# Patient Record
Sex: Female | Born: 1945 | Race: Black or African American | Hispanic: No | Smoking: Never smoker
Health system: Southern US, Community
[De-identification: ages and names within clinical notes are randomized; demographics above are authoritative.]

## PROBLEM LIST (undated history)

## (undated) DIAGNOSIS — C50919 Malignant neoplasm of unspecified site of unspecified female breast: Secondary | ICD-10-CM

## (undated) DIAGNOSIS — I10 Essential (primary) hypertension: Secondary | ICD-10-CM

## (undated) DIAGNOSIS — E785 Hyperlipidemia, unspecified: Secondary | ICD-10-CM

## (undated) DIAGNOSIS — K219 Gastro-esophageal reflux disease without esophagitis: Secondary | ICD-10-CM

## (undated) DIAGNOSIS — M1990 Unspecified osteoarthritis, unspecified site: Secondary | ICD-10-CM

## (undated) DIAGNOSIS — F32A Depression, unspecified: Secondary | ICD-10-CM

## (undated) DIAGNOSIS — E039 Hypothyroidism, unspecified: Secondary | ICD-10-CM

## (undated) HISTORY — PX: CHOLECYSTECTOMY: SHX55

## (undated) HISTORY — PX: OTHER SURGICAL HISTORY: SHX169

---

## 1980-12-06 HISTORY — PX: ABDOMINAL HYSTERECTOMY: SHX81

## 1998-12-15 ENCOUNTER — Ambulatory Visit (HOSPITAL_COMMUNITY): Admission: RE | Admit: 1998-12-15 | Discharge: 1998-12-15 | Payer: Self-pay | Admitting: *Deleted

## 1999-06-10 ENCOUNTER — Ambulatory Visit (HOSPITAL_COMMUNITY): Admission: RE | Admit: 1999-06-10 | Discharge: 1999-06-10 | Payer: Self-pay | Admitting: Gastroenterology

## 1999-12-09 ENCOUNTER — Other Ambulatory Visit: Admission: RE | Admit: 1999-12-09 | Discharge: 1999-12-09 | Payer: Self-pay | Admitting: Obstetrics and Gynecology

## 2000-08-16 ENCOUNTER — Emergency Department (HOSPITAL_COMMUNITY): Admission: EM | Admit: 2000-08-16 | Discharge: 2000-08-16 | Payer: Self-pay | Admitting: Emergency Medicine

## 2000-09-26 ENCOUNTER — Encounter: Payer: Self-pay | Admitting: Gastroenterology

## 2000-09-26 ENCOUNTER — Encounter: Admission: RE | Admit: 2000-09-26 | Discharge: 2000-09-26 | Payer: Self-pay | Admitting: Gastroenterology

## 2000-10-21 ENCOUNTER — Ambulatory Visit (HOSPITAL_COMMUNITY): Admission: RE | Admit: 2000-10-21 | Discharge: 2000-10-21 | Payer: Self-pay | Admitting: Gastroenterology

## 2001-01-03 ENCOUNTER — Other Ambulatory Visit: Admission: RE | Admit: 2001-01-03 | Discharge: 2001-01-03 | Payer: Self-pay | Admitting: Obstetrics and Gynecology

## 2012-12-18 ENCOUNTER — Other Ambulatory Visit (HOSPITAL_COMMUNITY): Payer: Self-pay | Admitting: *Deleted

## 2012-12-19 ENCOUNTER — Encounter (HOSPITAL_COMMUNITY): Payer: Self-pay

## 2017-12-06 HISTORY — PX: OTHER SURGICAL HISTORY: SHX169

## 2018-12-06 DIAGNOSIS — C50412 Malignant neoplasm of upper-outer quadrant of left female breast: Secondary | ICD-10-CM | POA: Insufficient documentation

## 2021-01-20 DIAGNOSIS — Z8673 Personal history of transient ischemic attack (TIA), and cerebral infarction without residual deficits: Secondary | ICD-10-CM | POA: Insufficient documentation

## 2021-01-20 DIAGNOSIS — I10 Essential (primary) hypertension: Secondary | ICD-10-CM | POA: Insufficient documentation

## 2021-01-20 DIAGNOSIS — Z853 Personal history of malignant neoplasm of breast: Secondary | ICD-10-CM | POA: Insufficient documentation

## 2021-01-20 DIAGNOSIS — R002 Palpitations: Secondary | ICD-10-CM | POA: Insufficient documentation

## 2021-02-02 ENCOUNTER — Other Ambulatory Visit: Payer: Self-pay

## 2021-02-02 ENCOUNTER — Emergency Department (HOSPITAL_COMMUNITY): Payer: Medicare PPO

## 2021-02-02 ENCOUNTER — Encounter (HOSPITAL_COMMUNITY): Payer: Self-pay | Admitting: Emergency Medicine

## 2021-02-02 ENCOUNTER — Emergency Department (HOSPITAL_COMMUNITY)
Admission: EM | Admit: 2021-02-02 | Discharge: 2021-02-02 | Disposition: A | Discharge status: Home / Self Care | Payer: Medicare PPO | Attending: Emergency Medicine | Admitting: Emergency Medicine

## 2021-02-02 DIAGNOSIS — E875 Hyperkalemia: Secondary | ICD-10-CM | POA: Diagnosis not present

## 2021-02-02 DIAGNOSIS — Z91040 Latex allergy status: Secondary | ICD-10-CM | POA: Insufficient documentation

## 2021-02-02 DIAGNOSIS — I10 Essential (primary) hypertension: Secondary | ICD-10-CM

## 2021-02-02 DIAGNOSIS — R519 Headache, unspecified: Secondary | ICD-10-CM | POA: Insufficient documentation

## 2021-02-02 DIAGNOSIS — Z79899 Other long term (current) drug therapy: Secondary | ICD-10-CM | POA: Diagnosis not present

## 2021-02-02 HISTORY — DX: Essential (primary) hypertension: I10

## 2021-02-02 LAB — COMPREHENSIVE METABOLIC PANEL
ALT: 26 U/L (ref 0–44)
AST: 52 U/L — ABNORMAL HIGH (ref 15–41)
Albumin: 3.8 g/dL (ref 3.5–5.0)
Alkaline Phosphatase: 51 U/L (ref 38–126)
Anion gap: 12 (ref 5–15)
BUN: 12 mg/dL (ref 8–23)
CO2: 21 mmol/L — ABNORMAL LOW (ref 22–32)
Calcium: 9.1 mg/dL (ref 8.9–10.3)
Chloride: 101 mmol/L (ref 98–111)
Creatinine, Ser: 1.01 mg/dL — ABNORMAL HIGH (ref 0.44–1.00)
GFR, Estimated: 58 mL/min — ABNORMAL LOW (ref >60)
Glucose, Bld: 96 mg/dL (ref 70–99)
Potassium: 5.2 mmol/L — ABNORMAL HIGH (ref 3.5–5.1)
Sodium: 134 mmol/L — ABNORMAL LOW (ref 135–145)
Total Bilirubin: 1.4 mg/dL — ABNORMAL HIGH (ref 0.3–1.2)
Total Protein: 7.1 g/dL (ref 6.5–8.1)

## 2021-02-02 LAB — CBC WITH DIFFERENTIAL/PLATELET
Abs Immature Granulocytes: 0.03 10*3/uL (ref 0.00–0.07)
Basophils Absolute: 0 10*3/uL (ref 0.0–0.1)
Basophils Relative: 0 %
Eosinophils Absolute: 0.2 10*3/uL (ref 0.0–0.5)
Eosinophils Relative: 2 %
HCT: 34.8 % — ABNORMAL LOW (ref 36.0–46.0)
Hemoglobin: 11.9 g/dL — ABNORMAL LOW (ref 12.0–15.0)
Immature Granulocytes: 0 %
Lymphocytes Relative: 12 %
Lymphs Abs: 1.2 10*3/uL (ref 0.7–4.0)
MCH: 29.8 pg (ref 26.0–34.0)
MCHC: 34.2 g/dL (ref 30.0–36.0)
MCV: 87.2 fL (ref 80.0–100.0)
Monocytes Absolute: 0.9 10*3/uL (ref 0.1–1.0)
Monocytes Relative: 9 %
Neutro Abs: 7.7 10*3/uL (ref 1.7–7.7)
Neutrophils Relative %: 77 %
Platelets: 192 10*3/uL (ref 150–400)
RBC: 3.99 MIL/uL (ref 3.87–5.11)
RDW: 13.7 % (ref 11.5–15.5)
WBC: 10 10*3/uL (ref 4.0–10.5)
nRBC: 0 % (ref 0.0–0.2)

## 2021-02-02 LAB — URINALYSIS, ROUTINE W REFLEX MICROSCOPIC
Bilirubin Urine: NEGATIVE
Glucose, UA: NEGATIVE mg/dL
Hgb urine dipstick: NEGATIVE
Ketones, ur: NEGATIVE mg/dL
Leukocytes,Ua: NEGATIVE
Nitrite: NEGATIVE
Protein, ur: NEGATIVE mg/dL
Specific Gravity, Urine: 1.005 (ref 1.005–1.030)
pH: 7 (ref 5.0–8.0)

## 2021-02-02 MED ORDER — LABETALOL HCL 5 MG/ML IV SOLN
10.0000 mg | Freq: Once | INTRAVENOUS | Status: AC
  Administered 2021-02-02: 10 mg via INTRAVENOUS
  Filled 2021-02-02: qty 4

## 2021-02-02 MED ORDER — ONDANSETRON HCL 4 MG/2ML IJ SOLN
4.0000 mg | Freq: Once | INTRAMUSCULAR | Status: AC
  Administered 2021-02-02: 4 mg via INTRAVENOUS
  Filled 2021-02-02: qty 2

## 2021-02-02 MED ORDER — SODIUM CHLORIDE 0.9 % IV BOLUS
500.0000 mL | Freq: Once | INTRAVENOUS | Status: AC
  Administered 2021-02-02: 500 mL via INTRAVENOUS

## 2021-02-02 MED ORDER — METOPROLOL SUCCINATE ER 50 MG PO TB24: 50.0000 mg | ORAL_TABLET | Freq: Every day | ORAL | 0 refills | Status: DC

## 2021-02-02 MED ORDER — ACETAMINOPHEN 500 MG PO TABS
1000.0000 mg | ORAL_TABLET | Freq: Once | ORAL | Status: AC
  Administered 2021-02-02: 1000 mg via ORAL
  Filled 2021-02-02: qty 2

## 2021-02-02 MED ORDER — LABETALOL HCL 5 MG/ML IV SOLN
20.0000 mg | Freq: Once | INTRAVENOUS | Status: AC
  Administered 2021-02-02: 20 mg via INTRAVENOUS
  Filled 2021-02-02: qty 4

## 2021-02-02 NOTE — ED Triage Notes (Signed)
Patient coming from home. Endorses increased blood pressure for a few days. States that she does take BP medications at home. NAD.

## 2021-02-02 NOTE — ED Provider Notes (Signed)
[facility] EMERGENCY DEPARTMENT Provider Note   CSN: [id number] Arrival date & time: 02/02/21  5027     History Chief Complaint  Patient presents with  . Hypertension    Cindy Huerta is a 75 y.o. female with PMHx HTN who presents to the ED today with complaint of gradual onset, constant, throbbing, diffuse headache x 2-3 days. Son is at bedside - reports that pt went and saw a new PCP last week who wanted to get her blood pressure under more control as it was elevated in the 741O systolic. Son is unsure however if pt was started on a new medication or had her medications increased. He states that over the weekend pt began complaining of a headache that has been unrelieved with Tylenol; last took 1,000 mg Tylenol earlier this morning around 5 AM. Pt has also been vomiting. Son states that pt's blood pressure has continued to rise prompting concern - they were supposed to call the PCP this morning to discuss what her numbers looked like over the weekend however decided to come to the ED instead. Son does mention that pt's speech seems slower than normal however he states this typically happens when she begins having a headache - LKN before lunch time yesterday. Pt denies fevers, chills, neck stiffness, rash, unilateral weakness or numbness, facial droop, vision changes, or any other associated symptoms.   Per paperwork brought by son: Pt was seen at PCP's office on 2/19 with blood pressure 160/80. She was taken off of Cartia xr, Lisinopril, and triamterine hctz and changed to metoprolol 25 mg with her regular dose of 80 mg telmisartan.    The history is provided by the patient, a relative and medical records.       Past Medical History:  Diagnosis Date  . Hypertension     There are no problems to display for this patient.   History reviewed. No pertinent surgical history.   OB History   No obstetric history on file.     No family history on file.      Home Medications Prior to Admission medications   Medication Sig Start Date End Date Taking? Authorizing Provider  acetaminophen (TYLENOL) 500 MG tablet Take 500-1,000 mg by mouth as needed for pain.   Yes [provider]  aspirin 81 MG EC tablet Take 81 mg by mouth daily.   Yes [provider]  atorvastatin (LIPITOR) 80 MG tablet Take 80 mg by mouth daily. 01/15/20  Yes [provider]  Cholecalciferol 25 MCG (1000 UT) capsule Take 1,000 Units by mouth daily.   Yes [provider]  clopidogrel (PLAVIX) 75 MG tablet Take 75 mg by mouth daily. 11/23/19  Yes [provider]  Cyanocobalamin (VITAMIN B-12 PO) Take 1 tablet by mouth daily.   Yes [provider]  fexofenadine (ALLEGRA) 180 MG tablet Take 180 mg by mouth as needed for allergies.   Yes [provider]  levothyroxine (SYNTHROID) 88 MCG tablet Take 88 mcg by mouth every morning. 12/03/19  Yes [provider]  magnesium oxide (MAG-OX) 400 MG tablet Take 400 mg by mouth 2 (two) times daily.   Yes [provider]  meclizine (ANTIVERT) 12.5 MG tablet Take 12.5 mg by mouth as needed for dizziness.   Yes [provider]  metoprolol succinate (TOPROL XL) 50 MG 24 hr tablet Take 1 tablet (50 mg total) by mouth daily for 7 days. Take with or immediately following a meal. 02/02/21 02/09/21  Yes Alroy Bailiff, Margaux, PA-C  metoprolol succinate (TOPROL-XL) 25 MG 24 hr tablet Take 25 mg by mouth daily. 01/20/21  Yes [provider]  Multiple Vitamins-Minerals (THERA-M) TABS Take 1 tablet by mouth daily.   Yes [provider]  ondansetron (ZOFRAN-ODT) 4 MG disintegrating tablet Take 4 mg by mouth as needed for nausea/vomiting. 12/16/20  Yes [provider]  pantoprazole (PROTONIX) 40 MG tablet Take 40 mg by mouth daily.   Yes [provider]  potassium chloride SA (KLOR-CON) 20 MEQ tablet Take 20 mEq by mouth daily.   Yes [provider]  telmisartan (MICARDIS) 80 MG tablet Take 80 mg by mouth daily. 10/06/18  Yes [provider]    Allergies    Latex, Lisinopril, Procaine, Diphenhydramine hcl, Clarithromycin, and Oxycodone  Review of Systems   Review of Systems  Constitutional: Negative for chills and fever.  Eyes: Negative for visual disturbance.  Gastrointestinal: Positive for nausea and vomiting. Negative for abdominal pain.  Musculoskeletal: Negative for neck pain and neck stiffness.  Skin: Negative for rash.  Neurological: Positive for speech difficulty and headaches. Negative for syncope.  All other systems reviewed and are negative.   Physical Exam Updated Vital Signs BP (!) 189/80 (BP Location: Right Arm)   Pulse 80   Temp 97.8 F (36.6 C) (Oral)   Resp 18   Ht 5\' 2"  (1.575 m)   Wt 79.4 kg   SpO2 99%   BMI 32.01 kg/m   Physical Exam Vitals and nursing note reviewed.  Constitutional:      Appearance: She is not ill-appearing.  HENT:     Head: Normocephalic and atraumatic.  Eyes:     Extraocular Movements: Extraocular movements intact.     Conjunctiva/sclera: Conjunctivae normal.     Pupils: Pupils are equal, round, and reactive to light.  Cardiovascular:     Rate and Rhythm: Normal rate and regular rhythm.     Pulses: Normal pulses.  Pulmonary:     Effort: Pulmonary effort is normal.     Breath sounds: Normal breath sounds. No wheezing, rhonchi or rales.  Abdominal:     Palpations: Abdomen is soft.     Tenderness: There is no abdominal tenderness. There is no guarding or rebound.  Musculoskeletal:     Cervical back: Neck supple. No rigidity.  Skin:    General: Skin is warm and dry.  Neurological:     Mental Status: She is alert.     Comments: Alert and oriented to self, place, time and event.   Speech is slowed.  Strength 5/5 in upper/lower extremities  Sensation intact in upper/lower extremities   Negative Romberg. No pronator drift.  Normal  finger-to-nose and feet tapping.  CN I not tested  CN II grossly intact visual fields bilaterally. Did not visualize posterior eye.   CN III, IV, VI PERRLA and EOMs intact bilaterally  CN V Intact sensation to sharp and light touch to the face  CN VII facial movements symmetric  CN VIII not tested  CN IX, X no uvula deviation, symmetric rise of soft palate  CN XI 5/5 SCM and trapezius strength bilaterally  CN XII Midline tongue protrusion, symmetric L/R movements      ED Results / Procedures / Treatments   Labs (all labs ordered are listed, but only abnormal results are displayed) Labs Reviewed  COMPREHENSIVE METABOLIC PANEL - Abnormal; Notable for the following components:      Result Value   Sodium 134 (*)  Potassium 5.2 (*)    CO2 21 (*)    Creatinine, Ser 1.01 (*)    AST 52 (*)    Total Bilirubin 1.4 (*)    GFR, Estimated 58 (*)    All other components within normal limits  URINALYSIS, ROUTINE W REFLEX MICROSCOPIC - Abnormal; Notable for the following components:   Color, Urine STRAW (*)    All other components within normal limits  CBC WITH DIFFERENTIAL/PLATELET - Abnormal; Notable for the following components:   Hemoglobin 11.9 (*)    HCT 34.8 (*)    All other components within normal limits  CBC WITH DIFFERENTIAL/PLATELET    EKG EKG Interpretation  Date/Time:  Monday February 02 2021 09:19:39 EST Ventricular Rate:  63 PR Interval:    QRS Duration: 93 QT Interval:  419 QTC Calculation: 433 R Axis:   8 Text Interpretation: Sinus rhythm Low voltage, precordial leads Borderline T abnormalities, anterior leads Confirmed by Elnora Morrison [phone]) on 02/02/2021 9:49:27 AM   Radiology CT Head Wo Contrast  Result Date: 02/02/2021 CLINICAL DATA:  Headache, hypertension EXAM: CT HEAD WITHOUT CONTRAST TECHNIQUE: Contiguous axial images were obtained from the base of the skull through the vertex without intravenous contrast. COMPARISON:  None. FINDINGS: Brain: No  evidence of acute infarction, hemorrhage, hydrocephalus, extra-axial collection or mass lesion/mass effect. 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 Mild low-density changes within the periventricular and subcortical white matter compatible with chronic microvascular ischemic change. Mild diffuse cerebral volume loss. Vascular: Atherosclerotic calcifications involving the large vessels of the skull base. No unexpected hyperdense vessel. Skull: Normal. Negative for fracture or focal lesion. Sinuses/Orbits: No acute finding. Other: None. IMPRESSION: 1. No acute intracranial findings. 2. Mild chronic microvascular ischemic change and cerebral volume loss. Electronically Signed   By: Davina Poke D.O.   On: 02/02/2021 11:21    Procedures Procedures   Medications Ordered in ED Medications  sodium chloride 0.9 % bolus 500 mL (0 mLs Intravenous Stopped 02/02/21 1208)  ondansetron (ZOFRAN) injection 4 mg (4 mg Intravenous Given 02/02/21 1051)  acetaminophen (TYLENOL) tablet 1,000 mg (1,000 mg Oral Given 02/02/21 1051)  labetalol (NORMODYNE) injection 10 mg (10 mg Intravenous Given 02/02/21 1048)  labetalol (NORMODYNE) injection 20 mg (20 mg Intravenous Given 02/02/21 1251)    ED Course  I have reviewed the triage vital signs and the nursing notes.  Pertinent labs & imaging results that were available during my care of the patient were reviewed by me and considered in my medical decision making (see chart for details).    MDM Rules/Calculators/A&P                          75 year old female presenting to the ED today with complaint of throbbing headache and elevated blood pressure readings with associated nausea and NBNB emesis this past weekend. Son has been giving Tylenol without relief, brought pt in today for further evaluation. On arrival to the ED BP elevated 189/80; remainder of vital signs are stable. Pt's speech does appear slowed at this time - son reports this typically will  happen when her blood pressure gets elevated. LKN prior to lunch time yesterday; no other signs of acute CVA per son. On exam pt has no focal neuro deficits on exam. She is following commands without difficulty however given age and HA with elevated BP will plan for CT Head. Will also obtain labs and provide small amount of fluids with 1  g Tylenol, 10 mg Labetalol, and 4 mg Zofran. Discussed case with attending physician Dr. Reather Converse who agrees with plan.   CBC without leukocytosis. Hgb stable at 11.9 CMP with potassium 5.2; pt does take 20 mEq KDUR daily which I suspect is the cause of her hyperkalemia today. Sodium 134, bicarb 21. Creatinine slightly elevated at 1.01; do not have previous to compare to U/A negative for infection  CT Head negative  On reevaluation pt reports her headache is somewhat improved however blood pressure continues to be elevated now 199/64. Will provide an additional 20 mg Labetalol and reevaluate. Pt at first told me she did take her morning dose of BP meds today however now she is unsure.   Blood pressure after additional labetalol 166/73 which is around pt's baseline 2 weeks ago when her BP meds were changed. On reevaluation pt resting comfortably; reports she has been able to sleep some due to her headache having relief. She has also been able to tolerate PO without vomiting. Pt has been able to ambulate to the restroom with steady gait as well. Will plan to discharge home at this time with close PCP eval by the end of the week. I do think pt's BP meds need to be increased as she is only on 25 mg metoprolol daily; have discussed this with attending physician Dr. Reather Converse who agrees to increase metoprolol until pt can see her PCP this week. Have discussed with pt's son who is in agreement with plan. Pt stable for discharge.   This note was prepared using Dragon voice recognition software and may include unintentional dictation errors due to the inherent limitations of voice  recognition software.   Final Clinical Impression(s) / ED Diagnoses Final diagnoses:  Primary hypertension  Acute nonintractable headache, unspecified headache type  Hyperkalemia    Rx / DC Orders ED Discharge Orders         Ordered    metoprolol succinate (TOPROL XL) 50 MG 24 hr tablet  Daily        02/02/21 1424           Discharge Instructions     Please pick up new blood pressure medication and take as prescribed. Keep checking your blood pressures daily and keep a log.   Stop taking your potassium supplement [facility]) for the next week until you can see your PCP. You will need to see your PCP by the end of the week for blood pressure reevaluation and to have your potassium level rechecked as well.   Drink plenty of fluids to stay hydrated. Take Tylenol as needed if your headache returns.   Return to the ED IMMEDIATELY for any worsening symptoms       Eustaquio Maize, PA-C 02/02/21 1426    Elnora Morrison, MD 02/03/21 [phone]

## 2021-02-02 NOTE — Discharge Instructions (Addendum)
Please pick up new blood pressure medication and take as prescribed. Keep checking your blood pressures daily and keep a log.   Stop taking your potassium supplement [facility]) for the next week until you can see your PCP. You will need to see your PCP by the end of the week for blood pressure reevaluation and to have your potassium level rechecked as well.   Drink plenty of fluids to stay hydrated. Take Tylenol as needed if your headache returns.   Return to the ED IMMEDIATELY for any worsening symptoms

## 2021-02-24 ENCOUNTER — Other Ambulatory Visit: Payer: Self-pay

## 2021-02-24 ENCOUNTER — Emergency Department
Admission: EM | Admit: 2021-02-24 | Discharge: 2021-02-24 | Disposition: A | Discharge status: Home / Self Care | Payer: Medicare PPO | Attending: Emergency Medicine | Admitting: Emergency Medicine

## 2021-02-24 DIAGNOSIS — Z7982 Long term (current) use of aspirin: Secondary | ICD-10-CM | POA: Diagnosis not present

## 2021-02-24 DIAGNOSIS — Z79899 Other long term (current) drug therapy: Secondary | ICD-10-CM | POA: Diagnosis not present

## 2021-02-24 DIAGNOSIS — I10 Essential (primary) hypertension: Secondary | ICD-10-CM | POA: Insufficient documentation

## 2021-02-24 DIAGNOSIS — R519 Headache, unspecified: Secondary | ICD-10-CM | POA: Diagnosis present

## 2021-02-24 DIAGNOSIS — Z91040 Latex allergy status: Secondary | ICD-10-CM | POA: Insufficient documentation

## 2021-02-24 DIAGNOSIS — Z7902 Long term (current) use of antithrombotics/antiplatelets: Secondary | ICD-10-CM | POA: Diagnosis not present

## 2021-02-24 LAB — CBC WITH DIFFERENTIAL/PLATELET
Abs Immature Granulocytes: 0.04 10*3/uL (ref 0.00–0.07)
Basophils Absolute: 0 10*3/uL (ref 0.0–0.1)
Basophils Relative: 0 %
Eosinophils Absolute: 0.1 10*3/uL (ref 0.0–0.5)
Eosinophils Relative: 1 %
HCT: 37.8 % (ref 36.0–46.0)
Hemoglobin: 12.6 g/dL (ref 12.0–15.0)
Immature Granulocytes: 0 %
Lymphocytes Relative: 13 %
Lymphs Abs: 1.5 10*3/uL (ref 0.7–4.0)
MCH: 29.4 pg (ref 26.0–34.0)
MCHC: 33.3 g/dL (ref 30.0–36.0)
MCV: 88.3 fL (ref 80.0–100.0)
Monocytes Absolute: 1 10*3/uL (ref 0.1–1.0)
Monocytes Relative: 8 %
Neutro Abs: 8.8 10*3/uL — ABNORMAL HIGH (ref 1.7–7.7)
Neutrophils Relative %: 78 %
Platelets: 244 10*3/uL (ref 150–400)
RBC: 4.28 MIL/uL (ref 3.87–5.11)
RDW: 13.6 % (ref 11.5–15.5)
WBC: 11.5 10*3/uL — ABNORMAL HIGH (ref 4.0–10.5)
nRBC: 0 % (ref 0.0–0.2)

## 2021-02-24 LAB — BASIC METABOLIC PANEL
Anion gap: 10 (ref 5–15)
BUN: 10 mg/dL (ref 8–23)
CO2: 27 mmol/L (ref 22–32)
Calcium: 9.4 mg/dL (ref 8.9–10.3)
Chloride: 100 mmol/L (ref 98–111)
Creatinine, Ser: 0.97 mg/dL (ref 0.44–1.00)
GFR, Estimated: >60 mL/min (ref >60)
Glucose, Bld: 99 mg/dL (ref 70–99)
Potassium: 3.5 mmol/L (ref 3.5–5.1)
Sodium: 137 mmol/L (ref 135–145)

## 2021-02-24 LAB — TROPONIN I (HIGH SENSITIVITY): Troponin I (High Sensitivity): 7 ng/L (ref <18)

## 2021-02-24 MED ORDER — LORAZEPAM 2 MG/ML IJ SOLN: 1.0000 mg | Freq: Once | INTRAMUSCULAR | Status: DC

## 2021-02-24 MED ORDER — LORAZEPAM 2 MG/ML IJ SOLN
1.0000 mg | Freq: Once | INTRAMUSCULAR | Status: AC
  Administered 2021-02-24: 1 mg via INTRAVENOUS
  Filled 2021-02-24: qty 1

## 2021-02-24 NOTE — ED Provider Notes (Signed)
[facility] Emergency Department Provider Note   ____________________________________________   Event Date/Time   First MD Initiated Contact with Patient 02/24/21 1021     (approximate)  I have reviewed the triage vital signs and the nursing notes.   HISTORY  Chief Complaint Headache and Hypertension    HPI Cindy Huerta is a 75 y.o. female with a stated past medical history of hypertension and anxiety who presents for hypertension and a posterior headache that began approximately 3 days prior to arrival.  Patient states that she took her blood pressure earlier this morning and was concerned as it was elevated with her systolics in the 947S.  Patient describes this posterior headache as an aching/throbbing, 10/10, nonradiating pain that is worse when her blood pressure is elevated.  Patient currently denies any vision changes, tinnitus, difficulty speaking, facial droop, sore throat, chest pain, shortness of breath, abdominal pain, nausea/vomiting/diarrhea, dysuria, or weakness/numbness/paresthesias in any extremity         Past Medical History:  Diagnosis Date  . Hypertension     There are no problems to display for this patient.   History reviewed. No pertinent surgical history.  Prior to Admission medications   Medication Sig Start Date End Date Taking? Authorizing Provider  acetaminophen (TYLENOL) 500 MG tablet Take 500-1,000 mg by mouth as needed for pain.    [provider]  aspirin 81 MG EC tablet Take 81 mg by mouth daily.    [provider]  atorvastatin (LIPITOR) 80 MG tablet Take 80 mg by mouth daily. 01/15/20   [provider]  Cholecalciferol 25 MCG (1000 UT) capsule Take 1,000 Units by mouth daily.    [provider]  clopidogrel (PLAVIX) 75 MG tablet Take 75 mg by mouth daily. 11/23/19   [provider]  Cyanocobalamin (VITAMIN B-12 PO) Take 1 tablet by mouth daily.    [provider]  fexofenadine (ALLEGRA) 180 MG tablet Take 180 mg by mouth as needed for allergies.    [provider]  levothyroxine (SYNTHROID) 88 MCG tablet Take 88 mcg by mouth every morning. 12/03/19   [provider]  magnesium oxide (MAG-OX) 400 MG tablet Take 400 mg by mouth 2 (two) times daily.    [provider]  meclizine (ANTIVERT) 12.5 MG tablet Take 12.5 mg by mouth as needed for dizziness.    [provider]  metoprolol succinate (TOPROL XL) 50 MG 24 hr tablet Take 1 tablet (50 mg total) by mouth daily for 7 days. Take with or immediately following a meal. 02/02/21 02/09/21  Alroy Bailiff, Margaux, PA-C  metoprolol succinate (TOPROL-XL) 25 MG 24 hr tablet Take 25 mg by mouth daily. 01/20/21   [provider]  Multiple Vitamins-Minerals (THERA-M) TABS Take 1 tablet by mouth daily.    [provider]  ondansetron (ZOFRAN-ODT) 4 MG disintegrating tablet Take 4 mg by mouth as needed for nausea/vomiting. 12/16/20   [provider]  pantoprazole (PROTONIX) 40 MG tablet Take 40 mg by mouth daily.    [provider]  potassium chloride SA (KLOR-CON) 20 MEQ tablet Take 20 mEq by mouth daily.    [provider]  telmisartan (MICARDIS) 80 MG tablet Take 80 mg by mouth daily. 10/06/18   [provider]    Allergies Latex, Lisinopril, Procaine, Diphenhydramine hcl, Clarithromycin, and Oxycodone  History reviewed. No pertinent family history.  Social History Social History   Tobacco Use  . Smoking status: Never Smoker  .  Smokeless tobacco: Never Used    Review of Systems Constitutional: No fever/chills Eyes: No visual changes. ENT: No sore throat. Cardiovascular: Denies chest pain. Respiratory: Denies shortness of breath. Gastrointestinal: No abdominal pain.  No nausea, no vomiting.  No diarrhea. Genitourinary: Negative for dysuria. Musculoskeletal: Negative for acute arthralgias Skin: Negative for  rash. Neurological: Positive for headaches, negative for weakness/numbness/paresthesias in any extremity Psychiatric: Negative for suicidal ideation/homicidal ideation   ____________________________________________   PHYSICAL EXAM:  VITAL SIGNS: ED Triage Vitals  Enc Vitals Group     BP 02/24/21 1018 (!) 205/103     Pulse Rate 02/24/21 1018 63     Resp 02/24/21 1018 20     Temp 02/24/21 1018 98.6 F (37 C)     Temp Source 02/24/21 1018 Oral     SpO2 02/24/21 1018 98 %     Weight 02/24/21 1014 180 lb (81.6 kg)     Height 02/24/21 1014 5\' 7"  (1.702 m)     Head Circumference --      Peak Flow --      Pain Score 02/24/21 1013 10     Pain Loc --      Pain Edu? --      Excl. in [place]? --    Constitutional: Alert and oriented. Well appearing and in no acute distress. Eyes: Conjunctivae are normal. PERRL. Head: Atraumatic. Nose: No congestion/rhinnorhea. Mouth/Throat: Mucous membranes are moist. Neck: No stridor Cardiovascular: Grossly normal heart sounds.  Good peripheral circulation. Respiratory: Normal respiratory effort.  No retractions. Gastrointestinal: Soft and nontender. No distention. Musculoskeletal: No obvious deformities Neurologic:  Normal speech and language. No gross focal neurologic deficits are appreciated. Skin:  Skin is warm and dry. No rash noted. Psychiatric: Mood and affect are normal. Speech and behavior are normal.  ____________________________________________   LABS (all labs ordered are listed, but only abnormal results are displayed)  Labs Reviewed  CBC WITH DIFFERENTIAL/PLATELET - Abnormal; Notable for the following components:      Result Value   WBC 11.5 (*)    Neutro Abs 8.8 (*)    All other components within normal limits  BASIC METABOLIC PANEL  TROPONIN I (HIGH SENSITIVITY)  TROPONIN I (HIGH SENSITIVITY)   ____________________________________________  EKG  ED ECG REPORT I, Naaman Plummer, the attending physician, personally viewed  and interpreted this ECG.  Date: 02/24/2021 EKG Time: 1018 Rate: 65 Rhythm: normal sinus rhythm QRS Axis: normal Intervals: normal ST/T Wave abnormalities: normal Narrative Interpretation: no evidence of acute ischemia   PROCEDURES  Procedure(s) performed (including Critical Care):  .1-3 Lead EKG Interpretation Performed by: Naaman Plummer, MD Authorized by: Naaman Plummer, MD     Interpretation: normal     ECG rate:  68   ECG rate assessment: normal     Rhythm: sinus rhythm     Ectopy: none     Conduction: normal       ____________________________________________   INITIAL IMPRESSION / ASSESSMENT AND PLAN / ED COURSE  As part of my medical decision making, I reviewed the following data within the [place] notes reviewed and incorporated, Labs reviewed, EKG interpreted, Old chart reviewed, and Notes from prior ED visits reviewed and incorporated      Presents to the emergency department complaining of high blood pressure. Patient is otherwise asymptomatic without confusion, chest pain, hematuria, or SOB. - Nonadherence to antihypertensive regimen DDx: CV, AMI, heart failure, renal infarction or failure or other end organ damage.  Disposition:  Discussed with patient their elevated blood pressure and need for close outpatient management of their hypertension. Will provide a prescription for the patients previous antihypertensive medication and arrange for the patient to follow up in a primary care clinic      ____________________________________________   FINAL CLINICAL IMPRESSION(S) / ED DIAGNOSES  Final diagnoses:  Primary hypertension  Nonintractable episodic headache, unspecified headache type     ED Discharge Orders    None       Note:  This document was prepared using Dragon voice recognition software and may include unintentional dictation errors.   Naaman Plummer, MD 02/24/21 [phone]

## 2021-02-24 NOTE — ED Triage Notes (Signed)
Pt here for HA, HTN, and anxiety. Pt bp was elevated at her primary's. Pt has severe anxiety. Pt A&O on arrival to ED.

## 2021-02-24 NOTE — ED Notes (Signed)
Pt educated on discharge and wheeled to lobby to await son.

## 2021-02-28 ENCOUNTER — Emergency Department
Admission: EM | Admit: 2021-02-28 | Discharge: 2021-02-28 | Disposition: A | Discharge status: Home / Self Care | Payer: Medicare PPO | Attending: Emergency Medicine | Admitting: Emergency Medicine

## 2021-02-28 ENCOUNTER — Other Ambulatory Visit: Payer: Self-pay

## 2021-02-28 DIAGNOSIS — Z7982 Long term (current) use of aspirin: Secondary | ICD-10-CM | POA: Insufficient documentation

## 2021-02-28 DIAGNOSIS — K625 Hemorrhage of anus and rectum: Secondary | ICD-10-CM

## 2021-02-28 DIAGNOSIS — Z79899 Other long term (current) drug therapy: Secondary | ICD-10-CM | POA: Insufficient documentation

## 2021-02-28 DIAGNOSIS — K648 Other hemorrhoids: Secondary | ICD-10-CM | POA: Insufficient documentation

## 2021-02-28 DIAGNOSIS — K644 Residual hemorrhoidal skin tags: Secondary | ICD-10-CM

## 2021-02-28 DIAGNOSIS — Z91040 Latex allergy status: Secondary | ICD-10-CM | POA: Diagnosis not present

## 2021-02-28 DIAGNOSIS — R110 Nausea: Secondary | ICD-10-CM | POA: Diagnosis not present

## 2021-02-28 DIAGNOSIS — I10 Essential (primary) hypertension: Secondary | ICD-10-CM | POA: Diagnosis not present

## 2021-02-28 LAB — CBC WITH DIFFERENTIAL/PLATELET
Abs Immature Granulocytes: 0.06 10*3/uL (ref 0.00–0.07)
Basophils Absolute: 0.1 10*3/uL (ref 0.0–0.1)
Basophils Relative: 0 %
Eosinophils Absolute: 0.2 10*3/uL (ref 0.0–0.5)
Eosinophils Relative: 1 %
HCT: 35.8 % — ABNORMAL LOW (ref 36.0–46.0)
Hemoglobin: 12.1 g/dL (ref 12.0–15.0)
Immature Granulocytes: 1 %
Lymphocytes Relative: 13 %
Lymphs Abs: 1.6 10*3/uL (ref 0.7–4.0)
MCH: 29.6 pg (ref 26.0–34.0)
MCHC: 33.8 g/dL (ref 30.0–36.0)
MCV: 87.5 fL (ref 80.0–100.0)
Monocytes Absolute: 0.9 10*3/uL (ref 0.1–1.0)
Monocytes Relative: 7 %
Neutro Abs: 10.3 10*3/uL — ABNORMAL HIGH (ref 1.7–7.7)
Neutrophils Relative %: 78 %
Platelets: 197 10*3/uL (ref 150–400)
RBC: 4.09 MIL/uL (ref 3.87–5.11)
RDW: 13.8 % (ref 11.5–15.5)
WBC: 13.1 10*3/uL — ABNORMAL HIGH (ref 4.0–10.5)
nRBC: 0 % (ref 0.0–0.2)

## 2021-02-28 LAB — TYPE AND SCREEN
ABO/RH(D): AB POS
Antibody Screen: NEGATIVE

## 2021-02-28 LAB — COMPREHENSIVE METABOLIC PANEL
ALT: 14 U/L (ref 0–44)
AST: 22 U/L (ref 15–41)
Albumin: 4.1 g/dL (ref 3.5–5.0)
Alkaline Phosphatase: 52 U/L (ref 38–126)
Anion gap: 9 (ref 5–15)
BUN: 11 mg/dL (ref 8–23)
CO2: 26 mmol/L (ref 22–32)
Calcium: 9.3 mg/dL (ref 8.9–10.3)
Chloride: 102 mmol/L (ref 98–111)
Creatinine, Ser: 0.96 mg/dL (ref 0.44–1.00)
GFR, Estimated: >60 mL/min (ref >60)
Glucose, Bld: 88 mg/dL (ref 70–99)
Potassium: 3.4 mmol/L — ABNORMAL LOW (ref 3.5–5.1)
Sodium: 137 mmol/L (ref 135–145)
Total Bilirubin: 0.6 mg/dL (ref 0.3–1.2)
Total Protein: 7.7 g/dL (ref 6.5–8.1)

## 2021-02-28 LAB — HEMOGLOBIN AND HEMATOCRIT, BLOOD
HCT: 37.4 % (ref 36.0–46.0)
Hemoglobin: 12.3 g/dL (ref 12.0–15.0)

## 2021-02-28 LAB — PROTIME-INR
INR: 1.2 (ref 0.8–1.2)
Prothrombin Time: 15.1 seconds (ref 11.4–15.2)

## 2021-02-28 LAB — LIPASE, BLOOD: Lipase: 24 U/L (ref 11–51)

## 2021-02-28 MED ORDER — HYDRALAZINE HCL 50 MG PO TABS
50.0000 mg | ORAL_TABLET | ORAL | Status: AC
  Administered 2021-02-28: 50 mg via ORAL
  Filled 2021-02-28: qty 1

## 2021-02-28 MED ORDER — METOPROLOL SUCCINATE ER 50 MG PO TB24
50.0000 mg | ORAL_TABLET | ORAL | Status: AC
  Administered 2021-02-28: 50 mg via ORAL
  Filled 2021-02-28: qty 1

## 2021-02-28 MED ORDER — ONDANSETRON HCL 4 MG/2ML IJ SOLN
4.0000 mg | Freq: Once | INTRAMUSCULAR | Status: AC
  Administered 2021-02-28: 4 mg via INTRAVENOUS
  Filled 2021-02-28: qty 2

## 2021-02-28 MED ORDER — HYDRALAZINE HCL 50 MG PO TABS: 25.0000 mg | ORAL_TABLET | Freq: Once | ORAL | Status: DC

## 2021-02-28 NOTE — ED Notes (Signed)
Called lab to draw a repeat H&H.

## 2021-02-28 NOTE — ED Notes (Signed)
Pt to bathroom after being placed in room 14- pt had a small BM- blood noted on toilet paper after wiping but none noted in stool or toilet bowl

## 2021-02-28 NOTE — ED Notes (Signed)
Pt provided crackers and applesauce.

## 2021-02-28 NOTE — ED Notes (Signed)
RN went in to room to DC pt and pt dry heaving. Provided water to drink. Son states she hasn't eaten today or had her normal nausea meds.

## 2021-02-28 NOTE — ED Triage Notes (Signed)
Pt states she was having some rectal bleeding this AM- pt states she was having bright red blood and there was a large amount when she wiped- pt states she has also been feeling nauseous but this is an "off and on" problem- pt has zofran at home

## 2021-02-28 NOTE — ED Notes (Signed)
Pt requesting something to drink. Will check with MD regarding NPO status.
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
## 2021-02-28 NOTE — ED Notes (Signed)
Assisted pt to the bed, warm blankets requested and given. No other needs voiced at this time.

## 2021-02-28 NOTE — ED Notes (Signed)
Lab at bedside attempting to collect blood work.

## 2021-02-28 NOTE — ED Notes (Signed)
Pt ambulatory to toilet with this RN to urinate.

## 2021-02-28 NOTE — ED Notes (Signed)
IV did not draw lab work as ordered. This RN attempted to draw blood from IV, IV does not pull back but will flush. Lab called to come draw labs on pt.

## 2021-02-28 NOTE — ED Provider Notes (Signed)
[facility] Emergency Department Provider Note ____________________________________________   Event Date/Time   First MD Initiated Contact with Patient 02/28/21 [phone]     (approximate)  I have reviewed the triage vital signs and the nursing notes.  HISTORY  Chief Complaint Rectal Bleeding  HPI Cindy Huerta is a 75 y.o. female   with a history of hypertension  Patient presents today, reports she use the bathroom this morning, after having a bowel movement she noticed she had small amount of blood on the tissue paper.  She comes for evaluation.  She notified her son.  She initially reports it was a large amount but when discussing with her she reports it was enough to cause a streak on the toilet paper.  She did not see actual blood in the stool.  She also has been having some nausea, but reports that something she has been taking Zofran for twice a day for as well.  She has not had her blood pressure medicines yet today  No abdominal pain.  No fevers or chills. She reports on and off nausea but again reports that is something that is been a longstanding problem  Does take aspirin and Plavix  Past Medical History:  Diagnosis Date  . Hypertension     There are no problems to display for this patient.   History reviewed. No pertinent surgical history.  Prior to Admission medications   Medication Sig Start Date End Date Taking? Authorizing Provider  aspirin 81 MG EC tablet Take 81 mg by mouth daily.   Yes [provider]  atorvastatin (LIPITOR) 80 MG tablet Take 80 mg by mouth daily. 01/15/20  Yes [provider]  Cholecalciferol 25 MCG (1000 UT) capsule Take 1,000 Units by mouth daily.   Yes [provider]  clopidogrel (PLAVIX) 75 MG tablet Take 75 mg by mouth daily. 11/23/19  Yes [provider]  Cyanocobalamin (VITAMIN B-12 PO) Take 1 tablet by mouth daily.   Yes [provider]  fexofenadine (ALLEGRA)  180 MG tablet Take 180 mg by mouth as needed for allergies.   Yes [provider]  hydrALAZINE (APRESOLINE) 25 MG tablet Take 25 mg by mouth 3 (three) times daily. 02/04/21  Yes [provider]  levothyroxine (SYNTHROID) 75 MCG tablet Take 75 mcg by mouth every morning. 02/10/21  Yes [provider]  LORazepam (ATIVAN) 0.5 MG tablet Take 0.25-0.5 mg by mouth every 12 (twelve) hours as needed for anxiety. 02/25/21  Yes [provider]  magnesium oxide (MAG-OX) 400 MG tablet Take 400 mg by mouth 2 (two) times daily.   Yes [provider]  Multiple Vitamins-Minerals (THERA-M) TABS Take 1 tablet by mouth daily.   Yes [provider]  pantoprazole (PROTONIX) 40 MG tablet Take 40 mg by mouth daily.   Yes [provider]  potassium chloride SA (KLOR-CON) 20 MEQ tablet Take 20 mEq by mouth daily.   Yes [provider]  sertraline (ZOLOFT) 25 MG tablet Take 25 mg by mouth daily. 02/16/21  Yes [provider]  telmisartan (MICARDIS) 80 MG tablet Take 80 mg by mouth daily. 10/06/18  Yes [provider]  acetaminophen (TYLENOL) 500 MG tablet Take 500-1,000 mg by mouth as needed for pain.    [provider]  meclizine (ANTIVERT) 12.5 MG tablet Take 12.5 mg by mouth as needed for dizziness.    [provider]  ondansetron (ZOFRAN-ODT) 4 MG disintegrating tablet Take 4 mg by mouth as  needed for nausea/vomiting. 12/16/20   [provider]    Allergies Latex, Lisinopril, Procaine, Diphenhydramine hcl, Clarithromycin, and Oxycodone  No family history on file.  Social History Social History   Tobacco Use  . Smoking status: Never Smoker  . Smokeless tobacco: Never Used    Review of Systems Constitutional: No fever/chills Eyes: No visual changes. Cardiovascular: Denies chest pain. Respiratory: Denies shortness of breath. Gastrointestinal: No abdominal pain.  See HPI.  Does report nausea and dry  heaving but again reports this to be a regular issue for her takes Zofran for at home normally Genitourinary: Negative for dysuria. Musculoskeletal: Negative for back pain. Skin: Negative for rash. Neurological: Negative for headaches, areas of focal weakness or numbness.    ____________________________________________   PHYSICAL EXAM:  VITAL SIGNS: ED Triage Vitals  Enc Vitals Group     BP 02/28/21 0759 (!) 203/73     Pulse Rate 02/28/21 0759 64     Resp 02/28/21 0759 18     Temp 02/28/21 0759 98 F (36.7 C)     Temp Source 02/28/21 0759 Oral     SpO2 02/28/21 0759 100 %     Weight 02/28/21 0804 180 lb (81.6 kg)     Height 02/28/21 0804 5\' 7"  (1.702 m)     Head Circumference --      Peak Flow --      Pain Score 02/28/21 0803 5     Pain Loc --      Pain Edu? --      Excl. in [place]? --     Constitutional: Alert and oriented. Well appearing and in no acute distress.  She does not appear in any acute distress.  She does appear somewhat anxious Eyes: Conjunctivae are normal. Head: Atraumatic. Nose: No congestion/rhinnorhea. Mouth/Throat: Mucous membranes are moist. Neck: No stridor.  Cardiovascular: Normal rate, regular rhythm. Grossly normal heart sounds.  Good peripheral circulation. Respiratory: Normal respiratory effort.  No retractions. Lungs CTAB. Gastrointestinal: Soft and nontender. No distention.  Rectal exam performed with nurse Levada Dy.  Hemoccult negative.  Control positive.  Patient does have a small external hemorrhoid, but does not appear to be actively thrombosed or bleeding.  A couple small specks of blood are noted in her underwear, but does not appear to be any larger gross amount of bleeding. Musculoskeletal: No lower extremity tenderness nor edema. Neurologic:  Normal speech and language. No gross focal neurologic deficits are appreciated.  Skin:  Skin is warm, dry and intact. No rash noted. Psychiatric: Mood and affect are normal. Speech and behavior are  normal.  ____________________________________________   LABS (all labs ordered are listed, but only abnormal results are displayed)  Labs Reviewed  COMPREHENSIVE METABOLIC PANEL - Abnormal; Notable for the following components:      Result Value   Potassium 3.4 (*)    All other components within normal limits  CBC WITH DIFFERENTIAL/PLATELET - Abnormal; Notable for the following components:   WBC 13.1 (*)    HCT 35.8 (*)    Neutro Abs 10.3 (*)    All other components within normal limits  LIPASE, BLOOD  PROTIME-INR  HEMOGLOBIN AND HEMATOCRIT, BLOOD  TYPE AND SCREEN   ____________________________________________  EKG   ____________________________________________  RADIOLOGY  No associate abdominal pain.  Painless report of bleeding.  Reassuring examination without peritonitis.  No fever, no signs or symptoms that suggest need for acute intra-abdominal imaging at this time. ____________________________________________   PROCEDURES  Procedure(s) performed: None  Procedures  Critical Care performed: No  ____________________________________________   INITIAL IMPRESSION / ASSESSMENT AND PLAN / ED COURSE  Pertinent labs & imaging results that were available during my care of the patient were reviewed by me and considered in my medical decision making (see chart for details).   Patient presents for concerns of blood noted after a bowel movement.  Reassuring examination at this time with also see abdominal pain.  She does have complaint of nausea and feeling of dry heaves, she is not had her medications at today and her blood pressure is elevated.  Additionally, she has no complaints of neurologic cardiac or pulmonary symptoms.  She relays a history of chronic nausea and dry heaves treated with Zofran without clear etiology.  Her hemoglobin is stable today.  She has a mild leukocytosis but without associated abdominal pain, I do not have a clear explanation, but a very  reassuring exam.  Denies any urinary symptoms and her complaint is that of small amount of rectal bleeding.  Suspect this may be due to hemorrhoidal type bleeding based on the history of noting it on tissue paper as well as her actual heme-negative test here in the stool.  Clinical Course as of 02/28/21 1543  Sat Feb 28, 2021  0927 RN notified me, delayed due to difficult vascular access. [MQ]  1509 Patient was ready for discharge but then began having dry heaves.  She not actively vomiting.  She also reports that she normally has to take Zofran about twice a day for same symptoms just had not had it here today and believes that that plus not having eaten anything is causing her to have dry heaves.  Will trial Zofran here and reassess, patient does feel that she would strongly like to build to go home soon [MQ]    Clinical Course User Index [MQ] Delman Kitten, MD    Discussed with the patient, she and both her son is at the bedside are comfortable plan to follow-up closely with her primary doctor and monitor for signs or symptoms of recurrent or concerning bleeding, if this were to occur she would return to the ER right away.  ----------------------------------------- 3:31 PM on 02/28/2021 -----------------------------------------  Blood pressure now 183/76.  Patient has been able to drink water or eat crackers and her dry heaves have gone away.  She feels much better.  She is comfortable with the plan to follow-up with her doctor.  I will give her her home hydralazine which is listed as 50 mg from her last doctor's note as she has not had her afternoon medication.  Patient comfortable with plan, will discharged home with her son with follow-up plan recommended to see her primary as well as GI  Return precautions and treatment recommendations and follow-up discussed with the patient who is agreeable with the plan.   ____________________________________________   FINAL CLINICAL IMPRESSION(S) /  ED DIAGNOSES  Final diagnoses:  Rectal bleeding  External hemorrhoid  Hypertension, unspecified type        Note:  This document was prepared using Dragon voice recognition software and may include unintentional dictation errors       Delman Kitten, MD 02/28/21 1544

## 2021-02-28 NOTE — ED Triage Notes (Signed)
First Nurse Note:  Arrives with c/o rectal bleeding this morning.  Also c/o nausea and vomiting.    Patient is awake and alert, anxious.  NAD

## 2021-04-01 ENCOUNTER — Emergency Department: Payer: Medicare PPO

## 2021-04-01 ENCOUNTER — Encounter: Payer: Self-pay | Admitting: Emergency Medicine

## 2021-04-01 ENCOUNTER — Emergency Department
Admission: EM | Admit: 2021-04-01 | Discharge: 2021-04-01 | Disposition: A | Discharge status: Home / Self Care | Payer: Medicare PPO | Attending: Emergency Medicine | Admitting: Emergency Medicine

## 2021-04-01 ENCOUNTER — Other Ambulatory Visit: Payer: Self-pay

## 2021-04-01 DIAGNOSIS — F419 Anxiety disorder, unspecified: Secondary | ICD-10-CM

## 2021-04-01 DIAGNOSIS — K2900 Acute gastritis without bleeding: Secondary | ICD-10-CM

## 2021-04-01 DIAGNOSIS — R112 Nausea with vomiting, unspecified: Secondary | ICD-10-CM | POA: Diagnosis present

## 2021-04-01 DIAGNOSIS — Z79899 Other long term (current) drug therapy: Secondary | ICD-10-CM | POA: Diagnosis not present

## 2021-04-01 DIAGNOSIS — I10 Essential (primary) hypertension: Secondary | ICD-10-CM | POA: Insufficient documentation

## 2021-04-01 DIAGNOSIS — R197 Diarrhea, unspecified: Secondary | ICD-10-CM | POA: Insufficient documentation

## 2021-04-01 DIAGNOSIS — K2970 Gastritis, unspecified, without bleeding: Secondary | ICD-10-CM | POA: Diagnosis not present

## 2021-04-01 DIAGNOSIS — R42 Dizziness and giddiness: Secondary | ICD-10-CM | POA: Diagnosis not present

## 2021-04-01 LAB — CBC WITH DIFFERENTIAL/PLATELET
Abs Immature Granulocytes: 0.04 10*3/uL (ref 0.00–0.07)
Basophils Absolute: 0 10*3/uL (ref 0.0–0.1)
Basophils Relative: 0 %
Eosinophils Absolute: 0.2 10*3/uL (ref 0.0–0.5)
Eosinophils Relative: 1 %
HCT: 36.9 % (ref 36.0–46.0)
Hemoglobin: 12.2 g/dL (ref 12.0–15.0)
Immature Granulocytes: 0 %
Lymphocytes Relative: 10 %
Lymphs Abs: 1.4 10*3/uL (ref 0.7–4.0)
MCH: 29.4 pg (ref 26.0–34.0)
MCHC: 33.1 g/dL (ref 30.0–36.0)
MCV: 88.9 fL (ref 80.0–100.0)
Monocytes Absolute: 1.1 10*3/uL — ABNORMAL HIGH (ref 0.1–1.0)
Monocytes Relative: 8 %
Neutro Abs: 10.4 10*3/uL — ABNORMAL HIGH (ref 1.7–7.7)
Neutrophils Relative %: 81 %
Platelets: 218 10*3/uL (ref 150–400)
RBC: 4.15 MIL/uL (ref 3.87–5.11)
RDW: 13.6 % (ref 11.5–15.5)
WBC: 13.1 10*3/uL — ABNORMAL HIGH (ref 4.0–10.5)
nRBC: 0 % (ref 0.0–0.2)

## 2021-04-01 LAB — URINALYSIS, COMPLETE (UACMP) WITH MICROSCOPIC
Bacteria, UA: NONE SEEN
Bilirubin Urine: NEGATIVE
Glucose, UA: NEGATIVE mg/dL
Hgb urine dipstick: NEGATIVE
Ketones, ur: NEGATIVE mg/dL
Leukocytes,Ua: NEGATIVE
Nitrite: NEGATIVE
Protein, ur: NEGATIVE mg/dL
Specific Gravity, Urine: 1.004 — ABNORMAL LOW (ref 1.005–1.030)
pH: 9 — ABNORMAL HIGH (ref 5.0–8.0)

## 2021-04-01 LAB — COMPREHENSIVE METABOLIC PANEL
ALT: 16 U/L (ref 0–44)
AST: 26 U/L (ref 15–41)
Albumin: 4.1 g/dL (ref 3.5–5.0)
Alkaline Phosphatase: 52 U/L (ref 38–126)
Anion gap: 11 (ref 5–15)
BUN: 11 mg/dL (ref 8–23)
CO2: 24 mmol/L (ref 22–32)
Calcium: 9.2 mg/dL (ref 8.9–10.3)
Chloride: 100 mmol/L (ref 98–111)
Creatinine, Ser: 1 mg/dL (ref 0.44–1.00)
GFR, Estimated: 58 mL/min — ABNORMAL LOW (ref >60)
Glucose, Bld: 92 mg/dL (ref 70–99)
Potassium: 3.6 mmol/L (ref 3.5–5.1)
Sodium: 135 mmol/L (ref 135–145)
Total Bilirubin: 0.6 mg/dL (ref 0.3–1.2)
Total Protein: 7.5 g/dL (ref 6.5–8.1)

## 2021-04-01 LAB — TROPONIN I (HIGH SENSITIVITY)
Troponin I (High Sensitivity): 5 ng/L (ref <18)
Troponin I (High Sensitivity): 6 ng/L (ref <18)

## 2021-04-01 LAB — LIPASE, BLOOD: Lipase: 24 U/L (ref 11–51)

## 2021-04-01 MED ORDER — ALUM & MAG HYDROXIDE-SIMETH 200-200-20 MG/5ML PO SUSP
30.0000 mL | Freq: Once | ORAL | Status: AC
  Administered 2021-04-01: 30 mL via ORAL
  Filled 2021-04-01: qty 30

## 2021-04-01 MED ORDER — LORAZEPAM 0.5 MG PO TABS: 0.5000 mg | ORAL_TABLET | Freq: Every day | ORAL | 0 refills | Status: AC | PRN

## 2021-04-01 MED ORDER — IOHEXOL 300 MG/ML  SOLN
100.0000 mL | Freq: Once | INTRAMUSCULAR | Status: AC | PRN
  Administered 2021-04-01: 100 mL via INTRAVENOUS

## 2021-04-01 MED ORDER — SODIUM CHLORIDE 0.9 % IV BOLUS
1000.0000 mL | Freq: Once | INTRAVENOUS | Status: AC
  Administered 2021-04-01: 1000 mL via INTRAVENOUS

## 2021-04-01 MED ORDER — LIDOCAINE VISCOUS HCL 2 % MT SOLN
15.0000 mL | Freq: Once | OROMUCOSAL | Status: AC
  Administered 2021-04-01: 15 mL via ORAL
  Filled 2021-04-01: qty 15

## 2021-04-01 MED ORDER — ONDANSETRON HCL 4 MG/2ML IJ SOLN
4.0000 mg | Freq: Once | INTRAMUSCULAR | Status: AC
  Administered 2021-04-01: 4 mg via INTRAVENOUS
  Filled 2021-04-01: qty 2

## 2021-04-01 MED ORDER — LORAZEPAM 2 MG/ML IJ SOLN
0.5000 mg | Freq: Once | INTRAMUSCULAR | Status: AC
  Administered 2021-04-01: 0.5 mg via INTRAVENOUS
  Filled 2021-04-01: qty 1

## 2021-04-01 NOTE — ED Provider Notes (Signed)
[facility] Emergency Department Provider Note  Time seen: 8:12 AM  I have reviewed the triage vital signs and the nursing notes.   HISTORY  Chief Complaint Nausea, Emesis, and Dizziness  HPI Cindy Huerta is a 75 y.o. female with a past medical history of hypertension who presents to the emergency department for nausea vomiting diarrhea and upper abdominal pain.  According to the patient since early this morning she has been experiencing upper abdominal pain with nausea vomiting and diarrhea.  Patient states she tried to take a nausea tablet under her tongue (Zofran ODT) which she is prescribed at home for nausea but she vomited shortly afterwards.  Denies any fever cough or shortness of breath.  Patient states moderate aching/burning upper abdominal pain mostly in the left upper quadrant.  Patient states she has had this before but does not know why.   Past Medical History:  Diagnosis Date  . Hypertension     There are no problems to display for this patient.   History reviewed. No pertinent surgical history.  Prior to Admission medications   Medication Sig Start Date End Date Taking? Authorizing Provider  acetaminophen (TYLENOL) 500 MG tablet Take 500-1,000 mg by mouth as needed for pain.    [provider]  aspirin 81 MG EC tablet Take 81 mg by mouth daily.    [provider]  atorvastatin (LIPITOR) 80 MG tablet Take 80 mg by mouth daily. 01/15/20   [provider]  Cholecalciferol 25 MCG (1000 UT) capsule Take 1,000 Units by mouth daily.    [provider]  clopidogrel (PLAVIX) 75 MG tablet Take 75 mg by mouth daily. 11/23/19   [provider]  Cyanocobalamin (VITAMIN B-12 PO) Take 1 tablet by mouth daily.    [provider]  fexofenadine (ALLEGRA) 180 MG tablet Take 180 mg by mouth as needed for allergies.    [provider]  hydrALAZINE (APRESOLINE) 25 MG tablet Take 25 mg by mouth 3 (three)  times daily. 02/04/21   [provider]  levothyroxine (SYNTHROID) 75 MCG tablet Take 75 mcg by mouth every morning. 02/10/21   [provider]  LORazepam (ATIVAN) 0.5 MG tablet Take 0.25-0.5 mg by mouth every 12 (twelve) hours as needed for anxiety. 02/25/21   [provider]  magnesium oxide (MAG-OX) 400 MG tablet Take 400 mg by mouth 2 (two) times daily.    [provider]  meclizine (ANTIVERT) 12.5 MG tablet Take 12.5 mg by mouth as needed for dizziness.    [provider]  Multiple Vitamins-Minerals (THERA-M) TABS Take 1 tablet by mouth daily.    [provider]  ondansetron (ZOFRAN-ODT) 4 MG disintegrating tablet Take 4 mg by mouth as needed for nausea/vomiting. 12/16/20   [provider]  pantoprazole (PROTONIX) 40 MG tablet Take 40 mg by mouth daily.    [provider]  potassium chloride SA (KLOR-CON) 20 MEQ tablet Take 20 mEq by mouth daily.    [provider]  sertraline (ZOLOFT) 25 MG tablet Take 25 mg by mouth daily. 02/16/21   [provider]  telmisartan (MICARDIS) 80 MG tablet Take 80 mg by mouth daily. 10/06/18   [provider]    Allergies  Allergen Reactions  . Latex Rash  . Lisinopril Cough  . Procaine Other (See Comments)    Unsure - told by DDS not to let anyone give it to her  Confusion   . Diphenhydramine Hcl Other (See Comments)  States she was told to not take - jitteriness and agitation Not sure of reaction   . Clarithromycin Other (See Comments)    Confusion    . Oxycodone Nausea And Vomiting and Anxiety    History reviewed. No pertinent family history.  Social History Social History   Tobacco Use  . Smoking status: Never Smoker  . Smokeless tobacco: Never Used    Review of Systems Constitutional: Negative for fever. Cardiovascular: Negative for chest pain. Respiratory: Negative for shortness of breath. Gastrointestinal: Upper abdominal pain/burning.   Positive for nausea vomiting diarrhea. Genitourinary: Negative for urinary compaints Musculoskeletal: Negative for musculoskeletal complaints Neurological: Negative for headache All other ROS negative  ____________________________________________   PHYSICAL EXAM:  VITAL SIGNS: ED Triage Vitals  Enc Vitals Group     BP 04/01/21 0755 (!) 199/76     Pulse Rate 04/01/21 0755 65     Resp 04/01/21 0755 (!) 22     Temp 04/01/21 0755 98 F (36.7 C)     Temp Source 04/01/21 0755 Oral     SpO2 04/01/21 0755 100 %     Weight 04/01/21 0749 179 lb 14.3 oz (81.6 kg)     Height 04/01/21 0749 5\' 4"  (1.626 m)     Head Circumference --      Peak Flow --      Pain Score 04/01/21 0749 9     Pain Loc --      Pain Edu? --      Excl. in [place]? --    Constitutional: Alert and oriented.  Patient is nauseated but occasionally reaching for her emesis bag but has not vomited in the room. Eyes: Normal exam ENT      Head: Normocephalic and atraumatic.      Mouth/Throat: Mucous membranes are moist. Cardiovascular: Normal rate, regular rhythm.  Respiratory: Normal respiratory effort without tachypnea nor retractions. Breath sounds are clear Gastrointestinal: Soft, moderate upper abdominal tenderness to palpation without rebound guarding or distention. Musculoskeletal: Nontender with normal range of motion in all extremities.  Neurologic:  Normal speech and language. No gross focal neurologic deficits Skin:  Skin is warm, dry and intact.  Psychiatric: Mood and affect are normal.  ____________________________________________    EKG  EKG viewed and interpreted by myself shows a normal sinus rhythm at 61 bpm with a narrow QRS, normal axis, normal intervals, no concerning ST changes.  ____________________________________________    RADIOLOGY  CT scan shows localized wall thickening of the gastric antrum likely gastritis.  Work-up otherwise largely  nonrevealing.  ____________________________________________   INITIAL IMPRESSION / ASSESSMENT AND PLAN / ED COURSE  Pertinent labs & imaging results that were available during my care of the patient were reviewed by me and considered in my medical decision making (see chart for details).   Patient presents to the emergency department for upper abdominal discomfort nausea vomiting diarrhea starting early this morning.  Patient does have moderate upper abdominal tenderness mostly left upper quadrant.  Denies any known sick contacts denies any fever or cough.  We will treat nausea, IV hydrate.  We will obtain labs, urine we will continue to closely monitor.  Patient will likely require CT imaging of the abdomen given her age and tenderness on exam.  Differential is quite broad but would include gastroenteritis, pancreatitis, biliary disease, SBO.  Patient's work-up is overall reassuring.  CT most consistent with gastritis.  Patient given a GI cocktail and states the burning in her stomach is now gone however the nausea  has returned to the patient is feeling nauseated once again.  Patient is very nervous/anxious throughout our examination.  We will dose 0.5 mg IV Ativan and reassess.  Patient is feeling much better after the medication.  Highly suspect anxiety is playing a major role with this patient as well.  We will discharge her with a small course of anxiety medication and have her follow-up with her PCP.  As far as the gastritis patient is already on 40 mg Protonix but we will also recommend over-the-counter Maalox to be used for acute exacerbations.  Patient agreeable to plan of care.  Patient has Zofran at home to be used for nausea.  Cindy Huerta was evaluated in Emergency Department on 04/01/2021 for the symptoms described in the history of present illness. She was evaluated in the context of the global COVID-19 pandemic, which necessitated consideration that the patient might be at risk for  infection with the SARS-CoV-2 virus that causes COVID-19. Institutional protocols and algorithms that pertain to the evaluation of patients at risk for COVID-19 are in a state of rapid change based on information released by regulatory bodies including the CDC and federal and state organizations. These policies and algorithms were followed during the patient's care in the ED.  ____________________________________________   FINAL CLINICAL IMPRESSION(S) / ED DIAGNOSES  Anxiety Cindy Huerta   Harvest Dark, MD 04/01/21 1505

## 2021-04-01 NOTE — ED Triage Notes (Signed)
Pt to ED via POV with c/o N/V/D and dizziness, pt also c/o upper abd pain that started yesterday. Pt states vomiting started 3 days ago. Pt states her doctor gave her something to put under her tongue that gave some relief for nausea.

## 2021-04-01 NOTE — ED Notes (Addendum)
Pharmacy at bedside advising patient endorses nauseous, this RN entered room to find pt belching and complaining of nausea. No vomiting noted. MD Paduchowski made aware.

## 2021-04-01 NOTE — Discharge Instructions (Addendum)
As we discussed please use over-the-counter liquid Maalox needed for upper abdominal discomfort.  Please call the number provided for GI medicine to arrange a follow-up appointment.  May use your prescribed anxiety medication but only as written.  Please follow-up with your doctor regarding this medication.

## 2021-04-01 NOTE — ED Notes (Signed)
Pt ambulatory to commode in room with a steady gait using a walker.

## 2021-04-02 ENCOUNTER — Emergency Department (HOSPITAL_COMMUNITY)
Admission: EM | Admit: 2021-04-02 | Discharge: 2021-04-03 | Disposition: A | Discharge status: Home / Self Care | Payer: Medicare PPO | Attending: Emergency Medicine | Admitting: Emergency Medicine

## 2021-04-02 ENCOUNTER — Other Ambulatory Visit: Payer: Self-pay

## 2021-04-02 DIAGNOSIS — E039 Hypothyroidism, unspecified: Secondary | ICD-10-CM | POA: Diagnosis not present

## 2021-04-02 DIAGNOSIS — Z91040 Latex allergy status: Secondary | ICD-10-CM | POA: Insufficient documentation

## 2021-04-02 DIAGNOSIS — E876 Hypokalemia: Secondary | ICD-10-CM | POA: Diagnosis not present

## 2021-04-02 DIAGNOSIS — R112 Nausea with vomiting, unspecified: Secondary | ICD-10-CM

## 2021-04-02 DIAGNOSIS — I10 Essential (primary) hypertension: Secondary | ICD-10-CM | POA: Diagnosis not present

## 2021-04-02 DIAGNOSIS — K2970 Gastritis, unspecified, without bleeding: Secondary | ICD-10-CM | POA: Insufficient documentation

## 2021-04-02 DIAGNOSIS — Z7902 Long term (current) use of antithrombotics/antiplatelets: Secondary | ICD-10-CM | POA: Insufficient documentation

## 2021-04-02 DIAGNOSIS — Z79899 Other long term (current) drug therapy: Secondary | ICD-10-CM | POA: Diagnosis not present

## 2021-04-02 DIAGNOSIS — Z7982 Long term (current) use of aspirin: Secondary | ICD-10-CM | POA: Insufficient documentation

## 2021-04-02 LAB — URINALYSIS, ROUTINE W REFLEX MICROSCOPIC
Bilirubin Urine: NEGATIVE
Glucose, UA: NEGATIVE mg/dL
Hgb urine dipstick: NEGATIVE
Ketones, ur: NEGATIVE mg/dL
Leukocytes,Ua: NEGATIVE
Nitrite: NEGATIVE
Protein, ur: NEGATIVE mg/dL
Specific Gravity, Urine: 1.004 — ABNORMAL LOW (ref 1.005–1.030)
pH: 9 — ABNORMAL HIGH (ref 5.0–8.0)

## 2021-04-02 LAB — COMPREHENSIVE METABOLIC PANEL
ALT: 16 U/L (ref 0–44)
AST: 24 U/L (ref 15–41)
Albumin: 4.1 g/dL (ref 3.5–5.0)
Alkaline Phosphatase: 57 U/L (ref 38–126)
Anion gap: 11 (ref 5–15)
BUN: 10 mg/dL (ref 8–23)
CO2: 22 mmol/L (ref 22–32)
Calcium: 9.5 mg/dL (ref 8.9–10.3)
Chloride: 101 mmol/L (ref 98–111)
Creatinine, Ser: 1.04 mg/dL — ABNORMAL HIGH (ref 0.44–1.00)
GFR, Estimated: 55 mL/min — ABNORMAL LOW (ref >60)
Glucose, Bld: 92 mg/dL (ref 70–99)
Potassium: 3.2 mmol/L — ABNORMAL LOW (ref 3.5–5.1)
Sodium: 134 mmol/L — ABNORMAL LOW (ref 135–145)
Total Bilirubin: 0.4 mg/dL (ref 0.3–1.2)
Total Protein: 7.8 g/dL (ref 6.5–8.1)

## 2021-04-02 LAB — CBC WITH DIFFERENTIAL/PLATELET
Abs Immature Granulocytes: 0.05 10*3/uL (ref 0.00–0.07)
Basophils Absolute: 0 10*3/uL (ref 0.0–0.1)
Basophils Relative: 0 %
Eosinophils Absolute: 0.1 10*3/uL (ref 0.0–0.5)
Eosinophils Relative: 1 %
HCT: 36.9 % (ref 36.0–46.0)
Hemoglobin: 12.3 g/dL (ref 12.0–15.0)
Immature Granulocytes: 1 %
Lymphocytes Relative: 19 %
Lymphs Abs: 2.1 10*3/uL (ref 0.7–4.0)
MCH: 29.5 pg (ref 26.0–34.0)
MCHC: 33.3 g/dL (ref 30.0–36.0)
MCV: 88.5 fL (ref 80.0–100.0)
Monocytes Absolute: 1.2 10*3/uL — ABNORMAL HIGH (ref 0.1–1.0)
Monocytes Relative: 11 %
Neutro Abs: 7.2 10*3/uL (ref 1.7–7.7)
Neutrophils Relative %: 68 %
Platelets: 242 10*3/uL (ref 150–400)
RBC: 4.17 MIL/uL (ref 3.87–5.11)
RDW: 13.9 % (ref 11.5–15.5)
WBC: 10.7 10*3/uL — ABNORMAL HIGH (ref 4.0–10.5)
nRBC: 0 % (ref 0.0–0.2)

## 2021-04-02 LAB — LIPASE, BLOOD: Lipase: 23 U/L (ref 11–51)

## 2021-04-02 MED ORDER — ONDANSETRON 4 MG PO TBDP: 4.0000 mg | ORAL_TABLET | Freq: Once | ORAL | Status: DC

## 2021-04-02 MED ORDER — METOCLOPRAMIDE HCL 5 MG/ML IJ SOLN
10.0000 mg | Freq: Once | INTRAMUSCULAR | Status: DC
  Filled 2021-04-02: qty 2

## 2021-04-02 MED ORDER — ALUM & MAG HYDROXIDE-SIMETH 200-200-20 MG/5ML PO SUSP
30.0000 mL | Freq: Once | ORAL | Status: AC
  Administered 2021-04-02: 30 mL via ORAL
  Filled 2021-04-02: qty 30

## 2021-04-02 MED ORDER — METOCLOPRAMIDE HCL 5 MG/ML IJ SOLN
5.0000 mg | Freq: Once | INTRAMUSCULAR | Status: AC
  Administered 2021-04-02: 5 mg via INTRAVENOUS

## 2021-04-02 MED ORDER — LIDOCAINE VISCOUS HCL 2 % MT SOLN
15.0000 mL | Freq: Once | OROMUCOSAL | Status: AC
  Administered 2021-04-02: 15 mL via ORAL
  Filled 2021-04-02: qty 15

## 2021-04-02 MED ORDER — POTASSIUM CHLORIDE 10 MEQ/100ML IV SOLN
10.0000 meq | INTRAVENOUS | Status: DC
  Administered 2021-04-02: 10 meq via INTRAVENOUS
  Filled 2021-04-02: qty 100

## 2021-04-02 MED ORDER — SODIUM CHLORIDE 0.9 % IV BOLUS
1000.0000 mL | Freq: Once | INTRAVENOUS | Status: AC
  Administered 2021-04-02: 1000 mL via INTRAVENOUS

## 2021-04-02 MED ORDER — METOCLOPRAMIDE HCL 5 MG/ML IJ SOLN: 5.0000 mg | Freq: Once | INTRAMUSCULAR | Status: DC

## 2021-04-02 MED ORDER — PANTOPRAZOLE SODIUM 40 MG IV SOLR
40.0000 mg | Freq: Once | INTRAVENOUS | Status: AC
  Administered 2021-04-02: 40 mg via INTRAVENOUS
  Filled 2021-04-02: qty 40

## 2021-04-02 MED ORDER — ONDANSETRON HCL 4 MG/2ML IJ SOLN: 4.0000 mg | Freq: Once | INTRAMUSCULAR | Status: DC

## 2021-04-02 MED ORDER — POTASSIUM CHLORIDE 10 MEQ/100ML IV SOLN
10.0000 meq | INTRAVENOUS | Status: AC
  Administered 2021-04-02: 10 meq via INTRAVENOUS
  Filled 2021-04-02: qty 100

## 2021-04-02 NOTE — ED Provider Notes (Addendum)
Emergency Medicine Provider Triage Evaluation Note  Cindy Huerta, a 75 y.o. female evaluated in triage.  Pt complains of nausea and vomiting.  Was seen at [facility] ED yesterday for nausea, vomiting, diarrhea upper abdominal pain.  Negative CT.  Work-up was overall reassuring, felt to be gastritis with anxiety component, per documentation  BP (!) 190/101 (BP Location: Right Arm)   Pulse 72   Temp 98.9 F (37.2 C) (Oral)   Resp (!) 24   SpO2 100%   Patient is alert, normal work of breathing, patient appears uncomfortable, belching    Medically screening exam initiated at 6:17 PM. Appropriate orders placed.  [name] was informed that the remainder of the evaluation will be completed by another provider, this initial triage assessment does not replace that evaluation, and the importance of remaining in the ED until their evaluation is complete.       [name], [place] N, PA-C 04/02/21 1818    Verley Pariseau, [place] N, PA-C 04/02/21 1821    Blanchie Dessert, MD 04/06/21 [phone]

## 2021-04-02 NOTE — Discharge Instructions (Addendum)
As we discussed, your blood work was normal except for a low potassium level.  We gave you potassium through your IV.  We also gave you medication to help with your vomiting and you were able to tolerate water.  You can continue to take maalox at home and also use zorfan as needed for nausea and vomiting.  You can also use the anxiety medication that was sent home with you, which is also helpful for nausea and vomiting.  Call your doctor tomorrow to schedule an appointment tomorrow or Monday for follow up.  You should have blood work done to make sure that your potassium improves.  Come back if you have significant worsening in your symptoms, you can't tolerate anything by mouth, or you develop fever.

## 2021-04-02 NOTE — ED Provider Notes (Signed)
[facility] EMERGENCY DEPARTMENT Provider Note   CSN: [id number] Arrival date & time: 04/02/21  1740     History Chief Complaint  Patient presents with  . Nausea  . Vomiting    Cindy Huerta is a 75 y.o. female.  Nausea and vomiting Started 4 days ago Was seen at [facility] regional yesterday at Patient reports that she has been unable to tolerate Zofran at home, she immediately vomits afterwards States that she is able to drink a little bit of water, but then always ends up vomiting afterwards She has been unable to take her blood pressure medication and her thyroid medication because she continues to vomit States that she had diarrhea 4 days ago, but has not had any diarrhea since then States that she had a small amount of blood on the toilet paper when she wiped 4 days ago, but none since then Denies any chest pain, chest pain, fevers, chills States that she does have a burning that comes from her mouth down into her upper abdomen Reports pain in the epigastric region that also feels like a burning        Past Medical History:  Diagnosis Date  . Hypertension     There are no problems to display for this patient.   No past surgical history on file.   OB History   No obstetric history on file.     No family history on file.  Social History   Tobacco Use  . Smoking status: Never Smoker  . Smokeless tobacco: Never Used    Home Medications Prior to Admission medications   Medication Sig Start Date End Date Taking? Authorizing Provider  acetaminophen (TYLENOL) 500 MG tablet Take 500-1,000 mg by mouth as needed for pain.    [provider]  aspirin 81 MG EC tablet Take 81 mg by mouth daily.    [provider]  atorvastatin (LIPITOR) 80 MG tablet Take 80 mg by mouth daily. 01/15/20   [provider]  Cholecalciferol 25 MCG (1000 UT) capsule Take 1,000 Units by mouth daily.    [provider]  clopidogrel  (PLAVIX) 75 MG tablet Take 75 mg by mouth daily. 11/23/19   [provider]  Cyanocobalamin (VITAMIN B-12 PO) Take 1 tablet by mouth daily.    [provider]  fexofenadine (ALLEGRA) 180 MG tablet Take 180 mg by mouth as needed for allergies.    [provider]  hydrALAZINE (APRESOLINE) 25 MG tablet Take 25 mg by mouth 3 (three) times daily. 02/04/21   [provider]  levothyroxine (SYNTHROID) 75 MCG tablet Take 75 mcg by mouth every morning. 02/10/21   [provider]  LORazepam (ATIVAN) 0.5 MG tablet Take 1 tablet (0.5 mg total) by mouth daily as needed for up to 20 days for anxiety. 04/01/21 04/21/21  Minna Antis, MD  magnesium oxide (MAG-OX) 400 MG tablet Take 400 mg by mouth daily.    [provider]  meclizine (ANTIVERT) 12.5 MG tablet Take 12.5 mg by mouth as needed for dizziness.    [provider]  metoprolol succinate (TOPROL-XL) 50 MG 24 hr tablet Take 50 mg by mouth 2 (two) times daily. 03/18/21   [provider]  Multiple Vitamins-Minerals (THERA-M) TABS Take 1 tablet by mouth daily.    [provider]  ondansetron (ZOFRAN-ODT) 4 MG disintegrating tablet Take 4 mg by mouth as needed for nausea/vomiting. 12/16/20   [provider]  pantoprazole (PROTONIX) 40 MG tablet  Take 40 mg by mouth daily.    [provider]  potassium chloride SA (KLOR-CON) 20 MEQ tablet Take 20 mEq by mouth daily.    [provider]  sertraline (ZOLOFT) 25 MG tablet Take 25 mg by mouth daily. 02/16/21   [provider]  telmisartan (MICARDIS) 80 MG tablet Take 80 mg by mouth daily. 10/06/18   [provider]    Allergies    Latex, Lisinopril, Procaine, Diphenhydramine hcl, Clarithromycin, and Oxycodone  Review of Systems   Review of Systems  Constitutional: Positive for appetite change. Negative for chills and fever.  HENT: Negative for congestion and sore throat.   Eyes: Negative for  visual disturbance.  Respiratory: Negative for cough and shortness of breath.   Cardiovascular: Negative for chest pain and leg swelling.  Gastrointestinal: Positive for abdominal pain, blood in stool, diarrhea, nausea and vomiting. Negative for constipation.  Genitourinary: Positive for decreased urine volume (states that she has urinated a little less today, can't quantify). Negative for difficulty urinating.  Musculoskeletal: Negative for back pain.  Skin: Negative for rash.  Neurological: Positive for weakness (chronic RLE weakness). Negative for syncope and headaches.    Physical Exam Updated Vital Signs BP (!) 181/73   Pulse 62   Temp 98.9 F (37.2 C) (Oral)   Resp (!) 28   SpO2 99%   Physical Exam Vitals reviewed.  Constitutional:      Comments: Slightly anxious appearing, belching throughout exam  HENT:     Head: Normocephalic and atraumatic.     Mouth/Throat:     Comments: Dry lips, tacky mucus membranes Eyes:     Extraocular Movements: Extraocular movements intact.     Conjunctiva/sclera: Conjunctivae normal.     Pupils: Pupils are equal, round, and reactive to light.  Cardiovascular:     Rate and Rhythm: Normal rate and regular rhythm.     Heart sounds: No murmur heard. No friction rub. No gallop.   Pulmonary:     Effort: Pulmonary effort is normal. No respiratory distress.     Breath sounds: No wheezing, rhonchi or rales.  Abdominal:     General: There is no distension.     Palpations: Abdomen is soft. There is no mass.     Tenderness: There is abdominal tenderness (epigastric). There is guarding (voluntary). There is no rebound.  Musculoskeletal:     Cervical back: Normal range of motion.     Right lower leg: No edema.     Left lower leg: No edema.     Comments: 5/5 strength BUE, 4/5 strength RLE which is chronic per patient, 5/5 strength LLE  Skin:    General: Skin is warm and dry.  Neurological:     General: No focal deficit present.     Mental  Status: She is alert and oriented to person, place, and time.     Cranial Nerves: No cranial nerve deficit.     Sensory: No sensory deficit.     Comments: Sensation intact throughout     ED Results / Procedures / Treatments   Labs (all labs ordered are listed, but only abnormal results are displayed) Labs Reviewed  COMPREHENSIVE METABOLIC PANEL - Abnormal; Notable for the following components:      Result Value   Sodium 134 (*)    Potassium 3.2 (*)    Creatinine, Ser 1.04 (*)    GFR, Estimated 55 (*)    All other components within normal limits  CBC WITH DIFFERENTIAL/PLATELET - Abnormal;  Notable for the following components:   WBC 10.7 (*)    Monocytes Absolute 1.2 (*)    All other components within normal limits  URINALYSIS, ROUTINE W REFLEX MICROSCOPIC - Abnormal; Notable for the following components:   Color, Urine STRAW (*)    Specific Gravity, Urine 1.004 (*)    pH 9.0 (*)    All other components within normal limits  LIPASE, BLOOD    EKG EKG Interpretation  Date/Time:  Thursday April 02 2021 17:58:30 EDT Ventricular Rate:  69 PR Interval:  162 QRS Duration: 86 QT Interval:  410 QTC Calculation: 439 R Axis:   6 Text Interpretation: Normal sinus rhythm Possible Anterior infarct , age undetermined No significant change since last tracing Confirmed by Blanchie Dessert [phone]) on 04/02/2021 7:37:15 PM   Radiology CT ABDOMEN PELVIS W CONTRAST  Result Date: 04/01/2021 CLINICAL DATA:  Abdominal pain with nausea and vomiting EXAM: CT ABDOMEN AND PELVIS WITH CONTRAST TECHNIQUE: Multidetector CT imaging of the abdomen and pelvis was performed using the standard protocol following bolus administration of intravenous contrast. CONTRAST:  186mL OMNIPAQUE IOHEXOL 300 MG/ML  SOLN COMPARISON:  None. FINDINGS: Lower chest: Lung bases are clear. Hepatobiliary: There is hepatic steatosis. There is a 5 mm probable cyst in the left lobe of the liver. No other focal liver lesions  appreciable. Gallbladder is absent. There is no appreciable biliary duct dilatation. Pancreas: There is no appreciable pancreatic mass or inflammatory focus. Spleen: No splenic lesions are evident. Adrenals/Urinary Tract: Adrenals bilaterally appear unremarkable. No evident renal mass or hydronephrosis on either side. There is no appreciable renal or ureteral calculus on either side. Urinary bladder is midline with wall thickness within normal limits. Stomach/Bowel: There are multiple descending colonic and sigmoid diverticula without diverticulitis evident. Scattered diverticula elsewhere without inflammatory change noted. There is fairly diffuse stool throughout the colon. There is localized wall thickening in a portion of the gastric antrum. No ulceration seen by CT in this area. No other areas of bowel wall thickening evident. There is no evident bowel obstruction. No appendiceal region inflammation evident. No free air or portal venous air. Vascular/Lymphatic: No abdominal aortic aneurysm. There is aortic and iliac artery atherosclerotic calcification. Major venous structures appear patent. There is no appreciable adenopathy in the abdomen or pelvis. Reproductive: Apparent uterine absence. No adnexal masses appreciable. Other: No abscess or ascites evident in the abdomen or pelvis. Musculoskeletal: There are foci of degenerative change in the lower thoracic and lumbar regions. No blastic or lytic bone lesions. No intramuscular or abdominal wall lesions. IMPRESSION: 1. Localized wall thickening in the gastric antrum, likely indicative of a degree of antral gastritis. No other bowel wall thickening seen on this study. 2. Colonic diverticulosis without diverticulitis. Fairly diffuse stool throughout colon. No bowel obstruction. No abscess in the abdomen or pelvis. 3. No renal or ureteral calculus. No hydronephrosis. Urinary bladder wall thickness normal. 4.  Hepatic steatosis. 5.  Gallbladder and uterus absent. 6.   Aortic Atherosclerosis (ICD10-I70.0). Electronically Signed   By: Lowella Grip III M.D.   On: 04/01/2021 10:03    Procedures Procedures   Medications Ordered in ED Medications  potassium chloride 10 mEq in 100 mL IVPB (10 mEq Intravenous New Bag/Given 04/02/21 2323)  sodium chloride 0.9 % bolus 1,000 mL (0 mLs Intravenous Stopped 04/02/21 2315)  alum & mag hydroxide-simeth (MAALOX/MYLANTA) 200-200-20 MG/5ML suspension 30 mL (30 mLs Oral Given 04/02/21 2148)    And  lidocaine (XYLOCAINE) 2 % viscous mouth solution 15 mL (  15 mLs Oral Given 04/02/21 2148)  pantoprazole (PROTONIX) injection 40 mg (40 mg Intravenous Given 04/02/21 2139)  metoCLOPramide (REGLAN) injection 5 mg (5 mg Intravenous Given 04/02/21 2136)    ED Course  I have reviewed the triage vital signs and the nursing notes.  Pertinent labs & imaging results that were available during my care of the patient were reviewed by me and considered in my medical decision making (see chart for details).    MDM Rules/Calculators/A&P                          Patient is a 75 yo female with past medical history of hypertension, prior CVA, hypothyroidism, who presents with 4 days of nausea and vomiting, inability to tolerate p.o.  She was seen at [facility] yesterday and had a work-up that was rather nonrevealing, CT abdomen and pelvis at that time was consistent with gastritis.  She has been unable to tolerate oral medications at home, specifically Maalox and Zofran.  It was thought at [facility] regional that anxiety was contributing to patient's overall picture as well as she improved with IV Ativan.  She was discharged home with lorazepam tablets, has not been able to tolerate these.  She has not been able to take her blood pressure medications, she is hypertensive on exam today.  She denies headache or changes in her vision, no signs of end organ damage, and is neurologically intact on exam aside from chronic RLE weakness.  Labs  performed in triage are without acute abnormality aside from mild hypokalemia.  Creatinine is only slightly elevated from 1.0-1.04.  White blood cell count also improving from 13-10.7.  Hemoglobin is stable.  Her symptoms are most consistent with a severe case of gastritis, she has tenderness to palpation of the epigastric region.  Her lipase is within normal limits and she did not have signs of pancreatitis on her imaging yesterday.  Given that imaging was just performed, would not repeat at this time.  She is breathing comfortably on room air, no concern for Boerhaave syndrome.  We will attempt to control patient's symptoms with IV reglan and protonix, will give a fluid bolus, will also trial a GI cocktail after reglan.  And give patient potassium repletion through IV.  If patient remains unable to tolerate p.o., she may require admission.  Patient's son [name] who is a Clinical biochemist here at [facility] [employer] is present at bedside now.  Speaking with him privately in the hallway, he states that this is happened previously when patient is very anxious and worried about something.  He states that last week her sister died unexpectedly and they buried her on Sunday.  He states that since then, she has been very nervous, and also not wanting to eat.  He states that he and his brother have not noticed actual vomiting, more dry heaving and belching from the patient.  We will keep this in mind as we continue to treat her.  Patient seen at bedside after receiving IV medications, she is now resting comfortably with IV bolus infusing.  Son also reports that she has been able to calm and is no longer retching.  After bolus, patient is still in stable condition, can discharge home.  Advised to take her blood pressure medication on returning home.   Patient again seen at bedside and reports she has been able to tolerate water.  Son also reports that she looks much improved.  Patient completed 2 runs  of potassium and was discharged home  in stable condition.  She has zofran and lorazepam at home.  Do suspect that anxiety and gastritis are both components. Recommended close f/u with PCP.  ED return precautions discussed with patient's son [name], including worsening of symptoms with inability to tolerate PO, developing fever, worsening abdominal pain that is not resolved with home medications.   Final Clinical Impression(s) / ED Diagnoses Final diagnoses:  Non-intractable vomiting with nausea, unspecified vomiting type  Gastritis without bleeding, unspecified chronicity, unspecified gastritis type  Hypokalemia    Rx / DC Orders ED Discharge Orders    None       Cleophas Dunker, DO 04/02/21 2350    Blanchie Dessert, MD 04/06/21 [phone]

## 2021-04-02 NOTE — ED Triage Notes (Signed)
Pt reports abd pain/ n/V starting a couple of days ago and was seen at [facility] yesterday and was given GI cocktail with relief of abd pain

## 2021-04-02 NOTE — ED Notes (Signed)
Pt getting second run of potassium. Pt helped to bedside commode x2 to have BM. Pt passing flatus with very little to no BM. Pt states that she feels a little better.

## 2021-04-03 NOTE — ED Notes (Signed)
Patient verbalizes understanding of discharge instructions. Opportunity for questioning and answers were provided. Armband removed by staff, pt discharged from ED via wheelchair.  

## 2021-04-23 DIAGNOSIS — R1013 Epigastric pain: Secondary | ICD-10-CM | POA: Insufficient documentation

## 2021-04-23 DIAGNOSIS — K219 Gastro-esophageal reflux disease without esophagitis: Secondary | ICD-10-CM | POA: Insufficient documentation

## 2021-04-24 ENCOUNTER — Other Ambulatory Visit: Payer: Self-pay | Admitting: Internal Medicine

## 2021-04-24 DIAGNOSIS — K219 Gastro-esophageal reflux disease without esophagitis: Secondary | ICD-10-CM

## 2021-04-24 DIAGNOSIS — R1013 Epigastric pain: Secondary | ICD-10-CM

## 2021-05-05 ENCOUNTER — Other Ambulatory Visit: Payer: Self-pay

## 2021-05-05 ENCOUNTER — Ambulatory Visit
Admission: RE | Admit: 2021-05-05 | Discharge: 2021-05-05 | Disposition: A | Discharge status: Home / Self Care | Payer: Medicare PPO | Source: Ambulatory Visit | Attending: Internal Medicine | Admitting: Internal Medicine

## 2021-05-05 DIAGNOSIS — R1013 Epigastric pain: Secondary | ICD-10-CM | POA: Diagnosis present

## 2021-05-05 DIAGNOSIS — K219 Gastro-esophageal reflux disease without esophagitis: Secondary | ICD-10-CM

## 2021-05-14 DIAGNOSIS — R4701 Aphasia: Secondary | ICD-10-CM | POA: Insufficient documentation

## 2021-07-09 ENCOUNTER — Emergency Department: Payer: Medicare PPO

## 2021-07-09 ENCOUNTER — Other Ambulatory Visit: Payer: Self-pay

## 2021-07-09 ENCOUNTER — Emergency Department
Admission: EM | Admit: 2021-07-09 | Discharge: 2021-07-09 | Disposition: A | Discharge status: Home / Self Care | Payer: Medicare PPO | Attending: Emergency Medicine | Admitting: Emergency Medicine

## 2021-07-09 DIAGNOSIS — Y9289 Other specified places as the place of occurrence of the external cause: Secondary | ICD-10-CM | POA: Insufficient documentation

## 2021-07-09 DIAGNOSIS — M25561 Pain in right knee: Secondary | ICD-10-CM | POA: Insufficient documentation

## 2021-07-09 DIAGNOSIS — R42 Dizziness and giddiness: Secondary | ICD-10-CM | POA: Diagnosis not present

## 2021-07-09 DIAGNOSIS — S42431A Displaced fracture (avulsion) of lateral epicondyle of right humerus, initial encounter for closed fracture: Secondary | ICD-10-CM

## 2021-07-09 DIAGNOSIS — Z7982 Long term (current) use of aspirin: Secondary | ICD-10-CM | POA: Insufficient documentation

## 2021-07-09 DIAGNOSIS — Z91040 Latex allergy status: Secondary | ICD-10-CM | POA: Diagnosis not present

## 2021-07-09 DIAGNOSIS — W1809XA Striking against other object with subsequent fall, initial encounter: Secondary | ICD-10-CM | POA: Diagnosis not present

## 2021-07-09 DIAGNOSIS — E039 Hypothyroidism, unspecified: Secondary | ICD-10-CM | POA: Insufficient documentation

## 2021-07-09 DIAGNOSIS — I10 Essential (primary) hypertension: Secondary | ICD-10-CM | POA: Insufficient documentation

## 2021-07-09 DIAGNOSIS — Z79899 Other long term (current) drug therapy: Secondary | ICD-10-CM | POA: Diagnosis not present

## 2021-07-09 DIAGNOSIS — S59901A Unspecified injury of right elbow, initial encounter: Secondary | ICD-10-CM | POA: Diagnosis present

## 2021-07-09 DIAGNOSIS — R110 Nausea: Secondary | ICD-10-CM | POA: Insufficient documentation

## 2021-07-09 DIAGNOSIS — Z7902 Long term (current) use of antithrombotics/antiplatelets: Secondary | ICD-10-CM | POA: Diagnosis not present

## 2021-07-09 DIAGNOSIS — W19XXXA Unspecified fall, initial encounter: Secondary | ICD-10-CM

## 2021-07-09 LAB — URINALYSIS, COMPLETE (UACMP) WITH MICROSCOPIC
Bacteria, UA: NONE SEEN
Bilirubin Urine: NEGATIVE
Glucose, UA: NEGATIVE mg/dL
Hgb urine dipstick: NEGATIVE
Ketones, ur: NEGATIVE mg/dL
Leukocytes,Ua: NEGATIVE
Nitrite: NEGATIVE
Protein, ur: NEGATIVE mg/dL
Specific Gravity, Urine: 1.006 (ref 1.005–1.030)
pH: 7 (ref 5.0–8.0)

## 2021-07-09 LAB — CBC
HCT: 35.8 % — ABNORMAL LOW (ref 36.0–46.0)
Hemoglobin: 11.8 g/dL — ABNORMAL LOW (ref 12.0–15.0)
MCH: 29.4 pg (ref 26.0–34.0)
MCHC: 33 g/dL (ref 30.0–36.0)
MCV: 89.1 fL (ref 80.0–100.0)
Platelets: 234 10*3/uL (ref 150–400)
RBC: 4.02 MIL/uL (ref 3.87–5.11)
RDW: 14.6 % (ref 11.5–15.5)
WBC: 9.7 10*3/uL (ref 4.0–10.5)
nRBC: 0 % (ref 0.0–0.2)

## 2021-07-09 LAB — BASIC METABOLIC PANEL
Anion gap: 10 (ref 5–15)
BUN: 13 mg/dL (ref 8–23)
CO2: 25 mmol/L (ref 22–32)
Calcium: 9.4 mg/dL (ref 8.9–10.3)
Chloride: 102 mmol/L (ref 98–111)
Creatinine, Ser: 1.02 mg/dL — ABNORMAL HIGH (ref 0.44–1.00)
GFR, Estimated: 57 mL/min — ABNORMAL LOW (ref >60)
Glucose, Bld: 82 mg/dL (ref 70–99)
Potassium: 3.8 mmol/L (ref 3.5–5.1)
Sodium: 137 mmol/L (ref 135–145)

## 2021-07-09 MED ORDER — ONDANSETRON 4 MG PO TBDP: 4.0000 mg | ORAL_TABLET | Freq: Three times a day (TID) | ORAL | 0 refills | Status: DC | PRN

## 2021-07-09 MED ORDER — HYDRALAZINE HCL 50 MG PO TABS
25.0000 mg | ORAL_TABLET | Freq: Once | ORAL | Status: AC
  Administered 2021-07-09: 25 mg via ORAL
  Filled 2021-07-09: qty 1

## 2021-07-09 MED ORDER — ONDANSETRON 4 MG PO TBDP
ORAL_TABLET | ORAL | Status: AC
  Filled 2021-07-09: qty 1

## 2021-07-09 MED ORDER — ONDANSETRON 4 MG PO TBDP
4.0000 mg | ORAL_TABLET | Freq: Once | ORAL | Status: AC
  Administered 2021-07-09: 4 mg via ORAL

## 2021-07-09 NOTE — ED Triage Notes (Signed)
Pt comes into the ED via [place] from home c/o vertigo.  Pt has previous diagnoses of vertigo and has home zofran for the vertigo.  Pt did take a fall today with no LOC, did not hit her head, only c/o right knee pain.  Pt neurologically intact and in NAD. 160/84, 64 HR, 100% Ra, CBG 94.

## 2021-07-09 NOTE — ED Notes (Signed)
Pt continuously retching in the waiting room, unable to vomit. Her son states that she has been feeling very light-headed all afternoon. Repeat vitals HR 72, RR 22, O2 99% on room air, BP 199-95 (MAP 127). Pt states baseline BP is 140/68. She takes medication for HTN but has not had a dose since this morning due to a dizzy spell that has made her feel too sick to tolerate PO intake. Pt experienced another dizzy spell with RN present and believed she was falling out of her wheelchair. No fall witnessed and pt is seated with RN for safety. A/O and able to answer questions appropriately although pt appears distressed. Pt states she did have a fall recently due to vertigo. PA notified of vitals and symptoms.

## 2021-07-09 NOTE — ED Notes (Signed)
Patient assisted to bedside commode, she holds on to RN and appears scared she is going to fall.  Assisted back to bed call bell in reach, bed low and locked.

## 2021-07-09 NOTE — ED Notes (Signed)
Pt actively vomiting in triage, reports took zofran PTA

## 2021-07-09 NOTE — Discharge Instructions (Addendum)
Use Tylenol for pain and fevers.  Up to 1000 mg per dose, up to 4 times per day.  Do not take more than 4000 mg of Tylenol/acetaminophen within 24 hours..  Looks like Cindy Huerta chipped off a very tiny fragment of bone in her right elbow.  This will not require surgery, sling or significant intervention.  It will heal on its own.  Use Tylenol for pain, as listed above.  I sent a fresh prescription of Zofran to the [facility] in [place]  Continue all of your other medications at home return to the ED with any further worsening symptoms

## 2021-07-09 NOTE — ED Provider Notes (Signed)
[facility] Emergency Department Provider Note ____________________________________________   Event Date/Time   First MD Initiated Contact with Patient 07/09/21 1659     (approximate)  I have reviewed the triage vital signs and the nursing notes.  HISTORY  Chief Complaint Dizziness   HPI Cindy Huerta is a 75 y.o. femalewho presents to the ED for evaluation of dizziness.   Chart review indicates hx HTN on multiple agents, on ASA and plavix, hypothyroidism.   Patient presents to the ED for evaluation of an episode of dizziness and associated fall.  Patient reports that she was in her typical state of health, when she was walking through her home when she developed vertiginous dizziness that she is accustomed to, causing her to fall to the ground and strike her right elbow and right knee.  She denies any associated syncope.  Reports her dizziness has subsided after triage Zofran and she feels much better now.  She reports some associated nausea and retching sensation in the setting of her dizziness.  Patient reports her right elbow is sore, but she ranges this in front of me and says it seems okay.  Denies any persistent dizziness or headache.  Reports that she has not taken her afternoon BP medications (hydralazine).  Past Medical History:  Diagnosis Date   Hypertension     There are no problems to display for this patient.   No past surgical history on file.  Prior to Admission medications   Medication Sig Start Date End Date Taking? Authorizing Provider  ondansetron (ZOFRAN ODT) 4 MG disintegrating tablet Take 1 tablet (4 mg total) by mouth every 8 (eight) hours as needed for nausea or vomiting. 07/09/21  Yes Vladimir Crofts, MD  acetaminophen (TYLENOL) 500 MG tablet Take 500-1,000 mg by mouth as needed for pain.    [provider]  aspirin 81 MG EC tablet Take 81 mg by mouth daily.    [provider]  atorvastatin (LIPITOR) 80 MG  tablet Take 80 mg by mouth daily. 01/15/20   [provider]  Cholecalciferol 25 MCG (1000 UT) capsule Take 1,000 Units by mouth daily.    [provider]  clopidogrel (PLAVIX) 75 MG tablet Take 75 mg by mouth daily. 11/23/19   [provider]  Cyanocobalamin (VITAMIN B-12 PO) Take 1 tablet by mouth daily.    [provider]  fexofenadine (ALLEGRA) 180 MG tablet Take 180 mg by mouth as needed for allergies.    [provider]  hydrALAZINE (APRESOLINE) 25 MG tablet Take 25 mg by mouth 3 (three) times daily. 02/04/21   [provider]  levothyroxine (SYNTHROID) 75 MCG tablet Take 75 mcg by mouth every morning. 02/10/21   [provider]  magnesium oxide (MAG-OX) 400 MG tablet Take 400 mg by mouth daily.    [provider]  meclizine (ANTIVERT) 12.5 MG tablet Take 12.5 mg by mouth as needed for dizziness.    [provider]  metoprolol succinate (TOPROL-XL) 50 MG 24 hr tablet Take 50 mg by mouth 2 (two) times daily. 03/18/21   [provider]  Multiple Vitamins-Minerals (THERA-M) TABS Take 1 tablet by mouth daily.    [provider]  ondansetron (ZOFRAN-ODT) 4 MG disintegrating tablet Take 4 mg by mouth as needed for nausea/vomiting. 12/16/20   [provider]  pantoprazole (PROTONIX) 40 MG tablet Take 40 mg by mouth daily.    [provider]  potassium chloride SA (KLOR-CON) 20 MEQ tablet Take  20 mEq by mouth daily.    [provider]  sertraline (ZOLOFT) 25 MG tablet Take 25 mg by mouth daily. 02/16/21   [provider]  telmisartan (MICARDIS) 80 MG tablet Take 80 mg by mouth daily. 10/06/18   [provider]    Allergies Latex, Lisinopril, Procaine, Diphenhydramine hcl, Clarithromycin, and Oxycodone  No family history on file.  Social History Social History   Tobacco Use   Smoking status: Never   Smokeless tobacco: Never    Review of  Systems  Constitutional: No fever/chills Eyes: No visual changes. ENT: No sore throat. Cardiovascular: Denies chest pain. Respiratory: Denies shortness of breath. Gastrointestinal: No abdominal pain.  No nausea, no vomiting.  No diarrhea.  No constipation. Genitourinary: Negative for dysuria. Musculoskeletal: Negative for back pain. Positive for right elbow and right knee pain. Skin: Negative for rash. Neurological: Negative for headaches, focal weakness or numbness.  Positive for vertiginous dizziness, resolved.  ____________________________________________   PHYSICAL EXAM:  VITAL SIGNS: Vitals:   07/09/21 1640 07/09/21 1812  BP: (!) 199/95 (!) 197/72  Pulse: 72   Resp: (!) 22   Temp:    SpO2: 99%      Constitutional: Alert and oriented. Well appearing and in no acute distress. Eyes: Conjunctivae are normal. PERRL. EOMI. Head: Atraumatic. Nose: No congestion/rhinnorhea. Mouth/Throat: Mucous membranes are moist.  Oropharynx non-erythematous. Neck: No stridor. No cervical spine tenderness to palpation. Cardiovascular: Normal rate, regular rhythm. Grossly normal heart sounds.  Good peripheral circulation. Respiratory: Normal respiratory effort.  No retractions. Lungs CTAB. Gastrointestinal: Soft , nondistended, nontender to palpation. No CVA tenderness. Musculoskeletal: No lower extremity edema.  No joint effusions.  Tiny superficial abrasion to the lateral right elbow in the anterior knee on the right.  Full active and passive ROM of all extremities without significant deformity or evidence of open injury. Neurologic:  Normal speech and language. No gross focal neurologic deficits are appreciated.  Cranial nerves II through XII intact 5/5 strength and sensation in all 4 extremities Skin:  Skin is warm, dry and intact. No rash noted. Psychiatric: Mood and affect are anxious. Speech and behavior are normal.  ____________________________________________   LABS (all labs  ordered are listed, but only abnormal results are displayed)  Labs Reviewed  BASIC METABOLIC PANEL - Abnormal; Notable for the following components:      Result Value   Creatinine, Ser 1.02 (*)    GFR, Estimated 57 (*)    All other components within normal limits  CBC - Abnormal; Notable for the following components:   Hemoglobin 11.8 (*)    HCT 35.8 (*)    All other components within normal limits  URINALYSIS, COMPLETE (UACMP) WITH MICROSCOPIC - Abnormal; Notable for the following components:   Color, Urine STRAW (*)    APPearance CLEAR (*)    All other components within normal limits   ____________________________________________  12 Lead EKG  Sinus rhythm, rate of 69 bpm.  Normal axis and intervals.  No evidence of acute ischemia. ____________________________________________  RADIOLOGY  ED MD interpretation: Plain films of the right knee and elbow reviewed by me with evidence of small chip fracture to her lateral right upper condyle CT of the head reviewed by me without evidence of acute ICH  Official radiology report(s): DG Elbow Complete Right  Result Date: 07/09/2021 CLINICAL DATA:  Fall and trauma to the right elbow. EXAM: RIGHT ELBOW - COMPLETE 3+ VIEW COMPARISON:  None. FINDINGS: There is a small minimally displaced cortical fracture of  the lateral epicondyle. No other acute fracture. There is no dislocation. The bones are well mineralized. No significant arthritic changes. No joint effusion. There is contusion of the soft tissues. No radiopaque foreign object or soft tissue gas. IMPRESSION: Minimally displaced cortical fracture of the lateral epicondyle. Electronically Signed   By: Anner Crete M.D.   On: 07/09/2021 19:18   CT HEAD WO CONTRAST (5MM)  Result Date: 07/09/2021 CLINICAL DATA:  Lightheadedness, dry heaves, dizziness, hypertension EXAM: CT HEAD WITHOUT CONTRAST TECHNIQUE: Contiguous axial images were obtained from the base of the skull through the vertex  without intravenous contrast. COMPARISON:  02/02/2021 FINDINGS: Brain: No acute infarct or hemorrhage. Chronic small vessel ischemic changes are seen throughout the white matter. Lateral ventricles and midline structures are unremarkable. No acute extra-axial fluid collections. No mass effect. Vascular: No hyperdense vessel or unexpected calcification. Skull: Normal. Negative for fracture or focal lesion. Sinuses/Orbits: No acute finding. Other: None. IMPRESSION: 1. Stable head CT, no acute process. Electronically Signed   By: Randa Ngo M.D.   On: 07/09/2021 17:32   DG Knee Complete 4 Views Right  Result Date: 07/09/2021 CLINICAL DATA:  Fall, right knee pain EXAM: RIGHT KNEE - COMPLETE 4+ VIEW COMPARISON:  None. FINDINGS: Normal alignment. No fracture or dislocation. Severe tricompartmental degenerative arthritis. No effusion. Soft tissues are unremarkable. IMPRESSION: No fracture or dislocation. Severe tricompartmental degenerative arthritis. Electronically Signed   By: Fidela Salisbury MD   On: 07/09/2021 19:24    ____________________________________________   PROCEDURES and INTERVENTIONS  Procedure(s) performed (including Critical Care):  .1-3 Lead EKG Interpretation  Date/Time: 07/09/2021 8:36 PM Performed by: Vladimir Crofts, MD Authorized by: Vladimir Crofts, MD     Interpretation: normal     ECG rate:  74   ECG rate assessment: normal     Rhythm: sinus rhythm     Ectopy: none     Conduction: normal    Medications  ondansetron (ZOFRAN-ODT) 4 MG disintegrating tablet (has no administration in time range)  ondansetron (ZOFRAN-ODT) disintegrating tablet 4 mg (4 mg Oral Given 07/09/21 1707)  hydrALAZINE (APRESOLINE) tablet 25 mg (25 mg Oral Given 07/09/21 1812)    ____________________________________________   MDM / ED COURSE   Rather anxious 75 year old woman presents from home after a typical episode of vertiginous dizziness causing the fall, with evidence of a small chip fracture off  her lateral epicondyle, but ultimately amenable to outpatient management.  Exam is generally reassuring without evidence of neurologic or vascular deficits.  No significant deformities and just a couple very small superficial abrasions to her right elbow and knee from her fall today.  Plain films with small cortical irregularity/chip fracture over her lateral epicondyle.  She has no impaired range of motion or significant pain/tenderness on examination.  Imaging otherwise is reassuring.  Blood work is unremarkable, urine without infectious features and EKG is nonischemic without evidence of cardiac dysrhythmia.  Her blood pressure is quite elevated, but she has not taken her home BP medications this afternoon.  Provide oral hydralazine with some improvement.  She is asymptomatic and requesting to go home and I see no barriers to outpatient management at this time.  I urged her to continue her home BP medication regimen, including her third dose of hydralazine today.  We discussed return precautions for the ED and patient stable for outpatient management.  Clinical Course as of 07/09/21 2036  Thu Jul 09, 2021  2012 Reassessed.  Patient resting comfortably.  BP 187/66. [DS]  Clinical Course User Index [DS] Vladimir Crofts, MD    ____________________________________________   FINAL CLINICAL IMPRESSION(S) / ED DIAGNOSES  Final diagnoses:  Dizziness  Fall, initial encounter  Displaced fracture (avulsion) of lateral epicondyle of right humerus, initial encounter for closed fracture     ED Discharge Orders          Ordered    ondansetron (ZOFRAN ODT) 4 MG disintegrating tablet  Every 8 hours PRN        07/09/21 2014             Colen Eltzroth   Note:  This document was prepared using Dragon voice recognition software and may include unintentional dictation errors.    Vladimir Crofts, MD 07/09/21 2038

## 2021-07-09 NOTE — ED Notes (Signed)
Called son [name] patient up for discharge, he states he will be here in about 20 minutes.

## 2021-07-09 NOTE — ED Notes (Signed)
Assisted patient to bedside commode. Patient states dizziness is better and is OK if she avoids quick movements. Patient assisted back to bed, call bell in reach and bed low and locked.

## 2021-07-09 NOTE — ED Triage Notes (Signed)
Pt to ED EMS for vertigo, hx vertigo, states did not have any meds to take for it.  States fell on right knee today d/t vertigo. Denies hitting head.  Alert and oriented   Has not taken all bp meds today

## 2021-07-09 NOTE — ED Notes (Signed)
Hard stick, lab contacted to collect

## 2021-07-29 ENCOUNTER — Observation Stay: Payer: Medicare PPO

## 2021-07-29 ENCOUNTER — Observation Stay
Admit: 2021-07-29 | Discharge: 2021-07-29 | Disposition: A | Discharge status: Home / Self Care | Payer: Medicare PPO | Attending: Internal Medicine | Admitting: Internal Medicine

## 2021-07-29 ENCOUNTER — Other Ambulatory Visit: Payer: Self-pay

## 2021-07-29 ENCOUNTER — Encounter: Payer: Self-pay | Admitting: Emergency Medicine

## 2021-07-29 ENCOUNTER — Emergency Department: Payer: Medicare PPO

## 2021-07-29 ENCOUNTER — Observation Stay
Admission: EM | Admit: 2021-07-29 | Discharge: 2021-07-30 | Disposition: A | Discharge status: Home / Self Care | Payer: Medicare PPO | Attending: Internal Medicine | Admitting: Internal Medicine

## 2021-07-29 ENCOUNTER — Encounter: Payer: Self-pay | Admitting: Internal Medicine

## 2021-07-29 DIAGNOSIS — R519 Headache, unspecified: Secondary | ICD-10-CM | POA: Diagnosis present

## 2021-07-29 DIAGNOSIS — Z79899 Other long term (current) drug therapy: Secondary | ICD-10-CM | POA: Diagnosis not present

## 2021-07-29 DIAGNOSIS — N1831 Chronic kidney disease, stage 3a: Secondary | ICD-10-CM | POA: Diagnosis not present

## 2021-07-29 DIAGNOSIS — E039 Hypothyroidism, unspecified: Secondary | ICD-10-CM | POA: Diagnosis present

## 2021-07-29 DIAGNOSIS — F32A Depression, unspecified: Secondary | ICD-10-CM | POA: Diagnosis present

## 2021-07-29 DIAGNOSIS — I10 Essential (primary) hypertension: Secondary | ICD-10-CM | POA: Diagnosis present

## 2021-07-29 DIAGNOSIS — I129 Hypertensive chronic kidney disease with stage 1 through stage 4 chronic kidney disease, or unspecified chronic kidney disease: Secondary | ICD-10-CM | POA: Diagnosis not present

## 2021-07-29 DIAGNOSIS — E785 Hyperlipidemia, unspecified: Secondary | ICD-10-CM | POA: Diagnosis not present

## 2021-07-29 DIAGNOSIS — I6782 Cerebral ischemia: Secondary | ICD-10-CM | POA: Diagnosis not present

## 2021-07-29 DIAGNOSIS — F418 Other specified anxiety disorders: Secondary | ICD-10-CM | POA: Diagnosis present

## 2021-07-29 DIAGNOSIS — R479 Unspecified speech disturbances: Secondary | ICD-10-CM | POA: Diagnosis not present

## 2021-07-29 DIAGNOSIS — Z7902 Long term (current) use of antithrombotics/antiplatelets: Secondary | ICD-10-CM | POA: Insufficient documentation

## 2021-07-29 DIAGNOSIS — R200 Anesthesia of skin: Secondary | ICD-10-CM | POA: Diagnosis present

## 2021-07-29 DIAGNOSIS — I669 Occlusion and stenosis of unspecified cerebral artery: Secondary | ICD-10-CM | POA: Insufficient documentation

## 2021-07-29 DIAGNOSIS — Z7982 Long term (current) use of aspirin: Secondary | ICD-10-CM | POA: Diagnosis not present

## 2021-07-29 DIAGNOSIS — Z8673 Personal history of transient ischemic attack (TIA), and cerebral infarction without residual deficits: Secondary | ICD-10-CM | POA: Insufficient documentation

## 2021-07-29 DIAGNOSIS — R531 Weakness: Principal | ICD-10-CM

## 2021-07-29 DIAGNOSIS — Z91040 Latex allergy status: Secondary | ICD-10-CM | POA: Insufficient documentation

## 2021-07-29 DIAGNOSIS — Z853 Personal history of malignant neoplasm of breast: Secondary | ICD-10-CM | POA: Insufficient documentation

## 2021-07-29 DIAGNOSIS — G459 Transient cerebral ischemic attack, unspecified: Secondary | ICD-10-CM

## 2021-07-29 DIAGNOSIS — I639 Cerebral infarction, unspecified: Secondary | ICD-10-CM

## 2021-07-29 DIAGNOSIS — Z20822 Contact with and (suspected) exposure to covid-19: Secondary | ICD-10-CM | POA: Diagnosis not present

## 2021-07-29 DIAGNOSIS — N183 Chronic kidney disease, stage 3 unspecified: Secondary | ICD-10-CM | POA: Diagnosis present

## 2021-07-29 HISTORY — DX: Depression, unspecified: F32.A

## 2021-07-29 HISTORY — DX: Hyperlipidemia, unspecified: E78.5

## 2021-07-29 HISTORY — DX: Malignant neoplasm of unspecified site of unspecified female breast: C50.919

## 2021-07-29 HISTORY — DX: Hypothyroidism, unspecified: E03.9

## 2021-07-29 LAB — COMPREHENSIVE METABOLIC PANEL
ALT: 14 U/L (ref 0–44)
AST: 25 U/L (ref 15–41)
Albumin: 3.8 g/dL (ref 3.5–5.0)
Alkaline Phosphatase: 52 U/L (ref 38–126)
Anion gap: 10 (ref 5–15)
BUN: 16 mg/dL (ref 8–23)
CO2: 26 mmol/L (ref 22–32)
Calcium: 9.6 mg/dL (ref 8.9–10.3)
Chloride: 101 mmol/L (ref 98–111)
Creatinine, Ser: 1.12 mg/dL — ABNORMAL HIGH (ref 0.44–1.00)
GFR, Estimated: 51 mL/min — ABNORMAL LOW (ref >60)
Glucose, Bld: 96 mg/dL (ref 70–99)
Potassium: 3.6 mmol/L (ref 3.5–5.1)
Sodium: 137 mmol/L (ref 135–145)
Total Bilirubin: 0.7 mg/dL (ref 0.3–1.2)
Total Protein: 7.4 g/dL (ref 6.5–8.1)

## 2021-07-29 LAB — TROPONIN I (HIGH SENSITIVITY): Troponin I (High Sensitivity): 8 ng/L (ref <18)

## 2021-07-29 LAB — URINALYSIS, COMPLETE (UACMP) WITH MICROSCOPIC
Bilirubin Urine: NEGATIVE
Glucose, UA: NEGATIVE mg/dL
Hgb urine dipstick: NEGATIVE
Ketones, ur: NEGATIVE mg/dL
Leukocytes,Ua: NEGATIVE
Nitrite: NEGATIVE
Protein, ur: NEGATIVE mg/dL
Specific Gravity, Urine: 1.004 — ABNORMAL LOW (ref 1.005–1.030)
pH: 7 (ref 5.0–8.0)

## 2021-07-29 LAB — DIFFERENTIAL
Abs Immature Granulocytes: 0.03 10*3/uL (ref 0.00–0.07)
Basophils Absolute: 0 10*3/uL (ref 0.0–0.1)
Basophils Relative: 1 %
Eosinophils Absolute: 0.3 10*3/uL (ref 0.0–0.5)
Eosinophils Relative: 3 %
Immature Granulocytes: 0 %
Lymphocytes Relative: 17 %
Lymphs Abs: 1.4 10*3/uL (ref 0.7–4.0)
Monocytes Absolute: 0.8 10*3/uL (ref 0.1–1.0)
Monocytes Relative: 10 %
Neutro Abs: 5.8 10*3/uL (ref 1.7–7.7)
Neutrophils Relative %: 69 %

## 2021-07-29 LAB — RESP PANEL BY RT-PCR (FLU A&B, COVID) ARPGX2
Influenza A by PCR: NEGATIVE
Influenza B by PCR: NEGATIVE
SARS Coronavirus 2 by RT PCR: NEGATIVE

## 2021-07-29 LAB — ECHOCARDIOGRAM COMPLETE
AR max vel: 2.78 cm2
AV Area VTI: 3.18 cm2
AV Area mean vel: 2.8 cm2
AV Mean grad: 2 mmHg
AV Peak grad: 3.7 mmHg
Ao pk vel: 0.96 m/s
Area-P 1/2: 2.62 cm2
Height: 66 in
S' Lateral: 2.6 cm
Weight: 2719.98 oz

## 2021-07-29 LAB — APTT: aPTT: 32 seconds (ref 24–36)

## 2021-07-29 LAB — CBC
HCT: 33 % — ABNORMAL LOW (ref 36.0–46.0)
Hemoglobin: 11.3 g/dL — ABNORMAL LOW (ref 12.0–15.0)
MCH: 30.2 pg (ref 26.0–34.0)
MCHC: 34.2 g/dL (ref 30.0–36.0)
MCV: 88.2 fL (ref 80.0–100.0)
Platelets: 211 10*3/uL (ref 150–400)
RBC: 3.74 MIL/uL — ABNORMAL LOW (ref 3.87–5.11)
RDW: 14.4 % (ref 11.5–15.5)
WBC: 8.3 10*3/uL (ref 4.0–10.5)
nRBC: 0 % (ref 0.0–0.2)

## 2021-07-29 LAB — PROTIME-INR
INR: 1.2 (ref 0.8–1.2)
Prothrombin Time: 15.2 seconds (ref 11.4–15.2)

## 2021-07-29 LAB — CBG MONITORING, ED: Glucose-Capillary: 105 mg/dL — ABNORMAL HIGH (ref 70–99)

## 2021-07-29 MED ORDER — LORAZEPAM 0.5 MG PO TABS
0.2500 mg | ORAL_TABLET | Freq: Two times a day (BID) | ORAL | Status: DC | PRN
  Administered 2021-07-30: 05:00:00 0.5 mg via ORAL
  Filled 2021-07-29: qty 1

## 2021-07-29 MED ORDER — SODIUM CHLORIDE 0.9 % IV SOLN: INTRAVENOUS | Status: DC

## 2021-07-29 MED ORDER — HYDRALAZINE HCL 20 MG/ML IJ SOLN
5.0000 mg | INTRAMUSCULAR | Status: DC | PRN
  Filled 2021-07-29: qty 1

## 2021-07-29 MED ORDER — CLOPIDOGREL BISULFATE 75 MG PO TABS
75.0000 mg | ORAL_TABLET | Freq: Every day | ORAL | Status: DC
  Administered 2021-07-30: 10:00:00 75 mg via ORAL
  Filled 2021-07-29: qty 1

## 2021-07-29 MED ORDER — ADULT MULTIVITAMIN W/MINERALS CH
1.0000 | ORAL_TABLET | Freq: Every day | ORAL | Status: DC
  Administered 2021-07-30: 1 via ORAL
  Filled 2021-07-29: qty 1

## 2021-07-29 MED ORDER — SENNOSIDES-DOCUSATE SODIUM 8.6-50 MG PO TABS: 1.0000 | ORAL_TABLET | Freq: Every evening | ORAL | Status: DC | PRN

## 2021-07-29 MED ORDER — STROKE: EARLY STAGES OF RECOVERY BOOK: Freq: Once | Status: AC

## 2021-07-29 MED ORDER — PANTOPRAZOLE SODIUM 40 MG PO TBEC
40.0000 mg | DELAYED_RELEASE_TABLET | Freq: Every day | ORAL | Status: DC
  Administered 2021-07-30: 10:00:00 40 mg via ORAL
  Filled 2021-07-29: qty 1

## 2021-07-29 MED ORDER — LORATADINE 10 MG PO TABS
10.0000 mg | ORAL_TABLET | Freq: Every day | ORAL | Status: DC
  Administered 2021-07-30: 10 mg via ORAL
  Filled 2021-07-29: qty 1

## 2021-07-29 MED ORDER — ASPIRIN EC 81 MG PO TBEC
81.0000 mg | DELAYED_RELEASE_TABLET | Freq: Every day | ORAL | Status: DC
  Administered 2021-07-30: 10:00:00 81 mg via ORAL
  Filled 2021-07-29: qty 1

## 2021-07-29 MED ORDER — LEVOTHYROXINE SODIUM 50 MCG PO TABS: 75.0000 ug | ORAL_TABLET | Freq: Every morning | ORAL | Status: DC

## 2021-07-29 MED ORDER — VITAMIN B-12 100 MCG PO TABS
100.0000 ug | ORAL_TABLET | Freq: Every day | ORAL | Status: DC
  Administered 2021-07-30: 100 ug via ORAL
  Filled 2021-07-29: qty 1

## 2021-07-29 MED ORDER — ACETAMINOPHEN 160 MG/5ML PO SOLN
650.0000 mg | ORAL | Status: DC | PRN
  Filled 2021-07-29: qty 20.3

## 2021-07-29 MED ORDER — ONDANSETRON HCL 4 MG/2ML IJ SOLN
4.0000 mg | Freq: Three times a day (TID) | INTRAMUSCULAR | Status: DC | PRN
  Administered 2021-07-30 (×2): 4 mg via INTRAVENOUS
  Filled 2021-07-29 (×2): qty 2

## 2021-07-29 MED ORDER — HYDRALAZINE HCL 50 MG PO TABS
25.0000 mg | ORAL_TABLET | Freq: Three times a day (TID) | ORAL | Status: DC
  Administered 2021-07-29 – 2021-07-30 (×3): 25 mg via ORAL
  Filled 2021-07-29 (×3): qty 1

## 2021-07-29 MED ORDER — IRBESARTAN 150 MG PO TABS
300.0000 mg | ORAL_TABLET | Freq: Every day | ORAL | Status: DC
  Administered 2021-07-29 – 2021-07-30 (×2): 300 mg via ORAL
  Filled 2021-07-29: qty 2

## 2021-07-29 MED ORDER — ATORVASTATIN CALCIUM 20 MG PO TABS
80.0000 mg | ORAL_TABLET | Freq: Every day | ORAL | Status: DC
  Administered 2021-07-29: 80 mg via ORAL
  Filled 2021-07-29: qty 4

## 2021-07-29 MED ORDER — ACETAMINOPHEN 325 MG PO TABS
650.0000 mg | ORAL_TABLET | ORAL | Status: DC | PRN
  Administered 2021-07-30 (×2): 650 mg via ORAL
  Filled 2021-07-29 (×2): qty 2

## 2021-07-29 MED ORDER — HYDRALAZINE HCL 20 MG/ML IJ SOLN: 5.0000 mg | INTRAMUSCULAR | Status: DC | PRN

## 2021-07-29 MED ORDER — ENOXAPARIN SODIUM 40 MG/0.4ML IJ SOSY
40.0000 mg | PREFILLED_SYRINGE | INTRAMUSCULAR | Status: DC
  Administered 2021-07-29 – 2021-07-30 (×2): 40 mg via SUBCUTANEOUS
  Filled 2021-07-29 (×2): qty 0.4

## 2021-07-29 MED ORDER — SODIUM CHLORIDE 0.9% FLUSH
3.0000 mL | Freq: Once | INTRAVENOUS | Status: AC
Start: 2021-07-29 — End: 2021-07-29
  Administered 2021-07-29: 3 mL via INTRAVENOUS

## 2021-07-29 MED ORDER — MAGNESIUM OXIDE -MG SUPPLEMENT 400 (240 MG) MG PO TABS
400.0000 mg | ORAL_TABLET | Freq: Every day | ORAL | Status: DC
  Administered 2021-07-30: 10:00:00 400 mg via ORAL
  Filled 2021-07-29: qty 1

## 2021-07-29 MED ORDER — MECLIZINE HCL 12.5 MG PO TABS
12.5000 mg | ORAL_TABLET | ORAL | Status: DC | PRN
  Filled 2021-07-29: qty 1

## 2021-07-29 MED ORDER — METOPROLOL SUCCINATE ER 50 MG PO TB24
50.0000 mg | ORAL_TABLET | Freq: Two times a day (BID) | ORAL | Status: DC
  Administered 2021-07-29 – 2021-07-30 (×2): 50 mg via ORAL
  Filled 2021-07-29 (×2): qty 1

## 2021-07-29 MED ORDER — ACETAMINOPHEN 650 MG RE SUPP: 650.0000 mg | RECTAL | Status: DC | PRN

## 2021-07-29 NOTE — ED Triage Notes (Signed)
Dr. Alfred Levins at bedside

## 2021-07-29 NOTE — Consult Note (Signed)
Neurology Consultation  Reason for Consult: Left-sided numbness, weakness, word finding difficulty Referring Physician: Dr. Blaine Hamper  CC: Left-sided numbness, weakness, word finding difficulty  History is obtained from: Chart, patient, son  HPI: Cindy Huerta is a 75 y.o. female past medical history of depression, hypertension, B12 deficiency, hypothyroidism, breast cancer, presented to the emergency room for evaluation of strokelike symptoms. Son reports that she called her this morning complaining of left-sided numbness and weakness along with a left-sided headache along with a subtle left facial droop and difficulty finding words.  Word finding difficulty has been off and on as she has been in the ER. She woke up this morning with a headache on the left side along with left facial numbness and tingling.  She has been reporting tingling going on for some time and her fingertips and toes but this facial tingling around the mouth was new.  Does not usually get migraines. Last known well was 10 PM when she went to bed and the symptoms were reported by her 5 AM when she woke up.  No chest pain or shortness of breath. Had similar episode in January for which she was seen at [facility] am unable to pull up the records in [place] they thought that she was having some sort of strokelike event for which MRIs were unremarkable and she was discharged home on aspirin and Plavix. She was also noted to have low vitamin B12 for which she is being treated with supplements. Denies any current complaints-she feels she is better but continues to have some tingling in her fingertips and toes as well as tingling around her lips more so on the left side-which has improved since the morning.  On touching her face, she does not feel any different sensation on 1 side versus the other. 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 Son does not provide any history consistent with significant short-term memory loss. At baseline, she is very independent-lives on second-floor room and walks around the house without support and once in a while would use a cane for stability.    LKW: 10 PM yesterday tpa given?: no, outside the window Premorbid modified Rankin scale (mRS): 1  ROS: Full ROS was performed and is negative except as noted in the HPI.   Past Medical History:  Diagnosis Date   Breast cancer ([place])    Depression    HLD (hyperlipidemia)    Hypertension    Hypothyroidism         Family History  Problem Relation Age of Onset   Heart disease Mother      Social History:   reports that she has never smoked. She has never used smokeless tobacco. She reports that she does not drink alcohol and does not use drugs.  Medications  Current Facility-Administered Medications:     stroke: mapping our early stages of recovery book, , Does not apply, Once, Ivor Costa, MD   0.9 %  sodium chloride infusion, , Intravenous, Continuous, Ivor Costa, MD   acetaminophen (TYLENOL) tablet 650 mg, 650 mg, Oral, Q4H PRN **OR** acetaminophen (TYLENOL) 160 MG/5ML solution 650 mg, 650 mg, Per Tube, Q4H PRN **OR** acetaminophen (TYLENOL) suppository 650 mg, 650 mg, Rectal, Q4H PRN,  Ivor Costa, MD   enoxaparin (LOVENOX) injection 40 mg, 40 mg, Subcutaneous, Q24H, Ivor Costa, MD   hydrALAZINE (APRESOLINE) injection 5 mg, 5 mg, Intravenous, Q2H PRN, Ivor Costa, MD   ondansetron [facility]) injection 4 mg, 4 mg, Intravenous, Q8H PRN, Ivor Costa, MD   senna-docusate (Senokot-S) tablet 1 tablet, 1 tablet, Oral, QHS PRN, Ivor Costa, MD  Current Outpatient Medications:    aspirin 81 MG EC tablet, Take 81 mg by mouth daily., Disp: , Rfl:    atorvastatin (LIPITOR) 80 MG tablet, Take 80  mg by mouth daily., Disp: , Rfl:    clopidogrel (PLAVIX) 75 MG tablet, Take 75 mg by mouth daily., Disp: , Rfl:    Cyanocobalamin (VITAMIN B-12 PO), Take 1 tablet by mouth daily., Disp: , Rfl:    hydrALAZINE (APRESOLINE) 25 MG tablet, Take 25 mg by mouth 3 (three) times daily., Disp: , Rfl:    levothyroxine (SYNTHROID) 75 MCG tablet, Take 75 mcg by mouth every morning., Disp: , Rfl:    LORazepam (ATIVAN) 0.5 MG tablet, Take 0.25-0.5 mg by mouth every 12 (twelve) hours as needed for anxiety., Disp: , Rfl:    magnesium oxide (MAG-OX) 400 MG tablet, Take 400 mg by mouth daily., Disp: , Rfl:    metoprolol succinate (TOPROL-XL) 50 MG 24 hr tablet, Take 50 mg by mouth 2 (two) times daily., Disp: , Rfl:    Multiple Vitamins-Minerals (THERA-M) TABS, Take 1 tablet by mouth daily., Disp: , Rfl:    pantoprazole (PROTONIX) 40 MG tablet, Take 40 mg by mouth daily., Disp: , Rfl:    potassium chloride SA (KLOR-CON) 20 MEQ tablet, Take 20 mEq by mouth daily., Disp: , Rfl:    telmisartan (MICARDIS) 80 MG tablet, Take 80 mg by mouth daily., Disp: , Rfl:    acetaminophen (TYLENOL) 500 MG tablet, Take 500-1,000 mg by mouth as needed for pain., Disp: , Rfl:    fexofenadine (ALLEGRA) 180 MG tablet, Take 180 mg by mouth as needed for allergies., Disp: , Rfl:    meclizine (ANTIVERT) 12.5 MG tablet, Take 12.5 mg by mouth as needed for dizziness., Disp: , Rfl:    ondansetron (ZOFRAN ODT) 4 MG disintegrating tablet, Take 1 tablet (4 mg total) by mouth every 8 (eight) hours as needed for nausea or vomiting., Disp: 20 tablet, Rfl: 0   sertraline (ZOLOFT) 25 MG tablet, Take 25 mg by mouth daily. (Patient not taking: No sig reported), Disp: , Rfl:    Exam: Current vital signs: BP (!) 193/70   Pulse (!) 56   Temp 98.2 F (36.8 C) (Oral)   Resp 19   Ht '5\' 6"'$  (1.676 m)   Wt 77.1 kg   SpO2 100%   BMI 27.44 kg/m  Vital signs in last 24 hours: Temp:  [98.2 F (36.8 C)] 98.2 F (36.8 C) (08/24 0622) Pulse Rate:   [56-65] 56 (08/24 1230) Resp:  [13-27] 19 (08/24 1230) BP: (172-193)/(63-79) 193/70 (08/24 1230) SpO2:  [96 %-100 %] 100 % (08/24 1230) Weight:  [77.1 kg] 77.1 kg (08/24 0623)  General: Awake alert in no distress but somewhat anxious HEENT: Normocephalic/atraumatic CVs: Regular rate rhythm Respiratory: Breathing well and saturating normally on room air Extremities are dry but cool, intact pulses. Neurological exam She is awake alert oriented x3 Her speech is extremely hypophonic-she reports that that is due to her dry throat. Son reports that she is usually very soft-spoken but this was a little worse than normal. No dysarthria No aphasia Cranial nerves:  Pupils equal round react light, extraocular movements appear intact, visual fields are full, face appears completely symmetric at rest and on smiling, facial sensation intact, auditory acuity mildly reduced bilaterally, tongue and palate midline. Motor examination: Antigravity in all fours without drift. Sensation intact to light touch on the face arm and legs Coordination: While I did not observe any dysmetria, she does have a very subtle head tremor (yes-yes motion)-when asked if this is baseline, son says she gets that off-and-on. Gait testing deferred at this time NIH stroke scale-0   Labs I have reviewed labs in epic and the results pertinent to this consultation are:   CBC    Component Value Date/Time   WBC 8.3 07/29/2021 0630   RBC 3.74 (L) 07/29/2021 0630   HGB 11.3 (L) 07/29/2021 0630   HCT 33.0 (L) 07/29/2021 0630   PLT 211 07/29/2021 0630   MCV 88.2 07/29/2021 0630   MCH 30.2 07/29/2021 0630   MCHC 34.2 07/29/2021 0630   RDW 14.4 07/29/2021 0630   LYMPHSABS 1.4 07/29/2021 0630   MONOABS 0.8 07/29/2021 0630   EOSABS 0.3 07/29/2021 0630   BASOSABS 0.0 07/29/2021 0630    CMP     Component Value Date/Time   NA 137 07/29/2021 0630   K 3.6 07/29/2021 0630   CL 101 07/29/2021 0630   CO2 26 07/29/2021 0630    GLUCOSE 96 07/29/2021 0630   BUN 16 07/29/2021 0630   CREATININE 1.12 (H) 07/29/2021 0630   CALCIUM 9.6 07/29/2021 0630   PROT 7.4 07/29/2021 0630   ALBUMIN 3.8 07/29/2021 0630   AST 25 07/29/2021 0630   ALT 14 07/29/2021 0630   ALKPHOS 52 07/29/2021 0630   BILITOT 0.7 07/29/2021 0630   GFRNONAA 51 (L) 07/29/2021 0630   Imaging I have reviewed the images obtained:  CT-head-no acute changes MRI brain-no acute infarction.  Moderate to marked chronic small vessel ischemic changes cerebral hemispheres the white matter. 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 She does not have a frank dysarthria or aphasia and her exam has been somewhat variable for different examiners. Her weakness also has been intermittent and variable. Imaging is negative for acute stroke Not sure if this truly reflects a TIA but given her risk factors-risk factor work-up is worthwhile. I would also like to see what was done for her during her last admission with similar symptoms earlier this year when she was admitted to [facility] have requested the outside records as we are unable to pull that in [place] for some reason.  Other differentials below  Impression: -Fluctuating neurological symptoms-not very impressive for TIA but she does have risk factors-proceed with stroke/TIA risk factor work-up -?Complex migraine -Less likely seizure -Evaluate for reversible causes of paresthesias such as hypothyroidism and vitamin B12 deficiency -?Psychogenic symptoms-possible if all else is unremarkable  Recommendations: Admit to hospitalist Frequent  neurochecks Continue aspirin and Plavix-for 3 months in addition to high-dose statin A1c Lipid panel 2D echo Carotid Dopplers PT OT  speech therapy Check B12 Check TSH Check RPR EEG  Will follow  Plan relayed to Dr.Niu  -- Amie Portland, MD Neurologist [facility] Pager: [phone]

## 2021-07-29 NOTE — ED Notes (Signed)
Pt assisted to bathroom. Pt taken to MRI.

## 2021-07-29 NOTE — Progress Notes (Signed)
*  PRELIMINARY RESULTS* Echocardiogram 2D Echocardiogram has been performed.  Cindy Huerta 07/29/2021, 2:14 PM

## 2021-07-29 NOTE — Progress Notes (Signed)
Eeg done 

## 2021-07-29 NOTE — Progress Notes (Signed)
*  PRELIMINARY RESULTS* Echocardiogram 2D Echocardiogram has been performed.  Sherrie Sport 07/29/2021, 2:12 PM

## 2021-07-29 NOTE — ED Notes (Signed)
Pt NAD in bed, alert. Pt states she had left facial numbness and left hand numbness on awakening this morning. LKWT last noc, approximately 2200. Pt has clear speech, equal grips/pushes/pulls and symmetry noted.

## 2021-07-29 NOTE — Procedures (Addendum)
Patient Name: Cindy Huerta  MRN: [id number]  Epilepsy Attending: Lora Havens  Referring Physician/Provider: Dr Ivor Costa Date:07/29/2021 Duration: 23.46 mins  Patient history: 75/F past history as above presenting for evaluation of sudden onset of headache left-sided facial numbness and weakness along with intermittent speech difficulties. Eeg to evaluate for seizure  Level of alertness: Awake  AEDs during EEG study: None  Technical aspects: This EEG study was done with scalp electrodes positioned according to the 10-20 International system of electrode placement. Electrical activity was acquired at a sampling rate of '500Hz'$  and reviewed with a high frequency filter of '70Hz'$  and a low frequency filter of '1Hz'$ . EEG data were recorded continuously and digitally stored.   Description: The posterior dominant rhythm consists of 8.5 Hz activity of moderate voltage (25-35 uV) seen predominantly in posterior head regions, symmetric and reactive to eye opening and eye closing. EEG showed intermittent 3 to 6 Hz theta-delta slowing in left temporal region. Physiologic photic driving was not seen during photic stimulation.  Hyperventilation was not performed.     ABNORMALITY - Intermittent slow, left temporal region.   IMPRESSION: This study is suggestive of cortical dysfunction arising from left temporal region, nonspecific etiology. No seizures or epileptiform discharges were seen throughout the recording.  [name] [name]

## 2021-07-29 NOTE — ED Notes (Signed)
OT at bedside. 

## 2021-07-29 NOTE — Procedures (Signed)
Told to wait until pt gets to room by nurse.

## 2021-07-29 NOTE — ED Notes (Signed)
Pt noted to be not talking and instead using her hands and showing RN things. When attempted to do covid swab, pt immediately states "wait a minute" without any aphasia. Pt also attempted to use restroom for sample but missed the urine hat.

## 2021-07-29 NOTE — Evaluation (Signed)
Physical Therapy Evaluation Patient Details Name: Cindy Huerta MRN: [id number] DOB: [birth year] Today's Date: 07/29/2021   History of Present Illness  Cindy Huerta is a 75 y.o. female with past medical history of hypertension, hyperlipidemia, TIA, and breast cancer who presents to the ED complaining of L sided numbness and headache.  Clinical Impression  Pt is a pleasant 75 year old female who was admitted for TIA. Pt performs bed mobility with supervision, transfers with mod I, and ambulation with cga and RW. Pt demonstrates deficits with B UE/LE strength, balance, and mobility. Reports she still feels numb in face, however all other symptoms resolved. Would benefit from skilled PT to address above deficits and promote optimal return to PLOF. Recommend OP PT for follow up to progress off RW and back to baseline AD.    Follow Up Recommendations Outpatient PT    Equipment Recommendations  None recommended by PT    Recommendations for Other Services       Precautions / Restrictions Precautions Precautions: Fall Restrictions Weight Bearing Restrictions: No      Mobility  Bed Mobility Overal bed mobility: Needs Assistance Bed Mobility: Supine to Sit     Supine to sit: Supervision Sit to supine: Supervision   General bed mobility comments: safe technique, takes extended time to perform    Transfers Overall transfer level: Modified independent Equipment used: Rolling walker (2 wheeled)             General transfer comment: Safe technique. Once standing with RW, upright posture noted  Ambulation/Gait Ambulation/Gait assistance: Min guard Gait Distance (Feet): 80 Feet Assistive device: Rolling walker (2 wheeled) Gait Pattern/deviations: Step-through pattern     General Gait Details: ambulated around room with safe technique. No LOB noted. NO dizziness  Stairs            Wheelchair Mobility    Modified Rankin (Stroke Patients Only)       Balance  Overall balance assessment: Needs assistance Sitting-balance support: Feet supported;Single extremity supported Sitting balance-Leahy Scale: Good     Standing balance support: Bilateral upper extremity supported Standing balance-Leahy Scale: Fair                               Pertinent Vitals/Pain Pain Assessment: No/denies pain    Home Living Family/patient expects to be discharged to:: Private residence Living Arrangements: Children Available Help at Discharge: Family;Available 24 hours/day (son) Type of Home: House Home Access: Stairs to enter Entrance Stairs-Rails: None Entrance Stairs-Number of Steps: 1 Home Layout: Bed/bath upstairs;Two level Home Equipment: [place] - 2 wheels      Prior Function Level of Independence: Independent with assistive device(s)         Comments: Pt reports she uses [employer] on occasion for outdoor/longer distances. She is independent for household mobility, ADL management, and IADL management. She reports going to the [facility] several times/week. She tries to stay as active and independent as possible.     Hand Dominance   Dominant Hand: Right    Extremity/Trunk Assessment   Upper Extremity Assessment Upper Extremity Assessment: Generalized weakness    Lower Extremity Assessment Lower Extremity Assessment: Generalized weakness (B LE grossly 4/5)       Communication   Communication: No difficulties  Cognition Arousal/Alertness: Awake/alert Behavior During Therapy: WFL for tasks assessed/performed Overall Cognitive Status: Within Functional Limits for tasks assessed  General Comments: pleasant and agreeable to session      General Comments      Exercises Other Exercises Other Exercises: OT facilitates toilet transfer, toileting, and standing hand hygiene. Pt education limited as MD/ECHO present to room during session. Will continue to address education on safety  and falls prevention at future sessions as appropriate.   Assessment/Plan    PT Assessment Patient needs continued PT services  PT Problem List Decreased strength;Decreased balance;Decreased mobility       PT Treatment Interventions Gait training;Stair training    PT Goals (Current goals can be found in the Care Plan section)  Acute Rehab PT Goals Patient Stated Goal: To be able to go to the gym and get back to regular routines. PT Goal Formulation: With patient Time For Goal Achievement: 08/12/21 Potential to Achieve Goals: Good    Frequency Min 2X/week   Barriers to discharge        Co-evaluation               AM-PAC PT "6 Clicks" Mobility  Outcome Measure Help needed turning from your back to your side while in a flat bed without using bedrails?: None Help needed moving from lying on your back to sitting on the side of a flat bed without using bedrails?: A Little Help needed moving to and from a bed to a chair (including a wheelchair)?: A Little Help needed standing up from a chair using your arms (e.g., wheelchair or bedside chair)?: A Little Help needed to walk in hospital room?: A Little Help needed climbing 3-5 steps with a railing? : A Little 6 Click Score: 19    End of Session Equipment Utilized During Treatment: Gait belt Activity Tolerance: Patient tolerated treatment well Patient left: in bed Nurse Communication: Mobility status PT Visit Diagnosis: Muscle weakness (generalized) (M62.81);Difficulty in walking, not elsewhere classified (R26.2)    Time: [id number] PT Time Calculation (min) (ACUTE ONLY): 13 min   Charges:   PT Evaluation $PT Eval Low Complexity: 1 Low          Greggory Stallion, PT, DPT [phone]   Cindy Huerta 07/29/2021, 4:11 PM

## 2021-07-29 NOTE — ED Provider Notes (Signed)
[facility] Emergency Department Provider Note   ____________________________________________   Event Date/Time   First MD Initiated Contact with Patient 07/29/21 0700     (approximate)  I have reviewed the triage vital signs and the nursing notes.   HISTORY  Chief Complaint Numbness    HPI Cindy Huerta is a 75 y.o. female with past medical history of hypertension, hyperlipidemia, TIA, and breast cancer who presents to the ED complaining of numbness.  History is limited as patient is currently having difficulty speaking, majority of history is obtained from her son at bedside.  He states that the patient seemed well when she went to bed at 10:00 last night, was walking without difficulty at that time and speaking normally.  She then came to wake him up this morning around 5 AM, complaining of left arm numbness, left facial numbness, and left-sided headache.  Son noticed that she seemed to be having difficulty finding her words at that time and was having a harder time walking then previous.  He reports that she had a similar episode in January of this year requiring admission, however symptoms seem to resolve over a couple of days spent in the hospital at [facility].  He states that she has been feeling well recently with no fevers, cough, chest pain, shortness of breath, abdominal pain, vomiting, diarrhea, or dysuria.        Past Medical History:  Diagnosis Date   Depression    HLD (hyperlipidemia)    Hypertension    Hypothyroidism     Patient Active Problem List   Diagnosis Date Noted   Stroke ([place]) 07/29/2021   HLD (hyperlipidemia) 07/29/2021   Hypothyroidism 07/29/2021   Hypertension    Depression    CKD (chronic kidney disease), stage IIIa     History reviewed. No pertinent surgical history.  Prior to Admission medications   Medication Sig Start Date End Date Taking? Authorizing Provider  acetaminophen (TYLENOL) 500 MG tablet Take  500-1,000 mg by mouth as needed for pain.    [provider]  aspirin 81 MG EC tablet Take 81 mg by mouth daily.    [provider]  atorvastatin (LIPITOR) 80 MG tablet Take 80 mg by mouth daily. 01/15/20   [provider]  Cholecalciferol 25 MCG (1000 UT) capsule Take 1,000 Units by mouth daily.    [provider]  clopidogrel (PLAVIX) 75 MG tablet Take 75 mg by mouth daily. 11/23/19   [provider]  Cyanocobalamin (VITAMIN B-12 PO) Take 1 tablet by mouth daily.    [provider]  fexofenadine (ALLEGRA) 180 MG tablet Take 180 mg by mouth as needed for allergies.    [provider]  hydrALAZINE (APRESOLINE) 25 MG tablet Take 25 mg by mouth 3 (three) times daily. 02/04/21   [provider]  levothyroxine (SYNTHROID) 75 MCG tablet Take 75 mcg by mouth every morning. 02/10/21   [provider]  magnesium oxide (MAG-OX) 400 MG tablet Take 400 mg by mouth daily.    [provider]  meclizine (ANTIVERT) 12.5 MG tablet Take 12.5 mg by mouth as needed for dizziness.    [provider]  metoprolol succinate (TOPROL-XL) 50 MG 24 hr tablet Take 50 mg by mouth 2 (two) times daily. 03/18/21   [provider]  Multiple Vitamins-Minerals (THERA-M) TABS Take 1 tablet by mouth daily.    [provider]  ondansetron (ZOFRAN ODT) 4 MG disintegrating tablet Take 1 tablet (4 mg  total) by mouth every 8 (eight) hours as needed for nausea or vomiting. 07/09/21   Vladimir Crofts, MD  ondansetron (ZOFRAN-ODT) 4 MG disintegrating tablet Take 4 mg by mouth as needed for nausea/vomiting. 12/16/20   [provider]  pantoprazole (PROTONIX) 40 MG tablet Take 40 mg by mouth daily.    [provider]  potassium chloride SA (KLOR-CON) 20 MEQ tablet Take 20 mEq by mouth daily.    [provider]  sertraline (ZOLOFT) 25 MG tablet Take 25 mg by mouth daily. 02/16/21   [provider]   telmisartan (MICARDIS) 80 MG tablet Take 80 mg by mouth daily. 10/06/18   [provider]    Allergies Latex, Lisinopril, Procaine, Diphenhydramine hcl, Clarithromycin, and Oxycodone  History reviewed. No pertinent family history.  Social History Social History   Tobacco Use   Smoking status: Never   Smokeless tobacco: Never    Review of Systems  Constitutional: No fever/chills Eyes: No visual changes. ENT: No sore throat. Cardiovascular: Denies chest pain. Respiratory: Denies shortness of breath. Gastrointestinal: No abdominal pain.  No nausea, no vomiting.  No diarrhea.  No constipation. Genitourinary: Negative for dysuria. Musculoskeletal: Negative for back pain. Skin: Negative for rash. Neurological: Positive for left-sided numbness.  Positive for speech difficulty.  ____________________________________________   PHYSICAL EXAM:  VITAL SIGNS: ED Triage Vitals  Enc Vitals Group     BP 07/29/21 0622 (!) 172/79     Pulse Rate 07/29/21 0622 65     Resp 07/29/21 0622 18     Temp 07/29/21 0622 98.2 F (36.8 C)     Temp Source 07/29/21 0622 Oral     SpO2 07/29/21 0622 96 %     Weight 07/29/21 0623 170 lb (77.1 kg)     Height 07/29/21 0623 '5\' 6"'$  (1.676 m)     Head Circumference --      Peak Flow --      Pain Score 07/29/21 0623 0     Pain Loc --      Pain Edu? --      Excl. in [place]? --     Constitutional: Alert and oriented to person, place, time, and situation. Eyes: Conjunctivae are normal.  Pupils equal, round, and reactive to light bilaterally. Head: Atraumatic. Nose: No congestion/rhinnorhea. Mouth/Throat: Mucous membranes are moist. Neck: Normal ROM Cardiovascular: Normal rate, regular rhythm. Grossly normal heart sounds.  2+ radial pulses bilaterally. Respiratory: Normal respiratory effort.  No retractions. Lungs CTAB. Gastrointestinal: Soft and nontender. No distention. Genitourinary: deferred Musculoskeletal: No lower extremity tenderness  nor edema. Neurologic: Stuttering speech pattern noted, no aphasia.  4+/5 strength in left lower extremity, 5 out of 5 strength in right lower extremity and bilateral upper extremities. Skin:  Skin is warm, dry and intact. No rash noted. Psychiatric: Mood and affect are normal. Speech and behavior are normal.  ____________________________________________   LABS (all labs ordered are listed, but only abnormal results are displayed)  Labs Reviewed  CBC - Abnormal; Notable for the following components:      Result Value   RBC 3.74 (*)    Hemoglobin 11.3 (*)    HCT 33.0 (*)    All other components within normal limits  COMPREHENSIVE METABOLIC PANEL - Abnormal; Notable for the following components:   Creatinine, Ser 1.12 (*)    GFR, Estimated 51 (*)    All other components within normal limits  CBG MONITORING, ED - Abnormal; Notable for the following components:   Glucose-Capillary 105 (*)  All other components within normal limits  RESP PANEL BY RT-PCR (FLU A&B, COVID) ARPGX2  PROTIME-INR  APTT  DIFFERENTIAL  URINALYSIS, COMPLETE (UACMP) WITH MICROSCOPIC  TROPONIN I (HIGH SENSITIVITY)   ____________________________________________  EKG  ED ECG REPORT I, Blake Divine, the attending physician, personally viewed and interpreted this ECG.   Date: 07/29/2021  EKG Time: 7:41  Rate: 67  Rhythm: normal sinus rhythm  Axis: Normal  Intervals:none  ST&T Change: None   PROCEDURES  Procedure(s) performed (including Critical Care):  Procedures   ____________________________________________   INITIAL IMPRESSION / ASSESSMENT AND PLAN / ED COURSE      75 year old female with past medical history of hypertension, hyperlipidemia, TIA, and breast cancer presents to the ED with left-sided numbness, weakness, and speech changes noticed just after 5 AM this morning.  While presentation is concerning for stroke, she is outside the window for tPA given last known well time of 10  PM last night.  Exam does not appear consistent with large vessel occlusion given she does not have any true aphasia, only focal finding is subtle left lower extremity weakness.  We will further assess with CT head but hold off on CT angiogram.  Labs and UA are also pending.  EKG shows no evidence of arrhythmia or ischemia.  CT head is negative for acute process, labs are also unremarkable.  On reassessment, patient speech seems to be improving and symptoms may be consistent with TIA versus stroke.  Case discussed with hospitalist for admission for further stroke work-up.      ____________________________________________   FINAL CLINICAL IMPRESSION(S) / ED DIAGNOSES  Final diagnoses:  Cerebrovascular accident (CVA), unspecified mechanism ([place])  Left facial numbness     ED Discharge Orders     None        Note:  This document was prepared using Dragon voice recognition software and may include unintentional dictation errors.    Blake Divine, MD 07/29/21 [phone]

## 2021-07-29 NOTE — ED Triage Notes (Addendum)
Pt arrived via POV with son, reports pt woke him up around 5am pt was c/o L arm numbness, L side facial numbness, L side HA.  Pt seen here a few weeks ago for dizziness. Pt also having difficulty finding her words per son.  LNW was at 10pm last when the patient went to bed.  Pt has hx of possible stroke in January 2022. Pt currently takes Aspirin and Plavix  L side grip strength decreased. Slight facial droop noted.  CBG in triage was 105

## 2021-07-29 NOTE — ED Notes (Signed)
Hospitalist at bedside 

## 2021-07-29 NOTE — ED Notes (Signed)
Pt given warm blankets per request.

## 2021-07-29 NOTE — H&P (Signed)
History and Physical    Cindy Huerta [id number] DOB: May 24, 1946 DOA: 07/29/2021  Referring MD/NP/PA:   PCP: Juluis Pitch, MD   Patient coming from:  The patient is coming from home.  At baseline, pt is independent for most of ADL.        Chief Complaint: left sided numbness ans weakness  HPI: Cindy Huerta is a 75 y.o. female with medical history significant of hypertension, hyperlipidemia, possible stroke on aspirin and Plavix, hypothyroidism, depression, CKD stage IIIa, who presents with left-sided numbness.  Per her son (I called her son by phone), patient was last known normal at about 10 PM last night.  Patient woke up with left-sided numbness and weakness, left-sided headache, left facial droop, difficulty finding words at about 5: 00 AM.  No chest pain, cough or shortness breath.  No fever or chills.  No nausea, vomiting, diarrhea or abdominal pain, no symptoms of UTI.  ED Course: pt was found to have WBC 8.3, INR 1.2, PTT 32, pending COVID PCR, stable renal function, temperature normal, blood pressure 179/72, heart rate 58, RR 23, oxygen saturation 96% - 100% on room air.  CT of head negative for acute intracranial abnormalities.  Patient is placed on MedSurg bed for observation.  Dr. Rory Percy of neurology is consulted.  MRI/MRA: No acute or subacute infarction.   Moderate to marked chronic small-vessel ischemic changes of the cerebral hemispheric white matter. Ordinary chronic small-vessel ischemic change of the pons.   Intracranial MR angiography shows distal vessel atherosclerotic narrowing and irregularity diffusely. One could question if there is a chronically occluded MCA missing branch on the right.   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  Review of Systems:   General: no fevers, chills, no body weight gain, has fatigue HEENT: no blurry vision, hearing changes or sore throat Respiratory: no dyspnea, coughing, wheezing CV: no  chest pain, no palpitations GI: no nausea, vomiting, abdominal pain, diarrhea, constipation GU: no dysuria, burning on urination, increased urinary frequency, hematuria  Ext: no leg edema Neuro: has left-sided numbness and weakness, left-sided headache, left facial droop, difficulty finding words Skin: no rash, no skin tear. MSK: No muscle spasm, no deformity, no limitation of range of movement in spin Heme: No easy bruising.  Travel history: No recent [place] distant travel.  Allergy:  Allergies  Allergen Reactions   Latex Rash   Lisinopril Cough   Procaine Other (See Comments)    Unsure - told by DDS not to let anyone give it to her  Confusion    Diphenhydramine Hcl Other (See Comments)    States she was told to not take - jitteriness and agitation Not sure of reaction    Clarithromycin Other (See Comments)    Confusion     Oxycodone Nausea And Vomiting and Anxiety    Past Medical History:  Diagnosis Date   Breast cancer ([place])    Depression    HLD (hyperlipidemia)    Hypertension    Hypothyroidism     Past Surgical History:  Procedure Laterality Date   CHOLECYSTECTOMY     left lumpectomy      Social History:  reports that she has never smoked. She has never used smokeless tobacco. She reports that she does not drink alcohol and does not use drugs.  Family History:  Family History  Problem Relation Age of Onset   Heart disease Mother      Prior to Admission medications   Medication  Sig Start Date End Date Taking? Authorizing Provider  acetaminophen (TYLENOL) 500 MG tablet Take 500-1,000 mg by mouth as needed for pain.    [provider]  aspirin 81 MG EC tablet Take 81 mg by mouth daily.    [provider]  atorvastatin (LIPITOR) 80 MG tablet Take 80 mg by mouth daily. 01/15/20   [provider]  Cholecalciferol 25 MCG (1000 UT) capsule Take 1,000 Units by mouth daily.    [provider]  clopidogrel (PLAVIX) 75 MG tablet Take  75 mg by mouth daily. 11/23/19   [provider]  Cyanocobalamin (VITAMIN B-12 PO) Take 1 tablet by mouth daily.    [provider]  fexofenadine (ALLEGRA) 180 MG tablet Take 180 mg by mouth as needed for allergies.    [provider]  hydrALAZINE (APRESOLINE) 25 MG tablet Take 25 mg by mouth 3 (three) times daily. 02/04/21   [provider]  levothyroxine (SYNTHROID) 75 MCG tablet Take 75 mcg by mouth every morning. 02/10/21   [provider]  magnesium oxide (MAG-OX) 400 MG tablet Take 400 mg by mouth daily.    [provider]  meclizine (ANTIVERT) 12.5 MG tablet Take 12.5 mg by mouth as needed for dizziness.    [provider]  metoprolol succinate (TOPROL-XL) 50 MG 24 hr tablet Take 50 mg by mouth 2 (two) times daily. 03/18/21   [provider]  Multiple Vitamins-Minerals (THERA-M) TABS Take 1 tablet by mouth daily.    [provider]  ondansetron (ZOFRAN ODT) 4 MG disintegrating tablet Take 1 tablet (4 mg total) by mouth every 8 (eight) hours as needed for nausea or vomiting. 07/09/21   Vladimir Crofts, MD  ondansetron (ZOFRAN-ODT) 4 MG disintegrating tablet Take 4 mg by mouth as needed for nausea/vomiting. 12/16/20   [provider]  pantoprazole (PROTONIX) 40 MG tablet Take 40 mg by mouth daily.    [provider]  potassium chloride SA (KLOR-CON) 20 MEQ tablet Take 20 mEq by mouth daily.    [provider]  sertraline (ZOLOFT) 25 MG tablet Take 25 mg by mouth daily. 02/16/21   [provider]  telmisartan (MICARDIS) 80 MG tablet Take 80 mg by mouth daily. 10/06/18   [provider]    Physical Exam: Vitals:   07/29/21 0630 07/29/21 0748 07/29/21 0815 07/29/21 1230  BP:  (!) 191/72 (!) 179/63 (!) 193/70  Pulse: 61 (!) 58 64 (!) 56  Resp: 13 (!) 26 (!) 27 19  Temp:      TempSrc:      SpO2: 99% 99% 100% 100%  Weight:      Height:       General: Not in acute  distress HEENT:       Eyes: PERRL, EOMI, no scleral icterus.       ENT: No discharge from the ears and nose, no pharynx injection, no tonsillar enlargement.        Neck: No JVD, no bruit, no mass felt. Heme: No neck lymph node enlargement. Cardiac: S1/S2, RRR, No murmurs, No gallops or rubs. Respiratory: No rales, wheezing, rhonchi or rubs. GI: Soft, nondistended, nontender, no rebound pain, no organomegaly, BS present. GU: No hematuria Ext: No pitting leg edema bilaterally. 1+DP/PT pulse bilaterally. Musculoskeletal: No joint deformities, No joint redness or warmth, no limitation of ROM in spin. Skin: No rashes.  Neuro: Alert, oriented X3, cranial nerves II-XII grossly intact except for difficulty speaking, muscle strength 3/5 in left arm  and leg and 5/5 in right arm and leg. Sensation to light touch intact. Brachial reflex 2+ bilaterally.  Psych: Patient is not psychotic, no suicidal or hemocidal ideation.  Labs on Admission: I have personally reviewed following labs and imaging studies  CBC: Recent Labs  Lab 07/29/21 0630  WBC 8.3  NEUTROABS 5.8  HGB 11.3*  HCT 33.0*  MCV 88.2  PLT 123456   Basic Metabolic Panel: Recent Labs  Lab 07/29/21 0630  NA 137  K 3.6  CL 101  CO2 26  GLUCOSE 96  BUN 16  CREATININE 1.12*  CALCIUM 9.6   GFR: Estimated Creatinine Clearance: 44.1 mL/min (A) (by C-G formula based on SCr of 1.12 mg/dL (H)). Liver Function Tests: Recent Labs  Lab 07/29/21 0630  AST 25  ALT 14  ALKPHOS 52  BILITOT 0.7  PROT 7.4  ALBUMIN 3.8   No results for input(s): LIPASE, AMYLASE in the last 168 hours. No results for input(s): AMMONIA in the last 168 hours. Coagulation Profile: Recent Labs  Lab 07/29/21 0630  INR 1.2   Cardiac Enzymes: No results for input(s): CKTOTAL, CKMB, CKMBINDEX, TROPONINI in the last 168 hours. BNP (last 3 results) No results for input(s): PROBNP in the last 8760 hours. HbA1C: No results for input(s): HGBA1C in the last 72  hours. CBG: Recent Labs  Lab 07/29/21 0617  GLUCAP 105*   Lipid Profile: No results for input(s): CHOL, HDL, LDLCALC, TRIG, CHOLHDL, LDLDIRECT in the last 72 hours. Thyroid Function Tests: No results for input(s): TSH, T4TOTAL, FREET4, T3FREE, THYROIDAB in the last 72 hours. Anemia Panel: No results for input(s): VITAMINB12, FOLATE, FERRITIN, TIBC, IRON, RETICCTPCT in the last 72 hours. Urine analysis:    Component Value Date/Time   COLORURINE STRAW (A) 07/29/2021 0743   APPEARANCEUR CLEAR (A) 07/29/2021 0743   LABSPEC 1.004 (L) 07/29/2021 0743   PHURINE 7.0 07/29/2021 0743   GLUCOSEU NEGATIVE 07/29/2021 0743   HGBUR NEGATIVE 07/29/2021 0743   BILIRUBINUR NEGATIVE 07/29/2021 0743   KETONESUR NEGATIVE 07/29/2021 0743   PROTEINUR NEGATIVE 07/29/2021 0743   NITRITE NEGATIVE 07/29/2021 0743   LEUKOCYTESUR NEGATIVE 07/29/2021 0743   Sepsis Labs: '@LABRCNTIP'$ (procalcitonin:4,lacticidven:4) ) Recent Results (from the past 240 hour(s))  Resp Panel by RT-PCR (Flu A&B, Covid) Nasopharyngeal Swab     Status: None   Collection Time: 07/29/21  7:43 AM   Specimen: Nasopharyngeal Swab; Nasopharyngeal(NP) swabs in vial transport medium  Result Value Ref Range Status   SARS Coronavirus 2 by RT PCR NEGATIVE NEGATIVE Final    Comment: (NOTE) SARS-CoV-2 target nucleic acids are NOT DETECTED.  The SARS-CoV-2 RNA is generally detectable in upper respiratory specimens during the acute phase of infection. The lowest concentration of SARS-CoV-2 viral copies this assay can detect is 138 copies/mL. A negative result does not preclude SARS-Cov-2 infection and should not be used as the sole basis for treatment or other patient management decisions. A negative result may occur with  improper specimen collection/handling, submission of specimen other than nasopharyngeal swab, presence of viral mutation(s) within the areas targeted by this assay, and inadequate number of viral copies(<138 copies/mL).  A negative result must be combined with clinical observations, patient history, and epidemiological information. The expected result is Negative.  Fact Sheet for Patients:  EntrepreneurPulse.com.au  Fact Sheet for Healthcare Providers:  IncredibleEmployment.be  This test is no t yet approved or cleared by the [place] FDA and  has been authorized for detection and/or diagnosis of SARS-CoV-2 by FDA under an Emergency Use  Authorization (EUA). This EUA will remain  in effect (meaning this test can be used) for the duration of the COVID-19 declaration under Section 564(b)(1) of the Act, 21 U.S.C.section 360bbb-3(b)(1), unless the authorization is terminated  or revoked sooner.       Influenza A by PCR NEGATIVE NEGATIVE Final   Influenza B by PCR NEGATIVE NEGATIVE Final    Comment: (NOTE) The Xpert Xpress SARS-CoV-2/FLU/RSV plus assay is intended as an aid in the diagnosis of influenza from Nasopharyngeal swab specimens and should not be used as a sole basis for treatment. Nasal washings and aspirates are unacceptable for Xpert Xpress SARS-CoV-2/FLU/RSV testing.  Fact Sheet for Patients: EntrepreneurPulse.com.au  Fact Sheet for Healthcare Providers: IncredibleEmployment.be  This test is not yet approved or cleared by the [place] FDA and has been authorized for detection and/or diagnosis of SARS-CoV-2 by FDA under an Emergency Use Authorization (EUA). This EUA will remain in effect (meaning this test can be used) for the duration of the COVID-19 declaration under Section 564(b)(1) of the Act, 21 U.S.C. section 360bbb-3(b)(1), unless the authorization is terminated or revoked.  Performed at [facility], [address] [place], [place] [address]      Radiological Exams on Admission: CT HEAD WO CONTRAST  Result Date: 07/29/2021 CLINICAL DATA:  75 year old female with left side  numbness and headache. Word finding difficulty. EXAM: CT HEAD WITHOUT CONTRAST TECHNIQUE: Contiguous axial images were obtained from the base of the skull through the vertex without intravenous contrast. COMPARISON:  Head CT 07/09/2021, 02/02/2021. FINDINGS: Brain: No midline shift, ventriculomegaly, mass effect, evidence of mass lesion, intracranial hemorrhage or evidence of cortically based acute infarction. Asymmetric bilateral cerebral white matter hypodensity and chronic lacunar infarct of the right caudate nucleus. Heterogeneity in the thalami appears stable since February. Stable gray-white matter differentiation throughout the brain. No cortical encephalomalacia identified. Vascular: Calcified atherosclerosis at the skull base. No suspicious intracranial vascular hyperdensity. Skull: No acute osseous abnormality identified. Sinuses/Orbits: Visualized paranasal sinuses and mastoids are stable and well aerated. Tympanic cavities remain clear. Other: Visualized orbits and scalp soft tissues are within normal limits. 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 No acute intracranial abnormality. Electronically Signed   By: Genevie Ann M.D.   On: 07/29/2021 07:45   MR ANGIO HEAD WO CONTRAST  Result Date: 07/29/2021 CLINICAL DATA:  Neuro deficit, acute, stroke suspected. Left-sided numbness and headache. Word finding difficulty. EXAM: MRI HEAD WITHOUT CONTRAST MRA HEAD WITHOUT CONTRAST TECHNIQUE: Multiplanar, multi-echo pulse sequences of the brain and surrounding structures were acquired without intravenous contrast. Angiographic images of the Circle of Willis were acquired using MRA technique without intravenous contrast. COMPARISON:  Head CT same day FINDINGS: MRI HEAD FINDINGS Brain: Diffusion imaging does not show any acute or subacute infarction. Chronic small-vessel ischemic changes affect the pons. No focal cerebellar finding. Cerebral hemispheres show moderate to marked chronic  small-vessel ischemic changes of white matter. No cortical or large vessel territory infarction. No mass lesion, hemorrhage, hydrocephalus or extra-axial collection. Vascular: Major vessels at the base of the brain show flow. Skull and upper cervical spine: Negative Sinuses/Orbits: Clear/normal Other: None MRA HEAD FINDINGS Anterior circulation: Both internal carotid arteries are patent through the skull base and siphon regions. The anterior and middle cerebral vessels are patent. There is stenosis at both A1 A2 junctions. More distal middle cerebral artery branch vessels show atherosclerotic narrowing and irregularity. One could question if there is a chronically missing right middle cerebral artery branch on the  right compared to left. Posterior circulation: Both vertebral arteries are patent to the basilar without flow limiting stenosis. Both posteroinferior cerebellar arteries appear normal. No basilar stenosis. Superior cerebellar and posterior cerebral arteries show flow. More distal PCA branch vessels show atherosclerotic irregularity. Anatomic variants: None significant. IMPRESSION: No acute or subacute infarction. Moderate to marked chronic small-vessel ischemic changes of the cerebral hemispheric white matter. Ordinary chronic small-vessel ischemic change of the pons. Intracranial MR angiography shows distal vessel atherosclerotic narrowing and irregularity diffusely. One could question if there is a chronically occluded MCA missing branch on the right. 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 Electronically Signed   By: Nelson Chimes M.D.   On: 07/29/2021 10:59   MR BRAIN WO CONTRAST  Result Date: 07/29/2021 CLINICAL DATA:  Neuro deficit, acute, stroke suspected. Left-sided numbness and headache. Word finding difficulty. EXAM: MRI HEAD WITHOUT CONTRAST MRA HEAD WITHOUT CONTRAST TECHNIQUE: Multiplanar, multi-echo pulse sequences of the brain and surrounding structures were  acquired without intravenous contrast. Angiographic images of the Circle of Willis were acquired using MRA technique without intravenous contrast. COMPARISON:  Head CT same day FINDINGS: MRI HEAD FINDINGS Brain: Diffusion imaging does not show any acute or subacute infarction. Chronic small-vessel ischemic changes affect the pons. No focal cerebellar finding. Cerebral hemispheres show moderate to marked chronic small-vessel ischemic changes of white matter. No cortical or large vessel territory infarction. No mass lesion, hemorrhage, hydrocephalus or extra-axial collection. Vascular: Major vessels at the base of the brain show flow. Skull and upper cervical spine: Negative Sinuses/Orbits: Clear/normal Other: None MRA HEAD FINDINGS Anterior circulation: Both internal carotid arteries are patent through the skull base and siphon regions. The anterior and middle cerebral vessels are patent. There is stenosis at both A1 A2 junctions. More distal middle cerebral artery branch vessels show atherosclerotic narrowing and irregularity. One could question if there is a chronically missing right middle cerebral artery branch on the right compared to left. Posterior circulation: Both vertebral arteries are patent to the basilar without flow limiting stenosis. Both posteroinferior cerebellar arteries appear normal. No basilar stenosis. Superior cerebellar and posterior cerebral arteries show flow. More distal PCA branch vessels show atherosclerotic irregularity. Anatomic variants: None significant. IMPRESSION: No acute or subacute infarction. Moderate to marked chronic small-vessel ischemic changes of the cerebral hemispheric white matter. Ordinary chronic small-vessel ischemic change of the pons. Intracranial MR angiography shows distal vessel atherosclerotic narrowing and irregularity diffusely. One could question if there is a chronically occluded MCA missing branch on the right. Stenosis of both anterior cerebral arteries at  the A1 A2 junction. Electronically Signed   By: Nelson Chimes M.D.   On: 07/29/2021 10:59   ECHOCARDIOGRAM COMPLETE  Result Date: 07/29/2021    ECHOCARDIOGRAM REPORT   Patient Name:   Kaysen A Neumeyer Date of Exam: 07/29/2021 Medical Rec #:  [id number]       Height:       66.0 in Accession #:    [id number]      Weight:       170.0 lb Date of Birth:  05/29/1946       BSA:          1.866 m Patient Age:    28 years        BP:           193/70 mmHg Patient Gender: F               HR:  56 bpm. Exam Location:  [facility] Procedure: 2D Echo, Cardiac Doppler and Color Doppler Indications:     Stroke I63.9  History:         Patient has no prior history of Echocardiogram examinations.                  Risk Factors:Hypertension and Dyslipidemia.  Sonographer:     Sherrie Sport Referring Phys:  [id number] Ivor Costa Diagnosing Phys: Yolonda Kida MD  Sonographer Comments: Suboptimal apical window. Pt could not lie down due to vertigo---echo performed with pt sitting upright in bed. IMPRESSIONS  1. Left ventricular ejection fraction, by estimation, is 55 to 60%. The left ventricle has normal function. The left ventricle has no regional wall motion abnormalities. Left ventricular diastolic parameters are consistent with Grade II diastolic dysfunction (pseudonormalization).  2. Right ventricular systolic function is normal. The right ventricular size is normal.  3. The mitral valve is normal in structure. Trivial mitral valve regurgitation.  4. The tricuspid valve is abnormal.  5. The aortic valve is normal in structure. Aortic valve regurgitation is not visualized. Conclusion(s)/Recommendation(s): Normal biventricular function without evidence of hemodynamically significant valvular heart disease. Poor windows for evaluation of left ventricular function by transthoracic echocardiography. Would recommend an alternative means of evaluation. FINDINGS  Left Ventricle: Left ventricular ejection fraction, by estimation, is 55 to 60%. The  left ventricle has normal function. The left ventricle has no regional wall motion abnormalities. The left ventricular internal cavity size was normal in size. There is  no left ventricular hypertrophy. Left ventricular diastolic parameters are consistent with Grade II diastolic dysfunction (pseudonormalization). Right Ventricle: The right ventricular size is normal. No increase in right ventricular wall thickness. Right ventricular systolic function is normal. Left Atrium: Left atrial size was normal in size. Right Atrium: Right atrial size was normal in size. Pericardium: There is no evidence of pericardial effusion. Mitral Valve: The mitral valve is normal in structure. Trivial mitral valve regurgitation. Tricuspid Valve: The tricuspid valve is abnormal. Tricuspid valve regurgitation is not demonstrated. Aortic Valve: The aortic valve is normal in structure. Aortic valve regurgitation is not visualized. Aortic valve mean gradient measures 2.0 mmHg. Aortic valve peak gradient measures 3.7 mmHg. Aortic valve area, by VTI measures 3.18 cm. Pulmonic Valve: The pulmonic valve was normal in structure. Pulmonic valve regurgitation is not visualized. Aorta: The ascending aorta was not well visualized. IAS/Shunts: No atrial level shunt detected by color flow Doppler.  LEFT VENTRICLE PLAX 2D LVIDd:         3.60 cm  Diastology LVIDs:         2.60 cm  LV e' medial:    3.59 cm/s LV PW:         0.90 cm  LV E/e' medial:  16.9 LV IVS:        1.30 cm  LV e' lateral:   5.77 cm/s LVOT diam:     2.10 cm  LV E/e' lateral: 10.5 LV SV:         63 LV SV Index:   34 LVOT Area:     3.46 cm  RIGHT VENTRICLE RV S prime:     9.79 cm/s LEFT ATRIUM             Index       RIGHT ATRIUM           Index LA diam:        3.50 cm 1.88 cm/m  RA Area:  13.10 cm LA Vol (A2C):   41.6 ml 22.29 ml/m RA Volume:   28.70 ml  15.38 ml/m LA Vol (A4C):   36.1 ml 19.34 ml/m LA Biplane Vol: 38.9 ml 20.84 ml/m  AORTIC VALVE                   PULMONIC  VALVE AV Area (Vmax):    2.78 cm    PV Vmax:        0.75 m/s AV Area (Vmean):   2.80 cm    PV Peak grad:   2.3 mmHg AV Area (VTI):     3.18 cm    RVOT Peak grad: 3 mmHg AV Vmax:           96.10 cm/s AV Vmean:          61.600 cm/s AV VTI:            0.198 m AV Peak Grad:      3.7 mmHg AV Mean Grad:      2.0 mmHg LVOT Vmax:         77.00 cm/s LVOT Vmean:        49.800 cm/s LVOT VTI:          0.182 m LVOT/AV VTI ratio: 0.92  AORTA Ao Root diam: 2.80 cm MITRAL VALVE               TRICUSPID VALVE MV Area (PHT): 2.62 cm    TR Peak grad:   6.6 mmHg MV Decel Time: 290 msec    TR Vmax:        128.00 cm/s MV E velocity: 60.80 cm/s MV A velocity: 88.30 cm/s  SHUNTS MV E/A ratio:  0.69        Systemic VTI:  0.18 m                            Systemic Diam: 2.10 cm Dwayne Prince Rome MD Electronically signed by Yolonda Kida MD Signature Date/Time: 07/29/2021/2:25:37 PM    Final      EKG: I have personally reviewed.  Sinus rhythm, QTC 445, LAD, nonspecific T wave change  Assessment/Plan Principal Problem:   Left-sided weakness Active Problems:   Hypertension   HLD (hyperlipidemia)   Hypothyroidism   Depression   CKD (chronic kidney disease), stage IIIa   Left sided numbness   Left-sided weakness and numbness: Etiology is not clear.  MRI of right is negative for acute stroke.  [place] [facility], Dr. Rory Percy. Differential diagnosis include TIA, complex migraine, seizure.   - Placed on MedSurg bed for observation - Check carotid dopplers  - Continue Plavix and add ASA   - on lipitor - fasting lipid panel and HbA1c  - 2D transthoracic echocardiography  - swallowing screen. If fails, will get SLP - PT/OT consult - EEG per Dr. Rory Percy -check B12, TSH, RPR per Dr. Rory Percy  Hypertension -IV hydralazine as needed - oral hydralazine 25 mg 3 times daily, metoprolol, irbesartan  HLD: -Lipitor  Hpothyroidism -Synthroid  Depression anxiety -Continue home as needed Ativan   CKD (chronic kidney  disease), stage IIIa: Renal function stable.  Creatinine 1.12, BUN 60 -Follow-up with BMP       DVT ppx: SQ Lovenox Code Status: Full code per his son Family Communication: Yes, patient's son by phone Disposition Plan:  Anticipate discharge back to previous environment Consults called:  Dr. Rory Percy of neurology is consulted. Admission status and Level of  care: Med-Surg:    Med-surg bed for obs    Status is: Observation  The patient remains OBS appropriate and will d/c before 2 midnights.  Dispo: The patient is from: Home              Anticipated d/c is to: Home              Patient currently is not medically stable to d/c.   Difficult to place patient No            Date of Service 07/29/2021    Ivor Costa [facility]   If 7PM-7AM, please contact night-coverage [email or web address] 07/29/2021, 3:05 PM

## 2021-07-29 NOTE — ED Notes (Signed)
MAtt RN aware of assigned bed

## 2021-07-29 NOTE — Evaluation (Signed)
Occupational Therapy Evaluation Patient Details Name: Cindy Huerta MRN: [id number] DOB: [birth year] Today's Date: 07/29/2021    History of Present Illness Cindy Huerta is a 75 y.o. female with past medical history of hypertension, hyperlipidemia, TIA, and breast cancer who presents to the ED complaining of L sided numbness and headache.   Clinical Impression   Ms. Leisure was seen for OT evaluation this date. Prior to hospital admission, pt was active and generally independent for ADL/IADL and functional mobility. She endorses using a QC for outdoor surfaces and longer distance mobility. Pt endorses 2 falls in past 6 months which she attributes to vertigo episodes. Pt lives with her son who is available to assist as needed. Currently pt demonstrates impairments including generalized weakness, decreased safety awareness, decreased balance, and decreased coordination which functionally limit her ability to perform ADL/self-care tasks. Pt currently requires MIN A for functional mobility, MIN GUARD for standing grooming tasks, and supervision for seated ADL and bed mobility.  Pt would benefit from skilled OT services to address noted impairments and functional limitations (see below for any additional details) in order to maximize safety and independence while minimizing falls risk and caregiver burden. Upon hospital discharge, recommend HHOT to maximize pt safety and return to functional independence during meaningful occupations of daily life.     Follow Up Recommendations  Home health OT;Supervision - Intermittent    Equipment Recommendations   (TBD)    Recommendations for Other Services       Precautions / Restrictions Precautions Precautions: Fall Restrictions Weight Bearing Restrictions: No      Mobility Bed Mobility Overal bed mobility: Needs Assistance Bed Mobility: Sit to Supine       Sit to supine: Supervision   General bed mobility comments: HOB elevated. Increased  time/effort to perform.    Transfers Overall transfer level: Modified independent Equipment used: 1 person hand held assist;2 person hand held assist             General transfer comment: 2 person (OT/RN) handheld assist for initial STS from EOB. Pt able to progres to 1 person MIN A for functional mobility, toilet transfer from STS, and bed mobility.    Balance Overall balance assessment: Needs assistance Sitting-balance support: Feet supported;Single extremity supported Sitting balance-Leahy Scale: Fair     Standing balance support: During functional activity;Single extremity supported;Bilateral upper extremity supported Standing balance-Leahy Scale: Fair                             ADL either performed or assessed with clinical judgement   ADL Overall ADL's : Needs assistance/impaired                                       General ADL Comments: Pt requires MIN A to perform toilet transfer. She is able to perform peri care with seated lateral leans with supervision for safety and use of grab bar near room commode. MIN Guard for standing hand washing at room sink. Supervision for bed mobility.     Vision Patient Visual Report: No change from baseline       Perception     Praxis      Pertinent Vitals/Pain Pain Assessment: No/denies pain     Hand Dominance Right   Extremity/Trunk Assessment Upper Extremity Assessment Upper Extremity Assessment: Generalized weakness (BUE grossly 3+/5. No focal weakness  appreciated during functional tasks. MD in room to see pt so formal evaluation limited this date.)   Lower Extremity Assessment Lower Extremity Assessment: Generalized weakness;Defer to PT evaluation       Communication Communication Communication: No difficulties   Cognition Arousal/Alertness: Awake/alert Behavior During Therapy: WFL for tasks assessed/performed Overall Cognitive Status: Within Functional Limits for tasks assessed                                  General Comments: Pt oriented to self, place, and situation. She is able to provide history/PLOF without difficulty. Son at bedside endorses pt's voice is softer than usual but otherwise she appears to be at baseline level of cognition.   General Comments       Exercises Other Exercises Other Exercises: OT facilitates toilet transfer, toileting, and standing hand hygiene. Pt education limited as MD/ECHO present to room during session. Will continue to address education on safety and falls prevention at future sessions as appropriate.   Shoulder Instructions      Home Living Family/patient expects to be discharged to:: Private residence Living Arrangements: Children Available Help at Discharge: Family;Available 24 hours/day                         Home Equipment: Cane - quad          Prior Functioning/Environment Level of Independence: Independent with assistive device(s)        Comments: Pt reports she uses QC on occasion for outdoor/longer distances. She is independent for household mobility, ADL management, and IADL management. She reports going to the [facility] several times/week. She tries to stay as active and independent as possible.        OT Problem List: Decreased strength;Decreased coordination;Decreased safety awareness;Impaired balance (sitting and/or standing);Decreased knowledge of use of DME or AE      OT Treatment/Interventions: Self-care/ADL training;Therapeutic exercise;Therapeutic activities;DME and/or AE instruction;Balance training;Patient/family education;Energy conservation    OT Goals(Current goals can be found in the care plan section) Acute Rehab OT Goals Patient Stated Goal: To be able to go to the gym and get back to regular routines. OT Goal Formulation: With patient Time For Goal Achievement: 08/12/21 Potential to Achieve Goals: Good  OT Frequency: Min 1X/week   Barriers to D/C:             Co-evaluation              AM-PAC OT "6 Clicks" Daily Activity     Outcome Measure Help from another person eating meals?: None Help from another person taking care of personal grooming?: A Little Help from another person toileting, which includes using toliet, bedpan, or urinal?: A Little Help from another person bathing (including washing, rinsing, drying)?: A Little Help from another person to put on and taking off regular upper body clothing?: A Little Help from another person to put on and taking off regular lower body clothing?: A Little 6 Click Score: 19   End of Session Equipment Utilized During Treatment: Gait belt Nurse Communication: Mobility status  Activity Tolerance: Patient tolerated treatment well Patient left: in bed;with call bell/phone within reach;with family/visitor present (In ED bed (no alarm); with MD present.)  OT Visit Diagnosis: Other abnormalities of gait and mobility (R26.89);Muscle weakness (generalized) (M62.81)                Time: [id number] OT Time Calculation (min): 19  min Charges:  OT General Charges $OT Visit: 1 Visit OT Evaluation $OT Eval Moderate Complexity: 1 Mod OT Treatments $Self Care/Home Management : 8-22 mins  Shara Blazing, M.S., OTR/L Ascom: [phone] 07/29/21, 2:00 PM

## 2021-07-29 NOTE — ED Notes (Signed)
Taken to CT.

## 2021-07-30 DIAGNOSIS — R531 Weakness: Secondary | ICD-10-CM | POA: Diagnosis not present

## 2021-07-30 LAB — RPR: RPR Ser Ql: NONREACTIVE

## 2021-07-30 LAB — LIPID PANEL
Cholesterol: 99 mg/dL (ref 0–200)
HDL: 37 mg/dL — ABNORMAL LOW (ref >40)
LDL Cholesterol: 48 mg/dL (ref 0–99)
Total CHOL/HDL Ratio: 2.7 RATIO
Triglycerides: 71 mg/dL (ref <150)
VLDL: 14 mg/dL (ref 0–40)

## 2021-07-30 LAB — TSH: TSH: 4.169 u[IU]/mL (ref 0.350–4.500)

## 2021-07-30 LAB — HEMOGLOBIN A1C
Hgb A1c MFr Bld: 5.7 % — ABNORMAL HIGH (ref 4.8–5.6)
Mean Plasma Glucose: 116.89 mg/dL

## 2021-07-30 LAB — VITAMIN B12: Vitamin B-12: 441 pg/mL (ref 180–914)

## 2021-07-30 MED ORDER — LEVOTHYROXINE SODIUM 50 MCG PO TABS
75.0000 ug | ORAL_TABLET | Freq: Every morning | ORAL | Status: DC
  Administered 2021-07-30: 10:00:00 75 ug via ORAL
  Filled 2021-07-30: qty 1

## 2021-07-30 MED ORDER — CYANOCOBALAMIN 1000 MCG/ML IJ SOLN
1000.0000 ug | Freq: Once | INTRAMUSCULAR | Status: AC
  Administered 2021-07-30: 1000 ug via INTRAMUSCULAR
  Filled 2021-07-30 (×2): qty 1

## 2021-07-30 MED ORDER — ATORVASTATIN CALCIUM 40 MG PO TABS: 40.0000 mg | ORAL_TABLET | Freq: Every day | ORAL | 1 refills | Status: DC

## 2021-07-30 NOTE — TOC Initial Note (Signed)
Transition of Care [facility]) - Initial/Assessment Note    Patient Details  Name: Cindy Huerta MRN: [id number] Date of Birth: 1946-02-21  [facility] CM/SW Contact:    Shelbie Hutching, RN Phone Number: 07/30/2021, 12:41 PM  Clinical Narrative:                 Patient placed in observation for left sided weakness, MOON reviewed with patient and she requests that I review it also with her son.  RNCM spoke with son , [name] via phone.  Patient lives with her son, [name] and his wife.  Patient is independent at home, she does have a cane.  Patient can drive and usually drives herself to the [facility].  PT had initially recommended OP PT but after re evaluation they feel she needs no PT follow up.   Patient will likely discharge this afternoon after neurology sees her.  Patient's son will pick her up.   Expected Discharge Plan: Home/Self Care Barriers to Discharge: Continued Medical Work up   Patient Goals and CMS Choice Patient states their goals for this hospitalization and ongoing recovery are:: Patient will be glad to go home this afternoon      Expected Discharge Plan and Services Expected Discharge Plan: Home/Self Care   Discharge Planning Services: CM Consult   Living arrangements for the past 2 months: Single Family Home                 DME Arranged: N/A DME Agency: NA       HH Arranged: NA HH Agency: NA        Prior Living Arrangements/Services Living arrangements for the past 2 months: Single Family Home Lives with:: Adult Children Patient language and need for interpreter reviewed:: Yes Do you feel safe going back to the place where you live?: Yes      Need for Family Participation in Patient Care: Yes (Comment) Care giver support system in place?: Yes (comment) (son and daughter in law) Current home services: DME (4 point cane) Criminal Activity/Legal Involvement Pertinent to Current Situation/Hospitalization: No - Comment as needed  Activities of Daily  Living Home Assistive Devices/Equipment: Dentures (specify type), Walker (specify type), Cane (specify quad or straight) ADL Screening (condition at time of admission) Patient's cognitive ability adequate to safely complete daily activities?: Yes Is the patient deaf or have difficulty hearing?: No Does the patient have difficulty seeing, even when wearing glasses/contacts?: No Does the patient have difficulty concentrating, remembering, or making decisions?: No Patient able to express need for assistance with ADLs?: Yes Does the patient have difficulty dressing or bathing?: No Independently performs ADLs?: Yes (appropriate for developmental age) Does the patient have difficulty walking or climbing stairs?: Yes Weakness of Legs: Both Weakness of Arms/Hands: None  Permission Sought/Granted Permission sought to share information with : Case Manager, Family Supports Permission granted to share information with : Yes, Verbal Permission Granted  Share Information with NAME: [name]     Permission granted to share info w Relationship: son  Permission granted to share info w Contact Information: [phone]  Emotional Assessment Appearance:: Appears stated age Attitude/Demeanor/Rapport: Engaged Affect (typically observed): Accepting Orientation: : Oriented to Self, Oriented to Place, Oriented to  Time, Oriented to Situation Alcohol / Substance Use: Not Applicable Psych Involvement: No (comment)  Admission diagnosis:  TIA (transient ischemic attack) [G45.9] Stroke [facility]) [I63.9] Left facial numbness [R20.0] Cerebrovascular accident (CVA), unspecified mechanism ([place]) [I63.9] Patient Active Problem List   Diagnosis Date Noted  Left-sided weakness 07/29/2021   HLD (hyperlipidemia) 07/29/2021   Hypothyroidism 07/29/2021   Left sided numbness 07/29/2021   Hypertension    Depression    CKD (chronic kidney disease), stage IIIa    PCP:  Juluis Pitch, MD Pharmacy:   [id number] [id number]: [phone]: [phone]    Social Determinants of Health (SDOH) Interventions    Readmission Risk Interventions No flowsheet data found.

## 2021-07-30 NOTE — Discharge Summary (Signed)
Physician Discharge Summary  Cindy Huerta [id number] DOB: [birth year] DOA: 07/29/2021  PCP: Juluis Pitch, MD  Admit date: 07/29/2021 Discharge date: 07/30/2021  Admitted From: home Disposition:  home  Recommendations for Outpatient Follow-up:  Follow up with PCP in 1-2 weeks Please obtain BMP/CBC in one week Please follow up with Neurology - Dr. Melrose Nakayama or any provider in the practice.   Home Health: No  Equipment/Devices: None   Discharge Condition: Stable  CODE STATUS: Full  Diet recommendation: Heart Healthy     Discharge Diagnoses: Principal Problem:   Left-sided weakness Active Problems:   Hypertension   HLD (hyperlipidemia)   Hypothyroidism   Depression   CKD (chronic kidney disease), stage IIIa   Left sided numbness    Summary of HPI and Hospital Course:  Per H&P by Dr. Blaine Hamper: "Cindy Huerta is a 75 y.o. female with medical history significant of hypertension, hyperlipidemia, possible stroke on aspirin and Plavix, hypothyroidism, depression, CKD stage IIIa, who presents with left-sided numbness.   Per her son (I called her son by phone), patient was last known normal at about 10 PM last night.  Patient woke up with left-sided numbness and weakness, left-sided headache, left facial droop, difficulty finding words at about 5: 00 AM.  No chest pain, cough or shortness breath.  No fever or chills.  No nausea, vomiting, diarrhea or abdominal pain, no symptoms of UTI.   ED Course: pt was found to have WBC 8.3, INR 1.2, PTT 32, pending COVID PCR, stable renal function, temperature normal, blood pressure 179/72, heart rate 58, RR 23, oxygen saturation 96% - 100% on room air.  CT of head negative for acute intracranial abnormalities.  Patient is placed on MedSurg bed for observation.  Dr. Rory Percy of neurology is consulted."   MRI/MRA: --No acute or subacute infarction. --Moderate to marked chronic small-vessel ischemic changes of the cerebral hemispheric white matter.  Ordinary chronic small-vessel ischemic change of the pons. --Intracranial MR angiography shows distal vessel atherosclerotic narrowing and irregularity diffusely. One could question if there is a chronically occluded MCA missing branch on the right. 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 U/S carotids negative for hemodynamically significant stenosis bilaterally. Echo showed EF 55-60% with grade II diastolic dysfunction. EEG negative for seizure activity.  Showed from nonspecific L temporal cortical dysfunction.    Patient had full resolution of numbness.  Reported that it comes and goes in her hands and feet frequently.    Evaluation and clinical history most consistent with TIA, although unclear.  Other problems remain in the differential as to the cause including HTN urgency, less likely seizure activity, and paresthesias related to vitamin B12 deficiency.    Neurology Plan at discharge: -Continue ASA+Plavix for 3 months, then Plavix only. -LDL 48.  Goal LDL less than 70.  Can reduce the dose of the statin to atorvastatin 40 mg. -I would recommend follow-up with neurology outpatient for TIA follow-up  -Also, if she were to have any episodes concerning for seizures, at that point, she might need initiation with antiepileptics.  No seizures reported and no structural abnormalities in the left temporal lobe-low suspicion for seizures as of right now.   Patient was given IM injection of vitamin B12 prior to discharge.    Advise close PCP follow up and neurology follow up for ongoing surveillance and any further evaluation if needed.   Discharge Instructions   Discharge Instructions  Call MD for:   Complete by: As directed    Stroke-like symptoms - if having weakness on one side of the body, trouble speaking or swallowing, trouble ambulating, worsening confusion.   Call MD for:  extreme fatigue   Complete by:  As directed    Call MD for:  persistant dizziness or light-headedness   Complete by: As directed    Call MD for:  persistant nausea and vomiting   Complete by: As directed    Call MD for:  severe uncontrolled pain   Complete by: As directed    Call MD for:  temperature >100.4   Complete by: As directed    Diet - low sodium heart healthy   Complete by: As directed    Discharge instructions   Complete by: As directed    Based on our evaluation, it is mostly likely you had a TIA ("mini stroke") that caused your symptoms.  Neurologist recommends following up with a neurologic outpatient for ongoing monitoring and evaluation.  You should continue to take both aspirin and Plavix (clopidogrel) for 3 months, then take just the Plavix.  It is possible, although seems unlikely, that seizure activity could have caused your symptoms.    The numbness and tingling you have is related to deficiency of Vitamin B12.    We gave an injection here, but you should also continue to take the oral supplement, as you have been.   Increase activity slowly   Complete by: As directed       Allergies as of 07/30/2021       Reactions   Latex Rash   Lisinopril Cough   Procaine Other (See Comments)   Unsure - told by DDS not to let anyone give it to her  Confusion   Diphenhydramine Hcl Other (See Comments)   States she was told to not take - jitteriness and agitation Not sure of reaction   Clarithromycin Other (See Comments)   Confusion   Oxycodone Nausea And Vomiting, Anxiety        Medication List     STOP taking these medications    sertraline 25 MG tablet Commonly known as: ZOLOFT       TAKE these medications    acetaminophen 500 MG tablet Commonly known as: TYLENOL Take 500-1,000 mg by mouth as needed for pain.   aspirin 81 MG EC tablet Take 81 mg by mouth daily.   atorvastatin 40 MG tablet Commonly known as: LIPITOR Take 1 tablet (40 mg total) by mouth daily. What changed:   medication strength how much to take   clopidogrel 75 MG tablet Commonly known as: PLAVIX Take 75 mg by mouth daily.   fexofenadine 180 MG tablet Commonly known as: ALLEGRA Take 180 mg by mouth as needed for allergies.   hydrALAZINE 25 MG tablet Commonly known as: APRESOLINE Take 25 mg by mouth 3 (three) times daily.   levothyroxine 75 MCG tablet Commonly known as: SYNTHROID Take 75 mcg by mouth every morning.   LORazepam 0.5 MG tablet Commonly known as: ATIVAN Take 0.25-0.5 mg by mouth every 12 (twelve) hours as needed for anxiety.   magnesium oxide 400 MG tablet Commonly known as: MAG-OX Take 400 mg by mouth daily.   meclizine 12.5 MG tablet Commonly known as: ANTIVERT Take 12.5 mg by mouth as needed for dizziness.   metoprolol succinate 50 MG 24 hr tablet Commonly known as: TOPROL-XL Take 50 mg by mouth 2 (two) times daily.   ondansetron 4 MG  disintegrating tablet Commonly known as: Zofran ODT Take 1 tablet (4 mg total) by mouth every 8 (eight) hours as needed for nausea or vomiting.   pantoprazole 40 MG tablet Commonly known as: PROTONIX Take 40 mg by mouth daily.   potassium chloride SA 20 MEQ tablet Commonly known as: KLOR-CON Take 20 mEq by mouth daily.   telmisartan 80 MG tablet Commonly known as: MICARDIS Take 80 mg by mouth daily.   Thera-M Tabs Take 1 tablet by mouth daily.   VITAMIN B-12 PO Take 1 tablet by mouth daily.        Follow-up Information     Juluis Pitch, MD. Schedule an appointment as soon as possible for a visit in 1 week(s).   Specialty: Family Medicine Why: Hospital follow up Contact information: [id number] [address] [phone]         Anabel Bene, MD. Schedule an appointment as soon as possible for a visit.   Specialty: Neurology Why: Follow up for possible TIA's Contact information: [facility] [place] [address] [phone]                 Allergies  Allergen Reactions   Latex Rash   Lisinopril Cough   Procaine Other (See Comments)    Unsure - told by DDS not to let anyone give it to her  Confusion    Diphenhydramine Hcl Other (See Comments)    States she was told to not take - jitteriness and agitation Not sure of reaction    Clarithromycin Other (See Comments)    Confusion     Oxycodone Nausea And Vomiting and Anxiety     If you experience worsening of your admission symptoms, develop shortness of breath, life threatening emergency, suicidal or homicidal thoughts you must seek medical attention immediately by calling 911 or calling your MD immediately  if symptoms less severe.    Please note   You were cared for by a hospitalist during your hospital stay. If you have any questions about your discharge medications or the care you received while you were in the hospital after you are discharged, you can call the unit and asked to speak with the hospitalist on call if the hospitalist that took care of you is not available. Once you are discharged, your primary care physician will handle any further medical issues. Please note that NO REFILLS for any discharge medications will be authorized once you are discharged, as it is imperative that you return to your primary care physician (or establish a relationship with a primary care physician if you do not have one) for your aftercare needs so that they can reassess your need for medications and monitor your lab values.   Consultations: Neurology    Procedures/Studies: DG Elbow Complete Right  Result Date: 07/09/2021 CLINICAL DATA:  Fall and trauma to the right elbow. EXAM: RIGHT ELBOW - COMPLETE 3+ VIEW COMPARISON:  None. FINDINGS: There is a small minimally displaced cortical fracture of the lateral epicondyle. No other acute fracture. There is no dislocation. The bones are well mineralized. No significant arthritic changes. No joint effusion. There is  contusion of the soft tissues. No radiopaque foreign object or soft tissue gas. IMPRESSION: Minimally displaced cortical fracture of the lateral epicondyle. Electronically Signed   By: Anner Crete M.D.   On: 07/09/2021 19:18   CT HEAD WO CONTRAST  Result Date: 07/29/2021 CLINICAL DATA:  75 year old female with left side numbness and  headache. Word finding difficulty. EXAM: CT HEAD WITHOUT CONTRAST TECHNIQUE: Contiguous axial images were obtained from the base of the skull through the vertex without intravenous contrast. COMPARISON:  Head CT 07/09/2021, 02/02/2021. FINDINGS: Brain: No midline shift, ventriculomegaly, mass effect, evidence of mass lesion, intracranial hemorrhage or evidence of cortically based acute infarction. Asymmetric bilateral cerebral white matter hypodensity and chronic lacunar infarct of the right caudate nucleus. Heterogeneity in the thalami appears stable since February. Stable gray-white matter differentiation throughout the brain. No cortical encephalomalacia identified. Vascular: Calcified atherosclerosis at the skull base. No suspicious intracranial vascular hyperdensity. Skull: No acute osseous abnormality identified. Sinuses/Orbits: Visualized paranasal sinuses and mastoids are stable and well aerated. Tympanic cavities remain clear. Other: Visualized orbits and scalp soft tissues are within normal limits. 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 No acute intracranial abnormality. Electronically Signed   By: Genevie Ann M.D.   On: 07/29/2021 07:45   CT HEAD WO CONTRAST (5MM)  Result Date: 07/09/2021 CLINICAL DATA:  Lightheadedness, dry heaves, dizziness, hypertension EXAM: CT HEAD WITHOUT CONTRAST TECHNIQUE: Contiguous axial images were obtained from the base of the skull through the vertex without intravenous contrast. COMPARISON:  02/02/2021 FINDINGS: Brain: No acute infarct or hemorrhage. Chronic small vessel ischemic changes are seen throughout  the white matter. Lateral ventricles and midline structures are unremarkable. No acute extra-axial fluid collections. No mass effect. Vascular: No hyperdense vessel or unexpected calcification. Skull: Normal. Negative for fracture or focal lesion. Sinuses/Orbits: No acute finding. Other: None. IMPRESSION: 1. Stable head CT, no acute process. Electronically Signed   By: Randa Ngo M.D.   On: 07/09/2021 17:32   MR ANGIO HEAD WO CONTRAST  Result Date: 07/29/2021 CLINICAL DATA:  Neuro deficit, acute, stroke suspected. Left-sided numbness and headache. Word finding difficulty. EXAM: MRI HEAD WITHOUT CONTRAST MRA HEAD WITHOUT CONTRAST TECHNIQUE: Multiplanar, multi-echo pulse sequences of the brain and surrounding structures were acquired without intravenous contrast. Angiographic images of the Circle of Willis were acquired using MRA technique without intravenous contrast. COMPARISON:  Head CT same day FINDINGS: MRI HEAD FINDINGS Brain: Diffusion imaging does not show any acute or subacute infarction. Chronic small-vessel ischemic changes affect the pons. No focal cerebellar finding. Cerebral hemispheres show moderate to marked chronic small-vessel ischemic changes of white matter. No cortical or large vessel territory infarction. No mass lesion, hemorrhage, hydrocephalus or extra-axial collection. Vascular: Major vessels at the base of the brain show flow. Skull and upper cervical spine: Negative Sinuses/Orbits: Clear/normal Other: None MRA HEAD FINDINGS Anterior circulation: Both internal carotid arteries are patent through the skull base and siphon regions. The anterior and middle cerebral vessels are patent. There is stenosis at both A1 A2 junctions. More distal middle cerebral artery branch vessels show atherosclerotic narrowing and irregularity. One could question if there is a chronically missing right middle cerebral artery branch on the right compared to left. Posterior circulation: Both vertebral  arteries are patent to the basilar without flow limiting stenosis. Both posteroinferior cerebellar arteries appear normal. No basilar stenosis. Superior cerebellar and posterior cerebral arteries show flow. More distal PCA branch vessels show atherosclerotic irregularity. Anatomic variants: None significant. IMPRESSION: No acute or subacute infarction. Moderate to marked chronic small-vessel ischemic changes of the cerebral hemispheric white matter. Ordinary chronic small-vessel ischemic change of the pons. Intracranial MR angiography shows distal vessel atherosclerotic narrowing and irregularity diffusely. One could question if there is a chronically occluded MCA missing branch on the right. Stenosis of both anterior cerebral arteries at the A1  A2 junction. Electronically Signed   By: Nelson Chimes M.D.   On: 07/29/2021 10:59   MR BRAIN WO CONTRAST  Result Date: 07/29/2021 CLINICAL DATA:  Neuro deficit, acute, stroke suspected. Left-sided numbness and headache. Word finding difficulty. EXAM: MRI HEAD WITHOUT CONTRAST MRA HEAD WITHOUT CONTRAST TECHNIQUE: Multiplanar, multi-echo pulse sequences of the brain and surrounding structures were acquired without intravenous contrast. Angiographic images of the Circle of Willis were acquired using MRA technique without intravenous contrast. COMPARISON:  Head CT same day FINDINGS: MRI HEAD FINDINGS Brain: Diffusion imaging does not show any acute or subacute infarction. Chronic small-vessel ischemic changes affect the pons. No focal cerebellar finding. Cerebral hemispheres show moderate to marked chronic small-vessel ischemic changes of white matter. No cortical or large vessel territory infarction. No mass lesion, hemorrhage, hydrocephalus or extra-axial collection. Vascular: Major vessels at the base of the brain show flow. Skull and upper cervical spine: Negative Sinuses/Orbits: Clear/normal Other: None MRA HEAD FINDINGS Anterior circulation: Both internal carotid  arteries are patent through the skull base and siphon regions. The anterior and middle cerebral vessels are patent. There is stenosis at both A1 A2 junctions. More distal middle cerebral artery branch vessels show atherosclerotic narrowing and irregularity. One could question if there is a chronically missing right middle cerebral artery branch on the right compared to left. Posterior circulation: Both vertebral arteries are patent to the basilar without flow limiting stenosis. Both posteroinferior cerebellar arteries appear normal. No basilar stenosis. Superior cerebellar and posterior cerebral arteries show flow. More distal PCA branch vessels show atherosclerotic irregularity. Anatomic variants: None significant. IMPRESSION: No acute or subacute infarction. Moderate to marked chronic small-vessel ischemic changes of the cerebral hemispheric white matter. Ordinary chronic small-vessel ischemic change of the pons. Intracranial MR angiography shows distal vessel atherosclerotic narrowing and irregularity diffusely. One could question if there is a chronically occluded MCA missing branch on the right. 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 Electronically Signed   By: Nelson Chimes M.D.   On: 07/29/2021 10:59   US Carotid Bilateral  Result Date: 07/29/2021 CLINICAL DATA:  TIA.  Hypertension, syncope, hyperlipidemia EXAM: BILATERAL CAROTID DUPLEX ULTRASOUND TECHNIQUE: Pearline Cables scale imaging, color Doppler and duplex ultrasound were performed of bilateral carotid and vertebral arteries in the neck. COMPARISON:  None. FINDINGS: Criteria: Quantification of carotid stenosis is based on velocity parameters that correlate the residual internal carotid diameter with NASCET-based stenosis levels, using the diameter of the distal internal carotid lumen as the denominator for stenosis measurement. The following velocity measurements were obtained: RIGHT ICA: 93/62 cm/sec CCA: [id number] cm/sec SYSTOLIC  ICA/CCA RATIO:  1.4 ECA: 69 cm/sec LEFT ICA: 72/9 cm/sec CCA: [id number] cm/sec SYSTOLIC ICA/CCA RATIO:  1 ECA: 69 cm/sec RIGHT CAROTID ARTERY: Minimal calcific plaque formation demonstrated in the carotid bulb. No grayscale evidence of significant stenosis. Normal homogeneous flow demonstrated on color flow Doppler imaging. Normal velocity waveforms. RIGHT VERTEBRAL ARTERY:  Patent with antegrade flow direction. LEFT CAROTID ARTERY: Mild calcific plaque formation demonstrated in the carotid bulb. No grayscale evidence of significant stenosis. Normal homogeneous flow demonstrated on color flow Doppler imaging. Normal velocity waveforms. LEFT VERTEBRAL ARTERY:  Patent with antegrade flow direction. IMPRESSION: No evidence of hemodynamically significant stenosis of either right or the left internal carotid artery. Electronically Signed   By: Lucienne Capers M.D.   On: 07/29/2021 18:06   DG Knee Complete 4 Views Right  Result Date: 07/09/2021 CLINICAL DATA:  Fall, right knee pain EXAM: RIGHT KNEE -  COMPLETE 4+ VIEW COMPARISON:  None. FINDINGS: Normal alignment. No fracture or dislocation. Severe tricompartmental degenerative arthritis. No effusion. Soft tissues are unremarkable. IMPRESSION: No fracture or dislocation. Severe tricompartmental degenerative arthritis. Electronically Signed   By: Fidela Salisbury MD   On: 07/09/2021 19:24   EEG adult  Result Date: 07/29/2021 Lora Havens, MD     07/29/2021  9:22 PM Patient Name: Cindy Huerta MRN: [id number] Epilepsy Attending: Lora Havens Referring Physician/Provider: Dr Ivor Costa Date:07/29/2021 Duration: 23.46 mins Patient history: 75/F past history as above presenting for evaluation of sudden onset of headache left-sided facial numbness and weakness along with intermittent speech difficulties. Eeg to evaluate for seizure Level of alertness: Awake AEDs during EEG study: None Technical aspects: This EEG study was done with scalp electrodes positioned according  to the 10-20 International system of electrode placement. Electrical activity was acquired at a sampling rate of '500Hz'$  and reviewed with a high frequency filter of '70Hz'$  and a low frequency filter of '1Hz'$ . EEG data were recorded continuously and digitally stored. Description: The posterior dominant rhythm consists of 8.5 Hz activity of moderate voltage (25-35 uV) seen predominantly in posterior head regions, symmetric and reactive to eye opening and eye closing. EEG showed intermittent 3 to 6 Hz theta-delta slowing in left temporal region. Physiologic photic driving was not seen during photic stimulation.  Hyperventilation was not performed.   ABNORMALITY - Intermittent slow, left temporal region. IMPRESSION: This study is suggestive of cortical dysfunction arising from left temporal region, nonspecific etiology. No seizures or epileptiform discharges were seen throughout the recording. Lora Havens   ECHOCARDIOGRAM COMPLETE  Result Date: 07/29/2021    ECHOCARDIOGRAM REPORT   Patient Name:   Cindy Huerta Date of Exam: 07/29/2021 Medical Rec #:  [id number]       Height:       66.0 in Accession #:    [id number]      Weight:       170.0 lb Date of Birth:  Dec 13, 1945       BSA:          1.866 m Patient Age:    41 years        BP:           193/70 mmHg Patient Gender: F               HR:           56 bpm. Exam Location:  [facility] Procedure: 2D Echo, Cardiac Doppler and Color Doppler Indications:     Stroke I63.9  History:         Patient has no prior history of Echocardiogram examinations.                  Risk Factors:Hypertension and Dyslipidemia.  Sonographer:     Sherrie Sport Referring Phys:  [id number] Ivor Costa Diagnosing Phys: Yolonda Kida MD  Sonographer Comments: Suboptimal apical window. Pt could not lie down due to vertigo---echo performed with pt sitting upright in bed. IMPRESSIONS  1. Left ventricular ejection fraction, by estimation, is 55 to 60%. The left ventricle has normal function. The left ventricle  has no regional wall motion abnormalities. Left ventricular diastolic parameters are consistent with Grade II diastolic dysfunction (pseudonormalization).  2. Right ventricular systolic function is normal. The right ventricular size is normal.  3. The mitral valve is normal in structure. Trivial mitral valve regurgitation.  4. The tricuspid valve is abnormal.  5. The aortic valve  is normal in structure. Aortic valve regurgitation is not visualized. Conclusion(s)/Recommendation(s): Normal biventricular function without evidence of hemodynamically significant valvular heart disease. Poor windows for evaluation of left ventricular function by transthoracic echocardiography. Would recommend an alternative means of evaluation. FINDINGS  Left Ventricle: Left ventricular ejection fraction, by estimation, is 55 to 60%. The left ventricle has normal function. The left ventricle has no regional wall motion abnormalities. The left ventricular internal cavity size was normal in size. There is  no left ventricular hypertrophy. Left ventricular diastolic parameters are consistent with Grade II diastolic dysfunction (pseudonormalization). Right Ventricle: The right ventricular size is normal. No increase in right ventricular wall thickness. Right ventricular systolic function is normal. Left Atrium: Left atrial size was normal in size. Right Atrium: Right atrial size was normal in size. Pericardium: There is no evidence of pericardial effusion. Mitral Valve: The mitral valve is normal in structure. Trivial mitral valve regurgitation. Tricuspid Valve: The tricuspid valve is abnormal. Tricuspid valve regurgitation is not demonstrated. Aortic Valve: The aortic valve is normal in structure. Aortic valve regurgitation is not visualized. Aortic valve mean gradient measures 2.0 mmHg. Aortic valve peak gradient measures 3.7 mmHg. Aortic valve area, by VTI measures 3.18 cm. Pulmonic Valve: The pulmonic valve was normal in structure.  Pulmonic valve regurgitation is not visualized. Aorta: The ascending aorta was not well visualized. IAS/Shunts: No atrial level shunt detected by color flow Doppler.  LEFT VENTRICLE PLAX 2D LVIDd:         3.60 cm  Diastology LVIDs:         2.60 cm  LV e' medial:    3.59 cm/s LV PW:         0.90 cm  LV E/e' medial:  16.9 LV IVS:        1.30 cm  LV e' lateral:   5.77 cm/s LVOT diam:     2.10 cm  LV E/e' lateral: 10.5 LV SV:         63 LV SV Index:   34 LVOT Area:     3.46 cm  RIGHT VENTRICLE RV S prime:     9.79 cm/s LEFT ATRIUM             Index       RIGHT ATRIUM           Index LA diam:        3.50 cm 1.88 cm/m  RA Area:     13.10 cm LA Vol (A2C):   41.6 ml 22.29 ml/m RA Volume:   28.70 ml  15.38 ml/m LA Vol (A4C):   36.1 ml 19.34 ml/m LA Biplane Vol: 38.9 ml 20.84 ml/m  AORTIC VALVE                   PULMONIC VALVE AV Area (Vmax):    2.78 cm    PV Vmax:        0.75 m/s AV Area (Vmean):   2.80 cm    PV Peak grad:   2.3 mmHg AV Area (VTI):     3.18 cm    RVOT Peak grad: 3 mmHg AV Vmax:           96.10 cm/s AV Vmean:          61.600 cm/s AV VTI:            0.198 m AV Peak Grad:      3.7 mmHg AV Mean Grad:      2.0 mmHg LVOT Vmax:  77.00 cm/s LVOT Vmean:        49.800 cm/s LVOT VTI:          0.182 m LVOT/AV VTI ratio: 0.92  AORTA Ao Root diam: 2.80 cm MITRAL VALVE               TRICUSPID VALVE MV Area (PHT): 2.62 cm    TR Peak grad:   6.6 mmHg MV Decel Time: 290 msec    TR Vmax:        128.00 cm/s MV E velocity: 60.80 cm/s MV A velocity: 88.30 cm/s  SHUNTS MV E/A ratio:  0.69        Systemic VTI:  0.18 m                            Systemic Diam: 2.10 cm Dwayne Prince Rome MD Electronically signed by Yolonda Kida MD Signature Date/Time: 07/29/2021/2:25:37 PM    Final       Subjective: Pt seen with son at bedside today. She reports the left-side weakness resolved.  [place] better.  Reports this waxing and waning tingling sensations and recently started on B12.  Asks about injection of B12.  Did  well with PT and OT.    Patient's son reported that he and his brother have noticed patient's similar episodes like this seem to happen with shifts in the weather.     Discharge Exam: Vitals:   07/30/21 0839 07/30/21 1250  BP: (!) 174/78 (!) 184/71  Pulse: 62 60  Resp: 18 18  Temp: 98.2 F (36.8 C) (!) 97.4 F (36.3 C)  SpO2: 100% 100%   Vitals:   07/30/21 0000 07/30/21 0426 07/30/21 0839 07/30/21 1250  BP: (!) 156/74 (!) 141/70 (!) 174/78 (!) 184/71  Pulse: 69 70 62 60  Resp: '14 15 18 18  '$ Temp: 98.1 F (36.7 C) 97.7 F (36.5 C) 98.2 F (36.8 C) (!) 97.4 F (36.3 C)  TempSrc:   Oral Oral  SpO2: 96% 100% 100% 100%  Weight:      Height:        General: Pt is alert, awake, not in acute distress Cardiovascular: RRR, S1/S2 +, no rubs, no gallops Respiratory: CTA bilaterally, no wheezing, no rhonchi Abdominal: Soft, NT, ND, bowel sounds + Extremities: no edema, no cyanosis    The results of significant diagnostics from this hospitalization (including imaging, microbiology, ancillary and laboratory) are listed below for reference.     Microbiology: Recent Results (from the past 240 hour(s))  Resp Panel by RT-PCR (Flu A&B, Covid) Nasopharyngeal Swab     Status: None   Collection Time: 07/29/21  7:43 AM   Specimen: Nasopharyngeal Swab; Nasopharyngeal(NP) swabs in vial transport medium  Result Value Ref Range Status   SARS Coronavirus 2 by RT PCR NEGATIVE NEGATIVE Final    Comment: (NOTE) SARS-CoV-2 target nucleic acids are NOT DETECTED.  The SARS-CoV-2 RNA is generally detectable in upper respiratory specimens during the acute phase of infection. The lowest concentration of SARS-CoV-2 viral copies this assay can detect is 138 copies/mL. A negative result does not preclude SARS-Cov-2 infection and should not be used as the sole basis for treatment or other patient management decisions. A negative result may occur with  improper specimen collection/handling, submission  of specimen other than nasopharyngeal swab, presence of viral mutation(s) within the areas targeted by this assay, and inadequate number of viral copies(<138 copies/mL). A negative result must be combined with clinical  observations, patient history, and epidemiological information. The expected result is Negative.  Fact Sheet for Patients:  EntrepreneurPulse.com.au  Fact Sheet for Healthcare Providers:  IncredibleEmployment.be  This test is no t yet approved or cleared by the [place] FDA and  has been authorized for detection and/or diagnosis of SARS-CoV-2 by FDA under an Emergency Use Authorization (EUA). This EUA will remain  in effect (meaning this test can be used) for the duration of the COVID-19 declaration under Section 564(b)(1) of the Act, 21 U.S.C.section 360bbb-3(b)(1), unless the authorization is terminated  or revoked sooner.       Influenza A by PCR NEGATIVE NEGATIVE Final   Influenza B by PCR NEGATIVE NEGATIVE Final    Comment: (NOTE) The Xpert Xpress SARS-CoV-2/FLU/RSV plus assay is intended as an aid in the diagnosis of influenza from Nasopharyngeal swab specimens and should not be used as a sole basis for treatment. Nasal washings and aspirates are unacceptable for Xpert Xpress SARS-CoV-2/FLU/RSV testing.  Fact Sheet for Patients: EntrepreneurPulse.com.au  Fact Sheet for Healthcare Providers: IncredibleEmployment.be  This test is not yet approved or cleared by the [place] FDA and has been authorized for detection and/or diagnosis of SARS-CoV-2 by FDA under an Emergency Use Authorization (EUA). This EUA will remain in effect (meaning this test can be used) for the duration of the COVID-19 declaration under Section 564(b)(1) of the Act, 21 U.S.C. section 360bbb-3(b)(1), unless the authorization is terminated or revoked.  Performed at [facility], [address] [place], [place] [address]      Labs: BNP (last 3 results) No results for input(s): BNP in the last 8760 hours. Basic Metabolic Panel: Recent Labs  Lab 07/29/21 0630  NA 137  K 3.6  CL 101  CO2 26  GLUCOSE 96  BUN 16  CREATININE 1.12*  CALCIUM 9.6   Liver Function Tests: Recent Labs  Lab 07/29/21 0630  AST 25  ALT 14  ALKPHOS 52  BILITOT 0.7  PROT 7.4  ALBUMIN 3.8   No results for input(s): LIPASE, AMYLASE in the last 168 hours. No results for input(s): AMMONIA in the last 168 hours. CBC: Recent Labs  Lab 07/29/21 0630  WBC 8.3  NEUTROABS 5.8  HGB 11.3*  HCT 33.0*  MCV 88.2  PLT 211   Cardiac Enzymes: No results for input(s): CKTOTAL, CKMB, CKMBINDEX, TROPONINI in the last 168 hours. BNP: Invalid input(s): POCBNP CBG: Recent Labs  Lab 07/29/21 0617  GLUCAP 105*   D-Dimer No results for input(s): DDIMER in the last 72 hours. Hgb A1c Recent Labs    07/30/21 0453  HGBA1C 5.7*   Lipid Profile Recent Labs    07/30/21 0453  CHOL 99  HDL 37*  LDLCALC 48  TRIG 71  CHOLHDL 2.7   Thyroid function studies Recent Labs    07/30/21 0453  TSH 4.169   Anemia work up No results for input(s): VITAMINB12, FOLATE, FERRITIN, TIBC, IRON, RETICCTPCT in the last 72 hours. Urinalysis    Component Value Date/Time   COLORURINE STRAW (A) 07/29/2021 0743   APPEARANCEUR CLEAR (A) 07/29/2021 0743   LABSPEC 1.004 (L) 07/29/2021 0743   PHURINE 7.0 07/29/2021 0743   GLUCOSEU NEGATIVE 07/29/2021 0743   HGBUR NEGATIVE 07/29/2021 0743   BILIRUBINUR NEGATIVE 07/29/2021 0743   KETONESUR NEGATIVE 07/29/2021 0743   PROTEINUR NEGATIVE 07/29/2021 0743   NITRITE NEGATIVE 07/29/2021 0743   LEUKOCYTESUR NEGATIVE 07/29/2021 0743   Sepsis Labs Invalid input(s): PROCALCITONIN,  WBC,  LACTICIDVEN Microbiology Recent Results (from  the past 240 hour(s))  Resp Panel by RT-PCR (Flu A&B, Covid) Nasopharyngeal Swab     Status: None   Collection Time: 07/29/21  7:43 AM    Specimen: Nasopharyngeal Swab; Nasopharyngeal(NP) swabs in vial transport medium  Result Value Ref Range Status   SARS Coronavirus 2 by RT PCR NEGATIVE NEGATIVE Final    Comment: (NOTE) SARS-CoV-2 target nucleic acids are NOT DETECTED.  The SARS-CoV-2 RNA is generally detectable in upper respiratory specimens during the acute phase of infection. The lowest concentration of SARS-CoV-2 viral copies this assay can detect is 138 copies/mL. A negative result does not preclude SARS-Cov-2 infection and should not be used as the sole basis for treatment or other patient management decisions. A negative result may occur with  improper specimen collection/handling, submission of specimen other than nasopharyngeal swab, presence of viral mutation(s) within the areas targeted by this assay, and inadequate number of viral copies(<138 copies/mL). A negative result must be combined with clinical observations, patient history, and epidemiological information. The expected result is Negative.  Fact Sheet for Patients:  EntrepreneurPulse.com.au  Fact Sheet for Healthcare Providers:  IncredibleEmployment.be  This test is no t yet approved or cleared by the [place] FDA and  has been authorized for detection and/or diagnosis of SARS-CoV-2 by FDA under an Emergency Use Authorization (EUA). This EUA will remain  in effect (meaning this test can be used) for the duration of the COVID-19 declaration under Section 564(b)(1) of the Act, 21 U.S.C.section 360bbb-3(b)(1), unless the authorization is terminated  or revoked sooner.       Influenza A by PCR NEGATIVE NEGATIVE Final   Influenza B by PCR NEGATIVE NEGATIVE Final    Comment: (NOTE) The Xpert Xpress SARS-CoV-2/FLU/RSV plus assay is intended as an aid in the diagnosis of influenza from Nasopharyngeal swab specimens and should not be used as a sole basis for treatment. Nasal washings and aspirates are  unacceptable for Xpert Xpress SARS-CoV-2/FLU/RSV testing.  Fact Sheet for Patients: EntrepreneurPulse.com.au  Fact Sheet for Healthcare Providers: IncredibleEmployment.be  This test is not yet approved or cleared by the [place] FDA and has been authorized for detection and/or diagnosis of SARS-CoV-2 by FDA under an Emergency Use Authorization (EUA). This EUA will remain in effect (meaning this test can be used) for the duration of the COVID-19 declaration under Section 564(b)(1) of the Act, 21 U.S.C. section 360bbb-3(b)(1), unless the authorization is terminated or revoked.  Performed at [facility], [address] [place], [place] [address]      Time coordinating discharge: Over 30 minutes  SIGNED:   Ezekiel Slocumb, DO [facility] 07/30/2021, 2:24 PM   If 7PM-7AM, please contact night-coverage [email or web address]

## 2021-07-30 NOTE — Progress Notes (Signed)
Occupational Therapy Treatment & Discharge  Patient Details Name: FABIANNA KEATS MRN: [id number] DOB: [birth year] Today's Date: 07/30/2021    History of present illness LENOLA LOCKNER is a 75 y.o. female with past medical history of hypertension, hyperlipidemia, TIA, and breast cancer who presents to the ED complaining of L sided numbness and headache.   OT comments  Pt seen for OT tx this date. Family present. Pt endorses feeling much better today. Pt performed ADL transfers with supervision for safety, negotiated obstacles in the room while ambulating to/from bathroom without AD and no LOB, supervision from OT for safety 2/2 need to bring HR monitor system. Pt completed all aspects of toileting with remote supervision, no difficulty. Pt left in bed, all needs in reach. Pt/family in agreement, no additional skilled OT needs at this time. Will sign off in house.    Follow Up Recommendations  No OT follow up    Equipment Recommendations  None recommended by OT    Recommendations for Other Services      Precautions / Restrictions Precautions Precautions: Fall Restrictions Weight Bearing Restrictions: No       Mobility Bed Mobility Overal bed mobility: Needs Assistance Bed Mobility: Supine to Sit;Sit to Supine     Supine to sit: Supervision Sit to supine: Supervision   General bed mobility comments: sitting EOB at entry    Transfers Overall transfer level: Modified independent Equipment used: None             General transfer comment: begins with QC, then just carries it as it hinders her mobility    Balance Overall balance assessment: No apparent balance deficits (not formally assessed)                                         ADL either performed or assessed with clinical judgement   ADL Overall ADL's : Needs assistance/impaired                                       General ADL Comments: Pt requires remote supervision for  all aspects of toileting, supervision for ambulating from bed to bathroom and back. Pt endorses feeling much better.     Vision       Perception     Praxis      Cognition Arousal/Alertness: Awake/alert Behavior During Therapy: WFL for tasks assessed/performed Overall Cognitive Status: Within Functional Limits for tasks assessed                                          Exercises     Shoulder Instructions       General Comments      Pertinent Vitals/ Pain       Pain Assessment: No/denies pain  Home Living                                          Prior Functioning/Environment              Frequency           Progress Toward Goals  OT Goals(current goals can  now be found in the care plan section)  Progress towards OT goals: Goals met/education completed, patient discharged from OT  Acute Rehab OT Goals Patient Stated Goal: To be able to go to the gym and get back to regular routines. OT Goal Formulation: All assessment and education complete, DC therapy  Plan All goals met and education completed, patient discharged from OT services;Discharge plan needs to be updated    Co-evaluation                 AM-PAC OT "6 Clicks" Daily Activity     Outcome Measure   Help from another person eating meals?: None Help from another person taking care of personal grooming?: None Help from another person toileting, which includes using toliet, bedpan, or urinal?: None Help from another person bathing (including washing, rinsing, drying)?: None Help from another person to put on and taking off regular upper body clothing?: None Help from another person to put on and taking off regular lower body clothing?: None 6 Click Score: 24    End of Session    OT Visit Diagnosis: Other abnormalities of gait and mobility (R26.89);Muscle weakness (generalized) (M62.81)   Activity Tolerance Patient tolerated treatment well   Patient  Left in bed;with call bell/phone within reach;with family/visitor present   Nurse Communication          Time: 1642-9037 OT Time Calculation (min): 12 min  Charges: OT General Charges $OT Visit: 1 Visit OT Treatments $Self Care/Home Management : 8-22 mins  Hanley Hays, MPH, MS, OTR/L ascom [phone] 07/30/21, 1:24 PM

## 2021-07-30 NOTE — Progress Notes (Signed)
Neurology Progress Note   S:// Seen and examined. Numbness resolved. Blood pressures have fluctuated from systolic of 1 [id number].  O:// Current vital signs: BP (!) 174/78 (BP Location: Right Arm)   Pulse 62   Temp 98.2 F (36.8 C) (Oral)   Resp 18   Ht '5\' 6"'$  (1.676 m)   Wt 77.1 kg   SpO2 100%   BMI 27.44 kg/m  Vital signs in last 24 hours: Temp:  [97.7 F (36.5 C)-98.4 F (36.9 C)] 98.2 F (36.8 C) (08/25 0839) Pulse Rate:  [60-75] 62 (08/25 0839) Resp:  [14-23] 18 (08/25 0839) BP: (113-184)/(66-91) 174/78 (08/25 0839) SpO2:  [96 %-100 %] 100 % (08/25 0839) General: Awake alert oriented x3 no distress HNT: Normocephalic atraumatic CVs: Regular rhythm Respiratory: Breathing well saturating normally on room air Abdomen nondistended nontender Extremities warm well perfused Neurologic exam Awake alert oriented x3 Less hypophonic today but extremely soft tone of voice. No dysarthria No evidence of aphasia Cranial nerves II to XII intact with the exception of diminished auditory acuity bilaterally Motor examination with antigravity strength in all fours Coordination: No dysmetria.  Continues to have a very subtle tremor NIH stroke scale remains 0 Medications  Current Facility-Administered Medications:    acetaminophen (TYLENOL) tablet 650 mg, 650 mg, Oral, Q4H PRN, 650 mg at 07/30/21 0511 **OR** acetaminophen (TYLENOL) 160 MG/5ML solution 650 mg, 650 mg, Per Tube, Q4H PRN **OR** acetaminophen (TYLENOL) suppository 650 mg, 650 mg, Rectal, Q4H PRN, Ivor Costa, MD   aspirin EC tablet 81 mg, 81 mg, Oral, Daily, Ivor Costa, MD, 81 mg at 07/30/21 0947   atorvastatin (LIPITOR) tablet 80 mg, 80 mg, Oral, QHS, Niu, Soledad Gerlach, MD, 80 mg at 07/29/21 2035   clopidogrel (PLAVIX) tablet 75 mg, 75 mg, Oral, Daily, Ivor Costa, MD, 75 mg at 07/30/21 0947   enoxaparin (LOVENOX) injection 40 mg, 40 mg, Subcutaneous, Q24H, Ivor Costa, MD, 40 mg at 07/30/21 [id number]   hydrALAZINE (APRESOLINE)  injection 5 mg, 5 mg, Intravenous, Q2H PRN, Ivor Costa, MD   hydrALAZINE (APRESOLINE) tablet 25 mg, 25 mg, Oral, TID, Ivor Costa, MD, 25 mg at 07/30/21 0947   irbesartan (AVAPRO) tablet 300 mg, 300 mg, Oral, Daily, Ivor Costa, MD, 300 mg at 07/30/21 [id number]   levothyroxine (SYNTHROID) tablet 75 mcg, 75 mcg, Oral, q morning, Nicole Kindred A, DO, 75 mcg at 07/30/21 0958   loratadine (CLARITIN) tablet 10 mg, 10 mg, Oral, Daily, Ivor Costa, MD, 10 mg at 07/30/21 0947   LORazepam (ATIVAN) tablet 0.25-0.5 mg, 0.25-0.5 mg, Oral, Q12H PRN, Ivor Costa, MD, 0.5 mg at 07/30/21 0511   magnesium oxide (MAG-OX) tablet 400 mg, 400 mg, Oral, Daily, Ivor Costa, MD, 400 mg at 07/30/21 [id number]   meclizine (ANTIVERT) tablet 12.5 mg, 12.5 mg, Oral, PRN, Ivor Costa, MD   metoprolol succinate (TOPROL-XL) 24 hr tablet 50 mg, 50 mg, Oral, BID, Ivor Costa, MD, 50 mg at 07/30/21 [id number]   multivitamin with minerals tablet 1 tablet, 1 tablet, Oral, Daily, Ivor Costa, MD, 1 tablet at 07/30/21 0948   ondansetron [facility]) injection 4 mg, 4 mg, Intravenous, Q8H PRN, Ivor Costa, MD, 4 mg at 07/30/21 0523   pantoprazole (PROTONIX) EC tablet 40 mg, 40 mg, Oral, Daily, Ivor Costa, MD, 40 mg at 07/30/21 0946   senna-docusate (Senokot-S) tablet 1 tablet, 1 tablet, Oral, QHS PRN, Ivor Costa, MD   vitamin B-12 (CYANOCOBALAMIN) tablet 100 mcg, 100 mcg, Oral, Daily, Ivor Costa, MD, 100 mcg at 07/30/21 0947 Labs CBC  Component Value Date/Time   WBC 8.3 07/29/2021 0630   RBC 3.74 (L) 07/29/2021 0630   HGB 11.3 (L) 07/29/2021 0630   HCT 33.0 (L) 07/29/2021 0630   PLT 211 07/29/2021 0630   MCV 88.2 07/29/2021 0630   MCH 30.2 07/29/2021 0630   MCHC 34.2 07/29/2021 0630   RDW 14.4 07/29/2021 0630   LYMPHSABS 1.4 07/29/2021 0630   MONOABS 0.8 07/29/2021 0630   EOSABS 0.3 07/29/2021 0630   BASOSABS 0.0 07/29/2021 0630    CMP     Component Value Date/Time   NA 137 07/29/2021 0630   K 3.6 07/29/2021 0630   CL 101 07/29/2021 0630    CO2 26 07/29/2021 0630   GLUCOSE 96 07/29/2021 0630   BUN 16 07/29/2021 0630   CREATININE 1.12 (H) 07/29/2021 0630   CALCIUM 9.6 07/29/2021 0630   PROT 7.4 07/29/2021 0630   ALBUMIN 3.8 07/29/2021 0630   AST 25 07/29/2021 0630   ALT 14 07/29/2021 0630   ALKPHOS 52 07/29/2021 0630   BILITOT 0.7 07/29/2021 0630   GFRNONAA 51 (L) 07/29/2021 0630    glycosylated hemoglobin-5.7 LDL 48  Lipid Panel     Component Value Date/Time   CHOL 99 07/30/2021 0453   TRIG 71 07/30/2021 0453   HDL 37 (L) 07/30/2021 0453   CHOLHDL 2.7 07/30/2021 0453   VLDL 14 07/30/2021 0453   LDLCALC 48 07/30/2021 0453   TSH 4.2 RPR negative B12 pending B1 also ordered by the primary service/pending  2D echo: LVEF 55 to 60% with no regional wall motion abnormalities, she has grade 2 diastolic dysfunction, normal mitral valve, normal left atrial size, no atrial level shunt detected by color Doppler.  EEG showed intermittent slowing in the left temporal region.  No seizures or epileptiform discharges.  Imaging I have reviewed images in epic and the results pertinent to this consultation are: MRI brain reviewed yesterday-no acute infarction.  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 Carotid Dopplers: No evidence of hemodynamically significant stenosis in either the right of the internal carotid artery.  Patent with anterograde flow in the vertebral arteries bilaterally.  Assessment:  75 year old with sudden onset of headache left-sided facial numbness and weakness along with intermittent speech difficulties. Has had intermittent fluctuations with her blood pressures. Stereotypic symptoms and exam with only subjective weakness and somewhat inconsistent for different examiner's makes TIAs less likely. What is interesting is that her EEG showed some intermittent slowing in the left temporal function-theoretically those abnormalities can explain speech difficulties but not the left-sided  numbness and left-sided weakness symptoms. She is also had fluctuating blood pressures with systolics in the low 0000000 to high 180s. At this point my differentials remain as below: - TIA - Hypertensive urgency - Less likely to be seizure but sure does have abnormal EEG with intermittent left temporal slowing without seizures recorded on routine EEG - Paresthesias from B12 deficiency  Recommendations: -Continue ASA+Plavix for 3 months by Plavix only. -LDL 48.  Goal LDL less than 70.  Can reduce the dose of the statin to atorvastatin 40 mg. -I would recommend follow-up with neurology outpatient for TIA follow-up  -Also, if she were to have any episodes concerning for seizures, at that point, she might need initiation with antiepileptics.  No seizures reported and no structural abnormalities in the left temporal lobe-low suspicion for seizures as of right now. -c/w B12 replenishment  Plan relayed to Dr. Arbutus Ped  -- Amie Portland, MD Neurologist [facility] Pager: [phone]

## 2021-07-30 NOTE — Care Management Obs Status (Signed)
[facility] NOTIFICATION   Patient Details  Name: BRIDGIT KLIMA MRN: [id number] Date of Birth: [birth year]   Medicare Observation Status Notification Given:  Yes    Shelbie Hutching, RN 07/30/2021, 11:13 AM

## 2021-07-30 NOTE — Progress Notes (Signed)
SLP Cancellation Note  Patient Details Name: Cindy Huerta MRN: [id number] DOB: December 09, 1945   Cancelled treatment:       Reason Eval/Treat Not Completed: SLP screened, no needs identified, will sign off (chart reviewed; consulted NSG then met w/ pt in room). Per chart, MRI brain - "no acute infarction. Moderate to marked chronic small vessel ischemic changes cerebral hemispheres the white matter. 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 Neurology noted no significant short-term memory loss, but unsure of pt's Baseline Cognitive status. Pt is soft spoken at home/presently. She answered general questions re: self and Sons, home though she did not clearly state that she lived w/ one of her Sons. She followed basic commands. Pt denied any difficulty swallowing and is currently on a regular diet; tolerates swallowing pills w/ water one at a time "like at home", per NSG. Pt conversed in general conversation w/out overt expressive/receptive deficits noted; pt denied any speech-language deficits. Speech clear and intelligible. Unsure if pt's cognitive-linguistic presentation could be related to MRI presentation and any potential Cognitive decline.  Recommend f/u w/ Neurology for formal Cognitive assessment if any further questions.  No further skilled, acute ST services indicated as pt appears at her baseline per her report and NSG/chart notes. Pt agreed. NSG to reconsult if any change in status while admitted.      Orinda Kenner, MS, CCC-SLP Speech Language Pathologist Rehab Services [phone] [facility] 07/30/2021, 1:18 PM

## 2021-07-30 NOTE — Progress Notes (Signed)
Physical Therapy Treatment Patient Details Name: Cindy Huerta MRN: [id number] DOB: [birth year] Today's Date: 07/30/2021    History of Present Illness Cindy Huerta is a 75 y.o. female with past medical history of hypertension, hyperlipidemia, TIA, and breast cancer who presents to the ED complaining of L sided numbness and headache.    PT Comments    Pt at EOB upon entry, agreeable to session. Pt reports feeling much better today, but does have some wry neck from last night. Pt AMB and transferring at baseline level of function, AMB limited community distances with QC and without. No additional services needed at this time. Not recommending any PT services at DC. Will sign off at this time.      Follow Up Recommendations  No PT follow up     Equipment Recommendations  None recommended by PT    Recommendations for Other Services       Precautions / Restrictions Precautions Precautions: Fall Restrictions Weight Bearing Restrictions: No    Mobility  Bed Mobility               General bed mobility comments: sitting EOB at entry    Transfers Overall transfer level: Independent Equipment used: None             General transfer comment: begins with QC, then just carries it as it hinders her mobility  Ambulation/Gait Ambulation/Gait assistance: Independent Gait Distance (Feet): 320 Feet Assistive device: Quad cane;None Gait Pattern/deviations: Step-through pattern Gait velocity: 0.24ms       Stairs             Wheelchair Mobility    Modified Rankin (Stroke Patients Only)       Balance Overall balance assessment: Independent                                          Cognition Arousal/Alertness: Awake/alert Behavior During Therapy: WFL for tasks assessed/performed Overall Cognitive Status: Within Functional Limits for tasks assessed                                        Exercises      General  Comments        Pertinent Vitals/Pain Pain Assessment: No/denies pain    Home Living                      Prior Function            PT Goals (current goals can now be found in the care plan section) Acute Rehab PT Goals Patient Stated Goal: To be able to go to the gym and get back to regular routines. PT Goal Formulation: With patient Time For Goal Achievement: 08/12/21 Potential to Achieve Goals: Good Progress towards PT goals: Goals met/education completed, patient discharged from PT    Frequency    Min 2X/week      PT Plan Discharge plan needs to be updated    Co-evaluation              AM-PAC PT "6 Clicks" Mobility   Outcome Measure  Help needed turning from your back to your side while in a flat bed without using bedrails?: None Help needed moving from lying on your back to sitting on the side of a  flat bed without using bedrails?: None Help needed moving to and from a bed to a chair (including a wheelchair)?: None Help needed standing up from a chair using your arms (e.g., wheelchair or bedside chair)?: None Help needed to walk in hospital room?: None Help needed climbing 3-5 steps with a railing? : A Little 6 Click Score: 23    End of Session Equipment Utilized During Treatment: Gait belt Activity Tolerance: Patient tolerated treatment well;No increased pain Patient left: in bed;with family/visitor present Nurse Communication: Mobility status PT Visit Diagnosis: Muscle weakness (generalized) (M62.81);Difficulty in walking, not elsewhere classified (R26.2)     Time: 0352-4818 PT Time Calculation (min) (ACUTE ONLY): 16 min  Charges:  $Gait Training: 8-22 mins                    10:39 AM, 07/30/21 Etta Grandchild, PT, DPT Physical Therapist - [facility]  [phone] ([place])     Ross C 07/30/2021, 10:36 AM

## 2021-08-03 LAB — VITAMIN B1: Vitamin B1 (Thiamine): 90.8 nmol/L (ref 66.5–200.0)

## 2021-08-31 ENCOUNTER — Other Ambulatory Visit: Payer: Self-pay | Admitting: Family Medicine

## 2021-08-31 DIAGNOSIS — N6320 Unspecified lump in the left breast, unspecified quadrant: Secondary | ICD-10-CM

## 2021-09-01 ENCOUNTER — Inpatient Hospital Stay
Admission: RE | Admit: 2021-09-01 | Discharge: 2021-09-01 | Disposition: A | Discharge status: Home / Self Care | Payer: Self-pay | Source: Ambulatory Visit | Attending: *Deleted | Admitting: *Deleted

## 2021-09-01 ENCOUNTER — Other Ambulatory Visit: Payer: Self-pay | Admitting: *Deleted

## 2021-09-01 DIAGNOSIS — Z1231 Encounter for screening mammogram for malignant neoplasm of breast: Secondary | ICD-10-CM

## 2021-09-11 ENCOUNTER — Ambulatory Visit
Admission: RE | Admit: 2021-09-11 | Discharge: 2021-09-11 | Disposition: A | Discharge status: Home / Self Care | Payer: Medicare PPO | Source: Ambulatory Visit | Attending: Family Medicine | Admitting: Family Medicine

## 2021-09-11 ENCOUNTER — Other Ambulatory Visit: Payer: Self-pay

## 2021-09-11 DIAGNOSIS — N6320 Unspecified lump in the left breast, unspecified quadrant: Secondary | ICD-10-CM | POA: Insufficient documentation

## 2021-09-11 DIAGNOSIS — N6325 Unspecified lump in the left breast, overlapping quadrants: Secondary | ICD-10-CM | POA: Diagnosis not present

## 2021-09-17 ENCOUNTER — Other Ambulatory Visit: Payer: Self-pay | Admitting: Family Medicine

## 2021-09-17 DIAGNOSIS — N6320 Unspecified lump in the left breast, unspecified quadrant: Secondary | ICD-10-CM

## 2021-09-17 DIAGNOSIS — R928 Other abnormal and inconclusive findings on diagnostic imaging of breast: Secondary | ICD-10-CM

## 2021-09-23 ENCOUNTER — Ambulatory Visit
Admission: RE | Admit: 2021-09-23 | Discharge: 2021-09-23 | Disposition: A | Discharge status: Home / Self Care | Payer: Medicare PPO | Source: Ambulatory Visit | Attending: Family Medicine | Admitting: Family Medicine

## 2021-09-23 ENCOUNTER — Other Ambulatory Visit: Payer: Self-pay

## 2021-09-23 DIAGNOSIS — N6320 Unspecified lump in the left breast, unspecified quadrant: Secondary | ICD-10-CM

## 2021-09-23 DIAGNOSIS — R928 Other abnormal and inconclusive findings on diagnostic imaging of breast: Secondary | ICD-10-CM

## 2021-09-23 HISTORY — PX: BREAST BIOPSY: SHX20

## 2021-09-28 DIAGNOSIS — C50919 Malignant neoplasm of unspecified site of unspecified female breast: Secondary | ICD-10-CM

## 2021-10-01 LAB — SURGICAL PATHOLOGY

## 2021-10-06 ENCOUNTER — Other Ambulatory Visit: Payer: Self-pay

## 2021-10-06 ENCOUNTER — Encounter: Payer: Self-pay | Admitting: Emergency Medicine

## 2021-10-06 ENCOUNTER — Inpatient Hospital Stay: Payer: Medicare PPO | Attending: Internal Medicine | Admitting: Internal Medicine

## 2021-10-06 ENCOUNTER — Encounter: Payer: Self-pay | Admitting: Internal Medicine

## 2021-10-06 ENCOUNTER — Emergency Department: Payer: Medicare PPO

## 2021-10-06 ENCOUNTER — Inpatient Hospital Stay: Payer: Medicare PPO

## 2021-10-06 ENCOUNTER — Inpatient Hospital Stay
Admission: EM | Admit: 2021-10-06 | Discharge: 2021-10-10 | DRG: 378 | Disposition: A | Discharge status: Home Health | Payer: Medicare PPO | Attending: Internal Medicine | Admitting: Internal Medicine

## 2021-10-06 DIAGNOSIS — K625 Hemorrhage of anus and rectum: Secondary | ICD-10-CM | POA: Diagnosis present

## 2021-10-06 DIAGNOSIS — Z86010 Personal history of colonic polyps: Secondary | ICD-10-CM

## 2021-10-06 DIAGNOSIS — N1831 Chronic kidney disease, stage 3a: Secondary | ICD-10-CM | POA: Diagnosis present

## 2021-10-06 DIAGNOSIS — I10 Essential (primary) hypertension: Secondary | ICD-10-CM | POA: Diagnosis present

## 2021-10-06 DIAGNOSIS — K5790 Diverticulosis of intestine, part unspecified, without perforation or abscess without bleeding: Secondary | ICD-10-CM

## 2021-10-06 DIAGNOSIS — Z170 Estrogen receptor positive status [ER+]: Secondary | ICD-10-CM

## 2021-10-06 DIAGNOSIS — R109 Unspecified abdominal pain: Secondary | ICD-10-CM

## 2021-10-06 DIAGNOSIS — N183 Chronic kidney disease, stage 3 unspecified: Secondary | ICD-10-CM | POA: Diagnosis present

## 2021-10-06 DIAGNOSIS — K921 Melena: Principal | ICD-10-CM | POA: Diagnosis present

## 2021-10-06 DIAGNOSIS — E039 Hypothyroidism, unspecified: Secondary | ICD-10-CM | POA: Diagnosis present

## 2021-10-06 DIAGNOSIS — R410 Disorientation, unspecified: Secondary | ICD-10-CM | POA: Diagnosis present

## 2021-10-06 DIAGNOSIS — Z7982 Long term (current) use of aspirin: Secondary | ICD-10-CM

## 2021-10-06 DIAGNOSIS — Z20822 Contact with and (suspected) exposure to covid-19: Secondary | ICD-10-CM | POA: Diagnosis present

## 2021-10-06 DIAGNOSIS — Z7902 Long term (current) use of antithrombotics/antiplatelets: Secondary | ICD-10-CM

## 2021-10-06 DIAGNOSIS — Z8673 Personal history of transient ischemic attack (TIA), and cerebral infarction without residual deficits: Secondary | ICD-10-CM

## 2021-10-06 DIAGNOSIS — Z8719 Personal history of other diseases of the digestive system: Secondary | ICD-10-CM | POA: Diagnosis present

## 2021-10-06 DIAGNOSIS — Z79899 Other long term (current) drug therapy: Secondary | ICD-10-CM

## 2021-10-06 DIAGNOSIS — F32A Depression, unspecified: Secondary | ICD-10-CM | POA: Diagnosis present

## 2021-10-06 DIAGNOSIS — C50812 Malignant neoplasm of overlapping sites of left female breast: Secondary | ICD-10-CM | POA: Diagnosis present

## 2021-10-06 DIAGNOSIS — I129 Hypertensive chronic kidney disease with stage 1 through stage 4 chronic kidney disease, or unspecified chronic kidney disease: Secondary | ICD-10-CM | POA: Diagnosis present

## 2021-10-06 DIAGNOSIS — R42 Dizziness and giddiness: Secondary | ICD-10-CM | POA: Diagnosis present

## 2021-10-06 DIAGNOSIS — R4701 Aphasia: Secondary | ICD-10-CM | POA: Diagnosis present

## 2021-10-06 DIAGNOSIS — K922 Gastrointestinal hemorrhage, unspecified: Secondary | ICD-10-CM

## 2021-10-06 DIAGNOSIS — K219 Gastro-esophageal reflux disease without esophagitis: Secondary | ICD-10-CM | POA: Diagnosis present

## 2021-10-06 DIAGNOSIS — E785 Hyperlipidemia, unspecified: Secondary | ICD-10-CM | POA: Diagnosis present

## 2021-10-06 DIAGNOSIS — Z8249 Family history of ischemic heart disease and other diseases of the circulatory system: Secondary | ICD-10-CM

## 2021-10-06 DIAGNOSIS — I672 Cerebral atherosclerosis: Secondary | ICD-10-CM | POA: Diagnosis present

## 2021-10-06 DIAGNOSIS — Z91040 Latex allergy status: Secondary | ICD-10-CM

## 2021-10-06 DIAGNOSIS — R112 Nausea with vomiting, unspecified: Secondary | ICD-10-CM | POA: Diagnosis present

## 2021-10-06 DIAGNOSIS — Z66 Do not resuscitate: Secondary | ICD-10-CM | POA: Diagnosis present

## 2021-10-06 DIAGNOSIS — Z79890 Hormone replacement therapy (postmenopausal): Secondary | ICD-10-CM

## 2021-10-06 DIAGNOSIS — R531 Weakness: Secondary | ICD-10-CM

## 2021-10-06 HISTORY — DX: Gastrointestinal hemorrhage, unspecified: K92.2

## 2021-10-06 LAB — CBC
HCT: 31.2 % — ABNORMAL LOW (ref 36.0–46.0)
Hemoglobin: 10.8 g/dL — ABNORMAL LOW (ref 12.0–15.0)
MCH: 30.1 pg (ref 26.0–34.0)
MCHC: 34.6 g/dL (ref 30.0–36.0)
MCV: 86.9 fL (ref 80.0–100.0)
Platelets: 214 10*3/uL (ref 150–400)
RBC: 3.59 MIL/uL — ABNORMAL LOW (ref 3.87–5.11)
RDW: 14.1 % (ref 11.5–15.5)
WBC: 9.1 10*3/uL (ref 4.0–10.5)
nRBC: 0 % (ref 0.0–0.2)

## 2021-10-06 LAB — HEMOGLOBIN AND HEMATOCRIT, BLOOD
HCT: 30.8 % — ABNORMAL LOW (ref 36.0–46.0)
Hemoglobin: 10.4 g/dL — ABNORMAL LOW (ref 12.0–15.0)

## 2021-10-06 LAB — COMPREHENSIVE METABOLIC PANEL
ALT: 16 U/L (ref 0–44)
AST: 29 U/L (ref 15–41)
Albumin: 4 g/dL (ref 3.5–5.0)
Alkaline Phosphatase: 50 U/L (ref 38–126)
Anion gap: 11 (ref 5–15)
BUN: 9 mg/dL (ref 8–23)
CO2: 24 mmol/L (ref 22–32)
Calcium: 9.2 mg/dL (ref 8.9–10.3)
Chloride: 102 mmol/L (ref 98–111)
Creatinine, Ser: 0.96 mg/dL (ref 0.44–1.00)
GFR, Estimated: >60 mL/min (ref >60)
Glucose, Bld: 71 mg/dL (ref 70–99)
Potassium: 3.3 mmol/L — ABNORMAL LOW (ref 3.5–5.1)
Sodium: 137 mmol/L (ref 135–145)
Total Bilirubin: 0.7 mg/dL (ref 0.3–1.2)
Total Protein: 7.7 g/dL (ref 6.5–8.1)

## 2021-10-06 LAB — PROTIME-INR
INR: 1.3 — ABNORMAL HIGH (ref 0.8–1.2)
Prothrombin Time: 15.7 seconds — ABNORMAL HIGH (ref 11.4–15.2)

## 2021-10-06 LAB — RESP PANEL BY RT-PCR (FLU A&B, COVID) ARPGX2
Influenza A by PCR: NEGATIVE
Influenza B by PCR: NEGATIVE
SARS Coronavirus 2 by RT PCR: NEGATIVE

## 2021-10-06 LAB — TROPONIN I (HIGH SENSITIVITY): Troponin I (High Sensitivity): 16 ng/L (ref <18)

## 2021-10-06 MED ORDER — LACTATED RINGERS IV SOLN: INTRAVENOUS | Status: DC

## 2021-10-06 MED ORDER — ACETAMINOPHEN 325 MG PO TABS: 650.0000 mg | ORAL_TABLET | Freq: Four times a day (QID) | ORAL | Status: DC | PRN

## 2021-10-06 MED ORDER — PANTOPRAZOLE SODIUM 40 MG IV SOLR
40.0000 mg | Freq: Two times a day (BID) | INTRAVENOUS | Status: DC
  Administered 2021-10-06 – 2021-10-09 (×6): 40 mg via INTRAVENOUS
  Filled 2021-10-06 (×6): qty 40

## 2021-10-06 MED ORDER — ONDANSETRON HCL 4 MG/2ML IJ SOLN
4.0000 mg | Freq: Four times a day (QID) | INTRAMUSCULAR | Status: DC | PRN
  Administered 2021-10-06 – 2021-10-09 (×5): 4 mg via INTRAVENOUS
  Filled 2021-10-06 (×6): qty 2

## 2021-10-06 MED ORDER — ACETAMINOPHEN-CODEINE #3 300-30 MG PO TABS
1.0000 | ORAL_TABLET | Freq: Four times a day (QID) | ORAL | Status: DC | PRN
  Administered 2021-10-06 – 2021-10-07 (×2): 1 via ORAL
  Filled 2021-10-06 (×4): qty 1

## 2021-10-06 MED ORDER — LORAZEPAM 1 MG PO TABS
1.0000 mg | ORAL_TABLET | Freq: Once | ORAL | Status: AC
  Administered 2021-10-06: 1 mg via ORAL
  Filled 2021-10-06: qty 1

## 2021-10-06 MED ORDER — HYDROMORPHONE HCL 1 MG/ML IJ SOLN
0.5000 mg | INTRAMUSCULAR | Status: DC | PRN
  Administered 2021-10-07 (×2): 0.5 mg via INTRAVENOUS
  Filled 2021-10-06 (×3): qty 1

## 2021-10-06 MED ORDER — ACETAMINOPHEN 650 MG RE SUPP
650.0000 mg | Freq: Four times a day (QID) | RECTAL | Status: DC | PRN
  Filled 2021-10-06: qty 1

## 2021-10-06 MED ORDER — HYDRALAZINE HCL 20 MG/ML IJ SOLN
10.0000 mg | Freq: Once | INTRAMUSCULAR | Status: AC
  Administered 2021-10-06: 10 mg via INTRAVENOUS
  Filled 2021-10-06: qty 1

## 2021-10-06 MED ORDER — ONDANSETRON HCL 4 MG PO TABS: 4.0000 mg | ORAL_TABLET | Freq: Four times a day (QID) | ORAL | Status: DC | PRN

## 2021-10-06 MED ORDER — IOHEXOL 300 MG/ML  SOLN
100.0000 mL | Freq: Once | INTRAMUSCULAR | Status: AC | PRN
  Administered 2021-10-06: 100 mL via INTRAVENOUS

## 2021-10-06 MED ORDER — LEVOTHYROXINE SODIUM 50 MCG PO TABS
75.0000 ug | ORAL_TABLET | Freq: Every morning | ORAL | Status: DC
  Administered 2021-10-08 – 2021-10-10 (×2): 75 ug via ORAL
  Filled 2021-10-06 (×2): qty 2

## 2021-10-06 NOTE — H&P (Signed)
History and Physical    Cindy Huerta LKG:[id number] DOB: 1946-07-31 DOA: 10/06/2021  PCP: Juluis Pitch, MD    Patient coming from:  Home    Chief Complaint:  Hematochezia.    HPI: Cindy Huerta is Huerta 75 y.o. female seen in ed with complaints of bright red blood per rectum for the past few days and bloody bowel movement at her oncology clinic appointment today for her diagnosis of breast cancer.  During her appointment while in dressing patient started vomiting uncontrollably and had urged for bowel movement patient in the bathroom assisted by 2 RNs was lightheaded and dizzy and had explosive diarrhea with bright red bleeding and stool.  Vitals while on the commode was Huerta blood pressure of 144/81 and heart rate of 55.  Was also vomiting in the emesis bag patient was symptomatic and dizzy and therefore was sent to the emergency room todayPatient had GI evaluation in August 2022 by Dr. Alice Reichert with an EGD and colonoscopy she is currently on Plavix and aspirin .   Pt has past medical history of allergy to latex, lisinopril, procaine, diphenhydramine, clarithromycin, oxycodone.  Patient has past medical history of hypertension, breast cancer, depression, hypothyroidism.  ED Course:  Vitals:   10/06/21 1500 10/06/21 1530 10/06/21 1600 10/06/21 1830  BP: (!) 190/91 (!) 200/71 (!) 178/67 (!) 180/80  Pulse: 69 95 70 91  Resp: (!) 25 (!) 24  20  Temp:    98 F (36.7 C)  TempSrc:    Oral  SpO2: 100% 100% 100% 97%  Weight:      In the emergency room patient is alert awake oriented oxygenating normal, vitals showed hypertension with respirations of high 20s.  In the emergency room patient was given lorazepam. Initial blood work shows normal CMP except potassium of 3.3, CBC shows white count of 9.1 hemoglobin of 10.8 and RDW of 14.1 platelets of 214.  INR of 1.3, Hemoglobin trend: Results for All, Cindy Huerta (MRN [id number]) as of 10/06/2021 18:31  02/02/2021 10:24 02/24/2021 10:20 02/28/2021  10:28 02/28/2021 12:51 04/01/2021 08:30 04/02/2021 19:03 07/09/2021 14:02 07/29/2021 06:30 10/06/2021 15:35  Hemoglobin 11.9 (L) 12.6 12.1 12.3 12.2 12.3 11.8 (L) 11.3 (L) 10.8 (L)     Review of Systems:  Review of Systems  Unable to perform ROS: Age  Gastrointestinal:  Positive for abdominal pain, blood in stool, melena and nausea.  All other systems reviewed and are negative.   Past Medical History:  Diagnosis Date   Breast cancer ([place])    Depression    HLD (hyperlipidemia)    Hypertension    Hypothyroidism     Past Surgical History:  Procedure Laterality Date   BREAST BIOPSY Left 09/23/2021   3:00 13cmfn venus marker, path pending   BREAST BIOPSY Left 09/23/2021   3:00 14 cmfn heart marker, path pending   CHOLECYSTECTOMY     left lumpectomy       reports that she has never smoked. She has never used smokeless tobacco. She reports that she does not drink alcohol and does not use drugs.  Allergies  Allergen Reactions   Latex Rash   Lisinopril Cough   Procaine Other (See Comments)    Unsure - told by DDS not to let anyone give it to her  Confusion    Diphenhydramine Hcl Other (See Comments)    States she was told to not take - jitteriness and agitation Not sure of reaction    Clarithromycin Other (See Comments)  Confusion     Oxycodone Nausea And Vomiting and Anxiety    Family History  Problem Relation Age of Onset   Heart disease Mother    Cancer Paternal Grandmother     Prior to Admission medications   Medication Sig Start Date End Date Taking? Authorizing Provider  acetaminophen (TYLENOL) 500 MG tablet Take 500-1,000 mg by mouth as needed for pain.    [provider]  aspirin 81 MG EC tablet Take 81 mg by mouth daily.    [provider]  atorvastatin (LIPITOR) 80 MG tablet Take 80 mg by mouth daily. 09/21/21   [provider]  clopidogrel (PLAVIX) 75 MG tablet Take 75 mg by mouth daily. 11/23/19   [provider]   Cyanocobalamin (VITAMIN B-12 PO) Take 1 tablet by mouth daily.    [provider]  diltiazem (CARDIZEM CD) 120 MG 24 hr capsule Take by mouth. 04/16/21   [provider]  fexofenadine (ALLEGRA) 180 MG tablet Take 180 mg by mouth as needed for allergies.    [provider]  FLUoxetine (PROZAC) 10 MG capsule Take 10 mg by mouth daily. 06/25/21   [provider]  hydrALAZINE (APRESOLINE) 25 MG tablet Take 25 mg by mouth 3 (three) times daily. 02/04/21   [provider]  levothyroxine (SYNTHROID) 75 MCG tablet Take 75 mcg by mouth every morning. 02/10/21   [provider]  LORazepam (ATIVAN) 0.5 MG tablet Take 0.25-0.5 mg by mouth every 12 (twelve) hours as needed for anxiety. Patient not taking: Reported on 10/06/2021 06/19/21   [provider]  magnesium oxide (MAG-OX) 400 MG tablet Take 400 mg by mouth daily.    [provider]  meclizine (ANTIVERT) 12.5 MG tablet Take 12.5 mg by mouth as needed for dizziness. Patient not taking: Reported on 10/06/2021    [provider]  metoprolol succinate (TOPROL-XL) 50 MG 24 hr tablet Take 50 mg by mouth 2 (two) times daily. 03/18/21   [provider]  Multiple Vitamins-Minerals (THERA-M) TABS Take 1 tablet by mouth daily.    [provider]  ondansetron (ZOFRAN ODT) 4 MG disintegrating tablet Take 1 tablet (4 mg total) by mouth every 8 (eight) hours as needed for nausea or vomiting. Patient not taking: Reported on 10/06/2021 07/09/21   Cindy Crofts, MD  pantoprazole (PROTONIX) 40 MG tablet Take 40 mg by mouth daily.    [provider]  potassium chloride SA (KLOR-CON) 20 MEQ tablet Take 20 mEq by mouth daily.    [provider]  telmisartan (MICARDIS) 80 MG tablet Take 80 mg by mouth daily. 10/06/18   [provider]    Physical Exam: Vitals:   10/06/21 1500 10/06/21 1530 10/06/21 1600 10/06/21 1830  BP: (!) 190/91 (!) 200/71 (!) 178/67 (!)  180/80  Pulse: 69 95 70 91  Resp: (!) 25 (!) 24  20  Temp:    98 F (36.7 C)  TempSrc:    Oral  SpO2: 100% 100% 100% 97%  Weight:       Physical Exam Vitals and nursing note reviewed.  Constitutional:      General: She is not in acute distress.    Appearance: She is not ill-appearing, toxic-appearing or diaphoretic.  HENT:     Head: Normocephalic and atraumatic.     Right Ear: External ear normal.     Left Ear: External ear normal.     Nose: Nose normal.     Mouth/Throat:  Mouth: Mucous membranes are moist.  Eyes:     Extraocular Movements: Extraocular movements intact.  Cardiovascular:     Rate and Rhythm: Normal rate and regular rhythm.     Heart sounds: Murmur heard.    Gallop present.  Pulmonary:     Effort: Pulmonary effort is normal.     Breath sounds: Normal breath sounds. No stridor.  Abdominal:     General: Bowel sounds are normal. There is no distension.     Palpations: Abdomen is soft. There is no mass.     Tenderness: There is no abdominal tenderness. There is no guarding.     Hernia: No hernia is present.  Musculoskeletal:     Right lower leg: No edema.     Left lower leg: No edema.  Skin:    General: Skin is warm and dry.     Capillary Refill: Capillary refill takes 2 to 3 seconds.  Neurological:     General: No focal deficit present.     Mental Status: She is alert and oriented to person, place, and time.  Psychiatric:        Mood and Affect: Mood normal.     Labs on Admission: I have personally reviewed following labs and imaging studies  No results for input(s): CKTOTAL, CKMB, TROPONINI in the last 72 hours. Lab Results  Component Value Date   WBC 9.1 10/06/2021   HGB 10.8 (L) 10/06/2021   HCT 31.2 (L) 10/06/2021   MCV 86.9 10/06/2021   PLT 214 10/06/2021    Recent Labs  Lab 10/06/21 1535  NA 137  K 3.3*  CL 102  CO2 24  BUN 9  CREATININE 0.96  CALCIUM 9.2  PROT 7.7  BILITOT 0.7  ALKPHOS 50  ALT 16  AST 29  GLUCOSE 71    Lab Results  Component Value Date   CHOL 99 07/30/2021   HDL 37 (L) 07/30/2021   LDLCALC 48 07/30/2021   TRIG 71 07/30/2021   No results found for: DDIMER Invalid input(s): POCBNP  Urinalysis    Component Value Date/Time   COLORURINE STRAW (Huerta) 07/29/2021 0743   APPEARANCEUR CLEAR (Huerta) 07/29/2021 0743   LABSPEC 1.004 (L) 07/29/2021 0743   PHURINE 7.0 07/29/2021 0743   GLUCOSEU NEGATIVE 07/29/2021 0743   HGBUR NEGATIVE 07/29/2021 0743   BILIRUBINUR NEGATIVE 07/29/2021 0743   KETONESUR NEGATIVE 07/29/2021 0743   PROTEINUR NEGATIVE 07/29/2021 0743   NITRITE NEGATIVE 07/29/2021 0743   LEUKOCYTESUR NEGATIVE 07/29/2021 0743    COVID-19 Labs No results for input(s): DDIMER, FERRITIN, LDH, CRP in the last 72 hours. Lab Results  Component Value Date   SARSCOV2NAA NEGATIVE 10/06/2021   [place] NEGATIVE 07/29/2021    Radiological Exams on Admission: CT Head Wo Contrast  Result Date: 10/06/2021 CLINICAL DATA:  Huerta 75 year old female with new diagnosis of breast cancer is presents with sudden onset of vomiting and disorientation. EXAM: CT HEAD WITHOUT CONTRAST TECHNIQUE: Contiguous axial images were obtained from the base of the skull through the vertex without intravenous contrast. COMPARISON:  July 29, 2021, CT and MRI of the brain. FINDINGS: Brain: No evidence of acute infarction, hemorrhage, hydrocephalus, extra-axial collection or mass lesion/mass effect. Signs of atrophy and chronic microvascular ischemic change. Findings unchanged compared to previous imaging Vascular: No hyperdense vessel or unexpected calcification. Skull: Normal. Negative for fracture or focal lesion. Sinuses/Orbits: Visualized paranasal sinuses and orbits are unremarkable. Other: None IMPRESSION: No acute intracranial abnormality. Signs of atrophy and chronic microvascular ischemic change. Electronically Signed  By: Zetta Bills M.D.   On: 10/06/2021 13:14   CT ABDOMEN PELVIS W CONTRAST  Result Date:  10/06/2021 CLINICAL DATA:  Lower GI bleed, abdominal pain, history hypertension, newly diagnosed breast cancer EXAM: CT ABDOMEN AND PELVIS WITH CONTRAST TECHNIQUE: Multidetector CT imaging of the abdomen and pelvis was performed using the standard protocol following bolus administration of intravenous contrast. CONTRAST:  16mL OMNIPAQUE IOHEXOL 300 MG/ML  SOLN COMPARISON:  04/01/2021 FINDINGS: Lower chest: Lung bases clear Hepatobiliary: Gallbladder surgically absent. Cyst lateral segment LEFT lobe liver 7 mm diameter previously 6 mm. Remainder of liver normal appearance. Pancreas: Normal appearance Spleen: Normal appearance Adrenals/Urinary Tract: Adrenal glands, kidneys, ureters, and bladder normal appearance Stomach/Bowel: Appendix not identified. Ingested radiopacity within cecum. Scattered colonic diverticulosis without evidence of diverticulitis. Stomach and remaining bowel loops unremarkable. Vascular/Lymphatic: Atherosclerotic calcifications aorta and iliac arteries without aneurysm. No adenopathy. Reproductive: Uterus surgically absent with nonvisualization of ovaries Other: No free air or free fluid. No hernia or inflammatory process. Musculoskeletal: Diffuse osseous demineralization. IMPRESSION: Colonic diverticulosis without evidence of diverticulitis. No acute intra-abdominal or intrapelvic abnormalities. Aortic Atherosclerosis (ICD10-I70.0). Electronically Signed   By: Lavonia Dana M.D.   On: 10/06/2021 18:06    EKG: Independently reviewed.  SR 67.   Echocardiogram 07/2021: IMPRESSIONS   1. Left ventricular ejection fraction, by estimation, is 55 to 60%. The  left ventricle has normal function. The left ventricle has no regional  wall motion abnormalities. Left ventricular diastolic parameters are  consistent with Grade II diastolic  dysfunction (pseudonormalization).   2. Right ventricular systolic function is normal. The right ventricular  size is normal.   3. The mitral valve is normal  in structure. Trivial mitral valve  regurgitation.   4. The tricuspid valve is abnormal.   5. The aortic valve is normal in structure. Aortic valve regurgitation is  not visualized.    Assessment/Plan Principal Problem:   Rectal bleeding Active Problems:   Nausea & vomiting   Dizziness   Hypothyroidism   CKD (chronic kidney disease), stage IIIa   Benign essential hypertension   Carcinoma of overlapping sites of left breast in female, estrogen receptor positive (HCC)   Rectal bleeding/nausea vomiting/dizziness:  Suspect PUD/lower GI bleed. GI consulted per ED provider for symptomatic patient with active bleeding. Will start patient on IV PPI therapy, as needed antiemetics as needed transfusion H&H every 6 hours.  Anemia panel, Gastroccult-for upper GI eval. N.p.o., hold all p.o. medications, blood pressure medication changed to IV scheduled hydralazine. Aspiration precaution.,  Fall precautions, orthostatic vital signs stable.   Hypothyroidism: Continue home regimen of levothyroxine p.o.   Chronic kidney disease stage IIIa: Lab Results  Component Value Date   CREATININE 0.96 10/06/2021   CREATININE 1.12 (H) 07/29/2021   CREATININE 1.02 (H) 07/09/2021  Improvement in creatinine to normal. Will avoid contrast studies unless absolutely necessary. Follow BMP and creatinine levels.  Hypertension: Blood pressure (!) 180/80, pulse 91, temperature 98 F (36.7 C), temperature source Oral, resp. rate 20, weight 78 kg, SpO2 97 %. Blood pressure is elevated, patient's home medication regimen has been held. Patient started on scheduled hydralazine every 6 hourly 10 mg of IV.  Left breast cancer: Patient has ER positive left breast cancer followed by oncology. Inpatient consult as deemed appropriate.   DVT prophylaxis:  SCD   Code Status:  Full Code    Family Communication:  Ivorie, [place] (Son)  [phone] (Mobile)   Disposition Plan:  Home     Consults called:  Gi-  Dr. Bonna Gains.   Admission status: Inpatient.     Para Skeans MD [facility] [phone] How to contact the [facility] Attending or Consulting provider [place] or covering provider during after hours [place], for this patient.    Check the care team in [facility] and look for Huerta) attending/consulting [facility] provider listed and b) the [facility] team listed Log into [email or web address] and use [place]'s universal password to access. If you do not have the password, please contact the hospital operator. Locate the [facility] provider you are looking for under [facility] and page to Huerta number that you can be directly reached. If you still have difficulty reaching the provider, please page the [facility] (Director on Call) for the Hospitalists listed on amion for assistance. [email or web address] Password [facility] 10/06/2021, 6:52 PM

## 2021-10-06 NOTE — ED Triage Notes (Signed)
Patient to ER from [facility]. Patient was being seen for new diagnosis of breast CA. Began having sudden onset of vomiting and urge to have BM. When taken to bathroom, had bright red rectal bleeding. Initial BP was 158/68, 144/81 while in restroom. HR initial was 80, 55 in restroom. Had new disorientation on way to ER.

## 2021-10-06 NOTE — ED Notes (Signed)
MD. Kerman Passey made aware of pt's BP and order of ativan PO given. Pt is very anxious.

## 2021-10-06 NOTE — ED Provider Notes (Signed)
[facility] Emergency Department Provider Note  Time seen: 1:24 PM  I have reviewed the triage vital signs and the nursing notes.   HISTORY  Chief Complaint Abdominal Pain and Altered Mental Status   HPI Cindy Huerta is a 75 y.o. female with a past medical history of hypertension, hyperlipidemia, depression, expressive aphasia, presents to the emergency department for lower GI bleed.  Patient is coming from the cancer center where she was being seen for newly diagnosed breast cancer.  Patient began having vomiting and had an urge to have a bowel movement.  When she had a bowel movement she noted gross hematochezia.  Denies bloody vomitus.  Patient is complaining of some abdominal discomfort fairly diffusely.  Denies any focal abdominal pain.  In route to the hospital from the cancer center patient became somewhat disoriented however in speaking to the patient she appears well on my exam I have seen the patient previously she has known expressive aphasia which is likely with a noticed in route to the hospital.  No weakness or numbness.   Past Medical History:  Diagnosis Date   Breast cancer ([place])    Depression    HLD (hyperlipidemia)    Hypertension    Hypothyroidism     Patient Active Problem List   Diagnosis Date Noted   Carcinoma of overlapping sites of left breast in female, estrogen receptor positive ([place]) 10/06/2021   Left-sided weakness 07/29/2021   HLD (hyperlipidemia) 07/29/2021   Hypothyroidism 07/29/2021   Left sided numbness 07/29/2021   Hypertension    Depression    CKD (chronic kidney disease), stage IIIa    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   Epigastric pain 04/23/2021   GERD (gastroesophageal reflux disease) 04/23/2021   Benign essential hypertension 01/20/2021   History of breast cancer 01/20/2021   History of TIA (transient ischemic attack) 01/20/2021   Palpitations 01/20/2021   Malignant neoplasm of upper-outer quadrant of left female  breast ([place]) 12/06/2018    Past Surgical History:  Procedure Laterality Date   BREAST BIOPSY Left 09/23/2021   3:00 13cmfn venus marker, path pending   BREAST BIOPSY Left 09/23/2021   3:00 14 cmfn heart marker, path pending   CHOLECYSTECTOMY     left lumpectomy      Prior to Admission medications   Medication Sig Start Date End Date Taking? Authorizing Provider  acetaminophen (TYLENOL) 500 MG tablet Take 500-1,000 mg by mouth as needed for pain.    [provider]  aspirin 81 MG EC tablet Take 81 mg by mouth daily.    [provider]  atorvastatin (LIPITOR) 80 MG tablet Take 80 mg by mouth daily. 09/21/21   [provider]  clopidogrel (PLAVIX) 75 MG tablet Take 75 mg by mouth daily. 11/23/19   [provider]  Cyanocobalamin (VITAMIN B-12 PO) Take 1 tablet by mouth daily.    [provider]  diltiazem (CARDIZEM CD) 120 MG 24 hr capsule Take by mouth. 04/16/21   [provider]  fexofenadine (ALLEGRA) 180 MG tablet Take 180 mg by mouth as needed for allergies.    [provider]  FLUoxetine (PROZAC) 10 MG capsule Take 10 mg by mouth daily. 06/25/21   [provider]  hydrALAZINE (APRESOLINE) 25 MG tablet Take 25 mg by mouth 3 (three) times daily. 02/04/21   [provider]  levothyroxine (SYNTHROID) 75 MCG tablet Take 75 mcg by mouth every morning. 02/10/21   [provider]  LORazepam (ATIVAN) 0.5 MG tablet  Take 0.25-0.5 mg by mouth every 12 (twelve) hours as needed for anxiety. Patient not taking: Reported on 10/06/2021 06/19/21   [provider]  magnesium oxide (MAG-OX) 400 MG tablet Take 400 mg by mouth daily.    [provider]  meclizine (ANTIVERT) 12.5 MG tablet Take 12.5 mg by mouth as needed for dizziness. Patient not taking: Reported on 10/06/2021    [provider]  metoprolol succinate (TOPROL-XL) 50 MG 24 hr tablet Take 50 mg by mouth 2 (two) times daily. 03/18/21    [provider]  Multiple Vitamins-Minerals (THERA-M) TABS Take 1 tablet by mouth daily.    [provider]  ondansetron (ZOFRAN ODT) 4 MG disintegrating tablet Take 1 tablet (4 mg total) by mouth every 8 (eight) hours as needed for nausea or vomiting. Patient not taking: Reported on 10/06/2021 07/09/21   Vladimir Crofts, MD  pantoprazole (PROTONIX) 40 MG tablet Take 40 mg by mouth daily.    [provider]  potassium chloride SA (KLOR-CON) 20 MEQ tablet Take 20 mEq by mouth daily.    [provider]  telmisartan (MICARDIS) 80 MG tablet Take 80 mg by mouth daily. 10/06/18   [provider]    Allergies  Allergen Reactions   Latex Rash   Lisinopril Cough   Procaine Other (See Comments)    Unsure - told by DDS not to let anyone give it to her  Confusion    Diphenhydramine Hcl Other (See Comments)    States she was told to not take - jitteriness and agitation Not sure of reaction    Clarithromycin Other (See Comments)    Confusion     Oxycodone Nausea And Vomiting and Anxiety    Family History  Problem Relation Age of Onset   Heart disease Mother    Cancer Paternal Grandmother     Social History Social History   Tobacco Use   Smoking status: Never   Smokeless tobacco: Never  Substance Use Topics   Alcohol use: Never   Drug use: Never    Review of Systems Constitutional: Negative for fever. Cardiovascular: Negative for chest pain. Respiratory: Negative for shortness of breath. Gastrointestinal: Mild diffuse abdominal pain.  Positive for nausea vomiting x1.  Positive for bright red blood per rectum. Genitourinary: Negative for urinary compaints Musculoskeletal: Negative for musculoskeletal complaints Neurological: Negative for headache All other ROS negative  ____________________________________________   PHYSICAL EXAM:  VITAL SIGNS: ED Triage Vitals  Enc Vitals Group     BP 10/06/21 1239 (!) 164/77     Pulse Rate  10/06/21 1239 75     Resp 10/06/21 1239 20     Temp 10/06/21 1239 98 F (36.7 C)     Temp Source 10/06/21 1239 Oral     SpO2 10/06/21 1239 99 %     Weight 10/06/21 1240 172 lb (78 kg)     Height --      Head Circumference --      Peak Flow --      Pain Score 10/06/21 1239 0     Pain Loc --      Pain Edu? --      Excl. in [place]? --    Constitutional: Patient is awake alert, no acute distress.  Patient does have expressive aphasia with some difficulty speaking. Eyes: Normal exam ENT      Head: Normocephalic and atraumatic.      Mouth/Throat: Mucous membranes are moist. Cardiovascular: Normal rate, regular rhythm.  Respiratory: Normal  respiratory effort without tachypnea nor retractions. Breath sounds are clear  Gastrointestinal: Soft, obese, mild diffuse tenderness without focal tenderness identified.  No rebound guarding or distention. Musculoskeletal: Nontender with normal range of motion in all extremities. Neurologic:  Normal speech and language. No gross focal neurologic deficits Skin:  Skin is warm, dry and intact.  Psychiatric: Mood and affect are normal.      RADIOLOGY  CT scan head shows no acute abnormality.  ____________________________________________   INITIAL IMPRESSION / ASSESSMENT AND PLAN / ED COURSE  Pertinent labs & imaging results that were available during my care of the patient were reviewed by me and considered in my medical decision making (see chart for details).   Patient presents emergency department for bright red blood per rectum.  There was some concern for confusion/speech like symptoms however patient has expressive aphasia at baseline.  I have had the patient previously and she appears to be at baseline.  CT scan of the head shows no acute abnormality.  We will obtain a CT scan of the abdomen given the patient's abdominal tenderness.  We will check labs, type and screen and continue to closely monitor.  Patient appears to be on Plavix and aspirin  as her only anticoagulation.  Labs and CT are pending.  Patient will require admission once her work-up is completed.  Iyania Denne Friese was evaluated in Emergency Department on 10/06/2021 for the symptoms described in the history of present illness. She was evaluated in the context of the global COVID-19 pandemic, which necessitated consideration that the patient might be at risk for infection with the SARS-CoV-2 virus that causes COVID-19. Institutional protocols and algorithms that pertain to the evaluation of patients at risk for COVID-19 are in a state of rapid change based on information released by regulatory bodies including the CDC and federal and state organizations. These policies and algorithms were followed during the patient's care in the ED.  ____________________________________________   FINAL CLINICAL IMPRESSION(S) / ED DIAGNOSES  GI bleed   Harvest Dark, MD 10/06/21 1540

## 2021-10-06 NOTE — Assessment & Plan Note (Addendum)
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 no obvious clinical concerns for metastatic disease at this time; no imaging done.  #Discussed that I would recommend mastectomy-which would most likely have her avoid radiation.  The other option being lumpectomy along with radiation.  Given multifocal disease-I would defer to surgery if lumpectomy is technically an option.  Will discuss with Dr. Lysle Pearl.  Given patient's son query regarding timeline of treatment options [given the upcoming holidays]-however I would recommend proceeding with surgery at the earliest.  #Given patient's comorbidities/frailty/mucinous tumor-would not recommend chemotherapy.  I discussed the role of antihormone therapy for at least 5 to 10 years.  #Bright red blood per rectum/nausea/abdominal discomfort-s/p GI evaluation August 2022 [EGD/[place];Dr.Toledo]. on plavix.  Recommend further evaluation in the emergency room today.  #History of TIA-on Plavix.  #Dysarthria-?  Anxiety as per family/nausea-?  Anxiety versus organic causes.  Await further evaluation in the emergency room.  Thank you Dr. Lovie Macadamia for allowing me to participate in the care of your pleasant patient. Please do not hesitate to contact me with questions or concerns in the interim.   # DISPOSITION: #Emergency room #Follow-up to be decided

## 2021-10-06 NOTE — Progress Notes (Signed)
Patient reports some diarrhea (son states that he thinks this is from her nerves). She states that her appetite is "so-so" Patient saw Dr Lysle Pearl this morning.

## 2021-10-06 NOTE — Progress Notes (Signed)
[facility] NOTE  Patient Care Team: Cindy Pitch, MD as PCP - General (Family Medicine) Cindy Demark, RN as Oncology Nurse Navigator  CHIEF COMPLAINTS/PURPOSE OF CONSULTATION: Breast cancer  #  Oncology History Overview Note  # LEFT BREAST CANCER [FEB 2019; [facility]]- s/p lumpectomy; NO RT; No chemotherapy. Pill x 6 months.   #   Ultrasound of the left axilla demonstrated normal appearing left axillary lymph nodes   IMPRESSION: 1. Interval multiple masses in the posterior outer left breast at the location of previous surgery, as described above. These could all represent areas of fat necrosis and oil cyst formation. However, 2 of these are indeterminate including a 2.6 cm cystic and solid mass in 3 o'clock position with ultrasound findings suspicious for the possibility of a papillary lesion and a 1.1 cm adjacent bilobed mass more laterally which could be solid. 2. No evidence of malignancy elsewhere in either breast and no left axillary adenopathy.   RECOMMENDATION: Ultrasound-guided core needle biopsies of the 2.6 cm and 1.1 cm masses in the 3 o'clock position of the left breast. This has been discussed with the patient and her son. She will be assisted in getting these scheduled.   I have discussed the findings and recommendations with the patient. If applicable, a reminder letter will be sent to the patient regarding the next appointment.   BI-RADS CATEGORY  4: Suspicious.  DIAGNOSIS:  A. BREAST, LEFT, 3 O'CLOCK, 14 CM FROM NIPPLE (HEART CLIP);  ULTRASOUND-GUIDED CORE BIOPSY:  - INVASIVE MAMMARY CARCINOMA WITH MUCINOUS FEATURES.  Size of invasive carcinoma:  8 mm in this sample  Histologic grade of invasive carcinoma: Grade 1                       Glandular/tubular differentiation score: 2                       Nuclear pleomorphism score: 2                       Mitotic rate score: 1                       Total score: 5  Ductal  carcinoma in situ: Not identified  Lymphovascular invasion: Not identified   B. BREAST, LEFT, 3 O'CLOCK, 13 CM FROM NIPPLE (VENUS CLIP);  ULTRASOUND-GUIDED CORE BIOPSY:  - INVASIVE MAMMARY CARCINOMA WITH MUCINOUS FEATURES.  Size of invasive carcinoma:  9 mm in this sample  Histologic grade of invasive carcinoma: Grade 1                       Glandular/tubular differentiation score: 2                       Nuclear pleomorphism score: 2                       Mitotic rate score: 1                       Total score: 5  Ductal carcinoma in situ: Not identified  Lymphovascular invasion: Not identified   Comment:  The tumor has similar appearances in A and B, with more cystic features  in the B sample.  The histologic grade and mucinous features match the previously excised  left breast multifocal carcinoma,  based on review of the pathology  reports from 2020:  [facility] cases [phone] (core biopsy), [phone] (initial  excision), [phone] (re-excision), and [phone] (second  re-excision).   ER/PR/HER2: Immunohistochemistry will be performed on block A1, with  reflex to [place] for HER2 2+. The results will be reported in an addendum.   GROSS DESCRIPTION:  A. Labeled: Left breast 3:00 14 cm from the nipple  Received: Formalin  Time/date in fixative: Collected and placed in formalin at 8:40 AM on  09/23/2021  Cold ischemic time: Less than 1 minute  Total fixation time: Approximately 8.6 hours  Core pieces: 4  Size: Ranges from 1.0-1.4 cm in length and 0.1 cm in diameter  Description: Tan-yellow fibrofatty cores of tissue  Ink color: Blue  Entirely submitted in 2 cassettes with 2 cores in each.   B. Labeled: Left breast 3:00 13 cm from the nipple  Received: Formalin  Time/date in fixative: Collected and placed in formalin at 8:44 AM on  09/23/2021  Cold ischemic time: Less than 1 minute  Total fixation time: Approximately 8.5 hours  Core pieces: 4  Size: Ranges  from 0.5-1.2 cm in length and 0.1 cm in diameter  Description: Received are tan cores of soft tissue with additional  smaller fragments of tissue, 0.8 x 0.2 x 0.1 cm in aggregate.  Ink color: Black  Entirely submitted in 3 cassettes with 3 cores in B1, 1 core and  additional fragments in B2, and remaining fragments in B3.   [facility] 09/23/2021   Final Diagnosis performed by Cindy Lemma, MD.   Electronically signed  09/24/2021 2:16:34PM  The electronic signature indicates that the named Attending Pathologist  has evaluated the specimen  Technical component performed at [facility], [address], [place],  [place] [address] Lab: [phone] Dir: Cindy Farmer, MD, MMM   Professional component performed at [facility], [facility], [place], [place], [place] [address] Lab: [phone]  Dir: Cindy Simpers, MD   ADDENDUM:  CASE SUMMARY: BREAST BIOMARKER TESTS  Estrogen Receptor (ER) Status: POSITIVE          Percentage of cells with nuclear positivity: Greater than 90%          Average intensity of staining: Strong   Progesterone Receptor (PgR) Status: POSITIVE          Percentage of cells with nuclear positivity: 1-10%          Average intensity of staining: Weak   HER2 (by immunohistochemistry): NEGATIVE (Score 1+)  Ki-67: Not performed         Carcinoma of overlapping sites of left breast in female, estrogen receptor positive ([place])  10/06/2021 Initial Diagnosis   Carcinoma of overlapping sites of left breast in female, estrogen receptor positive ([place])      HISTORY OF PRESENTING ILLNESS: Patient is in a wheelchair.  Accompanied by her son, Cindy Huerta.  [name] the phone. Cindy Huerta 75 y.o.  female with  prior history of left breast cancer/stage I mucinous; and multiple other comorbidities including-TIA on Plavix/anxiety has been referred to [place] for further evaluation recommendations for  recurrent diagnosis of breast cancer.  Patient noted to have a lump in  the left breast-for the last 6 months.  This led to diagnostic mammogram/ultrasound/followed by biopsy-as summarized above.  Patient complains of fatigue.  Complains of abdominal discomfort.  Positive for nausea.  No vomiting.  Also complains of bright red blood per rectum in the last few days.  Patient had a bowel  movement in the clinic that had copious amount of bright red blood.  Review of Systems  Constitutional:  Positive for malaise/fatigue. Negative for chills, diaphoresis, fever and weight loss.  HENT:  Negative for nosebleeds and sore throat.   Eyes:  Negative for double vision.  Respiratory:  Negative for cough, hemoptysis, sputum production, shortness of breath and wheezing.   Cardiovascular:  Negative for chest pain, palpitations, orthopnea and leg swelling.  Gastrointestinal:  Positive for abdominal pain, blood in stool, heartburn and nausea. Negative for constipation, diarrhea, melena and vomiting.  Genitourinary:  Negative for dysuria, frequency and urgency.  Musculoskeletal:  Negative for back pain and joint pain.  Skin: Negative.  Negative for itching and rash.  Neurological:  Positive for dizziness and weakness. Negative for tingling, focal weakness and headaches.  Endo/Heme/Allergies:  Does not bruise/bleed easily.  Psychiatric/Behavioral:  Negative for depression. The patient is not nervous/anxious and does not have insomnia.     MEDICAL HISTORY:  Past Medical History:  Diagnosis Date   Breast cancer ([place])    Depression    HLD (hyperlipidemia)    Hypertension    Hypothyroidism     SURGICAL HISTORY: Past Surgical History:  Procedure Laterality Date   BREAST BIOPSY Left 09/23/2021   3:00 13cmfn venus marker, path pending   BREAST BIOPSY Left 09/23/2021   3:00 14 cmfn heart marker, path pending   CHOLECYSTECTOMY     left lumpectomy      SOCIAL HISTORY: Social History   Socioeconomic History   Marital status: Widowed    Spouse name: Not on file   Number  of children: Not on file   Years of education: Not on file   Highest education level: Not on file  Occupational History   Not on file  Tobacco Use   Smoking status: Never   Smokeless tobacco: Never  Substance and Sexual Activity   Alcohol use: Never   Drug use: Never   Sexual activity: Not on file  Other Topics Concern   Not on file  Social History Narrative   Currently lives in with [place] in [place]; out side uses cane/ to keep steady [hx of ministrokes-Jan 2022]; never smoked; no alcohol. Tailoring/sewing- used own buisness    Social Determinants of Radio broadcast assistant Strain: Not on file  Food Insecurity: Not on file  Transportation Needs: Not on file  Physical Activity: Not on file  Stress: Not on file  Social Connections: Not on file  Intimate Partner Violence: Not on file    FAMILY HISTORY: Family History  Problem Relation Age of Onset   Heart disease Mother    Cancer Paternal Grandmother     ALLERGIES:  is allergic to latex, lisinopril, procaine, diphenhydramine hcl, clarithromycin, and oxycodone.  MEDICATIONS:  Current Outpatient Medications  Medication Sig Dispense Refill   acetaminophen (TYLENOL) 500 MG tablet Take 500-1,000 mg by mouth as needed for pain.     aspirin 81 MG EC tablet Take 81 mg by mouth daily.     atorvastatin (LIPITOR) 80 MG tablet Take 80 mg by mouth daily.     clopidogrel (PLAVIX) 75 MG tablet Take 75 mg by mouth daily.     Cyanocobalamin (VITAMIN B-12 PO) Take 1 tablet by mouth daily.     diltiazem (CARDIZEM CD) 120 MG 24 hr capsule Take by mouth.     fexofenadine (ALLEGRA) 180 MG tablet Take 180 mg by mouth as needed for allergies.     FLUoxetine (PROZAC) 10 MG  capsule Take 10 mg by mouth daily.     hydrALAZINE (APRESOLINE) 25 MG tablet Take 25 mg by mouth 3 (three) times daily.     levothyroxine (SYNTHROID) 75 MCG tablet Take 75 mcg by mouth every morning.     magnesium oxide (MAG-OX) 400 MG tablet Take 400 mg by mouth daily.      metoprolol succinate (TOPROL-XL) 50 MG 24 hr tablet Take 50 mg by mouth 2 (two) times daily.     Multiple Vitamins-Minerals (THERA-M) TABS Take 1 tablet by mouth daily.     pantoprazole (PROTONIX) 40 MG tablet Take 40 mg by mouth daily.     potassium chloride SA (KLOR-CON) 20 MEQ tablet Take 20 mEq by mouth daily.     telmisartan (MICARDIS) 80 MG tablet Take 80 mg by mouth daily.     LORazepam (ATIVAN) 0.5 MG tablet Take 0.25-0.5 mg by mouth every 12 (twelve) hours as needed for anxiety. (Patient not taking: Reported on 10/06/2021)     meclizine (ANTIVERT) 12.5 MG tablet Take 12.5 mg by mouth as needed for dizziness. (Patient not taking: Reported on 10/06/2021)     ondansetron (ZOFRAN ODT) 4 MG disintegrating tablet Take 1 tablet (4 mg total) by mouth every 8 (eight) hours as needed for nausea or vomiting. (Patient not taking: Reported on 10/06/2021) 20 tablet 0   No current facility-administered medications for this visit.      Marland Kitchen  PHYSICAL EXAMINATION: ECOG PERFORMANCE STATUS: 1 - Symptomatic but completely ambulatory  Vitals:   10/06/21 1127  BP: (!) 158/68  Pulse: 80  Resp: 18  Temp: 97.9 F (36.6 C)  SpO2: 100%   Filed Weights   10/06/21 1120  Weight: 172 lb (78 kg)    Physical Exam Vitals and nursing note reviewed.  HENT:     Head: Normocephalic and atraumatic.     Mouth/Throat:     Pharynx: Oropharynx is clear.  Eyes:     Extraocular Movements: Extraocular movements intact.     Pupils: Pupils are equal, round, and reactive to light.  Cardiovascular:     Rate and Rhythm: Normal rate and regular rhythm.  Pulmonary:     Comments: Decreased breath sounds bilaterally.  Abdominal:     Palpations: Abdomen is soft.  Musculoskeletal:        General: Normal range of motion.     Cervical back: Normal range of motion.  Skin:    General: Skin is warm.  Neurological:     General: No focal deficit present.     Mental Status: She is alert and oriented to person, place,  and time.  Psychiatric:        Behavior: Behavior normal.        Judgment: Judgment normal.     LABORATORY DATA:  I have reviewed the data as listed Lab Results  Component Value Date   WBC 8.3 07/29/2021   HGB 11.3 (L) 07/29/2021   HCT 33.0 (L) 07/29/2021   MCV 88.2 07/29/2021   PLT 211 07/29/2021   Recent Labs    04/01/21 0830 04/02/21 1903 07/09/21 1402 07/29/21 0630  NA 135 134* 137 137  K 3.6 3.2* 3.8 3.6  CL 100 101 102 101  CO2 $Re'24 22 25 26  'hSU$ GLUCOSE 92 92 82 96  BUN $Re'11 10 13 16  'Bpi$ CREATININE 1.00 1.04* 1.02* 1.12*  CALCIUM 9.2 9.5 9.4 9.6  GFRNONAA 58* 55* 57* 51*  PROT 7.5 7.8  --  7.4  ALBUMIN 4.1 4.1  --  3.8  AST 26 24  --  25  ALT 16 16  --  14  ALKPHOS 52 57  --  52  BILITOT 0.6 0.4  --  0.7    RADIOGRAPHIC STUDIES: I have personally reviewed the radiological images as listed and agreed with the findings in the report. US BREAST LTD UNI LEFT INC AXILLA  Result Date: 09/11/2021 CLINICAL DATA:  Mass felt by the patient in the outer left breast for the past 3 months at the site of a previous excisional biopsy performed 2019. EXAM: DIGITAL DIAGNOSTIC BILATERAL MAMMOGRAM WITH TOMOSYNTHESIS AND CAD; ULTRASOUND LEFT BREAST LIMITED TECHNIQUE: Bilateral digital diagnostic mammography and breast tomosynthesis was performed. The images were evaluated with computer-aided detection.; Targeted ultrasound examination of the left breast was performed. COMPARISON:  Previous exam(s). ACR Breast Density Category b: There are scattered areas of fibroglandular density. FINDINGS: Since the patient's most recent examination dated 10/11/2019 at [facility], there has been interval development of multiple oval, rounded and lobulated masses in the posterior outer left breast at the location of multiple surgical clips. Some of these have relatively low density suggesting oil cysts. Others are higher in density. The largest measures 3 cm in maximum diameter. This is at the location of the  palpable mass, marked with a metallic marker. The masses span an area measuring 7.9 x 5.3 x 2.8 cm. No abnormal appearing left axillary lymph nodes are visualized. The right breast continues to have a normal appearance. On physical exam, the patient has an approximately 2.5 cm oval, mildly protuberant palpable mass in the 3 o'clock position of the left breast, 13 cm from the nipple. Targeted ultrasound is performed, showing a 2.6 x 1.8 x 1.3 cm oval, horizontally oriented, circumscribed mass in the 3 o'clock position of the left breast, 13 cm from the nipple. This has cystic and solid appearing components. The solid appearing component has an irregular mass-like appearance with internal blood flow with power Doppler. This component measures 1.5 x 1.2 x 0.7 cm in maximum dimensions. There is an adjacent, bilobed, medium echotexture mass slightly more laterally. This mass measures 1.1 x 1.1 x 0.6 cm. No internal blood flow was seen with power Doppler. Multiple additional smaller, rounded, predominantly cystic masses are demonstrated, some with mild diffuse wall thickening. Ultrasound of the left axilla demonstrated normal appearing left axillary lymph nodes IMPRESSION: 1. Interval multiple masses in the posterior outer left breast at the location of previous surgery, as described above. These could all represent areas of fat necrosis and oil cyst formation. However, 2 of these are indeterminate including a 2.6 cm cystic and solid mass in 3 o'clock position with ultrasound findings suspicious for the possibility of a papillary lesion and a 1.1 cm adjacent bilobed mass more laterally which could be solid. 2. No evidence of malignancy elsewhere in either breast and no left axillary adenopathy. RECOMMENDATION: Ultrasound-guided core needle biopsies of the 2.6 cm and 1.1 cm masses in the 3 o'clock position of the left breast. This has been discussed with the patient and her son. She will be assisted in getting these  scheduled. I have discussed the findings and recommendations with the patient. If applicable, a reminder letter will be sent to the patient regarding the next appointment. BI-RADS CATEGORY  4: Suspicious. Electronically Signed   By: Claudie Revering M.D.   On: 09/11/2021 12:39  MM DIAG BREAST TOMO BILATERAL  Result Date: 09/11/2021 CLINICAL DATA:  Mass felt by the patient in the outer left  breast for the past 3 months at the site of a previous excisional biopsy performed 2019. EXAM: DIGITAL DIAGNOSTIC BILATERAL MAMMOGRAM WITH TOMOSYNTHESIS AND CAD; ULTRASOUND LEFT BREAST LIMITED TECHNIQUE: Bilateral digital diagnostic mammography and breast tomosynthesis was performed. The images were evaluated with computer-aided detection.; Targeted ultrasound examination of the left breast was performed. COMPARISON:  Previous exam(s). ACR Breast Density Category b: There are scattered areas of fibroglandular density. FINDINGS: Since the patient's most recent examination dated 10/11/2019 at [place], there has been interval development of multiple oval, rounded and lobulated masses in the posterior outer left breast at the location of multiple surgical clips. Some of these have relatively low density suggesting oil cysts. Others are higher in density. The largest measures 3 cm in maximum diameter. This is at the location of the palpable mass, marked with a metallic marker. The masses span an area measuring 7.9 x 5.3 x 2.8 cm. No abnormal appearing left axillary lymph nodes are visualized. The right breast continues to have a normal appearance. On physical exam, the patient has an approximately 2.5 cm oval, mildly protuberant palpable mass in the 3 o'clock position of the left breast, 13 cm from the nipple. Targeted ultrasound is performed, showing a 2.6 x 1.8 x 1.3 cm oval, horizontally oriented, circumscribed mass in the 3 o'clock position of the left breast, 13 cm from the nipple. This has cystic and solid appearing  components. The solid appearing component has an irregular mass-like appearance with internal blood flow with power Doppler. This component measures 1.5 x 1.2 x 0.7 cm in maximum dimensions. There is an adjacent, bilobed, medium echotexture mass slightly more laterally. This mass measures 1.1 x 1.1 x 0.6 cm. No internal blood flow was seen with power Doppler. Multiple additional smaller, rounded, predominantly cystic masses are demonstrated, some with mild diffuse wall thickening. Ultrasound of the left axilla demonstrated normal appearing left axillary lymph nodes IMPRESSION: 1. Interval multiple masses in the posterior outer left breast at the location of previous surgery, as described above. These could all represent areas of fat necrosis and oil cyst formation. However, 2 of these are indeterminate including a 2.6 cm cystic and solid mass in 3 o'clock position with ultrasound findings suspicious for the possibility of a papillary lesion and a 1.1 cm adjacent bilobed mass more laterally which could be solid. 2. No evidence of malignancy elsewhere in either breast and no left axillary adenopathy. RECOMMENDATION: Ultrasound-guided core needle biopsies of the 2.6 cm and 1.1 cm masses in the 3 o'clock position of the left breast. This has been discussed with the patient and her son. She will be assisted in getting these scheduled. I have discussed the findings and recommendations with the patient. If applicable, a reminder letter will be sent to the patient regarding the next appointment. BI-RADS CATEGORY  4: Suspicious. Electronically Signed   By: Claudie Revering M.D.   On: 09/11/2021 12:39  MM CLIP PLACEMENT LEFT  Result Date: 09/23/2021 CLINICAL DATA:  Status post 2 site ultrasound-guided biopsy EXAM: 3D DIAGNOSTIC LEFT MAMMOGRAM POST ULTRASOUND BIOPSY COMPARISON:  Previous exam(s). FINDINGS: Site 1: 3D Mammographic images were obtained following ultrasound guided biopsy of a isoechoic mass at 3 o'clock 14 cm  from the nipple. The Heart shaped biopsy marking clip is in expected position at the site of biopsy. Site 2: 3D Mammographic images were obtained following ultrasound guided biopsy of a complex cystic and solid mass at 3 o'clock 13 cm from the nipple. The VENUS shaped biopsy marking  clip is in expected position at the site of biopsy. This is at the site of dominant mass noted within the lumpectomy bed IMPRESSION: 1. Appropriate positioning of the HEART shaped biopsy marking clip at the site of biopsy in the outer breast. 2. Appropriate positioning of the VENUS shaped biopsy marking clip at the site of biopsy in the outer breast. Final Assessment: Post Procedure Mammograms for Marker Placement Electronically Signed   By: Valentino Saxon M.D.   On: 09/23/2021 09:17  [place] LT BREAST BX W LOC DEV 1ST LESION IMG BX SPEC US GUIDE  Addendum Date: 09/25/2021   ADDENDUM REPORT: 09/25/2021 12:43 ADDENDUM: PATHOLOGY revealed: Site A. BREAST, LEFT, 3 O'CLOCK, 14 CM FROM NIPPLE (HEART CLIP); ULTRASOUND-GUIDED CORE BIOPSY: - INVASIVE MAMMARY CARCINOMA WITH MUCINOUS FEATURES. 8 mm in this sample. Grade 1. Ductal carcinoma in situ: Not identified. Lymphovascular invasion: Not identified. Pathology results are CONCORDANT with imaging findings, per Dr. Valentino Saxon. PATHOLOGY revealed: Site B. BREAST, LEFT, 3 O'CLOCK, 13 CM FROM NIPPLE (VENUS CLIP); ULTRASOUND-GUIDED CORE BIOPSY: - INVASIVE MAMMARY CARCINOMA WITH MUCINOUS FEATURES. 9 mm in this sample. Histologic grade of invasive carcinoma: Grade 1. Ductal carcinoma in situ: Not identified. Lymphovascular invasion: Not identified. Comment: The tumor has similar appearances in A and B, with more cystic features in the B sample. The histologic grade and mucinous features match the previously excised left breast multifocal carcinoma, based on review of the pathology reports from 2020: Pathology results are CONCORDANT with imaging findings, per Dr. Valentino Saxon. Pathology  results and recommendations below were discussed with patient and son Kyann Heydt) by telephone on 09/24/2021. Patient reported biopsy site within normal limits with slight tenderness at the site. Post biopsy care instructions were reviewed, questions were answered and my direct phone number was provided to patient. Patient was instructed to call [facility] if any concerns or questions arise related to the biopsy. RECOMMENDATIONS: Surgical consultation. Request for surgical consultation relayed to Al Pimple RN at [facility] by Electa Sniff RN on 09/24/2021. Pathology results reported by Electa Sniff RN on 09/25/2021. Electronically Signed   By: Valentino Saxon M.D.   On: 09/25/2021 12:43   Result Date: 09/25/2021 CLINICAL DATA:  Palpable area in the LEFT breast. 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 EXAM: ULTRASOUND GUIDED LEFT BREAST CORE NEEDLE BIOPSY x2 COMPARISON:  Previous exam(s). PROCEDURE: I met with the patient and we discussed the procedure of ultrasound-guided biopsy, including benefits and alternatives. We discussed the high likelihood of a successful procedure. We discussed the risks of the procedure, including infection, bleeding, tissue injury, clip migration, and inadequate sampling. Informed written consent was given. The usual time-out protocol was performed immediately prior to the procedure. Site 1: Isoechoic mass, 3:00 14cm FN Lesion quadrant: Upper outer quadrant Using sterile technique and 1% lidocaine and 1% lidocaine with epinephrine as local anesthetic, under direct ultrasound visualization, a 14 gauge spring-loaded device was used to perform biopsy of a mass at 3 o'clock 14 cm from the nipple using a lateral approach. At the conclusion of the procedure a HEART tissue marker clip was deployed into the biopsy cavity. Follow up 2 view mammogram was performed and dictated separately. Site 2: Cystic and  solid mass, 3:00 13cm FN Lesion quadrant: Upper outer quadrant Using sterile technique and 1% lidocaine and 1% lidocaine with epinephrine as local anesthetic, under direct ultrasound visualization, a 14 gauge spring-loaded device was used to perform biopsy  of a mass at 3 o'clock 13 cm from the nipple using a lateral approach. At the conclusion of the procedure a VENUS tissue marker clip was deployed into the biopsy cavity. Follow up 2 view mammogram was performed and dictated separately. IMPRESSION: Ultrasound guided biopsy of 2 adjacent breast masses at the lumpectomy bed. No apparent complications. Electronically Signed: By: Valentino Saxon M.D. On: 09/23/2021 09:13  [place] LT BREAST BX W LOC DEV EA ADD LESION IMG BX SPEC US GUIDE  Addendum Date: 09/25/2021   ADDENDUM REPORT: 09/25/2021 12:43 ADDENDUM: PATHOLOGY revealed: Site A. BREAST, LEFT, 3 O'CLOCK, 14 CM FROM NIPPLE (HEART CLIP); ULTRASOUND-GUIDED CORE BIOPSY: - INVASIVE MAMMARY CARCINOMA WITH MUCINOUS FEATURES. 8 mm in this sample. Grade 1. Ductal carcinoma in situ: Not identified. Lymphovascular invasion: Not identified. Pathology results are CONCORDANT with imaging findings, per Dr. Valentino Saxon. PATHOLOGY revealed: Site B. BREAST, LEFT, 3 O'CLOCK, 13 CM FROM NIPPLE (VENUS CLIP); ULTRASOUND-GUIDED CORE BIOPSY: - INVASIVE MAMMARY CARCINOMA WITH MUCINOUS FEATURES. 9 mm in this sample. Histologic grade of invasive carcinoma: Grade 1. Ductal carcinoma in situ: Not identified. Lymphovascular invasion: Not identified. Comment: The tumor has similar appearances in A and B, with more cystic features in the B sample. The histologic grade and mucinous features match the previously excised left breast multifocal carcinoma, based on review of the pathology reports from 2020: Pathology results are CONCORDANT with imaging findings, per Dr. Valentino Saxon. Pathology results and recommendations below were discussed with patient and son Bethlehem Langstaff) by  telephone on 09/24/2021. Patient reported biopsy site within normal limits with slight tenderness at the site. Post biopsy care instructions were reviewed, questions were answered and my direct phone number was provided to patient. Patient was instructed to call [facility] if any concerns or questions arise related to the biopsy. RECOMMENDATIONS: Surgical consultation. Request for surgical consultation relayed to Al Pimple RN at [facility] by Electa Sniff RN on 09/24/2021. Pathology results reported by Electa Sniff RN on 09/25/2021. Electronically Signed   By: Valentino Saxon M.D.   On: 09/25/2021 12:43   Result Date: 09/25/2021 CLINICAL DATA:  Palpable area in the LEFT breast. 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 EXAM: ULTRASOUND GUIDED LEFT BREAST CORE NEEDLE BIOPSY x2 COMPARISON:  Previous exam(s). PROCEDURE: I met with the patient and we discussed the procedure of ultrasound-guided biopsy, including benefits and alternatives. We discussed the high likelihood of a successful procedure. We discussed the risks of the procedure, including infection, bleeding, tissue injury, clip migration, and inadequate sampling. Informed written consent was given. The usual time-out protocol was performed immediately prior to the procedure. Site 1: Isoechoic mass, 3:00 14cm FN Lesion quadrant: Upper outer quadrant Using sterile technique and 1% lidocaine and 1% lidocaine with epinephrine as local anesthetic, under direct ultrasound visualization, a 14 gauge spring-loaded device was used to perform biopsy of a mass at 3 o'clock 14 cm from the nipple using a lateral approach. At the conclusion of the procedure a HEART tissue marker clip was deployed into the biopsy cavity. Follow up 2 view mammogram was performed and dictated separately. Site 2: Cystic and solid mass, 3:00 13cm FN Lesion quadrant: Upper outer quadrant Using sterile technique and  1% lidocaine and 1% lidocaine with epinephrine as local anesthetic, under direct ultrasound visualization, a 14 gauge spring-loaded device was used to perform biopsy of a mass at 3 o'clock 13 cm from the nipple using a lateral approach.  At the conclusion of the procedure a VENUS tissue marker clip was deployed into the biopsy cavity. Follow up 2 view mammogram was performed and dictated separately. IMPRESSION: Ultrasound guided biopsy of 2 adjacent breast masses at the lumpectomy bed. No apparent complications. Electronically Signed: By: Valentino Saxon M.D. 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 no obvious clinical concerns for metastatic disease at this time; no imaging done.  #Discussed that I would recommend mastectomy-which would most likely have her avoid radiation.  The other option being lumpectomy along with radiation.  Given multifocal disease-I would defer to surgery if lumpectomy is technically an option.  Will discuss with Dr. Lysle Pearl.  Given patient's son query regarding timeline of treatment options [given the upcoming holidays]-however I would recommend proceeding with surgery at the earliest.  #Given patient's comorbidities/frailty/mucinous tumor-would not recommend chemotherapy.  I discussed the role of antihormone therapy for at least 5 to 10 years.  #Bright red blood per rectum/nausea/abdominal discomfort-s/p GI evaluation August 2022 [EGD/[place];Dr.Toledo]. on plavix.  Recommend further evaluation in the emergency room today.  #History of TIA-on Plavix.  #Dysarthria-?  Anxiety as per family/nausea-?  Anxiety versus organic causes.  Await further evaluation in the emergency room.  Thank you Dr. Lovie Macadamia for allowing me to participate in the care of your pleasant patient. Please do not hesitate to contact me with  questions or concerns in the interim.   # DISPOSITION: #Emergency room #Follow-up to be decided    All questions were answered. The patient/family knows to call the clinic with any problems, questions or concerns.       Cammie Sickle, MD 10/06/2021 1:14 PM

## 2021-10-06 NOTE — ED Notes (Signed)
MD. Posey Pronto made aware of time frame of next NM scan.

## 2021-10-06 NOTE — Progress Notes (Signed)
Patient presented to clinic today as a new patient for breast cancer. Pt saw Dr. Rogue Bussing.  She was alert and oriented x 4 and in no acute distress. While Pt was assisted with undressing into a gown for a breast exam, pt started vomiting uncontrollably and felt the urgent to have a bowel movement. She kept repeating I am "pooping in my pants." 2 Rns took pt to bathroom. Pt stated that she felt dizzy and light headed. Pt assisted to toilet by both RNs. Pt had bright red blood on sanitary pad. Pt had explosive diarrhea with bright red bleeding in stool. Pt continued to report dizziness. While pt was sitting on toilet, Bp taken - 144/81 with HR of 55. Pt continued to vomit in emesis bag while on the toilet.  Pt cleaned up- pericare provided by clinical team and transported back to w/c. Dr. Rogue Bussing made aware. MD requested clinical team to send pt to ER for further work up and evaluation. On the way to the ER, pt displayed new disorientation/confusion. Pt kept grabbing the RNs arms and kept repeating "I'm going to fall." She was c/o back pain upon arrival to the ER  Hand off given to Anderson Malta, RN at front triage desk.

## 2021-10-07 ENCOUNTER — Other Ambulatory Visit: Payer: Medicare PPO

## 2021-10-07 DIAGNOSIS — K921 Melena: Principal | ICD-10-CM

## 2021-10-07 DIAGNOSIS — R531 Weakness: Secondary | ICD-10-CM

## 2021-10-07 DIAGNOSIS — K625 Hemorrhage of anus and rectum: Secondary | ICD-10-CM | POA: Diagnosis not present

## 2021-10-07 LAB — BASIC METABOLIC PANEL
Anion gap: 6 (ref 5–15)
BUN: 11 mg/dL (ref 8–23)
CO2: 28 mmol/L (ref 22–32)
Calcium: 8.7 mg/dL — ABNORMAL LOW (ref 8.9–10.3)
Chloride: 103 mmol/L (ref 98–111)
Creatinine, Ser: 1.14 mg/dL — ABNORMAL HIGH (ref 0.44–1.00)
GFR, Estimated: 50 mL/min — ABNORMAL LOW (ref >60)
Glucose, Bld: 95 mg/dL (ref 70–99)
Potassium: 3.4 mmol/L — ABNORMAL LOW (ref 3.5–5.1)
Sodium: 137 mmol/L (ref 135–145)

## 2021-10-07 LAB — CBC
HCT: 29.4 % — ABNORMAL LOW (ref 36.0–46.0)
HCT: 33.9 % — ABNORMAL LOW (ref 36.0–46.0)
Hemoglobin: 9.6 g/dL — ABNORMAL LOW (ref 12.0–15.0)
Hemoglobin: 10.9 g/dL — ABNORMAL LOW (ref 12.0–15.0)
MCH: 28.4 pg (ref 26.0–34.0)
MCH: 28.5 pg (ref 26.0–34.0)
MCHC: 32.7 g/dL (ref 30.0–36.0)
MCHC: 32.2 g/dL (ref 30.0–36.0)
MCV: 87 fL (ref 80.0–100.0)
MCV: 88.5 fL (ref 80.0–100.0)
Platelets: 198 10*3/uL (ref 150–400)
Platelets: 228 10*3/uL (ref 150–400)
RBC: 3.38 MIL/uL — ABNORMAL LOW (ref 3.87–5.11)
RBC: 3.83 MIL/uL — ABNORMAL LOW (ref 3.87–5.11)
RDW: 14.5 % (ref 11.5–15.5)
RDW: 14.5 % (ref 11.5–15.5)
WBC: 7.3 10*3/uL (ref 4.0–10.5)
WBC: 11.8 10*3/uL — ABNORMAL HIGH (ref 4.0–10.5)
nRBC: 0 % (ref 0.0–0.2)
nRBC: 0 % (ref 0.0–0.2)

## 2021-10-07 LAB — VITAMIN B12: Vitamin B-12: 588 pg/mL (ref 180–914)

## 2021-10-07 LAB — TSH: TSH: 4.152 u[IU]/mL (ref 0.350–4.500)

## 2021-10-07 LAB — T4, FREE: Free T4: 1.2 ng/dL — ABNORMAL HIGH (ref 0.61–1.12)

## 2021-10-07 LAB — MAGNESIUM: Magnesium: 2.1 mg/dL (ref 1.7–2.4)

## 2021-10-07 LAB — FERRITIN: Ferritin: 26 ng/mL (ref 11–307)

## 2021-10-07 LAB — TROPONIN I (HIGH SENSITIVITY): Troponin I (High Sensitivity): 21 ng/L — ABNORMAL HIGH (ref <18)

## 2021-10-07 MED ORDER — SODIUM CHLORIDE 0.9 % IV SOLN: INTRAVENOUS | Status: DC

## 2021-10-07 MED ORDER — LORAZEPAM 2 MG/ML IJ SOLN
0.5000 mg | Freq: Four times a day (QID) | INTRAMUSCULAR | Status: DC | PRN
  Administered 2021-10-07 – 2021-10-09 (×5): 0.5 mg via INTRAVENOUS
  Filled 2021-10-07 (×7): qty 1

## 2021-10-07 MED ORDER — DILTIAZEM HCL ER COATED BEADS 120 MG PO CP24
120.0000 mg | ORAL_CAPSULE | Freq: Every day | ORAL | Status: DC
  Administered 2021-10-09 – 2021-10-10 (×2): 120 mg via ORAL
  Filled 2021-10-07 (×4): qty 1

## 2021-10-07 MED ORDER — SODIUM CHLORIDE 0.9 % IV SOLN
12.5000 mg | Freq: Four times a day (QID) | INTRAVENOUS | Status: DC | PRN
  Administered 2021-10-07: 12.5 mg via INTRAVENOUS
  Filled 2021-10-07: qty 0.5
  Filled 2021-10-07: qty 12.5
  Filled 2021-10-07: qty 0.5

## 2021-10-07 MED ORDER — HYDRALAZINE HCL 20 MG/ML IJ SOLN
10.0000 mg | INTRAMUSCULAR | Status: DC | PRN
  Administered 2021-10-07: 10 mg via INTRAVENOUS
  Filled 2021-10-07: qty 1

## 2021-10-07 MED ORDER — METOPROLOL SUCCINATE ER 50 MG PO TB24
50.0000 mg | ORAL_TABLET | Freq: Two times a day (BID) | ORAL | Status: DC
  Administered 2021-10-07 – 2021-10-10 (×3): 50 mg via ORAL
  Filled 2021-10-07 (×5): qty 1

## 2021-10-07 MED ORDER — ATORVASTATIN CALCIUM 20 MG PO TABS
80.0000 mg | ORAL_TABLET | Freq: Every day | ORAL | Status: DC
  Administered 2021-10-10: 80 mg via ORAL
  Filled 2021-10-07 (×2): qty 4

## 2021-10-07 MED ORDER — PEG 3350-KCL-NA BICARB-NACL 420 G PO SOLR
4000.0000 mL | Freq: Once | ORAL | Status: AC
  Administered 2021-10-07: 4000 mL via ORAL
  Filled 2021-10-07: qty 4000

## 2021-10-07 MED ORDER — FLUOXETINE HCL 10 MG PO CAPS
10.0000 mg | ORAL_CAPSULE | Freq: Every day | ORAL | Status: DC
  Administered 2021-10-10: 10 mg via ORAL
  Filled 2021-10-07 (×5): qty 1

## 2021-10-07 MED ORDER — HYDRALAZINE HCL 25 MG PO TABS
25.0000 mg | ORAL_TABLET | Freq: Three times a day (TID) | ORAL | Status: DC
  Administered 2021-10-07 – 2021-10-10 (×4): 25 mg via ORAL
  Filled 2021-10-07 (×8): qty 1

## 2021-10-07 NOTE — Care Management CC44 (Signed)
Condition Code 44 Documentation Completed  Patient Details  Name: Cindy Huerta MRN: [id number] Date of Birth: 16-Oct-1946   Condition Code 44 given:  Yes Patient signature on Condition Code 44 notice:  Yes Documentation of 2 MD's agreement:  Yes Code 44 added to claim:  Yes    Adelene Amas, [place] 10/07/2021, 11:54 AM

## 2021-10-07 NOTE — ED Notes (Signed)
Pt given pillow and mouth swabs. Pt remains NPO for NM at this time.

## 2021-10-07 NOTE — Care Management Obs Status (Signed)
[place] NOTIFICATION   Patient Details  Name: Cindy Huerta MRN: [id number] Date of Birth: Feb 20, 1946   Medicare Observation Status Notification Given:  Yes    [name] 10/07/2021, 11:54 AM

## 2021-10-07 NOTE — Progress Notes (Signed)
PROGRESS NOTE    Cindy Huerta  FUX:[id number] DOB: 02/14/1946 DOA: 10/06/2021 PCP: Juluis Pitch, MD    Brief Narrative:  75 y.o. female seen in ed with complaints of bright red blood per rectum for the past few days and bloody bowel movement at her oncology clinic appointment today for her diagnosis of breast cancer.  During her appointment while in dressing patient started vomiting uncontrollably and had urged for bowel movement patient in the bathroom assisted by 2 RNs was lightheaded and dizzy and had explosive diarrhea with bright red bleeding and stool.  Vitals while on the commode was a blood pressure of 144/81 and heart rate of 55.  Was also vomiting in the emesis bag patient was symptomatic and dizzy and therefore was sent to the emergency room todayPatient had GI evaluation in August 2022 by Dr. Alice Reichert with an EGD and colonoscopy she is currently on Plavix and aspirin .  Dual antiplatelet therapy has been held.  GI on consult.  Patient has relatively stable hemoglobin with no signs of active bleeding.  Tagged red blood cell scan has been discontinued.  GI is aware and will follow up with patient later this afternoon and likely plan for endoscopic evaluation 11/3.   Assessment & Plan:   Principal Problem:   Rectal bleeding Active Problems:   Hypothyroidism   CKD (chronic kidney disease), stage IIIa   Benign essential hypertension   Carcinoma of overlapping sites of left breast in female, estrogen receptor positive ([facility])   Dizziness   Nausea & vomiting   GIB (gastrointestinal bleeding)   Weakness  Rectal bleeding Intractable nausea and vomiting Dizziness Suspect PUD/lower GI bleed GI on consult Hemoglobin relatively stable Nausea vomiting appears to have improved for now Plan: Twice daily IV PPI As needed antiemetics Trend hemoglobin Transfuse as needed Hb less than 8 GI consult Clear liquid diet today and n.p.o. after midnight in case endoscopic evaluation is  warranted  History of TIA Dual antiplatelet therapy held  Chronic kidney disease stage IIIa Creatinine baseline Avoid nonessential nephrotoxins  Hypothyroidism PTA Synthroid  Hyperlipidemia PTA statin  Essential hypertension Home regimen resumed     DVT prophylaxis: SCDs Code Status: Full Family Communication:Son [name] [phone] on 11/2 Disposition Plan: Status is: Observation  The patient remains OBS appropriate and will d/c before 2 midnights.      Level of care: Med-Surg  Consultants:  GI  Procedures:  None  Antimicrobials: None   Subjective: Seen and examined.  Endorses some thirst and stomach pain.  Objective: Vitals:   10/07/21 0830 10/07/21 0900 10/07/21 0930 10/07/21 1000  BP: 137/72 (!) 146/74 (!) 153/77 (!) 151/75  Pulse: 83 81 88 84  Resp: 20 15 (!) 21 17  Temp:      TempSrc:      SpO2: 93% 95% 94% 95%  Weight:       No intake or output data in the 24 hours ending 10/07/21 1359 Filed Weights   10/06/21 1240  Weight: 78 kg    Examination:  General exam: No acute distress.  Appears frail Respiratory system: Lungs clear.  Normal work of breathing.  Room air Cardiovascular system: Tachycardic, regular rhythm, no murmurs Gastrointestinal system: Soft, nondistended, mild TTP epigastrium, normal bowel sounds Central nervous system: Alert and oriented. No focal neurological deficits. Extremities: Symmetric 5 x 5 power. Skin: No rashes, lesions or ulcers Psychiatry: Judgement and insight appear normal. Mood & affect appropriate.     Data Reviewed: I have personally reviewed following  labs and imaging studies  CBC: Recent Labs  Lab 10/06/21 1535 10/06/21 2047 10/07/21 0706  WBC 9.1  --  7.3  HGB 10.8* 10.4* 9.6*  HCT 31.2* 30.8* 29.4*  MCV 86.9  --  87.0  PLT 214  --  144   Basic Metabolic Panel: Recent Labs  Lab 10/06/21 1535 10/07/21 0706  NA 137 137  K 3.3* 3.4*  CL 102 103  CO2 24 28  GLUCOSE 71 95  BUN 9 11   CREATININE 0.96 1.14*  CALCIUM 9.2 8.7*  MG  --  2.1   GFR: Estimated Creatinine Clearance: 43.6 mL/min (A) (by C-G formula based on SCr of 1.14 mg/dL (H)). Liver Function Tests: Recent Labs  Lab 10/06/21 1535  AST 29  ALT 16  ALKPHOS 50  BILITOT 0.7  PROT 7.7  ALBUMIN 4.0   No results for input(s): LIPASE, AMYLASE in the last 168 hours. No results for input(s): AMMONIA in the last 168 hours. Coagulation Profile: Recent Labs  Lab 10/06/21 1535  INR 1.3*   Cardiac Enzymes: No results for input(s): CKTOTAL, CKMB, CKMBINDEX, TROPONINI in the last 168 hours. BNP (last 3 results) No results for input(s): PROBNP in the last 8760 hours. HbA1C: No results for input(s): HGBA1C in the last 72 hours. CBG: No results for input(s): GLUCAP in the last 168 hours. Lipid Profile: No results for input(s): CHOL, HDL, LDLCALC, TRIG, CHOLHDL, LDLDIRECT in the last 72 hours. Thyroid Function Tests: Recent Labs    10/06/21 2226  TSH 4.152  FREET4 1.20*   Anemia Panel: Recent Labs    10/07/21 0706  VITAMINB12 588  FERRITIN 26   Sepsis Labs: No results for input(s): PROCALCITON, LATICACIDVEN in the last 168 hours.  Recent Results (from the past 240 hour(s))  Resp Panel by RT-PCR (Flu A&B, Covid) Nasopharyngeal Swab     Status: None   Collection Time: 10/06/21  3:25 PM   Specimen: Nasopharyngeal Swab; Nasopharyngeal(NP) swabs in vial transport medium  Result Value Ref Range Status   SARS Coronavirus 2 by RT PCR NEGATIVE NEGATIVE Final    Comment: (NOTE) SARS-CoV-2 target nucleic acids are NOT DETECTED.  The SARS-CoV-2 RNA is generally detectable in upper respiratory specimens during the acute phase of infection. The lowest concentration of SARS-CoV-2 viral copies this assay can detect is 138 copies/mL. A negative result does not preclude SARS-Cov-2 infection and should not be used as the sole basis for treatment or other patient management decisions. A negative result may  occur with  improper specimen collection/handling, submission of specimen other than nasopharyngeal swab, presence of viral mutation(s) within the areas targeted by this assay, and inadequate number of viral copies(<138 copies/mL). A negative result must be combined with clinical observations, patient history, and epidemiological information. The expected result is Negative.  Fact Sheet for Patients:  EntrepreneurPulse.com.au  Fact Sheet for Healthcare Providers:  IncredibleEmployment.be  This test is no t yet approved or cleared by the [place] FDA and  has been authorized for detection and/or diagnosis of SARS-CoV-2 by FDA under an Emergency Use Authorization (EUA). This EUA will remain  in effect (meaning this test can be used) for the duration of the COVID-19 declaration under Section 564(b)(1) of the Act, 21 U.S.C.section 360bbb-3(b)(1), unless the authorization is terminated  or revoked sooner.       Influenza A by PCR NEGATIVE NEGATIVE Final   Influenza B by PCR NEGATIVE NEGATIVE Final    Comment: (NOTE) The Xpert Xpress SARS-CoV-2/FLU/RSV plus assay is  intended as an aid in the diagnosis of influenza from Nasopharyngeal swab specimens and should not be used as a sole basis for treatment. Nasal washings and aspirates are unacceptable for Xpert Xpress SARS-CoV-2/FLU/RSV testing.  Fact Sheet for Patients: EntrepreneurPulse.com.au  Fact Sheet for Healthcare Providers: IncredibleEmployment.be  This test is not yet approved or cleared by the [place] FDA and has been authorized for detection and/or diagnosis of SARS-CoV-2 by FDA under an Emergency Use Authorization (EUA). This EUA will remain in effect (meaning this test can be used) for the duration of the COVID-19 declaration under Section 564(b)(1) of the Act, 21 U.S.C. section 360bbb-3(b)(1), unless the authorization is terminated  or revoked.  Performed at [facility], [address] [place], [place] [address]          Radiology Studies: CT Head Wo Contrast  Result Date: 10/06/2021 CLINICAL DATA:  A 75 year old female with new diagnosis of breast cancer is presents with sudden onset of vomiting and disorientation. EXAM: CT HEAD WITHOUT CONTRAST TECHNIQUE: Contiguous axial images were obtained from the base of the skull through the vertex without intravenous contrast. COMPARISON:  July 29, 2021, CT and MRI of the brain. FINDINGS: Brain: No evidence of acute infarction, hemorrhage, hydrocephalus, extra-axial collection or mass lesion/mass effect. Signs of atrophy and chronic microvascular ischemic change. Findings unchanged compared to previous imaging Vascular: No hyperdense vessel or unexpected calcification. Skull: Normal. Negative for fracture or focal lesion. Sinuses/Orbits: Visualized paranasal sinuses and orbits are unremarkable. Other: None IMPRESSION: No acute intracranial abnormality. Signs of atrophy and chronic microvascular ischemic change. Electronically Signed   By: Zetta Bills M.D.   On: 10/06/2021 13:14   CT ABDOMEN PELVIS W CONTRAST  Result Date: 10/06/2021 CLINICAL DATA:  Lower GI bleed, abdominal pain, history hypertension, newly diagnosed breast cancer EXAM: CT ABDOMEN AND PELVIS WITH CONTRAST TECHNIQUE: Multidetector CT imaging of the abdomen and pelvis was performed using the standard protocol following bolus administration of intravenous contrast. CONTRAST:  181mL OMNIPAQUE IOHEXOL 300 MG/ML  SOLN COMPARISON:  04/01/2021 FINDINGS: Lower chest: Lung bases clear Hepatobiliary: Gallbladder surgically absent. Cyst lateral segment LEFT lobe liver 7 mm diameter previously 6 mm. Remainder of liver normal appearance. Pancreas: Normal appearance Spleen: Normal appearance Adrenals/Urinary Tract: Adrenal glands, kidneys, ureters, and bladder normal appearance Stomach/Bowel: Appendix not  identified. Ingested radiopacity within cecum. Scattered colonic diverticulosis without evidence of diverticulitis. Stomach and remaining bowel loops unremarkable. Vascular/Lymphatic: Atherosclerotic calcifications aorta and iliac arteries without aneurysm. No adenopathy. Reproductive: Uterus surgically absent with nonvisualization of ovaries Other: No free air or free fluid. No hernia or inflammatory process. Musculoskeletal: Diffuse osseous demineralization. IMPRESSION: Colonic diverticulosis without evidence of diverticulitis. No acute intra-abdominal or intrapelvic abnormalities. Aortic Atherosclerosis (ICD10-I70.0). Electronically Signed   By: Lavonia Dana M.D.   On: 10/06/2021 18:06        Scheduled Meds:  levothyroxine  75 mcg Oral q morning   pantoprazole (PROTONIX) IV  40 mg Intravenous Q12H   Continuous Infusions:  lactated ringers 100 mL/hr at 10/07/21 0730     LOS: 1 day    Time spent: 35 minutes    Sidney Ace, MD [facility]   If 7PM-7AM, please contact night-coverage  10/07/2021, 1:59 PM

## 2021-10-07 NOTE — Consult Note (Signed)
Jonathon Bellows , MD [address] [place], [place], [place], [address] [id number], [place], [place], [place], [address] Phone: [phone]  Fax: [phone]  Consultation  Referring Provider:     ER Primary Care Physician:  Juluis Pitch, MD Primary Gastroenterologist:  Dr. Alice Reichert         Reason for Consultation:     GI bleed  Date of Admission:  10/06/2021 Date of Consultation:  10/07/2021         HPI:   Cindy Huerta is a 75 y.o. female who is known in the outpatient to Dr. Alice Reichert and was last seen on 05/14/2021 for nausea and vomiting.  Had in 2019 undergone a colonoscopy and a few polyps were resected.  Diverticulosis of the sigmoid colon was noted.  EGD showed no gross abnormality except for possible Candida.  Status postcholecystectomy.  Small hiatal hernia on upper GI series seen in May 2022.  He had seen him previously for GERD.  The patient presented to the ER with bright red blood per rectum for a few days.  When she went to see her oncologist for breast cancer had uncontrollable vomiting followed by bowel movements and explosive diarrhea.  She is on Plavix and aspirin.  Sent to the ER.  Baseline hemoglobin about 3 months back with 11.8 g.  On admission was 10.8 g.,  INR of 1.3, TSH normal and this morning hemoglobin was 9.6 g no elevation in BUN/creatinine ratio   She says the rectal bleeding began 2 days back and last episode was earlier today , still has nausea and non bloody vomiting ongoing. She was about to throw up when I went into see her. Denies any use of NSAID's, has some abdominal discomfort.   10/06/2021: CT abdomen pelvis with contrast showed colonic diverticulosis otherwise normal  Past Medical History:  Diagnosis Date  . Breast cancer ([place])   . Depression   . HLD (hyperlipidemia)   . Hypertension   . Hypothyroidism     Past Surgical History:  Procedure Laterality Date  . BREAST BIOPSY Left 09/23/2021   3:00 13cmfn venus marker, path pending  .  BREAST BIOPSY Left 09/23/2021   3:00 14 cmfn heart marker, path pending  . CHOLECYSTECTOMY    . left lumpectomy      Prior to Admission medications   Medication Sig Start Date End Date Taking? Authorizing Provider  acetaminophen (TYLENOL) 500 MG tablet Take 500-1,000 mg by mouth as needed for pain.    [provider]  aspirin 81 MG EC tablet Take 81 mg by mouth daily.    [provider]  atorvastatin (LIPITOR) 80 MG tablet Take 80 mg by mouth daily. 09/21/21   [provider]  clopidogrel (PLAVIX) 75 MG tablet Take 75 mg by mouth daily. 11/23/19   [provider]  Cyanocobalamin (VITAMIN B-12 PO) Take 1 tablet by mouth daily.    [provider]  diltiazem (CARDIZEM CD) 120 MG 24 hr capsule Take by mouth. 04/16/21   [provider]  fexofenadine (ALLEGRA) 180 MG tablet Take 180 mg by mouth as needed for allergies.    [provider]  FLUoxetine (PROZAC) 10 MG capsule Take 10 mg by mouth daily. 06/25/21   [provider]  hydrALAZINE (APRESOLINE) 25 MG tablet Take 25 mg by mouth 3 (three) times daily. 02/04/21   [provider]  levothyroxine (SYNTHROID) 75 MCG tablet Take 75 mcg by mouth every morning. 02/10/21   [provider]  LORazepam (ATIVAN) 0.5 MG tablet Take 0.25-0.5 mg by mouth every 12 (twelve) hours as needed for anxiety. Patient not taking: Reported on 10/06/2021 06/19/21   [provider]  magnesium oxide (MAG-OX) 400 MG tablet Take 400 mg by mouth daily.    [provider]  meclizine (ANTIVERT) 12.5 MG tablet Take 12.5 mg by mouth as needed for dizziness. Patient not taking: Reported on 10/06/2021    [provider]  metoprolol succinate (TOPROL-XL) 50 MG 24 hr tablet Take 50 mg by mouth 2 (two) times daily. 03/18/21   [provider]  Multiple Vitamins-Minerals (THERA-M) TABS Take 1 tablet by mouth daily.    [provider]  ondansetron (ZOFRAN ODT) 4 MG  disintegrating tablet Take 1 tablet (4 mg total) by mouth every 8 (eight) hours as needed for nausea or vomiting. Patient not taking: Reported on 10/06/2021 07/09/21   Vladimir Crofts, MD  pantoprazole (PROTONIX) 40 MG tablet Take 40 mg by mouth daily.    [provider]  potassium chloride SA (KLOR-CON) 20 MEQ tablet Take 20 mEq by mouth daily.    [provider]  telmisartan (MICARDIS) 80 MG tablet Take 80 mg by mouth daily. 10/06/18   [provider]    Family History  Problem Relation Age of Onset  . Heart disease Mother   . Cancer Paternal Grandmother      Social History   Tobacco Use  . Smoking status: Never  . Smokeless tobacco: Never  Substance Use Topics  . Alcohol use: Never  . Drug use: Never    Allergies as of 10/06/2021 - Review Complete 10/06/2021  Allergen Reaction Noted  . Latex Rash 04/25/2019  . Lisinopril Cough 12/13/2018  . Procaine Other (See Comments) 07/10/2013  . Diphenhydramine hcl Other (See Comments) 01/09/2018  . Clarithromycin Other (See Comments) 07/10/2013  . Oxycodone Nausea And Vomiting and Anxiety 02/02/2021    Review of Systems:    All systems reviewed and negative except where noted in HPI.   Physical Exam:  Vital signs in last 24 hours: Temp:  [97.9 F (36.6 C)-98.4 F (36.9 C)] 98.4 F (36.9 C) (11/01 2033) Pulse Rate:  [69-120] 85 (11/02 0745) Resp:  [11-34] 17 (11/02 0745) BP: (133-200)/(56-107) 135/68 (11/02 0730) SpO2:  [90 %-100 %] 95 % (11/02 0745) Weight:  [78 kg] 78 kg (11/01 1240)   General:   Pleasant, cooperative in NAD Head:  Normocephalic and atraumatic. Eyes:   No icterus.   Conjunctiva pink. PERRLA. Ears:  Normal auditory acuity. Neck:  Supple; no masses or thyroidomegaly Lungs: Respirations even and unlabored. Lungs clear to auscultation bilaterally.   No wheezes, crackles, or rhonchi.  Heart:  Regular rate and rhythm;  Without murmur, clicks, rubs or gallops Abdomen:  Soft, nondistended,  nontender. Normal bowel sounds. No appreciable masses or hepatomegaly.  No rebound or guarding.  Neurologic:  Alert and oriented x3;  grossly normal neurologically. Skin:  Intact without significant lesions or rashes. Cervical Nodes:  No significant cervical adenopathy. Psych:  Alert and cooperative. Normal affect.  LAB RESULTS: Recent Labs    10/06/21 1535 10/06/21 2047 10/07/21 0706  WBC 9.1  --  7.3  HGB 10.8* 10.4* 9.6*  HCT 31.2* 30.8* 29.4*  PLT 214  --  198   BMET Recent Labs    10/06/21 1535 10/07/21 0706  NA 137 137  K 3.3* 3.4*  CL 102 103  CO2 24 28  GLUCOSE 71 95  BUN 9  11  CREATININE 0.96 1.14*  CALCIUM 9.2 8.7*   LFT Recent Labs    10/06/21 1535  PROT 7.7  ALBUMIN 4.0  AST 29  ALT 16  ALKPHOS 50  BILITOT 0.7   PT/INR Recent Labs    10/06/21 1535  LABPROT 15.7*  INR 1.3*    STUDIES: CT Head Wo Contrast  Result Date: 10/06/2021 CLINICAL DATA:  A 75 year old female with new diagnosis of breast cancer is presents with sudden onset of vomiting and disorientation. EXAM: CT HEAD WITHOUT CONTRAST TECHNIQUE: Contiguous axial images were obtained from the base of the skull through the vertex without intravenous contrast. COMPARISON:  July 29, 2021, CT and MRI of the brain. FINDINGS: Brain: No evidence of acute infarction, hemorrhage, hydrocephalus, extra-axial collection or mass lesion/mass effect. Signs of atrophy and chronic microvascular ischemic change. Findings unchanged compared to previous imaging Vascular: No hyperdense vessel or unexpected calcification. Skull: Normal. Negative for fracture or focal lesion. Sinuses/Orbits: Visualized paranasal sinuses and orbits are unremarkable. Other: None IMPRESSION: No acute intracranial abnormality. Signs of atrophy and chronic microvascular ischemic change. Electronically Signed   By: Zetta Bills M.D.   On: 10/06/2021 13:14   CT ABDOMEN PELVIS W CONTRAST  Result Date: 10/06/2021 CLINICAL DATA:  Lower GI  bleed, abdominal pain, history hypertension, newly diagnosed breast cancer EXAM: CT ABDOMEN AND PELVIS WITH CONTRAST TECHNIQUE: Multidetector CT imaging of the abdomen and pelvis was performed using the standard protocol following bolus administration of intravenous contrast. CONTRAST:  161mL OMNIPAQUE IOHEXOL 300 MG/ML  SOLN COMPARISON:  04/01/2021 FINDINGS: Lower chest: Lung bases clear Hepatobiliary: Gallbladder surgically absent. Cyst lateral segment LEFT lobe liver 7 mm diameter previously 6 mm. Remainder of liver normal appearance. Pancreas: Normal appearance Spleen: Normal appearance Adrenals/Urinary Tract: Adrenal glands, kidneys, ureters, and bladder normal appearance Stomach/Bowel: Appendix not identified. Ingested radiopacity within cecum. Scattered colonic diverticulosis without evidence of diverticulitis. Stomach and remaining bowel loops unremarkable. Vascular/Lymphatic: Atherosclerotic calcifications aorta and iliac arteries without aneurysm. No adenopathy. Reproductive: Uterus surgically absent with nonvisualization of ovaries Other: No free air or free fluid. No hernia or inflammatory process. Musculoskeletal: Diffuse osseous demineralization. IMPRESSION: Colonic diverticulosis without evidence of diverticulitis. No acute intra-abdominal or intrapelvic abnormalities. Aortic Atherosclerosis (ICD10-I70.0). Electronically Signed   By: Lavonia Dana M.D.   On: 10/06/2021 18:06      Impression / Plan:   Cindy Huerta is a 75 y.o. y/o female with acute onset nausea, vomiting and rectal bleeding. Denies any hematemesis.  Differentials include a stomach bug for the vomiting and a diverticular bleed.   Plan  EGD+colonoscopy tomorrow. If unable to do the prep due to nausea then will proceed with EGD only  Monitor CBC and transfuse as needed  I have discussed alternative options, risks & benefits,  which include, but are not limited to, bleeding, infection, perforation,respiratory complication &  drug reaction.  The patient agrees with this plan & written consent will be obtained.      Thank you for involving me in the care of this patient.      LOS: 1 day   Jonathon Bellows, MD  10/07/2021, 8:53 AM

## 2021-10-07 NOTE — ED Notes (Signed)
Brandon RN aware of assigned bed °

## 2021-10-08 ENCOUNTER — Encounter
Admission: EM | Disposition: A | Discharge status: Home Health | Payer: Self-pay | Source: Home / Self Care | Attending: Internal Medicine

## 2021-10-08 ENCOUNTER — Observation Stay: Payer: Medicare PPO | Admitting: Anesthesiology

## 2021-10-08 ENCOUNTER — Encounter: Payer: Self-pay | Admitting: Internal Medicine

## 2021-10-08 DIAGNOSIS — R112 Nausea with vomiting, unspecified: Secondary | ICD-10-CM | POA: Diagnosis not present

## 2021-10-08 DIAGNOSIS — K625 Hemorrhage of anus and rectum: Secondary | ICD-10-CM | POA: Diagnosis not present

## 2021-10-08 HISTORY — PX: ESOPHAGOGASTRODUODENOSCOPY (EGD) WITH PROPOFOL: SHX5813

## 2021-10-08 LAB — CBC WITH DIFFERENTIAL/PLATELET
Abs Immature Granulocytes: 0.04 10*3/uL (ref 0.00–0.07)
Basophils Absolute: 0 10*3/uL (ref 0.0–0.1)
Basophils Relative: 0 %
Eosinophils Absolute: 0.4 10*3/uL (ref 0.0–0.5)
Eosinophils Relative: 4 %
HCT: 33 % — ABNORMAL LOW (ref 36.0–46.0)
Hemoglobin: 10.6 g/dL — ABNORMAL LOW (ref 12.0–15.0)
Immature Granulocytes: 0 %
Lymphocytes Relative: 16 %
Lymphs Abs: 1.5 10*3/uL (ref 0.7–4.0)
MCH: 28.3 pg (ref 26.0–34.0)
MCHC: 32.1 g/dL (ref 30.0–36.0)
MCV: 88.2 fL (ref 80.0–100.0)
Monocytes Absolute: 0.9 10*3/uL (ref 0.1–1.0)
Monocytes Relative: 9 %
Neutro Abs: 6.7 10*3/uL (ref 1.7–7.7)
Neutrophils Relative %: 71 %
Platelets: 217 10*3/uL (ref 150–400)
RBC: 3.74 MIL/uL — ABNORMAL LOW (ref 3.87–5.11)
RDW: 14.4 % (ref 11.5–15.5)
WBC: 9.7 10*3/uL (ref 4.0–10.5)
nRBC: 0 % (ref 0.0–0.2)

## 2021-10-08 SURGERY — ESOPHAGOGASTRODUODENOSCOPY (EGD) WITH PROPOFOL
Anesthesia: General

## 2021-10-08 MED ORDER — SODIUM CHLORIDE 0.9 % IV SOLN: INTRAVENOUS | Status: DC

## 2021-10-08 MED ORDER — SODIUM CHLORIDE 0.9 % IV SOLN
INTRAVENOUS | Status: DC
  Administered 2021-10-08: 1000 mL via INTRAVENOUS

## 2021-10-08 MED ORDER — PROPOFOL 500 MG/50ML IV EMUL
INTRAVENOUS | Status: DC | PRN
  Administered 2021-10-08: 125 ug/kg/min via INTRAVENOUS

## 2021-10-08 MED ORDER — PROPOFOL 10 MG/ML IV BOLUS
INTRAVENOUS | Status: DC | PRN
  Administered 2021-10-08: 60 mg via INTRAVENOUS

## 2021-10-08 MED ORDER — PEG 3350-KCL-NA BICARB-NACL 420 G PO SOLR
4000.0000 mL | Freq: Once | ORAL | Status: AC
  Administered 2021-10-08: 4000 mL via ORAL
  Filled 2021-10-08: qty 4000

## 2021-10-08 MED ORDER — HYDRALAZINE HCL 20 MG/ML IJ SOLN
10.0000 mg | INTRAMUSCULAR | Status: DC | PRN
  Administered 2021-10-08 – 2021-10-10 (×3): 10 mg via INTRAVENOUS
  Filled 2021-10-08 (×4): qty 1

## 2021-10-08 MED ORDER — KETOROLAC TROMETHAMINE 30 MG/ML IJ SOLN
15.0000 mg | Freq: Once | INTRAMUSCULAR | Status: AC
  Administered 2021-10-08: 15 mg via INTRAVENOUS
  Filled 2021-10-08: qty 1

## 2021-10-08 MED ORDER — LIDOCAINE HCL (CARDIAC) PF 100 MG/5ML IV SOSY
PREFILLED_SYRINGE | INTRAVENOUS | Status: DC | PRN
  Administered 2021-10-08: 40 mg via INTRAVENOUS

## 2021-10-08 NOTE — Anesthesia Preprocedure Evaluation (Signed)
Anesthesia Evaluation  Patient identified by MRN, date of birth, ID band Patient awake and Patient confused    Reviewed: Allergy & Precautions, NPO status , Patient's Chart, lab work & pertinent test results  Airway Mallampati: II  TM Distance: >3 FB Neck ROM: full    Dental  (+) Upper Dentures, Lower Dentures   Pulmonary neg pulmonary ROS,    Pulmonary exam normal  + decreased breath sounds      Cardiovascular Exercise Tolerance: Poor hypertension, Pt. on medications negative cardio ROS Normal cardiovascular exam Rhythm:Regular Rate:Normal     Neuro/Psych Depression negative neurological ROS  negative psych ROS   GI/Hepatic negative GI ROS, Neg liver ROS, GERD  ,  Endo/Other  negative endocrine ROSHypothyroidism   Renal/GU Renal diseasenegative Renal ROS  negative genitourinary   Musculoskeletal negative musculoskeletal ROS (+)   Abdominal (+) + obese,   Peds negative pediatric ROS (+)  Hematology negative hematology ROS (+)   Anesthesia Other Findings Past Medical History: No date: Breast cancer ([place]) No date: Depression No date: HLD (hyperlipidemia) No date: Hypertension No date: Hypothyroidism  Past Surgical History: 09/23/2021: BREAST BIOPSY; Left     Comment:  3:00 13cmfn venus marker, path pending 09/23/2021: BREAST BIOPSY; Left     Comment:  3:00 14 cmfn heart marker, path pending No date: CHOLECYSTECTOMY No date: left lumpectomy  BMI    Body Mass Index: 27.76 kg/m      Reproductive/Obstetrics negative OB ROS                             Anesthesia Physical Anesthesia Plan  ASA: 3  Anesthesia Plan: General   Post-op Pain Management:    Induction: Intravenous  PONV Risk Score and Plan: Propofol infusion and TIVA  Airway Management Planned: Nasal Cannula  Additional Equipment:   Intra-op Plan:   Post-operative Plan:   Informed Consent: I have reviewed  the patients History and Physical, chart, labs and discussed the procedure including the risks, benefits and alternatives for the proposed anesthesia with the patient or authorized representative who has indicated his/her understanding and acceptance.     Dental Advisory Given  Plan Discussed with: CRNA and Surgeon  Anesthesia Plan Comments:         Anesthesia Quick Evaluation

## 2021-10-08 NOTE — Op Note (Signed)
[facility] Patient Name: Cindy Huerta Procedure Date: 10/08/2021 1:03 PM MRN: [id number] Account #: [id number] Date of Birth: [birth year] Admit Type: Outpatient Age: 75 Room: [facility] ENDO ROOM 3 Gender: Female Note Status: Finalized Instrument Name: Altamese Cabal Endoscope [id number] Procedure:             Upper GI endoscopy Indications:           Nausea with vomiting Providers:             Jonathon Bellows MD, MD Referring MD:          Youlanda Roys. Lovie Macadamia, MD (Referring MD) Medicines:             Monitored Anesthesia Care Complications:         No immediate complications. Procedure:             Pre-Anesthesia Assessment:                        - Prior to the procedure, a History and Physical was                         performed, and patient medications, allergies and                         sensitivities were reviewed. The patient's tolerance                         of previous anesthesia was reviewed.                        - The risks and benefits of the procedure and the                         sedation options and risks were discussed with the                         patient. All questions were answered and informed                         consent was obtained.                        - ASA Grade Assessment: III - A patient with severe                         systemic disease.                        After obtaining informed consent, the endoscope was                         passed under direct vision. Throughout the procedure,                         the patient's blood pressure, pulse, and oxygen                         saturations were monitored continuously. The Endoscope  was introduced through the mouth, and advanced to the                         third part of duodenum. The upper GI endoscopy was                         accomplished with ease. The patient tolerated the                         procedure well. Findings:      The  esophagus was normal.      The stomach was normal.      The examined duodenum was normal. Impression:            - Normal esophagus.                        - Normal stomach.                        - Normal examined duodenum.                        - No specimens collected. Recommendation:        - Discharge patient to home (with escort).                        - Resume previous diet.                        - Continue present medications. Procedure Code(s):     --- Professional ---                        [phone], Esophagogastroduodenoscopy, flexible,                         transoral; diagnostic, including collection of                         specimen(s) by brushing or washing, when performed                         (separate procedure) Diagnosis Code(s):     --- Professional ---                        R11.2, Nausea with vomiting, unspecified CPT copyright 2019 [employer]. All rights reserved. The codes documented in this report are preliminary and upon coder review may  be revised to meet current compliance requirements. Jonathon Bellows, MD Jonathon Bellows MD, MD 10/08/2021 1:45:54 PM This report has been signed electronically. Number of Addenda: 0 Note Initiated On: 10/08/2021 1:03 PM Estimated Blood Loss:  Estimated blood loss: none.      [facility]

## 2021-10-08 NOTE — Progress Notes (Signed)
Pt attempted to drink bowel prep this evening, but became extremely nauseous and shaky after 1.5 cups.  She began vomiting and could not finish. IV phenergan was given and eventually ativan to help calm her nerves.  She says she has not eaten anything solid in a couple of days and has had multiple bowel movements.  Will continue to monitor.

## 2021-10-08 NOTE — Progress Notes (Signed)
PROGRESS NOTE    Cindy Huerta  IZT:[id number] DOB: 04-Jan-1946 DOA: 10/06/2021 PCP: Juluis Pitch, MD    Brief Narrative:  75 y.o. female seen in ed with complaints of bright red blood per rectum for the past few days and bloody bowel movement at her oncology clinic appointment today for her diagnosis of breast cancer.  During her appointment while in dressing patient started vomiting uncontrollably and had urged for bowel movement patient in the bathroom assisted by 2 RNs was lightheaded and dizzy and had explosive diarrhea with bright red bleeding and stool.  Vitals while on the commode was a blood pressure of 144/81 and heart rate of 55.  Was also vomiting in the emesis bag patient was symptomatic and dizzy and therefore was sent to the emergency room todayPatient had GI evaluation in August 2022 by Dr. Alice Reichert with an EGD and colonoscopy she is currently on Plavix and aspirin .  Dual antiplatelet therapy has been held.  GI on consult.  Patient has relatively stable hemoglobin with no signs of active bleeding.  Tagged red blood cell scan has been discontinued.  GI consulted with tentative plans for endoscopy and colonoscopy.  However patient could not tolerate bowel prep on 11/2.  Anticipate GI will proceed with EGD today.   Assessment & Plan:   Principal Problem:   Rectal bleeding Active Problems:   Hypothyroidism   CKD (chronic kidney disease), stage IIIa   Benign essential hypertension   Carcinoma of overlapping sites of left breast in female, estrogen receptor positive (HCC)   Dizziness   Nausea & vomiting   GIB (gastrointestinal bleeding)   Weakness  Rectal bleeding Intractable nausea and vomiting Dizziness Suspect PUD/lower GI bleed GI on consult Hemoglobin relatively stable Nausea vomiting persistent Could not tolerate bowel prep Plan: N.p.o. for EGD today Twice daily IV PPI IV antiemetics as needed Trend hemoglobin Transfuse as needed Hb less than 8 GI  consult N.p.o. since midnight  History of TIA Dual antiplatelet therapy held Review of outpatient neurology notes MRI completed in August 2022 demonstrates no subacute or acute infarction.  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  Depending on results of endoscopic evaluation risks of ongoing antiplatelet agent may outweigh benefits and may need to de-escalate to aspirin monotherapy.  Chronic kidney disease stage IIIa Creatinine baseline Avoid nonessential nephrotoxins  Hypothyroidism PTA Synthroid  Hyperlipidemia PTA statin  Essential hypertension Home regimen resumed     DVT prophylaxis: SCDs Code Status: Full Family Communication:Son [name] [phone] on 11/2 Disposition Plan: Status is: Observation  The patient will require care spanning > 2 midnights and should be moved to inpatient because: GI bleed.  Pending endoscopic evaluation.  Also intractable nausea and vomiting and inability to tolerate p.o.  Seen and examined.  Has been vomiting.  Voice hoarse as a result.  N.p.o. for EGD today.  Level of care: Med-Surg  Consultants:  GI  Procedures:  None  Antimicrobials: None   Subjective: Seen and examined.  Endorses some thirst and stomach pain.  Objective: Vitals:   10/07/21 1535 10/07/21 2113 10/08/21 0335 10/08/21 0758  BP: (!) 188/86 (!) 170/64 (!) 156/79 (!) 160/65  Pulse: (!) 110 86 80 78  Resp: 18 20 18 17   Temp: 98.3 F (36.8 C) 98.9 F (37.2 C) 98.6 F (37 C) 97.9 F (36.6 C)  TempSrc: Oral  Oral Oral  SpO2: 99% 100% 99% 98%  Weight:        Intake/Output Summary (Last 24 hours) at 10/08/2021 1125  Last data filed at 10/08/2021 0600 Gross per 24 hour  Intake 3378.06 ml  Output --  Net 3378.06 ml   Filed Weights   10/06/21 1240  Weight: 78 kg    Examination:  General exam: No acute distress.  Appears frail.  Hoarse voice Respiratory system: Lungs clear.  Normal work of breathing.  Room air Cardiovascular system:  Tachycardic, regular rhythm, no murmurs Gastrointestinal system: Soft, nondistended, TTP epigastrium, normal bowel sounds Central nervous system: Alert and oriented. No focal neurological deficits. Extremities: Symmetric 5 x 5 power. Skin: No rashes, lesions or ulcers Psychiatry: Judgement and insight appear normal. Mood & affect appropriate.     Data Reviewed: I have personally reviewed following labs and imaging studies  CBC: Recent Labs  Lab 10/06/21 1535 10/06/21 2047 10/07/21 0706 10/07/21 1644 10/08/21 0730  WBC 9.1  --  7.3 11.8* 9.7  NEUTROABS  --   --   --   --  6.7  HGB 10.8* 10.4* 9.6* 10.9* 10.6*  HCT 31.2* 30.8* 29.4* 33.9* 33.0*  MCV 86.9  --  87.0 88.5 88.2  PLT 214  --  198 228 676   Basic Metabolic Panel: Recent Labs  Lab 10/06/21 1535 10/07/21 0706  NA 137 137  K 3.3* 3.4*  CL 102 103  CO2 24 28  GLUCOSE 71 95  BUN 9 11  CREATININE 0.96 1.14*  CALCIUM 9.2 8.7*  MG  --  2.1   GFR: Estimated Creatinine Clearance: 43.6 mL/min (A) (by C-G formula based on SCr of 1.14 mg/dL (H)). Liver Function Tests: Recent Labs  Lab 10/06/21 1535  AST 29  ALT 16  ALKPHOS 50  BILITOT 0.7  PROT 7.7  ALBUMIN 4.0   No results for input(s): LIPASE, AMYLASE in the last 168 hours. No results for input(s): AMMONIA in the last 168 hours. Coagulation Profile: Recent Labs  Lab 10/06/21 1535  INR 1.3*   Cardiac Enzymes: No results for input(s): CKTOTAL, CKMB, CKMBINDEX, TROPONINI in the last 168 hours. BNP (last 3 results) No results for input(s): PROBNP in the last 8760 hours. HbA1C: No results for input(s): HGBA1C in the last 72 hours. CBG: No results for input(s): GLUCAP in the last 168 hours. Lipid Profile: No results for input(s): CHOL, HDL, LDLCALC, TRIG, CHOLHDL, LDLDIRECT in the last 72 hours. Thyroid Function Tests: Recent Labs    10/06/21 2226  TSH 4.152  FREET4 1.20*   Anemia Panel: Recent Labs    10/07/21 0706  VITAMINB12 588  FERRITIN  26   Sepsis Labs: No results for input(s): PROCALCITON, LATICACIDVEN in the last 168 hours.  Recent Results (from the past 240 hour(s))  Resp Panel by RT-PCR (Flu A&B, Covid) Nasopharyngeal Swab     Status: None   Collection Time: 10/06/21  3:25 PM   Specimen: Nasopharyngeal Swab; Nasopharyngeal(NP) swabs in vial transport medium  Result Value Ref Range Status   SARS Coronavirus 2 by RT PCR NEGATIVE NEGATIVE Final    Comment: (NOTE) SARS-CoV-2 target nucleic acids are NOT DETECTED.  The SARS-CoV-2 RNA is generally detectable in upper respiratory specimens during the acute phase of infection. The lowest concentration of SARS-CoV-2 viral copies this assay can detect is 138 copies/mL. A negative result does not preclude SARS-Cov-2 infection and should not be used as the sole basis for treatment or other patient management decisions. A negative result may occur with  improper specimen collection/handling, submission of specimen other than nasopharyngeal swab, presence of viral mutation(s) within the areas targeted by  this assay, and inadequate number of viral copies(<138 copies/mL). A negative result must be combined with clinical observations, patient history, and epidemiological information. The expected result is Negative.  Fact Sheet for Patients:  EntrepreneurPulse.com.au  Fact Sheet for Healthcare Providers:  IncredibleEmployment.be  This test is no t yet approved or cleared by the [place] FDA and  has been authorized for detection and/or diagnosis of SARS-CoV-2 by FDA under an Emergency Use Authorization (EUA). This EUA will remain  in effect (meaning this test can be used) for the duration of the COVID-19 declaration under Section 564(b)(1) of the Act, 21 U.S.C.section 360bbb-3(b)(1), unless the authorization is terminated  or revoked sooner.       Influenza A by PCR NEGATIVE NEGATIVE Final   Influenza B by PCR NEGATIVE NEGATIVE  Final    Comment: (NOTE) The Xpert Xpress SARS-CoV-2/FLU/RSV plus assay is intended as an aid in the diagnosis of influenza from Nasopharyngeal swab specimens and should not be used as a sole basis for treatment. Nasal washings and aspirates are unacceptable for Xpert Xpress SARS-CoV-2/FLU/RSV testing.  Fact Sheet for Patients: EntrepreneurPulse.com.au  Fact Sheet for Healthcare Providers: IncredibleEmployment.be  This test is not yet approved or cleared by the [place] FDA and has been authorized for detection and/or diagnosis of SARS-CoV-2 by FDA under an Emergency Use Authorization (EUA). This EUA will remain in effect (meaning this test can be used) for the duration of the COVID-19 declaration under Section 564(b)(1) of the Act, 21 U.S.C. section 360bbb-3(b)(1), unless the authorization is terminated or revoked.  Performed at [facility], [address] [place], [place] [address]          Radiology Studies: CT Head Wo Contrast  Result Date: 10/06/2021 CLINICAL DATA:  A 75 year old female with new diagnosis of breast cancer is presents with sudden onset of vomiting and disorientation. EXAM: CT HEAD WITHOUT CONTRAST TECHNIQUE: Contiguous axial images were obtained from the base of the skull through the vertex without intravenous contrast. COMPARISON:  July 29, 2021, CT and MRI of the brain. FINDINGS: Brain: No evidence of acute infarction, hemorrhage, hydrocephalus, extra-axial collection or mass lesion/mass effect. Signs of atrophy and chronic microvascular ischemic change. Findings unchanged compared to previous imaging Vascular: No hyperdense vessel or unexpected calcification. Skull: Normal. Negative for fracture or focal lesion. Sinuses/Orbits: Visualized paranasal sinuses and orbits are unremarkable. Other: None IMPRESSION: No acute intracranial abnormality. Signs of atrophy and chronic microvascular ischemic change.  Electronically Signed   By: Zetta Bills M.D.   On: 10/06/2021 13:14   CT ABDOMEN PELVIS W CONTRAST  Result Date: 10/06/2021 CLINICAL DATA:  Lower GI bleed, abdominal pain, history hypertension, newly diagnosed breast cancer EXAM: CT ABDOMEN AND PELVIS WITH CONTRAST TECHNIQUE: Multidetector CT imaging of the abdomen and pelvis was performed using the standard protocol following bolus administration of intravenous contrast. CONTRAST:  152mL OMNIPAQUE IOHEXOL 300 MG/ML  SOLN COMPARISON:  04/01/2021 FINDINGS: Lower chest: Lung bases clear Hepatobiliary: Gallbladder surgically absent. Cyst lateral segment LEFT lobe liver 7 mm diameter previously 6 mm. Remainder of liver normal appearance. Pancreas: Normal appearance Spleen: Normal appearance Adrenals/Urinary Tract: Adrenal glands, kidneys, ureters, and bladder normal appearance Stomach/Bowel: Appendix not identified. Ingested radiopacity within cecum. Scattered colonic diverticulosis without evidence of diverticulitis. Stomach and remaining bowel loops unremarkable. Vascular/Lymphatic: Atherosclerotic calcifications aorta and iliac arteries without aneurysm. No adenopathy. Reproductive: Uterus surgically absent with nonvisualization of ovaries Other: No free air or free fluid. No hernia or inflammatory process. Musculoskeletal: Diffuse osseous demineralization. IMPRESSION:  Colonic diverticulosis without evidence of diverticulitis. No acute intra-abdominal or intrapelvic abnormalities. Aortic Atherosclerosis (ICD10-I70.0). Electronically Signed   By: Lavonia Dana M.D.   On: 10/06/2021 18:06        Scheduled Meds:  atorvastatin  80 mg Oral Daily   diltiazem  120 mg Oral Daily   FLUoxetine  10 mg Oral Daily   hydrALAZINE  25 mg Oral TID   levothyroxine  75 mcg Oral q morning   metoprolol succinate  50 mg Oral BID   pantoprazole (PROTONIX) IV  40 mg Intravenous Q12H   Continuous Infusions:  sodium chloride     lactated ringers 100 mL/hr at 10/08/21  0520   promethazine (PHENERGAN) injection (IM or IVPB) Stopped (10/07/21 2233)     LOS: 1 day    Time spent: 25 minutes    Sidney Ace, MD [facility]   If 7PM-7AM, please contact night-coverage  10/08/2021, 11:25 AM

## 2021-10-08 NOTE — OR Nursing (Signed)
IV 'S both infiltrated. Removed and new iv started in L. Industrial/product designer.

## 2021-10-08 NOTE — Progress Notes (Signed)
PIV consult: Request for second site for PRN pushes. Appears pt is receiving LR continuous infusion. Recommend flushing site with saline pre and post PRN medication pushes. To preserve vessels, a second site for IV pushes is not recommended.

## 2021-10-08 NOTE — H&P (Signed)
Jonathon Bellows, MD [address], [place], [place], [place], [address] [address], [place], [place], [place], [address] Phone: [phone]  Fax: [phone]  Primary Care Physician:  Juluis Pitch, MD   Pre-Procedure History & Physical: HPI:  Cindy Huerta is a 75 y.o. female is here for an endoscopy    Past Medical History:  Diagnosis Date   Breast cancer ([place])    Depression    HLD (hyperlipidemia)    Hypertension    Hypothyroidism     Past Surgical History:  Procedure Laterality Date   BREAST BIOPSY Left 09/23/2021   3:00 13cmfn venus marker, path pending   BREAST BIOPSY Left 09/23/2021   3:00 14 cmfn heart marker, path pending   CHOLECYSTECTOMY     left lumpectomy      Prior to Admission medications   Medication Sig Start Date End Date Taking? Authorizing Provider  diltiazem (CARDIZEM CD) 120 MG 24 hr capsule Take 120 mg by mouth daily. 04/16/21  Yes [provider]  levothyroxine (SYNTHROID) 75 MCG tablet Take 75 mcg by mouth every morning. 02/10/21  Yes [provider]  acetaminophen (TYLENOL) 500 MG tablet Take 500-1,000 mg by mouth as needed for pain.    [provider]  aspirin 81 MG EC tablet Take 81 mg by mouth daily.    [provider]  atorvastatin (LIPITOR) 80 MG tablet Take 80 mg by mouth daily. 09/21/21   [provider]  clopidogrel (PLAVIX) 75 MG tablet Take 75 mg by mouth daily. 11/23/19   [provider]  Cyanocobalamin (VITAMIN B-12 PO) Take 1 tablet by mouth daily.    [provider]  fexofenadine (ALLEGRA) 180 MG tablet Take 180 mg by mouth as needed for allergies.    [provider]  FLUoxetine (PROZAC) 10 MG capsule Take 10 mg by mouth daily. 06/25/21   [provider]  hydrALAZINE (APRESOLINE) 25 MG tablet Take 25 mg by mouth 3 (three) times daily. 02/04/21   [provider]  LORazepam (ATIVAN) 0.5 MG tablet Take 0.25-0.5 mg by mouth every 12 (twelve)  hours as needed for anxiety. Patient not taking: Reported on 10/06/2021 06/19/21   [provider]  magnesium oxide (MAG-OX) 400 MG tablet Take 400 mg by mouth daily.    [provider]  meclizine (ANTIVERT) 12.5 MG tablet Take 12.5 mg by mouth as needed for dizziness. Patient not taking: Reported on 10/06/2021    [provider]  metoprolol succinate (TOPROL-XL) 50 MG 24 hr tablet Take 50 mg by mouth 2 (two) times daily. 03/18/21   [provider]  Multiple Vitamins-Minerals (THERA-M) TABS Take 1 tablet by mouth daily.    [provider]  ondansetron (ZOFRAN ODT) 4 MG disintegrating tablet Take 1 tablet (4 mg total) by mouth every 8 (eight) hours as needed for nausea or vomiting. Patient not taking: Reported on 10/06/2021 07/09/21   Vladimir Crofts, MD  pantoprazole (PROTONIX) 40 MG tablet Take 40 mg by mouth daily.    [provider]  potassium chloride SA (KLOR-CON) 20 MEQ tablet Take 20 mEq by mouth daily.    [provider]  telmisartan (MICARDIS) 80 MG tablet Take 80 mg by mouth daily. 10/06/18   [provider]    Allergies as of 10/06/2021 - Review Complete 10/06/2021  Allergen Reaction Noted   Latex Rash 04/25/2019   Lisinopril Cough 12/13/2018   Procaine Other (See Comments) 07/10/2013   Diphenhydramine hcl Other (See  Comments) 01/09/2018   Clarithromycin Other (See Comments) 07/10/2013   Oxycodone Nausea And Vomiting and Anxiety 02/02/2021    Family History  Problem Relation Age of Onset   Heart disease Mother    Cancer Paternal Grandmother     Social History   Socioeconomic History   Marital status: Widowed    Spouse name: Not on file   Number of children: Not on file   Years of education: Not on file   Highest education level: Not on file  Occupational History   Not on file  Tobacco Use   Smoking status: Never   Smokeless tobacco: Never  Substance and Sexual Activity   Alcohol use: Never   Drug use:  Never   Sexual activity: Not on file  Other Topics Concern   Not on file  Social History Narrative   Currently lives in with [place] in [place]; out side uses cane/ to keep steady [hx of ministrokes-Jan 2022]; never smoked; no alcohol. Tailoring/sewing- used own buisness    Social Determinants of Radio broadcast assistant Strain: Not on file  Food Insecurity: Not on file  Transportation Needs: Not on file  Physical Activity: Not on file  Stress: Not on file  Social Connections: Not on file  Intimate Partner Violence: Not on file    Review of Systems: See HPI, otherwise negative ROS  Physical Exam: BP (!) 160/65 (BP Location: Right Wrist)   Pulse 75   Temp (!) 96 F (35.6 C) (Temporal)   Resp 20   Ht 5\' 6"  (1.676 m)   Wt 78 kg   SpO2 98%   BMI 27.76 kg/m  General:   Alert,  pleasant and cooperative in NAD Head:  Normocephalic and atraumatic. Neck:  Supple; no masses or thyromegaly. Lungs:  Clear throughout to auscultation, normal respiratory effort.    Heart:  +S1, +S2, Regular rate and rhythm, No edema. Abdomen:  Soft, nontender and nondistended. Normal bowel sounds, without guarding, and without rebound.   Neurologic:  Alert and  oriented x4;  grossly normal neurologically.  Impression/Plan: Cindy Huerta is here for an endoscopy  to be performed for  evaluation of vomiting    Risks, benefits, limitations, and alternatives regarding endoscopy have been reviewed with the patient.  Questions have been answered.  All parties agreeable.   Jonathon Bellows, MD  10/08/2021, 1:06 PM

## 2021-10-08 NOTE — Transfer of Care (Signed)
Immediate Anesthesia Transfer of Care Note  Patient: Cindy Huerta  Procedure(s) Performed: Procedure(s): ESOPHAGOGASTRODUODENOSCOPY (EGD) WITH PROPOFOL (N/A)  Patient Location: PACU and Endoscopy Unit  Anesthesia Type:General  Level of Consciousness: sedated  Airway & Oxygen Therapy: Patient Spontanous Breathing and Patient connected to nasal cannula oxygen  Post-op Assessment: Report given to RN and Post -op Vital signs reviewed and stable  Post vital signs: Reviewed and stable  Last Vitals:  Vitals:   10/08/21 1249 10/08/21 1350  BP:  (!) 146/64  Pulse: 75 80  Resp: 20 (!) 22  Temp: (!) 35.6 C (!) 36.4 C  SpO2: 14% 83%    Complications: No apparent anesthesia complications

## 2021-10-09 ENCOUNTER — Encounter: Payer: Self-pay | Admitting: Gastroenterology

## 2021-10-09 ENCOUNTER — Encounter
Admission: EM | Disposition: A | Discharge status: Home Health | Payer: Self-pay | Source: Home / Self Care | Attending: Internal Medicine

## 2021-10-09 ENCOUNTER — Observation Stay: Payer: Medicare PPO

## 2021-10-09 DIAGNOSIS — R4701 Aphasia: Secondary | ICD-10-CM | POA: Diagnosis present

## 2021-10-09 DIAGNOSIS — Z79890 Hormone replacement therapy (postmenopausal): Secondary | ICD-10-CM | POA: Diagnosis not present

## 2021-10-09 DIAGNOSIS — K921 Melena: Secondary | ICD-10-CM | POA: Diagnosis present

## 2021-10-09 DIAGNOSIS — E039 Hypothyroidism, unspecified: Secondary | ICD-10-CM | POA: Diagnosis present

## 2021-10-09 DIAGNOSIS — Z91040 Latex allergy status: Secondary | ICD-10-CM | POA: Diagnosis not present

## 2021-10-09 DIAGNOSIS — I129 Hypertensive chronic kidney disease with stage 1 through stage 4 chronic kidney disease, or unspecified chronic kidney disease: Secondary | ICD-10-CM | POA: Diagnosis present

## 2021-10-09 DIAGNOSIS — Z7982 Long term (current) use of aspirin: Secondary | ICD-10-CM | POA: Diagnosis not present

## 2021-10-09 DIAGNOSIS — F32A Depression, unspecified: Secondary | ICD-10-CM | POA: Diagnosis present

## 2021-10-09 DIAGNOSIS — K625 Hemorrhage of anus and rectum: Secondary | ICD-10-CM | POA: Diagnosis not present

## 2021-10-09 DIAGNOSIS — Z20822 Contact with and (suspected) exposure to covid-19: Secondary | ICD-10-CM | POA: Diagnosis present

## 2021-10-09 DIAGNOSIS — R410 Disorientation, unspecified: Secondary | ICD-10-CM | POA: Diagnosis present

## 2021-10-09 DIAGNOSIS — R112 Nausea with vomiting, unspecified: Secondary | ICD-10-CM | POA: Diagnosis present

## 2021-10-09 DIAGNOSIS — K219 Gastro-esophageal reflux disease without esophagitis: Secondary | ICD-10-CM | POA: Diagnosis present

## 2021-10-09 DIAGNOSIS — Z86010 Personal history of colonic polyps: Secondary | ICD-10-CM | POA: Diagnosis not present

## 2021-10-09 DIAGNOSIS — Z66 Do not resuscitate: Secondary | ICD-10-CM | POA: Diagnosis present

## 2021-10-09 DIAGNOSIS — Z8249 Family history of ischemic heart disease and other diseases of the circulatory system: Secondary | ICD-10-CM | POA: Diagnosis not present

## 2021-10-09 DIAGNOSIS — Z8673 Personal history of transient ischemic attack (TIA), and cerebral infarction without residual deficits: Secondary | ICD-10-CM | POA: Diagnosis not present

## 2021-10-09 DIAGNOSIS — C50812 Malignant neoplasm of overlapping sites of left female breast: Secondary | ICD-10-CM | POA: Diagnosis present

## 2021-10-09 DIAGNOSIS — Z170 Estrogen receptor positive status [ER+]: Secondary | ICD-10-CM | POA: Diagnosis not present

## 2021-10-09 DIAGNOSIS — Z7902 Long term (current) use of antithrombotics/antiplatelets: Secondary | ICD-10-CM | POA: Diagnosis not present

## 2021-10-09 DIAGNOSIS — Z79899 Other long term (current) drug therapy: Secondary | ICD-10-CM | POA: Diagnosis not present

## 2021-10-09 DIAGNOSIS — I672 Cerebral atherosclerosis: Secondary | ICD-10-CM | POA: Diagnosis present

## 2021-10-09 DIAGNOSIS — E785 Hyperlipidemia, unspecified: Secondary | ICD-10-CM | POA: Diagnosis present

## 2021-10-09 DIAGNOSIS — R42 Dizziness and giddiness: Secondary | ICD-10-CM | POA: Diagnosis present

## 2021-10-09 DIAGNOSIS — N1831 Chronic kidney disease, stage 3a: Secondary | ICD-10-CM | POA: Diagnosis present

## 2021-10-09 LAB — BASIC METABOLIC PANEL
Anion gap: 8 (ref 5–15)
Anion gap: 9 (ref 5–15)
BUN: 7 mg/dL — ABNORMAL LOW (ref 8–23)
BUN: 6 mg/dL — ABNORMAL LOW (ref 8–23)
CO2: 27 mmol/L (ref 22–32)
CO2: 25 mmol/L (ref 22–32)
Calcium: 8.9 mg/dL (ref 8.9–10.3)
Calcium: 9 mg/dL (ref 8.9–10.3)
Chloride: 102 mmol/L (ref 98–111)
Chloride: 103 mmol/L (ref 98–111)
Creatinine, Ser: 1.01 mg/dL — ABNORMAL HIGH (ref 0.44–1.00)
Creatinine, Ser: 0.98 mg/dL (ref 0.44–1.00)
GFR, Estimated: 57 mL/min — ABNORMAL LOW (ref >60)
GFR, Estimated: 59 mL/min — ABNORMAL LOW (ref >60)
Glucose, Bld: 92 mg/dL (ref 70–99)
Glucose, Bld: 135 mg/dL — ABNORMAL HIGH (ref 70–99)
Potassium: 3.3 mmol/L — ABNORMAL LOW (ref 3.5–5.1)
Potassium: 3 mmol/L — ABNORMAL LOW (ref 3.5–5.1)
Sodium: 137 mmol/L (ref 135–145)
Sodium: 137 mmol/L (ref 135–145)

## 2021-10-09 LAB — CBC WITH DIFFERENTIAL/PLATELET
Abs Immature Granulocytes: 0.04 10*3/uL (ref 0.00–0.07)
Basophils Absolute: 0 10*3/uL (ref 0.0–0.1)
Basophils Relative: 0 %
Eosinophils Absolute: 0.2 10*3/uL (ref 0.0–0.5)
Eosinophils Relative: 2 %
HCT: 31 % — ABNORMAL LOW (ref 36.0–46.0)
Hemoglobin: 10 g/dL — ABNORMAL LOW (ref 12.0–15.0)
Immature Granulocytes: 0 %
Lymphocytes Relative: 11 %
Lymphs Abs: 1.2 10*3/uL (ref 0.7–4.0)
MCH: 28.6 pg (ref 26.0–34.0)
MCHC: 32.3 g/dL (ref 30.0–36.0)
MCV: 88.6 fL (ref 80.0–100.0)
Monocytes Absolute: 0.9 10*3/uL (ref 0.1–1.0)
Monocytes Relative: 8 %
Neutro Abs: 8.5 10*3/uL — ABNORMAL HIGH (ref 1.7–7.7)
Neutrophils Relative %: 79 %
Platelets: 193 10*3/uL (ref 150–400)
RBC: 3.5 MIL/uL — ABNORMAL LOW (ref 3.87–5.11)
RDW: 14.2 % (ref 11.5–15.5)
WBC: 10.8 10*3/uL — ABNORMAL HIGH (ref 4.0–10.5)
nRBC: 0 % (ref 0.0–0.2)

## 2021-10-09 LAB — LACTIC ACID, PLASMA
Lactic Acid, Venous: 1.2 mmol/L (ref 0.5–1.9)
Lactic Acid, Venous: 3 mmol/L (ref 0.5–1.9)

## 2021-10-09 SURGERY — COLONOSCOPY WITH PROPOFOL
Anesthesia: General

## 2021-10-09 MED ORDER — POTASSIUM CHLORIDE IN NACL 20-0.9 MEQ/L-% IV SOLN
INTRAVENOUS | Status: DC
  Filled 2021-10-09 (×4): qty 1000

## 2021-10-09 MED ORDER — SODIUM CHLORIDE 0.9 % IV SOLN
300.0000 mg | Freq: Once | INTRAVENOUS | Status: AC
  Administered 2021-10-09: 300 mg via INTRAVENOUS
  Filled 2021-10-09: qty 15

## 2021-10-09 MED ORDER — SODIUM CHLORIDE 0.9 % IV SOLN
INTRAVENOUS | Status: DC
Start: 2021-10-09 — End: 2021-10-09

## 2021-10-09 MED ORDER — PANTOPRAZOLE SODIUM 40 MG PO TBEC
40.0000 mg | DELAYED_RELEASE_TABLET | Freq: Two times a day (BID) | ORAL | Status: DC
  Administered 2021-10-09 – 2021-10-10 (×3): 40 mg via ORAL
  Filled 2021-10-09 (×3): qty 1

## 2021-10-09 MED ORDER — SODIUM CHLORIDE 0.9% FLUSH
10.0000 mL | Freq: Two times a day (BID) | INTRAVENOUS | Status: DC
  Administered 2021-10-10: 10 mL

## 2021-10-09 MED ORDER — SODIUM CHLORIDE 0.9% FLUSH: 10.0000 mL | INTRAVENOUS | Status: DC | PRN

## 2021-10-09 MED ORDER — KETOROLAC TROMETHAMINE 30 MG/ML IJ SOLN
15.0000 mg | Freq: Once | INTRAMUSCULAR | Status: AC
  Administered 2021-10-09: 15 mg via INTRAVENOUS
  Filled 2021-10-09: qty 1

## 2021-10-09 MED ORDER — DICYCLOMINE HCL 10 MG PO CAPS
10.0000 mg | ORAL_CAPSULE | Freq: Four times a day (QID) | ORAL | Status: DC | PRN
  Filled 2021-10-09: qty 1

## 2021-10-09 MED ORDER — GABAPENTIN 600 MG PO TABS
300.0000 mg | ORAL_TABLET | Freq: Two times a day (BID) | ORAL | Status: DC
  Administered 2021-10-09 – 2021-10-10 (×2): 300 mg via ORAL
  Filled 2021-10-09 (×2): qty 1

## 2021-10-09 MED ORDER — KETOROLAC TROMETHAMINE 15 MG/ML IJ SOLN
INTRAMUSCULAR | Status: AC
  Administered 2021-10-09: 15 mg
  Filled 2021-10-09: qty 1

## 2021-10-09 MED ORDER — KETOROLAC TROMETHAMINE 30 MG/ML IJ SOLN: 15.0000 mg | Freq: Once | INTRAMUSCULAR | Status: AC

## 2021-10-09 MED ORDER — POTASSIUM CHLORIDE 10 MEQ/100ML IV SOLN
10.0000 meq | INTRAVENOUS | Status: AC
  Administered 2021-10-09 (×4): 10 meq via INTRAVENOUS
  Filled 2021-10-09 (×2): qty 100

## 2021-10-09 MED ORDER — MAGNESIUM SULFATE 2 GM/50ML IV SOLN
2.0000 g | Freq: Once | INTRAVENOUS | Status: AC
  Administered 2021-10-09: 2 g via INTRAVENOUS
  Filled 2021-10-09: qty 50

## 2021-10-09 NOTE — TOC Initial Note (Signed)
Transition of Care [facility]) - Initial/Assessment Note    Patient Details  Name: Cindy Huerta MRN: [id number] Date of Birth: [birth year]  [facility] CM/SW Contact:    Beverly Sessions, RN Phone Number: 10/09/2021, 11:58 AM  Clinical Narrative:                  Patient admitted from home with rectal bleeding Patient states that she lives at home with her son, and her son provides transportation to her appointments  PCP [facility] and mail order.  Denies issues obtaining medications  Patient states that she has a RW and cane in the home, but that baseline she is independent and does not use DME  No TOC needs anticipated, please consult if indicated   Expected Discharge Plan: Home/Self Care Barriers to Discharge: Continued Medical Work up   Patient Goals and CMS Choice        Expected Discharge Plan and Services Expected Discharge Plan: Home/Self Care       Living arrangements for the past 2 months: Single Family Home                                      Prior Living Arrangements/Services Living arrangements for the past 2 months: Single Family Home Lives with:: Adult Children Patient language and need for interpreter reviewed:: Yes Do you feel safe going back to the place where you live?: Yes      Need for Family Participation in Patient Care: Yes (Comment) Care giver support system in place?: Yes (comment) Current home services: DME Criminal Activity/Legal Involvement Pertinent to Current Situation/Hospitalization: No - Comment as needed  Activities of Daily Living      Permission Sought/Granted                  Emotional Assessment Appearance:: Appears stated age     Orientation: : Oriented to Self, Oriented to Place, Oriented to  Time, Oriented to Situation Alcohol / Substance Use: Not Applicable Psych Involvement: No (comment)  Admission diagnosis:  Hematochezia [K92.1] Diverticulosis [K57.90] Weakness  [R53.1] GIB (gastrointestinal bleeding) [K92.2] Patient Active Problem List   Diagnosis Date Noted   Weakness 10/07/2021   Carcinoma of overlapping sites of left breast in female, estrogen receptor positive ([place]) 10/06/2021   Dizziness 10/06/2021   Rectal bleeding 10/06/2021   Nausea & vomiting 10/06/2021   GIB (gastrointestinal bleeding) 10/06/2021   Left-sided weakness 07/29/2021   HLD (hyperlipidemia) 07/29/2021   Hypothyroidism 07/29/2021   Left sided numbness 07/29/2021   Depression    CKD (chronic kidney disease), stage IIIa    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   Epigastric pain 04/23/2021   GERD (gastroesophageal reflux disease) 04/23/2021   Benign essential hypertension 01/20/2021   History of breast cancer 01/20/2021   History of TIA (transient ischemic attack) 01/20/2021   Palpitations 01/20/2021   Malignant neoplasm of upper-outer quadrant of left female breast ([employer]) 12/06/2018   PCP:  Juluis Pitch, MD Pharmacy:   [id number] [place], [place] [address] Phone: [phone] Fax: [phone]     Social Determinants of Health (SDOH) Interventions    Readmission Risk Interventions Readmission Risk Prevention Plan 10/09/2021  Transportation Screening Complete  Medication Review (RN CM) Complete  Some recent data might be hidden

## 2021-10-09 NOTE — Progress Notes (Signed)
44 MD notified patient with sudden onset severe abdominal pain 10/10, guarding abdomen, nausea, gagging, after trying to eat some chicken noodle soup. See orders.  1430 abdominal pain resolved, no vomiting or nausea.  1500 in bathroom heart rate 160-170, severe headache, dizzy. Assisted to bed. MD paged. See new orders. Severe anxiety/shaking. PRN ativan given. See vitals.  1530 headache and nausea resolved.

## 2021-10-09 NOTE — Evaluation (Signed)
Physical Therapy Evaluation Patient Details Name: Cindy Huerta MRN: [id number] DOB: 07/16/1946 Today's Date: 10/09/2021  History of Present Illness  Pt is a 76 y/o F admitted on 10/06/21 after presenting with c/o bright red blood per rectum for the past few days as well as an emesis episode. PMH: breast CA, depression, HLD, HTN, hypothyrodism  Clinical Impression  Pt seen for PT evaluation with co-tx with OT. Pt appears pleasant & agreeable to tx, but emotional & eager to wash up. Pt is able to complete supine>sit with supervision & ambulate around bed with min assist +2 with assistance for managing IV pole & hand support for pt as pt frequently reaching for furniture to stabilize herself. Pt does c/o her legs feeling "dizzy" after not being up for a couple of days. Pt assisted onto toilet & had continent void & small BM when nurse entered room reporting elevated HR that did not decrease with seated rest break. Pt assisted back to bed with mod assist +2 to ambulate toilet>bed & provided +2 assist for sit>supine. Pt then c/o significant "stabbing" HA in front/R side of head --nurse in room & aware. Pt left in care of nursing staff. HR noted to be 166 bpm max, decreased to 113 bpm once supine in bed.      Recommendations for follow up therapy are one component of a multi-disciplinary discharge planning process, led by the attending physician.  Recommendations may be updated based on patient status, additional functional criteria and insurance authorization.  Follow Up Recommendations Home health PT    Assistance Recommended at Discharge Frequent or constant Supervision/Assistance  Functional Status Assessment Patient has had a recent decline in their functional status and demonstrates the ability to make significant improvements in function in a reasonable and predictable amount of time.  Equipment Recommendations  Rolling walker (2 wheels);3in1 (PT)    Recommendations for Other Services        Precautions / Restrictions Precautions Precautions: Fall Restrictions Weight Bearing Restrictions: No      Mobility  Bed Mobility Overal bed mobility: Needs Assistance Bed Mobility: Supine to Sit;Sit to Supine     Supine to sit: Supervision;HOB elevated (use of bed rails) Sit to supine: +2 for safety/equipment (staff assists pt with sit>supine as pt with decreased initiation of movement & needs to perform quickly)        Transfers Overall transfer level: Needs assistance Equipment used: None Transfers: Sit to/from Stand Sit to Stand: Min assist                Ambulation/Gait Ambulation/Gait assistance: Min assist;+2 safety/equipment;Mod assist Gait Distance (Feet):  (12 + 7 ft) Assistive device: 2 person hand held assist Gait Pattern/deviations: Decreased step length - right;Decreased step length - left;Decreased stride length Gait velocity: decreased   General Gait Details: Pt c/o "my legs feel dizzy" from not being up for a couple of days; pt attempts to reach/hold to furniture while moving  Stairs            Wheelchair Mobility    Modified Rankin (Stroke Patients Only)       Balance Overall balance assessment: Needs assistance Sitting-balance support: Feet supported Sitting balance-Leahy Scale: Fair Sitting balance - Comments: close supervision static sitting   Standing balance support: Bilateral upper extremity supported;During functional activity Standing balance-Leahy Scale: Poor Standing balance comment: BUE & min<>mod assist +2  Pertinent Vitals/Pain Pain Assessment: Faces Faces Pain Scale: Hurts worst Pain Location: R/frontal HA at end of session Pain Descriptors / Indicators: Stabbing Pain Intervention(s): Monitored during session;Repositioned (notified nurse)    Home Living Family/patient expects to be discharged to:: Private residence Living Arrangements: Children Available Help at  Discharge: Family;Available 24 hours/day (son works from home) Type of Home: House Home Access: Stairs to enter Entrance Stairs-Rails: None Technical brewer of Steps: 1 Alternate Level Stairs-Number of Steps: 1/2 flight + landing + 1/2 flight Home Layout: Bed/bath upstairs;Two level Home Equipment: Conservation officer, nature (2 wheels);Cane - quad      Prior Function Prior Level of Function : Independent/Modified Independent             Mobility Comments: Pt & son report pt was independent without AD prior to [employer] Dominance        Extremity/Trunk Assessment   Upper Extremity Assessment Upper Extremity Assessment: Generalized weakness    Lower Extremity Assessment Lower Extremity Assessment: Generalized weakness       Communication   Communication:  (Pt with expressive difficulties (son reports pt is anxious re: sitaution))  Cognition Arousal/Alertness: Awake/alert Behavior During Therapy: WFL for tasks assessed/performed;Anxious Overall Cognitive Status: Within Functional Limits for tasks assessed                                 General Comments: Pt anxious, oriented to situation, location, self, but appears emotional with PT/OT providing support/encouragement during session        General Comments      Exercises     Assessment/Plan    PT Assessment Patient needs continued PT services  PT Problem List Decreased strength;Decreased mobility;Decreased safety awareness;Decreased activity tolerance;Decreased balance;Decreased knowledge of use of DME       PT Treatment Interventions DME instruction;Therapeutic activities;Modalities;Gait training;Therapeutic exercise;Patient/family education;Stair training;Balance training;Functional mobility training;Neuromuscular re-education;Manual techniques    PT Goals (Current goals can be found in the Care Plan section)  Acute Rehab PT Goals Patient Stated Goal: head stop hurting PT Goal  Formulation: With patient Time For Goal Achievement: 10/23/21 Potential to Achieve Goals: Good    Frequency Min 2X/week   Barriers to discharge        Co-evaluation PT/OT/SLP Co-Evaluation/Treatment: Yes Reason for Co-Treatment: For patient/therapist safety;To address functional/ADL transfers PT goals addressed during session: Mobility/safety with mobility;Balance         AM-PAC PT "6 Clicks" Mobility  Outcome Measure Help needed turning from your back to your side while in a flat bed without using bedrails?: A Little Help needed moving from lying on your back to sitting on the side of a flat bed without using bedrails?: A Little Help needed moving to and from a bed to a chair (including a wheelchair)?: A Little Help needed standing up from a chair using your arms (e.g., wheelchair or bedside chair)?: A Little Help needed to walk in hospital room?: A Lot Help needed climbing 3-5 steps with a railing? : A Lot 6 Click Score: 16    End of Session   Activity Tolerance: Treatment limited secondary to medical complications (Comment) Patient left: in bed;with nursing/sitter in room   PT Visit Diagnosis: Unsteadiness on feet (R26.81);Difficulty in walking, not elsewhere classified (R26.2);Muscle weakness (generalized) (M62.81)    Time: 1442-1500 PT Time Calculation (min) (ACUTE ONLY): 18 min   Charges:   PT Evaluation $PT Eval High Complexity: 1  High          Lavone Nian, PT, DPT 10/09/21, 3:28 PM   Waunita Schooner 10/09/2021, 3:21 PM

## 2021-10-09 NOTE — Progress Notes (Signed)
PROGRESS NOTE    Cindy Huerta  VEH:[id number] DOB: [birth year] DOA: 10/06/2021 PCP: Juluis Pitch, MD    Brief Narrative:  75 y.o. female seen in ed with complaints of bright red blood per rectum for the past few days and bloody bowel movement at her oncology clinic appointment today for her diagnosis of breast cancer.  During her appointment while in dressing patient started vomiting uncontrollably and had urged for bowel movement patient in the bathroom assisted by 2 RNs was lightheaded and dizzy and had explosive diarrhea with bright red bleeding and stool.  Vitals while on the commode was a blood pressure of 144/81 and heart rate of 55.  Was also vomiting in the emesis bag patient was symptomatic and dizzy and therefore was sent to the emergency room todayPatient had GI evaluation in August 2022 by Dr. Alice Reichert with an EGD and colonoscopy she is currently on Plavix and aspirin .  Dual antiplatelet therapy has been held.  GI on consult.  Patient has relatively stable hemoglobin with no signs of active bleeding.  Tagged red blood cell scan has been discontinued.  GI consulted with tentative plans for endoscopy and colonoscopy.  However patient could not tolerate bowel prep on 11/2.  EGD normal.  Patient could still not tolerate prep.  Remains very nauseous.  Unable to discharge patient at this time.  She cannot tolerate p.o. intake.  She has severe abdominal pain and intractable nausea and vomiting get any p.o. intake attempt.     Assessment & Plan:   Principal Problem:   Rectal bleeding Active Problems:   Hypothyroidism   CKD (chronic kidney disease), stage IIIa   Benign essential hypertension   Carcinoma of overlapping sites of left breast in female, estrogen receptor positive (HCC)   Dizziness   Nausea & vomiting   GIB (gastrointestinal bleeding)   Weakness  Rectal bleeding Intractable nausea and vomiting Dizziness Suspect PUD/lower GI bleed GI on consult Hemoglobin  relatively stable Nausea vomiting persistent Could not tolerate bowel prep Plan: Patient is still not able to tolerate p.o. intake.  Cannot safely discharge at this time.  I was called to bedside on 11/4 as patient had tried to eat a small amount and immediately became very tremulous and had intractable nausea and vomiting associated abdominal pain.  Vital signs remained stable.  No indication for transfer to higher level of care.  Will monitor as inpatient.  Aggressive IV antiemetic regimen.  IV fluids continuous.  Check stat CBC, BMP, lactic acid, KUB.  N.p.o. until studies result  History of TIA Dual antiplatelet therapy held Review of outpatient neurology notes MRI completed in August 2022 demonstrates no subacute or acute infarction.  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  Given the patient has not had coronary artery disease requiring intervention or PCI and has no clear documented evidence of CVA risks of DAPT may outweigh benefits.  At time of discharge I recommend aspirin monotherapy and close follow-up with neurology.  If at a later time outpatient providers feel that dual antiplatelet therapy has benefit we can consider restarting.  Chronic kidney disease stage IIIa Creatinine baseline Avoid nonessential nephrotoxins  Hypothyroidism PTA Synthroid  Hyperlipidemia PTA statin  Essential hypertension Home regimen resumed     DVT prophylaxis: SCDs Code Status: Full Family Communication:Son [name] [phone] on 11/2, at bedside 11/4 Disposition Plan: Status is: Observation  The patient will require care spanning > 2 midnights and should be moved to inpatient because: EGD normal and patient unable to  tolerate prep for colonoscopy.  However she is persistently nauseous, and cannot tolerate p.o. intake.  As result should be moved to inpatient.  Aggressive IV antiemetic regimen, IV fluids, monitoring.  Will discharge once abdominal pain improved and patient able to  tolerate p.o.       Level of care: Med-Surg  Consultants:  GI  Procedures:  None  Antimicrobials: None   Subjective: Seen and examined.  Endorsing abdominal pain and intractable nausea and vomiting  Objective: Vitals:   10/09/21 0737 10/09/21 1116 10/09/21 1227 10/09/21 1352  BP: (!) 171/76 (!) 179/83 (!) 167/79 (!) 153/79  Pulse: 87 79 99 99  Resp: 18   20  Temp: 98.6 F (37 C)   98.8 F (37.1 C)  TempSrc: Oral   Oral  SpO2: 97%   100%  Weight:      Height:        Intake/Output Summary (Last 24 hours) at 10/09/2021 1357 Last data filed at 10/09/2021 0640 Gross per 24 hour  Intake 480 ml  Output 1 ml  Net 479 ml   Filed Weights   10/06/21 1240  Weight: 78 kg    Examination:  General exam: Mild distress.  Nauseous.  Abdominal pain Respiratory system: Tachypneic.  Normal work of breathing.  Room air Cardiovascular system: Tachycardic, regular rhythm, no murmurs Gastrointestinal system: Soft, nondistended, TTP epigastrium, normal bowel sounds Central nervous system: Alert and oriented. No focal neurological deficits. Extremities: Symmetric 5 x 5 power. Skin: No rashes, lesions or ulcers Psychiatry: Judgement and insight appear normal. Mood & affect appropriate.     Data Reviewed: I have personally reviewed following labs and imaging studies  CBC: Recent Labs  Lab 10/06/21 1535 10/06/21 2047 10/07/21 0706 10/07/21 1644 10/08/21 0730  WBC 9.1  --  7.3 11.8* 9.7  NEUTROABS  --   --   --   --  6.7  HGB 10.8* 10.4* 9.6* 10.9* 10.6*  HCT 31.2* 30.8* 29.4* 33.9* 33.0*  MCV 86.9  --  87.0 88.5 88.2  PLT 214  --  198 228 973   Basic Metabolic Panel: Recent Labs  Lab 10/06/21 1535 10/07/21 0706 10/09/21 0854  NA 137 137 137  K 3.3* 3.4* 3.3*  CL 102 103 102  CO2 24 28 27   GLUCOSE 71 95 92  BUN 9 11 7*  CREATININE 0.96 1.14* 1.01*  CALCIUM 9.2 8.7* 8.9  MG  --  2.1  --    GFR: Estimated Creatinine Clearance: 49.2 mL/min (A) (by C-G  formula based on SCr of 1.01 mg/dL (H)). Liver Function Tests: Recent Labs  Lab 10/06/21 1535  AST 29  ALT 16  ALKPHOS 50  BILITOT 0.7  PROT 7.7  ALBUMIN 4.0   No results for input(s): LIPASE, AMYLASE in the last 168 hours. No results for input(s): AMMONIA in the last 168 hours. Coagulation Profile: Recent Labs  Lab 10/06/21 1535  INR 1.3*   Cardiac Enzymes: No results for input(s): CKTOTAL, CKMB, CKMBINDEX, TROPONINI in the last 168 hours. BNP (last 3 results) No results for input(s): PROBNP in the last 8760 hours. HbA1C: No results for input(s): HGBA1C in the last 72 hours. CBG: No results for input(s): GLUCAP in the last 168 hours. Lipid Profile: No results for input(s): CHOL, HDL, LDLCALC, TRIG, CHOLHDL, LDLDIRECT in the last 72 hours. Thyroid Function Tests: Recent Labs    10/06/21 2226  TSH 4.152  FREET4 1.20*   Anemia Panel: Recent Labs    10/07/21 0706  VITAMINB12 588  FERRITIN 26   Sepsis Labs: No results for input(s): PROCALCITON, LATICACIDVEN in the last 168 hours.  Recent Results (from the past 240 hour(s))  Resp Panel by RT-PCR (Flu A&B, Covid) Nasopharyngeal Swab     Status: None   Collection Time: 10/06/21  3:25 PM   Specimen: Nasopharyngeal Swab; Nasopharyngeal(NP) swabs in vial transport medium  Result Value Ref Range Status   SARS Coronavirus 2 by RT PCR NEGATIVE NEGATIVE Final    Comment: (NOTE) SARS-CoV-2 target nucleic acids are NOT DETECTED.  The SARS-CoV-2 RNA is generally detectable in upper respiratory specimens during the acute phase of infection. The lowest concentration of SARS-CoV-2 viral copies this assay can detect is 138 copies/mL. A negative result does not preclude SARS-Cov-2 infection and should not be used as the sole basis for treatment or other patient management decisions. A negative result may occur with  improper specimen collection/handling, submission of specimen other than nasopharyngeal swab, presence of viral  mutation(s) within the areas targeted by this assay, and inadequate number of viral copies(<138 copies/mL). A negative result must be combined with clinical observations, patient history, and epidemiological information. The expected result is Negative.  Fact Sheet for Patients:  EntrepreneurPulse.com.au  Fact Sheet for Healthcare Providers:  IncredibleEmployment.be  This test is no t yet approved or cleared by the [place] FDA and  has been authorized for detection and/or diagnosis of SARS-CoV-2 by FDA under an Emergency Use Authorization (EUA). This EUA will remain  in effect (meaning this test can be used) for the duration of the COVID-19 declaration under Section 564(b)(1) of the Act, 21 U.S.C.section 360bbb-3(b)(1), unless the authorization is terminated  or revoked sooner.       Influenza A by PCR NEGATIVE NEGATIVE Final   Influenza B by PCR NEGATIVE NEGATIVE Final    Comment: (NOTE) The Xpert Xpress SARS-CoV-2/FLU/RSV plus assay is intended as an aid in the diagnosis of influenza from Nasopharyngeal swab specimens and should not be used as a sole basis for treatment. Nasal washings and aspirates are unacceptable for Xpert Xpress SARS-CoV-2/FLU/RSV testing.  Fact Sheet for Patients: EntrepreneurPulse.com.au  Fact Sheet for Healthcare Providers: IncredibleEmployment.be  This test is not yet approved or cleared by the [place] FDA and has been authorized for detection and/or diagnosis of SARS-CoV-2 by FDA under an Emergency Use Authorization (EUA). This EUA will remain in effect (meaning this test can be used) for the duration of the COVID-19 declaration under Section 564(b)(1) of the Act, 21 U.S.C. section 360bbb-3(b)(1), unless the authorization is terminated or revoked.  Performed at [facility], [address] [place], [place] [address]          Radiology  Studies: No results found.      Scheduled Meds:  atorvastatin  80 mg Oral Daily   diltiazem  120 mg Oral Daily   FLUoxetine  10 mg Oral Daily   gabapentin  300 mg Oral BID   hydrALAZINE  25 mg Oral TID   levothyroxine  75 mcg Oral q morning   metoprolol succinate  50 mg Oral BID   pantoprazole  40 mg Oral BID   Continuous Infusions:  sodium chloride     iron sucrose     promethazine (PHENERGAN) injection (IM or IVPB) Stopped (10/07/21 2233)     LOS: 1 day    Time spent: 25 minutes    Sidney Ace, MD [facility]   If 7PM-7AM, please contact night-coverage  10/09/2021, 1:57 PM

## 2021-10-09 NOTE — Progress Notes (Signed)
Cephas Darby, MD [address]  [place]  [place], [place] [address]  Main: [phone]  Fax: [phone] Pager: [phone]   Subjective: No acute events overnight.  Patient is having brown bowel movements.  She could tolerate only half the amount of prep.  Having dark brown bowel movements with solid stool.  Hemoglobin is stable   Objective: Vital signs in last 24 hours: Vitals:   10/09/21 0643 10/09/21 0737 10/09/21 1116 10/09/21 1227  BP: (!) 161/69 (!) 171/76 (!) 179/83 (!) 167/79  Pulse: 81 87 79 99  Resp: 19 18    Temp: 98.8 F (37.1 C) 98.6 F (37 C)    TempSrc: Oral Oral    SpO2:  97%    Weight:      Height:       Weight change:   Intake/Output Summary (Last 24 hours) at 10/09/2021 1332 Last data filed at 10/09/2021 0640 Gross per 24 hour  Intake 480 ml  Output 1 ml  Net 479 ml     Exam: Heart:: Regular rate and rhythm, S1S2 present, or without murmur or extra heart sounds Lungs: normal and clear to auscultation Abdomen: soft, nontender, normal bowel sounds   Lab Results: CBC Latest Ref Rng & Units 10/08/2021 10/07/2021 10/07/2021  WBC 4.0 - 10.5 K/uL 9.7 11.8(H) 7.3  Hemoglobin 12.0 - 15.0 g/dL 10.6(L) 10.9(L) 9.6(L)  Hematocrit 36.0 - 46.0 % 33.0(L) 33.9(L) 29.4(L)  Platelets 150 - 400 K/uL 217 228 198   CMP Latest Ref Rng & Units 10/09/2021 10/07/2021 10/06/2021  Glucose 70 - 99 mg/dL 92 95 71  BUN 8 - 23 mg/dL 7(L) 11 9  Creatinine 0.44 - 1.00 mg/dL 1.01(H) 1.14(H) 0.96  Sodium 135 - 145 mmol/L 137 137 137  Potassium 3.5 - 5.1 mmol/L 3.3(L) 3.4(L) 3.3(L)  Chloride 98 - 111 mmol/L 102 103 102  CO2 22 - 32 mmol/L 27 28 24   Calcium 8.9 - 10.3 mg/dL 8.9 8.7(L) 9.2  Total Protein 6.5 - 8.1 g/dL - - 7.7  Total Bilirubin 0.3 - 1.2 mg/dL - - 0.7  Alkaline Phos 38 - 126 U/L - - 50  AST 15 - 41 U/L - - 29  ALT 0 - 44 U/L - - 16    Micro Results: Recent Results (from the past 240 hour(s))  Resp Panel by RT-PCR (Flu A&B, Covid)  Nasopharyngeal Swab     Status: None   Collection Time: 10/06/21  3:25 PM   Specimen: Nasopharyngeal Swab; Nasopharyngeal(NP) swabs in vial transport medium  Result Value Ref Range Status   SARS Coronavirus 2 by RT PCR NEGATIVE NEGATIVE Final    Comment: (NOTE) SARS-CoV-2 target nucleic acids are NOT DETECTED.  The SARS-CoV-2 RNA is generally detectable in upper respiratory specimens during the acute phase of infection. The lowest concentration of SARS-CoV-2 viral copies this assay can detect is 138 copies/mL. A negative result does not preclude SARS-Cov-2 infection and should not be used as the sole basis for treatment or other patient management decisions. A negative result may occur with  improper specimen collection/handling, submission of specimen other than nasopharyngeal swab, presence of viral mutation(s) within the areas targeted by this assay, and inadequate number of viral copies(<138 copies/mL). A negative result must be combined with clinical observations, patient history, and epidemiological information. The expected result is Negative.  Fact Sheet for Patients:  EntrepreneurPulse.com.au  Fact Sheet for Healthcare Providers:  IncredibleEmployment.be  This test is no t yet approved or cleared by the  [place] Architectural technologist and  has been authorized for detection and/or diagnosis of SARS-CoV-2 by FDA under an Print production planner (EUA). This EUA will remain  in effect (meaning this test can be used) for the duration of the COVID-19 declaration under Section 564(b)(1) of the Act, 21 U.S.C.section 360bbb-3(b)(1), unless the authorization is terminated  or revoked sooner.       Influenza A by PCR NEGATIVE NEGATIVE Final   Influenza B by PCR NEGATIVE NEGATIVE Final    Comment: (NOTE) The Xpert Xpress SARS-CoV-2/FLU/RSV plus assay is intended as an aid in the diagnosis of influenza from Nasopharyngeal swab specimens and should not be  used as a sole basis for treatment. Nasal washings and aspirates are unacceptable for Xpert Xpress SARS-CoV-2/FLU/RSV testing.  Fact Sheet for Patients: EntrepreneurPulse.com.au  Fact Sheet for Healthcare Providers: IncredibleEmployment.be  This test is not yet approved or cleared by the [place] FDA and has been authorized for detection and/or diagnosis of SARS-CoV-2 by FDA under an Emergency Use Authorization (EUA). This EUA will remain in effect (meaning this test can be used) for the duration of the COVID-19 declaration under Section 564(b)(1) of the Act, 21 U.S.C. section 360bbb-3(b)(1), unless the authorization is terminated or revoked.  Performed at [facility], [address], [place], [place] [address]    Studies/Results: No results found. Medications: I have reviewed the patient's current medications. Prior to Admission:  Medications Prior to Admission  Medication Sig Dispense Refill Last Dose   diltiazem (CARDIZEM CD) 120 MG 24 hr capsule Take 120 mg by mouth daily.   10/06/2021   levothyroxine (SYNTHROID) 75 MCG tablet Take 75 mcg by mouth every morning.   10/06/2021   acetaminophen (TYLENOL) 500 MG tablet Take 500-1,000 mg by mouth as needed for pain.   prn at prn   aspirin 81 MG EC tablet Take 81 mg by mouth daily.   10/05/2021   atorvastatin (LIPITOR) 80 MG tablet Take 80 mg by mouth daily.   10/05/2021   clopidogrel (PLAVIX) 75 MG tablet Take 75 mg by mouth daily.   10/05/2021   Cyanocobalamin (VITAMIN B-12 PO) Take 1 tablet by mouth daily.   10/05/2021   fexofenadine (ALLEGRA) 180 MG tablet Take 180 mg by mouth as needed for allergies.   prn at prn   FLUoxetine (PROZAC) 10 MG capsule Take 10 mg by mouth daily.   10/05/2021   hydrALAZINE (APRESOLINE) 25 MG tablet Take 25 mg by mouth 3 (three) times daily.   10/05/2021   LORazepam (ATIVAN) 0.5 MG tablet Take 0.25-0.5 mg by mouth every 12 (twelve) hours as needed for  anxiety. (Patient not taking: Reported on 10/06/2021)      magnesium oxide (MAG-OX) 400 MG tablet Take 400 mg by mouth daily.   10/05/2021   meclizine (ANTIVERT) 12.5 MG tablet Take 12.5 mg by mouth as needed for dizziness. (Patient not taking: Reported on 10/06/2021)      metoprolol succinate (TOPROL-XL) 50 MG 24 hr tablet Take 50 mg by mouth 2 (two) times daily.   10/05/2021   Multiple Vitamins-Minerals (THERA-M) TABS Take 1 tablet by mouth daily.   10/05/2021   ondansetron (ZOFRAN ODT) 4 MG disintegrating tablet Take 1 tablet (4 mg total) by mouth every 8 (eight) hours as needed for nausea or vomiting. (Patient not taking: Reported on 10/06/2021) 20 tablet 0    pantoprazole (PROTONIX) 40 MG tablet Take 40 mg by mouth daily.   10/05/2021   potassium chloride SA (KLOR-CON) 20 MEQ  tablet Take 20 mEq by mouth daily.   10/05/2021   telmisartan (MICARDIS) 80 MG tablet Take 80 mg by mouth daily.   10/05/2021   Scheduled:  atorvastatin  80 mg Oral Daily   diltiazem  120 mg Oral Daily   FLUoxetine  10 mg Oral Daily   gabapentin  300 mg Oral BID   hydrALAZINE  25 mg Oral TID   levothyroxine  75 mcg Oral q morning   metoprolol succinate  50 mg Oral BID   pantoprazole  40 mg Oral BID   Continuous:  sodium chloride     promethazine (PHENERGAN) injection (IM or IVPB) Stopped (10/07/21 2233)   PRN:[DISCONTINUED] acetaminophen **OR** acetaminophen, acetaminophen-codeine, hydrALAZINE, LORazepam, ondansetron **OR** ondansetron (ZOFRAN) IV, promethazine (PHENERGAN) injection (IM or IVPB) Anti-infectives (From admission, onward)    None      Scheduled Meds:  atorvastatin  80 mg Oral Daily   diltiazem  120 mg Oral Daily   FLUoxetine  10 mg Oral Daily   gabapentin  300 mg Oral BID   hydrALAZINE  25 mg Oral TID   levothyroxine  75 mcg Oral q morning   metoprolol succinate  50 mg Oral BID   pantoprazole  40 mg Oral BID   Continuous Infusions:  sodium chloride     promethazine (PHENERGAN) injection  (IM or IVPB) Stopped (10/07/21 2233)   PRN Meds:.[DISCONTINUED] acetaminophen **OR** acetaminophen, acetaminophen-codeine, hydrALAZINE, LORazepam, ondansetron **OR** ondansetron (ZOFRAN) IV, promethazine (PHENERGAN) injection (IM or IVPB)   Assessment: Principal Problem:   Rectal bleeding Active Problems:   Hypothyroidism   CKD (chronic kidney disease), stage IIIa   Benign essential hypertension   Carcinoma of overlapping sites of left breast in female, estrogen receptor positive (HCC)   Dizziness   Nausea & vomiting   GIB (gastrointestinal bleeding)   Weakness  Painless rectal bleeding, self-limited, hemoglobin is stable S/p EGD to evaluate for nausea and vomiting on 10/08/2021 unremarkable - Normal esophagus. - Normal stomach. - Normal examined duodenum. - No specimens collected.  Plan: Rectal bleeding, hemoglobin is stable Patient did not tolerate bowel prep on 11/2 and had only half the amount of prep 11/3 and her bowel movements are still dark liquid brown and some solid component Patient had a colonoscopy in 2019, found to have sigmoid diverticulosis and small polyps were removed Since patient is not having active bleeding at this time, patient can be discharged home and follow-up with GI as outpatient and discuss about repeat colonoscopy as outpatient Okay to resume aspirin and Plavix Avoid constipation, recommend regular bowel regimen  GI will sign off at this time, please call us back with questions or concerns   LOS: 1 day   Johnatha Zeidman 10/09/2021, 1:32 PM

## 2021-10-09 NOTE — Progress Notes (Signed)
Patient NPO since midnight. Finished about half of bowel prep solution. Did not tolerate well, vomited several times, small amounts. Stools are not completely clear yet, still light brown.

## 2021-10-09 NOTE — Evaluation (Signed)
Occupational Therapy Evaluation Patient Details Name: Cindy Huerta MRN: [id number] DOB: January 19, 1946 Today's Date: 10/09/2021   History of Present Illness Pt is a 75 y/o F admitted on 10/06/21 after presenting with c/o bright red blood per rectum for the past few days as well as an emesis episode. PMH: breast CA, depression, HLD, HTN, hypothyrodism   Clinical Impression   Pt greeted in bed, agreeable to tx session. Co-tx completed with PT. Pt alert and oriented, eager to wash up. Supine>sit with SUP, ambulate around bed with MIN A +2 due to pt reported weakness in B legs, frequently reaching for IV pole/wall for stabilization. Pt with BM requiring MAX A for peri care, RN then reporting Pt HR up to 166, not resolved with seated rest break.  Pt returned to bed with MIN A +2, left in care of RN. Pt HR 110 in supine, c/o "stabbing" pain in forehead- RN present and aware. OT recommends discharge home with [place]. OT will continue to follow while admitted.      Recommendations for follow up therapy are one component of a multi-disciplinary discharge planning process, led by the attending physician.  Recommendations may be updated based on patient status, additional functional criteria and insurance authorization.   Follow Up Recommendations  Home health OT    Assistance Recommended at Discharge Intermittent Supervision/Assistance  Functional Status Assessment  Patient has had a recent decline in their functional status and demonstrates the ability to make significant improvements in function in a reasonable and predictable amount of time.  Equipment Recommendations  BSC;Tub/shower seat    Recommendations for Other Services       Precautions / Restrictions Precautions Precautions: Fall Restrictions Weight Bearing Restrictions: No      Mobility Bed Mobility Overal bed mobility: Needs Assistance Bed Mobility: Supine to Sit;Sit to Supine     Supine to sit: Supervision;HOB elevated Sit to  supine: +2 for safety/equipment     Patient Response: Anxious  Transfers Overall transfer level: Needs assistance Equipment used: None Transfers: Sit to/from Stand Sit to Stand: Min assist                  Balance Overall balance assessment: Needs assistance Sitting-balance support: Feet supported Sitting balance-Leahy Scale: Fair Sitting balance - Comments: close sup static sitting   Standing balance support: Bilateral upper extremity supported;During functional activity Standing balance-Leahy Scale: Poor Standing balance comment: BUE & min<>mod assist +2                           ADL either performed or assessed with clinical judgement   ADL Overall ADL's : Needs assistance/impaired     Grooming: Wash/dry hands;Supervision/safety                   Toilet Transfer: Minimal assistance;+2 for safety/equipment   Toileting- Clothing Manipulation and Hygiene: Maximal assistance;+2 for physical assistance       Functional mobility during ADLs: Minimal assistance;+2 for safety/equipment;+2 for physical assistance        Pertinent Vitals/Pain Pain Assessment: Faces Faces Pain Scale: Hurts worst Pain Location: R/frontal HA at end of session Pain Descriptors / Indicators: Stabbing Pain Intervention(s): Monitored during session;Repositioned;Other (comment) (RN present and aware)     Hand Dominance     Extremity/Trunk Assessment Upper Extremity Assessment Upper Extremity Assessment: Generalized weakness   Lower Extremity Assessment Lower Extremity Assessment: Generalized weakness       Communication Communication Communication:  (Pt  with expressive difficulties (son reports pt is anxious re: sitaution))   Cognition Arousal/Alertness: Awake/alert Behavior During Therapy: WFL for tasks assessed/performed;Anxious Overall Cognitive Status: Within Functional Limits for tasks assessed                                 General  Comments: alert and oriented to self, date, place, situation; anxious throughout with therapeutic listening provided throughout                [place] expects to be discharged to:: Private residence Living Arrangements: Children Available Help at Discharge: Family;Available 24 hours/day Type of Home: House Home Access: Stairs to enter [employer] of Steps: 1 Entrance Stairs-Rails: None Home Layout: Bed/bath upstairs;Two level Alternate Level Stairs-Number of Steps: 1/2 flight + landing + 1/2 flight Alternate Level Stairs-Rails: Left;Right           Home Equipment: Conservation officer, nature (2 wheels);Cane - quad   Additional Comments: son works from home, available to assist as needed      Prior Functioning/Environment Prior Level of Function : Independent/Modified Independent             Mobility Comments: Pt & son report pt was independent without AD prior to admisison ADLs Comments: Independent in ADL/IADL PTA        OT Problem List: Decreased strength;Impaired balance (sitting and/or standing);Decreased safety awareness;Decreased activity tolerance;Decreased knowledge of use of DME or AE      OT Treatment/Interventions: Self-care/ADL training;DME and/or AE instruction;Therapeutic activities;Therapeutic exercise;Energy conservation;Patient/family education    OT Goals(Current goals can be found in the care plan section) Acute Rehab OT Goals Patient Stated Goal: to feel better OT Goal Formulation: With patient Time For Goal Achievement: 10/23/21 Potential to Achieve Goals: Good ADL Goals Pt Will Perform Grooming: standing;with modified independence Pt Will Perform Lower Body Dressing: with modified independence;sit to/from stand Pt Will Transfer to Toilet: with modified independence Pt Will Perform Toileting - Clothing Manipulation and hygiene: with modified independence;sit to/from stand  OT Frequency: Min 2X/week            Co-evaluation PT/OT/SLP Co-Evaluation/Treatment: Yes Reason for Co-Treatment: For patient/therapist safety;To address functional/ADL transfers PT goals addressed during session: Mobility/safety with mobility;Balance OT goals addressed during session: ADL's and self-care;Proper use of Adaptive equipment and DME      AM-PAC OT "6 Clicks" Daily Activity     Outcome Measure Help from another person eating meals?: None Help from another person taking care of personal grooming?: A Little Help from another person toileting, which includes using toliet, bedpan, or urinal?: A Lot Help from another person bathing (including washing, rinsing, drying)?: A Little Help from another person to put on and taking off regular upper body clothing?: A Little Help from another person to put on and taking off regular lower body clothing?: A Lot 6 Click Score: 17   End of Session Nurse Communication: Mobility status;Other (comment) (RN present in room due to vital signs)  Activity Tolerance: Treatment limited secondary to medical complications (Comment) (HR up to 166, did not resolve with sitting rest break; HR to 110 when laying flat) Patient left:    OT Visit Diagnosis: Unsteadiness on feet (R26.81);Muscle weakness (generalized) (M62.81)                Time: 1442-1500 OT Time Calculation (min): 18 min Charges:  OT General Charges $OT Visit: 1 Visit OT Evaluation $OT Eval Moderate Complexity:  [place], [place] OTR/L  10/09/21, 4:27 PM

## 2021-10-10 DIAGNOSIS — K625 Hemorrhage of anus and rectum: Secondary | ICD-10-CM | POA: Diagnosis not present

## 2021-10-10 LAB — CBC WITH DIFFERENTIAL/PLATELET
Abs Immature Granulocytes: 0.03 10*3/uL (ref 0.00–0.07)
Basophils Absolute: 0 10*3/uL (ref 0.0–0.1)
Basophils Relative: 0 %
Eosinophils Absolute: 0.3 10*3/uL (ref 0.0–0.5)
Eosinophils Relative: 3 %
HCT: 31.7 % — ABNORMAL LOW (ref 36.0–46.0)
Hemoglobin: 10.3 g/dL — ABNORMAL LOW (ref 12.0–15.0)
Immature Granulocytes: 0 %
Lymphocytes Relative: 6 %
Lymphs Abs: 0.6 10*3/uL — ABNORMAL LOW (ref 0.7–4.0)
MCH: 29.1 pg (ref 26.0–34.0)
MCHC: 32.5 g/dL (ref 30.0–36.0)
MCV: 89.5 fL (ref 80.0–100.0)
Monocytes Absolute: 0.7 10*3/uL (ref 0.1–1.0)
Monocytes Relative: 7 %
Neutro Abs: 8.8 10*3/uL — ABNORMAL HIGH (ref 1.7–7.7)
Neutrophils Relative %: 84 %
Platelets: 202 10*3/uL (ref 150–400)
RBC: 3.54 MIL/uL — ABNORMAL LOW (ref 3.87–5.11)
RDW: 14.2 % (ref 11.5–15.5)
WBC: 10.5 10*3/uL (ref 4.0–10.5)
nRBC: 0 % (ref 0.0–0.2)

## 2021-10-10 LAB — BASIC METABOLIC PANEL
Anion gap: 7 (ref 5–15)
BUN: <5 mg/dL — ABNORMAL LOW (ref 8–23)
CO2: 25 mmol/L (ref 22–32)
Calcium: 8.7 mg/dL — ABNORMAL LOW (ref 8.9–10.3)
Chloride: 104 mmol/L (ref 98–111)
Creatinine, Ser: 0.92 mg/dL (ref 0.44–1.00)
GFR, Estimated: >60 mL/min (ref >60)
Glucose, Bld: 105 mg/dL — ABNORMAL HIGH (ref 70–99)
Potassium: 3.6 mmol/L (ref 3.5–5.1)
Sodium: 136 mmol/L (ref 135–145)

## 2021-10-10 LAB — LACTIC ACID, PLASMA: Lactic Acid, Venous: 0.8 mmol/L (ref 0.5–1.9)

## 2021-10-10 MED ORDER — ONDANSETRON 4 MG PO TBDP: 4.0000 mg | ORAL_TABLET | Freq: Three times a day (TID) | ORAL | 0 refills | Status: DC | PRN

## 2021-10-10 MED ORDER — PANTOPRAZOLE SODIUM 40 MG PO TBEC: 40.0000 mg | DELAYED_RELEASE_TABLET | Freq: Every day | ORAL | 1 refills | Status: AC

## 2021-10-10 NOTE — Discharge Summary (Signed)
Physician Discharge Summary  Cindy Huerta MBE:[id number] DOB: 06/20/1946 DOA: 10/06/2021  PCP: Juluis Pitch, MD  Admit date: 10/06/2021 Discharge date: 10/10/2021  Admitted From: Home Disposition: Home with home health  Recommendations for Outpatient Follow-up:  Follow up with PCP in 1-2 weeks Follow-up with neurology Follow-up with gastroenterology  Home Health: Yes, PT OT Equipment/Devices: None  Discharge Condition: Stable CODE STATUS: DNR Diet recommendation: Soft  Brief/Interim Summary: 75 y.o. female seen in ed with complaints of bright red blood per rectum for the past few days and bloody bowel movement at her oncology clinic appointment today for her diagnosis of breast cancer.  During her appointment while in dressing patient started vomiting uncontrollably and had urged for bowel movement patient in the bathroom assisted by 2 RNs was lightheaded and dizzy and had explosive diarrhea with bright red bleeding and stool.  Vitals while on the commode was a blood pressure of 144/81 and heart rate of 55.  Was also vomiting in the emesis bag patient was symptomatic and dizzy and therefore was sent to the emergency room todayPatient had GI evaluation in August 2022 by Dr. Alice Reichert with an EGD and colonoscopy she is currently on Plavix and aspirin .   Dual antiplatelet therapy has been held.  GI on consult.  Patient has relatively stable hemoglobin with no signs of active bleeding.  Tagged red blood cell scan has been discontinued.  GI consulted with tentative plans for endoscopy and colonoscopy.  However patient could not tolerate bowel prep on 11/2.  EGD normal.  Patient could still not tolerate prep.  Remains very nauseous.   Confirmation of p.o. intake at time of discharge.  Patient tolerated a soft meal without nausea vomiting or abdominal pain.  Stable for discharge home at this time.  At time of discharge I have prescribed Zofran as needed.  Increase home Protonix dose to 40 twice  daily.  Lengthy conversation with the patient and 2 sons at bedside on day of discharge.  I recommend that the patient hold her Plavix at time of discharge.  Continue with aspirin monotherapy.  Patient does have evidence of atherosclerotic disease on brain imaging however indication for dual antiplatelet therapy was history of TIA.  At this time I feel the risks of resuming Plavix in the setting of GI bleed outweigh the benefits.  Patient will need to see neurology, PCP, gastroenterology after discharge for further discussion regarding risk-benefit profile of dual antiplatelet therapy.    Discharge Diagnoses:  Principal Problem:   Rectal bleeding Active Problems:   Hypothyroidism   CKD (chronic kidney disease), stage IIIa   Benign essential hypertension   Carcinoma of overlapping sites of left breast in female, estrogen receptor positive (HCC)   Dizziness   Nausea & vomiting   GIB (gastrointestinal bleeding)   Weakness   Intractable nausea and vomiting  Rectal bleeding Intractable nausea and vomiting Dizziness Suspect PUD/lower GI bleed GI on consult Hemoglobin relatively stable Nausea vomiting persistent Could not tolerate bowel prep Plan: Discharge home.  Hemoglobin stable.  Unable to tolerate prep.  Will need outpatient follow-up with GI.  At time of discharge recommend discontinuing Plavix until evaluated by outpatient providers.  Risks of dual antiplatelet therapy outweigh the benefits in the setting of history of TIA.  Patient has no evidence of ACS or CAD requiring PCI or true CVA.  Will recommend 60 days of twice daily PPI, as needed Zofran.   History of TIA Dual antiplatelet therapy held Review of outpatient neurology  notes MRI completed in August 2022 demonstrates no subacute or acute infarction.  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  Given the patient has not had coronary artery disease requiring intervention or PCI and has no clear documented evidence of CVA  risks of DAPT may outweigh benefits.  At time of discharge I recommend aspirin monotherapy and close follow-up with neurology.  If at a later time outpatient providers feel that dual antiplatelet therapy has benefit we can consider restarting.   Chronic kidney disease stage IIIa Creatinine baseline Avoid nonessential nephrotoxins   Hypothyroidism PTA Synthroid   Hyperlipidemia PTA statin   Essential hypertension Home regimen resumed  Discharge Instructions  Discharge Instructions     Diet - low sodium heart healthy   Complete by: As directed    Increase activity slowly   Complete by: As directed       Allergies as of 10/10/2021       Reactions   Latex Rash   Lisinopril Cough   Procaine Other (See Comments)   Unsure - told by DDS not to let anyone give it to her  Confusion   Diphenhydramine Hcl Other (See Comments)   States she was told to not take - jitteriness and agitation Not sure of reaction   Clarithromycin Other (See Comments)   Confusion   Oxycodone Nausea And Vomiting, Anxiety        Medication List     STOP taking these medications    clopidogrel 75 MG tablet Commonly known as: PLAVIX   LORazepam 0.5 MG tablet Commonly known as: ATIVAN   meclizine 12.5 MG tablet Commonly known as: ANTIVERT       TAKE these medications    acetaminophen 500 MG tablet Commonly known as: TYLENOL Take 500-1,000 mg by mouth as needed for pain.   aspirin 81 MG EC tablet Take 81 mg by mouth daily.   atorvastatin 80 MG tablet Commonly known as: LIPITOR Take 80 mg by mouth daily.   diltiazem 120 MG 24 hr capsule Commonly known as: CARDIZEM CD Take 120 mg by mouth daily.   fexofenadine 180 MG tablet Commonly known as: ALLEGRA Take 180 mg by mouth as needed for allergies.   FLUoxetine 10 MG capsule Commonly known as: PROZAC Take 10 mg by mouth daily.   hydrALAZINE 25 MG tablet Commonly known as: APRESOLINE Take 25 mg by mouth 3 (three) times daily.    levothyroxine 75 MCG tablet Commonly known as: SYNTHROID Take 75 mcg by mouth every morning.   magnesium oxide 400 MG tablet Commonly known as: MAG-OX Take 400 mg by mouth daily.   metoprolol succinate 50 MG 24 hr tablet Commonly known as: TOPROL-XL Take 50 mg by mouth 2 (two) times daily.   ondansetron 4 MG disintegrating tablet Commonly known as: Zofran ODT Take 1 tablet (4 mg total) by mouth every 8 (eight) hours as needed for nausea or vomiting.   pantoprazole 40 MG tablet Commonly known as: PROTONIX Take 1 tablet (40 mg total) by mouth daily.   potassium chloride SA 20 MEQ tablet Commonly known as: KLOR-CON Take 20 mEq by mouth daily.   telmisartan 80 MG tablet Commonly known as: MICARDIS Take 80 mg by mouth daily.   Thera-M Tabs Take 1 tablet by mouth daily.   VITAMIN B-12 PO Take 1 tablet by mouth daily.        Follow-up Information     Juluis Pitch, MD. Schedule an appointment as soon as possible for a visit in 1  week(s).   Specialty: Family Medicine Contact information: [address] [place] [address] [phone]         Jonathon Bellows, MD. Schedule an appointment as soon as possible for a visit in 3 week(s).   Specialty: Gastroenterology Contact information: [place] [address] [phone]                Allergies  Allergen Reactions   Latex Rash   Lisinopril Cough   Procaine Other (See Comments)    Unsure - told by DDS not to let anyone give it to her  Confusion    Diphenhydramine Hcl Other (See Comments)    States she was told to not take - jitteriness and agitation Not sure of reaction    Clarithromycin Other (See Comments)    Confusion     Oxycodone Nausea And Vomiting and Anxiety    Consultations: GI   Procedures/Studies: DG Abd 1 View  Result Date: 10/09/2021 CLINICAL DATA:  75 year old female with bloody diarrhea. EXAM: ABDOMEN - 1 VIEW COMPARISON:  CT abdomen pelvis  from 10/06/2021 FINDINGS: The bowel gas pattern is normal. No radio-opaque calculi or other significant radiographic abnormality are seen. Cholecystectomy clips in right upper quadrant. IMPRESSION: Nonobstructive bowel gas pattern. Electronically Signed   By: Ruthann Cancer M.D.   On: 10/09/2021 14:17   CT Head Wo Contrast  Result Date: 10/06/2021 CLINICAL DATA:  A 75 year old female with new diagnosis of breast cancer is presents with sudden onset of vomiting and disorientation. EXAM: CT HEAD WITHOUT CONTRAST TECHNIQUE: Contiguous axial images were obtained from the base of the skull through the vertex without intravenous contrast. COMPARISON:  July 29, 2021, CT and MRI of the brain. FINDINGS: Brain: No evidence of acute infarction, hemorrhage, hydrocephalus, extra-axial collection or mass lesion/mass effect. Signs of atrophy and chronic microvascular ischemic change. Findings unchanged compared to previous imaging Vascular: No hyperdense vessel or unexpected calcification. Skull: Normal. Negative for fracture or focal lesion. Sinuses/Orbits: Visualized paranasal sinuses and orbits are unremarkable. Other: None IMPRESSION: No acute intracranial abnormality. Signs of atrophy and chronic microvascular ischemic change. Electronically Signed   By: Zetta Bills M.D.   On: 10/06/2021 13:14   CT ABDOMEN PELVIS W CONTRAST  Result Date: 10/06/2021 CLINICAL DATA:  Lower GI bleed, abdominal pain, history hypertension, newly diagnosed breast cancer EXAM: CT ABDOMEN AND PELVIS WITH CONTRAST TECHNIQUE: Multidetector CT imaging of the abdomen and pelvis was performed using the standard protocol following bolus administration of intravenous contrast. CONTRAST:  187mL OMNIPAQUE IOHEXOL 300 MG/ML  SOLN COMPARISON:  04/01/2021 FINDINGS: Lower chest: Lung bases clear Hepatobiliary: Gallbladder surgically absent. Cyst lateral segment LEFT lobe liver 7 mm diameter previously 6 mm. Remainder of liver normal appearance.  Pancreas: Normal appearance Spleen: Normal appearance Adrenals/Urinary Tract: Adrenal glands, kidneys, ureters, and bladder normal appearance Stomach/Bowel: Appendix not identified. Ingested radiopacity within cecum. Scattered colonic diverticulosis without evidence of diverticulitis. Stomach and remaining bowel loops unremarkable. Vascular/Lymphatic: Atherosclerotic calcifications aorta and iliac arteries without aneurysm. No adenopathy. Reproductive: Uterus surgically absent with nonvisualization of ovaries Other: No free air or free fluid. No hernia or inflammatory process. Musculoskeletal: Diffuse osseous demineralization. IMPRESSION: Colonic diverticulosis without evidence of diverticulitis. No acute intra-abdominal or intrapelvic abnormalities. Aortic Atherosclerosis (ICD10-I70.0). Electronically Signed   By: Lavonia Dana M.D.   On: 10/06/2021 18:06   US BREAST LTD UNI LEFT INC AXILLA  Result Date: 09/11/2021 CLINICAL DATA:  Mass felt by the patient in the outer left breast  for the past 3 months at the site of a previous excisional biopsy performed 2019. EXAM: DIGITAL DIAGNOSTIC BILATERAL MAMMOGRAM WITH TOMOSYNTHESIS AND CAD; ULTRASOUND LEFT BREAST LIMITED TECHNIQUE: Bilateral digital diagnostic mammography and breast tomosynthesis was performed. The images were evaluated with computer-aided detection.; Targeted ultrasound examination of the left breast was performed. COMPARISON:  Previous exam(s). ACR Breast Density Category b: There are scattered areas of fibroglandular density. FINDINGS: Since the patient's most recent examination dated 10/11/2019 at [place], there has been interval development of multiple oval, rounded and lobulated masses in the posterior outer left breast at the location of multiple surgical clips. Some of these have relatively low density suggesting oil cysts. Others are higher in density. The largest measures 3 cm in maximum diameter. This is at the location of the palpable  mass, marked with a metallic marker. The masses span an area measuring 7.9 x 5.3 x 2.8 cm. No abnormal appearing left axillary lymph nodes are visualized. The right breast continues to have a normal appearance. On physical exam, the patient has an approximately 2.5 cm oval, mildly protuberant palpable mass in the 3 o'clock position of the left breast, 13 cm from the nipple. Targeted ultrasound is performed, showing a 2.6 x 1.8 x 1.3 cm oval, horizontally oriented, circumscribed mass in the 3 o'clock position of the left breast, 13 cm from the nipple. This has cystic and solid appearing components. The solid appearing component has an irregular mass-like appearance with internal blood flow with power Doppler. This component measures 1.5 x 1.2 x 0.7 cm in maximum dimensions. There is an adjacent, bilobed, medium echotexture mass slightly more laterally. This mass measures 1.1 x 1.1 x 0.6 cm. No internal blood flow was seen with power Doppler. Multiple additional smaller, rounded, predominantly cystic masses are demonstrated, some with mild diffuse wall thickening. Ultrasound of the left axilla demonstrated normal appearing left axillary lymph nodes IMPRESSION: 1. Interval multiple masses in the posterior outer left breast at the location of previous surgery, as described above. These could all represent areas of fat necrosis and oil cyst formation. However, 2 of these are indeterminate including a 2.6 cm cystic and solid mass in 3 o'clock position with ultrasound findings suspicious for the possibility of a papillary lesion and a 1.1 cm adjacent bilobed mass more laterally which could be solid. 2. No evidence of malignancy elsewhere in either breast and no left axillary adenopathy. RECOMMENDATION: Ultrasound-guided core needle biopsies of the 2.6 cm and 1.1 cm masses in the 3 o'clock position of the left breast. This has been discussed with the patient and her son. She will be assisted in getting these scheduled. I  have discussed the findings and recommendations with the patient. If applicable, a reminder letter will be sent to the patient regarding the next appointment. BI-RADS CATEGORY  4: Suspicious. Electronically Signed   By: Claudie Revering M.D.   On: 09/11/2021 12:39  MM DIAG BREAST TOMO BILATERAL  Result Date: 09/11/2021 CLINICAL DATA:  Mass felt by the patient in the outer left breast for the past 3 months at the site of a previous excisional biopsy performed 2019. EXAM: DIGITAL DIAGNOSTIC BILATERAL MAMMOGRAM WITH TOMOSYNTHESIS AND CAD; ULTRASOUND LEFT BREAST LIMITED TECHNIQUE: Bilateral digital diagnostic mammography and breast tomosynthesis was performed. The images were evaluated with computer-aided detection.; Targeted ultrasound examination of the left breast was performed. COMPARISON:  Previous exam(s). ACR Breast Density Category b: There are scattered areas of fibroglandular density. FINDINGS: Since the patient's most recent examination dated  10/11/2019 at [facility], there has been interval development of multiple oval, rounded and lobulated masses in the posterior outer left breast at the location of multiple surgical clips. Some of these have relatively low density suggesting oil cysts. Others are higher in density. The largest measures 3 cm in maximum diameter. This is at the location of the palpable mass, marked with a metallic marker. The masses span an area measuring 7.9 x 5.3 x 2.8 cm. No abnormal appearing left axillary lymph nodes are visualized. The right breast continues to have a normal appearance. On physical exam, the patient has an approximately 2.5 cm oval, mildly protuberant palpable mass in the 3 o'clock position of the left breast, 13 cm from the nipple. Targeted ultrasound is performed, showing a 2.6 x 1.8 x 1.3 cm oval, horizontally oriented, circumscribed mass in the 3 o'clock position of the left breast, 13 cm from the nipple. This has cystic and solid appearing components. The  solid appearing component has an irregular mass-like appearance with internal blood flow with power Doppler. This component measures 1.5 x 1.2 x 0.7 cm in maximum dimensions. There is an adjacent, bilobed, medium echotexture mass slightly more laterally. This mass measures 1.1 x 1.1 x 0.6 cm. No internal blood flow was seen with power Doppler. Multiple additional smaller, rounded, predominantly cystic masses are demonstrated, some with mild diffuse wall thickening. Ultrasound of the left axilla demonstrated normal appearing left axillary lymph nodes IMPRESSION: 1. Interval multiple masses in the posterior outer left breast at the location of previous surgery, as described above. These could all represent areas of fat necrosis and oil cyst formation. However, 2 of these are indeterminate including a 2.6 cm cystic and solid mass in 3 o'clock position with ultrasound findings suspicious for the possibility of a papillary lesion and a 1.1 cm adjacent bilobed mass more laterally which could be solid. 2. No evidence of malignancy elsewhere in either breast and no left axillary adenopathy. RECOMMENDATION: Ultrasound-guided core needle biopsies of the 2.6 cm and 1.1 cm masses in the 3 o'clock position of the left breast. This has been discussed with the patient and her son. She will be assisted in getting these scheduled. I have discussed the findings and recommendations with the patient. If applicable, a reminder letter will be sent to the patient regarding the next appointment. BI-RADS CATEGORY  4: Suspicious. Electronically Signed   By: Claudie Revering M.D.   On: 09/11/2021 12:39  MM CLIP PLACEMENT LEFT  Result Date: 09/23/2021 CLINICAL DATA:  Status post 2 site ultrasound-guided biopsy EXAM: 3D DIAGNOSTIC LEFT MAMMOGRAM POST ULTRASOUND BIOPSY COMPARISON:  Previous exam(s). FINDINGS: Site 1: 3D Mammographic images were obtained following ultrasound guided biopsy of a isoechoic mass at 3 o'clock 14 cm from the nipple.  The Heart shaped biopsy marking clip is in expected position at the site of biopsy. Site 2: 3D Mammographic images were obtained following ultrasound guided biopsy of a complex cystic and solid mass at 3 o'clock 13 cm from the nipple. The VENUS shaped biopsy marking clip is in expected position at the site of biopsy. This is at the site of dominant mass noted within the lumpectomy bed IMPRESSION: 1. Appropriate positioning of the HEART shaped biopsy marking clip at the site of biopsy in the outer breast. 2. Appropriate positioning of the VENUS shaped biopsy marking clip at the site of biopsy in the outer breast. Final Assessment: Post Procedure Mammograms for Marker Placement Electronically Signed   By: Valentino Saxon M.D.  On: 09/23/2021 09:17  [place] LT BREAST BX W LOC DEV 1ST LESION IMG BX SPEC US GUIDE  Addendum Date: 09/25/2021   ADDENDUM REPORT: 09/25/2021 12:43 ADDENDUM: PATHOLOGY revealed: Site A. BREAST, LEFT, 3 O'CLOCK, 14 CM FROM NIPPLE (HEART CLIP); ULTRASOUND-GUIDED CORE BIOPSY: - INVASIVE MAMMARY CARCINOMA WITH MUCINOUS FEATURES. 8 mm in this sample. Grade 1. Ductal carcinoma in situ: Not identified. Lymphovascular invasion: Not identified. Pathology results are CONCORDANT with imaging findings, per Dr. Valentino Saxon. PATHOLOGY revealed: Site B. BREAST, LEFT, 3 O'CLOCK, 13 CM FROM NIPPLE (VENUS CLIP); ULTRASOUND-GUIDED CORE BIOPSY: - INVASIVE MAMMARY CARCINOMA WITH MUCINOUS FEATURES. 9 mm in this sample. Histologic grade of invasive carcinoma: Grade 1. Ductal carcinoma in situ: Not identified. Lymphovascular invasion: Not identified. Comment: The tumor has similar appearances in A and B, with more cystic features in the B sample. The histologic grade and mucinous features match the previously excised left breast multifocal carcinoma, based on review of the pathology reports from 2020: Pathology results are CONCORDANT with imaging findings, per Dr. Valentino Saxon. Pathology results and  recommendations below were discussed with patient and son Sharissa Brierley) by telephone on 09/24/2021. Patient reported biopsy site within normal limits with slight tenderness at the site. Post biopsy care instructions were reviewed, questions were answered and my direct phone number was provided to patient. Patient was instructed to call [facility] if any concerns or questions arise related to the biopsy. RECOMMENDATIONS: Surgical consultation. Request for surgical consultation relayed to Al Pimple RN at [facility] by Electa Sniff RN on 09/24/2021. Pathology results reported by Electa Sniff RN on 09/25/2021. Electronically Signed   By: Valentino Saxon M.D.   On: 09/25/2021 12:43   Result Date: 09/25/2021 CLINICAL DATA:  Palpable area in the LEFT breast. 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 EXAM: ULTRASOUND GUIDED LEFT BREAST CORE NEEDLE BIOPSY x2 COMPARISON:  Previous exam(s). PROCEDURE: I met with the patient and we discussed the procedure of ultrasound-guided biopsy, including benefits and alternatives. We discussed the high likelihood of a successful procedure. We discussed the risks of the procedure, including infection, bleeding, tissue injury, clip migration, and inadequate sampling. Informed written consent was given. The usual time-out protocol was performed immediately prior to the procedure. Site 1: Isoechoic mass, 3:00 14cm FN Lesion quadrant: Upper outer quadrant Using sterile technique and 1% lidocaine and 1% lidocaine with epinephrine as local anesthetic, under direct ultrasound visualization, a 14 gauge spring-loaded device was used to perform biopsy of a mass at 3 o'clock 14 cm from the nipple using a lateral approach. At the conclusion of the procedure a HEART tissue marker clip was deployed into the biopsy cavity. Follow up 2 view mammogram was performed and dictated separately. Site 2: Cystic and solid mass,  3:00 13cm FN Lesion quadrant: Upper outer quadrant Using sterile technique and 1% lidocaine and 1% lidocaine with epinephrine as local anesthetic, under direct ultrasound visualization, a 14 gauge spring-loaded device was used to perform biopsy of a mass at 3 o'clock 13 cm from the nipple using a lateral approach. At the conclusion of the procedure a VENUS tissue marker clip was deployed into the biopsy cavity. Follow up 2 view mammogram was performed and dictated separately. IMPRESSION: Ultrasound guided biopsy of 2 adjacent breast masses at the lumpectomy bed. No apparent complications. Electronically Signed: By: Valentino Saxon M.D. On: 09/23/2021 09:13  [place] LT BREAST BX W LOC DEV EA ADD LESION IMG BX  SPEC US GUIDE  Addendum Date: 09/25/2021   ADDENDUM REPORT: 09/25/2021 12:43 ADDENDUM: PATHOLOGY revealed: Site A. BREAST, LEFT, 3 O'CLOCK, 14 CM FROM NIPPLE (HEART CLIP); ULTRASOUND-GUIDED CORE BIOPSY: - INVASIVE MAMMARY CARCINOMA WITH MUCINOUS FEATURES. 8 mm in this sample. Grade 1. Ductal carcinoma in situ: Not identified. Lymphovascular invasion: Not identified. Pathology results are CONCORDANT with imaging findings, per Dr. Valentino Saxon. PATHOLOGY revealed: Site B. BREAST, LEFT, 3 O'CLOCK, 13 CM FROM NIPPLE (VENUS CLIP); ULTRASOUND-GUIDED CORE BIOPSY: - INVASIVE MAMMARY CARCINOMA WITH MUCINOUS FEATURES. 9 mm in this sample. Histologic grade of invasive carcinoma: Grade 1. Ductal carcinoma in situ: Not identified. Lymphovascular invasion: Not identified. Comment: The tumor has similar appearances in A and B, with more cystic features in the B sample. The histologic grade and mucinous features match the previously excised left breast multifocal carcinoma, based on review of the pathology reports from 2020: Pathology results are CONCORDANT with imaging findings, per Dr. Valentino Saxon. Pathology results and recommendations below were discussed with patient and son Runa Whittingham) by telephone on  09/24/2021. Patient reported biopsy site within normal limits with slight tenderness at the site. Post biopsy care instructions were reviewed, questions were answered and my direct phone number was provided to patient. Patient was instructed to call [facility] if any concerns or questions arise related to the biopsy. RECOMMENDATIONS: Surgical consultation. Request for surgical consultation relayed to Al Pimple RN at [facility] by Electa Sniff RN on 09/24/2021. Pathology results reported by Electa Sniff RN on 09/25/2021. Electronically Signed   By: Valentino Saxon M.D.   On: 09/25/2021 12:43   Result Date: 09/25/2021 CLINICAL DATA:  Palpable area in the LEFT breast. 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 EXAM: ULTRASOUND GUIDED LEFT BREAST CORE NEEDLE BIOPSY x2 COMPARISON:  Previous exam(s). PROCEDURE: I met with the patient and we discussed the procedure of ultrasound-guided biopsy, including benefits and alternatives. We discussed the high likelihood of a successful procedure. We discussed the risks of the procedure, including infection, bleeding, tissue injury, clip migration, and inadequate sampling. Informed written consent was given. The usual time-out protocol was performed immediately prior to the procedure. Site 1: Isoechoic mass, 3:00 14cm FN Lesion quadrant: Upper outer quadrant Using sterile technique and 1% lidocaine and 1% lidocaine with epinephrine as local anesthetic, under direct ultrasound visualization, a 14 gauge spring-loaded device was used to perform biopsy of a mass at 3 o'clock 14 cm from the nipple using a lateral approach. At the conclusion of the procedure a HEART tissue marker clip was deployed into the biopsy cavity. Follow up 2 view mammogram was performed and dictated separately. Site 2: Cystic and solid mass, 3:00 13cm FN Lesion quadrant: Upper outer quadrant Using sterile technique and 1% lidocaine  and 1% lidocaine with epinephrine as local anesthetic, under direct ultrasound visualization, a 14 gauge spring-loaded device was used to perform biopsy of a mass at 3 o'clock 13 cm from the nipple using a lateral approach. At the conclusion of the procedure a VENUS tissue marker clip was deployed into the biopsy cavity. Follow up 2 view mammogram was performed and dictated separately. IMPRESSION: Ultrasound guided biopsy of 2 adjacent breast masses at the lumpectomy bed. No apparent complications. Electronically Signed: By: Valentino Saxon M.D. On: 09/23/2021 09:13     Subjective: Seen and examined on the day of discharge.  Stable, no distress.  Abdominal pain resolved.  Tolerating p.o. intake.  Stable for  discharge  Discharge Exam: Vitals:   10/10/21 0332 10/10/21 0749  BP: (!) 188/77 (!) 152/69  Pulse: 85 95  Resp: 20 18  Temp: 99.2 F (37.3 C) 99.2 F (37.3 C)  SpO2: 98% 100%   Vitals:   10/09/21 1534 10/09/21 2100 10/10/21 0332 10/10/21 0749  BP: (!) 155/66 (!) 153/64 (!) 188/77 (!) 152/69  Pulse: 88 89 85 95  Resp: $Remo'18 20 20 18  'oPGsZ$ Temp: 99 F (37.2 C) 98.5 F (36.9 C) 99.2 F (37.3 C) 99.2 F (37.3 C)  TempSrc: Oral Oral Oral Oral  SpO2: 98% 98% 98% 100%  Weight:      Height:        General: Pt is alert, awake, not in acute distress Cardiovascular: RRR, S1/S2 +, no rubs, no gallops Respiratory: CTA bilaterally, no wheezing, no rhonchi Abdominal: Soft, NT, ND, bowel sounds + Extremities: no edema, no cyanosis    The results of significant diagnostics from this hospitalization (including imaging, microbiology, ancillary and laboratory) are listed below for reference.     Microbiology: Recent Results (from the past 240 hour(s))  Resp Panel by RT-PCR (Flu A&B, Covid) Nasopharyngeal Swab     Status: None   Collection Time: 10/06/21  3:25 PM   Specimen: Nasopharyngeal Swab; Nasopharyngeal(NP) swabs in vial transport medium  Result Value Ref Range Status   SARS  Coronavirus 2 by RT PCR NEGATIVE NEGATIVE Final    Comment: (NOTE) SARS-CoV-2 target nucleic acids are NOT DETECTED.  The SARS-CoV-2 RNA is generally detectable in upper respiratory specimens during the acute phase of infection. The lowest concentration of SARS-CoV-2 viral copies this assay can detect is 138 copies/mL. A negative result does not preclude SARS-Cov-2 infection and should not be used as the sole basis for treatment or other patient management decisions. A negative result may occur with  improper specimen collection/handling, submission of specimen other than nasopharyngeal swab, presence of viral mutation(s) within the areas targeted by this assay, and inadequate number of viral copies(<138 copies/mL). A negative result must be combined with clinical observations, patient history, and epidemiological information. The expected result is Negative.  Fact Sheet for Patients:  EntrepreneurPulse.com.au  Fact Sheet for Healthcare Providers:  IncredibleEmployment.be  This test is no t yet approved or cleared by the [place] FDA and  has been authorized for detection and/or diagnosis of SARS-CoV-2 by FDA under an Emergency Use Authorization (EUA). This EUA will remain  in effect (meaning this test can be used) for the duration of the COVID-19 declaration under Section 564(b)(1) of the Act, 21 U.S.C.section 360bbb-3(b)(1), unless the authorization is terminated  or revoked sooner.       Influenza A by PCR NEGATIVE NEGATIVE Final   Influenza B by PCR NEGATIVE NEGATIVE Final    Comment: (NOTE) The Xpert Xpress SARS-CoV-2/FLU/RSV plus assay is intended as an aid in the diagnosis of influenza from Nasopharyngeal swab specimens and should not be used as a sole basis for treatment. Nasal washings and aspirates are unacceptable for Xpert Xpress SARS-CoV-2/FLU/RSV testing.  Fact Sheet for  Patients: EntrepreneurPulse.com.au  Fact Sheet for Healthcare Providers: IncredibleEmployment.be  This test is not yet approved or cleared by the [place] FDA and has been authorized for detection and/or diagnosis of SARS-CoV-2 by FDA under an Emergency Use Authorization (EUA). This EUA will remain in effect (meaning this test can be used) for the duration of the COVID-19 declaration under Section 564(b)(1) of the Act, 21 U.S.C. section 360bbb-3(b)(1), unless the authorization is terminated or  revoked.  Performed at [facility], [address] [place], [place] [address]      Labs: BNP (last 3 results) No results for input(s): BNP in the last 8760 hours. Basic Metabolic Panel: Recent Labs  Lab 10/06/21 1535 10/07/21 0706 10/09/21 0854 10/09/21 1404 10/10/21 0824  NA 137 137 137 137 136  K 3.3* 3.4* 3.3* 3.0* 3.6  CL 102 103 102 103 104  CO2 $Re'24 28 27 25 25  'yaw$ GLUCOSE 71 95 92 135* 105*  BUN 9 11 7* 6* <5*  CREATININE 0.96 1.14* 1.01* 0.98 0.92  CALCIUM 9.2 8.7* 8.9 9.0 8.7*  MG  --  2.1  --   --   --    Liver Function Tests: Recent Labs  Lab 10/06/21 1535  AST 29  ALT 16  ALKPHOS 50  BILITOT 0.7  PROT 7.7  ALBUMIN 4.0   No results for input(s): LIPASE, AMYLASE in the last 168 hours. No results for input(s): AMMONIA in the last 168 hours. CBC: Recent Labs  Lab 10/07/21 0706 10/07/21 1644 10/08/21 0730 10/09/21 1404 10/10/21 0824  WBC 7.3 11.8* 9.7 10.8* 10.5  NEUTROABS  --   --  6.7 8.5* 8.8*  HGB 9.6* 10.9* 10.6* 10.0* 10.3*  HCT 29.4* 33.9* 33.0* 31.0* 31.7*  MCV 87.0 88.5 88.2 88.6 89.5  PLT 198 228 217 193 202   Cardiac Enzymes: No results for input(s): CKTOTAL, CKMB, CKMBINDEX, TROPONINI in the last 168 hours. BNP: Invalid input(s): POCBNP CBG: No results for input(s): GLUCAP in the last 168 hours. D-Dimer No results for input(s): DDIMER in the last 72 hours. Hgb A1c No results for  input(s): HGBA1C in the last 72 hours. Lipid Profile No results for input(s): CHOL, HDL, LDLCALC, TRIG, CHOLHDL, LDLDIRECT in the last 72 hours. Thyroid function studies No results for input(s): TSH, T4TOTAL, T3FREE, THYROIDAB in the last 72 hours.  Invalid input(s): FREET3 Anemia work up No results for input(s): VITAMINB12, FOLATE, FERRITIN, TIBC, IRON, RETICCTPCT in the last 72 hours. Urinalysis    Component Value Date/Time   COLORURINE STRAW (A) 07/29/2021 0743   APPEARANCEUR CLEAR (A) 07/29/2021 0743   LABSPEC 1.004 (L) 07/29/2021 0743   PHURINE 7.0 07/29/2021 0743   GLUCOSEU NEGATIVE 07/29/2021 0743   HGBUR NEGATIVE 07/29/2021 0743   BILIRUBINUR NEGATIVE 07/29/2021 0743   KETONESUR NEGATIVE 07/29/2021 0743   PROTEINUR NEGATIVE 07/29/2021 0743   NITRITE NEGATIVE 07/29/2021 0743   LEUKOCYTESUR NEGATIVE 07/29/2021 0743   Sepsis Labs Invalid input(s): PROCALCITONIN,  WBC,  LACTICIDVEN Microbiology Recent Results (from the past 240 hour(s))  Resp Panel by RT-PCR (Flu A&B, Covid) Nasopharyngeal Swab     Status: None   Collection Time: 10/06/21  3:25 PM   Specimen: Nasopharyngeal Swab; Nasopharyngeal(NP) swabs in vial transport medium  Result Value Ref Range Status   SARS Coronavirus 2 by RT PCR NEGATIVE NEGATIVE Final    Comment: (NOTE) SARS-CoV-2 target nucleic acids are NOT DETECTED.  The SARS-CoV-2 RNA is generally detectable in upper respiratory specimens during the acute phase of infection. The lowest concentration of SARS-CoV-2 viral copies this assay can detect is 138 copies/mL. A negative result does not preclude SARS-Cov-2 infection and should not be used as the sole basis for treatment or other patient management decisions. A negative result may occur with  improper specimen collection/handling, submission of specimen other than nasopharyngeal swab, presence of viral mutation(s) within the areas targeted by this assay, and inadequate number of viral copies(<138  copies/mL). A negative result must  be combined with clinical observations, patient history, and epidemiological information. The expected result is Negative.  Fact Sheet for Patients:  EntrepreneurPulse.com.au  Fact Sheet for Healthcare Providers:  IncredibleEmployment.be  This test is no t yet approved or cleared by the [place] FDA and  has been authorized for detection and/or diagnosis of SARS-CoV-2 by FDA under an Emergency Use Authorization (EUA). This EUA will remain  in effect (meaning this test can be used) for the duration of the COVID-19 declaration under Section 564(b)(1) of the Act, 21 U.S.C.section 360bbb-3(b)(1), unless the authorization is terminated  or revoked sooner.       Influenza A by PCR NEGATIVE NEGATIVE Final   Influenza B by PCR NEGATIVE NEGATIVE Final    Comment: (NOTE) The Xpert Xpress SARS-CoV-2/FLU/RSV plus assay is intended as an aid in the diagnosis of influenza from Nasopharyngeal swab specimens and should not be used as a sole basis for treatment. Nasal washings and aspirates are unacceptable for Xpert Xpress SARS-CoV-2/FLU/RSV testing.  Fact Sheet for Patients: EntrepreneurPulse.com.au  Fact Sheet for Healthcare Providers: IncredibleEmployment.be  This test is not yet approved or cleared by the [place] FDA and has been authorized for detection and/or diagnosis of SARS-CoV-2 by FDA under an Emergency Use Authorization (EUA). This EUA will remain in effect (meaning this test can be used) for the duration of the COVID-19 declaration under Section 564(b)(1) of the Act, 21 U.S.C. section 360bbb-3(b)(1), unless the authorization is terminated or revoked.  Performed at [facility], [address], [place], [place] [address]      Time coordinating discharge: Over 30 minutes  SIGNED:   Sidney Ace, MD  [facility] 10/10/2021, 2:19  PM Pager   If 7PM-7AM, please contact night-coverage

## 2021-10-10 NOTE — TOC Initial Note (Signed)
Transition of Care [facility]) - Initial/Assessment Note    Patient Details  Name: Cindy Huerta MRN: [id number] Date of Birth: 1946-02-24  Transition of Care [facility]) CM/SW Contact:    Magnus Ivan, LCSW Phone Number: 10/10/2021, 1:25 PM  Clinical Narrative:            Met with patient at bedside to discuss DC planning. Patient lives with her son Cindy Huerta and daughter in Sports coach. PCP is Dr. Lovie Macadamia. [facility] [place] [place] [facility]. Patient says she has access to a RW, cane, and 3in1. Family provides transportation. Patient defers to son regarding recs for [place]. CSW called son Cindy Huerta who is agreeable to [facility], confirmed home address in [place]. Referral made to [facility] with [facility].       Expected Discharge Plan: [place] Barriers to Discharge: Continued Medical Work up   Patient Goals and CMS Choice Patient states their goals for this hospitalization and ongoing recovery are:: home with home health CMS Medicare.gov Compare Post Acute Care list provided to:: Patient Choice offered to / list presented to : Patient, Adult Children  Expected Discharge Plan and Services Expected Discharge Plan: [place]       Living arrangements for the past 2 months: Single Family Home                                      Prior Living Arrangements/Services Living arrangements for the past 2 months: Single Family Home Lives with:: Adult Children Patient language and need for interpreter reviewed:: Yes Do you feel safe going back to the place where you live?: Yes      Need for Family Participation in Patient Care: Yes (Comment) Care giver support system in place?: Yes (comment) Current home services: DME Criminal Activity/Legal Involvement Pertinent to Current Situation/Hospitalization: No - Comment as needed  Activities of Daily Living      Permission Sought/Granted Permission sought to share information with : Facility  Sport and exercise psychologist, Family Supports Permission granted to share information with : Yes, Verbal Permission Granted  Share Information with NAME: Cindy Huerta- son  Permission granted to share info w AGENCY: [place] agencies- no preference        Emotional Assessment Appearance:: Appears stated age     Orientation: : Oriented to Self, Oriented to Place, Oriented to  Time, Oriented to Situation Alcohol / Substance Use: Not Applicable Psych Involvement: No (comment)  Admission diagnosis:  Hematochezia [K92.1] Diverticulosis [K57.90] Weakness [R53.1] GIB (gastrointestinal bleeding) [K92.2] Intractable nausea and vomiting [R11.2] Patient Active Problem List   Diagnosis Date Noted   Intractable nausea and vomiting 10/09/2021   Weakness 10/07/2021   Carcinoma of overlapping sites of left breast in female, estrogen receptor positive ([place]) 10/06/2021   Dizziness 10/06/2021   Rectal bleeding 10/06/2021   Nausea & vomiting 10/06/2021   GIB (gastrointestinal bleeding) 10/06/2021   Left-sided weakness 07/29/2021   HLD (hyperlipidemia) 07/29/2021   Hypothyroidism 07/29/2021   Left sided numbness 07/29/2021   Depression    CKD (chronic kidney disease), stage IIIa    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   Epigastric pain 04/23/2021   GERD (gastroesophageal reflux disease) 04/23/2021   Benign essential hypertension 01/20/2021   History of breast cancer 01/20/2021   History of TIA (transient ischemic attack) 01/20/2021   Palpitations 01/20/2021   Malignant neoplasm of upper-outer quadrant of left female  breast ([place]) 12/06/2018   PCP:  Juluis Pitch, MD [id number] [place] [id number]: [phone]: [phone]    Social Determinants of Health (SDOH) Interventions    Readmission Risk Interventions Readmission Risk Prevention Plan 10/10/2021 10/09/2021  Transportation Screening Complete Complete  PCP or  Specialist Appt within 5-7 Days Complete -  Home Care Screening Complete -  Medication Review (RN CM) Complete Complete  Some recent data might be hidden

## 2021-10-10 NOTE — Progress Notes (Signed)
Did not tolerate night medications well. Felt nauseated afterwards, zofran given and ativan was given for anxiety. Also had left lower abdominal pain which resolved. Took synthroid in the am with no problems.

## 2021-10-10 NOTE — Progress Notes (Signed)
Mobility Specialist - Progress Note   10/10/21 1159  Mobility  Activity Transferred to/from [facility];Transferred:  Bed to chair  Level of Assistance Modified independent, requires aide device or extra time  Assistive Device Front wheel walker  Distance Ambulated (ft) 15 ft  Mobility Ambulated independently in room;Out of bed for toileting;Sit up in bed/chair position for meals  Mobility Response Tolerated well  Mobility performed by Mobility specialist  $Mobility charge 1 Mobility    Pt transferred from chair-BSC-bed modI. Ambulated to sink for hand hygiene. Alarm set.    Kathee Delton Mobility Specialist 10/10/21, 12:01 PM

## 2021-10-10 NOTE — Progress Notes (Addendum)
Mobility Specialist - Progress Note   10/10/21 1100  Mobility  Activity Transferred:  Bed to chair;Transferred to/from [facility]  Level of Assistance Modified independent, requires aide device or extra time  Assistive Device Front wheel walker  Distance Ambulated (ft) 6 ft  Mobility Out of bed to chair with meals;Out of bed for toileting  Mobility Response Tolerated well  Mobility performed by Mobility specialist  $Mobility charge 1 Mobility    Pt lying in bed upon arrival, utilizing RA. Transferred bed-BSC-chair modI. Very small BM. No complaints of pain or nausea. HR 75-90 bpm. Voices that she's ready to go home. Pt left in chair with alarm set.    Kathee Delton Mobility Specialist 10/10/21, 11:44 AM

## 2021-10-10 NOTE — Progress Notes (Signed)
Patient discharged to home via wheelchair accompanied by family.  Patient discharged with all pertinent information, prescriptions, and personal belongings.  Patient able to teach back instructions, all questions answered.  IV site d/ced.  No acute distress noted. Care relinquished.

## 2021-10-12 LAB — LYME DISEASE, WESTERN BLOT
IgG P18 Ab.: ABSENT
IgG P23 Ab.: ABSENT
IgG P30 Ab.: ABSENT
IgG P45 Ab.: ABSENT
IgG P66 Ab.: ABSENT
IgM P23 Ab.: ABSENT
IgM P39 Ab.: ABSENT
IgM P41 Ab.: ABSENT
Lyme IgG Wb: POSITIVE — AB
Lyme IgM Wb: NEGATIVE

## 2021-10-14 ENCOUNTER — Encounter: Payer: Self-pay | Admitting: Gastroenterology

## 2021-10-14 ENCOUNTER — Other Ambulatory Visit: Payer: Self-pay

## 2021-10-14 ENCOUNTER — Ambulatory Visit (INDEPENDENT_AMBULATORY_CARE_PROVIDER_SITE_OTHER): Payer: Medicare PPO | Admitting: Gastroenterology

## 2021-10-14 VITALS — BP 204/43 | HR 45 | Temp 98.3°F | Wt 171.2 lb

## 2021-10-14 DIAGNOSIS — Z8719 Personal history of other diseases of the digestive system: Secondary | ICD-10-CM

## 2021-10-14 NOTE — Progress Notes (Signed)
Patient declined doing a colonoscopy at this time due to her wanting to see her general surgeon first and then seeing what the outcome will be. Then she will give [place] a call and let us know if she will move forward with scheduling a colonoscopy or not.

## 2021-10-14 NOTE — Progress Notes (Signed)
Jonathon Bellows MD, MRCP(U.K) [address]  [place]  [place], [place] [address]  Main: [phone]  Fax: [phone]   Primary Care Physician: Juluis Pitch, MD  Primary Gastroenterologist:  Dr. Jonathon Bellows   Chief complaint: Hospital follow-up   HPI: Cindy Huerta is a 75 y.o. female who was previously been a patient of [facility] and sees Dr. Alice Reichert for nausea vomiting, GERD.Had in 2019 undergone a colonoscopy and a few polyps were resected.  Diverticulosis of the sigmoid colon was noted.  EGD showed no gross abnormality except for possible Candida.  Status postcholecystectomy.  Small hiatal hernia on upper GI series seen in May 2022.    Was seen by myself when she presented to the hospital on 10/07/2021 with bright red blood per rectum, vomiting and diarrhea.  10/06/2021 CT scan of the abdomen and pelvis showed colonic diverticulosis otherwise was normal.  We will plan to perform EGD and colonoscopy but she could not perform the bowel prep and hence only upper endoscopy was performed.  Which showed no abnormalities.  She was on aspirin and Plavix.  Since hospital discharge she denies any rectal bleeding.  She is willing to try to prep for colonoscopy yet again.  No other complaints presently.  Occasionally has some cramping lower abdominal pain   Current Outpatient Medications  Medication Sig Dispense Refill   acetaminophen (TYLENOL) 500 MG tablet Take 500-1,000 mg by mouth as needed for pain.     aspirin 81 MG EC tablet Take 81 mg by mouth daily.     atorvastatin (LIPITOR) 80 MG tablet Take 80 mg by mouth daily.     Cyanocobalamin (VITAMIN B-12 PO) Take 1 tablet by mouth daily.     diltiazem (CARDIZEM CD) 120 MG 24 hr capsule Take 120 mg by mouth daily.     fexofenadine (ALLEGRA) 180 MG tablet Take 180 mg by mouth as needed for allergies.     FLUoxetine (PROZAC) 10 MG capsule Take 10 mg by mouth daily.     hydrALAZINE (APRESOLINE) 25 MG tablet Take 25 mg by mouth 3  (three) times daily.     levothyroxine (SYNTHROID) 75 MCG tablet Take 75 mcg by mouth every morning.     magnesium oxide (MAG-OX) 400 MG tablet Take 400 mg by mouth daily.     metoprolol succinate (TOPROL-XL) 50 MG 24 hr tablet Take 50 mg by mouth 2 (two) times daily.     Multiple Vitamins-Minerals (THERA-M) TABS Take 1 tablet by mouth daily.     ondansetron (ZOFRAN ODT) 4 MG disintegrating tablet Take 1 tablet (4 mg total) by mouth every 8 (eight) hours as needed for nausea or vomiting. 20 tablet 0   pantoprazole (PROTONIX) 40 MG tablet Take 1 tablet (40 mg total) by mouth daily. 30 tablet 1   potassium chloride SA (KLOR-CON) 20 MEQ tablet Take 20 mEq by mouth daily.     telmisartan (MICARDIS) 80 MG tablet Take 80 mg by mouth daily.     No current facility-administered medications for this visit.    Allergies as of 10/14/2021 - Review Complete 10/14/2021  Allergen Reaction Noted   Latex Rash 04/25/2019   Lisinopril Cough 12/13/2018   Procaine Other (See Comments) 07/10/2013   Diphenhydramine hcl Other (See Comments) 01/09/2018   Clarithromycin Other (See Comments) 07/10/2013   Oxycodone Nausea And Vomiting and Anxiety 02/02/2021    ROS:  General: Negative for anorexia, weight loss, fever, chills, fatigue, weakness. ENT: Negative for hoarseness, difficulty  swallowing , nasal congestion. CV: Negative for chest pain, angina, palpitations, dyspnea on exertion, peripheral edema.  Respiratory: Negative for dyspnea at rest, dyspnea on exertion, cough, sputum, wheezing.  GI: See history of present illness. GU:  Negative for dysuria, hematuria, urinary incontinence, urinary frequency, nocturnal urination.  Endo: Negative for unusual weight change.    Physical Examination:   BP (!) 204/43   Pulse (!) 45   Temp 98.3 F (36.8 C) (Oral)   Wt 171 lb 3.2 oz (77.7 kg)   BMI 27.63 kg/m   General: Well-nourished, well-developed in no acute distress.  Eyes: No icterus. Conjunctivae  pink. Mouth: Oropharyngeal mucosa moist and pink , no lesions erythema or exudate. Abdomen: Bowel sounds are normal, nontender, nondistended, no hepatosplenomegaly or masses, no abdominal bruits or hernia , no rebound or guarding.   Neuro: Alert and oriented x 3.  Grossly intact. Skin: Warm and dry, no jaundice.   Psych: Alert and cooperative, normal mood and affect.   Imaging Studies: DG Abd 1 View  Result Date: 10/09/2021 CLINICAL DATA:  75 year old female with bloody diarrhea. EXAM: ABDOMEN - 1 VIEW COMPARISON:  CT abdomen pelvis from 10/06/2021 FINDINGS: The bowel gas pattern is normal. No radio-opaque calculi or other significant radiographic abnormality are seen. Cholecystectomy clips in right upper quadrant. IMPRESSION: Nonobstructive bowel gas pattern. Electronically Signed   By: Ruthann Cancer M.D.   On: 10/09/2021 14:17   CT Head Wo Contrast  Result Date: 10/06/2021 CLINICAL DATA:  A 75 year old female with new diagnosis of breast cancer is presents with sudden onset of vomiting and disorientation. EXAM: CT HEAD WITHOUT CONTRAST TECHNIQUE: Contiguous axial images were obtained from the base of the skull through the vertex without intravenous contrast. COMPARISON:  July 29, 2021, CT and MRI of the brain. FINDINGS: Brain: No evidence of acute infarction, hemorrhage, hydrocephalus, extra-axial collection or mass lesion/mass effect. Signs of atrophy and chronic microvascular ischemic change. Findings unchanged compared to previous imaging Vascular: No hyperdense vessel or unexpected calcification. Skull: Normal. Negative for fracture or focal lesion. Sinuses/Orbits: Visualized paranasal sinuses and orbits are unremarkable. Other: None IMPRESSION: No acute intracranial abnormality. Signs of atrophy and chronic microvascular ischemic change. Electronically Signed   By: Zetta Bills M.D.   On: 10/06/2021 13:14   CT ABDOMEN PELVIS W CONTRAST  Result Date: 10/06/2021 CLINICAL DATA:  Lower GI  bleed, abdominal pain, history hypertension, newly diagnosed breast cancer EXAM: CT ABDOMEN AND PELVIS WITH CONTRAST TECHNIQUE: Multidetector CT imaging of the abdomen and pelvis was performed using the standard protocol following bolus administration of intravenous contrast. CONTRAST:  165m OMNIPAQUE IOHEXOL 300 MG/ML  SOLN COMPARISON:  04/01/2021 FINDINGS: Lower chest: Lung bases clear Hepatobiliary: Gallbladder surgically absent. Cyst lateral segment LEFT lobe liver 7 mm diameter previously 6 mm. Remainder of liver normal appearance. Pancreas: Normal appearance Spleen: Normal appearance Adrenals/Urinary Tract: Adrenal glands, kidneys, ureters, and bladder normal appearance Stomach/Bowel: Appendix not identified. Ingested radiopacity within cecum. Scattered colonic diverticulosis without evidence of diverticulitis. Stomach and remaining bowel loops unremarkable. Vascular/Lymphatic: Atherosclerotic calcifications aorta and iliac arteries without aneurysm. No adenopathy. Reproductive: Uterus surgically absent with nonvisualization of ovaries Other: No free air or free fluid. No hernia or inflammatory process. Musculoskeletal: Diffuse osseous demineralization. IMPRESSION: Colonic diverticulosis without evidence of diverticulitis. No acute intra-abdominal or intrapelvic abnormalities. Aortic Atherosclerosis (ICD10-I70.0). Electronically Signed   By: MLavonia DanaM.D.   On: 10/06/2021 18:06   MM CLIP PLACEMENT LEFT  Result Date: 09/23/2021 CLINICAL DATA:  Status post 2 site  ultrasound-guided biopsy EXAM: 3D DIAGNOSTIC LEFT MAMMOGRAM POST ULTRASOUND BIOPSY COMPARISON:  Previous exam(s). FINDINGS: Site 1: 3D Mammographic images were obtained following ultrasound guided biopsy of a isoechoic mass at 3 o'clock 14 cm from the nipple. The Heart shaped biopsy marking clip is in expected position at the site of biopsy. Site 2: 3D Mammographic images were obtained following ultrasound guided biopsy of a complex cystic and  solid mass at 3 o'clock 13 cm from the nipple. The VENUS shaped biopsy marking clip is in expected position at the site of biopsy. This is at the site of dominant mass noted within the lumpectomy bed IMPRESSION: 1. Appropriate positioning of the HEART shaped biopsy marking clip at the site of biopsy in the outer breast. 2. Appropriate positioning of the VENUS shaped biopsy marking clip at the site of biopsy in the outer breast. Final Assessment: Post Procedure Mammograms for Marker Placement Electronically Signed   By: Valentino Saxon M.D.   On: 09/23/2021 09:17  [place] LT BREAST BX W LOC DEV 1ST LESION IMG BX SPEC US GUIDE  Addendum Date: 09/25/2021   ADDENDUM REPORT: 09/25/2021 12:43 ADDENDUM: PATHOLOGY revealed: Site A. BREAST, LEFT, 3 O'CLOCK, 14 CM FROM NIPPLE (HEART CLIP); ULTRASOUND-GUIDED CORE BIOPSY: - INVASIVE MAMMARY CARCINOMA WITH MUCINOUS FEATURES. 8 mm in this sample. Grade 1. Ductal carcinoma in situ: Not identified. Lymphovascular invasion: Not identified. Pathology results are CONCORDANT with imaging findings, per Dr. Valentino Saxon. PATHOLOGY revealed: Site B. BREAST, LEFT, 3 O'CLOCK, 13 CM FROM NIPPLE (VENUS CLIP); ULTRASOUND-GUIDED CORE BIOPSY: - INVASIVE MAMMARY CARCINOMA WITH MUCINOUS FEATURES. 9 mm in this sample. Histologic grade of invasive carcinoma: Grade 1. Ductal carcinoma in situ: Not identified. Lymphovascular invasion: Not identified. Comment: The tumor has similar appearances in A and B, with more cystic features in the B sample. The histologic grade and mucinous features match the previously excised left breast multifocal carcinoma, based on review of the pathology reports from 2020: Pathology results are CONCORDANT with imaging findings, per Dr. Valentino Saxon. Pathology results and recommendations below were discussed with patient and son Brighid Koch) by telephone on 09/24/2021. Patient reported biopsy site within normal limits with slight tenderness at the site. Post  biopsy care instructions were reviewed, questions were answered and my direct phone number was provided to patient. Patient was instructed to call [facility] if any concerns or questions arise related to the biopsy. RECOMMENDATIONS: Surgical consultation. Request for surgical consultation relayed to Al Pimple RN at [facility] by Electa Sniff RN on 09/24/2021. Pathology results reported by Electa Sniff RN on 09/25/2021. Electronically Signed   By: Valentino Saxon M.D.   On: 09/25/2021 12:43   Result Date: 09/25/2021 CLINICAL DATA:  Palpable area in the LEFT breast. 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 EXAM: ULTRASOUND GUIDED LEFT BREAST CORE NEEDLE BIOPSY x2 COMPARISON:  Previous exam(s). PROCEDURE: I met with the patient and we discussed the procedure of ultrasound-guided biopsy, including benefits and alternatives. We discussed the high likelihood of a successful procedure. We discussed the risks of the procedure, including infection, bleeding, tissue injury, clip migration, and inadequate sampling. Informed written consent was given. The usual time-out protocol was performed immediately prior to the procedure. Site 1: Isoechoic mass, 3:00 14cm FN Lesion quadrant: Upper outer quadrant Using sterile technique and 1% lidocaine and 1% lidocaine with epinephrine as local anesthetic, under direct ultrasound visualization, a 14 gauge spring-loaded device was used to perform biopsy  of a mass at 3 o'clock 14 cm from the nipple using a lateral approach. At the conclusion of the procedure a HEART tissue marker clip was deployed into the biopsy cavity. Follow up 2 view mammogram was performed and dictated separately. Site 2: Cystic and solid mass, 3:00 13cm FN Lesion quadrant: Upper outer quadrant Using sterile technique and 1% lidocaine and 1% lidocaine with epinephrine as local anesthetic, under direct ultrasound visualization, a 14  gauge spring-loaded device was used to perform biopsy of a mass at 3 o'clock 13 cm from the nipple using a lateral approach. At the conclusion of the procedure a VENUS tissue marker clip was deployed into the biopsy cavity. Follow up 2 view mammogram was performed and dictated separately. IMPRESSION: Ultrasound guided biopsy of 2 adjacent breast masses at the lumpectomy bed. No apparent complications. Electronically Signed: By: Valentino Saxon M.D. On: 09/23/2021 09:13  [place] LT BREAST BX W LOC DEV EA ADD LESION IMG BX SPEC US GUIDE  Addendum Date: 09/25/2021   ADDENDUM REPORT: 09/25/2021 12:43 ADDENDUM: PATHOLOGY revealed: Site A. BREAST, LEFT, 3 O'CLOCK, 14 CM FROM NIPPLE (HEART CLIP); ULTRASOUND-GUIDED CORE BIOPSY: - INVASIVE MAMMARY CARCINOMA WITH MUCINOUS FEATURES. 8 mm in this sample. Grade 1. Ductal carcinoma in situ: Not identified. Lymphovascular invasion: Not identified. Pathology results are CONCORDANT with imaging findings, per Dr. Valentino Saxon. PATHOLOGY revealed: Site B. BREAST, LEFT, 3 O'CLOCK, 13 CM FROM NIPPLE (VENUS CLIP); ULTRASOUND-GUIDED CORE BIOPSY: - INVASIVE MAMMARY CARCINOMA WITH MUCINOUS FEATURES. 9 mm in this sample. Histologic grade of invasive carcinoma: Grade 1. Ductal carcinoma in situ: Not identified. Lymphovascular invasion: Not identified. Comment: The tumor has similar appearances in A and B, with more cystic features in the B sample. The histologic grade and mucinous features match the previously excised left breast multifocal carcinoma, based on review of the pathology reports from 2020: Pathology results are CONCORDANT with imaging findings, per Dr. Valentino Saxon. Pathology results and recommendations below were discussed with patient and son Nattie Lazenby) by telephone on 09/24/2021. Patient reported biopsy site within normal limits with slight tenderness at the site. Post biopsy care instructions were reviewed, questions were answered and my direct phone number  was provided to patient. Patient was instructed to call [facility] if any concerns or questions arise related to the biopsy. RECOMMENDATIONS: Surgical consultation. Request for surgical consultation relayed to Al Pimple RN at [facility] by Electa Sniff RN on 09/24/2021. Pathology results reported by Electa Sniff RN on 09/25/2021. Electronically Signed   By: Valentino Saxon M.D.   On: 09/25/2021 12:43   Result Date: 09/25/2021 CLINICAL DATA:  Palpable area in the LEFT breast. 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 EXAM: ULTRASOUND GUIDED LEFT BREAST CORE NEEDLE BIOPSY x2 COMPARISON:  Previous exam(s). PROCEDURE: I met with the patient and we discussed the procedure of ultrasound-guided biopsy, including benefits and alternatives. We discussed the high likelihood of a successful procedure. We discussed the risks of the procedure, including infection, bleeding, tissue injury, clip migration, and inadequate sampling. Informed written consent was given. The usual time-out protocol was performed immediately prior to the procedure. Site 1: Isoechoic mass, 3:00 14cm FN Lesion quadrant: Upper outer quadrant Using sterile technique and 1% lidocaine and 1% lidocaine with epinephrine as local anesthetic, under direct ultrasound visualization, a 14 gauge spring-loaded device was used to perform biopsy of a mass at 3 o'clock 14 cm from the nipple using a lateral approach.  At the conclusion of the procedure a HEART tissue marker clip was deployed into the biopsy cavity. Follow up 2 view mammogram was performed and dictated separately. Site 2: Cystic and solid mass, 3:00 13cm FN Lesion quadrant: Upper outer quadrant Using sterile technique and 1% lidocaine and 1% lidocaine with epinephrine as local anesthetic, under direct ultrasound visualization, a 14 gauge spring-loaded device was used to perform biopsy of a mass at 3 o'clock 13 cm from the  nipple using a lateral approach. At the conclusion of the procedure a VENUS tissue marker clip was deployed into the biopsy cavity. Follow up 2 view mammogram was performed and dictated separately. IMPRESSION: Ultrasound guided biopsy of 2 adjacent breast masses at the lumpectomy bed. No apparent complications. Electronically Signed: By: Valentino Saxon M.D. On: 09/23/2021 09:13   Assessment and Plan:   HALLI EQUIHUA is a 75 y.o. y/o female with Patient of [facility] transferred care to [place].  Recent hospitalization for rectal bleeding.  EGD showed no abnormalities.  Colonoscopy could not be performed over 2 days since she could not tolerate the prep.  Last colonoscopy in 2019 showed sigmoid diverticulosis.  Very likely she could have had a diverticular bleed while on aspirin and Plavix.  Hemoglobin was stable during hospitalization.  Today they wish to transfer care to our practice.  No further bleeding.  She wishes to try and attempt with colonoscopy again.  She has some lower cramping abdominal pain probably could be an element of constipation we will commence her on fiber pills.  Would suggest close follow-up with her primary care physician for any interval symptoms  I have discussed alternative options, risks & benefits,  which include, but are not limited to, bleeding, infection, perforation,respiratory complication & drug reaction.  The patient agrees with this plan & written consent will be obtained.       Dr Jonathon Bellows  MD,MRCP [facility]) Follow up in as needed

## 2021-11-02 ENCOUNTER — Ambulatory Visit: Payer: Self-pay | Admitting: Surgery

## 2021-11-02 NOTE — H&P (View-Only) (Signed)
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 female who was referred by Dennison Nancy,* for evaluation of above. Change was noted on last mammogram. Patient does routinely do self breast exams.  Patient has noted a change on breast exam, prompting the most recent mammogram.   Complicated hx of previous breat CA in same spot, 2020.  In summary, underwent lumpectomy with SLNB, for grade I ER-positive, HER-2 negative mass.  LN negative, but had to undergo re-excision of margins x2.  Per patient and sons' report, radiation therapy afterwards was only briefly mentioned and lightly recommended so they declined.  Underwent hormonal therapy but had a lapse in treatment due to miscommunication, only completed 38months.   Per note from previous oncologist: Annual 3D mammogram at [facility] on November 04, 2018 as there was a nodule in the lateral aspect of the outer left breast that was 1.4 cm.    C. Left breast diagnostic mammogram and ultrasound on November 22, 2018 that showed a lobulated well-circumscribed mass in the upper outer aspect which increased in size from previous imaging and measured 1.4 cm on mammography with ultrasound showing a 1.3 cm hypoechoic mass and ultrasound of the axilla was negative   D. 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 Grade 1. ER positive (3+, 100%), PR positive (3+, 20%) and HER-2 negative (IHC 0)   E. No history of hormone replacement therapy and has no prior history of breast malignancy. Notable is a diagnosis of ovarian cancer in a niece in her 99s.   F. lumpectomy and sentinel lymph node evaluation on 01/16/2019 which did show invasive ductal carcinoma with mucinous features with a tumor size of 14 mm but there was a 8 mm focus in the left medial shave margin. 0 of 1 lymph nodes. pT1cN0. Overall grade 1.    F. reexcision of her medial margin on 05/10/2019 and this revealed in the final medial margin invasive ductal carcinoma with mucinous features measuring up to 7 mm and involving the final medial margin which was felt to represent an additional focus of invasive ductal carcinoma. Patient underwent a another reexcision and achieve negative margins on 05/31/2019.   G. Deferred radiation consultation. No adjuvant chemotherapy   H. Letrozole: 08/07/2019     IN addition to above, patient is having couple days worth of BRBPR, painless, accompained by some epigastric tenderness.  No associated N/V.  Tenderness is supposedly chronic.  It was difficult to obtain a good history from her since she was very anxious and unable to fully answer questions appropriately.      Past Medical History:  has a past medical history of History of cancer, Hypertension, Irregular heartbeat, and Thyroid disease.   Past Surgical History:  has a past surgical history that includes Hysterectomy; Cholecystectomy; and lymphnode surgery.   Family History: family history is not on file.   Social History:  reports that she has never smoked. She has never used smokeless tobacco. She reports that she does not drink alcohol and does not use drugs.   Current Medications: has a current medication list which includes the following prescription(s): acetaminophen, aspirin, atorvastatin, calcium carb/magnesium hydrox, cholecalciferol, clopidogrel, cyanocobalamin/cobamamide, levothyroxine, lorazepam, famotidine, fexofenadine, hydralazine, magnesium oxide, meclizine, metoprolol succinate, multivitamin with iron-minerals, ondansetron, pantoprazole, potassium chloride, and telmisartan.   Allergies:  Allergies as of 10/06/2021 - Reviewed 10/06/2021 Allergen  Reaction Noted  Latex Rash 04/25/2019  Lisinopril Cough 12/13/2018  Oxycodone Anxiety 07/10/2013  Procaine Other (See Comments) 07/10/2013  Diphenhydramine hcl Other (See  Comments) 01/09/2018  Clarithromycin Other (See Comments) 07/10/2013     ROS:  A 15 point review of systems was performed and was negative except as noted in HPI   Objective:   BP (!) 179/80    Pulse 75    Ht 167.6 cm ($RemoveB'5\' 6"'febVUJgv$ )    Wt 78.5 kg (173 lb)    BMI 27.92 kg/m    Constitutional :  No distress, cooperative, alert Lymphatics/Throat:  Supple with no lymphadenopathy Respiratory:  Clear to auscultation bilaterally Cardiovascular:  Regular rate and rhythm Gastrointestinal: Soft, non-tender, non-distended, no organomegaly. Musculoskeletal: Steady gait and movement Skin: Cool and moist, no surgical scars Psychiatric: Normal affect, non-agitated, not confused Breast: Chaperone present for exam and noted to have palpable area of concern on left breast with overlying bruising consistent with recent biopsy     LABS:  Reason for Addendum #1:  Breast Biomarker Results   Specimen Submitted:  A. Breast, left, 3 o'clock, 14 CMFN, heart clip; biopsy  B. Breast, left, 3 o'clock, 13 CMFN, venus clip; biopsy   Clinical History: Left breast cancer was found in 2020 and treated at  [facility] in [place].  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  Two additional re-excisions were required to achieve a negative final  medial margin.  She now has palpable abnormality in region of previous surgery, with  multiple masses spanning almost 8 cm on mammograms.  Two areas of concern were recommended for ultrasound biopsy:  isoechoic  mass at site 1 and dominant cystic/solid mass at site 2.  Post-biopsy mammograms show appropriate placement of the heart shaped  clip at site 1 and the Venus-shaped clip at site 2.      DIAGNOSIS:  A. BREAST, LEFT, 3 O'CLOCK, 14 CM FROM NIPPLE (HEART CLIP);  ULTRASOUND-GUIDED CORE BIOPSY:  - INVASIVE MAMMARY CARCINOMA WITH  MUCINOUS FEATURES.  Size of invasive carcinoma:  8 mm in this sample  Histologic grade of invasive carcinoma: Grade 1                       Glandular/tubular differentiation score: 2                       Nuclear pleomorphism score: 2                       Mitotic rate score: 1                       Total score: 5  Ductal carcinoma in situ: Not identified  Lymphovascular invasion: Not identified   B. BREAST, LEFT, 3 O'CLOCK, 13 CM FROM NIPPLE (VENUS CLIP);  ULTRASOUND-GUIDED CORE BIOPSY:  - INVASIVE MAMMARY CARCINOMA WITH MUCINOUS FEATURES.  Size of invasive carcinoma:  9 mm in this sample  Histologic grade of invasive carcinoma: Grade 1                       Glandular/tubular differentiation score: 2                       Nuclear pleomorphism score: 2  Mitotic rate score: 1                       Total score: 5  Ductal carcinoma in situ: Not identified  Lymphovascular invasion: Not identified   Comment:  The tumor has similar appearances in A and B, with more cystic features  in the B sample.  The histologic grade and mucinous features match the previously excised  left breast multifocal carcinoma, based on review of the pathology  reports from 2020:  [facility] cases [phone] (core biopsy), [phone] (initial  excision), [id number] (re-excision), and [phone] (second  re-excision).   ER/PR/HER2: Immunohistochemistry will be performed on block A1, with  reflex to [place] for HER2 2+. The results will be reported in an addendum.   GROSS DESCRIPTION:  A. Labeled: Left breast 3:00 14 cm from the nipple  Received: Formalin  Time/date in fixative: Collected and placed in formalin at 8:40 AM on  09/23/2021  Cold ischemic time: Less than 1 minute  Total fixation time: Approximately 8.6 hours  Core pieces: 4  Size: Ranges from 1.0-1.4 cm in length and 0.1 cm in diameter  Description: Tan-yellow fibrofatty cores of tissue  Ink color: Blue   Entirely submitted in 2 cassettes with 2 cores in each.   B. Labeled: Left breast 3:00 13 cm from the nipple  Received: Formalin  Time/date in fixative: Collected and placed in formalin at 8:44 AM on  09/23/2021  Cold ischemic time: Less than 1 minute  Total fixation time: Approximately 8.5 hours  Core pieces: 4  Size: Ranges from 0.5-1.2 cm in length and 0.1 cm in diameter  Description: Received are tan cores of soft tissue with additional  smaller fragments of tissue, 0.8 x 0.2 x 0.1 cm in aggregate.  Ink color: Black  Entirely submitted in 3 cassettes with 3 cores in B1, 1 core and  additional fragments in B2, and remaining fragments in B3.   [facility] 09/23/2021   Final Diagnosis performed by Bryan Lemma, MD.   Electronically signed  09/24/2021 2:16:34PM  The electronic signature indicates that the named Attending Pathologist  has evaluated the specimen  Technical component performed at [facility], [address], [place],  [place] [address] Lab: [phone] Dir: Rush Farmer, MD, MMM   Professional component performed at [facility], [facility], [place], [place], [place] [address] Lab: [phone]  Dir: Kathi Simpers, MD   ADDENDUM:  CASE SUMMARY: BREAST BIOMARKER TESTS  Estrogen Receptor (ER) Status: POSITIVE          Percentage of cells with nuclear positivity: Greater than 90%          Average intensity of staining: Strong   Progesterone Receptor (PgR) Status: POSITIVE          Percentage of cells with nuclear positivity: 1-10%          Average intensity of staining: Weak   HER2 (by immunohistochemistry): NEGATIVE (Score 1+)  Ki-67: Not performed   Cold Ischemia and Fixation Times: Meet requirements specified in latest  version of the ASCO/CAP guidelines  Testing Performed on Block Number(s): A1   METHODS  Fixative: Formalin  Estrogen Receptor:  FDA cleared ([place]) Primary Antibody:  SP1  Progesterone Receptor: FDA cleared ([place]) Primary  Antibody: 1E2  HER2 (by IHC): FDA approved ([place]) Primary Antibody: 4B5 ([facility])  Immunohistochemistry controls worked appropriately. Slides were prepared  by [facility], [place], [place], and interpreted by Dr. Dicie Beam.  ([id number])  Addendum #1 performed by Bryan Lemma, MD.   Electronically signed  10/01/2021 3:55:44PM  The electronic signature indicates that the named Attending Pathologist  has evaluated the specimen  Technical component performed at [facility], [address] [place],  [place] [address] Lab: [phone] Dir: Rush Farmer, MD, MMM   Professional component performed at [facility], [facility], [place], [place], [place] [address] Lab: [phone]  Dir: Kathi Simpers, MD   RADS: This result has an attachment that is not available.  CLINICAL DATA:  Palpable area in the LEFT breast. History of a LEFT  lumpectomy for IDC with mucinous features per [facility] in  2020.   EXAM:  ULTRASOUND GUIDED LEFT BREAST CORE NEEDLE BIOPSY x2   COMPARISON:  Previous exam(s).   PROCEDURE:  I met with the patient and we discussed the procedure of  ultrasound-guided biopsy, including benefits and alternatives. We  discussed the high likelihood of a successful procedure. We  discussed the risks of the procedure, including infection, bleeding,  tissue injury, clip migration, and inadequate sampling. Informed  written consent was given. The usual time-out protocol was performed  immediately prior to the procedure.   Site 1: Isoechoic mass, 3:00 14cm FN   Lesion quadrant: Upper outer quadrant   Using sterile technique and 1% lidocaine and 1% lidocaine with  epinephrine as local anesthetic, under direct ultrasound  visualization, a 14 gauge spring-loaded device was used to perform  biopsy of a mass at 3 o'clock 14 cm from the nipple using a lateral  approach. At the conclusion of the procedure a HEART tissue marker  clip was deployed into the  biopsy cavity. Follow up 2 view mammogram  was performed and dictated separately.   Site 2: Cystic and solid mass, 3:00 13cm FN   Lesion quadrant: Upper outer quadrant   Using sterile technique and 1% lidocaine and 1% lidocaine with  epinephrine as local anesthetic, under direct ultrasound  visualization, a 14 gauge spring-loaded device was used to perform  biopsy of a mass at 3 o'clock 13 cm from the nipple using a lateral  approach. At the conclusion of the procedure a VENUS tissue marker  clip was deployed into the biopsy cavity. Follow up 2 view mammogram  was performed and dictated separately.   IMPRESSION:  Ultrasound guided biopsy of 2 adjacent breast masses at the  lumpectomy bed. No apparent complications.   Electronically Signed:  By: Valentino Saxon M.D.  On: 09/23/2021 09:13   CLINICAL DATA:  Mass felt by the patient in the outer left breast  for the past 3 months at the site of a previous excisional biopsy  performed 2019.   EXAM:  DIGITAL DIAGNOSTIC BILATERAL MAMMOGRAM WITH TOMOSYNTHESIS AND CAD;  ULTRASOUND LEFT BREAST LIMITED   TECHNIQUE:  Bilateral digital diagnostic mammography and breast tomosynthesis  was performed. The images were evaluated with computer-aided  detection.; Targeted ultrasound examination of the left breast was  performed.   COMPARISON:  Previous exam(s).   ACR Breast Density Category b: There are scattered areas of  fibroglandular density.   FINDINGS:  Since the patient's most recent examination dated 10/11/2019 at [facility], there has been interval development of multiple oval,  rounded and lobulated masses in the posterior outer left breast at  the location of multiple surgical clips. Some of these have  relatively low density suggesting oil cysts. Others are higher in  density. The largest measures 3 cm in maximum diameter. This is at  the location of the palpable mass, marked with a metallic marker.  The masses span  an area measuring 7.9 x 5.3 x 2.8 cm.   No abnormal appearing left axillary lymph nodes are visualized. The  right breast continues to have a normal appearance.   On physical exam, the patient has an approximately 2.5 cm oval,  mildly protuberant palpable mass in the 3 o'clock position of the  left breast, 13 cm from the nipple.   Targeted ultrasound is performed, showing a 2.6 x 1.8 x 1.3 cm oval,  horizontally oriented, circumscribed mass in the 3 o'clock position  of the left breast, 13 cm from the nipple. This has cystic and solid  appearing components. The solid appearing component has an irregular  mass-like appearance with internal blood flow with power Doppler.  This component measures 1.5 x 1.2 x 0.7 cm in maximum dimensions.   There is an adjacent, bilobed, medium echotexture mass slightly more  laterally. This mass measures 1.1 x 1.1 x 0.6 cm. No internal blood  flow was seen with power Doppler.   Multiple additional smaller, rounded, predominantly cystic masses  are demonstrated, some with mild diffuse wall thickening.   Ultrasound of the left axilla demonstrated normal appearing left  axillary lymph nodes   IMPRESSION:  1. Interval multiple masses in the posterior outer left breast at  the location of previous surgery, as described above. These could  all represent areas of fat necrosis and oil cyst formation. However,  2 of these are indeterminate including a 2.6 cm cystic and solid  mass in 3 o'clock position with ultrasound findings suspicious for  the possibility of a papillary lesion and a 1.1 cm adjacent bilobed  mass more laterally which could be solid.  2. No evidence of malignancy elsewhere in either breast and no left  axillary adenopathy.   RECOMMENDATION:  Ultrasound-guided core needle biopsies of the 2.6 cm and 1.1 cm  masses in the 3 o'clock position of the left breast. This has been  discussed with the patient and her son. She will be assisted in   getting these scheduled.   I have discussed the findings and recommendations with the patient.  If applicable, a reminder letter will be sent to the patient  regarding the next appointment.   BI-RADS CATEGORY  4: Suspicious.       Assessment:   Recurrent breast cancer, left (CMS-HCC) [C50.912] BRBPR   Plan:   1. Recurrent breast cancer, left (CMS-HCC) [X51.700] Complicated hx of previous treatment.  Pt and sons in room overwhelmed with new dx since this was first time hearing of recent biopsy results.  I briefly discussed treatment options including recommending left mastectomy, attempt at [facility], with possible adjuvant nodal irradiation regardling of SLNB results.  Will need to discuss case at tumor board prior to finalizing plans.  She will be seeing oncology later this am.   BRBPR- recommend repeat labs and monitor for now since the amount reported does not seem significant and she has hx of anxiety driven epigastric pain for which she is supposed to be taking daily PPIs but couldn't state if she has been compliant.  Will monitor closely.  Rectal deferred today due to associated anxiety with breast CA diagnosis.

## 2021-11-02 NOTE — H&P (Signed)
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 female who was referred by Dennison Nancy,* for evaluation of above. Change was noted on last mammogram. Patient does routinely do self breast exams.  Patient has noted a change on breast exam, prompting the most recent mammogram.   Complicated hx of previous breat CA in same spot, 2020.  In summary, underwent lumpectomy with SLNB, for grade I ER-positive, HER-2 negative mass.  LN negative, but had to undergo re-excision of margins x2.  Per patient and sons' report, radiation therapy afterwards was only briefly mentioned and lightly recommended so they declined.  Underwent hormonal therapy but had a lapse in treatment due to miscommunication, only completed 83months.   Per note from previous oncologist: Annual 3D mammogram at [facility] on November 04, 2018 as there was a nodule in the lateral aspect of the outer left breast that was 1.4 cm.    C. Left breast diagnostic mammogram and ultrasound on November 22, 2018 that showed a lobulated well-circumscribed mass in the upper outer aspect which increased in size from previous imaging and measured 1.4 cm on mammography with ultrasound showing a 1.3 cm hypoechoic mass and ultrasound of the axilla was negative   D. 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 Grade 1. ER positive (3+, 100%), PR positive (3+, 20%) and HER-2 negative (IHC 0)   E. No history of hormone replacement therapy and has no prior history of breast malignancy. Notable is a diagnosis of ovarian cancer in a niece in her 69s.   F. lumpectomy and sentinel lymph node evaluation on 01/16/2019 which did show invasive ductal carcinoma with mucinous features with a tumor size of 14 mm but there was a 8 mm focus in the left medial shave margin. 0 of 1 lymph nodes. pT1cN0. Overall grade 1.    F. reexcision of her medial margin on 05/10/2019 and this revealed in the final medial margin invasive ductal carcinoma with mucinous features measuring up to 7 mm and involving the final medial margin which was felt to represent an additional focus of invasive ductal carcinoma. Patient underwent a another reexcision and achieve negative margins on 05/31/2019.   G. Deferred radiation consultation. No adjuvant chemotherapy   H. Letrozole: 08/07/2019     IN addition to above, patient is having couple days worth of BRBPR, painless, accompained by some epigastric tenderness.  No associated N/V.  Tenderness is supposedly chronic.  It was difficult to obtain a good history from her since she was very anxious and unable to fully answer questions appropriately.      Past Medical History:  has a past medical history of History of cancer, Hypertension, Irregular heartbeat, and Thyroid disease.   Past Surgical History:  has a past surgical history that includes Hysterectomy; Cholecystectomy; and lymphnode surgery.   Family History: family history is not on file.   Social History:  reports that she has never smoked. She has never used smokeless tobacco. She reports that she does not drink alcohol and does not use drugs.   Current Medications: has a current medication list which includes the following prescription(s): acetaminophen, aspirin, atorvastatin, calcium carb/magnesium hydrox, cholecalciferol, clopidogrel, cyanocobalamin/cobamamide, levothyroxine, lorazepam, famotidine, fexofenadine, hydralazine, magnesium oxide, meclizine, metoprolol succinate, multivitamin with iron-minerals, ondansetron, pantoprazole, potassium chloride, and telmisartan.   Allergies:  Allergies as of 10/06/2021 - Reviewed 10/06/2021 Allergen  Reaction Noted  Latex Rash 04/25/2019  Lisinopril Cough 12/13/2018  Oxycodone Anxiety 07/10/2013  Procaine Other (See Comments) 07/10/2013  Diphenhydramine hcl Other (See  Comments) 01/09/2018  Clarithromycin Other (See Comments) 07/10/2013     ROS:  A 15 point review of systems was performed and was negative except as noted in HPI   Objective:   BP (!) 179/80   Pulse 75   Ht 167.6 cm ($RemoveB'5\' 6"'dufWcTxw$ )   Wt 78.5 kg (173 lb)   BMI 27.92 kg/m    Constitutional :  No distress, cooperative, alert Lymphatics/Throat:  Supple with no lymphadenopathy Respiratory:  Clear to auscultation bilaterally Cardiovascular:  Regular rate and rhythm Gastrointestinal: Soft, non-tender, non-distended, no organomegaly. Musculoskeletal: Steady gait and movement Skin: Cool and moist, no surgical scars Psychiatric: Normal affect, non-agitated, not confused Breast: Chaperone present for exam and noted to have palpable area of concern on left breast with overlying bruising consistent with recent biopsy     LABS:  Reason for Addendum #1:  Breast Biomarker Results   Specimen Submitted:  A. Breast, left, 3 o'clock, 14 CMFN, heart clip; biopsy  B. Breast, left, 3 o'clock, 13 CMFN, venus clip; biopsy   Clinical History: Left breast cancer was found in 2020 and treated at  [facility] in [place].  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  Two additional re-excisions were required to achieve a negative final  medial margin.  She now has palpable abnormality in region of previous surgery, with  multiple masses spanning almost 8 cm on mammograms.  Two areas of concern were recommended for ultrasound biopsy:  isoechoic  mass at site 1 and dominant cystic/solid mass at site 2.  Post-biopsy mammograms show appropriate placement of the heart shaped  clip at site 1 and the Venus-shaped clip at site 2.      DIAGNOSIS:  A. BREAST, LEFT, 3 O'CLOCK, 14 CM FROM NIPPLE (HEART CLIP);  ULTRASOUND-GUIDED CORE BIOPSY:  - INVASIVE MAMMARY CARCINOMA WITH  MUCINOUS FEATURES.  Size of invasive carcinoma:  8 mm in this sample  Histologic grade of invasive carcinoma: Grade 1                       Glandular/tubular differentiation score: 2                       Nuclear pleomorphism score: 2                       Mitotic rate score: 1                       Total score: 5  Ductal carcinoma in situ: Not identified  Lymphovascular invasion: Not identified   B. BREAST, LEFT, 3 O'CLOCK, 13 CM FROM NIPPLE (VENUS CLIP);  ULTRASOUND-GUIDED CORE BIOPSY:  - INVASIVE MAMMARY CARCINOMA WITH MUCINOUS FEATURES.  Size of invasive carcinoma:  9 mm in this sample  Histologic grade of invasive carcinoma: Grade 1                       Glandular/tubular differentiation score: 2                       Nuclear pleomorphism score: 2  Mitotic rate score: 1                       Total score: 5  Ductal carcinoma in situ: Not identified  Lymphovascular invasion: Not identified   Comment:  The tumor has similar appearances in A and B, with more cystic features  in the B sample.  The histologic grade and mucinous features match the previously excised  left breast multifocal carcinoma, based on review of the pathology  reports from 2020:  [facility] cases [id number] (core biopsy), [phone] (initial  excision), [phone] (re-excision), and [phone] (second  re-excision).   ER/PR/HER2: Immunohistochemistry will be performed on block A1, with  reflex to [place] for HER2 2+. The results will be reported in an addendum.   GROSS DESCRIPTION:  A. Labeled: Left breast 3:00 14 cm from the nipple  Received: Formalin  Time/date in fixative: Collected and placed in formalin at 8:40 AM on  09/23/2021  Cold ischemic time: Less than 1 minute  Total fixation time: Approximately 8.6 hours  Core pieces: 4  Size: Ranges from 1.0-1.4 cm in length and 0.1 cm in diameter  Description: Tan-yellow fibrofatty cores of tissue  Ink color: Blue   Entirely submitted in 2 cassettes with 2 cores in each.   B. Labeled: Left breast 3:00 13 cm from the nipple  Received: Formalin  Time/date in fixative: Collected and placed in formalin at 8:44 AM on  09/23/2021  Cold ischemic time: Less than 1 minute  Total fixation time: Approximately 8.5 hours  Core pieces: 4  Size: Ranges from 0.5-1.2 cm in length and 0.1 cm in diameter  Description: Received are tan cores of soft tissue with additional  smaller fragments of tissue, 0.8 x 0.2 x 0.1 cm in aggregate.  Ink color: Black  Entirely submitted in 3 cassettes with 3 cores in B1, 1 core and  additional fragments in B2, and remaining fragments in B3.   [facility] 09/23/2021   Final Diagnosis performed by Bryan Lemma, MD.   Electronically signed  09/24/2021 2:16:34PM  The electronic signature indicates that the named Attending Pathologist  has evaluated the specimen  Technical component performed at [facility], [address] [place],  [place] [address] Lab: [phone] Dir: Rush Farmer, MD, MMM   Professional component performed at [facility], [facility], [place], [place], [place] [address] Lab: [phone]  Dir: Kathi Simpers, MD   ADDENDUM:  CASE SUMMARY: BREAST BIOMARKER TESTS  Estrogen Receptor (ER) Status: POSITIVE          Percentage of cells with nuclear positivity: Greater than 90%          Average intensity of staining: Strong   Progesterone Receptor (PgR) Status: POSITIVE          Percentage of cells with nuclear positivity: 1-10%          Average intensity of staining: Weak   HER2 (by immunohistochemistry): NEGATIVE (Score 1+)  Ki-67: Not performed   Cold Ischemia and Fixation Times: Meet requirements specified in latest  version of the ASCO/CAP guidelines  Testing Performed on Block Number(s): A1   METHODS  Fixative: Formalin  Estrogen Receptor:  FDA cleared ([place]) Primary Antibody:  SP1  Progesterone Receptor: FDA cleared ([place]) Primary  Antibody: 1E2  HER2 (by IHC): FDA approved ([place]) Primary Antibody: 4B5 ([facility])  Immunohistochemistry controls worked appropriately. Slides were prepared  by [facility], [place], [place], and interpreted by Dr. Dicie Beam.  (v1.5.0.0)  Addendum #1 performed by Bryan Lemma, MD.   Electronically signed  10/01/2021 3:55:44PM  The electronic signature indicates that the named Attending Pathologist  has evaluated the specimen  Technical component performed at [place], [address], [place],  [place] [address] Lab: [phone] Dir: Rush Farmer, MD, MMM   Professional component performed at [facility], [place], [place], [place] [address] Lab: [phone]  Dir: Kathi Simpers, MD   RADS: This result has an attachment that is not available.  CLINICAL DATA:  Palpable area in the LEFT breast. History of a LEFT  lumpectomy for IDC with mucinous features per [facility] in  2020.   EXAM:  ULTRASOUND GUIDED LEFT BREAST CORE NEEDLE BIOPSY x2   COMPARISON:  Previous exam(s).   PROCEDURE:  I met with the patient and we discussed the procedure of  ultrasound-guided biopsy, including benefits and alternatives. We  discussed the high likelihood of a successful procedure. We  discussed the risks of the procedure, including infection, bleeding,  tissue injury, clip migration, and inadequate sampling. Informed  written consent was given. The usual time-out protocol was performed  immediately prior to the procedure.   Site 1: Isoechoic mass, 3:00 14cm FN   Lesion quadrant: Upper outer quadrant   Using sterile technique and 1% lidocaine and 1% lidocaine with  epinephrine as local anesthetic, under direct ultrasound  visualization, a 14 gauge spring-loaded device was used to perform  biopsy of a mass at 3 o'clock 14 cm from the nipple using a lateral  approach. At the conclusion of the procedure a HEART tissue marker  clip was deployed into the  biopsy cavity. Follow up 2 view mammogram  was performed and dictated separately.   Site 2: Cystic and solid mass, 3:00 13cm FN   Lesion quadrant: Upper outer quadrant   Using sterile technique and 1% lidocaine and 1% lidocaine with  epinephrine as local anesthetic, under direct ultrasound  visualization, a 14 gauge spring-loaded device was used to perform  biopsy of a mass at 3 o'clock 13 cm from the nipple using a lateral  approach. At the conclusion of the procedure a VENUS tissue marker  clip was deployed into the biopsy cavity. Follow up 2 view mammogram  was performed and dictated separately.   IMPRESSION:  Ultrasound guided biopsy of 2 adjacent breast masses at the  lumpectomy bed. No apparent complications.   Electronically Signed:  By: Valentino Saxon M.D.  On: 09/23/2021 09:13   CLINICAL DATA:  Mass felt by the patient in the outer left breast  for the past 3 months at the site of a previous excisional biopsy  performed 2019.   EXAM:  DIGITAL DIAGNOSTIC BILATERAL MAMMOGRAM WITH TOMOSYNTHESIS AND CAD;  ULTRASOUND LEFT BREAST LIMITED   TECHNIQUE:  Bilateral digital diagnostic mammography and breast tomosynthesis  was performed. The images were evaluated with computer-aided  detection.; Targeted ultrasound examination of the left breast was  performed.   COMPARISON:  Previous exam(s).   ACR Breast Density Category b: There are scattered areas of  fibroglandular density.   FINDINGS:  Since the patient's most recent examination dated 10/11/2019 at [facility], there has been interval development of multiple oval,  rounded and lobulated masses in the posterior outer left breast at  the location of multiple surgical clips. Some of these have  relatively low density suggesting oil cysts. Others are higher in  density. The largest measures 3 cm in maximum diameter. This is at  the location of the palpable mass, marked with a metallic marker.  The masses span  an area measuring 7.9 x 5.3 x 2.8 cm.   No abnormal appearing left axillary lymph nodes are visualized. The  right breast continues to have a normal appearance.   On physical exam, the patient has an approximately 2.5 cm oval,  mildly protuberant palpable mass in the 3 o'clock position of the  left breast, 13 cm from the nipple.   Targeted ultrasound is performed, showing a 2.6 x 1.8 x 1.3 cm oval,  horizontally oriented, circumscribed mass in the 3 o'clock position  of the left breast, 13 cm from the nipple. This has cystic and solid  appearing components. The solid appearing component has an irregular  mass-like appearance with internal blood flow with power Doppler.  This component measures 1.5 x 1.2 x 0.7 cm in maximum dimensions.   There is an adjacent, bilobed, medium echotexture mass slightly more  laterally. This mass measures 1.1 x 1.1 x 0.6 cm. No internal blood  flow was seen with power Doppler.   Multiple additional smaller, rounded, predominantly cystic masses  are demonstrated, some with mild diffuse wall thickening.   Ultrasound of the left axilla demonstrated normal appearing left  axillary lymph nodes   IMPRESSION:  1. Interval multiple masses in the posterior outer left breast at  the location of previous surgery, as described above. These could  all represent areas of fat necrosis and oil cyst formation. However,  2 of these are indeterminate including a 2.6 cm cystic and solid  mass in 3 o'clock position with ultrasound findings suspicious for  the possibility of a papillary lesion and a 1.1 cm adjacent bilobed  mass more laterally which could be solid.  2. No evidence of malignancy elsewhere in either breast and no left  axillary adenopathy.   RECOMMENDATION:  Ultrasound-guided core needle biopsies of the 2.6 cm and 1.1 cm  masses in the 3 o'clock position of the left breast. This has been  discussed with the patient and her son. She will be assisted in   getting these scheduled.   I have discussed the findings and recommendations with the patient.  If applicable, a reminder letter will be sent to the patient  regarding the next appointment.   BI-RADS CATEGORY  4: Suspicious.       Assessment:   Recurrent breast cancer, left (CMS-HCC) [C50.912] BRBPR   Plan:   1. Recurrent breast cancer, left (CMS-HCC) [E10.071] Complicated hx of previous treatment.  Pt and sons in room overwhelmed with new dx since this was first time hearing of recent biopsy results.  I briefly discussed treatment options including recommending left mastectomy, attempt at [facility], with possible adjuvant nodal irradiation regardling of SLNB results.  Will need to discuss case at tumor board prior to finalizing plans.  She will be seeing oncology later this am.   BRBPR- recommend repeat labs and monitor for now since the amount reported does not seem significant and she has hx of anxiety driven epigastric pain for which she is supposed to be taking daily PPIs but couldn't state if she has been compliant.  Will monitor closely.  Rectal deferred today due to associated anxiety with breast CA diagnosis.

## 2021-11-05 NOTE — Anesthesia Postprocedure Evaluation (Signed)
Anesthesia Post Note  Patient: Cindy Huerta  Procedure(s) Performed: ESOPHAGOGASTRODUODENOSCOPY (EGD) WITH PROPOFOL  Patient location during evaluation: PACU Anesthesia Type: General Level of consciousness: awake and awake and alert Pain management: satisfactory to patient Vital Signs Assessment: post-procedure vital signs reviewed and stable Respiratory status: spontaneous breathing and respiratory function stable Cardiovascular status: blood pressure returned to baseline Anesthetic complications: no   No notable events documented.   Last Vitals:  Vitals:   10/10/21 0332 10/10/21 0749  BP: (!) 188/77 (!) 152/69  Pulse: 85 95  Resp: 20 18  Temp: 37.3 C 37.3 C  SpO2: 98% 100%    Last Pain:  Vitals:   10/10/21 0749  TempSrc: Oral  PainSc: 0-No pain                 VAN STAVEREN,Luvern Mischke

## 2021-11-11 ENCOUNTER — Other Ambulatory Visit: Payer: Self-pay

## 2021-11-11 ENCOUNTER — Other Ambulatory Visit
Admission: RE | Admit: 2021-11-11 | Discharge: 2021-11-11 | Disposition: A | Discharge status: Home / Self Care | Payer: Medicare PPO | Source: Ambulatory Visit | Attending: Surgery | Admitting: Surgery

## 2021-11-11 HISTORY — DX: Gastro-esophageal reflux disease without esophagitis: K21.9

## 2021-11-11 HISTORY — DX: Unspecified osteoarthritis, unspecified site: M19.90

## 2021-11-11 NOTE — Patient Instructions (Addendum)
Your procedure is scheduled on: Friday November 20, 2021. Report to Day Surgery inside [facility] 2nd floor, stop by admissions desk before getting on elevator. To find out your arrival time please call [phone] between 1PM - 3PM on Thursday November 19, 2021.  Remember: Instructions that are not followed completely may result in serious medical risk,  up to and including death, or upon the discretion of your surgeon and anesthesiologist your  surgery may need to be rescheduled.     _X__ 1. Do not eat food after midnight the night before your procedure.                 No chewing gum or hard candies. You may drink clear liquids up to 2 hours                 before you are scheduled to arrive for your surgery- DO not drink clear                 liquids within 2 hours of the start of your surgery.                 Clear Liquids include:  water, apple juice without pulp, clear Gatorade, G2 or                  Gatorade Zero (avoid Red/Purple/Blue), Black Coffee or Tea (Do not add                 anything to coffee or tea).  __X__2.  On the morning of surgery brush your teeth with toothpaste and water, you                may rinse your mouth with mouthwash if you wish.  Do not swallow any toothpaste or mouthwash.     _X__ 3.  No Alcohol for 24 hours before or after surgery.   _X__ 4.  Do Not Smoke or use e-cigarettes For 24 Hours Prior to Your Surgery.                 Do not use any chewable tobacco products for at least 6 hours prior to                 Surgery.  _X__  5.  Do not use any recreational drugs (marijuana, cocaine, heroin, ecstasy, MDMA or other)                For at least one week prior to your surgery.  Combination of these drugs with anesthesia                May have life threatening results.  __X__6.  Notify your doctor if there is any change in your medical condition      (cold, fever, infections).     Do not wear jewelry, make-up, hairpins,  clips or nail polish. Do not wear lotions, powders, or perfumes or deodorant. Do not shave 48 hours prior to surgery.  Do not bring valuables to the hospital.    [facility] is not responsible for any belongings or valuables.  Contacts, dentures or bridgework may not be worn into surgery. Leave your suitcase in the car. After surgery it may be brought to your room. For patients admitted to the hospital, discharge time is determined by your treatment team.   Patients discharged the day of surgery will not be allowed to drive home.   Make arrangements for someone to be  with you for the first 24 hours of your Same Day Discharge.   __X__ Take these medicines the morning of surgery with A SIP OF WATER:    1. metoprolol succinate (TOPROL-XL) 50 MG  2. pantoprazole (PROTONIX) 40 MG   3. levothyroxine (SYNTHROID) 75 MCG  4. hydrALAZINE (APRESOLINE) 25 MG   5.  6.  ____ Fleet Enema (as directed)   __X__ Use Antibacterial Soap (or wipes) as directed  ____ Use Benzoyl Peroxide Gel as instructed  ____ Use inhalers on the day of surgery  ____ Stop metformin 2 days prior to surgery    ____ Take 1/2 of usual insulin dose the night before surgery. No insulin the morning          of surgery.   __X__ Stop aspirin 81 mg Friday 11/13/21 as instructed by your doctor.  __X__ One Week prior to surgery- Stop Anti-inflammatories such as Ibuprofen, Aleve, Advil, Motrin, meloxicam (MOBIC), diclofenac, etodolac, ketorolac, Toradol, Daypro, piroxicam, Goody's or BC powders. OK TO USE TYLENOL IF NEEDED   __X__ Stop supplements until after surgery.    ____ Bring C-Pap to the hospital.    If you have any questions regarding your pre-procedure instructions,  Please call Pre-admit Testing at [phone]

## 2021-11-18 ENCOUNTER — Other Ambulatory Visit: Payer: Self-pay | Admitting: Surgery

## 2021-11-18 DIAGNOSIS — C50912 Malignant neoplasm of unspecified site of left female breast: Secondary | ICD-10-CM

## 2021-11-20 ENCOUNTER — Encounter
Admission: AD | Disposition: A | Discharge status: Home / Self Care | Payer: Self-pay | Source: Home / Self Care | Attending: Surgery

## 2021-11-20 ENCOUNTER — Other Ambulatory Visit: Payer: Self-pay

## 2021-11-20 ENCOUNTER — Ambulatory Visit: Payer: Medicare PPO | Admitting: Certified Registered Nurse Anesthetist

## 2021-11-20 ENCOUNTER — Encounter: Payer: Self-pay | Admitting: Surgery

## 2021-11-20 ENCOUNTER — Ambulatory Visit
Admission: RE | Admit: 2021-11-20 | Discharge: 2021-11-20 | Disposition: A | Discharge status: Home / Self Care | Payer: Medicare PPO | Source: Ambulatory Visit | Attending: Surgery | Admitting: Surgery

## 2021-11-20 ENCOUNTER — Inpatient Hospital Stay
Admission: AD | Admit: 2021-11-20 | Discharge: 2021-11-25 | DRG: 580 | Disposition: A | Discharge status: Home Health | Payer: Medicare PPO | Attending: Surgery | Admitting: Surgery

## 2021-11-20 ENCOUNTER — Observation Stay
Admission: RE | Admit: 2021-11-20 | Discharge: 2021-11-20 | Disposition: A | Discharge status: Home / Self Care | Payer: Medicare PPO | Source: Ambulatory Visit | Attending: Surgery | Admitting: Surgery

## 2021-11-20 DIAGNOSIS — Z66 Do not resuscitate: Secondary | ICD-10-CM | POA: Diagnosis not present

## 2021-11-20 DIAGNOSIS — C50919 Malignant neoplasm of unspecified site of unspecified female breast: Secondary | ICD-10-CM | POA: Diagnosis present

## 2021-11-20 DIAGNOSIS — I10 Essential (primary) hypertension: Secondary | ICD-10-CM | POA: Diagnosis present

## 2021-11-20 DIAGNOSIS — I951 Orthostatic hypotension: Secondary | ICD-10-CM | POA: Diagnosis present

## 2021-11-20 DIAGNOSIS — Z881 Allergy status to other antibiotic agents status: Secondary | ICD-10-CM

## 2021-11-20 DIAGNOSIS — L7622 Postprocedural hemorrhage and hematoma of skin and subcutaneous tissue following other procedure: Secondary | ICD-10-CM | POA: Diagnosis not present

## 2021-11-20 DIAGNOSIS — Z8673 Personal history of transient ischemic attack (TIA), and cerebral infarction without residual deficits: Secondary | ICD-10-CM

## 2021-11-20 DIAGNOSIS — C50912 Malignant neoplasm of unspecified site of left female breast: Secondary | ICD-10-CM

## 2021-11-20 DIAGNOSIS — Z20822 Contact with and (suspected) exposure to covid-19: Secondary | ICD-10-CM | POA: Diagnosis present

## 2021-11-20 DIAGNOSIS — Z79899 Other long term (current) drug therapy: Secondary | ICD-10-CM

## 2021-11-20 DIAGNOSIS — Z90710 Acquired absence of both cervix and uterus: Secondary | ICD-10-CM

## 2021-11-20 DIAGNOSIS — F419 Anxiety disorder, unspecified: Secondary | ICD-10-CM | POA: Diagnosis present

## 2021-11-20 DIAGNOSIS — N183 Chronic kidney disease, stage 3 unspecified: Secondary | ICD-10-CM | POA: Diagnosis present

## 2021-11-20 DIAGNOSIS — E785 Hyperlipidemia, unspecified: Secondary | ICD-10-CM | POA: Diagnosis present

## 2021-11-20 DIAGNOSIS — Z885 Allergy status to narcotic agent status: Secondary | ICD-10-CM

## 2021-11-20 DIAGNOSIS — F32A Depression, unspecified: Secondary | ICD-10-CM | POA: Diagnosis present

## 2021-11-20 DIAGNOSIS — Z8249 Family history of ischemic heart disease and other diseases of the circulatory system: Secondary | ICD-10-CM

## 2021-11-20 DIAGNOSIS — I129 Hypertensive chronic kidney disease with stage 1 through stage 4 chronic kidney disease, or unspecified chronic kidney disease: Secondary | ICD-10-CM | POA: Diagnosis present

## 2021-11-20 DIAGNOSIS — Z888 Allergy status to other drugs, medicaments and biological substances status: Secondary | ICD-10-CM

## 2021-11-20 DIAGNOSIS — M79622 Pain in left upper arm: Secondary | ICD-10-CM | POA: Diagnosis present

## 2021-11-20 DIAGNOSIS — Y838 Other surgical procedures as the cause of abnormal reaction of the patient, or of later complication, without mention of misadventure at the time of the procedure: Secondary | ICD-10-CM | POA: Diagnosis not present

## 2021-11-20 DIAGNOSIS — R42 Dizziness and giddiness: Secondary | ICD-10-CM | POA: Diagnosis not present

## 2021-11-20 DIAGNOSIS — Z91040 Latex allergy status: Secondary | ICD-10-CM

## 2021-11-20 DIAGNOSIS — Z170 Estrogen receptor positive status [ER+]: Secondary | ICD-10-CM

## 2021-11-20 DIAGNOSIS — N1831 Chronic kidney disease, stage 3a: Secondary | ICD-10-CM | POA: Diagnosis present

## 2021-11-20 DIAGNOSIS — D649 Anemia, unspecified: Secondary | ICD-10-CM | POA: Diagnosis present

## 2021-11-20 DIAGNOSIS — K219 Gastro-esophageal reflux disease without esophagitis: Secondary | ICD-10-CM | POA: Diagnosis present

## 2021-11-20 DIAGNOSIS — E039 Hypothyroidism, unspecified: Secondary | ICD-10-CM | POA: Diagnosis present

## 2021-11-20 HISTORY — PX: TOTAL MASTECTOMY: SHX6129

## 2021-11-20 HISTORY — PX: AXILLARY SENTINEL NODE BIOPSY: SHX5738

## 2021-11-20 LAB — GLUCOSE, CAPILLARY: Glucose-Capillary: 160 mg/dL — ABNORMAL HIGH (ref 70–99)

## 2021-11-20 LAB — RESP PANEL BY RT-PCR (FLU A&B, COVID) ARPGX2
Influenza A by PCR: NEGATIVE
Influenza B by PCR: NEGATIVE
SARS Coronavirus 2 by RT PCR: NEGATIVE

## 2021-11-20 SURGERY — MASTECTOMY, SIMPLE
Anesthesia: General | Site: Breast | Laterality: Left

## 2021-11-20 MED ORDER — IRBESARTAN 150 MG PO TABS: 300.0000 mg | ORAL_TABLET | Freq: Every day | ORAL | Status: DC

## 2021-11-20 MED ORDER — BUPIVACAINE-EPINEPHRINE (PF) 0.5% -1:200000 IJ SOLN
INTRAMUSCULAR | Status: DC | PRN
  Administered 2021-11-20: 30 mL

## 2021-11-20 MED ORDER — LACTATED RINGERS IV SOLN: INTRAVENOUS | Status: DC

## 2021-11-20 MED ORDER — TECHNETIUM TC 99M TILMANOCEPT KIT
1.1200 | PACK | Freq: Once | INTRAVENOUS | Status: AC | PRN
  Administered 2021-11-20: 1.12 via INTRADERMAL

## 2021-11-20 MED ORDER — FENTANYL CITRATE (PF) 100 MCG/2ML IJ SOLN
25.0000 ug | INTRAMUSCULAR | Status: DC | PRN
  Administered 2021-11-20: 25 ug via INTRAVENOUS

## 2021-11-20 MED ORDER — PROPOFOL 10 MG/ML IV BOLUS
INTRAVENOUS | Status: DC | PRN
  Administered 2021-11-20: 140 mg via INTRAVENOUS

## 2021-11-20 MED ORDER — LEVOTHYROXINE SODIUM 50 MCG PO TABS
75.0000 ug | ORAL_TABLET | Freq: Every day | ORAL | Status: DC
  Administered 2021-11-21 – 2021-11-25 (×5): 75 ug via ORAL
  Filled 2021-11-20 (×4): qty 2

## 2021-11-20 MED ORDER — HYDRALAZINE HCL 20 MG/ML IJ SOLN
INTRAMUSCULAR | Status: AC
  Filled 2021-11-20: qty 1

## 2021-11-20 MED ORDER — CEFAZOLIN SODIUM-DEXTROSE 2-4 GM/100ML-% IV SOLN
INTRAVENOUS | Status: AC
  Filled 2021-11-20: qty 100

## 2021-11-20 MED ORDER — PANTOPRAZOLE SODIUM 40 MG PO TBEC
40.0000 mg | DELAYED_RELEASE_TABLET | Freq: Every day | ORAL | Status: DC
  Administered 2021-11-21 – 2021-11-25 (×5): 40 mg via ORAL
  Filled 2021-11-20 (×5): qty 1

## 2021-11-20 MED ORDER — VITAMIN B-12 100 MCG PO TABS
50.0000 ug | ORAL_TABLET | Freq: Two times a day (BID) | ORAL | Status: DC
  Administered 2021-11-20 – 2021-11-25 (×9): 50 ug via ORAL
  Filled 2021-11-20 (×11): qty 1

## 2021-11-20 MED ORDER — BUPIVACAINE-EPINEPHRINE (PF) 0.5% -1:200000 IJ SOLN
INTRAMUSCULAR | Status: AC
  Filled 2021-11-20: qty 30

## 2021-11-20 MED ORDER — ORAL CARE MOUTH RINSE: 15.0000 mL | Freq: Once | OROMUCOSAL | Status: AC

## 2021-11-20 MED ORDER — THERA-M PO TABS: 1.0000 | ORAL_TABLET | Freq: Every day | ORAL | Status: DC

## 2021-11-20 MED ORDER — POTASSIUM CHLORIDE CRYS ER 20 MEQ PO TBCR
20.0000 meq | EXTENDED_RELEASE_TABLET | Freq: Two times a day (BID) | ORAL | Status: DC
  Administered 2021-11-20 – 2021-11-21 (×3): 20 meq via ORAL
  Filled 2021-11-20 (×3): qty 1

## 2021-11-20 MED ORDER — CHLORHEXIDINE GLUCONATE CLOTH 2 % EX PADS: 6.0000 | MEDICATED_PAD | Freq: Once | CUTANEOUS | Status: DC

## 2021-11-20 MED ORDER — ACETAMINOPHEN 10 MG/ML IV SOLN
INTRAVENOUS | Status: DC | PRN
  Administered 2021-11-20: 1000 mg via INTRAVENOUS

## 2021-11-20 MED ORDER — CHLORHEXIDINE GLUCONATE 0.12 % MT SOLN: 15.0000 mL | Freq: Once | OROMUCOSAL | Status: AC

## 2021-11-20 MED ORDER — CELECOXIB 200 MG PO CAPS: 200.0000 mg | ORAL_CAPSULE | ORAL | Status: AC

## 2021-11-20 MED ORDER — ONDANSETRON HCL 4 MG/2ML IJ SOLN: 4.0000 mg | Freq: Once | INTRAMUSCULAR | Status: DC | PRN

## 2021-11-20 MED ORDER — CHLORHEXIDINE GLUCONATE 0.12 % MT SOLN
OROMUCOSAL | Status: AC
  Administered 2021-11-20: 15 mL via OROMUCOSAL
  Filled 2021-11-20: qty 15

## 2021-11-20 MED ORDER — CEFAZOLIN SODIUM-DEXTROSE 2-4 GM/100ML-% IV SOLN
2.0000 g | INTRAVENOUS | Status: AC
  Administered 2021-11-20: 2 g via INTRAVENOUS

## 2021-11-20 MED ORDER — SIMETHICONE 80 MG PO CHEW
160.0000 mg | CHEWABLE_TABLET | Freq: Every day | ORAL | Status: DC | PRN
  Administered 2021-11-21 – 2021-11-22 (×2): 160 mg via ORAL
  Filled 2021-11-20 (×2): qty 2

## 2021-11-20 MED ORDER — ACETAMINOPHEN 10 MG/ML IV SOLN
INTRAVENOUS | Status: AC
  Filled 2021-11-20: qty 100

## 2021-11-20 MED ORDER — PROPOFOL 10 MG/ML IV BOLUS
INTRAVENOUS | Status: AC
  Filled 2021-11-20: qty 20

## 2021-11-20 MED ORDER — ATORVASTATIN CALCIUM 20 MG PO TABS
80.0000 mg | ORAL_TABLET | Freq: Every day | ORAL | Status: DC
  Administered 2021-11-21 – 2021-11-25 (×5): 80 mg via ORAL
  Filled 2021-11-20 (×5): qty 4

## 2021-11-20 MED ORDER — CELECOXIB 200 MG PO CAPS
200.0000 mg | ORAL_CAPSULE | Freq: Two times a day (BID) | ORAL | Status: DC
  Administered 2021-11-20: 200 mg via ORAL
  Filled 2021-11-20 (×3): qty 1

## 2021-11-20 MED ORDER — FLUTICASONE PROPIONATE 50 MCG/ACT NA SUSP
1.0000 | Freq: Every day | NASAL | Status: DC | PRN
  Filled 2021-11-20: qty 16

## 2021-11-20 MED ORDER — TRAMADOL HCL 50 MG PO TABS
50.0000 mg | ORAL_TABLET | Freq: Four times a day (QID) | ORAL | Status: DC | PRN
  Administered 2021-11-20 – 2021-11-25 (×6): 50 mg via ORAL
  Filled 2021-11-20 (×8): qty 1

## 2021-11-20 MED ORDER — LIDOCAINE HCL (CARDIAC) PF 100 MG/5ML IV SOSY
PREFILLED_SYRINGE | INTRAVENOUS | Status: DC | PRN
  Administered 2021-11-20: 80 mg via INTRAVENOUS

## 2021-11-20 MED ORDER — SODIUM CHLORIDE (PF) 0.9 % IJ SOLN
INTRAMUSCULAR | Status: AC
  Filled 2021-11-20: qty 50

## 2021-11-20 MED ORDER — GABAPENTIN 300 MG PO CAPS
ORAL_CAPSULE | ORAL | Status: AC
  Administered 2021-11-20: 300 mg via ORAL
  Filled 2021-11-20: qty 1

## 2021-11-20 MED ORDER — FENTANYL CITRATE (PF) 100 MCG/2ML IJ SOLN
INTRAMUSCULAR | Status: AC
  Filled 2021-11-20: qty 2

## 2021-11-20 MED ORDER — ENOXAPARIN SODIUM 40 MG/0.4ML IJ SOSY: 40.0000 mg | PREFILLED_SYRINGE | INTRAMUSCULAR | Status: DC

## 2021-11-20 MED ORDER — IBUPROFEN 600 MG PO TABS
600.0000 mg | ORAL_TABLET | Freq: Four times a day (QID) | ORAL | Status: DC | PRN
  Filled 2021-11-20: qty 1

## 2021-11-20 MED ORDER — MECLIZINE HCL 25 MG PO TABS
12.5000 mg | ORAL_TABLET | Freq: Three times a day (TID) | ORAL | Status: DC | PRN
  Administered 2021-11-24: 11:00:00 12.5 mg via ORAL
  Filled 2021-11-20 (×4): qty 0.5

## 2021-11-20 MED ORDER — HYDRALAZINE HCL 25 MG PO TABS
25.0000 mg | ORAL_TABLET | Freq: Three times a day (TID) | ORAL | Status: DC
  Administered 2021-11-20: 25 mg via ORAL
  Filled 2021-11-20: qty 1

## 2021-11-20 MED ORDER — BUPIVACAINE HCL (PF) 0.5 % IJ SOLN
INTRAMUSCULAR | Status: AC
  Filled 2021-11-20: qty 30

## 2021-11-20 MED ORDER — ACETAMINOPHEN 500 MG PO TABS
500.0000 mg | ORAL_TABLET | Freq: Four times a day (QID) | ORAL | Status: DC | PRN
Start: 2021-11-20 — End: 2021-11-25
  Administered 2021-11-20 – 2021-11-24 (×4): 1000 mg via ORAL
  Filled 2021-11-20 (×4): qty 2

## 2021-11-20 MED ORDER — GABAPENTIN 300 MG PO CAPS: 300.0000 mg | ORAL_CAPSULE | ORAL | Status: AC

## 2021-11-20 MED ORDER — ALUM HYDROXIDE-MAG TRISILICATE 80-14.2 MG PO CHEW: 1.0000 | CHEWABLE_TABLET | Freq: Every day | ORAL | Status: DC | PRN

## 2021-11-20 MED ORDER — ONDANSETRON 4 MG PO TBDP: 4.0000 mg | ORAL_TABLET | Freq: Three times a day (TID) | ORAL | Status: DC | PRN

## 2021-11-20 MED ORDER — DEXAMETHASONE SODIUM PHOSPHATE 10 MG/ML IJ SOLN
INTRAMUSCULAR | Status: AC
  Filled 2021-11-20: qty 1

## 2021-11-20 MED ORDER — ACETAMINOPHEN 500 MG PO TABS: 1000.0000 mg | ORAL_TABLET | ORAL | Status: DC

## 2021-11-20 MED ORDER — CELECOXIB 200 MG PO CAPS
ORAL_CAPSULE | ORAL | Status: AC
  Administered 2021-11-20: 200 mg via ORAL
  Filled 2021-11-20: qty 1

## 2021-11-20 MED ORDER — BUPIVACAINE LIPOSOME 1.3 % IJ SUSP
INTRAMUSCULAR | Status: AC
  Filled 2021-11-20: qty 20

## 2021-11-20 MED ORDER — EPHEDRINE SULFATE 50 MG/ML IJ SOLN
INTRAMUSCULAR | Status: DC | PRN
  Administered 2021-11-20 (×2): 5 mg via INTRAVENOUS

## 2021-11-20 MED ORDER — DEXAMETHASONE SODIUM PHOSPHATE 10 MG/ML IJ SOLN
INTRAMUSCULAR | Status: DC | PRN
  Administered 2021-11-20: 8 mg via INTRAVENOUS

## 2021-11-20 MED ORDER — EPHEDRINE 5 MG/ML INJ
INTRAVENOUS | Status: AC
  Filled 2021-11-20: qty 5

## 2021-11-20 MED ORDER — ONDANSETRON 4 MG PO TBDP: 4.0000 mg | ORAL_TABLET | Freq: Four times a day (QID) | ORAL | Status: DC | PRN

## 2021-11-20 MED ORDER — ADULT MULTIVITAMIN W/MINERALS CH
1.0000 | ORAL_TABLET | Freq: Every day | ORAL | Status: DC
Start: 2021-11-21 — End: 2021-11-25
  Administered 2021-11-21 – 2021-11-25 (×5): 1 via ORAL
  Filled 2021-11-20 (×5): qty 1

## 2021-11-20 MED ORDER — ONDANSETRON HCL 4 MG/2ML IJ SOLN
INTRAMUSCULAR | Status: AC
  Filled 2021-11-20: qty 2

## 2021-11-20 MED ORDER — MORPHINE SULFATE (PF) 2 MG/ML IV SOLN
1.0000 mg | INTRAVENOUS | Status: DC | PRN
  Administered 2021-11-21 – 2021-11-25 (×4): 1 mg via INTRAVENOUS
  Filled 2021-11-20 (×4): qty 1

## 2021-11-20 MED ORDER — TROLAMINE SALICYLATE 10 % EX CREA
1.0000 "application " | TOPICAL_CREAM | CUTANEOUS | Status: DC | PRN
  Filled 2021-11-20: qty 85

## 2021-11-20 MED ORDER — SODIUM CHLORIDE (PF) 0.9 % IJ SOLN
INTRAMUSCULAR | Status: DC | PRN
  Administered 2021-11-20: 80 mL

## 2021-11-20 MED ORDER — GABAPENTIN 300 MG PO CAPS
300.0000 mg | ORAL_CAPSULE | Freq: Two times a day (BID) | ORAL | Status: DC
  Administered 2021-11-20 – 2021-11-25 (×10): 300 mg via ORAL
  Filled 2021-11-20 (×10): qty 1

## 2021-11-20 MED ORDER — STERILE WATER FOR IRRIGATION IR SOLN
Status: DC | PRN
  Administered 2021-11-20: 1000 mL

## 2021-11-20 MED ORDER — METOPROLOL SUCCINATE ER 50 MG PO TB24
50.0000 mg | ORAL_TABLET | Freq: Two times a day (BID) | ORAL | Status: DC
  Administered 2021-11-20: 50 mg via ORAL
  Filled 2021-11-20: qty 1

## 2021-11-20 MED ORDER — HYDRALAZINE HCL 20 MG/ML IJ SOLN
10.0000 mg | Freq: Once | INTRAMUSCULAR | Status: AC
  Administered 2021-11-20: 10 mg via INTRAVENOUS

## 2021-11-20 MED ORDER — ONDANSETRON HCL 4 MG/2ML IJ SOLN
4.0000 mg | Freq: Four times a day (QID) | INTRAMUSCULAR | Status: DC | PRN
  Administered 2021-11-22 – 2021-11-23 (×2): 4 mg via INTRAVENOUS
  Filled 2021-11-20 (×2): qty 2

## 2021-11-20 MED ORDER — MAGNESIUM OXIDE 400 MG PO TABS
400.0000 mg | ORAL_TABLET | Freq: Every day | ORAL | Status: DC
  Administered 2021-11-21 – 2021-11-25 (×5): 400 mg via ORAL
  Filled 2021-11-20 (×9): qty 1

## 2021-11-20 MED ORDER — LIDOCAINE HCL (PF) 2 % IJ SOLN
INTRAMUSCULAR | Status: AC
  Filled 2021-11-20: qty 5

## 2021-11-20 MED ORDER — FENTANYL CITRATE (PF) 100 MCG/2ML IJ SOLN
INTRAMUSCULAR | Status: DC | PRN
  Administered 2021-11-20 (×4): 25 ug via INTRAVENOUS

## 2021-11-20 MED ORDER — ACETAMINOPHEN 500 MG PO TABS
ORAL_TABLET | ORAL | Status: AC
  Filled 2021-11-20: qty 2

## 2021-11-20 MED ORDER — ALUM & MAG HYDROXIDE-SIMETH 200-200-20 MG/5ML PO SUSP: 30.0000 mL | Freq: Four times a day (QID) | ORAL | Status: DC | PRN

## 2021-11-20 SURGICAL SUPPLY — 64 items
ADH SKN CLS APL DERMABOND .7 (GAUZE/BANDAGES/DRESSINGS) ×1
APL PRP STRL LF DISP 70% ISPRP (MISCELLANEOUS) ×1
APPLIER CLIP 11 MED OPEN (CLIP)
APR CLP MED 11 20 MLT OPN (CLIP)
BLADE SURG 15 STRL LF DISP TIS (BLADE) ×2 IMPLANT
BLADE SURG 15 STRL SS (BLADE) ×3
BULB RESERV EVAC DRAIN JP 100C (MISCELLANEOUS) IMPLANT
CANISTER WOUND CARE 500ML ATS (WOUND CARE) ×2 IMPLANT
CHLORAPREP W/TINT 26 (MISCELLANEOUS) ×4 IMPLANT
CLIP APPLIE 11 MED OPEN (CLIP) IMPLANT
CNTNR SPEC 2.5X3XGRAD LEK (MISCELLANEOUS)
CONT SPEC 4OZ STER OR WHT (MISCELLANEOUS)
CONT SPEC 4OZ STRL OR WHT (MISCELLANEOUS)
CONTAINER SPEC 2.5X3XGRAD LEK (MISCELLANEOUS) IMPLANT
DERMABOND ADVANCED (GAUZE/BANDAGES/DRESSINGS) ×2
DERMABOND ADVANCED .7 DNX12 (GAUZE/BANDAGES/DRESSINGS) ×2 IMPLANT
DEVICE DSSCT PLSMBLD 3.0S LGHT (MISCELLANEOUS) ×2 IMPLANT
DRAIN CHANNEL JP 15F RND 16 (MISCELLANEOUS) ×2 IMPLANT
DRAPE LAPAROTOMY TRNSV 106X77 (MISCELLANEOUS) ×4 IMPLANT
DRSG GAUZE FLUFF 36X18 (GAUZE/BANDAGES/DRESSINGS) ×4 IMPLANT
ELECT REM PT RETURN 9FT ADLT (ELECTROSURGICAL) ×3
ELECTRODE REM PT RTRN 9FT ADLT (ELECTROSURGICAL) ×2 IMPLANT
EVACUATOR SILICONE 100CC (DRAIN) ×2 IMPLANT
GAUZE 4X4 16PLY ~~LOC~~+RFID DBL (SPONGE) ×4 IMPLANT
GAUZE SPONGE 4X4 12PLY STRL (GAUZE/BANDAGES/DRESSINGS) IMPLANT
GLOVE SURG SYN 6.5 ES PF (GLOVE) ×3 IMPLANT
GLOVE SURG SYN 6.5 PF PI (GLOVE) ×2 IMPLANT
GLOVE SURG UNDER POLY LF SZ7 (GLOVE) ×4 IMPLANT
GOWN STRL REUS W/ TWL LRG LVL3 (GOWN DISPOSABLE) ×4 IMPLANT
GOWN STRL REUS W/TWL LRG LVL3 (GOWN DISPOSABLE) ×6
KIT MARKER MARGIN INK (KITS) IMPLANT
KIT PREVENA INCISION MGT20CM45 (CANNISTER) ×2 IMPLANT
KIT TURNOVER KIT A (KITS) ×4 IMPLANT
LABEL OR SOLS (LABEL) ×4 IMPLANT
LIGHT WAVEGUIDE WIDE FLAT (MISCELLANEOUS) IMPLANT
MANIFOLD NEPTUNE II (INSTRUMENTS) ×4 IMPLANT
NEEDLE HYPO 22GX1.5 SAFETY (NEEDLE) ×8 IMPLANT
PACK BASIN MINOR ARMC (MISCELLANEOUS) ×4 IMPLANT
PLASMABLADE 3.0S W/LIGHT (MISCELLANEOUS) ×3
SLEVE PROBE SENORX GAMMA FIND (MISCELLANEOUS) ×4 IMPLANT
SPONGE DRAIN TRACH 4X4 STRL 2S (GAUZE/BANDAGES/DRESSINGS) ×2 IMPLANT
SPONGE T-LAP 18X18 ~~LOC~~+RFID (SPONGE) ×4 IMPLANT
STAPLER SKIN PROX 35W (STAPLE) IMPLANT
SUT ETHILON 3-0 FS-10 30 BLK (SUTURE) ×3
SUT MNCRL 4-0 (SUTURE)
SUT MNCRL 4-0 27XMFL (SUTURE)
SUT SILK 2 0 (SUTURE)
SUT SILK 2 0 SH (SUTURE) ×4 IMPLANT
SUT SILK 2-0 30XBRD TIE 12 (SUTURE) ×2 IMPLANT
SUT SILK 3 0 12 30 (SUTURE) ×2 IMPLANT
SUT SILK 3-0 (SUTURE) ×2 IMPLANT
SUT VIC AB 2-0 SH 27 (SUTURE) ×3
SUT VIC AB 2-0 SH 27XBRD (SUTURE) ×2 IMPLANT
SUT VIC AB 3-0 SH 27 (SUTURE) ×9
SUT VIC AB 3-0 SH 27X BRD (SUTURE) ×8 IMPLANT
SUTURE EHLN 3-0 FS-10 30 BLK (SUTURE) IMPLANT
SUTURE MNCRL 4-0 27XMF (SUTURE) ×2 IMPLANT
SYR 10ML LL (SYRINGE) ×4 IMPLANT
SYR 20ML LL LF (SYRINGE) ×8 IMPLANT
SYR BULB IRRIG 60ML STRL (SYRINGE) ×4 IMPLANT
TUBING CONNECTING 10 (TUBING) ×3 IMPLANT
TUBING CONNECTING 10' (TUBING) ×1
WATER STERILE IRR 1000ML POUR (IV SOLUTION) ×6 IMPLANT
WATER STERILE IRR 500ML POUR (IV SOLUTION) ×2 IMPLANT

## 2021-11-20 NOTE — Progress Notes (Addendum)
Met with patient prior to surgery. 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 Continued to check in with family in the surgical waiting area, will follow up once patient is assigned to a room.

## 2021-11-20 NOTE — Anesthesia Procedure Notes (Signed)
Procedure Name: LMA Insertion Date/Time: 11/20/2021 12:15 PM Performed by: Demetrius Charity, CRNA Pre-anesthesia Checklist: Patient identified, Patient being monitored, Timeout performed, Emergency Drugs available and Suction available Patient Re-evaluated:Patient Re-evaluated prior to induction Oxygen Delivery Method: Circle system utilized Preoxygenation: Pre-oxygenation with 100% oxygen Induction Type: IV induction Ventilation: Mask ventilation without difficulty LMA: LMA inserted LMA Size: 3.5 Tube type: Oral Number of attempts: 1 Placement Confirmation: positive ETCO2 and breath sounds checked- equal and bilateral Tube secured with: Tape Dental Injury: Teeth and Oropharynx as per pre-operative assessment

## 2021-11-20 NOTE — Anesthesia Preprocedure Evaluation (Signed)
Anesthesia Evaluation  Patient identified by MRN, date of birth, ID band Patient awake and Patient confused    Reviewed: Allergy & Precautions, NPO status , Patient's Chart, lab work & pertinent test results  History of Anesthesia Complications Negative for: history of anesthetic complications  Airway Mallampati: II  TM Distance: >3 FB Neck ROM: full    Dental  (+) Upper Dentures, Lower Dentures, Dental Advidsory Given   Pulmonary neg pulmonary ROS,    Pulmonary exam normal  + decreased breath sounds      Cardiovascular Exercise Tolerance: Poor hypertension, Pt. on medications (-) anginaNormal cardiovascular exam(-) dysrhythmias (-) Valvular Problems/Murmurs Rhythm:Regular Rate:Normal     Neuro/Psych PSYCHIATRIC DISORDERS Depression negative neurological ROS     GI/Hepatic negative GI ROS, Neg liver ROS, GERD  ,  Endo/Other  neg diabetesHypothyroidism   Renal/GU Renal diseasenegative Renal ROS  negative genitourinary   Musculoskeletal negative musculoskeletal ROS (+)   Abdominal (+) + obese,   Peds negative pediatric ROS (+)  Hematology negative hematology ROS (+)   Anesthesia Other Findings Past Medical History: No date: Breast cancer ([place]) No date: Depression No date: HLD (hyperlipidemia) No date: Hypertension No date: Hypothyroidism  Past Surgical History: 09/23/2021: BREAST BIOPSY; Left     Comment:  3:00 13cmfn venus marker, path pending 09/23/2021: BREAST BIOPSY; Left     Comment:  3:00 14 cmfn heart marker, path pending No date: CHOLECYSTECTOMY No date: left lumpectomy  BMI    Body Mass Index: 27.76 kg/m      Reproductive/Obstetrics negative OB ROS                             Anesthesia Physical  Anesthesia Plan  ASA: 3  Anesthesia Plan: General   Post-op Pain Management:    Induction: Intravenous  PONV Risk Score and Plan: 3 and Ondansetron, Dexamethasone  and Treatment may vary due to age or medical condition  Airway Management Planned: LMA  Additional Equipment:   Intra-op Plan:   Post-operative Plan: Extubation in OR  Informed Consent: I have reviewed the patients History and Physical, chart, labs and discussed the procedure including the risks, benefits and alternatives for the proposed anesthesia with the patient or authorized representative who has indicated his/her understanding and acceptance.     Dental Advisory Given  Plan Discussed with: CRNA and Surgeon  Anesthesia Plan Comments:         Anesthesia Quick Evaluation

## 2021-11-20 NOTE — Interval H&P Note (Signed)
History and Physical Interval Note:  11/20/2021 11:44 AM  Cindy Huerta  has presented today for surgery, with the diagnosis of C50.912 Recurrent breast cancer.  The various methods of treatment have been discussed with the patient and family. After consideration of risks, benefits and other options for treatment, the patient has consented to  Procedure(s): TOTAL MASTECTOMY (Left) AXILLARY SENTINEL NODE BIOPSY (Left) as a surgical intervention.  The patient's history has been reviewed, patient examined, no change in status, stable for surgery.  I have reviewed the patient's chart and labs.  Questions were answered to the patient's satisfaction.     [name] Lysle Pearl

## 2021-11-20 NOTE — Significant Event (Signed)
Rapid Response Event Note   Reason for Call : patient passed out on [facility] after coming over from PACU   Initial Focused Assessment: Laying in bed, arousable, VSS, no c/o pain. No drainage in wound vac, 20 ml in JP, and voided 422ml before passing out.      Interventions: Dr Lysle Pearl notified   Plan of Care: No new orders, Jun, RN to call if further assistance needed.    Event Summary: as above  MD Notified: Sakai [address] [place] A, RN

## 2021-11-20 NOTE — Transfer of Care (Signed)
Immediate Anesthesia Transfer of Care Note  Patient: Cindy Huerta  Procedure(s) Performed: TOTAL MASTECTOMY (Left: Breast) AXILLARY SENTINEL NODE BIOPSY (Left: Breast)  Patient Location: PACU  Anesthesia Type:General  Level of Consciousness: awake, alert  and oriented  Airway & Oxygen Therapy: Patient Spontanous Breathing and Patient connected to nasal cannula oxygen  Post-op Assessment: Report given to RN and Post -op Vital signs reviewed and stable  Post vital signs: Reviewed and stable  Last Vitals:  Vitals Value Taken Time  BP 188/82 11/20/21 1441  Temp    Pulse 62 11/20/21 1442  Resp 15 11/20/21 1442  SpO2 100 % 11/20/21 1442  Vitals shown include unvalidated device data.  Last Pain:  Vitals:   11/20/21 1124  TempSrc: Oral  PainSc: 0-No pain         Complications: No notable events documented.

## 2021-11-20 NOTE — Op Note (Signed)
Preoperative diagnosis: left recurrent Breast Cancer.  Postoperative diagnosis: same.   Procedure: left simple mastectomy. SLNB  Anesthesia: GETA  Surgeon: Dr. Lysle Pearl  Wound Classification: Clean  Specimen: left Breast, sentinel lymph node x2  Complications: None  Estimated Blood Loss: 25 mL   Indications: Patient is a 75 y.o. female who had an abnormal mammogram on workup with core needle biopsy was found to be recurrent breast CA. After discussion of alternatives, the patient elected (simple) mastectomy, SLNB  Findings: Palpable lump of known CA within left breast specimen. Sentinel lymph node x2, palpable  peration performed with curative intent:Yes  Tracer(s) used to identify sentinel nodes in the upfront surgery (non-neoadjuvant) setting (select all that apply):Radioactive Tracer  Tracer(s) used to identify sentinel nodes in the neoadjuvant setting (select all that apply):N/A  All nodes (colored or non-colored) present at the end of a dye-filled lymphatic channel were removed:N/A  All significantly radioactive nodes were removed:Yes  All palpable suspicious nodes were removed:Yes  Biopsy-proven positive nodes marked with clips prior to chemotherapy were identified and removed:N/A   Description of procedure: The patient was brought to the operating room and general anesthesia was induced. A time-out was completed verifying correct patient, procedure, site, positioning, and implant(s) and/or special equipment prior to beginning this procedure. The breast, chest wall, axilla, and upper arm and neck were prepped and draped in the usual sterile fashion.  A skin incision was made that encompassed the nipple-areola complex and passed in an oblique direction across the breast.  Hand-held gamma probe was used to identify the location of the hottest spot in the axilla, thorough the lateral edge of the original incision.  Sharp and blunt Dissection was carried down to subdermal  facias. The probe was placed within wound and again, the point of maximal count was found. Dissection continue until nodule was identified. The probe was placed in contact with the node and 1300 counts were recorded. The node was excised in its entirety. Ex vivo, the node measured 1388 counts when placed on the probe. The bed of the node measured 100 counts. One additional hot spot was detected and the node was excised in similar fashion. No additional hot spots were identified. No additional clinically abnormal nodes were palpated.  Sent off to pathology.  Skin flaps were raised in the avascular plane between subcutaneous tissue and breast tissue from the clavicle superiorly, the sternum medially, the anterior rectus sheath inferiorly, and past the lateral border of the pectoralis major muscle laterally. Hemostasis was achieved in the flaps. Next, the breast tissue and underlying pectoralis fascia were excised from the pectoralis major muscle, progressing from medially to laterally. At the lateral border of the pectoralis major muscle, the breast tissue was swung laterally and a lateral pedicle identified where breast tissue gave way to fat of axilla. The lateral pedicle was incised and the specimen removed.   Specimen marked Cumb[place] lateral, short superior and sent to pathology. The wound was irrigated and hemostasis was achieved. Closed suction drain was brought into the operative field through a separate stab incision and sutured to the skin with a 3-0 nylon suture. The wound was closed with interrupted 3-0 Vicryl to the subcutaneous layer, followed by a staples for skin.  Prevena wound vac and drain sponge applied to area. The patient tolerated the procedure well and was taken to the postanesthesia care unit in stable condition.

## 2021-11-20 NOTE — Progress Notes (Signed)
Admission was not done. Pt was asleep and family left.

## 2021-11-20 NOTE — Progress Notes (Signed)
Stayed with patient for a period of time helping her with her meal and to settle down after being a bit anxious. Will follow up.

## 2021-11-20 NOTE — Anesthesia Postprocedure Evaluation (Signed)
Anesthesia Post Note  Patient: Cindy Huerta  Procedure(s) Performed: TOTAL MASTECTOMY (Left: Breast) AXILLARY SENTINEL NODE BIOPSY (Left: Breast)  Patient location during evaluation: PACU Anesthesia Type: General Level of consciousness: awake and alert Pain management: pain level controlled Vital Signs Assessment: post-procedure vital signs reviewed and stable Respiratory status: spontaneous breathing, nonlabored ventilation, respiratory function stable and patient connected to nasal cannula oxygen Cardiovascular status: blood pressure returned to baseline and stable Postop Assessment: no apparent nausea or vomiting Anesthetic complications: no   No notable events documented.   Last Vitals:  Vitals:   11/20/21 1750 11/20/21 1800  BP: (!) 152/90 127/67  Pulse: 79 78  Resp: 18   Temp:    SpO2: 100%     Last Pain:  Vitals:   11/20/21 1700  TempSrc:   PainSc: 0-No pain                 Martha Clan

## 2021-11-20 NOTE — Progress Notes (Addendum)
Pt wanted to void. Pt was assisted to [facility] and pass out while voiding. Rapid was called. VS was taken and recorded. Minutes after pt was awaken and responding appropriately, do not have memory of what happened. MD was notified. Mostly valsalva issue. Current BP is 127/67, sleeping.   11/20/21 1750  Vitals  BP (!) 152/90  MAP (mmHg) 104  Pulse Rate 79  Resp 18  MEWS COLOR  MEWS Score Color Green  Oxygen Therapy  SpO2 100 %  O2 Device Nasal Cannula  O2 Flow Rate (L/min) 4 L/min  MEWS Score  MEWS Temp 0  MEWS Systolic 0  MEWS Pulse 0  MEWS RR 0  MEWS LOC 0  MEWS Score 0

## 2021-11-21 ENCOUNTER — Observation Stay: Payer: Medicare PPO | Admitting: Certified Registered"

## 2021-11-21 ENCOUNTER — Encounter
Admission: AD | Disposition: A | Discharge status: Home / Self Care | Payer: Self-pay | Source: Home / Self Care | Attending: Surgery

## 2021-11-21 ENCOUNTER — Encounter: Payer: Self-pay | Admitting: Surgery

## 2021-11-21 HISTORY — PX: HEMATOMA EVACUATION: SHX5118

## 2021-11-21 LAB — CBC
HCT: 29.1 % — ABNORMAL LOW (ref 36.0–46.0)
HCT: 23.6 % — ABNORMAL LOW (ref 36.0–46.0)
Hemoglobin: 9.9 g/dL — ABNORMAL LOW (ref 12.0–15.0)
Hemoglobin: 8 g/dL — ABNORMAL LOW (ref 12.0–15.0)
MCH: 29.6 pg (ref 26.0–34.0)
MCH: 29.7 pg (ref 26.0–34.0)
MCHC: 34 g/dL (ref 30.0–36.0)
MCHC: 33.9 g/dL (ref 30.0–36.0)
MCV: 87.1 fL (ref 80.0–100.0)
MCV: 87.7 fL (ref 80.0–100.0)
Platelets: 226 10*3/uL (ref 150–400)
Platelets: 182 10*3/uL (ref 150–400)
RBC: 3.34 MIL/uL — ABNORMAL LOW (ref 3.87–5.11)
RBC: 2.69 MIL/uL — ABNORMAL LOW (ref 3.87–5.11)
RDW: 14.8 % (ref 11.5–15.5)
RDW: 14.9 % (ref 11.5–15.5)
WBC: 15.9 10*3/uL — ABNORMAL HIGH (ref 4.0–10.5)
WBC: 15 10*3/uL — ABNORMAL HIGH (ref 4.0–10.5)
nRBC: 0 % (ref 0.0–0.2)
nRBC: 0 % (ref 0.0–0.2)

## 2021-11-21 LAB — BASIC METABOLIC PANEL
Anion gap: 8 (ref 5–15)
BUN: 22 mg/dL (ref 8–23)
CO2: 25 mmol/L (ref 22–32)
Calcium: 8.9 mg/dL (ref 8.9–10.3)
Chloride: 100 mmol/L (ref 98–111)
Creatinine, Ser: 1.66 mg/dL — ABNORMAL HIGH (ref 0.44–1.00)
GFR, Estimated: 32 mL/min — ABNORMAL LOW (ref >60)
Glucose, Bld: 134 mg/dL — ABNORMAL HIGH (ref 70–99)
Potassium: 4.4 mmol/L (ref 3.5–5.1)
Sodium: 133 mmol/L — ABNORMAL LOW (ref 135–145)

## 2021-11-21 LAB — HEMOGLOBIN AND HEMATOCRIT, BLOOD
HCT: 28.3 % — ABNORMAL LOW (ref 36.0–46.0)
Hemoglobin: 9.3 g/dL — ABNORMAL LOW (ref 12.0–15.0)

## 2021-11-21 LAB — PROTIME-INR
INR: 1.3 — ABNORMAL HIGH (ref 0.8–1.2)
Prothrombin Time: 16.6 seconds — ABNORMAL HIGH (ref 11.4–15.2)

## 2021-11-21 SURGERY — EVACUATION HEMATOMA
Anesthesia: General | Laterality: Left

## 2021-11-21 MED ORDER — 0.9 % SODIUM CHLORIDE (POUR BTL) OPTIME
TOPICAL | Status: DC | PRN
  Administered 2021-11-21: 500 mL

## 2021-11-21 MED ORDER — PROPOFOL 10 MG/ML IV BOLUS
INTRAVENOUS | Status: AC
  Filled 2021-11-21: qty 20

## 2021-11-21 MED ORDER — ONDANSETRON HCL 4 MG/2ML IJ SOLN: 4.0000 mg | Freq: Once | INTRAMUSCULAR | Status: DC | PRN

## 2021-11-21 MED ORDER — SODIUM CHLORIDE 0.9 % IV SOLN: INTRAVENOUS | Status: DC

## 2021-11-21 MED ORDER — DEXAMETHASONE SODIUM PHOSPHATE 10 MG/ML IJ SOLN
INTRAMUSCULAR | Status: DC | PRN
  Administered 2021-11-21: 5 mg via INTRAVENOUS

## 2021-11-21 MED ORDER — CEFAZOLIN SODIUM-DEXTROSE 2-4 GM/100ML-% IV SOLN
2.0000 g | Freq: Once | INTRAVENOUS | Status: DC
  Filled 2021-11-21: qty 100

## 2021-11-21 MED ORDER — FENTANYL CITRATE (PF) 100 MCG/2ML IJ SOLN: 25.0000 ug | INTRAMUSCULAR | Status: DC | PRN

## 2021-11-21 MED ORDER — FENTANYL CITRATE (PF) 100 MCG/2ML IJ SOLN
INTRAMUSCULAR | Status: AC
  Filled 2021-11-21: qty 2

## 2021-11-21 MED ORDER — ONDANSETRON HCL 4 MG/2ML IJ SOLN
INTRAMUSCULAR | Status: DC | PRN
  Administered 2021-11-21: 4 mg via INTRAVENOUS

## 2021-11-21 MED ORDER — MIDAZOLAM HCL 5 MG/5ML IJ SOLN
INTRAMUSCULAR | Status: DC | PRN
  Administered 2021-11-21: 1 mg via INTRAVENOUS

## 2021-11-21 MED ORDER — MIDAZOLAM HCL 2 MG/2ML IJ SOLN
INTRAMUSCULAR | Status: AC
  Filled 2021-11-21: qty 2

## 2021-11-21 MED ORDER — LIDOCAINE HCL (PF) 2 % IJ SOLN
INTRAMUSCULAR | Status: AC
  Filled 2021-11-21: qty 5

## 2021-11-21 MED ORDER — DROPERIDOL 2.5 MG/ML IJ SOLN
0.6250 mg | Freq: Once | INTRAMUSCULAR | Status: DC | PRN
  Filled 2021-11-21: qty 2

## 2021-11-21 MED ORDER — LIDOCAINE HCL (CARDIAC) PF 100 MG/5ML IV SOSY
PREFILLED_SYRINGE | INTRAVENOUS | Status: DC | PRN
  Administered 2021-11-21: 60 mg via INTRAVENOUS

## 2021-11-21 MED ORDER — MEPERIDINE HCL 25 MG/ML IJ SOLN: 6.2500 mg | INTRAMUSCULAR | Status: DC | PRN

## 2021-11-21 MED ORDER — HYDROCODONE-ACETAMINOPHEN 7.5-325 MG PO TABS
1.0000 | ORAL_TABLET | Freq: Once | ORAL | Status: DC | PRN
  Filled 2021-11-21: qty 1

## 2021-11-21 MED ORDER — PHENYLEPHRINE HCL (PRESSORS) 10 MG/ML IV SOLN
INTRAVENOUS | Status: DC | PRN
  Administered 2021-11-21: 40 ug via INTRAVENOUS
  Administered 2021-11-21 (×2): 80 ug via INTRAVENOUS
  Administered 2021-11-21: 40 ug via INTRAVENOUS

## 2021-11-21 MED ORDER — CEFAZOLIN SODIUM-DEXTROSE 2-3 GM-%(50ML) IV SOLR
INTRAVENOUS | Status: DC | PRN
  Administered 2021-11-21: 2 g via INTRAVENOUS

## 2021-11-21 MED ORDER — PROPOFOL 10 MG/ML IV BOLUS
INTRAVENOUS | Status: DC | PRN
  Administered 2021-11-21: 100 mg via INTRAVENOUS

## 2021-11-21 MED ORDER — HEMOSTATIC AGENTS (NO CHARGE) OPTIME
TOPICAL | Status: DC | PRN
  Administered 2021-11-21 (×2): 1 via TOPICAL

## 2021-11-21 MED ORDER — LACTATED RINGERS IV SOLN: INTRAVENOUS | Status: DC

## 2021-11-21 SURGICAL SUPPLY — 35 items
APL PRP STRL LF DISP 70% ISPRP (MISCELLANEOUS) ×1
BLADE SURG 15 STRL LF DISP TIS (BLADE) ×2 IMPLANT
BLADE SURG 15 STRL SS (BLADE) ×3
CHLORAPREP W/TINT 26 (MISCELLANEOUS) ×4 IMPLANT
DRAPE 3/4 80X56 (DRAPES) ×4 IMPLANT
DRAPE LAPAROTOMY 100X77 ABD (DRAPES) ×4 IMPLANT
DRESSING PEEL AND PLAC PRVNA20 (GAUZE/BANDAGES/DRESSINGS) IMPLANT
DRSG PEEL AND PLACE PREVENA 20 (GAUZE/BANDAGES/DRESSINGS) ×3
ELECT CAUTERY BLADE 6.4 (BLADE) ×4 IMPLANT
ELECT REM PT RETURN 9FT ADLT (ELECTROSURGICAL) ×3
ELECTRODE REM PT RTRN 9FT ADLT (ELECTROSURGICAL) ×2 IMPLANT
GAUZE 4X4 16PLY ~~LOC~~+RFID DBL (SPONGE) ×4 IMPLANT
GLOVE SURG SYN 6.5 ES PF (GLOVE) ×3 IMPLANT
GLOVE SURG SYN 6.5 PF PI (GLOVE) ×2 IMPLANT
GLOVE SURG UNDER POLY LF SZ7 (GLOVE) ×4 IMPLANT
GOWN STRL REUS W/ TWL LRG LVL3 (GOWN DISPOSABLE) ×4 IMPLANT
GOWN STRL REUS W/TWL LRG LVL3 (GOWN DISPOSABLE) ×6
HEMOSTAT ARISTA ABSORB 3G PWDR (HEMOSTASIS) ×2 IMPLANT
KIT TURNOVER KIT A (KITS) ×4 IMPLANT
LABEL OR SOLS (LABEL) ×4 IMPLANT
MANIFOLD NEPTUNE II (INSTRUMENTS) ×4 IMPLANT
NEEDLE HYPO 22GX1.5 SAFETY (NEEDLE) ×4 IMPLANT
NS IRRIG 1000ML POUR BTL (IV SOLUTION) ×4 IMPLANT
PACK BASIN MINOR ARMC (MISCELLANEOUS) ×4 IMPLANT
SPONGE DRAIN TRACH 4X4 STRL 2S (GAUZE/BANDAGES/DRESSINGS) ×2 IMPLANT
SPONGE T-LAP 18X18 ~~LOC~~+RFID (SPONGE) ×4 IMPLANT
SUT MNCRL 4-0 (SUTURE) ×3
SUT MNCRL 4-0 27XMFL (SUTURE) ×1
SUT SILK 3-0 (SUTURE) ×2 IMPLANT
SUT VIC AB 3-0 SH 27 (SUTURE) ×6
SUT VIC AB 3-0 SH 27X BRD (SUTURE) ×2 IMPLANT
SUTURE MNCRL 4-0 27XMF (SUTURE) ×2 IMPLANT
SYR 30ML LL (SYRINGE) ×4 IMPLANT
TOWEL OR 17X26 4PK STRL BLUE (TOWEL DISPOSABLE) ×4 IMPLANT
WATER STERILE IRR 500ML POUR (IV SOLUTION) ×4 IMPLANT

## 2021-11-21 NOTE — Anesthesia Postprocedure Evaluation (Signed)
Anesthesia Post Note  Patient: Cindy Huerta  Procedure(s) Performed: EVACUATION HEMATOMA (Left)  Patient location during evaluation: PACU Anesthesia Type: General Level of consciousness: awake and alert Pain management: pain level controlled Vital Signs Assessment: post-procedure vital signs reviewed and stable Respiratory status: spontaneous breathing, nonlabored ventilation, respiratory function stable and patient connected to nasal cannula oxygen Cardiovascular status: blood pressure returned to baseline and stable Postop Assessment: no apparent nausea or vomiting Anesthetic complications: no   No notable events documented.   Last Vitals:  Vitals:   11/21/21 1202 11/21/21 1608  BP: 133/66 131/69  Pulse: 81 79  Resp: 19 14  Temp: 36.8 C 36.6 C  SpO2: 98% 98%    Last Pain:  Vitals:   11/21/21 1255  TempSrc:   PainSc: 5                  Tonny Bollman

## 2021-11-21 NOTE — Care Management Obs Status (Signed)
[facility] NOTIFICATION   Patient Details  Name: Cindy Huerta MRN: [id number] Date of Birth: [birth year]   Medicare Observation Status Notification Given:  Yes    Candie Chroman, LCSW 11/21/2021, 3:37 PM

## 2021-11-21 NOTE — Progress Notes (Addendum)
Subjective:  CC: Cindy Huerta is a 75 y.o. female  Hospital stay day 0, Day of Surgery left mastectomy  HPI: JP still noted to have some blood.  Less brisk than this am. Pt still Denies any pain.  ROS:  General: Denies weight loss, weight gain, fatigue, fevers, chills, and night sweats. Heart: Denies chest pain, palpitations, racing heart, irregular heartbeat, leg pain or swelling, and decreased activity tolerance. Respiratory: Denies breathing difficulty, shortness of breath, wheezing, cough, and sputum. GI: Denies change in appetite, heartburn, nausea, vomiting, constipation, diarrhea, and blood in stool. GU: Denies difficulty urinating, pain with urinating, urgency, frequency, blood in urine.   Objective:   Temp:  [97 F (36.1 C)-98.2 F (36.8 C)] 98.2 F (36.8 C) (12/17 1923) Pulse Rate:  [69-82] 82 (12/17 1923) Resp:  [12-20] 20 (12/17 1923) BP: (99-143)/(63-90) 129/65 (12/17 1923) SpO2:  [97 %-100 %] 98 % (12/17 1923)     Height: 5\' 6"  (167.6 cm) Weight: 77.1 kg BMI (Calculated): 27.45   Intake/Output this shift:   Intake/Output Summary (Last 24 hours) at 11/21/2021 2250 Last data filed at 11/21/2021 1930 Gross per 24 hour  Intake 955 ml  Output 310 ml  Net 645 ml    Constitutional :  alert, cooperative, appears stated age, and no distress  Respiratory:  clear to auscultation bilaterally  Cardiovascular:  regular rate and rhythm  Gastrointestinal: soft, non-tender; bowel sounds normal; no masses,  no organomegaly.   Skin: Cool and moist. Mastectomy site soft, with some bruising noted, no major swelling.  JP with dark venous blood.  Psychiatric: Normal affect, non-agitated, not confused       LABS:  CMP Latest Ref Rng & Units 11/21/2021 10/10/2021 10/09/2021  Glucose 70 - 99 mg/dL 134(H) 105(H) 135(H)  BUN 8 - 23 mg/dL 22 <5(L) 6(L)  Creatinine 0.44 - 1.00 mg/dL 1.66(H) 0.92 0.98  Sodium 135 - 145 mmol/L 133(L) 136 137  Potassium 3.5 - 5.1 mmol/L 4.4 3.6 3.0(L)   Chloride 98 - 111 mmol/L 100 104 103  CO2 22 - 32 mmol/L 25 25 25   Calcium 8.9 - 10.3 mg/dL 8.9 8.7(L) 9.0  Total Protein 6.5 - 8.1 g/dL - - -  Total Bilirubin 0.3 - 1.2 mg/dL - - -  Alkaline Phos 38 - 126 U/L - - -  AST 15 - 41 U/L - - -  ALT 0 - 44 U/L - - -   CBC Latest Ref Rng & Units 11/21/2021 11/21/2021 11/21/2021  WBC 4.0 - 10.5 K/uL 15.0(H) - 15.9(H)  Hemoglobin 12.0 - 15.0 g/dL 8.0(L) 9.3(L) 9.9(L)  Hematocrit 36.0 - 46.0 % 23.6(L) 28.3(L) 29.1(L)  Platelets 150 - 400 K/uL 182 - 226    RADS: N/a Assessment:   S/p left mastectomy.  JP will still some bleeding but less brisk compared to this am.  JP stripped at bedside and no additional bleeding noted.  Hgb dropped as expected.  Will need to consider tranfusion if it drops below 8 at repeat lab work.  Continue to hold all anticoagulation.  Pending coag studies. No bleeding on prevena sponge

## 2021-11-21 NOTE — Anesthesia Procedure Notes (Signed)
Procedure Name: LMA Insertion Date/Time: 11/21/2021 9:42 AM Performed by: Hedda Slade, CRNA Pre-anesthesia Checklist: Patient identified, Patient being monitored, Timeout performed, Emergency Drugs available and Suction available Patient Re-evaluated:Patient Re-evaluated prior to induction Oxygen Delivery Method: Circle system utilized Preoxygenation: Pre-oxygenation with 100% oxygen Induction Type: IV induction Ventilation: Mask ventilation without difficulty LMA: LMA inserted LMA Size: 3.5 Tube type: Oral Number of attempts: 1 Placement Confirmation: positive ETCO2 and breath sounds checked- equal and bilateral Tube secured with: Tape Dental Injury: Teeth and Oropharynx as per pre-operative assessment

## 2021-11-21 NOTE — Progress Notes (Addendum)
Subjective:  CC: Cindy Huerta is a 75 y.o. female  Hospital stay day 0, 1 Day Post-Op left mastectomy  HPI: No reported issues from patient, but RN reported brief period of LOC while on commode last night.  Stable since.   Denies any pain.  ROS:  General: Denies weight loss, weight gain, fatigue, fevers, chills, and night sweats. Heart: Denies chest pain, palpitations, racing heart, irregular heartbeat, leg pain or swelling, and decreased activity tolerance. Respiratory: Denies breathing difficulty, shortness of breath, wheezing, cough, and sputum. GI: Denies change in appetite, heartburn, nausea, vomiting, constipation, diarrhea, and blood in stool. GU: Denies difficulty urinating, pain with urinating, urgency, frequency, blood in urine.   Objective:   Temp:  [97 F (36.1 C)-98.1 F (36.7 C)] 98.1 F (36.7 C) (12/17 0509) Pulse Rate:  [63-82] 82 (12/17 0509) Resp:  [12-35] 16 (12/17 0509) BP: (99-190)/(61-94) 99/63 (12/17 0509) SpO2:  [98 %-100 %] 99 % (12/17 0509) Weight:  [77.1 kg] 77.1 kg (12/16 1124)     Height: 5\' 6"  (167.6 cm) Weight: 77.1 kg BMI (Calculated): 27.45   Intake/Output this shift:   Intake/Output Summary (Last 24 hours) at 11/21/2021 0835 Last data filed at 11/21/2021 0730 Gross per 24 hour  Intake 350 ml  Output 450 ml  Net -100 ml    Constitutional :  alert, cooperative, appears stated age, and no distress  Respiratory:  clear to auscultation bilaterally  Cardiovascular:  regular rate and rhythm  Gastrointestinal: soft, non-tender; bowel sounds normal; no masses,  no organomegaly.   Skin: Cool and moist. Mastectomy site soft, with some bruising noted, no major swelling.  JP full of dark venous blood.  Psychiatric: Normal affect, non-agitated, not confused       LABS:  CMP Latest Ref Rng & Units 11/21/2021 10/10/2021 10/09/2021  Glucose 70 - 99 mg/dL 134(H) 105(H) 135(H)  BUN 8 - 23 mg/dL 22 <5(L) 6(L)  Creatinine 0.44 - 1.00 mg/dL 1.66(H) 0.92  0.98  Sodium 135 - 145 mmol/L 133(L) 136 137  Potassium 3.5 - 5.1 mmol/L 4.4 3.6 3.0(L)  Chloride 98 - 111 mmol/L 100 104 103  CO2 22 - 32 mmol/L 25 25 25   Calcium 8.9 - 10.3 mg/dL 8.9 8.7(L) 9.0  Total Protein 6.5 - 8.1 g/dL - - -  Total Bilirubin 0.3 - 1.2 mg/dL - - -  Alkaline Phos 38 - 126 U/L - - -  AST 15 - 41 U/L - - -  ALT 0 - 44 U/L - - -   CBC Latest Ref Rng & Units 11/21/2021 10/10/2021 10/09/2021  WBC 4.0 - 10.5 K/uL 15.9(H) 10.5 10.8(H)  Hemoglobin 12.0 - 15.0 g/dL 9.9(L) 10.3(L) 10.0(L)  Hematocrit 36.0 - 46.0 % 29.1(L) 31.7(L) 31.0(L)  Platelets 150 - 400 K/uL 226 202 193    RADS: N/a Assessment:   S/p left mastectomy.  No obvious swelling around incision site but prevena vac noted to have bloody discharge, and JP had moderate amount of output recorded overnight.  Stripping of contents noted additional discharge of same old bloody output.  Drainage slowed after stripping.  Pt still complains of minimal pain, but degree of blood on vac and JP concerning for old episode of bleeding vs slow venous output at this time.  Will monitor over next couple hours and see if output further slows down, but explained to pt and son re-exploration maybe a possibility today if she continues to have moderate amount of output from JP drain.  Hold lovenox, BP  meds, and recheck hgb in couple hours in the meantime.  UPDATE: recheck in just a few minutes and bulb already full of venous blood again.  Will proceed with exploration in OR.  R/B/A discussed and they wish to proceed.

## 2021-11-21 NOTE — Transfer of Care (Signed)
Immediate Anesthesia Transfer of Care Note  Patient: Cindy Huerta  Procedure(s) Performed: EVACUATION HEMATOMA (Left)  Patient Location: PACU  Anesthesia Type:General  Level of Consciousness: awake, alert  and oriented  Airway & Oxygen Therapy: Patient Spontanous Breathing  Post-op Assessment: Report given to RN and Post -op Vital signs reviewed and stable  Post vital signs: Reviewed and stable  Last Vitals:  Vitals Value Taken Time  BP 136/68 11/21/21 1047  Temp 36.1 C 11/21/21 1047  Pulse 73 11/21/21 1048  Resp 12 11/21/21 1047  SpO2 100 % 11/21/21 1048  Vitals shown include unvalidated device data.  Last Pain:  Vitals:   11/21/21 1047  TempSrc:   PainSc: 0-No pain         Complications: No notable events documented.

## 2021-11-21 NOTE — Op Note (Signed)
Pre-Op Dx:Marland Kitchen  Bleeding post op Post-Op Dx: Same Anesthesia: LMA EBL: 30 mL Intra-Op, 350 mL total loss preop Complications:  none apparent Specimen: None Procedure: Left breast mastectomy wound reexploration and hematoma evacuation Surgeon: Lysle Pearl  Indications for procedure: See H&P  Description of Procedure:  Consent obtained, time out performed.  Patient placed in supine position.  Area sterilized and draped in usual position.  Staples removed and large amounts of clots evacuated from entire mastectomy site.  Evaluation of the operative field noted several small areas of slow venous bleeds, mostly within the pectoralis major muscle area, and a couple areas within the sentinel lymph node biopsy site.  These were all controlled with combination of electrocautery and 3-0 silk suture ligation.  Wound then extensively irrigated.  Operative field then inspected and sections over 15 minutes total to ensure no evidence of active bleeding.  Arista hemostatic powder was then infused throughout the entire operative field for further insurance of continued hemostasis.  last look throughout the entire operative field again noted no signs of active bleeding.    Wound closed in two layer fashion with 3-0 vicryl in interrupted fashion for deep dermal layer, then staples for skin.  Wound then dressed with Prevena wound VAC.  Former drain remained intact and area dressed with drain sponge.  Pt tolerated procedure well, and transferred to PACU in stable condition.  Prior to transfer, JP bulb was noted to only have the old venous blood and no new blood noted within the tubing.  Sponge and instrument count correct at end of procedure.

## 2021-11-21 NOTE — Anesthesia Preprocedure Evaluation (Addendum)
Anesthesia Evaluation  Patient identified by MRN, date of birth, ID band Patient awake and Patient confused    Reviewed: Allergy & Precautions, NPO status , Patient's Chart, lab work & pertinent test results  History of Anesthesia Complications Negative for: history of anesthetic complications  Airway Mallampati: II  TM Distance: >3 FB Neck ROM: full    Dental  (+) Upper Dentures, Lower Dentures, Dental Advidsory Given   Pulmonary neg pulmonary ROS,    Pulmonary exam normal  + decreased breath sounds      Cardiovascular Exercise Tolerance: Poor hypertension, Pt. on medications (-) anginaNormal cardiovascular exam(-) dysrhythmias (-) Valvular Problems/Murmurs Rhythm:Regular Rate:Normal     Neuro/Psych PSYCHIATRIC DISORDERS Depression negative neurological ROS     GI/Hepatic negative GI ROS, Neg liver ROS, GERD  ,  Endo/Other  neg diabetesHypothyroidism   Renal/GU Renal diseasenegative Renal ROS  negative genitourinary   Musculoskeletal  (+) Arthritis , Osteoarthritis,    Abdominal (+) + obese,   Peds negative pediatric ROS (+)  Hematology negative hematology ROS (+)   Anesthesia Other Findings Past Medical History: No date: Breast cancer ([place]) No date: Depression No date: HLD (hyperlipidemia) No date: Hypertension No date: Hypothyroidism  Past Surgical History: 09/23/2021: BREAST BIOPSY; Left     Comment:  3:00 13cmfn venus marker, path pending 09/23/2021: BREAST BIOPSY; Left     Comment:  3:00 14 cmfn heart marker, path pending No date: CHOLECYSTECTOMY No date: left lumpectomy  BMI    Body Mass Index: 27.76 kg/m      Reproductive/Obstetrics negative OB ROS                            Anesthesia Physical  Anesthesia Plan  ASA: 3 and emergent  Anesthesia Plan: General   Post-op Pain Management:    Induction: Intravenous  PONV Risk Score and Plan: 3 and Ondansetron,  Dexamethasone and Treatment may vary due to age or medical condition  Airway Management Planned: LMA  Additional Equipment:   Intra-op Plan:   Post-operative Plan: Extubation in OR  Informed Consent: I have reviewed the patients History and Physical, chart, labs and discussed the procedure including the risks, benefits and alternatives for the proposed anesthesia with the patient or authorized representative who has indicated his/her understanding and acceptance.     Dental Advisory Given  Plan Discussed with: CRNA and Surgeon  Anesthesia Plan Comments:        Anesthesia Quick Evaluation

## 2021-11-22 ENCOUNTER — Encounter: Payer: Self-pay | Admitting: Surgery

## 2021-11-22 DIAGNOSIS — F419 Anxiety disorder, unspecified: Secondary | ICD-10-CM | POA: Diagnosis present

## 2021-11-22 DIAGNOSIS — Z888 Allergy status to other drugs, medicaments and biological substances status: Secondary | ICD-10-CM | POA: Diagnosis not present

## 2021-11-22 DIAGNOSIS — E785 Hyperlipidemia, unspecified: Secondary | ICD-10-CM | POA: Diagnosis present

## 2021-11-22 DIAGNOSIS — Y838 Other surgical procedures as the cause of abnormal reaction of the patient, or of later complication, without mention of misadventure at the time of the procedure: Secondary | ICD-10-CM | POA: Diagnosis not present

## 2021-11-22 DIAGNOSIS — C50912 Malignant neoplasm of unspecified site of left female breast: Secondary | ICD-10-CM | POA: Diagnosis present

## 2021-11-22 DIAGNOSIS — I951 Orthostatic hypotension: Secondary | ICD-10-CM | POA: Diagnosis present

## 2021-11-22 DIAGNOSIS — R42 Dizziness and giddiness: Secondary | ICD-10-CM | POA: Diagnosis not present

## 2021-11-22 DIAGNOSIS — E039 Hypothyroidism, unspecified: Secondary | ICD-10-CM | POA: Diagnosis present

## 2021-11-22 DIAGNOSIS — M79622 Pain in left upper arm: Secondary | ICD-10-CM | POA: Diagnosis present

## 2021-11-22 DIAGNOSIS — L7622 Postprocedural hemorrhage and hematoma of skin and subcutaneous tissue following other procedure: Secondary | ICD-10-CM | POA: Diagnosis not present

## 2021-11-22 DIAGNOSIS — F32A Depression, unspecified: Secondary | ICD-10-CM | POA: Diagnosis present

## 2021-11-22 DIAGNOSIS — Z170 Estrogen receptor positive status [ER+]: Secondary | ICD-10-CM | POA: Diagnosis not present

## 2021-11-22 DIAGNOSIS — I129 Hypertensive chronic kidney disease with stage 1 through stage 4 chronic kidney disease, or unspecified chronic kidney disease: Secondary | ICD-10-CM | POA: Diagnosis present

## 2021-11-22 DIAGNOSIS — N1831 Chronic kidney disease, stage 3a: Secondary | ICD-10-CM | POA: Diagnosis present

## 2021-11-22 DIAGNOSIS — Z881 Allergy status to other antibiotic agents status: Secondary | ICD-10-CM | POA: Diagnosis not present

## 2021-11-22 DIAGNOSIS — Z885 Allergy status to narcotic agent status: Secondary | ICD-10-CM | POA: Diagnosis not present

## 2021-11-22 DIAGNOSIS — Z91040 Latex allergy status: Secondary | ICD-10-CM | POA: Diagnosis not present

## 2021-11-22 DIAGNOSIS — K219 Gastro-esophageal reflux disease without esophagitis: Secondary | ICD-10-CM | POA: Diagnosis present

## 2021-11-22 DIAGNOSIS — Z8249 Family history of ischemic heart disease and other diseases of the circulatory system: Secondary | ICD-10-CM | POA: Diagnosis not present

## 2021-11-22 DIAGNOSIS — Z79899 Other long term (current) drug therapy: Secondary | ICD-10-CM | POA: Diagnosis not present

## 2021-11-22 DIAGNOSIS — C50919 Malignant neoplasm of unspecified site of unspecified female breast: Secondary | ICD-10-CM | POA: Diagnosis present

## 2021-11-22 DIAGNOSIS — Z20822 Contact with and (suspected) exposure to covid-19: Secondary | ICD-10-CM | POA: Diagnosis present

## 2021-11-22 DIAGNOSIS — Z66 Do not resuscitate: Secondary | ICD-10-CM | POA: Diagnosis not present

## 2021-11-22 DIAGNOSIS — D649 Anemia, unspecified: Secondary | ICD-10-CM | POA: Diagnosis present

## 2021-11-22 DIAGNOSIS — Z90710 Acquired absence of both cervix and uterus: Secondary | ICD-10-CM | POA: Diagnosis not present

## 2021-11-22 DIAGNOSIS — Z8673 Personal history of transient ischemic attack (TIA), and cerebral infarction without residual deficits: Secondary | ICD-10-CM | POA: Diagnosis not present

## 2021-11-22 LAB — CBC
HCT: 24.4 % — ABNORMAL LOW (ref 36.0–46.0)
Hemoglobin: 8.2 g/dL — ABNORMAL LOW (ref 12.0–15.0)
MCH: 29.2 pg (ref 26.0–34.0)
MCHC: 33.6 g/dL (ref 30.0–36.0)
MCV: 86.8 fL (ref 80.0–100.0)
Platelets: 173 10*3/uL (ref 150–400)
RBC: 2.81 MIL/uL — ABNORMAL LOW (ref 3.87–5.11)
RDW: 15 % (ref 11.5–15.5)
WBC: 15.2 10*3/uL — ABNORMAL HIGH (ref 4.0–10.5)
nRBC: 0 % (ref 0.0–0.2)

## 2021-11-22 LAB — BASIC METABOLIC PANEL
Anion gap: 6 (ref 5–15)
BUN: 31 mg/dL — ABNORMAL HIGH (ref 8–23)
CO2: 24 mmol/L (ref 22–32)
Calcium: 8.5 mg/dL — ABNORMAL LOW (ref 8.9–10.3)
Chloride: 101 mmol/L (ref 98–111)
Creatinine, Ser: 1.69 mg/dL — ABNORMAL HIGH (ref 0.44–1.00)
GFR, Estimated: 31 mL/min — ABNORMAL LOW (ref >60)
Glucose, Bld: 135 mg/dL — ABNORMAL HIGH (ref 70–99)
Potassium: 5.1 mmol/L (ref 3.5–5.1)
Sodium: 131 mmol/L — ABNORMAL LOW (ref 135–145)

## 2021-11-22 MED ORDER — LORAZEPAM 0.5 MG PO TABS
0.5000 mg | ORAL_TABLET | Freq: Four times a day (QID) | ORAL | Status: DC | PRN
  Administered 2021-11-22 – 2021-11-23 (×3): 0.5 mg via ORAL
  Filled 2021-11-22 (×3): qty 1

## 2021-11-22 NOTE — Progress Notes (Addendum)
Subjective:  CC: Cindy Huerta is a 75 y.o. female  Hospital stay day 0, POD #2 left mastectomy, POD#1 wound exploration, hematoma evacuation  HPI: No acute issues overnight. Starting to have trouble finding words again, which is a chronic issues for her, especially in stressful situations.  Pain well controlled.  ROS:  General: Denies weight loss, weight gain, fatigue, fevers, chills, and night sweats. Heart: Denies chest pain, palpitations, racing heart, irregular heartbeat, leg pain or swelling, and decreased activity tolerance. Respiratory: Denies breathing difficulty, shortness of breath, wheezing, cough, and sputum. GI: Denies change in appetite, heartburn, nausea, vomiting, constipation, diarrhea, and blood in stool. GU: Denies difficulty urinating, pain with urinating, urgency, frequency, blood in urine.   Objective:   Temp:  [97.8 F (36.6 C)-99.3 F (37.4 C)] 99.3 F (37.4 C) (12/18 0815) Pulse Rate:  [77-82] 77 (12/18 0815) Resp:  [14-20] 14 (12/18 0815) BP: (129-156)/(65-76) 156/76 (12/18 0815) SpO2:  [98 %-100 %] 100 % (12/18 0815)     Height: 5\' 6"  (167.6 cm) Weight: 77.1 kg BMI (Calculated): 27.45   Intake/Output this shift:   Intake/Output Summary (Last 24 hours) at 11/22/2021 1115 Last data filed at 11/22/2021 3976 Gross per 24 hour  Intake 250 ml  Output 160 ml  Net 90 ml    Constitutional :  alert, cooperative, appears stated age, and no distress  Respiratory:  clear to auscultation bilaterally  Cardiovascular:  regular rate and rhythm  Gastrointestinal: soft, non-tender; bowel sounds normal; no masses,  no organomegaly.   Skin: Cool and moist. Mastectomy site soft, with some bruising noted, ACE wrap intact.  JP with dark venous blood, slowed output again this am.  Psychiatric: Normal affect, non-agitated, not confused       LABS:  CMP Latest Ref Rng & Units 11/22/2021 11/21/2021 10/10/2021  Glucose 70 - 99 mg/dL 135(H) 134(H) 105(H)  BUN 8 - 23  mg/dL 31(H) 22 <5(L)  Creatinine 0.44 - 1.00 mg/dL 1.69(H) 1.66(H) 0.92  Sodium 135 - 145 mmol/L 131(L) 133(L) 136  Potassium 3.5 - 5.1 mmol/L 5.1 4.4 3.6  Chloride 98 - 111 mmol/L 101 100 104  CO2 22 - 32 mmol/L 24 25 25   Calcium 8.9 - 10.3 mg/dL 8.5(L) 8.9 8.7(L)  Total Protein 6.5 - 8.1 g/dL - - -  Total Bilirubin 0.3 - 1.2 mg/dL - - -  Alkaline Phos 38 - 126 U/L - - -  AST 15 - 41 U/L - - -  ALT 0 - 44 U/L - - -   CBC Latest Ref Rng & Units 11/22/2021 11/21/2021 11/21/2021  WBC 4.0 - 10.5 K/uL 15.2(H) 15.0(H) -  Hemoglobin 12.0 - 15.0 g/dL 8.2(L) 8.0(L) 9.3(L)  Hematocrit 36.0 - 46.0 % 24.4(L) 23.6(L) 28.3(L)  Platelets 150 - 400 K/uL 173 182 -    RADS: N/a Assessment:   S/p left mastectomy, subsequent hematoma evacuation.  JP output rate has continued to slow this am, with minimal output after stripping at bedside, total of about 15 in bulb at time of exam.  Hgb remains stable as well which is reassuring.  Continue with ACE wrap for now, monitor drain output and swelling in area.  Repeat in am or sooner as needed.  Cr remains elevated.  Will increase fluid rate and monitor for improvement.  Continue diet, pain control in the meantime.

## 2021-11-22 NOTE — Progress Notes (Signed)
Chaplain responded to page from RN Santiago Glad to check in on pt, who has been anxious following surgery.  Pt appears weak, tired, but not as anxious as reported before.  She reports having trouble finding the right words, and this is frustrating; also.  Pt's faith Darrick Meigs) is a very central part of her life. Chaplain provided emotional and spiritual support, including prayer and active listening.    One of pt's two sons, Marra Fraga, is a PRN chaplain at [employer].  We will continue to follow, but contact as needed for more immediate support.   Minus [place], [place] [phone]     11/22/21 1000  Clinical Encounter Type  Visited With Patient and family together  Visit Type Initial;Spiritual support  Referral From Patient  Spiritual Encounters  Spiritual Needs Prayer  Stress Factors  Patient Stress Factors Health changes

## 2021-11-23 LAB — BASIC METABOLIC PANEL
Anion gap: 1 — ABNORMAL LOW (ref 5–15)
BUN: 25 mg/dL — ABNORMAL HIGH (ref 8–23)
CO2: 25 mmol/L (ref 22–32)
Calcium: 8.4 mg/dL — ABNORMAL LOW (ref 8.9–10.3)
Chloride: 109 mmol/L (ref 98–111)
Creatinine, Ser: 1.04 mg/dL — ABNORMAL HIGH (ref 0.44–1.00)
GFR, Estimated: 55 mL/min — ABNORMAL LOW (ref >60)
Glucose, Bld: 87 mg/dL (ref 70–99)
Potassium: 4.3 mmol/L (ref 3.5–5.1)
Sodium: 135 mmol/L (ref 135–145)

## 2021-11-23 LAB — CBC WITH DIFFERENTIAL/PLATELET
Abs Immature Granulocytes: 0.08 10*3/uL — ABNORMAL HIGH (ref 0.00–0.07)
Basophils Absolute: 0 10*3/uL (ref 0.0–0.1)
Basophils Relative: 0 %
Eosinophils Absolute: 0.1 10*3/uL (ref 0.0–0.5)
Eosinophils Relative: 1 %
HCT: 21.7 % — ABNORMAL LOW (ref 36.0–46.0)
Hemoglobin: 7.2 g/dL — ABNORMAL LOW (ref 12.0–15.0)
Immature Granulocytes: 1 %
Lymphocytes Relative: 19 %
Lymphs Abs: 2.2 10*3/uL (ref 0.7–4.0)
MCH: 29.5 pg (ref 26.0–34.0)
MCHC: 33.2 g/dL (ref 30.0–36.0)
MCV: 88.9 fL (ref 80.0–100.0)
Monocytes Absolute: 1.4 10*3/uL — ABNORMAL HIGH (ref 0.1–1.0)
Monocytes Relative: 12 %
Neutro Abs: 7.9 10*3/uL — ABNORMAL HIGH (ref 1.7–7.7)
Neutrophils Relative %: 67 %
Platelets: 161 10*3/uL (ref 150–400)
RBC: 2.44 MIL/uL — ABNORMAL LOW (ref 3.87–5.11)
RDW: 15.5 % (ref 11.5–15.5)
WBC: 11.6 10*3/uL — ABNORMAL HIGH (ref 4.0–10.5)
nRBC: 0 % (ref 0.0–0.2)

## 2021-11-23 LAB — HEMOGLOBIN AND HEMATOCRIT, BLOOD
HCT: 26.7 % — ABNORMAL LOW (ref 36.0–46.0)
Hemoglobin: 9.1 g/dL — ABNORMAL LOW (ref 12.0–15.0)

## 2021-11-23 LAB — PREPARE RBC (CROSSMATCH)

## 2021-11-23 MED ORDER — HYDRALAZINE HCL 25 MG PO TABS
25.0000 mg | ORAL_TABLET | Freq: Three times a day (TID) | ORAL | Status: DC
  Administered 2021-11-23 – 2021-11-24 (×3): 25 mg via ORAL
  Filled 2021-11-23 (×3): qty 1

## 2021-11-23 MED ORDER — SODIUM CHLORIDE 0.9% IV SOLUTION: Freq: Once | INTRAVENOUS | Status: AC

## 2021-11-23 MED ORDER — METOPROLOL SUCCINATE ER 50 MG PO TB24
50.0000 mg | ORAL_TABLET | Freq: Two times a day (BID) | ORAL | Status: DC
  Administered 2021-11-23 – 2021-11-25 (×4): 50 mg via ORAL
  Filled 2021-11-23 (×4): qty 1

## 2021-11-23 MED ORDER — IRBESARTAN 150 MG PO TABS
300.0000 mg | ORAL_TABLET | Freq: Every day | ORAL | Status: DC
  Administered 2021-11-23 – 2021-11-25 (×3): 300 mg via ORAL
  Filled 2021-11-23 (×3): qty 2

## 2021-11-23 NOTE — Progress Notes (Signed)
Chaplain Maggie made initial visit with pt per referral for follow up from chaplain. Pt was engaging while waiting for her breakfast to arrive. Storytelling, listening, and prayer were central to this visit. Pt relies on her faith to trust Christ to give her the strength to bear the discomfort she feels after surgery. She expressed the experience of her hospitalization her been very positive. She noted she has been treated with kindness and care.Pt anticipates going home today.

## 2021-11-23 NOTE — Progress Notes (Signed)
PT Cancellation Note  Patient Details Name: Cindy Huerta MRN: [id number] DOB: [birth year]   Cancelled Treatment:    Reason Eval/Treat Not Completed: Other (comment). Per RN pt is dizzy, anxious, nauseous. Just recently administered medication. Pt also pending a hg transfusion. PT to re-attempt when pt is more appropriate for exertional activity.   Lieutenant Diego PT, DPT 10:24 AM,11/23/21

## 2021-11-23 NOTE — Progress Notes (Addendum)
Subjective:  CC: Cindy Huerta is a 75 y.o. female  Hospital stay day 1, POD #3 left mastectomy, POD#2 wound exploration, hematoma evacuation  HPI: No acute issues overnight. No pain.  JP with serosanguinous fluid in tubing.  ROS:  General: Denies weight loss, weight gain, fatigue, fevers, chills, and night sweats. Heart: Denies chest pain, palpitations, racing heart, irregular heartbeat, leg pain or swelling, and decreased activity tolerance. Respiratory: Denies breathing difficulty, shortness of breath, wheezing, cough, and sputum. GI: Denies change in appetite, heartburn, nausea, vomiting, constipation, diarrhea, and blood in stool. GU: Denies difficulty urinating, pain with urinating, urgency, frequency, blood in urine.   Objective:   Temp:  [98 F (36.7 C)-98.2 F (36.8 C)] 98.2 F (36.8 C) (12/19 1435) Pulse Rate:  [75-98] 91 (12/19 1435) Resp:  [16-18] 16 (12/19 1435) BP: (149-170)/(62-75) 155/72 (12/19 1435) SpO2:  [96 %-98 %] 98 % (12/19 0831)     Height: 5\' 6"  (167.6 cm) Weight: 77.1 kg BMI (Calculated): 27.45   Intake/Output this shift:   Intake/Output Summary (Last 24 hours) at 11/23/2021 1550 Last data filed at 11/23/2021 1430 Gross per 24 hour  Intake 180 ml  Output 1710 ml  Net -1530 ml    Constitutional :  alert, cooperative, appears stated age, and no distress  Respiratory:  clear to auscultation bilaterally  Cardiovascular:  regular rate and rhythm  Gastrointestinal: soft, non-tender; bowel sounds normal; no masses,  no organomegaly.   Skin: Cool and moist. Mastectomy site soft, with some bruising noted, ACE wrap intact.  JP with serosanguinous fluid, total output 110 from past 24hrs, down from 230 to 160 to 110 for past three days   Psychiatric: Normal affect, non-agitated, not confused       LABS:  CMP Latest Ref Rng & Units 11/23/2021 11/22/2021 11/21/2021  Glucose 70 - 99 mg/dL 87 135(H) 134(H)  BUN 8 - 23 mg/dL 25(H) 31(H) 22  Creatinine 0.44 -  1.00 mg/dL 1.04(H) 1.69(H) 1.66(H)  Sodium 135 - 145 mmol/L 135 131(L) 133(L)  Potassium 3.5 - 5.1 mmol/L 4.3 5.1 4.4  Chloride 98 - 111 mmol/L 109 101 100  CO2 22 - 32 mmol/L 25 24 25   Calcium 8.9 - 10.3 mg/dL 8.4(L) 8.5(L) 8.9  Total Protein 6.5 - 8.1 g/dL - - -  Total Bilirubin 0.3 - 1.2 mg/dL - - -  Alkaline Phos 38 - 126 U/L - - -  AST 15 - 41 U/L - - -  ALT 0 - 44 U/L - - -   CBC Latest Ref Rng & Units 11/23/2021 11/22/2021 11/21/2021  WBC 4.0 - 10.5 K/uL 11.6(H) 15.2(H) 15.0(H)  Hemoglobin 12.0 - 15.0 g/dL 7.2(L) 8.2(L) 8.0(L)  Hematocrit 36.0 - 46.0 % 21.7(L) 24.4(L) 23.6(L)  Platelets 150 - 400 K/uL 161 173 182    RADS: N/a Assessment:   S/p left mastectomy, subsequent hematoma evacuation.  JP output rate has continued to slow this am, with minimal output after stripping at bedside, total of about 20ml in bulb at time of exam this am serosanguinous.    Unfortunately hgb dropped again from yesterday to 7.1.  took prevena vac off and released negative pressure on JP to see if that will be enough to stop the intermittent bleeding that is likely causing her issues.  Staple line looks well, no sign of active bleeding.  Still soft in all areas including axilla.   1 upRBC transfusion in the progress this pm.  Drain had additional 42ml or so of slightly  thinned out but more sanguinous output again.  Will monitor overnight and see if this can self tamponade at this point, since a second look operation even less likely to yield any obvious sites of bleeding at this point.  Cr decreasing in the meantime, will restart all home BP meds to make sure to minimize further bleeding issues

## 2021-11-24 DIAGNOSIS — E039 Hypothyroidism, unspecified: Secondary | ICD-10-CM

## 2021-11-24 DIAGNOSIS — D649 Anemia, unspecified: Secondary | ICD-10-CM | POA: Diagnosis present

## 2021-11-24 DIAGNOSIS — I10 Essential (primary) hypertension: Secondary | ICD-10-CM | POA: Diagnosis present

## 2021-11-24 DIAGNOSIS — N1831 Chronic kidney disease, stage 3a: Secondary | ICD-10-CM

## 2021-11-24 DIAGNOSIS — E785 Hyperlipidemia, unspecified: Secondary | ICD-10-CM

## 2021-11-24 DIAGNOSIS — C50912 Malignant neoplasm of unspecified site of left female breast: Principal | ICD-10-CM

## 2021-11-24 DIAGNOSIS — R42 Dizziness and giddiness: Secondary | ICD-10-CM

## 2021-11-24 DIAGNOSIS — Z8673 Personal history of transient ischemic attack (TIA), and cerebral infarction without residual deficits: Secondary | ICD-10-CM

## 2021-11-24 DIAGNOSIS — K219 Gastro-esophageal reflux disease without esophagitis: Secondary | ICD-10-CM

## 2021-11-24 LAB — BPAM RBC
Blood Product Expiration Date: 202212212359
ISSUE DATE / TIME: 202212191407
Unit Type and Rh: 9500

## 2021-11-24 LAB — PROTIME-INR
INR: 1.2 (ref 0.8–1.2)
Prothrombin Time: 15.1 seconds (ref 11.4–15.2)

## 2021-11-24 LAB — CBC
HCT: 25.2 % — ABNORMAL LOW (ref 36.0–46.0)
Hemoglobin: 8.4 g/dL — ABNORMAL LOW (ref 12.0–15.0)
MCH: 28.9 pg (ref 26.0–34.0)
MCHC: 33.3 g/dL (ref 30.0–36.0)
MCV: 86.6 fL (ref 80.0–100.0)
Platelets: 188 10*3/uL (ref 150–400)
RBC: 2.91 MIL/uL — ABNORMAL LOW (ref 3.87–5.11)
RDW: 17.2 % — ABNORMAL HIGH (ref 11.5–15.5)
WBC: 10.5 10*3/uL (ref 4.0–10.5)
nRBC: 0 % (ref 0.0–0.2)

## 2021-11-24 LAB — TYPE AND SCREEN
ABO/RH(D): AB POS
Antibody Screen: NEGATIVE
Unit division: 0

## 2021-11-24 LAB — BASIC METABOLIC PANEL
Anion gap: 4 — ABNORMAL LOW (ref 5–15)
BUN: 19 mg/dL (ref 8–23)
CO2: 25 mmol/L (ref 22–32)
Calcium: 8.7 mg/dL — ABNORMAL LOW (ref 8.9–10.3)
Chloride: 107 mmol/L (ref 98–111)
Creatinine, Ser: 0.99 mg/dL (ref 0.44–1.00)
GFR, Estimated: 59 mL/min — ABNORMAL LOW (ref >60)
Glucose, Bld: 95 mg/dL (ref 70–99)
Potassium: 4.1 mmol/L (ref 3.5–5.1)
Sodium: 136 mmol/L (ref 135–145)

## 2021-11-24 MED ORDER — HYDRALAZINE HCL 20 MG/ML IJ SOLN
5.0000 mg | INTRAMUSCULAR | Status: DC | PRN
  Administered 2021-11-24 (×2): 5 mg via INTRAVENOUS
  Filled 2021-11-24 (×3): qty 1

## 2021-11-24 MED ORDER — HYDRALAZINE HCL 50 MG PO TABS
100.0000 mg | ORAL_TABLET | Freq: Three times a day (TID) | ORAL | Status: DC
  Administered 2021-11-24 – 2021-11-25 (×4): 100 mg via ORAL
  Filled 2021-11-24 (×4): qty 2

## 2021-11-24 NOTE — Progress Notes (Signed)
Chaplain responded to Rapid Code. Prayer emotional support

## 2021-11-24 NOTE — Consult Note (Signed)
Medical Consultation   Cindy Huerta  VHQ:[id number]  DOB: 25-Oct-1946  DOA: 11/20/2021  PCP: Juluis Pitch, MD  Outpatient Specialists:    Requesting physician: -Dr. Lysle Pearl of surgery  Reason for consultation: -elevated blood pressure and dizziness    History of Present Illness: Cindy Huerta is an 75 y.o. female with past medical history of hypertension, hyperlipidemia, TIA, GERD, hypothyroidism, anxiety, left breast cancer, CKD-3A, GI bleeding, anemia, who is admitted by Dr. Lysle Pearl of general surgery for left mastectomy.  We are asked to consult due to elevated blood pressure and dizziness.  Per Dr. Lysle Pearl, patient is POD #4, s/p of left mastectomy.  Patient still has some bleeding from surgical site.  Patient was noted to have elevated blood pressure with systolic blood pressure [phone].  Patient is currently taking hydralazine 25 mg 3 times daily, irbesartan 300 mg daily and metoprolol 50 mg twice daily.  Patient denies any chest pain, cough, shortness of breath.  No fever or chills.  Patient does not have nausea, vomiting, diarrhea or abdominal pain, no symptoms of UTI.  Patient states that she has history of vertigo.  Today she felt dizzy with feeling of room spinning around her.  No ear ringing or hearing loss.  Patient does not have unilateral numbness or tingling to extremities.  No facial droop or slurred speech.   Lab: WBC 10.5, INR 1.2, hemoglobin 8.4 (13.3 on 10/10/2021), electrolytes renal function okay, temperature normal, blood pressure 170/67, heart rate 98, RR 16, oxygen saturation 98% on room air.  EKG: not done yet, will get one   Review of Systems:   General: no fevers, chills, no changes in body weight, no changes in appetite Skin: no rash HEENT: no blurry vision, hearing changes or sore throat Pulm: no dyspnea, coughing, wheezing CV: no chest pain, palpitations, shortness of breath Abd: no nausea/vomiting, abdominal pain,  diarrhea/constipation GU: no dysuria, hematuria, polyuria Ext: no arthralgias, myalgias Neuro: no weakness, numbness, or tingling. Has dizziness. Musculoskeletal: S/p of left mastectomy.  Has pain in surgical site    Past Medical History: Past Medical History:  Diagnosis Date   Arthritis    Breast cancer ([place])    Depression    GERD (gastroesophageal reflux disease)    HLD (hyperlipidemia)    Hypertension    Hypothyroidism     Past Surgical History: Past Surgical History:  Procedure Laterality Date   ABDOMINAL HYSTERECTOMY  1982   AXILLARY SENTINEL NODE BIOPSY Left 11/20/2021   Procedure: AXILLARY SENTINEL NODE BIOPSY;  Surgeon: Benjamine Sprague, DO;  Location: [facility] ORS;  Service: General;  Laterality: Left;   BREAST BIOPSY Left 09/23/2021   3:00 13cmfn venus marker, path pending   BREAST BIOPSY Left 09/23/2021   3:00 14 cmfn heart marker, path pending   CHOLECYSTECTOMY     ESOPHAGOGASTRODUODENOSCOPY (EGD) WITH PROPOFOL N/A 10/08/2021   Procedure: ESOPHAGOGASTRODUODENOSCOPY (EGD) WITH PROPOFOL;  Surgeon: Jonathon Bellows, MD;  Location: [facility] ENDOSCOPY;  Service: Gastroenterology;  Laterality: N/A;   HEMATOMA EVACUATION Left 11/21/2021   Procedure: EVACUATION HEMATOMA;  Surgeon: Benjamine Sprague, DO;  Location: [facility] ORS;  Service: General;  Laterality: Left;   left lumpectomy  2019   TOTAL MASTECTOMY Left 11/20/2021   Procedure: TOTAL MASTECTOMY;  Surgeon: Benjamine Sprague, DO;  Location: [facility] ORS;  Service: General;  Laterality: Left;     Allergies:   Allergies  Allergen Reactions   Latex Rash   Lisinopril  Cough   Procaine Other (See Comments)    Unsure - told by DDS not to let anyone give it to her  Confusion    Diphenhydramine Hcl Other (See Comments)    States she was told to not take - jitteriness and agitation Not sure of reaction    Clarithromycin Other (See Comments)    Confusion     Oxycodone Nausea And Vomiting and Anxiety     Social History:  reports that she  has never smoked. She has never used smokeless tobacco. She reports that she does not drink alcohol and does not use drugs.   Family History: Family History  Problem Relation Age of Onset   Heart disease Mother    Cancer Paternal Grandmother    Physical Exam: Vitals:   11/24/21 0806 11/24/21 1122 11/24/21 1537 11/24/21 1544  BP: (!) 183/87 (!) 170/67 (!) 145/71 (!) 150/80  Pulse: 67 70  87  Resp: 16   16  Temp: 97.6 F (36.4 C)   98.4 F (36.9 C)  TempSrc: Oral   Oral  SpO2: 98%   97%  Weight:      Height:        General: Not in acute distress HEENT: PERRL, EOMI, no scleral icterus, No JVD or bruit Cardiac: S1/S2, RRR, No murmurs, gallops or rubs Pulm:  No rales, wheezing, rhonchi or rubs. Abd: Soft, nondistended, nontender, no rebound pain, no organomegaly, BS present Ext: No edema. 2+DP/PT pulse bilaterally Musculoskeletal: S/p of left mastectomy.  Has pain in surgical site. Skin: No rashes.  Neuro: Alert and oriented X3, cranial nerves II-XII grossly intact, muscle strength 5/5 in all extremeties, sensation to light touch intact.  Psych: Patient is not psychotic, no suicidal or hemocidal ideation.   Data reviewed:  I have personally reviewed following labs and imaging studies Labs:  CBC: Recent Labs  Lab 11/21/21 0432 11/21/21 1221 11/21/21 2214 11/22/21 0521 11/23/21 0704 11/23/21 1848 11/24/21 0450  WBC 15.9*  --  15.0* 15.2* 11.6*  --  10.5  NEUTROABS  --   --   --   --  7.9*  --   --   HGB 9.9*   < > 8.0* 8.2* 7.2* 9.1* 8.4*  HCT 29.1*   < > 23.6* 24.4* 21.7* 26.7* 25.2*  MCV 87.1  --  87.7 86.8 88.9  --  86.6  PLT 226  --  182 173 161  --  188   < > = values in this interval not displayed.    Basic Metabolic Panel: Recent Labs  Lab 11/21/21 0432 11/22/21 0521 11/23/21 0704 11/24/21 0450  NA 133* 131* 135 136  K 4.4 5.1 4.3 4.1  CL 100 101 109 107  CO2 25 24 25 25   GLUCOSE 134* 135* 87 95  BUN 22 31* 25* 19  CREATININE 1.66* 1.69* 1.04*  0.99  CALCIUM 8.9 8.5* 8.4* 8.7*   GFR Estimated Creatinine Clearance: 49.9 mL/min (by C-G formula based on SCr of 0.99 mg/dL). Liver Function Tests: No results for input(s): AST, ALT, ALKPHOS, BILITOT, PROT, ALBUMIN in the last 168 hours. No results for input(s): LIPASE, AMYLASE in the last 168 hours. No results for input(s): AMMONIA in the last 168 hours. Coagulation profile Recent Labs  Lab 11/21/21 2214 11/24/21 0450  INR 1.3* 1.2    Cardiac Enzymes: No results for input(s): CKTOTAL, CKMB, CKMBINDEX, TROPONINI in the last 168 hours. BNP: Invalid input(s): POCBNP CBG: Recent Labs  Lab 11/20/21 1745  GLUCAP 160*  D-Dimer No results for input(s): DDIMER in the last 72 hours. Hgb A1c No results for input(s): HGBA1C in the last 72 hours. Lipid Profile No results for input(s): CHOL, HDL, LDLCALC, TRIG, CHOLHDL, LDLDIRECT in the last 72 hours. Thyroid function studies No results for input(s): TSH, T4TOTAL, T3FREE, THYROIDAB in the last 72 hours.  Invalid input(s): FREET3 Anemia work up No results for input(s): VITAMINB12, FOLATE, FERRITIN, TIBC, IRON, RETICCTPCT in the last 72 hours. Urinalysis    Component Value Date/Time   COLORURINE STRAW (A) 07/29/2021 0743   APPEARANCEUR CLEAR (A) 07/29/2021 0743   LABSPEC 1.004 (L) 07/29/2021 0743   PHURINE 7.0 07/29/2021 0743   GLUCOSEU NEGATIVE 07/29/2021 0743   HGBUR NEGATIVE 07/29/2021 0743   BILIRUBINUR NEGATIVE 07/29/2021 0743   KETONESUR NEGATIVE 07/29/2021 0743   PROTEINUR NEGATIVE 07/29/2021 0743   NITRITE NEGATIVE 07/29/2021 0743   LEUKOCYTESUR NEGATIVE 07/29/2021 0743     Microbiology Recent Results (from the past 240 hour(s))  Resp Panel by RT-PCR (Flu A&B, Covid) Nasopharyngeal Swab     Status: None   Collection Time: 11/20/21  4:12 PM   Specimen: Nasopharyngeal Swab; Nasopharyngeal(NP) swabs in vial transport medium  Result Value Ref Range Status   SARS Coronavirus 2 by RT PCR NEGATIVE NEGATIVE Final     Comment: (NOTE) SARS-CoV-2 target nucleic acids are NOT DETECTED.  The SARS-CoV-2 RNA is generally detectable in upper respiratory specimens during the acute phase of infection. The lowest concentration of SARS-CoV-2 viral copies this assay can detect is 138 copies/mL. A negative result does not preclude SARS-Cov-2 infection and should not be used as the sole basis for treatment or other patient management decisions. A negative result may occur with  improper specimen collection/handling, submission of specimen other than nasopharyngeal swab, presence of viral mutation(s) within the areas targeted by this assay, and inadequate number of viral copies(<138 copies/mL). A negative result must be combined with clinical observations, patient history, and epidemiological information. The expected result is Negative.  Fact Sheet for Patients:  EntrepreneurPulse.com.au  Fact Sheet for Healthcare Providers:  IncredibleEmployment.be  This test is no t yet approved or cleared by the [place] FDA and  has been authorized for detection and/or diagnosis of SARS-CoV-2 by FDA under an Emergency Use Authorization (EUA). This EUA will remain  in effect (meaning this test can be used) for the duration of the COVID-19 declaration under Section 564(b)(1) of the Act, 21 U.S.C.section 360bbb-3(b)(1), unless the authorization is terminated  or revoked sooner.       Influenza A by PCR NEGATIVE NEGATIVE Final   Influenza B by PCR NEGATIVE NEGATIVE Final    Comment: (NOTE) The Xpert Xpress SARS-CoV-2/FLU/RSV plus assay is intended as an aid in the diagnosis of influenza from Nasopharyngeal swab specimens and should not be used as a sole basis for treatment. Nasal washings and aspirates are unacceptable for Xpert Xpress SARS-CoV-2/FLU/RSV testing.  Fact Sheet for Patients: EntrepreneurPulse.com.au  Fact Sheet for Healthcare  Providers: IncredibleEmployment.be  This test is not yet approved or cleared by the [place] FDA and has been authorized for detection and/or diagnosis of SARS-CoV-2 by FDA under an Emergency Use Authorization (EUA). This EUA will remain in effect (meaning this test can be used) for the duration of the COVID-19 declaration under Section 564(b)(1) of the Act, 21 U.S.C. section 360bbb-3(b)(1), unless the authorization is terminated or revoked.  Performed at [facility], [address] [place], [place] [address]        Inpatient Medications:  Scheduled Meds:  atorvastatin  80 mg Oral Daily   gabapentin  300 mg Oral BID   hydrALAZINE  100 mg Oral TID   irbesartan  300 mg Oral Daily   levothyroxine  75 mcg Oral QAC breakfast   magnesium oxide  400 mg Oral Daily   metoprolol succinate  50 mg Oral BID   multivitamin with minerals  1 tablet Oral Daily   pantoprazole  40 mg Oral Daily   vitamin B-12  50 mcg Oral BID   Continuous Infusions:   Radiological Exams on Admission: No results found.  Impression/Recommendations Principal Problem:   Breast cancer ([place]) Active Problems:   HLD (hyperlipidemia)   Hypothyroidism   CKD (chronic kidney disease), stage IIIa   GERD (gastroesophageal reflux disease)   History of TIA (transient ischemic attack)   Breast CA ([facility])   HTN (hypertension)   Normocytic anemia   Vertigo  Breast cancer [facility]): Patient is POD #4, s/p of left mastectomy. Pt still has some bleeding -Management per primary surgery team -Pain control  Normocytic anemia: Hemoglobin 8.4 (13.3 on 10/10/2021).  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  No focal neurodeficit on physical examination. -As needed meclizine -Check orthostatic vital sign -If no improvement, may consider MRI of the brain to rule out stroke    Thank you for this consultation.  Our [facility] hospitalist team will follow the patient with you.   Time Spent: 30 min  Ivor Costa M.D. [facility] 11/24/2021, 4:32 PM

## 2021-11-24 NOTE — Progress Notes (Addendum)
1055-Writer called to room by charge nurse. Rapid response nurse Charlene and PT at bedside. Patient talking. Alert and oriented x 4 complaints of dizziness and elevated BP after standing. Dr. Lysle Pearl notified with orders to recheck BP in 30 minutes. Nurse rechecked BP and charted vitals as well as notifying Dr. Lysle Pearl of elevated BP and dizziness after BP medications and Meclizine were given previously. After patient rested in bed for approximately 5 minutes BP was retaken and trending down. No new orders given from Dr. Lysle Pearl except to continue to monitor patient for dizziness and BP and he might refer to hospitalist. 1307- Consult with hospitalist given

## 2021-11-24 NOTE — Significant Event (Signed)
Rapid Response Event Note   Reason for Call :   Altered mental status Initial Focused Assessment:  Per bedside RN- and PT- patient got up from bed to work with PT and patient became dizzy and had difficult time with "word finding."     Interventions:  Patient placed back in bed- BP 163 systolic- then 845- otherwise other vitals stable. Speech improving- MD notified- bedside RN to administer medication for dizziness.  Waiting for pending order from MD for medication for blood pressure and possible CT of head.  Plan of Care:     Event Summary:   MD Notified: Dr. Lysle Pearl  Call Time:10:45 Arrival Time: N/A End Time:11:00  Penne Lash, RN

## 2021-11-24 NOTE — Progress Notes (Addendum)
Subjective:  CC: Cindy Huerta is a 75 y.o. female  Hospital stay day 2, POD #4 left mastectomy, POD#3 wound exploration, hematoma evacuation  HPI: No acute issues overnight. Slightly more pain in axilla.  JP with serosanguinous fluid in tubing.  ROS:  General: Denies weight loss, weight gain, fatigue, fevers, chills, and night sweats. Heart: Denies chest pain, palpitations, racing heart, irregular heartbeat, leg pain or swelling, and decreased activity tolerance. Respiratory: Denies breathing difficulty, shortness of breath, wheezing, cough, and sputum. GI: Denies change in appetite, heartburn, nausea, vomiting, constipation, diarrhea, and blood in stool. GU: Denies difficulty urinating, pain with urinating, urgency, frequency, blood in urine.   Objective:   Temp:  [97.6 F (36.4 C)-98.5 F (36.9 C)] 97.6 F (36.4 C) (12/20 0806) Pulse Rate:  [67-98] 70 (12/20 1122) Resp:  [16-18] 16 (12/20 0806) BP: (155-198)/(67-87) 170/67 (12/20 1122) SpO2:  [97 %-98 %] 98 % (12/20 0806)     Height: 5\' 6"  (167.6 cm) Weight: 77.1 kg BMI (Calculated): 27.45   Intake/Output this shift:   Intake/Output Summary (Last 24 hours) at 11/24/2021 1239 Last data filed at 11/24/2021 1100 Gross per 24 hour  Intake 527 ml  Output 711 ml  Net -184 ml    Constitutional :  alert, cooperative, appears stated age, and no distress  Respiratory:  clear to auscultation bilaterally  Cardiovascular:  regular rate and rhythm  Gastrointestinal: soft, non-tender; bowel sounds normal; no masses,  no organomegaly.   Skin: Cool and moist. Mastectomy site soft, with some bruising noted, ACE wrap intact.  JP with serosanguinous fluid, total output 90 from past 24hrs.  Psychiatric: Normal affect, non-agitated, not confused       LABS:  CMP Latest Ref Rng & Units 11/24/2021 11/23/2021 11/22/2021  Glucose 70 - 99 mg/dL 95 87 135(H)  BUN 8 - 23 mg/dL 19 25(H) 31(H)  Creatinine 0.44 - 1.00 mg/dL 0.99 1.04(H) 1.69(H)   Sodium 135 - 145 mmol/L 136 135 131(L)  Potassium 3.5 - 5.1 mmol/L 4.1 4.3 5.1  Chloride 98 - 111 mmol/L 107 109 101  CO2 22 - 32 mmol/L 25 25 24   Calcium 8.9 - 10.3 mg/dL 8.7(L) 8.4(L) 8.5(L)  Total Protein 6.5 - 8.1 g/dL - - -  Total Bilirubin 0.3 - 1.2 mg/dL - - -  Alkaline Phos 38 - 126 U/L - - -  AST 15 - 41 U/L - - -  ALT 0 - 44 U/L - - -   CBC Latest Ref Rng & Units 11/24/2021 11/23/2021 11/23/2021  WBC 4.0 - 10.5 K/uL 10.5 - 11.6(H)  Hemoglobin 12.0 - 15.0 g/dL 8.4(L) 9.1(L) 7.2(L)  Hematocrit 36.0 - 46.0 % 25.2(L) 26.7(L) 21.7(L)  Platelets 150 - 400 K/uL 188 - 161    RADS: N/a Assessment:   S/p left mastectomy, subsequent hematoma evacuation.  JP output rate has continued to slow this am, with minimal output after stripping at bedside, total of about 56ml in bulb at time of exam this am. Remains serosanguinous.    Hgb dropped again slightly.  Will continue to monitor.  Up with PT now.  Right axilla with increasing brusing and TTP but still soft, not increasing in fullness.  Once JP noted to only have serosanguinous drainage less than 100 for two consecutive days and also a stabilizing hgb, could consider d/c.   Cr has normalized

## 2021-11-24 NOTE — Evaluation (Signed)
Physical Therapy Evaluation Patient Details Name: Cindy Huerta MRN: [id number] DOB: 1946-06-27 Today's Date: 11/24/2021  History of Present Illness  admitted for acute hospitalization s/p L mastectomy (12/16) with wound re-exploration, hematoma evacuation (12/17)  Clinical Impression  Patient resting in bed with son at bedside upon arrival to room (left room during session); patient alert, oriented and follows commands.  Agreeable to participation with session; reports OOB to [facility] earlier in the day.  Denies pain to surgical site; JP drain intact with minimal amounts of sanguinous drainage noted.  Bilat UE/LE strength and ROM grossly symmetrical and WFL; no focal weakness appreciated. Able to complete bed mobility with cga/close sup; sit/stand and standing balance with RW, min assist.     Orthostatic vitals assessessed with 40-50 point drop from sitting to standing position; attempted to reassess at 3-min post standing, but patient with onset of dizziness/lightheadedness, requiring immediate return to sitting and supine.  Noted with fluctuating levels of alertness and acute worsening of expressive difficulty during event.  Placed in trendelenburg for BP recovery (returned to 170s/70s), mild coughing/gagging noted.  Returned to neutral position with head of bed slightly elevated.  Rapid response called during event; nurse, team at bedside for assessment.  Patient slowly recovering, generally anxious and fearful, constantly reaching for, grabbing therapist hand. Provided with support/encouragement and coached through relaxation strategies.  Patient settled, needs in reach end of session. Would benefit from skilled PT to address above deficits and promote optimal return to PLOF.; recommend transition to STR upon discharge from acute hospitalization.      Recommendations for follow up therapy are one component of a multi-disciplinary discharge planning process, led by the attending physician.   Recommendations may be updated based on patient status, additional functional criteria and insurance authorization.  Follow Up Recommendations Skilled nursing-short term rehab (<3 hours/day)    Assistance Recommended at Discharge Frequent or constant Supervision/Assistance  Functional Status Assessment Patient has had a recent decline in their functional status and demonstrates the ability to make significant improvements in function in a reasonable and predictable amount of time.  Equipment Recommendations  Rolling walker (2 wheels);BSC/3in1    Recommendations for Other Services       Precautions / Restrictions Precautions Precautions: Fall Precaution Comments: L JP drain Restrictions Weight Bearing Restrictions: No      Mobility  Bed Mobility Overal bed mobility: Needs Assistance Bed Mobility: Supine to Sit;Sit to Supine     Supine to sit: Supervision;Min guard Sit to supine: Max assist;Total assist   General bed mobility comments: close sup/cga for supine to sit (transition towards L), incresaed time/effort to complete; max/total assist for return to supine secondary to syncopal episode    Transfers Overall transfer level: Needs assistance Equipment used: Rolling walker (2 wheels) Transfers: Sit to/from Stand Sit to Stand: Min assist           General transfer comment: cuing for hand placement; cga/min assist for safety once upright    Ambulation/Gait               General Gait Details: deferred secondary to symptomatic orthostasis, syncopal episode, anxiety  Stairs            Wheelchair Mobility    Modified Rankin (Stroke Patients Only)       Balance Overall balance assessment: Needs assistance Sitting-balance support: No upper extremity supported;Feet supported Sitting balance-Leahy Scale: Good     Standing balance support: Bilateral upper extremity supported Standing balance-Leahy Scale: Fair  Pertinent Vitals/Pain Pain Assessment: No/denies pain    Home Living Family/patient expects to be discharged to:: Private residence Living Arrangements: Children Available Help at Discharge: Family;Available 24 hours/day Type of Home: House Home Access: Stairs to enter Entrance Stairs-Rails: None Entrance Stairs-Number of Steps: 1-2 Alternate Level Stairs-Number of Steps: 1/2 flight + landing + 1/2 flight Home Layout: Bed/bath upstairs;Two level Home Equipment: Conservation officer, nature (2 wheels);Cane - quad Additional Comments: son works from home, available to assist as needed    Prior Function Prior Level of Function : Independent/Modified Independent             Mobility Comments: Pt & son report pt was independent without AD prior to admisison ADLs Comments: Independent in ADL/IADL PTA     Hand Dominance   Dominant Hand: Right    Extremity/Trunk Assessment   Upper Extremity Assessment Upper Extremity Assessment: Overall WFL for tasks assessed (grossly [facility] for basic mobility tasks (within movement precautions post-mastectomy))    Lower Extremity Assessment Lower Extremity Assessment: Overall WFL for tasks assessed (grossly at least 4/5 throughout)       Communication   Communication:  (intermittent word-finding difficulties per chart)  Cognition Arousal/Alertness: Awake/alert Behavior During Therapy: WFL for tasks assessed/performed Overall Cognitive Status: Within Functional Limits for tasks assessed                                          General Comments      Exercises Other Exercises Other Exercises: Orthostatic vitals assessessed with 40-50 point drop from sitting to standing position; attempted to reassess at 3-min post standing, but patient with onset of dizziness/lightheadedness, requiring immediate return to sitting and supine.  Noted with fluctuating levels of alertness and acute worsening of expressive difficulty during event.   Placed in trendelenburg for BP recovery (returned to 170s/70s), mild coughing/gagging noted.  Returned to neutral position with head of bed slightly elevated.  Rapid response called during event; nurse, team at bedside for assessment.  Patient slowly recovering, generally anxious and fearful, constantly reaching for, grabbing therapist hand. Provided with support/encouragement and coached through relaxation strategies.  Patient settled, needs in reach end of session.   Assessment/Plan    PT Assessment Patient needs continued PT services  PT Problem List Decreased strength;Decreased activity tolerance;Decreased balance;Decreased range of motion;Decreased mobility;Decreased knowledge of use of DME;Decreased safety awareness;Decreased knowledge of precautions;Pain       PT Treatment Interventions DME instruction;Gait training;Stair training;Functional mobility training;Therapeutic activities;Therapeutic exercise;Balance training;Patient/family education    PT Goals (Current goals can be found in the Care Plan section)  Acute Rehab PT Goals Patient Stated Goal: to take a walk PT Goal Formulation: With patient Time For Goal Achievement: 12/08/21 Potential to Achieve Goals: Good    Frequency Min 2X/week   Barriers to discharge        Co-evaluation               AM-PAC PT "6 Clicks" Mobility  Outcome Measure Help needed turning from your back to your side while in a flat bed without using bedrails?: None Help needed moving from lying on your back to sitting on the side of a flat bed without using bedrails?: A Little Help needed moving to and from a bed to a chair (including a wheelchair)?: A Little Help needed standing up from a chair using your arms (e.g., wheelchair or bedside chair)?: A Little Help  needed to walk in hospital room?: Total Help needed climbing 3-5 steps with a railing? : Total 6 Click Score: 15    End of Session Equipment Utilized During Treatment: Gait  belt Activity Tolerance: Treatment limited secondary to medical complications (Comment) (syncopal episode, rapid response) Patient left: in bed;with call bell/phone within reach;with bed alarm set Nurse Communication: Mobility status PT Visit Diagnosis: Muscle weakness (generalized) (M62.81);Difficulty in walking, not elsewhere classified (R26.2)    Time: 3968-8648 PT Time Calculation (min) (ACUTE ONLY): 39 min   Charges:   PT Evaluation $PT Eval High Complexity: 1 High PT Treatments $Therapeutic Activity: 23-37 mins       Nechemia Chiappetta H. Owens Shark, PT, DPT, NCS 11/24/21, 3:16 PM [phone]

## 2021-11-25 LAB — CBC
HCT: 27 % — ABNORMAL LOW (ref 36.0–46.0)
Hemoglobin: 9 g/dL — ABNORMAL LOW (ref 12.0–15.0)
MCH: 28.7 pg (ref 26.0–34.0)
MCHC: 33.3 g/dL (ref 30.0–36.0)
MCV: 86 fL (ref 80.0–100.0)
Platelets: 221 10*3/uL (ref 150–400)
RBC: 3.14 MIL/uL — ABNORMAL LOW (ref 3.87–5.11)
RDW: 16.6 % — ABNORMAL HIGH (ref 11.5–15.5)
WBC: 12.1 10*3/uL — ABNORMAL HIGH (ref 4.0–10.5)
nRBC: 0 % (ref 0.0–0.2)

## 2021-11-25 MED ORDER — TRAMADOL HCL 50 MG PO TABS: 50.0000 mg | ORAL_TABLET | Freq: Four times a day (QID) | ORAL | 0 refills | Status: DC | PRN

## 2021-11-25 MED ORDER — ASPIRIN 81 MG PO TBEC: 81.0000 mg | DELAYED_RELEASE_TABLET | Freq: Every day | ORAL | 12 refills | Status: DC

## 2021-11-25 MED ORDER — DOCUSATE SODIUM 100 MG PO CAPS: 100.0000 mg | ORAL_CAPSULE | Freq: Two times a day (BID) | ORAL | 0 refills | Status: AC | PRN

## 2021-11-25 NOTE — Care Management Important Message (Signed)
Important Message  Patient Details  Name: Cindy Huerta MRN: [id number] Date of Birth: [birth year]   Medicare Important Message Given:  Yes     Dannette Barbara 11/25/2021, 10:52 AM

## 2021-11-25 NOTE — Progress Notes (Signed)
Cindy Huerta to be D/C'd Home per MD order.  Discussed prescriptions and follow up appointments with the patient. Prescriptions given to patient, medication list explained in detail. Pt verbalized understanding. Son here to pick pt up.  Allergies as of 11/25/2021       Reactions   Latex Rash   Lisinopril Cough   Procaine Other (See Comments)   Unsure - told by DDS not to let anyone give it to her  Confusion   Diphenhydramine Hcl Other (See Comments)   States she was told to not take - jitteriness and agitation Not sure of reaction   Clarithromycin Other (See Comments)   Confusion   Oxycodone Nausea And Vomiting, Anxiety        Medication List     TAKE these medications    acetaminophen 500 MG tablet Commonly known as: TYLENOL Take 500-1,000 mg by mouth every 6 (six) hours as needed for moderate pain.   alum & mag hydroxide-simeth 200-200-20 MG/5ML suspension Commonly known as: MAALOX/MYLANTA Take 30 mLs by mouth every 6 (six) hours as needed for indigestion or heartburn.   ARTIFICIAL TEAR SOLUTION OP Place 1 drop into both eyes daily as needed (dry eyes).   aspirin 81 MG EC tablet Take 1 tablet (81 mg total) by mouth daily. RESTART 48HRS AFTER DISCHARGE What changed: additional instructions   atorvastatin 80 MG tablet Commonly known as: LIPITOR Take 80 mg by mouth daily.   docusate sodium 100 MG capsule Commonly known as: Colace Take 1 capsule (100 mg total) by mouth 2 (two) times daily as needed for up to 10 days for mild constipation.   fluticasone 50 MCG/ACT nasal spray Commonly known as: FLONASE Place 1 spray into both nostrils daily as needed for allergies or rhinitis.   Gaviscon 80-14.2 MG Chew Generic drug: Alum Hydroxide-Mag Trisilicate Chew 1 tablet by mouth daily as needed (gas).   hydrALAZINE 25 MG tablet Commonly known as: APRESOLINE Take 25 mg by mouth 3 (three) times daily.   levothyroxine 75 MCG tablet Commonly known as: SYNTHROID Take  75 mcg by mouth daily before breakfast.   magnesium oxide 400 MG tablet Commonly known as: MAG-OX Take 400 mg by mouth daily.   meclizine 12.5 MG tablet Commonly known as: ANTIVERT Take 12.5 mg by mouth 3 (three) times daily as needed for dizziness.   metoprolol succinate 50 MG 24 hr tablet Commonly known as: TOPROL-XL Take 50 mg by mouth 2 (two) times daily.   ondansetron 4 MG disintegrating tablet Commonly known as: Zofran ODT Take 1 tablet (4 mg total) by mouth every 8 (eight) hours as needed for nausea or vomiting.   pantoprazole 40 MG tablet Commonly known as: PROTONIX Take 1 tablet (40 mg total) by mouth daily.   potassium chloride SA 20 MEQ tablet Commonly known as: KLOR-CON M Take 20 mEq by mouth 2 (two) times daily.   telmisartan 80 MG tablet Commonly known as: MICARDIS Take 80 mg by mouth daily.   Thera-M Tabs Take 1 tablet by mouth daily.   traMADol 50 MG tablet Commonly known as: Ultram Take 1 tablet (50 mg total) by mouth every 6 (six) hours as needed for up to 10 doses.   trolamine salicylate 10 % cream Commonly known as: ASPERCREME Apply 1 application topically as needed for muscle pain.   VITAMIN B-12 PO Take 1 tablet by mouth 2 (two) times daily.               Durable Medical  Equipment  (From admission, onward)           Start     Ordered   11/25/21 1323  For home use only DME Bedside commode  Once       Question:  Patient needs a bedside commode to treat with the following condition  Answer:  Physical deconditioning   11/25/21 1322   11/25/21 1323  For home use only DME Walker rolling  Once       Question Answer Comment  Walker: With [place]   Patient needs a walker to treat with the following condition Physical deconditioning      11/25/21 1322            Vitals:   11/25/21 0856 11/25/21 1311  BP: (!) 156/73 (!) 142/64  Pulse: 73 73  Resp: 18 18  Temp: 98.1 F (36.7 C)   SpO2: 100% 100%    Skin clean, dry  and intact without evidence of skin break down, no evidence of skin tears noted. IV catheter discontinued intact. Site without signs and symptoms of complications. Dressing and pressure applied. Pt denies pain at this time. No complaints noted.  An After Visit Summary was printed and given to the patient. Patient escorted via [place], and D/C home via private auto.  Rolley Sims

## 2021-11-25 NOTE — Evaluation (Signed)
Occupational Therapy Evaluation Patient Details Name: Cindy Huerta MRN: [id number] DOB: July 01, 1946 Today's Date: 11/25/2021   History of Present Illness admitted for acute hospitalization s/p L mastectomy (12/16) with wound re-exploration, hematoma evacuation (12/17)   Clinical Impression   Pt seen for OT evaluation this date in setting of acute hospitalization s/p mastectomy. Pt presents this date with some mildly decreased fxl activity tolerance and strength. Her son is present in room and reports she is usually INDEP with fxl mobility at baseline, only has a cane at home that is rarely used. On OT assessment this date, she requires: SETUP for UB ADLs, CGA For seated LB ADLs d/t more dynamic nature of task. SUPV/CGA for standing ADLs and fxl mobility with RW. She is noted to be slightly unsteady on feet and requires cues for safe sequencing of RW use. She is returned to bed end of session with all needs met and in reach. Recommend HHOT f/u and 2WW for home and community fxl mobility, at least upon initial d/c as pt is noted to still have some near-LOB during session requiring CGA from OT for righting/fall prevention.     Recommendations for follow up therapy are one component of a multi-disciplinary discharge planning process, led by the attending physician.  Recommendations may be updated based on patient status, additional functional criteria and insurance authorization.   Follow Up Recommendations  Home health OT    Assistance Recommended at Discharge Frequent or constant Supervision/Assistance  Functional Status Assessment  Patient has had a recent decline in their functional status and demonstrates the ability to make significant improvements in function in a reasonable and predictable amount of time.  Equipment Recommendations  Other (comment) (2ww)    Recommendations for Other Services       Precautions / Restrictions Precautions Precautions: Fall Precaution Comments: L JP  drain Restrictions Weight Bearing Restrictions: No      Mobility Bed Mobility Overal bed mobility: Needs Assistance Bed Mobility: Supine to Sit;Sit to Supine     Supine to sit: Supervision;HOB elevated Sit to supine: Min guard;HOB elevated   General bed mobility comments: increased assist for LEs back to bed    Transfers Overall transfer level: Needs assistance Equipment used: Rolling walker (2 wheels) Transfers: Sit to/from Stand Sit to Stand: Min guard;Supervision           General transfer comment: CGA with progress to SUPV with cues for safe sequencing of use of RW.      Balance Overall balance assessment: Needs assistance Sitting-balance support: No upper extremity supported;Feet supported Sitting balance-Leahy Scale: Good     Standing balance support: Bilateral upper extremity supported Standing balance-Leahy Scale: Fair                             ADL either performed or assessed with clinical judgement   ADL                                         General ADL Comments: SETUP for UB ADLs, CGA For seated LB ADLs d/t more dynamic nature of task. SUPV/CGA for standinh ADLs and fxl mobility with RW.     Vision Patient Visual Report: No change from baseline       Perception     Praxis      Pertinent Vitals/Pain Pain Assessment: No/denies pain  Hand Dominance Right   Extremity/Trunk Assessment Upper Extremity Assessment Upper Extremity Assessment: Overall WFL for tasks assessed (with consideration for ROM/MMT limitations to L UE s/p mastectomy)   Lower Extremity Assessment Lower Extremity Assessment: Defer to PT evaluation;Overall [facility] for tasks assessed;Generalized weakness (ROM mostly WFL, hips somewhat tight for LB dresisng tasks, but pt able to perform with increased time. Somewhat wobbly on LEs, MMT grossly 4-/5)       Communication Communication Communication:  (intermittent word-finding difficulties)    Cognition Arousal/Alertness: Awake/alert Behavior During Therapy: WFL for tasks assessed/performed Overall Cognitive Status: Within Functional Limits for tasks assessed                                       General Comments       Exercises Other Exercises Other Exercises: OT engages pt and family member (son) in ed re: role of OT in acute setting, importance of safety with fxl mobillity/fall prevention. Safe use/sequence of use of RW for fxl mobility. Pt with good understanding.   Shoulder Instructions      Home Living Family/patient expects to be discharged to:: Private residence Living Arrangements: Children (son and DIL) Available Help at Discharge: Family;Available 24 hours/day Type of Home: House Home Access: Stairs to enter [employer] of Steps: 1-2 Entrance Stairs-Rails: None Home Layout: Bed/bath upstairs;Two level Alternate Level Stairs-Number of Steps: 1/2 flight + landing + 1/2 flight Alternate Level Stairs-Rails: Left;Right           Home Equipment: Conservation officer, nature (2 wheels);Cane - quad   Additional Comments: son works from home, available to assist as needed      Prior Functioning/Environment Prior Level of Function : Independent/Modified Independent             Mobility Comments: Pt & son report pt was independent without AD prior to admisison ADLs Comments: Independent in ADL/IADL PTA        OT Problem List: Decreased strength;Decreased activity tolerance;Decreased knowledge of use of DME or AE      OT Treatment/Interventions: Self-care/ADL training;Therapeutic exercise;DME and/or AE instruction;Therapeutic activities;Patient/family education;Balance training    OT Goals(Current goals can be found in the care plan section) Acute Rehab OT Goals Patient Stated Goal: to go home OT Goal Formulation: With patient/family Time For Goal Achievement: 12/09/21 Potential to Achieve Goals: Good ADL Goals Pt Will Perform Lower  Body Dressing: with modified independence;sit to/from stand Pt Will Transfer to Toilet: with modified independence;ambulating (LRAD to/from restroom) Pt Will Perform Toileting - Clothing Manipulation and hygiene: with modified independence;sit to/from stand  OT Frequency: Min 2X/week   Barriers to D/C:            Co-evaluation              AM-PAC OT "6 Clicks" Daily Activity     Outcome Measure Help from another person eating meals?: None Help from another person taking care of personal grooming?: A Little Help from another person toileting, which includes using toliet, bedpan, or urinal?: A Little Help from another person bathing (including washing, rinsing, drying)?: A Little Help from another person to put on and taking off regular upper body clothing?: None Help from another person to put on and taking off regular lower body clothing?: A Little 6 Click Score: 20   End of Session Equipment Utilized During Treatment: Gait belt;Rolling walker (2 wheels) Nurse Communication: Mobility status;Other (comment)  Activity  Tolerance: Patient tolerated treatment well Patient left: in bed;with call bell/phone within reach;with family/visitor present  OT Visit Diagnosis: Unsteadiness on feet (R26.81);Muscle weakness (generalized) (M62.81)                Time: 7182-0990 OT Time Calculation (min): 22 min Charges:  OT General Charges $OT Visit: 1 Visit OT Evaluation $OT Eval Low Complexity: 1 Low OT Treatments $Self Care/Home Management : 8-22 mins  Gerrianne Scale, MS, OTR/L ascom [phone] 11/25/21, 3:51 PM

## 2021-11-25 NOTE — Progress Notes (Signed)
Progress Note    Cindy Huerta  ZOX:[id number] DOB: [birth year]  DOA: 11/20/2021 PCP: Juluis Pitch, MD      Brief Narrative:    Medical records reviewed and are as summarized below:  Cindy Huerta is a 75 y.o. female with past medical history of hypertension, hyperlipidemia, TIA, vertigo/dizziness, GERD, hypothyroidism, anxiety, left breast cancer, CKD-3A, GI bleeding, anemia, who is admitted by Dr. Lysle Pearl of general surgery for left mastectomy.  The hospitalist team was consulted because of elevated blood pressure and dizziness. Patient said she has a history of vertigo and she usually experiences symptoms with rapid changes in position.  However, she is able to manage this at home and his son said that she is able to get around, perform activities of daily living and drive to [facility] for exercises.  Patient and his son are unaware of any history of orthostatic hypotension.     Assessment/Plan:   Principal Problem:   Breast cancer ([place]) Active Problems:   HLD (hyperlipidemia)   Hypothyroidism   CKD (chronic kidney disease), stage IIIa   GERD (gastroesophageal reflux disease)   History of TIA (transient ischemic attack)   Breast CA (HCC)   HTN (hypertension)   Normocytic anemia   Vertigo    Body mass index is 27.43 kg/m.  Vertigo, orthostatic hypotension: On 11/24/2021, lying BP was 170/65, sitting BP 166/82 and standing BP 121/75.  It is unknown whether patient has a history of orthostatic hypotension which could be causing her chronic vertigo.  Patient and her son said she is able to manage this at home so [place] as she avoids rapid changes in position.  Repeat orthostatic vital signs.  Patient and her son were educated about orthostatic hypotension and steps to reduce symptoms.  Hypertension: Continue antihypertensives  Breast cancer s/p left mastectomy: Follow-up with general surgeon.  Other comorbidities include hyperlipidemia, normocytic anemia,  hypothyroidism, CKD stage III, history of TIA, GERD.   Diet Order             Diet regular Room service appropriate? Yes; Fluid consistency: Thin  Diet effective now                      Consultants: General surgeon  Procedures: Left mastectomy    Medications:    atorvastatin  80 mg Oral Daily   gabapentin  300 mg Oral BID   hydrALAZINE  100 mg Oral TID   irbesartan  300 mg Oral Daily   levothyroxine  75 mcg Oral QAC breakfast   magnesium oxide  400 mg Oral Daily   metoprolol succinate  50 mg Oral BID   multivitamin with minerals  1 tablet Oral Daily   pantoprazole  40 mg Oral Daily   vitamin B-12  50 mcg Oral BID   Continuous Infusions:   Anti-infectives (From admission, onward)    Start     Dose/Rate Route Frequency Ordered Stop   11/21/21 1030  ceFAZolin (ANCEF) IVPB 2g/100 mL premix  Status:  Discontinued        2 g 200 mL/hr over 30 Minutes Intravenous  Once 11/21/21 0933 11/22/21 1120   11/20/21 1132  ceFAZolin (ANCEF) 2-4 GM/100ML-% IVPB       Note to Pharmacy: Shary Key C: cabinet override      11/20/21 1132 11/20/21 1227   11/20/21 0600  ceFAZolin (ANCEF) IVPB 2g/100 mL premix        2 g 200 mL/hr over 30 Minutes  Intravenous On call to O.R. 11/20/21 0228 11/20/21 1215              Family Communication/Anticipated D/C date and plan/Code Status   DVT prophylaxis: SCD's Start: 11/20/21 1805     Code Status: DNR  Family Communication: Plan discussed with her son at the bedside Disposition Plan: To be determined by his general surgeon           Subjective:   C/o soreness at the left breast surgical site.  Her son was at the bedside  Objective:    Vitals:   11/24/21 2113 11/25/21 0558 11/25/21 0856 11/25/21 1311  BP: (!) 154/84 (!) 160/70 (!) 156/73 (!) 142/64  Pulse: 89 79 73 73  Resp:  16 18 18   Temp: 99.4 F (37.4 C) 98.1 F (36.7 C) 98.1 F (36.7 C)   TempSrc: Oral  Oral   SpO2: 100% 99% 100% 100%  Weight:       Height:       No data found.   Intake/Output Summary (Last 24 hours) at 11/25/2021 1517 Last data filed at 11/25/2021 1444 Gross per 24 hour  Intake 480 ml  Output 462 ml  Net 18 ml   Filed Weights   11/20/21 1124  Weight: 77.1 kg    Exam:  GEN: NAD SKIN: Warm and dry.  No bleeding at left breast surgical site EYES: EOMI ENT: MMM CV: RRR PULM: CTA B ABD: soft, ND, NT, +BS CNS: AAO x 3, non focal EXT: No edema or tenderness        Data Reviewed:   I have personally reviewed following labs and imaging studies:  Labs: Labs show the following:   Basic Metabolic Panel: Recent Labs  Lab 11/21/21 0432 11/22/21 0521 11/23/21 0704 11/24/21 0450  NA 133* 131* 135 136  K 4.4 5.1 4.3 4.1  CL 100 101 109 107  CO2 25 24 25 25   GLUCOSE 134* 135* 87 95  BUN 22 31* 25* 19  CREATININE 1.66* 1.69* 1.04* 0.99  CALCIUM 8.9 8.5* 8.4* 8.7*   GFR Estimated Creatinine Clearance: 49.9 mL/min (by C-G formula based on SCr of 0.99 mg/dL). Liver Function Tests: No results for input(s): AST, ALT, ALKPHOS, BILITOT, PROT, ALBUMIN in the last 168 hours. No results for input(s): LIPASE, AMYLASE in the last 168 hours. No results for input(s): AMMONIA in the last 168 hours. Coagulation profile Recent Labs  Lab 11/21/21 2214 11/24/21 0450  INR 1.3* 1.2    CBC: Recent Labs  Lab 11/21/21 2214 11/22/21 0521 11/23/21 0704 11/23/21 1848 11/24/21 0450 11/25/21 0419  WBC 15.0* 15.2* 11.6*  --  10.5 12.1*  NEUTROABS  --   --  7.9*  --   --   --   HGB 8.0* 8.2* 7.2* 9.1* 8.4* 9.0*  HCT 23.6* 24.4* 21.7* 26.7* 25.2* 27.0*  MCV 87.7 86.8 88.9  --  86.6 86.0  PLT 182 173 161  --  188 221   Cardiac Enzymes: No results for input(s): CKTOTAL, CKMB, CKMBINDEX, TROPONINI in the last 168 hours. BNP (last 3 results) No results for input(s): PROBNP in the last 8760 hours. CBG: Recent Labs  Lab 11/20/21 1745  GLUCAP 160*   D-Dimer: No results for input(s): DDIMER in the  last 72 hours. Hgb A1c: No results for input(s): HGBA1C in the last 72 hours. Lipid Profile: No results for input(s): CHOL, HDL, LDLCALC, TRIG, CHOLHDL, LDLDIRECT in the last 72 hours. Thyroid function studies: No results for input(s): TSH,  T4TOTAL, T3FREE, THYROIDAB in the last 72 hours.  Invalid input(s): FREET3 Anemia work up: No results for input(s): VITAMINB12, FOLATE, FERRITIN, TIBC, IRON, RETICCTPCT in the last 72 hours. Sepsis Labs: Recent Labs  Lab 11/22/21 0521 11/23/21 0704 11/24/21 0450 11/25/21 0419  WBC 15.2* 11.6* 10.5 12.1*    Microbiology Recent Results (from the past 240 hour(s))  Resp Panel by RT-PCR (Flu A&B, Covid) Nasopharyngeal Swab     Status: None   Collection Time: 11/20/21  4:12 PM   Specimen: Nasopharyngeal Swab; Nasopharyngeal(NP) swabs in vial transport medium  Result Value Ref Range Status   SARS Coronavirus 2 by RT PCR NEGATIVE NEGATIVE Final    Comment: (NOTE) SARS-CoV-2 target nucleic acids are NOT DETECTED.  The SARS-CoV-2 RNA is generally detectable in upper respiratory specimens during the acute phase of infection. The lowest concentration of SARS-CoV-2 viral copies this assay can detect is 138 copies/mL. A negative result does not preclude SARS-Cov-2 infection and should not be used as the sole basis for treatment or other patient management decisions. A negative result may occur with  improper specimen collection/handling, submission of specimen other than nasopharyngeal swab, presence of viral mutation(s) within the areas targeted by this assay, and inadequate number of viral copies(<138 copies/mL). A negative result must be combined with clinical observations, patient history, and epidemiological information. The expected result is Negative.  Fact Sheet for Patients:  EntrepreneurPulse.com.au  Fact Sheet for Healthcare Providers:  IncredibleEmployment.be  This test is no t yet approved or  cleared by the [place] FDA and  has been authorized for detection and/or diagnosis of SARS-CoV-2 by FDA under an Emergency Use Authorization (EUA). This EUA will remain  in effect (meaning this test can be used) for the duration of the COVID-19 declaration under Section 564(b)(1) of the Act, 21 U.S.C.section 360bbb-3(b)(1), unless the authorization is terminated  or revoked sooner.       Influenza A by PCR NEGATIVE NEGATIVE Final   Influenza B by PCR NEGATIVE NEGATIVE Final    Comment: (NOTE) The Xpert Xpress SARS-CoV-2/FLU/RSV plus assay is intended as an aid in the diagnosis of influenza from Nasopharyngeal swab specimens and should not be used as a sole basis for treatment. Nasal washings and aspirates are unacceptable for Xpert Xpress SARS-CoV-2/FLU/RSV testing.  Fact Sheet for Patients: EntrepreneurPulse.com.au  Fact Sheet for Healthcare Providers: IncredibleEmployment.be  This test is not yet approved or cleared by the [place] FDA and has been authorized for detection and/or diagnosis of SARS-CoV-2 by FDA under an Emergency Use Authorization (EUA). This EUA will remain in effect (meaning this test can be used) for the duration of the COVID-19 declaration under Section 564(b)(1) of the Act, 21 U.S.C. section 360bbb-3(b)(1), unless the authorization is terminated or revoked.  Performed at [facility], [address] [place], [place] [address]     Procedures and diagnostic studies:  No results found.             LOS: 3 days   Victorino Fatzinger  [facility]   Pager on [email or web address]. If 7PM-7AM, please contact night-coverage at [email or web address]     11/25/2021, 3:17 PM

## 2021-11-25 NOTE — Discharge Instructions (Signed)
BREAST SURGERY, Care After This sheet gives you information about how to care for yourself after your procedure. Your doctor may also give you more specific instructions. If you have problems or questions, contact your doctor. Follow these instructions at home: Care for cuts from surgery (incisions)    Follow instructions from your doctor about how to take care of your cuts from surgery. Make sure you: Wash your hands with soap and water before you change your bandage (dressing). If you cannot use soap and water, use hand sanitizer. APPLY GAUZE OVER STAPLE LINE AND DRAIN SITE AND WRAP ENTIRE AREA TIGHTLY WITH ACE WRAP TO MINIMIZE SWELLING. KEEP DRAIN OFF SUCTION .  EMPTY AS NEEDED AND RECORD OUTPUT Leave stitches (sutures), skin glue, or skin tape (adhesive) strips in place. They may need to stay in place for 2 weeks or longer. If tape strips get loose and curl up, you may trim the loose edges. Do not remove tape strips completely unless your doctor says it is okay. Do not take baths, swim, or use a hot tub until your doctor says it is okay. OK TO SHOWER IN 24HRS  Check your surgical cut area every day for signs of infection. Check for: More redness, swelling, or pain. More fluid or blood. Warmth. Pus or a bad smell. Activity Do not drive or use heavy machinery while taking prescription pain medicine. Do not play contact sports until your doctor says it is okay. Do not drive for 24 hours if you were given a medicine to help you relax (sedative). Rest as needed. Do not return to work or school until your doctor says it is okay. Wear supportive bras as tolerated to minimize discomfort General instructions  tylenol as needed for discomfort.   Use narcotics, if prescribed, only when tylenol and motrin is not enough to control pain.  325-650mg  every 8hrs to max of 4000mg /24hrs for the tylenol.    To prevent or treat constipation while you are taking prescription pain medicine, your doctor may  recommend that you: Drink enough fluid to keep your pee (urine) clear or pale yellow. Take over-the-counter or prescription medicines. Eat foods that are high in fiber, such as fresh fruits and vegetables, whole grains, and beans. Limit foods that are high in fat and processed sugars, such as fried and sweet foods. Contact a doctor if: You develop a rash. You have more redness, swelling, or pain around your surgical cuts. You have more fluid or blood coming from your surgical cuts. Your surgical cuts feel warm to the touch. You have pus or a bad smell coming from your surgical cuts. You have a fever. One or more of your surgical cuts breaks open. You have trouble breathing. You have chest pain. You have pain that is getting worse in your shoulders. You faint or feel dizzy when you stand. You have very bad pain in your belly (abdomen). You are sick to your stomach (nauseous) for more than one day. You have throwing up (vomiting) that lasts for more than one day. You have leg pain. This information is not intended to replace advice given to you by your health care provider. Make sure you discuss any questions you have with your health care provider.

## 2021-11-25 NOTE — TOC Initial Note (Signed)
Transition of Care [facility]) - Initial/Assessment Note    Patient Details  Name: Cindy Huerta MRN: [id number] Date of Birth: [birth year]  Transition of Care [facility]) CM/SW Contact:    Eileen Stanford, LCSW Phone Number: 11/25/2021, 1:18 PM  Clinical Narrative:   CSW spoke with to pt reguarding SNF recommendation. Pt states she would rather go home with home health. Pt lives with son [name], and his wife. Pt didn't have a preference for [facility] agency. Pt does state she would like a walker and bedside commode.   Pt states her son [name] takes her to her doctor appointments. Pt states she still sees Dr Lovie Macadamia for primary Care. [facility] [place] [facility].             Expected Discharge Plan: [place] Barriers to Discharge: Continued Medical Work up   Patient Goals and CMS Choice Patient states their goals for this hospitalization and ongoing recovery are:: to go home   Choice offered to / list presented to : Patient  Expected Discharge Plan and Services Expected Discharge Plan: [place] In-house Referral: Clinical Social Work   Post Acute Care Choice: [place] arrangements for the past 2 months: [place] Expected Discharge Date: 11/25/21               DME Arranged: Gilford Rile, Bedside commode DME Agency: [employer] Date DME Agency Contacted: 11/25/21 Time DME Agency Contacted: [phone] Representative spoke with at DME Agency: Suanne Marker HH Arranged: PT, OT          Prior Living Arrangements/Services Living arrangements for the past 2 months: Single Family Home Lives with:: Adult Children Patient language and need for interpreter reviewed:: Yes Do you feel safe going back to the place where you live?: Yes      Need for Family Participation in Patient Care: Yes (Comment) Care giver support system in place?: Yes (comment)   Criminal Activity/Legal Involvement Pertinent to Current Situation/Hospitalization: No -  Comment as needed  Activities of Daily Living      Permission Sought/Granted Permission sought to share information with : Family Supports Permission granted to share information with : Yes, Verbal Permission Granted  Share Information with NAME: [name]     Permission granted to share info w Relationship: son     Emotional Assessment Appearance:: Appears stated age Attitude/Demeanor/Rapport: Engaged Affect (typically observed): Accepting Orientation: : Oriented to Situation, Oriented to  Time, Oriented to Place, Oriented to Self Alcohol / Substance Use: Not Applicable Psych Involvement: No (comment)  Admission diagnosis:  Breast cancer ([place]) [C50.919] Breast CA ([place]) [C50.919] Patient Active Problem List   Diagnosis Date Noted   HTN (hypertension) 11/24/2021   Normocytic anemia 11/24/2021   Vertigo 11/24/2021   Breast CA ([place]) 11/22/2021   Breast cancer ([place]) 11/20/2021   Intractable nausea and vomiting 10/09/2021   Weakness 10/07/2021   Carcinoma of overlapping sites of left breast in female, estrogen receptor positive ([place]) 10/06/2021   Dizziness 10/06/2021   Rectal bleeding 10/06/2021   Nausea & vomiting 10/06/2021   GIB (gastrointestinal bleeding) 10/06/2021   Left-sided weakness 07/29/2021   HLD (hyperlipidemia) 07/29/2021   Hypothyroidism 07/29/2021   Left sided numbness 07/29/2021   Depression    CKD (chronic kidney disease), stage IIIa    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   Epigastric pain 04/23/2021   GERD (gastroesophageal reflux disease) 04/23/2021   Benign essential hypertension 01/20/2021   History of breast cancer  01/20/2021   History of TIA (transient ischemic attack) 01/20/2021   Palpitations 01/20/2021   Malignant neoplasm of upper-outer quadrant of left female breast ([place]) 12/06/2018   PCP:  Juluis Pitch, MD Pharmacy:   [id number] [place], [address] [place] [address] Phone: [phone] Fax:  [phone]     Social Determinants of Health (SDOH) Interventions    Readmission Risk Interventions Readmission Risk Prevention Plan 11/25/2021 10/10/2021 10/09/2021  Transportation Screening Complete Complete Complete  PCP or Specialist Appt within 5-7 Days - Complete -  PCP or Specialist Appt within 3-5 Days Complete - -  Home Care Screening - Complete -  Medication Review (RN CM) - Complete Complete  HRI or Home Care Consult Complete - -  Social Work Consult for [place] Planning/Counseling Complete - -  Palliative Care Screening Not Applicable - -  Medication Review (RN Care Manager) Complete - -  Some recent data might be hidden

## 2021-11-26 ENCOUNTER — Other Ambulatory Visit: Payer: Self-pay | Admitting: Anatomic Pathology & Clinical Pathology

## 2021-11-26 LAB — SURGICAL PATHOLOGY

## 2021-11-26 NOTE — Discharge Summary (Signed)
Physician Discharge Summary  Patient ID: Cindy Huerta MRN: [id number] DOB/AGE: [birth year] 75 y.o.  Admit date: 11/20/2021 Discharge date: 11/25/21  Admission Diagnoses: breast CA  Discharge Diagnoses:  Same as above, plus post op hematoma and bleeding  Discharged Condition: good  Hospital Course: admitted for above. Underwent mastectomy.  Please see op note for details.  POD#1, noticed to have frank venous bleeding in JP.  Continuous after several times emptying at bedside, so taken back for second look, hematoma evacuation.  See op note for details. Post op, seemed to have intermittent bleeding, eventually requiring 1 unite pRBC.  After additional modalities such as removing all negative suction including on JP, pt hemoglobin stabilized and output slowed to less than 69ml of serosanguinous discharge.  She did develop some visible bruising and swelling along her left axilla area, at superior edge of ACE wrap, but remaining staple line was soft, no bruising and pain minimal.  PT/OT recommended short term rehab, but pt and son requested home PT since she will have adequate supervision at home.  This was all arranged prior to d/c.  At time of d/c, tolerating diet and pain controlled as wel on oral meds only.  Of note, hospitalist was consulted to aid in HTN management with her home meds due to multimodality tx for her chronic hypertension and orthostatic hypotension. No additonal recs given at time of d/c.  Consults:  IM  Discharge Exam: Blood pressure (!) 142/64, pulse 73, temperature 98.1 F (36.7 C), temperature source Oral, resp. rate 18, height 5\' 6"  (1.676 m), weight 77.1 kg, SpO2 100 %. General appearance: alert, cooperative, and no distress Skin: ACE wrap in place, with JP with scant serosanguinous discharge.  Staple like C/D/I.  some visible bruising and swelling along her left axilla area, at superior edge of ACE wrap, but remaining staple line was soft, no bruising and pain  minimal  Disposition:  Discharge disposition: 01-Home or Self Care        Allergies as of 11/25/2021       Reactions   Latex Rash   Lisinopril Cough   Procaine Other (See Comments)   Unsure - told by DDS not to let anyone give it to her  Confusion   Diphenhydramine Hcl Other (See Comments)   States she was told to not take - jitteriness and agitation Not sure of reaction   Clarithromycin Other (See Comments)   Confusion   Oxycodone Nausea And Vomiting, Anxiety        Medication List     TAKE these medications    acetaminophen 500 MG tablet Commonly known as: TYLENOL Take 500-1,000 mg by mouth every 6 (six) hours as needed for moderate pain.   alum & mag hydroxide-simeth 200-200-20 MG/5ML suspension Commonly known as: MAALOX/MYLANTA Take 30 mLs by mouth every 6 (six) hours as needed for indigestion or heartburn.   ARTIFICIAL TEAR SOLUTION OP Place 1 drop into both eyes daily as needed (dry eyes).   aspirin 81 MG EC tablet Take 1 tablet (81 mg total) by mouth daily. RESTART 48HRS AFTER DISCHARGE What changed: additional instructions   atorvastatin 80 MG tablet Commonly known as: LIPITOR Take 80 mg by mouth daily.   docusate sodium 100 MG capsule Commonly known as: Colace Take 1 capsule (100 mg total) by mouth 2 (two) times daily as needed for up to 10 days for mild constipation.   fluticasone 50 MCG/ACT nasal spray Commonly known as: FLONASE Place 1 spray into both nostrils daily  as needed for allergies or rhinitis.   Gaviscon 80-14.2 MG Chew Generic drug: Alum Hydroxide-Mag Trisilicate Chew 1 tablet by mouth daily as needed (gas).   hydrALAZINE 25 MG tablet Commonly known as: APRESOLINE Take 25 mg by mouth 3 (three) times daily.   levothyroxine 75 MCG tablet Commonly known as: SYNTHROID Take 75 mcg by mouth daily before breakfast.   magnesium oxide 400 MG tablet Commonly known as: MAG-OX Take 400 mg by mouth daily.   meclizine 12.5 MG  tablet Commonly known as: ANTIVERT Take 12.5 mg by mouth 3 (three) times daily as needed for dizziness.   metoprolol succinate 50 MG 24 hr tablet Commonly known as: TOPROL-XL Take 50 mg by mouth 2 (two) times daily.   ondansetron 4 MG disintegrating tablet Commonly known as: Zofran ODT Take 1 tablet (4 mg total) by mouth every 8 (eight) hours as needed for nausea or vomiting.   pantoprazole 40 MG tablet Commonly known as: PROTONIX Take 1 tablet (40 mg total) by mouth daily.   potassium chloride SA 20 MEQ tablet Commonly known as: KLOR-CON M Take 20 mEq by mouth 2 (two) times daily.   telmisartan 80 MG tablet Commonly known as: MICARDIS Take 80 mg by mouth daily.   Thera-M Tabs Take 1 tablet by mouth daily.   traMADol 50 MG tablet Commonly known as: Ultram Take 1 tablet (50 mg total) by mouth every 6 (six) hours as needed for up to 10 doses.   trolamine salicylate 10 % cream Commonly known as: ASPERCREME Apply 1 application topically as needed for muscle pain.   VITAMIN B-12 PO Take 1 tablet by mouth 2 (two) times daily.        Follow-up Information     Benjamine Sprague, DO Follow up on 12/01/2021.   Specialty: Surgery Why: post op mastectomy check. Go at 10:15am. Contact information: [address] [place] [address] [phone]                  Total time spent arranging discharge was >58min. Signed: Benjamine Sprague 11/26/2021, 10:22 AM

## 2021-12-01 ENCOUNTER — Other Ambulatory Visit: Payer: Self-pay

## 2021-12-01 ENCOUNTER — Emergency Department
Admission: EM | Admit: 2021-12-01 | Discharge: 2021-12-01 | Disposition: A | Discharge status: Home / Self Care | Payer: Medicare PPO | Attending: Emergency Medicine | Admitting: Emergency Medicine

## 2021-12-01 ENCOUNTER — Emergency Department: Payer: Medicare PPO

## 2021-12-01 DIAGNOSIS — N1831 Chronic kidney disease, stage 3a: Secondary | ICD-10-CM | POA: Insufficient documentation

## 2021-12-01 DIAGNOSIS — F419 Anxiety disorder, unspecified: Secondary | ICD-10-CM | POA: Diagnosis not present

## 2021-12-01 DIAGNOSIS — Z7982 Long term (current) use of aspirin: Secondary | ICD-10-CM | POA: Diagnosis not present

## 2021-12-01 DIAGNOSIS — Z91040 Latex allergy status: Secondary | ICD-10-CM | POA: Diagnosis not present

## 2021-12-01 DIAGNOSIS — R0600 Dyspnea, unspecified: Secondary | ICD-10-CM

## 2021-12-01 DIAGNOSIS — M549 Dorsalgia, unspecified: Secondary | ICD-10-CM | POA: Insufficient documentation

## 2021-12-01 DIAGNOSIS — R0602 Shortness of breath: Secondary | ICD-10-CM | POA: Insufficient documentation

## 2021-12-01 DIAGNOSIS — E039 Hypothyroidism, unspecified: Secondary | ICD-10-CM | POA: Diagnosis not present

## 2021-12-01 DIAGNOSIS — I129 Hypertensive chronic kidney disease with stage 1 through stage 4 chronic kidney disease, or unspecified chronic kidney disease: Secondary | ICD-10-CM | POA: Insufficient documentation

## 2021-12-01 DIAGNOSIS — Z853 Personal history of malignant neoplasm of breast: Secondary | ICD-10-CM | POA: Insufficient documentation

## 2021-12-01 DIAGNOSIS — R072 Precordial pain: Secondary | ICD-10-CM

## 2021-12-01 DIAGNOSIS — Z79899 Other long term (current) drug therapy: Secondary | ICD-10-CM | POA: Diagnosis not present

## 2021-12-01 LAB — CBC
HCT: 31.7 % — ABNORMAL LOW (ref 36.0–46.0)
Hemoglobin: 10.5 g/dL — ABNORMAL LOW (ref 12.0–15.0)
MCH: 28.9 pg (ref 26.0–34.0)
MCHC: 33.1 g/dL (ref 30.0–36.0)
MCV: 87.3 fL (ref 80.0–100.0)
Platelets: 279 10*3/uL (ref 150–400)
RBC: 3.63 MIL/uL — ABNORMAL LOW (ref 3.87–5.11)
RDW: 15.5 % (ref 11.5–15.5)
WBC: 13.6 10*3/uL — ABNORMAL HIGH (ref 4.0–10.5)
nRBC: 0 % (ref 0.0–0.2)

## 2021-12-01 LAB — BASIC METABOLIC PANEL
Anion gap: 11 (ref 5–15)
BUN: 7 mg/dL — ABNORMAL LOW (ref 8–23)
CO2: 24 mmol/L (ref 22–32)
Calcium: 9.1 mg/dL (ref 8.9–10.3)
Chloride: 103 mmol/L (ref 98–111)
Creatinine, Ser: 1 mg/dL (ref 0.44–1.00)
GFR, Estimated: 58 mL/min — ABNORMAL LOW (ref >60)
Glucose, Bld: 103 mg/dL — ABNORMAL HIGH (ref 70–99)
Potassium: 3.6 mmol/L (ref 3.5–5.1)
Sodium: 138 mmol/L (ref 135–145)

## 2021-12-01 LAB — TROPONIN I (HIGH SENSITIVITY)
Troponin I (High Sensitivity): 9 ng/L (ref <18)
Troponin I (High Sensitivity): 15 ng/L (ref <18)

## 2021-12-01 MED ORDER — IOHEXOL 350 MG/ML SOLN
75.0000 mL | Freq: Once | INTRAVENOUS | Status: AC | PRN
  Administered 2021-12-01: 16:00:00 75 mL via INTRAVENOUS

## 2021-12-01 MED ORDER — LORAZEPAM 2 MG/ML IJ SOLN
1.0000 mg | Freq: Once | INTRAMUSCULAR | Status: AC
  Administered 2021-12-01: 13:00:00 1 mg via INTRAVENOUS
  Filled 2021-12-01: qty 1

## 2021-12-01 MED ORDER — ONDANSETRON HCL 4 MG/2ML IJ SOLN
4.0000 mg | Freq: Once | INTRAMUSCULAR | Status: AC
  Administered 2021-12-01: 13:00:00 4 mg via INTRAVENOUS
  Filled 2021-12-01: qty 2

## 2021-12-01 MED ORDER — TRAMADOL HCL 50 MG PO TABS: 50.0000 mg | ORAL_TABLET | Freq: Four times a day (QID) | ORAL | 0 refills | Status: DC | PRN

## 2021-12-01 MED ORDER — MORPHINE SULFATE (PF) 4 MG/ML IV SOLN
4.0000 mg | Freq: Once | INTRAVENOUS | Status: AC
  Administered 2021-12-01: 13:00:00 4 mg via INTRAVENOUS
  Filled 2021-12-01: qty 1

## 2021-12-01 NOTE — ED Provider Notes (Signed)
Patient is a 75 year old female here with acute onset of right-sided chest and back pain.  Patient is status post recent mastectomy.  Clinically, suspect musculoskeletal pain/spasm in the setting of recent increase in her physical activity after mastectomy.  She just had a physical therapist come to the house and has been exercising.  CT angio obtained by Dr. Rip Harbour reviewed by me, shows no evidence of PE or other significant abnormality.  She does have some mild atelectasis which is consistent with her pain and muscular spasm.  Troponin x2 negative.  EKG nonischemic.  Do not suspect ACS.  Lab work otherwise reassuring.  Patient feels markedly improved with analgesia in the ED and is able to move without difficulty.  Will have her schedule her analgesics at home, advise regular gentle stretching and continued exercise, as well as deep inspiration for her atelectasis.  Return precautions given.   Duffy Bruce, MD 12/01/21 [phone]

## 2021-12-01 NOTE — ED Provider Notes (Signed)
[facility] Emergency Department Provider Note  _________________________   Event Date/Time   First MD Initiated Contact with Patient 12/01/21 1513     (approximate)  I have reviewed the triage vital signs and the nursing notes.   HISTORY  Chief Complaint Shortness of Breath   HPI Cindy Huerta is a 75 y.o. female who comes in with her husband.  She recently had a mastectomy.  She was admitted on the 16th for this.  She has been doing well for the last couple days at home but today getting out of the toilet she developed a pain in her chest and back and could not get up to stand straight up.  She became short of breath.  She became very anxious.  Husband reports at home she was speaking normally but here she is speaking very haltingly having word finding difficulty.  Patient's husband reports to me that this is normally how she acts when she gets anxious.  Patient's nurse Claiborne Billings tells me that patient was able to talk normally with her but when she got anxious she became exactly like I described.  I spent quite some time in the room with the patient but could not get the patient to relax and speak normally to me.  Patient yelled in pain when I touch her upper chest well above the mastectomy site site lightly with my fingers.  Patient reports the pain is in her upper back as well.  Seems to be worse with movement and deep breathing.  She also feels short of breath and is breathing hard.  Her sats are doing well though.         Past Medical History:  Diagnosis Date   Arthritis    Breast cancer ([place])    Depression    GERD (gastroesophageal reflux disease)    HLD (hyperlipidemia)    Hypertension    Hypothyroidism     Patient Active Problem List   Diagnosis Date Noted   HTN (hypertension) 11/24/2021   Normocytic anemia 11/24/2021   Vertigo 11/24/2021   Breast CA ([place]) 11/22/2021   Breast cancer ([place]) 11/20/2021   Intractable nausea and vomiting  10/09/2021   Weakness 10/07/2021   Carcinoma of overlapping sites of left breast in female, estrogen receptor positive ([place]) 10/06/2021   Dizziness 10/06/2021   Rectal bleeding 10/06/2021   Nausea & vomiting 10/06/2021   GIB (gastrointestinal bleeding) 10/06/2021   Left-sided weakness 07/29/2021   HLD (hyperlipidemia) 07/29/2021   Hypothyroidism 07/29/2021   Left sided numbness 07/29/2021   Depression    CKD (chronic kidney disease), stage IIIa    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   Epigastric pain 04/23/2021   GERD (gastroesophageal reflux disease) 04/23/2021   Benign essential hypertension 01/20/2021   History of breast cancer 01/20/2021   History of TIA (transient ischemic attack) 01/20/2021   Palpitations 01/20/2021   Malignant neoplasm of upper-outer quadrant of left female breast ([place]) 12/06/2018    Past Surgical History:  Procedure Laterality Date   ABDOMINAL HYSTERECTOMY  1982   AXILLARY SENTINEL NODE BIOPSY Left 11/20/2021   Procedure: AXILLARY SENTINEL NODE BIOPSY;  Surgeon: Benjamine Sprague, DO;  Location: [facility] ORS;  Service: General;  Laterality: Left;   BREAST BIOPSY Left 09/23/2021   3:00 13cmfn venus marker, path pending   BREAST BIOPSY Left 09/23/2021   3:00 14 cmfn heart marker, path pending   CHOLECYSTECTOMY     ESOPHAGOGASTRODUODENOSCOPY (EGD) WITH PROPOFOL N/A 10/08/2021   Procedure: ESOPHAGOGASTRODUODENOSCOPY (EGD) WITH PROPOFOL;  Surgeon: Jonathon Bellows, MD;  Location: [facility] ENDOSCOPY;  Service: Gastroenterology;  Laterality: N/A;   HEMATOMA EVACUATION Left 11/21/2021   Procedure: EVACUATION HEMATOMA;  Surgeon: Benjamine Sprague, DO;  Location: [facility] ORS;  Service: General;  Laterality: Left;   left lumpectomy  2019   TOTAL MASTECTOMY Left 11/20/2021   Procedure: TOTAL MASTECTOMY;  Surgeon: Benjamine Sprague, DO;  Location: [facility] ORS;  Service: General;  Laterality: Left;    Prior to Admission medications   Medication Sig Start Date End Date Taking? Authorizing Provider   ARTIFICIAL TEAR SOLUTION OP Place 1 drop into both eyes daily as needed (dry eyes).   Yes [provider]  aspirin 81 MG EC tablet Take 1 tablet (81 mg total) by mouth daily. RESTART 48HRS AFTER DISCHARGE 11/25/21  Yes Sakai, Isami, DO  atorvastatin (LIPITOR) 80 MG tablet Take 80 mg by mouth daily. 09/21/21  Yes [provider]  Cyanocobalamin (VITAMIN B-12 PO) Take 1 tablet by mouth 2 (two) times daily.   Yes [provider]  FLUoxetine (PROZAC) 10 MG capsule Take 10 mg by mouth daily. 11/13/21  Yes [provider]  hydrALAZINE (APRESOLINE) 25 MG tablet Take 25 mg by mouth 3 (three) times daily. 02/04/21  Yes [provider]  levothyroxine (SYNTHROID) 75 MCG tablet Take 75 mcg by mouth daily before breakfast. 02/10/21  Yes [provider]  magnesium oxide (MAG-OX) 400 MG tablet Take 400 mg by mouth daily.   Yes [provider]  meclizine (ANTIVERT) 12.5 MG tablet Take 12.5 mg by mouth 3 (three) times daily as needed for dizziness.   Yes [provider]  metoprolol succinate (TOPROL-XL) 50 MG 24 hr tablet Take 50 mg by mouth 2 (two) times daily. 03/18/21  Yes [provider]  Multiple Vitamins-Minerals (THERA-M) TABS Take 1 tablet by mouth daily.   Yes [provider]  ondansetron (ZOFRAN ODT) 4 MG disintegrating tablet Take 1 tablet (4 mg total) by mouth every 8 (eight) hours as needed for nausea or vomiting. 10/10/21  Yes Sreenath, Sudheer B, MD  pantoprazole (PROTONIX) 40 MG tablet Take 1 tablet (40 mg total) by mouth daily. 10/10/21 12/09/21 Yes Sreenath, Sudheer B, MD  potassium chloride SA (KLOR-CON) 20 MEQ tablet Take 20 mEq by mouth 2 (two) times daily.   Yes [provider]  telmisartan (MICARDIS) 80 MG tablet Take 80 mg by mouth daily. 10/06/18  Yes [provider]  traMADol (ULTRAM) 50 MG tablet Take 1 tablet (50 mg total) by mouth every 6 (six) hours as needed for up to 10 doses. 11/25/21   Yes Sakai, Isami, DO  acetaminophen (TYLENOL) 500 MG tablet Take 500-1,000 mg by mouth every 6 (six) hours as needed for moderate pain.    [provider]  alum & mag hydroxide-simeth (MAALOX/MYLANTA) 200-200-20 MG/5ML suspension Take 30 mLs by mouth every 6 (six) hours as needed for indigestion or heartburn.    [provider]  Alum Hydroxide-Mag Trisilicate (GAVISCON) 52-84.1 MG CHEW Chew 1 tablet by mouth daily as needed (gas).    [provider]  docusate sodium (COLACE) 100 MG capsule Take 1 capsule (100 mg total) by mouth 2 (two) times daily as needed for up to 10 days for mild constipation. 11/25/21 12/05/21  Lysle Pearl, Isami, DO  fluticasone (FLONASE) 50 MCG/ACT nasal spray Place 1 spray into both nostrils daily as needed for allergies or rhinitis.    [provider]  trolamine salicylate (ASPERCREME) 10 % cream Apply 1 application topically as  needed for muscle pain.    [provider]    Allergies Latex, Lisinopril, Procaine, Diphenhydramine hcl, Clarithromycin, and Oxycodone  Family History  Problem Relation Age of Onset   Heart disease Mother    Cancer Paternal Grandmother     Social History Social History   Tobacco Use   Smoking status: Never   Smokeless tobacco: Never  Vaping Use   Vaping Use: Never used  Substance Use Topics   Alcohol use: Never   Drug use: Never    Review of Systems  Constitutional: No fever/chills Eyes: No visual changes. ENT: No sore throat. Cardiovascular chest pain. Respiratory:  shortness of breath. Gastrointestinal: No abdominal pain.  No nausea, no vomiting.  No diarrhea.  No constipation. Genitourinary: Negative for dysuria. Musculoskeletal: Upper back pain. Skin: Negative for rash. Neurological: Negative for headaches, focal weakness  ____________________________________________   PHYSICAL EXAM:  VITAL SIGNS: ED Triage Vitals  Enc Vitals Group     BP 12/01/21 0702 (!) 185/83      Pulse Rate 12/01/21 0702 66     Resp 12/01/21 0702 (!) 22     Temp 12/01/21 0705 98.1 F (36.7 C)     Temp Source 12/01/21 0702 Oral     SpO2 12/01/21 0702 100 %     Weight 12/01/21 0705 171 lb 15.3 oz (78 kg)     Height 12/01/21 0705 5\' 6"  (1.676 m)     Head Circumference --      Peak Flow --      Pain Score 12/01/21 0705 10     Pain Loc --      Pain Edu? --      Excl. in [place]? --    Constitutional: Alert and oriented.  Very anxious and scared Eyes: Conjunctivae are normal.I. Head: Atraumatic. Nose: No congestion/rhinnorhea. Mouth/Throat: Mucous membranes are moist.  Oropharynx non-erythematous. Neck: No stridor.  Cardiovascular: Normal rate, regular rhythm. Grossly normal heart sounds.  Good peripheral circulation. Respiratory: Normal respiratory effort.  No retractions. Lungs CTAB. Gastrointestinal: Soft and nontender. No distention. No abdominal bruits.  Musculoskeletal: No lower extremity tenderness  Neurologic: Patient is very anxious but moving all extremities equally and well.  Please see HPI for description of speech Skin:  Skin is warm, dry and intact. No rash noted.  Patient began yelling when I tried to look at her wound.  My very experienced ER nurse was able to evaluate the wound painlessly and said it looks nice with no sign of infection or any other problems   ____________________________________________   LABS (all labs ordered are listed, but only abnormal results are displayed)  Labs Reviewed  BASIC METABOLIC PANEL - Abnormal; Notable for the following components:      Result Value   Glucose, Bld 103 (*)    BUN 7 (*)    GFR, Estimated 58 (*)    All other components within normal limits  CBC - Abnormal; Notable for the following components:   WBC 13.6 (*)    RBC 3.63 (*)    Hemoglobin 10.5 (*)    HCT 31.7 (*)    All other components within normal limits  TROPONIN I (HIGH SENSITIVITY)  TROPONIN I (HIGH SENSITIVITY)    ____________________________________________  EKG  EKG read interpreted by me shows normal sinus rhythm rate of 63 left axis no acute ST-T wave changes ____________________________________________  RADIOLOGY Gertha Calkin, personally viewed and evaluated these images (plain radiographs) as part of my medical decision making, as  well as reviewing the written report by the radiologist.  ED MD interpretation: X-ray read by radiology reviewed by me shows no active cardiopulmonary disease.  Official radiology report(s): DG Chest 2 View  Result Date: 12/01/2021 CLINICAL DATA:  shob EXAM: CHEST - 2 VIEW COMPARISON:  07/26/2018. FINDINGS: Low lung volumes. Enlarged cardiac silhouette, likely accentuated by AP technique and low lung volumes. Tubing projects over the left lower chest wall/abdomen, possibly a drain in this patient status post reported left mastectomy. Clips project in this region. Also, cholecystectomy clips. IMPRESSION: 1. No evidence of acute cardiopulmonary disease. 2. Cardiomegaly. 3. Left mastectomy with postsurgical change. Electronically Signed   By: Margaretha Sheffield M.D.   On: 12/01/2021 08:11    ____________________________________________   PROCEDURES  Procedure(s) performed (including Critical Care):  Procedures   ____________________________________________   INITIAL IMPRESSION / ASSESSMENT AND PLAN / ED COURSE  Patient with a lot of difficulty speaking which is reported to me by her nurse and her husband is being present only when she is anxious.  I cannot get her to relax when I am there with her to verify this.  Patient is moving all extremities equally and well however.  Patient with recent surgery sudden onset of severe pain which is at least partially pleuritic and shortness of breath.  D-dimer would be positive for 5 July due to recent surgery.  I will get a CT angio of her chest.  This will evaluate for any upper back thoracic compression  fractures we will evaluate for pneumothorax pneumonia pulmonary embolus chest wall problems or really anything that would be likely to be causing this patient's pain.  Patient's troponin and EKG are okay.  We will try to get the second troponin when we finally get the IV in. I have signed this patient out to the oncoming physician.             ____________________________________________   FINAL CLINICAL IMPRESSION(S) / ED DIAGNOSES  Final diagnoses:  Dyspnea, unspecified type  Precordial pain     ED Discharge Orders     None        Note:  This document was prepared using Dragon voice recognition software and may include unintentional dictation errors.    Nena Polio, MD 12/01/21 [phone]

## 2021-12-01 NOTE — ED Notes (Signed)
Pt assisted to restroom. C/o increased pain to left breast and back. Pain relieved when back rubbed.

## 2021-12-01 NOTE — ED Triage Notes (Signed)
Pt to ED for shob and back pain that started this am. Reports recent left side mastectomy on 12/16. Pt repeatedly saying "help me" Pt has wrapping/bandage to left side

## 2021-12-01 NOTE — Discharge Instructions (Addendum)
As we discussed, I suspect your symptoms are due to muscle pain and spasm related to your surgery and physical therapy.  For now, I'd recommend the following: - Take Tylenol 1000 mg every 6 hours for the next 2-3 days, regardless of whether you are hurting - Take the Ultram 50 mg at least once in the morning, and at night as needed. - It's important to take the medication regularly to stay ON TOP of your pain. - If you get pain like this again, take deep breaths and ask a family member to help you stand up, stretch, and see if you can work through it  - Try to sit upright as often as possible and take deep

## 2021-12-01 NOTE — ED Notes (Signed)
Pt resting with eyes closed. Respirations non labored and equal. VSS. NAD. Will continue to monitor.

## 2021-12-14 ENCOUNTER — Emergency Department
Admission: EM | Admit: 2021-12-14 | Discharge: 2021-12-15 | Disposition: A | Discharge status: Skilled Nursing Facility | Payer: Medicare PPO | Attending: Emergency Medicine | Admitting: Emergency Medicine

## 2021-12-14 ENCOUNTER — Other Ambulatory Visit: Payer: Self-pay

## 2021-12-14 ENCOUNTER — Encounter: Payer: Self-pay | Admitting: Emergency Medicine

## 2021-12-14 ENCOUNTER — Emergency Department: Payer: Medicare PPO

## 2021-12-14 DIAGNOSIS — N189 Chronic kidney disease, unspecified: Secondary | ICD-10-CM | POA: Insufficient documentation

## 2021-12-14 DIAGNOSIS — S8991XA Unspecified injury of right lower leg, initial encounter: Secondary | ICD-10-CM | POA: Insufficient documentation

## 2021-12-14 DIAGNOSIS — R42 Dizziness and giddiness: Secondary | ICD-10-CM | POA: Insufficient documentation

## 2021-12-14 DIAGNOSIS — Z20822 Contact with and (suspected) exposure to covid-19: Secondary | ICD-10-CM | POA: Insufficient documentation

## 2021-12-14 DIAGNOSIS — Z853 Personal history of malignant neoplasm of breast: Secondary | ICD-10-CM | POA: Insufficient documentation

## 2021-12-14 DIAGNOSIS — I129 Hypertensive chronic kidney disease with stage 1 through stage 4 chronic kidney disease, or unspecified chronic kidney disease: Secondary | ICD-10-CM | POA: Insufficient documentation

## 2021-12-14 DIAGNOSIS — W01198A Fall on same level from slipping, tripping and stumbling with subsequent striking against other object, initial encounter: Secondary | ICD-10-CM | POA: Diagnosis not present

## 2021-12-14 DIAGNOSIS — M25561 Pain in right knee: Secondary | ICD-10-CM

## 2021-12-14 DIAGNOSIS — R296 Repeated falls: Secondary | ICD-10-CM

## 2021-12-14 LAB — CBC WITH DIFFERENTIAL/PLATELET
Abs Immature Granulocytes: 0.03 10*3/uL (ref 0.00–0.07)
Basophils Absolute: 0 10*3/uL (ref 0.0–0.1)
Basophils Relative: 1 %
Eosinophils Absolute: 0.3 10*3/uL (ref 0.0–0.5)
Eosinophils Relative: 4 %
HCT: 48.9 % — ABNORMAL HIGH (ref 36.0–46.0)
Hemoglobin: 16.4 g/dL — ABNORMAL HIGH (ref 12.0–15.0)
Immature Granulocytes: 1 %
Lymphocytes Relative: 11 %
Lymphs Abs: 0.6 10*3/uL — ABNORMAL LOW (ref 0.7–4.0)
MCH: 28.4 pg (ref 26.0–34.0)
MCHC: 33.5 g/dL (ref 30.0–36.0)
MCV: 84.7 fL (ref 80.0–100.0)
Monocytes Absolute: 0.7 10*3/uL (ref 0.1–1.0)
Monocytes Relative: 12 %
Neutro Abs: 4.2 10*3/uL (ref 1.7–7.7)
Neutrophils Relative %: 71 %
Platelets: 162 10*3/uL (ref 150–400)
RBC: 5.77 MIL/uL — ABNORMAL HIGH (ref 3.87–5.11)
RDW: 14.4 % (ref 11.5–15.5)
WBC: 5.8 10*3/uL (ref 4.0–10.5)
nRBC: 0 % (ref 0.0–0.2)

## 2021-12-14 LAB — BASIC METABOLIC PANEL
Anion gap: 11 (ref 5–15)
BUN: 11 mg/dL (ref 8–23)
CO2: 24 mmol/L (ref 22–32)
Calcium: 8.8 mg/dL — ABNORMAL LOW (ref 8.9–10.3)
Chloride: 97 mmol/L — ABNORMAL LOW (ref 98–111)
Creatinine, Ser: 0.94 mg/dL (ref 0.44–1.00)
GFR, Estimated: >60 mL/min (ref >60)
Glucose, Bld: 91 mg/dL (ref 70–99)
Potassium: 3.2 mmol/L — ABNORMAL LOW (ref 3.5–5.1)
Sodium: 132 mmol/L — ABNORMAL LOW (ref 135–145)

## 2021-12-14 LAB — RESP PANEL BY RT-PCR (FLU A&B, COVID) ARPGX2
Influenza A by PCR: NEGATIVE
Influenza B by PCR: NEGATIVE
SARS Coronavirus 2 by RT PCR: NEGATIVE

## 2021-12-14 LAB — TROPONIN I (HIGH SENSITIVITY): Troponin I (High Sensitivity): 9 ng/L (ref <18)

## 2021-12-14 MED ORDER — LEVOTHYROXINE SODIUM 50 MCG PO TABS
75.0000 ug | ORAL_TABLET | Freq: Every day | ORAL | Status: DC
  Administered 2021-12-15: 75 ug via ORAL
  Filled 2021-12-14: qty 1

## 2021-12-14 MED ORDER — ACETAMINOPHEN 500 MG PO TABS
500.0000 mg | ORAL_TABLET | Freq: Four times a day (QID) | ORAL | Status: DC | PRN
  Administered 2021-12-15: 1000 mg via ORAL
  Filled 2021-12-14: qty 2

## 2021-12-14 MED ORDER — LIDOCAINE 5 % EX PTCH
1.0000 | MEDICATED_PATCH | CUTANEOUS | Status: DC
  Administered 2021-12-14 – 2021-12-15 (×2): 1 via TRANSDERMAL
  Filled 2021-12-14 (×2): qty 1

## 2021-12-14 MED ORDER — POTASSIUM CHLORIDE CRYS ER 20 MEQ PO TBCR
20.0000 meq | EXTENDED_RELEASE_TABLET | Freq: Two times a day (BID) | ORAL | Status: DC
  Administered 2021-12-14 – 2021-12-15 (×3): 20 meq via ORAL
  Filled 2021-12-14 (×3): qty 1

## 2021-12-14 MED ORDER — IRBESARTAN 150 MG PO TABS
300.0000 mg | ORAL_TABLET | Freq: Every day | ORAL | Status: DC
  Administered 2021-12-14 – 2021-12-15 (×2): 300 mg via ORAL
  Filled 2021-12-14 (×2): qty 2

## 2021-12-14 MED ORDER — METOPROLOL SUCCINATE ER 50 MG PO TB24
50.0000 mg | ORAL_TABLET | Freq: Two times a day (BID) | ORAL | Status: DC
  Administered 2021-12-14 – 2021-12-15 (×3): 50 mg via ORAL
  Filled 2021-12-14 (×3): qty 1

## 2021-12-14 MED ORDER — ATORVASTATIN CALCIUM 20 MG PO TABS
80.0000 mg | ORAL_TABLET | Freq: Every day | ORAL | Status: DC
  Administered 2021-12-14 – 2021-12-15 (×2): 80 mg via ORAL
  Filled 2021-12-14 (×2): qty 4

## 2021-12-14 MED ORDER — VITAMIN B-12 100 MCG PO TABS
100.0000 ug | ORAL_TABLET | Freq: Two times a day (BID) | ORAL | Status: DC
  Administered 2021-12-14 – 2021-12-15 (×2): 100 ug via ORAL
  Filled 2021-12-14 (×5): qty 1

## 2021-12-14 MED ORDER — ASPIRIN EC 81 MG PO TBEC
81.0000 mg | DELAYED_RELEASE_TABLET | Freq: Every day | ORAL | Status: DC
  Administered 2021-12-14 – 2021-12-15 (×2): 81 mg via ORAL
  Filled 2021-12-14 (×2): qty 1

## 2021-12-14 MED ORDER — MORPHINE SULFATE (PF) 4 MG/ML IV SOLN
4.0000 mg | Freq: Once | INTRAVENOUS | Status: AC
  Administered 2021-12-14: 4 mg via INTRAVENOUS
  Filled 2021-12-14: qty 1

## 2021-12-14 MED ORDER — POTASSIUM CHLORIDE CRYS ER 20 MEQ PO TBCR
40.0000 meq | EXTENDED_RELEASE_TABLET | Freq: Once | ORAL | Status: AC
  Administered 2021-12-14: 40 meq via ORAL
  Filled 2021-12-14: qty 2

## 2021-12-14 MED ORDER — HYDRALAZINE HCL 50 MG PO TABS
25.0000 mg | ORAL_TABLET | Freq: Three times a day (TID) | ORAL | Status: DC
  Administered 2021-12-14 – 2021-12-15 (×3): 25 mg via ORAL
  Filled 2021-12-14 (×3): qty 1

## 2021-12-14 MED ORDER — FLUOXETINE HCL 10 MG PO CAPS
10.0000 mg | ORAL_CAPSULE | Freq: Every day | ORAL | Status: DC
  Administered 2021-12-14 – 2021-12-15 (×2): 10 mg via ORAL
  Filled 2021-12-14 (×3): qty 1

## 2021-12-14 MED ORDER — ONDANSETRON HCL 4 MG/2ML IJ SOLN
4.0000 mg | Freq: Once | INTRAMUSCULAR | Status: AC
Start: 2021-12-14 — End: 2021-12-14
  Administered 2021-12-14: 4 mg via INTRAVENOUS
  Filled 2021-12-14: qty 2

## 2021-12-14 MED ORDER — LACTATED RINGERS IV BOLUS
1000.0000 mL | Freq: Once | INTRAVENOUS | Status: AC
  Administered 2021-12-14: 1000 mL via INTRAVENOUS

## 2021-12-14 MED ORDER — MAGNESIUM OXIDE 400 MG PO TABS
400.0000 mg | ORAL_TABLET | Freq: Every day | ORAL | Status: DC
  Administered 2021-12-14 – 2021-12-15 (×2): 400 mg via ORAL
  Filled 2021-12-14 (×3): qty 1

## 2021-12-14 NOTE — ED Provider Notes (Signed)
[facility] Provider Note    Event Date/Time   First MD Initiated Contact with Patient 12/14/21 [phone]     (approximate)   History   Chief Complaint Knee Pain   HPI  Cindy Huerta is a 76 y.o. female with past medical history of hypertension, hyperlipidemia, CKD, and breast cancer who presents to the ED complaining of knee pain.  Patient reports that she was feeling lightheaded just after getting out of the shower 3 days ago, ended up falling onto her right knee.  She denies hitting her head or losing consciousness, does not take any blood thinners.  She had immediate onset of pain in her right knee however it was tolerable and she was able to bear weight on the right leg.  She then had a second fall following another episode of lightheadedness, again striking her right knee.  She has been unable to bear any weight on the right leg since then with severe pain, typically gets around with a walker but has been unable to do so even with the assistance of family.  Patient denies any other injuries, specifically denies headache, neck pain, upper extremity pain, or left lower extremity pain.  She has not had any chest pain or shortness of breath.  She denies any prior injuries to her right knee.     Physical Exam   Triage Vital Signs: ED Triage Vitals  Enc Vitals Group     BP 12/14/21 0709 (!) 181/80     Pulse Rate 12/14/21 0709 66     Resp 12/14/21 0709 16     Temp 12/14/21 0709 98.1 F (36.7 C)     Temp Source 12/14/21 0709 Oral     SpO2 12/14/21 0709 99 %     Weight 12/14/21 0705 171 lb 15.3 oz (78 kg)     Height 12/14/21 0705 5\' 6"  (1.676 m)     Head Circumference --      Peak Flow --      Pain Score 12/14/21 0705 8     Pain Loc --      Pain Edu? --      Excl. in [place]? --     Most recent vital signs: Vitals:   12/14/21 1100 12/14/21 1214  BP: (!) 174/78 136/63  Pulse: 81 77  Resp:  16  Temp:    SpO2: 99% 97%    Constitutional: Alert and  oriented. Eyes: Conjunctivae are normal. Head: Atraumatic. Nose: No congestion/rhinnorhea. Mouth/Throat: Mucous membranes are moist.  Neck: No midline cervical spine tenderness to palpation. Cardiovascular: Normal rate, regular rhythm. Grossly normal heart sounds.  2+ DP pulses bilaterally. Respiratory: Normal respiratory effort.  No retractions. Lungs CTAB.  Sutures in place to left chest with surgical site clean, dry, and intact.  JP drain in place with serous drainage. Gastrointestinal: Soft and nontender. No distention. Genitourinary: Deferred. Musculoskeletal: Diffuse tenderness to palpation of right knee with no obvious deformity, no tenderness noted right hip or ankle.  No tenderness to palpation noted of left lower extremity or bilateral upper extremities. Neurologic:  Normal speech and language. No gross focal neurologic deficits are appreciated.    ED Results / Procedures / Treatments   Labs (all labs ordered are listed, but only abnormal results are displayed) Labs Reviewed  CBC WITH DIFFERENTIAL/PLATELET - Abnormal; Notable for the following components:      Result Value   RBC 5.77 (*)    Hemoglobin 16.4 (*)    HCT 48.9 (*)  Lymphs Abs 0.6 (*)    All other components within normal limits  BASIC METABOLIC PANEL - Abnormal; Notable for the following components:   Sodium 132 (*)    Potassium 3.2 (*)    Chloride 97 (*)    Calcium 8.8 (*)    All other components within normal limits  RESP PANEL BY RT-PCR (FLU A&B, COVID) ARPGX2  TROPONIN I (HIGH SENSITIVITY)  TROPONIN I (HIGH SENSITIVITY)     EKG  ED ECG REPORT I, Blake Divine, the attending physician, personally viewed and interpreted this ECG.   Date: 12/14/2021  EKG Time: 13:50  Rate: 81  Rhythm: normal sinus rhythm, occasional PVC noted, unifocal  Axis: Normal  Intervals:none  ST&T Change: None  RADIOLOGY X-ray of right knee reviewed by me with no fracture or dislocation, negative for acute process  per radiology.  CT performed and consistent with osteoarthritis with small effusion.  PROCEDURES:  Critical Care performed: No  .1-3 Lead EKG Interpretation Performed by: Blake Divine, MD Authorized by: Blake Divine, MD     Interpretation: normal     ECG rate:  65-80   ECG rate assessment: normal     Rhythm: sinus rhythm     Ectopy: none     Conduction: normal     MEDICATIONS ORDERED IN ED: Medications  acetaminophen (TYLENOL) tablet 500-1,000 mg (has no administration in time range)  aspirin EC tablet 81 mg (has no administration in time range)  atorvastatin (LIPITOR) tablet 80 mg (has no administration in time range)  vitamin B-12 (CYANOCOBALAMIN) tablet 100 mcg (has no administration in time range)  FLUoxetine (PROZAC) capsule 10 mg (has no administration in time range)  hydrALAZINE (APRESOLINE) tablet 25 mg (has no administration in time range)  levothyroxine (SYNTHROID) tablet 75 mcg (has no administration in time range)  magnesium oxide (MAG-OX) tablet 400 mg (has no administration in time range)  metoprolol succinate (TOPROL-XL) 24 hr tablet 50 mg (has no administration in time range)  potassium chloride SA (KLOR-CON M) CR tablet 20 mEq (has no administration in time range)  irbesartan (AVAPRO) tablet 300 mg (has no administration in time range)  lidocaine (LIDODERM) 5 % 1 patch (has no administration in time range)  lactated ringers bolus 1,000 mL (0 mLs Intravenous Stopped 12/14/21 1206)  morphine 4 MG/ML injection 4 mg (4 mg Intravenous Given 12/14/21 0925)  ondansetron (ZOFRAN) injection 4 mg (4 mg Intravenous Given 12/14/21 0925)  potassium chloride SA (KLOR-CON M) CR tablet 40 mEq (40 mEq Oral Given 12/14/21 0948)     IMPRESSION / MDM / ASSESSMENT AND PLAN / ED COURSE  I reviewed the triage vital signs and the nursing notes.                              76 y.o. female with past medical history of hypertension, hyperlipidemia, CKD, and breast cancer status post  recent mastectomy who presents to the ED following episode of lightheadedness with 2 falls 3 days ago, both striking her right knee.  Patient primarily complains of severe pain to her right knee with no evidence of other traumatic injury.  Differential diagnosis includes, but is not limited to, knee fracture, knee dislocation, knee contusion, arrhythmia, ACS, electrode abnormality, dehydration.  Patient is neurovascular intact to her right lower extremity with no obvious deformities, we will further assess with x-ray.  Chart reviewed from recent surgical intervention and she is status post recent left mastectomy with  surgical site healing well today with JP drain in place.  We will check for etiology of her lightheadedness with EKG and labs, no focal deficits to suggest stroke.  Patient received IM fentanyl by EMS and pain is improved, will hydrate with IV fluids and reassess.  The patient is on the cardiac monitor to evaluate for evidence of arrhythmia and/or significant heart rate changes.  CBC shows no evidence of anemia and BMP remarkable only for mild hypokalemia.  Troponin within normal limits and I doubt cardiac etiology for her symptoms.  X-ray imaging of right knee reviewed by me with no obvious fracture or dislocation, negative for acute process per radiology.  Due to concern for occult fracture, CT was performed and also negative for acute process.  Her right knee pain appears due to osteoarthritis.  Despite pain medication, patient continues to have significant difficulty ambulating and family is concerned that she would currently be unsafe at home.  They are requesting assistance to help care for her at home and we will have patient evaluated by physical therapy, consult social work.      FINAL CLINICAL IMPRESSION(S) / ED DIAGNOSES   Final diagnoses:  Multiple falls  Acute pain of right knee     Rx / DC Orders   ED Discharge Orders     None        Note:  This document was  prepared using Dragon voice recognition software and may include unintentional dictation errors.   Blake Divine, MD 12/14/21 1357

## 2021-12-14 NOTE — ED Notes (Signed)
Pt states she's in pain and needs pain meds.

## 2021-12-14 NOTE — ED Triage Notes (Signed)
Fell on Friday onto right knee near the shower.  Felt dizzy prior, and has history of vertigo--uses walker to walk.  Recent mastectomy on dec 16th.  Has drain to breast.  Ems gave fentanyl 50 IM

## 2021-12-14 NOTE — ED Notes (Signed)
Pt placed on bedpan per pt request.

## 2021-12-14 NOTE — TOC Initial Note (Signed)
Transition of Care [facility]) - Initial/Assessment Note    Patient Details  Name: Cindy Huerta MRN: [id number] Date of Birth: 03-07-1946  Transition of Care [facility]) CM/SW Contact:    Shelbie Hutching, RN Phone Number: 12/14/2021, 3:39 PM  Clinical Narrative:                 Patient brought from home to the emergency department for falling and right knee pain.  TOC consult for placement to short term rehab.  RNCM met with patient at the bedside She fell this past Friday on her knees and since then has been having pain and difficulty with ambulation.  Patient is from home with her son and his wife.  Patient is usually independent and prior to her mastectomy was driving everyday, she has a walker at home from her last hospitalization. PT is recommending SNF and patient's son agrees.  RNCM will start bed search.    Expected Discharge Plan: Skilled Nursing Facility Barriers to Discharge: Barriers Unresolved (comment), SNF Pending bed offer   Patient Goals and CMS Choice Patient states their goals for this hospitalization and ongoing recovery are:: Patient's son would like rehab if that is what is recommended CMS Medicare.gov Compare Post Acute Care list provided to:: Patient Choice offered to / list presented to : Patient, Adult Children  Expected Discharge Plan and Services Expected Discharge Plan: [place]   Discharge Planning Services: CM Consult   Living arrangements for the past 2 months: Single Family Home                 DME Arranged: N/A DME Agency: NA       HH Arranged: NA HH Agency: NA        Prior Living Arrangements/Services Living arrangements for the past 2 months: Single Family Home Lives with:: Adult Children Patient language and need for interpreter reviewed:: Yes Do you feel safe going back to the place where you live?: Yes      Need for Family Participation in Patient Care: Yes (Comment) Care giver support system in place?: Yes (comment) Current  home services: DME (walker) Criminal Activity/Legal Involvement Pertinent to Current Situation/Hospitalization: No - Comment as needed  Activities of Daily Living      Permission Sought/Granted Permission sought to share information with : Case Manager, Customer service manager, Family Supports Permission granted to share information with : Yes, Verbal Permission Granted  Share Information with NAME: [name]  Permission granted to share info w AGENCY: SNF's  Permission granted to share info w Relationship: son  Permission granted to share info w Contact Information: [phone]  Emotional Assessment Appearance:: Appears stated age Attitude/Demeanor/Rapport: Engaged Affect (typically observed): Accepting Orientation: : Oriented to Self, Oriented to Place, Oriented to  Time, Oriented to Situation Alcohol / Substance Use: Not Applicable Psych Involvement: No (comment)  Admission diagnosis:  EMS Knee Injury Patient Active Problem List   Diagnosis Date Noted   HTN (hypertension) 11/24/2021   Normocytic anemia 11/24/2021   Vertigo 11/24/2021   Breast CA ([place]) 11/22/2021   Breast cancer ([place]) 11/20/2021   Intractable nausea and vomiting 10/09/2021   Weakness 10/07/2021   Carcinoma of overlapping sites of left breast in female, estrogen receptor positive ([place]) 10/06/2021   Dizziness 10/06/2021   Rectal bleeding 10/06/2021   Nausea & vomiting 10/06/2021   GIB (gastrointestinal bleeding) 10/06/2021   Left-sided weakness 07/29/2021   HLD (hyperlipidemia) 07/29/2021   Hypothyroidism 07/29/2021   Left sided numbness 07/29/2021   Depression  CKD (chronic kidney disease), stage IIIa    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   Epigastric pain 04/23/2021   GERD (gastroesophageal reflux disease) 04/23/2021   Benign essential hypertension 01/20/2021   History of breast cancer 01/20/2021   History of TIA (transient ischemic attack) 01/20/2021   Palpitations 01/20/2021   Malignant  neoplasm of upper-outer quadrant of left female breast ([place]) 12/06/2018   PCP:  Juluis Pitch, MD Pharmacy:   [id number] [id number]: [phone]: [phone]    Social Determinants of Health (SDOH) Interventions    Readmission Risk Interventions Readmission Risk Prevention Plan 11/25/2021 10/10/2021 10/09/2021  Transportation Screening Complete Complete Complete  PCP or Specialist Appt within 5-7 Days - Complete -  PCP or Specialist Appt within 3-5 Days Complete - -  Home Care Screening - Complete -  Medication Review (RN CM) - Complete Complete  HRI or Home Care Consult Complete - -  Social Work Consult for [place]/Counseling Complete - -  Palliative Care Screening Not Applicable - -  Medication Review (RN Care Manager) Complete - -  Some recent data might be hidden

## 2021-12-14 NOTE — Evaluation (Signed)
Physical Therapy Evaluation Patient Details Name: Cindy Huerta MRN: [id number] DOB: Aug 20, 1946 Today's Date: 12/14/2021  History of Present Illness  Cindy Huerta is a 76 y.o. female with past medical history of hypertension, hyperlipidemia, TIA, and breast cancer who underwent Left breast mastectomy wound reexploration and hematoma evacuation back in December.  Pt is currently in ED complaining of R knee pain after having 2 falls on Friday.    Clinical Impression  Pt received in supine position and agreeable to therapy.  Pt with some difficulty when responding at times not able to get words out.  Pt agreeable to orthostatic vitals being taken due to last visit having orthostatic hypotension documented from previous therapist and pt discharged home against recommendation of SNF.  Pt was found to have the following:  Orthostatic VS for the past 24 hrs:  BP- Lying Pulse- Lying BP- Sitting Pulse- Sitting BP- Standing at 0 minutes Pulse- Standing at 0 minutes  12/14/21 1432 165/84 76 (!) 142/92 77 136/77 85   Pt unable to stand for longer than 3 seconds due to pain in the knee, however she also was experiencing dizziness and requesting to lie back down.  Pt assisted to supine position and placed in Trendelenburg position to alleviate symptoms.  Pt reassured and calmed throughout session, however pt still very anxious stating "I don't feel like myself".  Pt BP taken in supine position immediately upon returning to supine and found to be 178/81, then 171/78 after a few minutes in supine position.  Pt recommendation to SNF at this time due to decreased caregiver support and living situation at son's house.          Recommendations for follow up therapy are one component of a multi-disciplinary discharge planning process, led by the attending physician.  Recommendations may be updated based on patient status, additional functional criteria and insurance authorization.  Follow Up Recommendations  Skilled nursing-short term rehab (<3 hours/day)    Assistance Recommended at Discharge Frequent or constant Supervision/Assistance  Patient can return home with the following  A little help with walking and/or transfers;Help with stairs or ramp for entrance    Equipment Recommendations Rolling walker (2 wheels);BSC/3in1  Recommendations for Other Services       Functional Status Assessment Patient has had a recent decline in their functional status and demonstrates the ability to make significant improvements in function in a reasonable and predictable amount of time.     Precautions / Restrictions Precautions Precautions: Fall Precaution Comments: L JP drain Restrictions Weight Bearing Restrictions: No      Mobility  Bed Mobility Overal bed mobility: Needs Assistance Bed Mobility: Supine to Sit;Sit to Supine     Supine to sit: Supervision Sit to supine: Min guard;HOB elevated   General bed mobility comments: increased assist for LEs back to bed Patient Response: Anxious  Transfers Overall transfer level: Needs assistance Equipment used: Rolling walker (2 wheels) Transfers: Sit to/from Stand Sit to Stand: Min guard           General transfer comment: Pt only stood for 1-2 seconds before stating that she her leg could not stand the weight and became very anxious.    Ambulation/Gait               General Gait Details: deferred secondary to symptomatic orthostasis, syncopal episode, anxiety  Stairs            Wheelchair Mobility    Modified Rankin (Stroke Patients Only)  Balance Overall balance assessment: Needs assistance Sitting-balance support: No upper extremity supported;Feet supported Sitting balance-Leahy Scale: Good     Standing balance support: Bilateral upper extremity supported Standing balance-Leahy Scale: Fair                               Pertinent Vitals/Pain Pain Assessment: Faces Faces Pain Scale:  Hurts even more Pain Location: R knee with movement Pain Descriptors / Indicators: Aching Pain Intervention(s): Limited activity within patient's tolerance;Monitored during session;Premedicated before session;Repositioned    Home Living Family/patient expects to be discharged to:: Private residence Living Arrangements: Children (son and DIL) Available Help at Discharge: Family;Available 24 hours/day Type of Home: House Home Access: Stairs to enter Entrance Stairs-Rails: None Entrance Stairs-Number of Steps: 1-2 Alternate Level Stairs-Number of Steps: 1/2 flight + landing + 1/2 flight Home Layout: Bed/bath upstairs;Two level Home Equipment: Conservation officer, nature (2 wheels);Cane - quad Additional Comments: son works from home    Prior Function Prior Level of Function : Independent/Modified Independent             Mobility Comments: Per previous session: Pt & son report pt was independent without AD prior to admisison ADLs Comments: Independent in ADL/IADL PTA     Hand Dominance   Dominant Hand: Right    Extremity/Trunk Assessment   Upper Extremity Assessment Upper Extremity Assessment: Generalized weakness    Lower Extremity Assessment Lower Extremity Assessment: Generalized weakness;RLE deficits/detail RLE Deficits / Details: Painful knee and states she is unable to place weight through it. RLE: Unable to fully assess due to pain       Communication   Communication:  (intermittent word-finding difficulties)  Cognition Arousal/Alertness: Awake/alert Behavior During Therapy: WFL for tasks assessed/performed Overall Cognitive Status: Within Functional Limits for tasks assessed                                          General Comments      Exercises     Assessment/Plan    PT Assessment Patient needs continued PT services  PT Problem List Decreased strength;Decreased activity tolerance;Decreased balance;Decreased range of motion;Decreased  mobility;Decreased knowledge of use of DME;Decreased safety awareness;Decreased knowledge of precautions;Pain       PT Treatment Interventions DME instruction;Gait training;Stair training;Functional mobility training;Therapeutic activities;Therapeutic exercise;Balance training;Patient/family education    PT Goals (Current goals can be found in the Care Plan section)  Acute Rehab PT Goals Patient Stated Goal: to get better PT Goal Formulation: With patient Time For Goal Achievement: 12/28/21 Potential to Achieve Goals: Fair    Frequency Min 2X/week     Co-evaluation               AM-PAC PT "6 Clicks" Mobility  Outcome Measure Help needed turning from your back to your side while in a flat bed without using bedrails?: A Little Help needed moving from lying on your back to sitting on the side of a flat bed without using bedrails?: A Little Help needed moving to and from a bed to a chair (including a wheelchair)?: A Little Help needed standing up from a chair using your arms (e.g., wheelchair or bedside chair)?: A Little Help needed to walk in hospital room?: Total Help needed climbing 3-5 steps with a railing? : Total 6 Click Score: 14    End of Session   Activity Tolerance:  Patient limited by pain;Treatment limited secondary to medical complications (Comment) Patient left: in bed;with call bell/phone within reach;with bed alarm set Nurse Communication: Mobility status PT Visit Diagnosis: Muscle weakness (generalized) (M62.81);Difficulty in walking, not elsewhere classified (R26.2)    Time: 2482-5003 PT Time Calculation (min) (ACUTE ONLY): 35 min   Charges:   PT Evaluation $PT Eval High Complexity: 1 High PT Treatments $Therapeutic Activity: 23-37 mins        Gwenlyn Saran, PT, DPT 12/14/21, 3:04 PM   Christie Nottingham 12/14/2021, 2:59 PM

## 2021-12-14 NOTE — NC FL2 (Signed)
[place] LEVEL OF CARE SCREENING TOOL     IDENTIFICATION  Patient Name: Cindy Huerta Birthdate: Jan 28, 1946 Sex: female Admission Date (Current Location): 12/14/2021  [facility] and [place] Number:  Engineering geologist and Address:  [facility], [address], [place], [place] [address]      Provider Number: [id number]  Attending Physician Name and Address:  No att. providers found  Relative Name and Phone Number:  Myrtha Tonkovich -son- [phone]    Current Level of Care: Hospital Recommended Level of Care: [place] Prior Approval Number:    Date Approved/Denied:   PASRR Number: pending  Discharge Plan: SNF    Current Diagnoses: Patient Active Problem List   Diagnosis Date Noted   HTN (hypertension) 11/24/2021   Normocytic anemia 11/24/2021   Vertigo 11/24/2021   Breast CA ([place]) 11/22/2021   Breast cancer ([place]) 11/20/2021   Intractable nausea and vomiting 10/09/2021   Weakness 10/07/2021   Carcinoma of overlapping sites of left breast in female, estrogen receptor positive ([place]) 10/06/2021   Dizziness 10/06/2021   Rectal bleeding 10/06/2021   Nausea & vomiting 10/06/2021   GIB (gastrointestinal bleeding) 10/06/2021   Left-sided weakness 07/29/2021   HLD (hyperlipidemia) 07/29/2021   Hypothyroidism 07/29/2021   Left sided numbness 07/29/2021   Depression    CKD (chronic kidney disease), stage IIIa    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   Epigastric pain 04/23/2021   GERD (gastroesophageal reflux disease) 04/23/2021   Benign essential hypertension 01/20/2021   History of breast cancer 01/20/2021   History of TIA (transient ischemic attack) 01/20/2021   Palpitations 01/20/2021   Malignant neoplasm of upper-outer quadrant of left female breast ([place]) 12/06/2018    Orientation RESPIRATION BLADDER Height & Weight     Self, Time, Situation, Place  Normal Continent Weight: 78 kg Height:  5\' 6"  (167.6 cm)   BEHAVIORAL SYMPTOMS/MOOD NEUROLOGICAL BOWEL NUTRITION STATUS      Continent Diet (dysphagia diet 3)  AMBULATORY STATUS COMMUNICATION OF NEEDS Skin   Extensive Assist Verbally Normal                       Personal Care Assistance Level of Assistance  Bathing, Feeding, Dressing Bathing Assistance: Limited assistance Feeding assistance: Independent Dressing Assistance: Limited assistance     Functional Limitations Info  Sight, Hearing, Speech Sight Info: Adequate Hearing Info: Adequate Speech Info: Adequate    SPECIAL CARE FACTORS FREQUENCY  PT (By licensed PT), OT (By licensed OT)     PT Frequency: 5 times per week OT Frequency: 5 days per week            Contractures Contractures Info: Not present    Additional Factors Info  Code Status, Allergies Code Status Info: Full Allergies Info: Latex, lisinopril, procaine, diphenhydraine, clarithromycin, oxycodone           Current Medications (12/14/2021):  This is the current hospital active medication list Current Facility-Administered Medications  Medication Dose Route Frequency Provider Last Rate Last Admin   acetaminophen (TYLENOL) tablet 500-1,000 mg  500-1,000 mg Oral Q6H PRN Blake Divine, MD       aspirin EC tablet 81 mg  81 mg Oral Daily Blake Divine, MD   81 mg at 12/14/21 1526   atorvastatin (LIPITOR) tablet 80 mg  80 mg Oral Daily Blake Divine, MD   80 mg at 12/14/21 1525   FLUoxetine (PROZAC) capsule 10 mg  10 mg Oral Daily Blake Divine, MD  10 mg at 12/14/21 1529   hydrALAZINE (APRESOLINE) tablet 25 mg  25 mg Oral TID Blake Divine, MD   25 mg at 12/14/21 1527   irbesartan (AVAPRO) tablet 300 mg  300 mg Oral Daily Blake Divine, MD   300 mg at 12/14/21 1528   [START ON 12/15/2021] levothyroxine (SYNTHROID) tablet 75 mcg  75 mcg Oral QAC breakfast Blake Divine, MD       lidocaine (LIDODERM) 5 % 1 patch  1 patch Transdermal Q24H Blake Divine, MD   1 patch at 12/14/21 1523   magnesium  oxide (MAG-OX) tablet 400 mg  400 mg Oral Daily Blake Divine, MD   400 mg at 12/14/21 1527   metoprolol succinate (TOPROL-XL) 24 hr tablet 50 mg  50 mg Oral BID Blake Divine, MD   50 mg at 12/14/21 1526   potassium chloride SA (KLOR-CON M) CR tablet 20 mEq  20 mEq Oral BID Blake Divine, MD   20 mEq at 12/14/21 1527   vitamin B-12 (CYANOCOBALAMIN) tablet 100 mcg  100 mcg Oral BID Blake Divine, MD   100 mcg at 12/14/21 1529   Current Outpatient Medications  Medication Sig Dispense Refill   acetaminophen (TYLENOL) 500 MG tablet Take 500-1,000 mg by mouth every 6 (six) hours as needed for moderate pain.     alum & mag hydroxide-simeth (MAALOX/MYLANTA) 200-200-20 MG/5ML suspension Take 30 mLs by mouth every 6 (six) hours as needed for indigestion or heartburn.     Alum Hydroxide-Mag Trisilicate (GAVISCON) 66-44.0 MG CHEW Chew 1 tablet by mouth daily as needed (gas).     ARTIFICIAL TEAR SOLUTION OP Place 1 drop into both eyes daily as needed (dry eyes).     aspirin 81 MG EC tablet Take 1 tablet (81 mg total) by mouth daily. RESTART 48HRS AFTER DISCHARGE 30 tablet 12   atorvastatin (LIPITOR) 80 MG tablet Take 80 mg by mouth daily.     FLUoxetine (PROZAC) 10 MG capsule Take 10 mg by mouth daily.     fluticasone (FLONASE) 50 MCG/ACT nasal spray Place 1 spray into both nostrils daily as needed for allergies or rhinitis.     hydrALAZINE (APRESOLINE) 25 MG tablet Take 25 mg by mouth 3 (three) times daily.     levothyroxine (SYNTHROID) 75 MCG tablet Take 75 mcg by mouth daily before breakfast.     magnesium oxide (MAG-OX) 400 MG tablet Take 400 mg by mouth daily.     meclizine (ANTIVERT) 12.5 MG tablet Take 12.5 mg by mouth 3 (three) times daily as needed for dizziness.     metoprolol succinate (TOPROL-XL) 50 MG 24 hr tablet Take 50 mg by mouth 2 (two) times daily.     Multiple Vitamins-Minerals (THERA-M) TABS Take 1 tablet by mouth daily.     pantoprazole (PROTONIX) 40 MG tablet Take 1 tablet  (40 mg total) by mouth daily. 30 tablet 1   potassium chloride SA (KLOR-CON) 20 MEQ tablet Take 20 mEq by mouth 2 (two) times daily.     telmisartan (MICARDIS) 80 MG tablet Take 80 mg by mouth daily.     traMADol (ULTRAM) 50 MG tablet Take 1 tablet (50 mg total) by mouth every 6 (six) hours as needed for up to 10 doses. 10 tablet 0   trolamine salicylate (ASPERCREME) 10 % cream Apply 1 application topically as needed for muscle pain.     vitamin B-12 (CYANOCOBALAMIN) 100 MCG tablet Take 100 mg by mouth 2 (two) times daily.  ondansetron (ZOFRAN ODT) 4 MG disintegrating tablet Take 1 tablet (4 mg total) by mouth every 8 (eight) hours as needed for nausea or vomiting. (Patient not taking: Reported on 12/14/2021) 20 tablet 0     Discharge Medications: Please see discharge summary for a list of discharge medications.  Relevant Imaging Results:  Relevant Lab Results:   Additional Information SS# [id number]  Shelbie Hutching, RN

## 2021-12-14 NOTE — ED Notes (Signed)
PT in with pt to evaluate to go home  Family at bedside

## 2021-12-14 NOTE — ED Notes (Signed)
Physical therapy at bedside

## 2021-12-15 MED ORDER — LORAZEPAM 0.5 MG PO TABS
0.5000 mg | ORAL_TABLET | Freq: Once | ORAL | Status: AC
  Administered 2021-12-15: 0.5 mg via ORAL
  Filled 2021-12-15: qty 1

## 2021-12-15 NOTE — ED Notes (Signed)
Report given to [facility] at [facility].

## 2021-12-15 NOTE — ED Notes (Signed)
Pt observed eating her lunch, no distress noted,cont to monitor

## 2021-12-15 NOTE — ED Notes (Signed)
Pt reported to this writer that she is supposed to have a Post Op appointment to have drain removed from recent mastectomy.  Will attempt to call office.

## 2021-12-15 NOTE — ED Notes (Signed)
Spoke w/ Dr. Lysle Pearl and he is planning to come to bedside and remove the drain.  Pt and son made aware.

## 2021-12-15 NOTE — TOC Transition Note (Signed)
Transition of Care [facility]) - CM/SW Discharge Note   Patient Details  Name: Cindy Huerta MRN: [id number] Date of Birth: [birth year]  [facility]) CM/SW Contact:  Cindy Hutching, RN Phone Number: 12/15/2021, 3:06 PM   Clinical Narrative:    Patient discharging to [facility]hab today going to room 504 A.  Bedside RN will call report to [phone] and ED secretary will arrange EMS transport.  Patient's son notified of discharge today.   Insurance authorization approved from today through 12/17/21.  Pasrr #- [id number]   Final next level of care: [place] Barriers to Discharge: Barriers Resolved   Patient Goals and CMS Choice Patient states their goals for this hospitalization and ongoing recovery are:: Going to SNF [facility] Medicare.gov Compare Post Acute Care list provided to:: Patient Represenative (must comment) Choice offered to / list presented to : Adult Children  Discharge Placement PASRR number recieved: 12/15/21            Patient [facility] Patient to be transferred to facility by: [facility] Name of family member notified: Cindy Huerta (son) Patient and family notified of of transfer: 12/15/21  Discharge Plan and Services   Discharge Planning Services: CM Consult            DME Arranged: N/A DME Agency: NA       HH Arranged: NA [place] Agency: NA        Social Determinants of Health ([place]) Interventions     Readmission Risk Interventions Readmission Risk Prevention Plan 11/25/2021 10/10/2021 10/09/2021  Transportation Screening Complete Complete Complete  PCP or Specialist Appt within 5-7 Days - Complete -  PCP or Specialist Appt within 3-5 Days Complete - -  Home Care Screening - Complete -  Medication Review (RN CM) - Complete Complete  HRI or Home Care Consult Complete - -  Social Work Consult for [place] Planning/Counseling Complete - -  Palliative Care Screening Not Applicable - -   Medication Review (RN Care Manager) Complete - -  Some recent data might be hidden

## 2021-12-15 NOTE — ED Notes (Signed)
Pt was able to take all medications w/ applesauce.

## 2021-12-15 NOTE — ED Notes (Signed)
Dr Lysle Pearl and team at bedside.

## 2021-12-15 NOTE — ED Notes (Signed)
Pt assisted to bathroom with 2 standby assists.

## 2021-12-15 NOTE — ED Provider Notes (Signed)
Emergency Medicine Observation Re-evaluation Note  Cindy Huerta is a 76 y.o. female, seen on rounds today.  Pt initially presented to the ED for complaints of Knee Pain Currently, the patient is talking on the phone with family members, she is in no distress  Physical Exam  BP (!) 155/77    Pulse 77    Temp 98.1 F (36.7 C) (Oral)    Resp 18    Ht 1.676 m (5\' 6" )    Wt 78 kg    SpO2 98%    BMI 27.75 kg/m  Physical Exam General: No acute distress Cardiac: Regular rate and rhythm Lungs: Normal respiratory effort   ED Course / MDM    No new labs  Plan  Current plan is for social work/case management placement  No change in medical condition.      Lavonia Drafts, MD 12/15/21 [phone]

## 2021-12-15 NOTE — ED Notes (Signed)
Pt states that she is uncomfortable at her surgical site, pt reassurred that the dr had ordered something for her to relax, pt also informed that there was a pain patch ordered for her right knee. Pt asked if her pain is on the top side of her knee or the back side and pt states the back side, pt informed that she didn't have to move her leg at all that this RN could put the patch on her knee without causing her pain with movement. Pt then jerks the blanket forcefully and placed it back over her right leg then proceeds to call her family on her cell phone, stating that she needed the dr on call that she needed to get out of here, pt's family member heart reassuring the pt to calm down

## 2021-12-15 NOTE — ED Notes (Signed)
Pt provided an icepack for her right knee

## 2021-12-15 NOTE — TOC Progression Note (Signed)
Transition of Care [facility]) - Progression Note    Patient Details  Name: Cindy Huerta MRN: [id number] Date of Birth: 24-Mar-1946  Transition of Care [facility]) CM/SW Contact  Shelbie Hutching, RN Phone Number: 12/15/2021, 12:46 PM  Clinical Narrative:    Patient's son accepts bed offer from [facility].  Miquel Dunn can accept today if insurance auth approved.  RNCM started insurance auth through [place] portal.     Expected Discharge Plan: [place] Barriers to Discharge: Barriers Unresolved (comment), SNF Pending bed offer  Expected Discharge Plan and Services Expected Discharge Plan: [place]   Discharge Planning Services: CM Consult   Living arrangements for the past 2 months: Single Family Home                 DME Arranged: N/A DME Agency: NA       HH Arranged: NA HH Agency: NA         Social Determinants of Health (SDOH) Interventions    Readmission Risk Interventions Readmission Risk Prevention Plan 11/25/2021 10/10/2021 10/09/2021  Transportation Screening Complete Complete Complete  PCP or Specialist Appt within 5-7 Days - Complete -  PCP or Specialist Appt within 3-5 Days Complete - -  Home Care Screening - Complete -  Medication Review (RN CM) - Complete Complete  HRI or Home Care Consult Complete - -  Social Work Consult for [place] Planning/Counseling Complete - -  Palliative Care Screening Not Applicable - -  Medication Review (RN Care Manager) Complete - -  Some recent data might be hidden

## 2021-12-15 NOTE — ED Notes (Signed)
Pt transported by [facility] to [facility]. Pt belongings sent with pt. Pt son [name] also notified.

## 2021-12-15 NOTE — Progress Notes (Signed)
Physical Therapy Treatment Patient Details Name: Cindy Huerta MRN: [id number] DOB: 12/05/1946 Today's Date: 12/15/2021   History of Present Illness Cindy Huerta is a 76 y.o. female with past medical history of hypertension, hyperlipidemia, TIA, and breast cancer who underwent Left breast mastectomy wound reexploration and hematoma evacuation back in December.  Pt is currently in ED complaining of R knee pain after having 2 falls on Friday.    PT Comments    Patient is agreeable to PT. She reports she has less pain in her right knee area with movement today than yesterday. Patient was able to stand x 2 bouts today with assistance with increase in standing tolerance to ~ 20 seconds. Patient unable to stand [place] enough for standing blood pressure reading but denies dizziness with upright activity.  Recommend to continue PT to maximize independence and decrease caregiver burden. SNF is recommended at discharge for short term rehab prior to return home.    Recommendations for follow up therapy are one component of a multi-disciplinary discharge planning process, led by the attending physician.  Recommendations may be updated based on patient status, additional functional criteria and insurance authorization.  Follow Up Recommendations  Skilled nursing-short term rehab (<3 hours/day)     Assistance Recommended at Discharge Frequent or constant Supervision/Assistance  Patient can return home with the following A little help with walking and/or transfers;Help with stairs or ramp for entrance   Equipment Recommendations  Rolling walker (2 wheels);BSC/3in1    Recommendations for Other Services       Precautions / Restrictions Precautions Precautions: Fall Restrictions Weight Bearing Restrictions: No     Mobility  Bed Mobility Overal bed mobility: Needs Assistance Bed Mobility: Supine to Sit;Sit to Supine     Supine to sit: Supervision;HOB elevated Sit to supine: Min guard;HOB  elevated   General bed mobility comments: verbal cues for technique and sequencing. increased time and effort with return to bed due to mild right leg pain with movement    Transfers Overall transfer level: Needs assistance Equipment used: None Transfers: Sit to/from Stand Sit to Stand: Min guard           General transfer comment: 2 bouts of standing performed from bed. verbal cues for technique and task initiation. unable to stand Ch[place] enough to take standing blood pressure but standing tolerance increased to ~ 20 seconds today. mild right posterior thigh pain reported with activity    Ambulation/Gait               General Gait Details: limited standing tolerance for progression of ambulation   Stairs             Wheelchair Mobility    Modified Rankin (Stroke Patients Only)       Balance Overall balance assessment: Needs assistance Sitting-balance support: No upper extremity supported;Feet supported Sitting balance-Leahy Scale: Good     Standing balance support: Bilateral upper extremity supported Standing balance-Leahy Scale: Poor Standing balance comment: minimal assistance for safety with standing                            Cognition Arousal/Alertness: Awake/alert Behavior During Therapy: WFL for tasks assessed/performed Overall Cognitive Status: Within Functional Limits for tasks assessed                                 General Comments: patient is able to follow  single step commands with extra time. history of anxiety during therapy sessions, however patient did not demonstrate anxiety during this session. she needs cues for attention to task and does better with reassurance from therapist with mobility        Exercises General Exercises - Lower Extremity Ankle Circles/Pumps: AROM;Strengthening;Both;10 reps;Seated [place] Arc Quad: AROM;Strengthening;Both;10 reps;Seated Other Exercises Other Exercises: cues for  exercise technique    General Comments General comments (skin integrity, edema, etc.): blood pressure taken in supine- 175/72, and sitting 156/80. unable to stand Carpino enough for standing blood pressure reading. no dizziness is reported with activity      Pertinent Vitals/Pain Pain Assessment: Faces Faces Pain Scale: Hurts a little bit (with movement, no pain at rest) Pain Location: R knee with movement Pain Descriptors / Indicators:  (pulling) Pain Intervention(s): Limited activity within patient's tolerance;Monitored during session    Home Living                          Prior Function            PT Goals (current goals can now be found in the care plan section) Acute Rehab PT Goals Patient Stated Goal: to be independent PT Goal Formulation: With patient Potential to Achieve Goals: Fair Progress towards PT goals: Progressing toward goals    Frequency    Min 2X/week      PT Plan Current plan remains appropriate    Co-evaluation              AM-PAC PT "6 Clicks" Mobility   Outcome Measure  Help needed turning from your back to your side while in a flat bed without using bedrails?: A Little Help needed moving from lying on your back to sitting on the side of a flat bed without using bedrails?: A Little Help needed moving to and from a bed to a chair (including a wheelchair)?: A Little Help needed standing up from a chair using your arms (e.g., wheelchair or bedside chair)?: A Little Help needed to walk in hospital room?: Total Help needed climbing 3-5 steps with a railing? : Total 6 Click Score: 14    End of Session   Activity Tolerance: Patient tolerated treatment well Patient left: in bed;with call bell/phone within reach;with bed alarm set (set-up with lunch tray) Nurse Communication: Mobility status PT Visit Diagnosis: Muscle weakness (generalized) (M62.81);Difficulty in walking, not elsewhere classified (R26.2)     Time: 2426-8341 PT  Time Calculation (min) (ACUTE ONLY): 38 min  Charges:  $Therapeutic Exercise: 8-22 mins $Therapeutic Activity: 23-37 mins                     Minna Merritts, PT, MPT    Percell Locus 12/15/2021, 12:54 PM

## 2021-12-15 NOTE — ED Notes (Signed)
Ed tech answering the pt's call bell, ed tech to the ER dr and to this RN that the pt appears anxious and looks like she is about to panic, order placed by er dr

## 2021-12-15 NOTE — ED Notes (Signed)
Pt was taken to restroom via wheelchair.  Pt is 2 staff/moderate assist to stand and pivot.

## 2021-12-15 NOTE — ED Notes (Addendum)
Care manager called this RN to inform staff that the pt has been accepted at [facility] room [phone]  [phone]

## 2021-12-15 NOTE — ED Notes (Signed)
Pt rang on the call bell and states that she needs to have a bm, pt asked what she is being seen for in the hospital this visit and pt reports that she recently had left breast surg and showed this rn her scar, states that sat she fell and injured her right knee, pt states that she used the bedpan earlier today and used the bedside commode. Pt acting anxious while speaking to this RN and pausing frequently between words and reaching frequently to touch and grab this RN while talking, pt informed that she could do whichever was more comfortable for her and that this RN would assist her, pt states that she wanted to see how much she could do and didn't want to pull on this RN too much. Pt able to walk to the toilet and with assistance sat down on the toilet. After sitting for just a moment pt started to become more anxious and states that she feels lightheaded. This RN called out for additional help in case pt's lightheadedness got worse. Pt assisted to stand by this RN and another tech, this RN cleaned the pt's bottom and her shorts pulled up for pt. Pt able to walk to the bed with the assistance of the tech. Pt requiring more help to move her leg onto the bed than she needed initially. Pt asking the tech to push her bottom over onto the bed more rather than moving her hips over. Pt assisted up in the bed and this RN attempted to show the pt the location of the bed controls and pt growing frustrated with this RN, pt shows this RN her fingers and states that they are numb, pt reassured that controls were located on both sides of the bed so she could use either side to adjust to her comfort

## 2022-01-13 ENCOUNTER — Emergency Department
Admission: EM | Admit: 2022-01-13 | Discharge: 2022-01-13 | Disposition: A | Discharge status: Home / Self Care | Payer: Medicare PPO | Attending: Emergency Medicine | Admitting: Emergency Medicine

## 2022-01-13 ENCOUNTER — Encounter: Payer: Self-pay | Admitting: Emergency Medicine

## 2022-01-13 ENCOUNTER — Other Ambulatory Visit: Payer: Self-pay

## 2022-01-13 ENCOUNTER — Emergency Department: Payer: Medicare PPO

## 2022-01-13 DIAGNOSIS — I10 Essential (primary) hypertension: Secondary | ICD-10-CM | POA: Insufficient documentation

## 2022-01-13 DIAGNOSIS — E876 Hypokalemia: Secondary | ICD-10-CM

## 2022-01-13 DIAGNOSIS — E871 Hypo-osmolality and hyponatremia: Secondary | ICD-10-CM | POA: Insufficient documentation

## 2022-01-13 DIAGNOSIS — E039 Hypothyroidism, unspecified: Secondary | ICD-10-CM | POA: Diagnosis not present

## 2022-01-13 DIAGNOSIS — Z853 Personal history of malignant neoplasm of breast: Secondary | ICD-10-CM | POA: Insufficient documentation

## 2022-01-13 DIAGNOSIS — R112 Nausea with vomiting, unspecified: Secondary | ICD-10-CM | POA: Diagnosis not present

## 2022-01-13 DIAGNOSIS — R519 Headache, unspecified: Secondary | ICD-10-CM | POA: Diagnosis present

## 2022-01-13 LAB — CBC
HCT: 34.4 % — ABNORMAL LOW (ref 36.0–46.0)
Hemoglobin: 11.3 g/dL — ABNORMAL LOW (ref 12.0–15.0)
MCH: 28.8 pg (ref 26.0–34.0)
MCHC: 32.8 g/dL (ref 30.0–36.0)
MCV: 87.5 fL (ref 80.0–100.0)
Platelets: 279 10*3/uL (ref 150–400)
RBC: 3.93 MIL/uL (ref 3.87–5.11)
RDW: 14.6 % (ref 11.5–15.5)
WBC: 11.8 10*3/uL — ABNORMAL HIGH (ref 4.0–10.5)
nRBC: 0 % (ref 0.0–0.2)

## 2022-01-13 LAB — BASIC METABOLIC PANEL
Anion gap: 10 (ref 5–15)
BUN: 9 mg/dL (ref 8–23)
CO2: 21 mmol/L — ABNORMAL LOW (ref 22–32)
Calcium: 9.3 mg/dL (ref 8.9–10.3)
Chloride: 97 mmol/L — ABNORMAL LOW (ref 98–111)
Creatinine, Ser: 0.91 mg/dL (ref 0.44–1.00)
GFR, Estimated: >60 mL/min (ref >60)
Glucose, Bld: 87 mg/dL (ref 70–99)
Potassium: 3 mmol/L — ABNORMAL LOW (ref 3.5–5.1)
Sodium: 128 mmol/L — ABNORMAL LOW (ref 135–145)

## 2022-01-13 LAB — TROPONIN I (HIGH SENSITIVITY)
Troponin I (High Sensitivity): 10 ng/L (ref <18)
Troponin I (High Sensitivity): 14 ng/L (ref <18)

## 2022-01-13 MED ORDER — SODIUM CHLORIDE 0.9 % IV BOLUS
1000.0000 mL | Freq: Once | INTRAVENOUS | Status: AC
  Administered 2022-01-13: 1000 mL via INTRAVENOUS

## 2022-01-13 MED ORDER — ACETAMINOPHEN 500 MG PO TABS
1000.0000 mg | ORAL_TABLET | Freq: Once | ORAL | Status: AC
  Administered 2022-01-13: 1000 mg via ORAL
  Filled 2022-01-13: qty 2

## 2022-01-13 MED ORDER — POTASSIUM CHLORIDE CRYS ER 20 MEQ PO TBCR
40.0000 meq | EXTENDED_RELEASE_TABLET | Freq: Once | ORAL | Status: AC
Start: 2022-01-13 — End: 2022-01-13
  Administered 2022-01-13: 40 meq via ORAL
  Filled 2022-01-13: qty 2

## 2022-01-13 MED ORDER — METOCLOPRAMIDE HCL 10 MG PO TABS
10.0000 mg | ORAL_TABLET | Freq: Once | ORAL | Status: AC
  Administered 2022-01-13: 10 mg via ORAL
  Filled 2022-01-13: qty 1

## 2022-01-13 MED ORDER — LORAZEPAM 0.5 MG PO TABS
0.5000 mg | ORAL_TABLET | Freq: Once | ORAL | Status: AC
  Administered 2022-01-13: 0.5 mg via ORAL
  Filled 2022-01-13: qty 1

## 2022-01-13 MED ORDER — METOCLOPRAMIDE HCL 10 MG PO TABS: 10.0000 mg | ORAL_TABLET | Freq: Three times a day (TID) | ORAL | 0 refills | Status: DC

## 2022-01-13 MED ORDER — IOHEXOL 350 MG/ML SOLN
75.0000 mL | Freq: Once | INTRAVENOUS | Status: AC | PRN
  Administered 2022-01-13: 75 mL via INTRAVENOUS
  Filled 2022-01-13: qty 75

## 2022-01-13 NOTE — ED Notes (Signed)
Pt c/o HA x2 days. Pt has been taking OTC Tylenol without relief. Pt currently has ice pack to back of head/neck. Pt tearful & shaky. Pt now c/o "feeling like something is sitting there" midline chest.  This RN attempted PIV access without success. Pt has hx L mastectomy, so L arm is restricted. Dr. Starleen Blue aware.

## 2022-01-13 NOTE — ED Notes (Signed)
Light green top tube sent to lab for troponin.

## 2022-01-13 NOTE — ED Triage Notes (Signed)
Pt comes into the ED via [facility] clinic for headache and hypertension.  Pt had her BP checked yesterday with a systolic over 557 and today at [facility] it was systolic in the 322'G.  PT does c/o headache and dizziness, but denies any chest pain or shob. PT presents anxious, but family member, this is not abnormal for her.

## 2022-01-13 NOTE — Discharge Instructions (Addendum)
Your CAT scans were normal today, and did not show any bleeding in your brain.  If your headache returns or you have any confusion or neurologic symptoms such as visual change numbness or weakness, please return to the emergency department as this could indicate bleeding in your brain that we did not see today on your CAT scan.  Your sodium and potassium were slightly low.  Please continue to take your potassium supplement and try to get good nutrition.  Please follow-up with your primary care doctor to have these repeated in about 1 week.  You can take Tylenol and the Reglan which I have prescribed for headache.  The Reglan can also be used for nausea.

## 2022-01-13 NOTE — ED Provider Notes (Signed)
[facility] Provider Note    Event Date/Time   First MD Initiated Contact with Patient 01/13/22 1834     (approximate)   History   Hypertension and Headache   HPI  Cindy Huerta is a 76 y.o. female with past medical history of breast cancer status postmastectomy, hypertension, hypothyroidism, hyperlipidemia, depression and anxiety who presents with headache and elevated blood pressure.  Patient is quite anxious on my initial evaluation has difficulty providing history.  Says that headache has been going on for the last day.  Has had headaches in the past unable to tell me if this is significantly different from prior headaches.  Has had some nausea and vomiting.  Denies visual change numbness tingling or weakness.  The patient's son Cindy Huerta who notes that patient went to the PCP office yesterday and initially was okay and then around 6 PM last night started having headache, which has persisted.  Patient's been checking her blood pressure multiple times and was elevated to the 885O systolic.  He tells me that patient does have periods of intense anxiety that is not unusual for her.  She is otherwise been acting like herself.  Normally she is able to walk with a cane.  Past Medical History:  Diagnosis Date   Arthritis    Breast cancer ([place])    Depression    GERD (gastroesophageal reflux disease)    HLD (hyperlipidemia)    Hypertension    Hypothyroidism     Patient Active Problem List   Diagnosis Date Noted   HTN (hypertension) 11/24/2021   Normocytic anemia 11/24/2021   Vertigo 11/24/2021   Breast CA ([place]) 11/22/2021   Breast cancer ([place]) 11/20/2021   Intractable nausea and vomiting 10/09/2021   Weakness 10/07/2021   Carcinoma of overlapping sites of left breast in female, estrogen receptor positive ([place]) 10/06/2021   Dizziness 10/06/2021   Rectal bleeding 10/06/2021   Nausea & vomiting 10/06/2021   GIB (gastrointestinal bleeding) 10/06/2021    Left-sided weakness 07/29/2021   HLD (hyperlipidemia) 07/29/2021   Hypothyroidism 07/29/2021   Left sided numbness 07/29/2021   Depression    CKD (chronic kidney disease), stage IIIa    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   Epigastric pain 04/23/2021   GERD (gastroesophageal reflux disease) 04/23/2021   Benign essential hypertension 01/20/2021   History of breast cancer 01/20/2021   History of TIA (transient ischemic attack) 01/20/2021   Palpitations 01/20/2021   Malignant neoplasm of upper-outer quadrant of left female breast ([place]) 12/06/2018     Physical Exam  Triage Vital Signs: ED Triage Vitals  Enc Vitals Group     BP 01/13/22 1655 (!) 175/91     Pulse Rate 01/13/22 1655 79     Resp 01/13/22 1655 (!) 22     Temp 01/13/22 1655 98.7 F (37.1 C)     Temp Source 01/13/22 1655 Oral     SpO2 01/13/22 1655 97 %     Weight 01/13/22 1657 171 lb 15.3 oz (78 kg)     Height 01/13/22 1657 5\' 6"  (1.676 m)     Head Circumference --      Peak Flow --      Pain Score 01/13/22 1656 10     Pain Loc --      Pain Edu? --      Excl. in [place]? --     Most recent vital signs: Vitals:   01/13/22 1655 01/13/22 2253  BP: (!) 175/91 (!) 177/70  Pulse: 79  81  Resp: (!) 22 17  Temp: 98.7 F (37.1 C)   SpO2: 97% 99%     General: Awake, patient is hyperventilating, appears very anxious CV:  Good peripheral perfusion.  Resp:  Normal effort.  Abd:  No distention.  Neuro:             Awake, Alert, Oriented x 3   Aox3, nml speech  PERRL, EOMI, face symmetric, nml tongue movement  5/5 strength in the BL upper and lower extremities  Sensation grossly intact in the BL upper and lower extremities  Finger-nose-finger intact BL  Other:  Extremely anxious   ED Results / Procedures / Treatments  Labs (all labs ordered are listed, but only abnormal results are displayed) Labs Reviewed  BASIC METABOLIC PANEL - Abnormal; Notable for the following components:      Result Value   Sodium 128 (*)     Potassium 3.0 (*)    Chloride 97 (*)    CO2 21 (*)    All other components within normal limits  CBC - Abnormal; Notable for the following components:   WBC 11.8 (*)    Hemoglobin 11.3 (*)    HCT 34.4 (*)    All other components within normal limits  TROPONIN I (HIGH SENSITIVITY)  TROPONIN I (HIGH SENSITIVITY)     EKG  EKG interpretation performed by myself: NSR, nml axis, nml intervals, no acute ischemic changes    RADIOLOGY I reviewed the CT scan of the brain which does not show any acute intracranial process; agree with radiology report     PROCEDURES:  Critical Care performed: No  Procedures  The patient is on the cardiac monitor to evaluate for evidence of arrhythmia and/or significant heart rate changes.   MEDICATIONS ORDERED IN ED: Medications  LORazepam (ATIVAN) tablet 0.5 mg (0.5 mg Oral Given 01/13/22 1927)  acetaminophen (TYLENOL) tablet 1,000 mg (1,000 mg Oral Given 01/13/22 1927)  metoCLOPramide (REGLAN) tablet 10 mg (10 mg Oral Given 01/13/22 1927)  sodium chloride 0.9 % bolus 1,000 mL (1,000 mLs Intravenous New Bag/Given 01/13/22 2104)  potassium chloride SA (KLOR-CON M) CR tablet 40 mEq (40 mEq Oral Given 01/13/22 1928)  iohexol (OMNIPAQUE) 350 MG/ML injection 75 mL (75 mLs Intravenous Contrast Given 01/13/22 2124)     IMPRESSION / MDM / ASSESSMENT AND PLAN / ED COURSE  I reviewed the triage vital signs and the nursing notes.                              Differential diagnosis includes, but is not limited to, intracranial hemorrhage, CVA, subarachnoid hemorrhage, temporal arteritis, migraine headache  Patient is a 76 year old female who presents with headache and elevated blood pressure.  Patient is extremely anxious which makes taking history somewhat difficult.  Seems like the headache has been going on for the last day and a half and patient does have a history of headaches.  Does not usually have to come to the ED for them however.  Patient's son says that  she is not altered and this anxiety is not unusual for her.  Patient is neurologically intact without dysmetria and has normal strength.  She is somewhat unsteady with walking however.  I reviewed the patient's labs which are notable for mild hyponatremia to 128 and hypokalemia to potassium of 3.  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  I am to treat with small dose of p.o. Ativan and migraine cocktail and reassess.   Patient CT head does not show any acute abnormality.  After p.o. Ativan, Tylenol, Reglan and fluids patient headache much improved.  Patient was able to walk to the bathroom unassisted and is much more steady now.  I obtained a CTA to further restratify for subarachnoid hemorrhage, we cannot rule this out with plain CT given her pain is been over 6 hours.  CTA does not show any aneurysm or vascular malformation.  On reassessment patient again feeling better and would like to go home.  I discussed with her son that we have not ruled out subarachnoid hemorrhage and there is still a small chance that she could have this diagnosis would need to perform a lumbar puncture to know definitively.  We both agreed that given she is feeling better that we would not pursue this at this time.  Did discuss return precautions for worsening headache, altered mental status, neurologic symptoms or vomiting.  Patient's son understood and she will be going home to his care.  Ultimately given her clinical improvement I think she is appropriate for discharge.  Patient has a potassium supplement that she takes at home already.  Recommended that they have her electrolytes repeated in about 1 week.     FINAL CLINICAL IMPRESSION(S) / ED DIAGNOSES   Final diagnoses:  Nonintractable headache, unspecified chronicity pattern, unspecified headache type  Hyponatremia  Hypokalemia     Rx  / DC Orders   ED Discharge Orders          Ordered    metoCLOPramide (REGLAN) 10 MG tablet  3 times daily with meals        01/13/22 2301             Note:  This document was prepared using Dragon voice recognition software and may include unintentional dictation errors.   Rada Hay, MD 01/13/22 [phone]

## 2022-06-18 ENCOUNTER — Inpatient Hospital Stay
Admission: EM | Admit: 2022-06-18 | Discharge: 2022-06-20 | DRG: 641 | Disposition: A | Discharge status: Home Health | Payer: Medicare PPO | Attending: Internal Medicine | Admitting: Internal Medicine

## 2022-06-18 ENCOUNTER — Emergency Department: Payer: Medicare PPO

## 2022-06-18 ENCOUNTER — Other Ambulatory Visit: Payer: Self-pay

## 2022-06-18 DIAGNOSIS — I130 Hypertensive heart and chronic kidney disease with heart failure and stage 1 through stage 4 chronic kidney disease, or unspecified chronic kidney disease: Secondary | ICD-10-CM | POA: Diagnosis present

## 2022-06-18 DIAGNOSIS — I5032 Chronic diastolic (congestive) heart failure: Secondary | ICD-10-CM | POA: Diagnosis not present

## 2022-06-18 DIAGNOSIS — F32A Depression, unspecified: Secondary | ICD-10-CM | POA: Diagnosis present

## 2022-06-18 DIAGNOSIS — Z8249 Family history of ischemic heart disease and other diseases of the circulatory system: Secondary | ICD-10-CM

## 2022-06-18 DIAGNOSIS — M1990 Unspecified osteoarthritis, unspecified site: Secondary | ICD-10-CM | POA: Diagnosis present

## 2022-06-18 DIAGNOSIS — E876 Hypokalemia: Secondary | ICD-10-CM

## 2022-06-18 DIAGNOSIS — Z881 Allergy status to other antibiotic agents status: Secondary | ICD-10-CM

## 2022-06-18 DIAGNOSIS — Z66 Do not resuscitate: Secondary | ICD-10-CM | POA: Diagnosis present

## 2022-06-18 DIAGNOSIS — K219 Gastro-esophageal reflux disease without esophagitis: Secondary | ICD-10-CM | POA: Diagnosis present

## 2022-06-18 DIAGNOSIS — I10 Essential (primary) hypertension: Secondary | ICD-10-CM | POA: Diagnosis present

## 2022-06-18 DIAGNOSIS — R531 Weakness: Secondary | ICD-10-CM

## 2022-06-18 DIAGNOSIS — E871 Hypo-osmolality and hyponatremia: Secondary | ICD-10-CM | POA: Diagnosis not present

## 2022-06-18 DIAGNOSIS — Z79899 Other long term (current) drug therapy: Secondary | ICD-10-CM

## 2022-06-18 DIAGNOSIS — F418 Other specified anxiety disorders: Secondary | ICD-10-CM | POA: Diagnosis present

## 2022-06-18 DIAGNOSIS — Z853 Personal history of malignant neoplasm of breast: Secondary | ICD-10-CM

## 2022-06-18 DIAGNOSIS — N1831 Chronic kidney disease, stage 3a: Secondary | ICD-10-CM | POA: Diagnosis present

## 2022-06-18 DIAGNOSIS — Z8673 Personal history of transient ischemic attack (TIA), and cerebral infarction without residual deficits: Secondary | ICD-10-CM

## 2022-06-18 DIAGNOSIS — E785 Hyperlipidemia, unspecified: Secondary | ICD-10-CM | POA: Diagnosis present

## 2022-06-18 DIAGNOSIS — Z91040 Latex allergy status: Secondary | ICD-10-CM

## 2022-06-18 DIAGNOSIS — E039 Hypothyroidism, unspecified: Secondary | ICD-10-CM | POA: Diagnosis present

## 2022-06-18 DIAGNOSIS — N183 Chronic kidney disease, stage 3 unspecified: Secondary | ICD-10-CM | POA: Diagnosis present

## 2022-06-18 DIAGNOSIS — Z809 Family history of malignant neoplasm, unspecified: Secondary | ICD-10-CM

## 2022-06-18 DIAGNOSIS — R300 Dysuria: Secondary | ICD-10-CM | POA: Diagnosis not present

## 2022-06-18 DIAGNOSIS — Z888 Allergy status to other drugs, medicaments and biological substances status: Secondary | ICD-10-CM

## 2022-06-18 DIAGNOSIS — Z885 Allergy status to narcotic agent status: Secondary | ICD-10-CM

## 2022-06-18 DIAGNOSIS — Z884 Allergy status to anesthetic agent status: Secondary | ICD-10-CM

## 2022-06-18 DIAGNOSIS — F411 Generalized anxiety disorder: Secondary | ICD-10-CM | POA: Diagnosis present

## 2022-06-18 HISTORY — DX: Hypokalemia: E87.6

## 2022-06-18 HISTORY — DX: Hypo-osmolality and hyponatremia: E87.1

## 2022-06-18 LAB — URINALYSIS, ROUTINE W REFLEX MICROSCOPIC
Bilirubin Urine: NEGATIVE
Glucose, UA: NEGATIVE mg/dL
Hgb urine dipstick: NEGATIVE
Ketones, ur: NEGATIVE mg/dL
Leukocytes,Ua: NEGATIVE
Nitrite: NEGATIVE
Protein, ur: NEGATIVE mg/dL
Specific Gravity, Urine: 1.006 (ref 1.005–1.030)
pH: 8 (ref 5.0–8.0)

## 2022-06-18 LAB — BASIC METABOLIC PANEL
Anion gap: 12 (ref 5–15)
BUN: 15 mg/dL (ref 8–23)
CO2: 25 mmol/L (ref 22–32)
Calcium: 9.8 mg/dL (ref 8.9–10.3)
Chloride: 92 mmol/L — ABNORMAL LOW (ref 98–111)
Creatinine, Ser: 1.13 mg/dL — ABNORMAL HIGH (ref 0.44–1.00)
GFR, Estimated: 50 mL/min — ABNORMAL LOW (ref >60)
Glucose, Bld: 91 mg/dL (ref 70–99)
Potassium: 3.4 mmol/L — ABNORMAL LOW (ref 3.5–5.1)
Sodium: 129 mmol/L — ABNORMAL LOW (ref 135–145)

## 2022-06-18 LAB — COMPREHENSIVE METABOLIC PANEL
ALT: 18 U/L (ref 0–44)
AST: 30 U/L (ref 15–41)
Albumin: 4.5 g/dL (ref 3.5–5.0)
Alkaline Phosphatase: 55 U/L (ref 38–126)
Anion gap: 12 (ref 5–15)
BUN: 15 mg/dL (ref 8–23)
CO2: 25 mmol/L (ref 22–32)
Calcium: 9.9 mg/dL (ref 8.9–10.3)
Chloride: 88 mmol/L — ABNORMAL LOW (ref 98–111)
Creatinine, Ser: 1.07 mg/dL — ABNORMAL HIGH (ref 0.44–1.00)
GFR, Estimated: 53 mL/min — ABNORMAL LOW (ref >60)
Glucose, Bld: 88 mg/dL (ref 70–99)
Potassium: 3.4 mmol/L — ABNORMAL LOW (ref 3.5–5.1)
Sodium: 125 mmol/L — ABNORMAL LOW (ref 135–145)
Total Bilirubin: 0.8 mg/dL (ref 0.3–1.2)
Total Protein: 8.5 g/dL — ABNORMAL HIGH (ref 6.5–8.1)

## 2022-06-18 LAB — CBC
HCT: 35.9 % — ABNORMAL LOW (ref 36.0–46.0)
Hemoglobin: 12.4 g/dL (ref 12.0–15.0)
MCH: 30 pg (ref 26.0–34.0)
MCHC: 34.5 g/dL (ref 30.0–36.0)
MCV: 86.9 fL (ref 80.0–100.0)
Platelets: 220 10*3/uL (ref 150–400)
RBC: 4.13 MIL/uL (ref 3.87–5.11)
RDW: 12.5 % (ref 11.5–15.5)
WBC: 9.4 10*3/uL (ref 4.0–10.5)
nRBC: 0 % (ref 0.0–0.2)

## 2022-06-18 LAB — OSMOLALITY, URINE: Osmolality, Ur: 244 mOsm/kg — ABNORMAL LOW (ref 300–900)

## 2022-06-18 LAB — SODIUM, URINE, RANDOM: Sodium, Ur: 22 mmol/L

## 2022-06-18 LAB — BRAIN NATRIURETIC PEPTIDE: B Natriuretic Peptide: 70.5 pg/mL (ref 0.0–100.0)

## 2022-06-18 LAB — OSMOLALITY: Osmolality: 269 mOsm/kg — ABNORMAL LOW (ref 275–295)

## 2022-06-18 LAB — PHOSPHORUS: Phosphorus: 2.2 mg/dL — ABNORMAL LOW (ref 2.5–4.6)

## 2022-06-18 LAB — MAGNESIUM: Magnesium: 1.9 mg/dL (ref 1.7–2.4)

## 2022-06-18 MED ORDER — LORAZEPAM 0.5 MG PO TABS
0.5000 mg | ORAL_TABLET | Freq: Three times a day (TID) | ORAL | Status: DC | PRN
  Administered 2022-06-19 – 2022-06-20 (×3): 0.5 mg via ORAL
  Filled 2022-06-18 (×4): qty 1

## 2022-06-18 MED ORDER — MAGNESIUM OXIDE 400 MG PO TABS
400.0000 mg | ORAL_TABLET | Freq: Every day | ORAL | Status: DC
  Administered 2022-06-19 – 2022-06-20 (×2): 400 mg via ORAL
  Filled 2022-06-18 (×4): qty 1

## 2022-06-18 MED ORDER — ADULT MULTIVITAMIN W/MINERALS CH
1.0000 | ORAL_TABLET | Freq: Every day | ORAL | Status: DC
  Administered 2022-06-18 – 2022-06-20 (×3): 1 via ORAL
  Filled 2022-06-18 (×3): qty 1

## 2022-06-18 MED ORDER — VITAMIN B-12 1000 MCG PO TABS
1000.0000 ug | ORAL_TABLET | Freq: Every day | ORAL | Status: DC
  Administered 2022-06-19 – 2022-06-20 (×2): 1000 ug via ORAL
  Filled 2022-06-18 (×3): qty 1

## 2022-06-18 MED ORDER — VITAMIN B-12 100 MCG PO TABS: 100000.0000 ug | ORAL_TABLET | Freq: Two times a day (BID) | ORAL | Status: DC

## 2022-06-18 MED ORDER — ACETAMINOPHEN 325 MG PO TABS
650.0000 mg | ORAL_TABLET | Freq: Four times a day (QID) | ORAL | Status: DC | PRN
  Administered 2022-06-20: 650 mg via ORAL
  Filled 2022-06-18: qty 2

## 2022-06-18 MED ORDER — ONDANSETRON HCL 4 MG/2ML IJ SOLN: 4.0000 mg | Freq: Three times a day (TID) | INTRAMUSCULAR | Status: DC | PRN

## 2022-06-18 MED ORDER — METOPROLOL SUCCINATE ER 25 MG PO TB24
25.0000 mg | ORAL_TABLET | Freq: Two times a day (BID) | ORAL | Status: DC
  Administered 2022-06-18 – 2022-06-20 (×2): 25 mg via ORAL
  Filled 2022-06-18 (×4): qty 1

## 2022-06-18 MED ORDER — FLUOXETINE HCL 10 MG PO CAPS
10.0000 mg | ORAL_CAPSULE | Freq: Every day | ORAL | Status: DC
  Administered 2022-06-18 – 2022-06-20 (×3): 10 mg via ORAL
  Filled 2022-06-18 (×3): qty 1

## 2022-06-18 MED ORDER — SODIUM CHLORIDE 0.9 % IV SOLN: INTRAVENOUS | Status: DC

## 2022-06-18 MED ORDER — CLONIDINE HCL 0.1 MG PO TABS
0.1000 mg | ORAL_TABLET | Freq: Two times a day (BID) | ORAL | Status: DC
  Administered 2022-06-18 – 2022-06-20 (×2): 0.1 mg via ORAL
  Filled 2022-06-18 (×4): qty 1

## 2022-06-18 MED ORDER — HYDRALAZINE HCL 20 MG/ML IJ SOLN
5.0000 mg | INTRAMUSCULAR | Status: DC | PRN
  Administered 2022-06-20: 5 mg via INTRAVENOUS
  Filled 2022-06-18: qty 1

## 2022-06-18 MED ORDER — ALUM & MAG HYDROXIDE-SIMETH 200-200-20 MG/5ML PO SUSP: 30.0000 mL | Freq: Four times a day (QID) | ORAL | Status: DC | PRN

## 2022-06-18 MED ORDER — TRAMADOL HCL 50 MG PO TABS
50.0000 mg | ORAL_TABLET | Freq: Four times a day (QID) | ORAL | Status: DC | PRN
  Administered 2022-06-18: 50 mg via ORAL
  Filled 2022-06-18: qty 1

## 2022-06-18 MED ORDER — PHENAZOPYRIDINE HCL 100 MG PO TABS
100.0000 mg | ORAL_TABLET | Freq: Three times a day (TID) | ORAL | Status: DC
Start: 2022-06-18 — End: 2022-06-20
  Administered 2022-06-18 – 2022-06-20 (×5): 100 mg via ORAL
  Filled 2022-06-18 (×6): qty 1

## 2022-06-18 MED ORDER — ENOXAPARIN SODIUM 40 MG/0.4ML IJ SOSY
40.0000 mg | PREFILLED_SYRINGE | INTRAMUSCULAR | Status: DC
Start: 2022-06-18 — End: 2022-06-20
  Administered 2022-06-18 – 2022-06-19 (×2): 40 mg via SUBCUTANEOUS
  Filled 2022-06-18 (×2): qty 0.4

## 2022-06-18 MED ORDER — HYDRALAZINE HCL 25 MG PO TABS
25.0000 mg | ORAL_TABLET | Freq: Three times a day (TID) | ORAL | Status: DC
  Administered 2022-06-18 – 2022-06-20 (×5): 25 mg via ORAL
  Filled 2022-06-18 (×6): qty 1

## 2022-06-18 MED ORDER — PANTOPRAZOLE SODIUM 40 MG PO TBEC
40.0000 mg | DELAYED_RELEASE_TABLET | Freq: Every day | ORAL | Status: DC
  Administered 2022-06-19 – 2022-06-20 (×2): 40 mg via ORAL
  Filled 2022-06-18 (×2): qty 1

## 2022-06-18 MED ORDER — ASPIRIN 81 MG PO TBEC
81.0000 mg | DELAYED_RELEASE_TABLET | Freq: Every day | ORAL | Status: DC
  Administered 2022-06-19 – 2022-06-20 (×2): 81 mg via ORAL
  Filled 2022-06-18 (×2): qty 1

## 2022-06-18 MED ORDER — MECLIZINE HCL 25 MG PO TABS: 12.5000 mg | ORAL_TABLET | Freq: Three times a day (TID) | ORAL | Status: DC | PRN

## 2022-06-18 MED ORDER — LORAZEPAM 0.5 MG PO TABS
0.5000 mg | ORAL_TABLET | Freq: Once | ORAL | Status: AC
  Administered 2022-06-18: 0.5 mg via ORAL
  Filled 2022-06-18: qty 1

## 2022-06-18 MED ORDER — SODIUM CHLORIDE 0.9 % IV BOLUS
500.0000 mL | Freq: Once | INTRAVENOUS | Status: AC
  Administered 2022-06-18: 500 mL via INTRAVENOUS

## 2022-06-18 MED ORDER — FLUTICASONE PROPIONATE 50 MCG/ACT NA SUSP
1.0000 | Freq: Every day | NASAL | Status: DC | PRN
Start: 2022-06-18 — End: 2022-06-20

## 2022-06-18 MED ORDER — OXYBUTYNIN CHLORIDE 5 MG PO TABS
2.5000 mg | ORAL_TABLET | Freq: Two times a day (BID) | ORAL | Status: DC
  Administered 2022-06-19 – 2022-06-20 (×3): 2.5 mg via ORAL
  Filled 2022-06-18 (×4): qty 0.5

## 2022-06-18 MED ORDER — POTASSIUM CHLORIDE CRYS ER 20 MEQ PO TBCR
40.0000 meq | EXTENDED_RELEASE_TABLET | Freq: Once | ORAL | Status: AC
  Administered 2022-06-18: 40 meq via ORAL
  Filled 2022-06-18: qty 2

## 2022-06-18 MED ORDER — LEVOTHYROXINE SODIUM 50 MCG PO TABS
75.0000 ug | ORAL_TABLET | Freq: Every day | ORAL | Status: DC
  Administered 2022-06-19 – 2022-06-20 (×2): 75 ug via ORAL
  Filled 2022-06-18 (×2): qty 1

## 2022-06-18 MED ORDER — ATORVASTATIN CALCIUM 20 MG PO TABS
80.0000 mg | ORAL_TABLET | Freq: Every day | ORAL | Status: DC
  Administered 2022-06-19 – 2022-06-20 (×2): 80 mg via ORAL
  Filled 2022-06-18 (×2): qty 4

## 2022-06-18 MED ORDER — ONDANSETRON 4 MG PO TBDP
4.0000 mg | ORAL_TABLET | Freq: Once | ORAL | Status: AC
  Administered 2022-06-18: 4 mg via ORAL
  Filled 2022-06-18: qty 1

## 2022-06-18 NOTE — ED Notes (Signed)
Patient's pants, shirt and shoes are in a bag with the patient upon admission. Patient is wearing her hat and shirt upon admission.

## 2022-06-18 NOTE — Assessment & Plan Note (Signed)
Sodium 125.  Etiology is not clear.  Differential diagnosis include poor oral intake and dehydration, SIADH, thyroid dysfunction  - will place in tele bed for obs - Will check urine sodium, urine osmolality, serum osmolality. - Fluid restriction - hold Micardis - IVF:500 cc of NS, will continue with IV normal saline at 75 mL/h - f/u by BMP q8h - avoid over correction too fast due to risk of central pontine myelinolysis

## 2022-06-18 NOTE — Assessment & Plan Note (Signed)
Etiology is not clear.  May be due to bladder spasm.  Urinalysis negative.  CT scan for renal stone protocol is negative. -Follow-up urine culture -Start Pyridium and oxybutynin empirically

## 2022-06-18 NOTE — ED Triage Notes (Addendum)
Patient to ER via [facility] with complaints of painful urination and bladder pain that started this morning around 6am. Patient reports she has also be unable to urinate since 6am.

## 2022-06-18 NOTE — ED Notes (Addendum)
Patient breaks multi vitamin and potassium pills in two and takes in applesauce.

## 2022-06-18 NOTE — ED Provider Notes (Signed)
[facility] Provider Note    Event Date/Time   First MD Initiated Contact with Patient 06/18/22 1308     (approximate)   History   Dysuria   HPI  Cindy Huerta is a 76 y.o. female who has a history of breast cancer depression hypertension hypothyroidism  Patient reports last night she woke up she needed to urinate, she felt a burning stinging with urination and has been having a lot of urge to feel like she needs to urinate but feels like she cannot empty her bladder well.  Today at the clinic she developed some nausea and felt little lightheaded and vomited once.  Her son who is with her reports that the nausea and symptoms that accompany are actually felt to be part of some significant anxiety that she is developed over the last several years and that for her to get lightheaded and nauseated and seeking medical care has not been unusual.  She denies headache no chest pain no trouble breathing.  Denies abdominal pain except for pain low in her abdomen with the urge to urinate.  No back pain no fevers  She denies any history of urinary blockages in the past and does not have a history of frequent urinary infection  No diarrhea  Patient has been experiencing feeling of weakness and generalized fatigue.  No headaches.  No confusion.     Physical Exam   Triage Vital Signs: ED Triage Vitals  Enc Vitals Group     BP 06/18/22 1159 (!) 173/77     Pulse Rate 06/18/22 1159 (!) 49     Resp 06/18/22 1159 18     Temp 06/18/22 1159 97.7 F (36.5 C)     Temp Source 06/18/22 1159 Oral     SpO2 06/18/22 1159 100 %     Weight 06/18/22 1320 171 lb 15.3 oz (78 kg)     Height 06/18/22 1201 '5\' 6"'$  (1.676 m)     Head Circumference --      Peak Flow --      Pain Score --      Pain Loc --      Pain Edu? --      Excl. in [place]? --     Most recent vital signs: Vitals:   06/18/22 1159 06/18/22 1600  BP: (!) 173/77 (!) 172/81  Pulse: (!) 49 (!) 53  Resp: 18 16   Temp: 97.7 F (36.5 C)   SpO2: 100% 100%     General: Awake, no distress.  She does however appear quite anxious.  Her son relates that she gets quite anxious when seeking medical care CV:  Good peripheral perfusion.  Normal heart tones Resp:  Normal effort.  Clear speaks full sentences Abd:  No distention.  Reproducible pain discomfort in the suprapubic region only.  No pain across the upper abdomen epigastrium or flanks bilaterally. Other:    Nurse reports postvoid residual of just greater than 200 mL.  Prior to voiding bladder scan showed about 350 mL.   ED Results / Procedures / Treatments   Labs (all labs ordered are listed, but only abnormal results are displayed) Labs Reviewed  URINALYSIS, ROUTINE W REFLEX MICROSCOPIC - Abnormal; Notable for the following components:      Result Value   Color, Urine YELLOW (*)    APPearance CLEAR (*)    All other components within normal limits  CBC - Abnormal; Notable for the following components:   HCT 35.9 (*)  All other components within normal limits  COMPREHENSIVE METABOLIC PANEL - Abnormal; Notable for the following components:   Sodium 125 (*)    Potassium 3.4 (*)    Chloride 88 (*)    Creatinine, Ser 1.07 (*)    Total Protein 8.5 (*)    GFR, Estimated 53 (*)    All other components within normal limits  URINE CULTURE  OSMOLALITY  OSMOLALITY, URINE  SODIUM, URINE, RANDOM  BASIC METABOLIC PANEL  BASIC METABOLIC PANEL     EKG  Interpreted by me at 1620 heart rate 50 QRS 80 QTc 470 normal sinus rhythm, mild ST segment depression noted in the lateral precordial leads.  No STEMI   RADIOLOGY   I personally interpreted the patient's CT abdomen pelvis for gross pathology, I do not see any evidence of acute obstructive uropathy  CT Renal Stone Study  Result Date: 06/18/2022 CLINICAL DATA:  Acute onset bladder pain and dysuria this morning. EXAM: CT ABDOMEN AND PELVIS WITHOUT CONTRAST TECHNIQUE: Multidetector CT  imaging of the abdomen and pelvis was performed following the standard protocol without IV contrast. RADIATION DOSE REDUCTION: This exam was performed according to the departmental dose-optimization program which includes automated exposure control, adjustment of the mA and/or kV according to patient size and/or use of iterative reconstruction technique. COMPARISON:  10/06/2021 FINDINGS: Lower chest: No acute findings. Hepatobiliary: No mass visualized on this unenhanced exam. Prior cholecystectomy. No evidence of biliary obstruction. Pancreas: No mass or inflammatory process visualized on this unenhanced exam. Spleen:  Within normal limits in size. Adrenals/Urinary tract: No evidence of urolithiasis or hydronephrosis. Unopacified urinary bladder is nondilated and unremarkable in appearance. Stomach/Bowel: No evidence of obstruction, inflammatory process, or abnormal fluid collections. Diverticulosis is seen mainly involving the descending and sigmoid colon, however there is no evidence of diverticulitis. Vascular/Lymphatic: No pathologically enlarged lymph nodes identified. No evidence of abdominal aortic aneurysm. Aortic atherosclerotic calcification incidentally noted. Reproductive: Prior hysterectomy noted. Adnexal regions are unremarkable in appearance. Other:  None. Musculoskeletal:  No suspicious bone lesions identified. IMPRESSION: No evidence of urolithiasis, hydronephrosis, or other acute findings. Colonic diverticulosis, without radiographic evidence of diverticulitis. Electronically Signed   By: Marlaine Hind M.D.   On: 06/18/2022 15:32    CT imaging negative for acute findings.    PROCEDURES:  Critical Care performed: No  Procedures   MEDICATIONS ORDERED IN ED: Medications  ondansetron (ZOFRAN-ODT) disintegrating tablet 4 mg (4 mg Oral Given 06/18/22 1533)  LORazepam (ATIVAN) tablet 0.5 mg (0.5 mg Oral Given 06/18/22 1535)     IMPRESSION / MDM / ASSESSMENT AND PLAN / ED COURSE  I  reviewed the triage vital signs and the nursing notes.                              Differential diagnosis includes, but is not limited to, urinary tract infection, ureteral obstruction, acute kidney stone, pyelonephritis, diverticulitis, bowel obstruction, gastritis or other acute intra-abdominal cause referring pain to her suprapubic region with urinary symptoms.  Patient also noted to be anxious reports generalized fatigue as well.  On review of labs and markedly diminished sodium at 125 could also be causing some of her anxiety and also generalized feeling of fatigue and weakness.  She does not show evidence of acute confusion, but does show some evidence of anxiety and reports some nausea though this per her son has been attributed to anxiety in the past I suppose it technically could be  related to her sodium level as well.  Review of prior records indicates some element of hyponatremia but never the level of 125.  She appears euvolemic on exam  After ED work-up, no clear evidence to support cause for her dysuria and urgency.  Urine sent for culture.  Discussed with patient, given the degree of the low sodium and her feeling of fatigue we will admit to the hospitalist service for further work-up as to cause for hyponatremia.  She does have history of thyroid disease as well as takes an SSRI which could be contributory, but anticipate further work-up under hospitalist service.  I consulted with and after discussing the case with Dr. Blaine Hamper he will admit patient for further evaluation  Patient's presentation is most consistent with acute presentation with potential threat to life or bodily function.    Urinalysis reviewed interpreted as normal.  However given the patient's symptomatology of dysuria urgency if sent culture.  No clear evidence of infection on urinalysis today  FINAL CLINICAL IMPRESSION(S) / ED DIAGNOSES   Final diagnoses:  Dysuria  Hyponatremia  Generalized weakness     Rx  / DC Orders   ED Discharge Orders     None        Note:  This document was prepared using Dragon voice recognition software and may include unintentional dictation errors.   Delman Kitten, MD 06/18/22 [phone]

## 2022-06-18 NOTE — ED Notes (Signed)
Floor nurse agreed to patient.

## 2022-06-18 NOTE — ED Notes (Signed)
Bladder scan volume 260m.

## 2022-06-18 NOTE — H&P (Signed)
History and Physical    Cindy Huerta:[id number] DOB: [birth year] DOA: 06/18/2022  Referring MD/NP/PA:   PCP: Juluis Pitch, MD   Patient coming from:  The patient is coming from home.      Chief Complaint: dysuria  HPI: Cindy Huerta is a 76 y.o. female with medical history significant of HTN, hyperlipidemia, TIA, GERD, hypertension, depression with anxiety, CKD-380, GI bleeding, dCHF, hyponatremia, who presents with dysuria.  Patient states that she started having dysuria, burning on urination and difficulty urinating since last night.  Her bladder scan showed 228 cc of residual volume in ED. She has a lot of urge for urination.  She also complains of suprapubic abdominal discomfort, no hematuria.  Patient has nausea, dry heaves, no vomiting or abdominal pain per son (I called her son by phone).  Patient does not have chest pain, cough, shortness breath.  Patient has lot of anxiety per her son, which may have contributed to her symptoms per her son.   Data reviewed independently and ED Course: pt was found to have WBC 9.4, negative urinalysis, stable renal function, sodium 125, potassium 3.4, temperature normal, blood pressure 173/77, heart rate 49, RR 18, oxygen saturation 100% on room air.  CT scan for renal stone protocol is negative for acute findings, no obstructive stone or hydronephrosis.  Patient is placed on MedSurg bed for observation.   EKG: I have personally reviewed.  Sinus rhythm, QTc 471, LAD, no ischemic change.   Review of Systems:   General: no fevers, chills, no body weight gain, has fatigue HEENT: no blurry vision, hearing changes or sore throat Respiratory: no dyspnea, coughing, wheezing CV: no chest pain, no palpitations GI: has nausea, dry heaves, suprapubic abdominal discomfort, no diarrhea, constipation GU: Has dysuria, burning on urination, difficulty urinating, hematuria  Ext: no leg edema Neuro: no unilateral weakness, numbness, or tingling, no  vision change or hearing loss Skin: no rash, no skin tear. MSK: No muscle spasm, no deformity, no limitation of range of movement in spin Heme: No easy bruising.  Travel history: No recent [place] distant travel.   Allergy:  Allergies  Allergen Reactions   Latex Rash   Lisinopril Cough   Procaine Other (See Comments)    Unsure - told by DDS not to let anyone give it to her  Confusion    Diphenhydramine Hcl Other (See Comments)    States she was told to not take - jitteriness and agitation Not sure of reaction    Clarithromycin Other (See Comments)    Confusion     Oxycodone Nausea And Vomiting and Anxiety    Past Medical History:  Diagnosis Date   Arthritis    Breast cancer ([place])    Depression    GERD (gastroesophageal reflux disease)    HLD (hyperlipidemia)    Hypertension    Hypothyroidism     Past Surgical History:  Procedure Laterality Date   ABDOMINAL HYSTERECTOMY  1982   AXILLARY SENTINEL NODE BIOPSY Left 11/20/2021   Procedure: AXILLARY SENTINEL NODE BIOPSY;  Surgeon: Benjamine Sprague, DO;  Location: [facility] ORS;  Service: General;  Laterality: Left;   BREAST BIOPSY Left 09/23/2021   3:00 13cmfn venus marker, path pending   BREAST BIOPSY Left 09/23/2021   3:00 14 cmfn heart marker, path pending   CHOLECYSTECTOMY     ESOPHAGOGASTRODUODENOSCOPY (EGD) WITH PROPOFOL N/A 10/08/2021   Procedure: ESOPHAGOGASTRODUODENOSCOPY (EGD) WITH PROPOFOL;  Surgeon: Jonathon Bellows, MD;  Location: [facility] ENDOSCOPY;  Service: Gastroenterology;  Laterality: N/A;  HEMATOMA EVACUATION Left 11/21/2021   Procedure: EVACUATION HEMATOMA;  Surgeon: Benjamine Sprague, DO;  Location: [facility] ORS;  Service: General;  Laterality: Left;   left lumpectomy  2019   TOTAL MASTECTOMY Left 11/20/2021   Procedure: TOTAL MASTECTOMY;  Surgeon: Benjamine Sprague, DO;  Location: [facility] ORS;  Service: General;  Laterality: Left;    Social History:  reports that she has never smoked. She has never used smokeless tobacco. She  reports that she does not drink alcohol and does not use drugs.  Family History:  Family History  Problem Relation Age of Onset   Heart disease Mother    Cancer Paternal Grandmother      Prior to Admission medications   Medication Sig Start Date End Date Taking? Authorizing Provider  acetaminophen (TYLENOL) 500 MG tablet Take 500-1,000 mg by mouth every 6 (six) hours as needed for moderate pain.    [provider]  alum & mag hydroxide-simeth (MAALOX/MYLANTA) 200-200-20 MG/5ML suspension Take 30 mLs by mouth every 6 (six) hours as needed for indigestion or heartburn.    [provider]  Alum Hydroxide-Mag Trisilicate (GAVISCON) 35-36.1 MG CHEW Chew 1 tablet by mouth daily as needed (gas).    [provider]  ARTIFICIAL TEAR SOLUTION OP Place 1 drop into both eyes daily as needed (dry eyes).    [provider]  aspirin 81 MG EC tablet Take 1 tablet (81 mg total) by mouth daily. RESTART 48HRS AFTER DISCHARGE 11/25/21   Lysle Pearl, Isami, DO  atorvastatin (LIPITOR) 80 MG tablet Take 80 mg by mouth daily. 09/21/21   [provider]  FLUoxetine (PROZAC) 10 MG capsule Take 10 mg by mouth daily. 11/13/21   [provider]  fluticasone (FLONASE) 50 MCG/ACT nasal spray Place 1 spray into both nostrils daily as needed for allergies or rhinitis.    [provider]  hydrALAZINE (APRESOLINE) 25 MG tablet Take 25 mg by mouth 3 (three) times daily. 02/04/21   [provider]  levothyroxine (SYNTHROID) 75 MCG tablet Take 75 mcg by mouth daily before breakfast. 02/10/21   [provider]  magnesium oxide (MAG-OX) 400 MG tablet Take 400 mg by mouth daily.    [provider]  meclizine (ANTIVERT) 12.5 MG tablet Take 12.5 mg by mouth 3 (three) times daily as needed for dizziness.    [provider]  metoCLOPramide (REGLAN) 10 MG tablet Take 1 tablet (10 mg total) by mouth 3 (three) times daily with meals for 10 days. 01/13/22  01/23/22  Rada Hay, MD  metoprolol succinate (TOPROL-XL) 50 MG 24 hr tablet Take 50 mg by mouth 2 (two) times daily. 03/18/21   [provider]  Multiple Vitamins-Minerals (THERA-M) TABS Take 1 tablet by mouth daily.    [provider]  ondansetron (ZOFRAN ODT) 4 MG disintegrating tablet Take 1 tablet (4 mg total) by mouth every 8 (eight) hours as needed for nausea or vomiting. Patient not taking: Reported on 12/14/2021 10/10/21   Sidney Ace, MD  pantoprazole (PROTONIX) 40 MG tablet Take 1 tablet (40 mg total) by mouth daily. 10/10/21 12/14/21  Sidney Ace, MD  potassium chloride SA (KLOR-CON) 20 MEQ tablet Take 20 mEq by mouth 2 (two) times daily.    [provider]  telmisartan (MICARDIS) 80 MG tablet Take 80 mg by mouth daily. 10/06/18   [provider]  traMADol (ULTRAM) 50 MG tablet Take 1 tablet (50 mg total) by mouth every 6 (six) hours as needed for up  to 10 doses. 12/01/21   Duffy Bruce, MD  trolamine salicylate (ASPERCREME) 10 % cream Apply 1 application topically as needed for muscle pain.    [provider]  vitamin B-12 (CYANOCOBALAMIN) 100 MCG tablet Take 100 mg by mouth 2 (two) times daily.    [provider]    Physical Exam: Vitals:   06/18/22 1600 06/18/22 1630 06/18/22 1700 06/18/22 1730  BP: (!) 172/81 (!) 176/77 (!) 172/73   Pulse: (!) 53 64 62   Resp: 16  20   Temp:    97.9 F (36.6 C)  TempSrc:    Oral  SpO2: 100% 100% 100%   Weight:      Height:       General: Not in acute distress.  Patient is anxious.  Dry mucous membrane HEENT:       Eyes: PERRL, EOMI, no scleral icterus.       ENT: No discharge from the ears and nose, no pharynx injection, no tonsillar enlargement.        Neck: No JVD, no bruit, no mass felt. Heme: No neck lymph node enlargement. Cardiac: S1/S2, RRR, No murmurs, No gallops or rubs. Respiratory: No rales, wheezing, rhonchi or rubs. GI: Soft, nondistended, has  suprapubic abdominal tenderness, no rebound pain, no organomegaly, BS present. GU: No hematuria Ext: No pitting leg edema bilaterally. 1+DP/PT pulse bilaterally. Musculoskeletal: No joint deformities, No joint redness or warmth, no limitation of ROM in spin. Skin: No rashes.  Neuro: Alert, oriented X3, cranial nerves II-XII grossly intact, moves all extremities normally. Psych: Patient is not psychotic, no suicidal or hemocidal ideation.  Labs on Admission: I have personally reviewed following labs and imaging studies  CBC: Recent Labs  Lab 06/18/22 1501  WBC 9.4  HGB 12.4  HCT 35.9*  MCV 86.9  PLT 833   Basic Metabolic Panel: Recent Labs  Lab 06/18/22 1501  NA 125*  K 3.4*  CL 88*  CO2 25  GLUCOSE 88  BUN 15  CREATININE 1.07*  CALCIUM 9.9   GFR: Estimated Creatinine Clearance: 45.7 mL/min (A) (by C-G formula based on SCr of 1.07 mg/dL (H)). Liver Function Tests: Recent Labs  Lab 06/18/22 1501  AST 30  ALT 18  ALKPHOS 55  BILITOT 0.8  PROT 8.5*  ALBUMIN 4.5   No results for input(s): "LIPASE", "AMYLASE" in the last 168 hours. No results for input(s): "AMMONIA" in the last 168 hours. Coagulation Profile: No results for input(s): "INR", "PROTIME" in the last 168 hours. Cardiac Enzymes: No results for input(s): "CKTOTAL", "CKMB", "CKMBINDEX", "TROPONINI" in the last 168 hours. BNP (last 3 results) No results for input(s): "PROBNP" in the last 8760 hours. HbA1C: No results for input(s): "HGBA1C" in the last 72 hours. CBG: No results for input(s): "GLUCAP" in the last 168 hours. Lipid Profile: No results for input(s): "CHOL", "HDL", "LDLCALC", "TRIG", "CHOLHDL", "LDLDIRECT" in the last 72 hours. Thyroid Function Tests: No results for input(s): "TSH", "T4TOTAL", "FREET4", "T3FREE", "THYROIDAB" in the last 72 hours. Anemia Panel: No results for input(s): "VITAMINB12", "FOLATE", "FERRITIN", "TIBC", "IRON", "RETICCTPCT" in the last 72 hours. Urine analysis:     Component Value Date/Time   COLORURINE YELLOW (A) 06/18/2022 1209   APPEARANCEUR CLEAR (A) 06/18/2022 1209   LABSPEC 1.006 06/18/2022 1209   PHURINE 8.0 06/18/2022 1209   GLUCOSEU NEGATIVE 06/18/2022 1209   HGBUR NEGATIVE 06/18/2022 1209   BILIRUBINUR NEGATIVE 06/18/2022 [place] 06/18/2022 [place] 06/18/2022 1209  NITRITE NEGATIVE 06/18/2022 1209   LEUKOCYTESUR NEGATIVE 06/18/2022 1209   Sepsis Labs: '@LABRCNTIP'$ (procalcitonin:4,lacticidven:4) )No results found for this or any previous visit (from the past 240 hour(s)).   Radiological Exams on Admission: CT Renal Stone Study  Result Date: 06/18/2022 CLINICAL DATA:  Acute onset bladder pain and dysuria this morning. EXAM: CT ABDOMEN AND PELVIS WITHOUT CONTRAST TECHNIQUE: Multidetector CT imaging of the abdomen and pelvis was performed following the standard protocol without IV contrast. RADIATION DOSE REDUCTION: This exam was performed according to the departmental dose-optimization program which includes automated exposure control, adjustment of the mA and/or kV according to patient size and/or use of iterative reconstruction technique. COMPARISON:  10/06/2021 FINDINGS: Lower chest: No acute findings. Hepatobiliary: No mass visualized on this unenhanced exam. Prior cholecystectomy. No evidence of biliary obstruction. Pancreas: No mass or inflammatory process visualized on this unenhanced exam. Spleen:  Within normal limits in size. Adrenals/Urinary tract: No evidence of urolithiasis or hydronephrosis. Unopacified urinary bladder is nondilated and unremarkable in appearance. Stomach/Bowel: No evidence of obstruction, inflammatory process, or abnormal fluid collections. Diverticulosis is seen mainly involving the descending and sigmoid colon, however there is no evidence of diverticulitis. Vascular/Lymphatic: No pathologically enlarged lymph nodes identified. No evidence of abdominal aortic aneurysm. Aortic  atherosclerotic calcification incidentally noted. Reproductive: Prior hysterectomy noted. Adnexal regions are unremarkable in appearance. Other:  None. Musculoskeletal:  No suspicious bone lesions identified. IMPRESSION: No evidence of urolithiasis, hydronephrosis, or other acute findings. Colonic diverticulosis, without radiographic evidence of diverticulitis. Electronically Signed   By: Marlaine Hind M.D.   On: 06/18/2022 15:32      Assessment/Plan Principal Problem:   Hyponatremia Active Problems:   CKD (chronic kidney disease), stage IIIa   Hypothyroidism   HLD (hyperlipidemia)   Depression with anxiety   History of TIA (transient ischemic attack)   HTN (hypertension)   Hypokalemia   Chronic diastolic CHF (congestive heart failure) (HCC)   Dysuria   Assessment and Plan: * Hyponatremia Sodium 125.  Etiology is not clear.  Differential diagnosis include poor oral intake and dehydration, SIADH, thyroid dysfunction  - will place in tele bed for obs - Will check urine sodium, urine osmolality, serum osmolality. - Fluid restriction - hold Micardis - IVF:500 cc of NS, will continue with IV normal saline at 75 mL/h - f/u by BMP q8h - avoid over correction too fast due to risk of central pontine myelinolysis    CKD (chronic kidney disease), stage IIIa Stable. -f/u with BMP  Hypothyroidism - Continue Synthroid  HLD (hyperlipidemia) - Lipitor  Depression with anxiety - Continue home Prozac -Increase as needed Ativan dose from 0.5 mg daily to 3 times daily  History of TIA (transient ischemic attack) - Aspirin, Lipitor   HTN (hypertension) - IV hydralazine as needed -Decrease metoprolol dose from 50 to 25 mg twice daily due to bradycardia -Continue home clonidine, oral hydralazine -hold Micardis due to hyponatremia  Hypokalemia Potassium 3.4 -Repleted potassium -Check magnesium level and phosphorus level  Chronic diastolic CHF (congestive heart failure) ([place]) 2D  echo on 07/29/2021 showed EF of 55-60% with grade 2 diastolic dysfunction.  Patient does not have leg edema JVD CHF seem to be compensated. -Check BMP  Dysuria Etiology is not clear.  May be due to bladder spasm.  Urinalysis negative.  CT scan for renal stone protocol is negative. -Follow-up urine culture -Start Pyridium and oxybutynin empirically          DVT ppx: SQ Lovenox  Code Status: DNR per her son  Family Communication: Yes, patient's son by phone  Disposition Plan:  Anticipate discharge back to previous environment  Consults called:  none  Admission status and Level of care: Telemetry Medical:   for obs   Severity of Illness:  The appropriate patient status for this patient is OBSERVATION. Observation status is judged to be reasonable and necessary in order to provide the required intensity of service to ensure the patient's safety. The patient's presenting symptoms, physical exam findings, and initial radiographic and laboratory data in the context of their medical condition is felt to place them at decreased risk for further clinical deterioration. Furthermore, it is anticipated that the patient will be medically stable for discharge from the hospital within 2 midnights of admission.        Date of Service 06/18/2022    Ivor Costa [facility]   If 7PM-7AM, please contact night-coverage [email or web address] 06/18/2022, 6:00 PM

## 2022-06-18 NOTE — Assessment & Plan Note (Signed)
-   Continue home Prozac -Increase as needed Ativan dose from 0.5 mg daily to 3 times daily

## 2022-06-18 NOTE — Assessment & Plan Note (Signed)
Continue Synthroid °

## 2022-06-18 NOTE — Assessment & Plan Note (Signed)
2D echo on 07/29/2021 showed EF of 55-60% with grade 2 diastolic dysfunction.  Patient does not have leg edema JVD CHF seem to be compensated. -Check BMP

## 2022-06-18 NOTE — Assessment & Plan Note (Signed)
-   Aspirin, Lipitor

## 2022-06-18 NOTE — Assessment & Plan Note (Signed)
Stable -f/u with BMP 

## 2022-06-18 NOTE — Assessment & Plan Note (Signed)
Lipitor 

## 2022-06-18 NOTE — Assessment & Plan Note (Signed)
-   IV hydralazine as needed -Decrease metoprolol dose from 50 to 25 mg twice daily due to bradycardia -Continue home clonidine, oral hydralazine -hold Micardis due to hyponatremia

## 2022-06-18 NOTE — ED Notes (Signed)
Patient cannot tolerate purewick. Patient placed on bedpan by ED tech and was able to use it.

## 2022-06-18 NOTE — Assessment & Plan Note (Signed)
Potassium 3.4 -Repleted potassium -Check magnesium level and phosphorus level

## 2022-06-18 NOTE — ED Provider Triage Note (Signed)
Emergency Medicine Provider Triage Evaluation Note  Cindy Huerta , a 76 y.o. female  was evaluated in triage.  Pt complains of dysuria and decreased urination since 6am. No known fever.   Physical Exam  There were no vitals taken for this visit. Gen:   Awake, no distress   Resp:  Normal effort  MSK:   Moves extremities without difficulty  Other:    Medical Decision Making  Medically screening exam initiated at 11:59 AM.  Appropriate orders placed.  Aidan Caloca Mccune was informed that the remainder of the evaluation will be completed by another provider, this initial triage assessment does not replace that evaluation, and the importance of remaining in the ED until their evaluation is complete.    Victorino Dike, FNP 06/18/22 1315

## 2022-06-19 ENCOUNTER — Observation Stay: Payer: Medicare PPO

## 2022-06-19 DIAGNOSIS — K219 Gastro-esophageal reflux disease without esophagitis: Secondary | ICD-10-CM | POA: Diagnosis present

## 2022-06-19 DIAGNOSIS — Z66 Do not resuscitate: Secondary | ICD-10-CM | POA: Diagnosis present

## 2022-06-19 DIAGNOSIS — I130 Hypertensive heart and chronic kidney disease with heart failure and stage 1 through stage 4 chronic kidney disease, or unspecified chronic kidney disease: Secondary | ICD-10-CM | POA: Diagnosis present

## 2022-06-19 DIAGNOSIS — Z8673 Personal history of transient ischemic attack (TIA), and cerebral infarction without residual deficits: Secondary | ICD-10-CM | POA: Diagnosis not present

## 2022-06-19 DIAGNOSIS — E871 Hypo-osmolality and hyponatremia: Secondary | ICD-10-CM | POA: Diagnosis present

## 2022-06-19 DIAGNOSIS — R300 Dysuria: Secondary | ICD-10-CM | POA: Diagnosis present

## 2022-06-19 DIAGNOSIS — F411 Generalized anxiety disorder: Secondary | ICD-10-CM | POA: Diagnosis present

## 2022-06-19 DIAGNOSIS — Z91040 Latex allergy status: Secondary | ICD-10-CM | POA: Diagnosis not present

## 2022-06-19 DIAGNOSIS — I5032 Chronic diastolic (congestive) heart failure: Secondary | ICD-10-CM | POA: Diagnosis present

## 2022-06-19 DIAGNOSIS — E039 Hypothyroidism, unspecified: Secondary | ICD-10-CM | POA: Diagnosis present

## 2022-06-19 DIAGNOSIS — Z809 Family history of malignant neoplasm, unspecified: Secondary | ICD-10-CM | POA: Diagnosis not present

## 2022-06-19 DIAGNOSIS — E785 Hyperlipidemia, unspecified: Secondary | ICD-10-CM | POA: Diagnosis present

## 2022-06-19 DIAGNOSIS — M1990 Unspecified osteoarthritis, unspecified site: Secondary | ICD-10-CM | POA: Diagnosis present

## 2022-06-19 DIAGNOSIS — Z8249 Family history of ischemic heart disease and other diseases of the circulatory system: Secondary | ICD-10-CM | POA: Diagnosis not present

## 2022-06-19 DIAGNOSIS — Z79899 Other long term (current) drug therapy: Secondary | ICD-10-CM | POA: Diagnosis not present

## 2022-06-19 DIAGNOSIS — N1831 Chronic kidney disease, stage 3a: Secondary | ICD-10-CM | POA: Diagnosis present

## 2022-06-19 DIAGNOSIS — Z884 Allergy status to anesthetic agent status: Secondary | ICD-10-CM | POA: Diagnosis not present

## 2022-06-19 DIAGNOSIS — Z885 Allergy status to narcotic agent status: Secondary | ICD-10-CM | POA: Diagnosis not present

## 2022-06-19 DIAGNOSIS — Z888 Allergy status to other drugs, medicaments and biological substances status: Secondary | ICD-10-CM | POA: Diagnosis not present

## 2022-06-19 DIAGNOSIS — F32A Depression, unspecified: Secondary | ICD-10-CM | POA: Diagnosis present

## 2022-06-19 DIAGNOSIS — Z853 Personal history of malignant neoplasm of breast: Secondary | ICD-10-CM | POA: Diagnosis not present

## 2022-06-19 DIAGNOSIS — E876 Hypokalemia: Secondary | ICD-10-CM | POA: Diagnosis present

## 2022-06-19 DIAGNOSIS — Z881 Allergy status to other antibiotic agents status: Secondary | ICD-10-CM | POA: Diagnosis not present

## 2022-06-19 LAB — BASIC METABOLIC PANEL
Anion gap: 7 (ref 5–15)
Anion gap: 8 (ref 5–15)
BUN: 20 mg/dL (ref 8–23)
BUN: 19 mg/dL (ref 8–23)
CO2: 25 mmol/L (ref 22–32)
CO2: 24 mmol/L (ref 22–32)
Calcium: 9.2 mg/dL (ref 8.9–10.3)
Calcium: 9.2 mg/dL (ref 8.9–10.3)
Chloride: 97 mmol/L — ABNORMAL LOW (ref 98–111)
Chloride: 93 mmol/L — ABNORMAL LOW (ref 98–111)
Creatinine, Ser: 1.51 mg/dL — ABNORMAL HIGH (ref 0.44–1.00)
Creatinine, Ser: 1.36 mg/dL — ABNORMAL HIGH (ref 0.44–1.00)
GFR, Estimated: 35 mL/min — ABNORMAL LOW (ref >60)
GFR, Estimated: 40 mL/min — ABNORMAL LOW (ref >60)
Glucose, Bld: 92 mg/dL (ref 70–99)
Glucose, Bld: 91 mg/dL (ref 70–99)
Potassium: 4.1 mmol/L (ref 3.5–5.1)
Potassium: 3.7 mmol/L (ref 3.5–5.1)
Sodium: 129 mmol/L — ABNORMAL LOW (ref 135–145)
Sodium: 125 mmol/L — ABNORMAL LOW (ref 135–145)

## 2022-06-19 LAB — URINE CULTURE: Culture: <10000 — AB

## 2022-06-19 NOTE — Plan of Care (Signed)
m °

## 2022-06-19 NOTE — Evaluation (Signed)
Physical Therapy Evaluation Patient Details Name: Cindy Huerta MRN: [id number] DOB: 11-26-1946 Today's Date: 06/19/2022  History of Present Illness  Pt is a 76 y/o F admitted on 06/18/22 after presenting with c/o dysuria. Pt is being treated for hyponatremia & dysuria of unclear etiology, as urinalysis is negative & CT scan for renal stone protocol is negative. PMH: HTN, HLD, TIA, GERD, HTN, depression with anxiety, CKD, GI bleeding, hyponatremia   Clinical Impression  Pt seen for PT evaluation with co-tx with OT. Pt speaks at low volume with extra time 2/2 anxiety. Pt reports prior to admission she was ambulatory with PRN use of SPC, going to the [facility] 5x/week. On this date, pt is able to complete supine>sit with supervision with use of hospital bed features & doff/don sock sitting EOB with supervision. Pt requests to use RW and is able to transfer STS with CGA and begin ambulating with AD with min assist but after taking a few steps pt begins to c/o RLE not working & feeling dizzy. PT observes RLE & pt is dragging it, unable to advance it/take a step forward. Pt required +2 assist to safely return to bed where pt continues to c/o dizziness & then nausea & has brief periods of decreased processing. Rapid response called & nurse & MD entered room & made aware of situation. At this time will recommend STR upon d/c.  BP in RUE: 87/72 mmHg MAP 98, rechecked 132/81 mmHg MAP 94     Recommendations for follow up therapy are one component of a multi-disciplinary discharge planning process, led by the attending physician.  Recommendations may be updated based on patient status, additional functional criteria and insurance authorization.  Follow Up Recommendations Skilled nursing-short term rehab (<3 hours/day) Can patient physically be transported by private vehicle: No    Assistance Recommended at Discharge Frequent or constant Supervision/Assistance  Patient can return home with the following  A lot  of help with walking and/or transfers;A lot of help with bathing/dressing/bathroom;Assist for transportation;Assistance with cooking/housework;Help with stairs or ramp for entrance    Equipment Recommendations None recommended by PT  Recommendations for Other Services       Functional Status Assessment Patient has had a recent decline in their functional status and demonstrates the ability to make significant improvements in function in a reasonable and predictable amount of time.     Precautions / Restrictions Precautions Precautions: Fall Restrictions Weight Bearing Restrictions: No      Mobility  Bed Mobility Overal bed mobility: Needs Assistance Bed Mobility: Supine to Sit, Sit to Supine     Supine to sit: Supervision, HOB elevated (use of bed rails) Sit to supine: +2 for physical assistance, +2 for safety/equipment        Transfers Overall transfer level: Needs assistance Equipment used: Rolling walker (2 wheels) Transfers: Sit to/from Stand Sit to Stand: Min guard           General transfer comment: cuing for safe hand placement & extra time to successfully power up to standing    Ambulation/Gait                  Stairs            Wheelchair Mobility    Modified Rankin (Stroke Patients Only)       Balance Overall balance assessment: Needs assistance Sitting-balance support: Feet supported, Bilateral upper extremity supported Sitting balance-Leahy Scale: Good Sitting balance - Comments: pt able to sit EOB & don/doff sock with supervision  Standing balance-Leahy Scale: Fair                               Pertinent Vitals/Pain Pain Assessment Pain Assessment:  (no c/o pain during session but pt notes LLE pain at end of session -- MD & nurse in room & aware)    [place] expects to be discharged to:: Private residence Living Arrangements: Children Available Help at Discharge: Family;Available 24  hours/day Type of Home: House Home Access: Stairs to enter Entrance Stairs-Rails: None Entrance Stairs-Number of Steps: 1-2 Alternate Level Stairs-Number of Steps: 1/2 flight + landing + 1/2 flight Home Layout: Two level;Bed/bath upstairs Home Equipment: Conservation officer, nature (2 wheels);Cane - quad Additional Comments: son & daughter in law work from home    Prior Function               Mobility Comments: Pt reports she would drive to/from the [facility] because it was close to her house, goes to [employer] 5x/week, ambulatory without AD or PRN use of SPC       Hand Dominance        Extremity/Trunk Assessment   Upper Extremity Assessment Upper Extremity Assessment: Generalized weakness    Lower Extremity Assessment Lower Extremity Assessment: Generalized weakness       Communication   Communication:  (very soft spoken, requires extra time to verbalize words but apparently this is due to anxiety)  Cognition Arousal/Alertness: Awake/alert Behavior During Therapy: Anxious Overall Cognitive Status: Within Functional Limits for tasks assessed                                 General Comments: Pt appears anxious (son confirms this is baseline) but pt reports it is better, pt requires extra time to verbalize words 2/2 anxiety during session.        General Comments      Exercises     Assessment/Plan    PT Assessment Patient needs continued PT services  PT Problem List Decreased strength;Decreased mobility;Decreased safety awareness;Decreased balance;Decreased activity tolerance;Decreased knowledge of use of DME       PT Treatment Interventions DME instruction;Therapeutic exercise;Gait training;Balance training;Stair training;Neuromuscular re-education;Functional mobility training;Therapeutic activities;Patient/family education;Cognitive remediation    PT Goals (Current goals can be found in the Care Plan section)  Acute Rehab PT Goals Patient Stated Goal: get  better PT Goal Formulation: With patient Time For Goal Achievement: 07/03/22 Potential to Achieve Goals: Good    Frequency Min 2X/week     Co-evaluation PT/OT/SLP Co-Evaluation/Treatment: Yes Reason for Co-Treatment: Complexity of the patient's impairments (multi-system involvement);For patient/therapist safety PT goals addressed during session: Mobility/safety with mobility;Balance         AM-PAC PT "6 Clicks" Mobility  Outcome Measure Help needed turning from your back to your side while in a flat bed without using bedrails?: A Little Help needed moving from lying on your back to sitting on the side of a flat bed without using bedrails?: A Little Help needed moving to and from a bed to a chair (including a wheelchair)?: A Little Help needed standing up from a chair using your arms (e.g., wheelchair or bedside chair)?: A Little Help needed to walk in hospital room?: A Lot Help needed climbing 3-5 steps with a railing? : A Lot 6 Click Score: 16    End of Session   Activity Tolerance: Treatment limited secondary to  medical complications (Comment) Patient left: in bed;with nursing/sitter in room (MD in room to assess pt) Nurse Communication: Mobility status (events of session) PT Visit Diagnosis: Muscle weakness (generalized) (M62.81);Difficulty in walking, not elsewhere classified (R26.2);Unsteadiness on feet (R26.81)    Time: 7530-0511 PT Time Calculation (min) (ACUTE ONLY): 24 min   Charges:   PT Evaluation $PT Eval High Complexity: 1 High          Lavone Nian, PT, DPT 06/19/22, 12:47 PM   Waunita Schooner 06/19/2022, 12:41 PM

## 2022-06-19 NOTE — Plan of Care (Signed)
  Problem: Safety: Goal: Ability to remain free from injury will improve Outcome: Progressing   

## 2022-06-19 NOTE — Evaluation (Signed)
Occupational Therapy Evaluation Patient Details Name: Cindy Huerta MRN: [id number] DOB: 12/15/1945 Today's Date: 06/19/2022   History of Present Illness Pt is a 76 y/o F admitted on 06/18/22 after presenting with c/o dysuria. Pt is being treated for hyponatremia & dysuria of unclear etiology, as urinalysis is negative & CT scan for renal stone protocol is negative. PMH: HTN, HLD, TIA, GERD, HTN, depression with anxiety, CKD, GI bleeding, hyponatremia   Clinical Impression   Pt seen for OT evaluation with co-tx with PT. Upon entering room patient was at edge of bed with PT.  Patient was able to sit at edge of bed donning and doffing socks with no loss of balance.  Sit and stand was contact-guard to min assist using a rolling walker.  Pt speaks at low volume and a history of anxiety.  Patient report living with son and daughter-in-law at home on second level.  Was independent in ADLs.  As well as driving to the [facility] weekly for exercises.  Son present at start of ambulation.  Pt ambulated few steps with a rolling walker, she began to c/o RLE not working & feeling dizzy. OT observes RLE & pt is dragging it, unable to advance it/take a step forward. Pt required assist from OT and PT to safely return to bed where pt continues to c/o dizziness & then nausea & has brief periods of decreased processing. Rapid response called & nurse & MD entered room & made aware of situation. At this time will recommend STR or home health upon progress next few days as well as anxiety .     BP in RUE: 87/72 mmHg MAP 98, rechecked 132/81 mmHg MAP 94     Recommendations for follow up therapy are one component of a multi-disciplinary discharge planning process, led by the attending physician.  Recommendations may be updated based on patient status, additional functional criteria and insurance authorization.   Follow Up Recommendations  Home health OT ; or short-term rehab depending on progress next few days and her  anxiety.   Assistance Recommended at Discharge Intermittent Supervision/Assistance  Patient can return home with the following      Functional Status Assessment  Patient has had a recent decline in their functional status and demonstrates the ability to make significant improvements in function in a reasonable and predictable amount of time.  Equipment Recommendations       Recommendations for Other Services       Precautions / Restrictions Precautions Precautions: Fall Restrictions Weight Bearing Restrictions: No      Mobility Bed Mobility           Sit to supine: +2 for physical assistance, +2 for safety/equipment   General bed mobility comments: Upon  attempt for ambulation patient's right lower extremity got weak few steps into ambulation with walker- patient was unable to advance right leg, had to do hand-held assist with OT and PT to ambulate back to bed.  Assist sit to supine 2 people assist where patient had what appear few episodes of unresponsive ; called rapid team    Transfers Overall transfer level: Needs assistance Equipment used: Rolling walker (2 wheels)   Sit to Stand: Min guard           General transfer comment: cuing for safe hand placement & extra time to successfully power up to standing      Balance Overall balance assessment: Modified Independent Sitting-balance support: Feet supported Sitting balance-Leahy Scale: Good Sitting balance - Comments: Patient was  able to sit at edge of bed donning and doffing socks with no loss of balance.     Standing balance-Leahy Scale: Poor Standing balance comment: Needs a rolling walker or hand-held assist to maintain balance and standing.                           ADL either performed or assessed with clinical judgement   ADL Overall ADL's :  (Low-grade dressing was independent at side of bed.  Upper body dressing with supervision.)                                              Vision Baseline Vision/History: 1 Wears glasses       Perception     Praxis      Pertinent Vitals/Pain Pain Assessment Pain Assessment: No/denies pain     Hand Dominance     Extremity/Trunk Assessment Upper Extremity Assessment Upper Extremity Assessment: Overall WFL for tasks assessed   Lower Extremity Assessment Lower Extremity Assessment: Generalized weakness       Communication Communication Communication:  (very soft spoken, requires extra time to verbalize words but apparently this is due to anxiety)   Cognition Arousal/Alertness: Awake/alert Behavior During Therapy: Anxious Overall Cognitive Status: Within Functional Limits for tasks assessed                                 General Comments: Pt appears anxious (son confirms this is baseline) but pt reports it is better, pt requires extra time to verbalize words 2/2 anxiety during session.     General Comments       Exercises     Shoulder Instructions      Home Living Family/patient expects to be discharged to:: Private residence Living Arrangements: Children Available Help at Discharge: Family;Available 24 hours/day Type of Home: House Home Access: Stairs to enter [employer] of Steps: 1-2 Entrance Stairs-Rails: None Home Layout: Two level;Bed/bath upstairs Alternate Level Stairs-Number of Steps: 1/2 flight + landing + 1/2 flight Alternate Level Stairs-Rails: Left;Right Bathroom Shower/Tub: Walk-in shower (With a seat; no railing.)         Home Equipment: Conservation officer, nature (2 wheels);Cane - quad   Additional Comments: son & daughter in law work from home      Prior Functioning/Environment Prior Level of Function : Independent/Modified Independent             Mobility Comments: Pt reports she would drive to/from the [facility] because it was close to her house, goes to [employer] 5x/week, ambulatory without AD or PRN use of SPC ADLs Comments: Independent in  ADL/IADL PTA        OT Problem List: Decreased strength;Impaired UE functional use;Decreased safety awareness;Impaired balance (sitting and/or standing);Decreased activity tolerance      OT Treatment/Interventions: Self-care/ADL training;Balance training;Patient/family education;Neuromuscular education    OT Goals(Current goals can be found in the care plan section) Acute Rehab OT Goals Patient Stated Goal: Get better to can go home OT Goal Formulation: With patient Time For Goal Achievement: 07/03/22 Potential to Achieve Goals: Good  OT Frequency: Min 2X/week    Co-evaluation PT/OT/SLP Co-Evaluation/Treatment: Yes Reason for Co-Treatment: For patient/therapist safety;Necessary to address cognition/behavior during functional activity PT goals addressed during session: Mobility/safety with mobility;Balance OT goals addressed  during session: ADL's and self-care      AM-PAC OT "6 Clicks" Daily Activity     Outcome Measure Help from another person eating meals?: None Help from another person taking care of personal grooming?: None Help from another person toileting, which includes using toliet, bedpan, or urinal?: A Little Help from another person bathing (including washing, rinsing, drying)?: A Little Help from another person to put on and taking off regular upper body clothing?: A Little Help from another person to put on and taking off regular lower body clothing?: A Little 6 Click Score: 20   End of Session Equipment Utilized During Treatment: Gait belt;Rolling walker (2 wheels) Nurse Communication: Mobility status  Activity Tolerance: Patient tolerated treatment well (Patient has anxiety.  Had to call rapid response team help patient back to bed.) Patient left: in bed;with nursing/sitter in room  OT Visit Diagnosis: Unsteadiness on feet (R26.81);Muscle weakness (generalized) (M62.81);History of falling (Z91.81);Dizziness and giddiness (R42)                Time:  1030-1100 OT Time Calculation (min): 30 min Charges:  OT General Charges $OT Visit: 1 Visit    Rosalyn Gess OTR/L,CLT 06/19/2022, 4:00 PM

## 2022-06-19 NOTE — Progress Notes (Signed)
  Chaplain On-Call responded to Rapid Response notification at 1046 hours.  The patient had been revived by the treatment team, and was continuing to receive medical care.  Chaplain is available for support if requested.  Chaplain Pollyann Samples M.Div., [facility]

## 2022-06-19 NOTE — Progress Notes (Signed)
Rapid Response called,   Pt working with therapy, per pt she "felt dizzy", BP low, states feeling "very anxious" Rapid response team at bedside, along with MD.    BP rechecked- 132/81  MD Placing orders for CT scan.

## 2022-06-19 NOTE — Progress Notes (Signed)
PROGRESS NOTE    Cindy Huerta  JEH:[id number] DOB: 10-04-1946 DOA: 06/18/2022 PCP: Juluis Pitch, MD    Brief Narrative:  76 y.o. female with medical history significant of HTN, hyperlipidemia, TIA, GERD, hypertension, depression with anxiety, CKD-380, GI bleeding, dCHF, hyponatremia, who presents with dysuria.   Patient states that she started having dysuria, burning on urination and difficulty urinating since last night.  Her bladder scan showed 228 cc of residual volume in ED. She has a lot of urge for urination.  She also complains of suprapubic abdominal discomfort, no hematuria.  Patient has nausea, dry heaves, no vomiting or abdominal pain per son (I called her son by phone).  Patient does not have chest pain, cough, shortness breath.  Patient has lot of anxiety per her son, which may have contributed to her symptoms per her son.  Patient remains highly anxious.  Had a presumed anxiety attack when working with physical therapy on 7/15.  Rapid response called.  MD and ICU charge RN responded to bedside in addition to bedside RN.  Vital signs stable.  Repeat stat head CT reassuring.   Assessment & Plan:   Principal Problem:   Hyponatremia Active Problems:   CKD (chronic kidney disease), stage IIIa   Hypothyroidism   HLD (hyperlipidemia)   Depression with anxiety   History of TIA (transient ischemic attack)   HTN (hypertension)   Hypokalemia   Chronic diastolic CHF (congestive heart failure) (HCC)   Dysuria  Symptomatic hyponatremia Sodium 125.  Etiology is not clear.   Differential diagnosis include poor oral intake and dehydration, SIADH, thyroid dysfunction Suspect intravascular volume depletion/hypovolemic hyponatremia Plan: Continue normal saline 100 cc an hour Hold Micardis Every 8 hour BMP Avoid overcorrection Recheck sodium in a.m.     CKD (chronic kidney disease), stage IIIa Stable. Creatinine at or near baseline   Hypothyroidism - Continue  Synthroid   HLD (hyperlipidemia) - Lipitor   Depression with anxiety - Continue home Prozac -Increase as needed Ativan dose from 0.5 mg daily to 3 times daily   History of TIA (transient ischemic attack) - Aspirin, Lipitor     HTN (hypertension) - IV hydralazine as needed -Decrease metoprolol dose from 50 to 25 mg twice daily due to bradycardia -Continue home clonidine, oral hydralazine -hold Micardis due to hyponatremia   Hypokalemia Monitor and replace as necessary   Chronic diastolic CHF (congestive heart failure) ([place]) 2D echo on 07/29/2021 showed EF of 55-60% with grade 2 diastolic dysfunction.  Patient does not have leg edema JVD CHF seem to be compensated.    Dysuria Etiology is not clear.  May be due to bladder spasm.  Urinalysis negative.  CT scan for renal stone protocol is negative. -Continue Pyridium and oxybutynin as empiric treatment   DVT prophylaxis: SQ Lovenox Code Status: DNR Family Communication: Son at bedside 7/15 Disposition Plan: Status is: Observation The patient will require care spanning > 2 midnights and should be moved to inpatient because: Symptomatic hyponatremia.  Sodium 125   Level of care: Telemetry Medical  Consultants:  None  Procedures:  None  Antimicrobials: None   Subjective: Seen and examined.  Patient highly anxious.  Very soft voice.  Majority of history gained by speaking to the son at bedside.  Objective: Vitals:   06/19/22 0835 06/19/22 0958 06/19/22 1101 06/19/22 1231  BP: (!) 138/53 128/65 132/81 (!) 147/60  Pulse: (!) 57 (!) 58  (!) 53  Resp: 16   16  Temp: 98.7 F (37.1 C)  97.9 F (36.6 C)  TempSrc:      SpO2: 100% 100%  100%  Weight:      Height:        Intake/Output Summary (Last 24 hours) at 06/19/2022 1324 Last data filed at 06/18/2022 1904 Gross per 24 hour  Intake 1000 ml  Output 1050 ml  Net -50 ml   Filed Weights   06/18/22 1320 06/19/22 0500  Weight: 78 kg 71.1 kg     Examination:  General exam: Very anxious.  Soft voice.  Frail-appearing Respiratory system: Lungs clear.  Slightly tachypneic.  Normal work of breathing.  Room air Cardiovascular system: S1-S2, RRR, no murmurs, no pedal edema Gastrointestinal system: Soft, NT/ND, normal bowel sounds's Central nervous system: Alert.  Oriented x1.  No focal deficits Extremities: Symmetric 5 x 5 power. Skin: No rashes, lesions or ulcers Psychiatry: Judgement and insight appear impaired. Mood & affect anxious.     Data Reviewed: I have personally reviewed following labs and imaging studies  CBC: Recent Labs  Lab 06/18/22 1501  WBC 9.4  HGB 12.4  HCT 35.9*  MCV 86.9  PLT 242   Basic Metabolic Panel: Recent Labs  Lab 06/18/22 1501 06/18/22 2125 06/19/22 0432 06/19/22 1236  NA 125* 129* 129* 125*  K 3.4* 3.4* 4.1 3.7  CL 88* 92* 97* 93*  CO2 '25 25 25 24  '$ GLUCOSE 88 91 92 91  BUN '15 15 20 19  '$ CREATININE 1.07* 1.13* 1.51* 1.36*  CALCIUM 9.9 9.8 9.2 9.2  MG 1.9  --   --   --   PHOS 2.2*  --   --   --    GFR: Estimated Creatinine Clearance: 31.9 mL/min (A) (by C-G formula based on SCr of 1.36 mg/dL (H)). Liver Function Tests: Recent Labs  Lab 06/18/22 1501  AST 30  ALT 18  ALKPHOS 55  BILITOT 0.8  PROT 8.5*  ALBUMIN 4.5   No results for input(s): "LIPASE", "AMYLASE" in the last 168 hours. No results for input(s): "AMMONIA" in the last 168 hours. Coagulation Profile: No results for input(s): "INR", "PROTIME" in the last 168 hours. Cardiac Enzymes: No results for input(s): "CKTOTAL", "CKMB", "CKMBINDEX", "TROPONINI" in the last 168 hours. BNP (last 3 results) No results for input(s): "PROBNP" in the last 8760 hours. HbA1C: No results for input(s): "HGBA1C" in the last 72 hours. CBG: No results for input(s): "GLUCAP" in the last 168 hours. Lipid Profile: No results for input(s): "CHOL", "HDL", "LDLCALC", "TRIG", "CHOLHDL", "LDLDIRECT" in the last 72 hours. Thyroid  Function Tests: No results for input(s): "TSH", "T4TOTAL", "FREET4", "T3FREE", "THYROIDAB" in the last 72 hours. Anemia Panel: No results for input(s): "VITAMINB12", "FOLATE", "FERRITIN", "TIBC", "IRON", "RETICCTPCT" in the last 72 hours. Sepsis Labs: No results for input(s): "PROCALCITON", "LATICACIDVEN" in the last 168 hours.  No results found for this or any previous visit (from the past 240 hour(s)).       Radiology Studies: CT HEAD WO CONTRAST (5MM)  Result Date: 06/19/2022 CLINICAL DATA:  76 year old female with history of altered mental status. Anxiousness and dizziness. Head CT 01/13/2022. EXAM: CT HEAD WITHOUT CONTRAST TECHNIQUE: Contiguous axial images were obtained from the base of the skull through the vertex without intravenous contrast. RADIATION DOSE REDUCTION: This exam was performed according to the departmental dose-optimization program which includes automated exposure control, adjustment of the mA and/or kV according to patient size and/or use of iterative reconstruction technique. COMPARISON:  None Available. FINDINGS: Brain: Mild cerebral atrophy. Patchy and confluent areas  of decreased attenuation are noted throughout the deep and periventricular white matter of the cerebral hemispheres bilaterally, compatible with chronic microvascular ischemic disease. 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 No evidence of acute infarction, hemorrhage, hydrocephalus, extra-axial collection or mass lesion/mass effect. Vascular: No hyperdense vessel or unexpected calcification. Skull: Normal. Negative for fracture or focal lesion. Sinuses/Orbits: No acute finding. Other: None. IMPRESSION: 1. No acute intracranial abnormalities. 2. 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 Electronically Signed   By: Vinnie Langton M.D.   On: 06/19/2022 12:06   CT Renal Stone Study  Result Date: 06/18/2022 CLINICAL DATA:  Acute onset bladder pain and dysuria this morning. EXAM: CT ABDOMEN AND PELVIS WITHOUT CONTRAST TECHNIQUE: Multidetector CT imaging of the abdomen and pelvis was performed following the standard protocol without IV contrast. RADIATION DOSE REDUCTION: This exam was performed according to the departmental dose-optimization program which includes automated exposure control, adjustment of the mA and/or kV according to patient size and/or use of iterative reconstruction technique. COMPARISON:  10/06/2021 FINDINGS: Lower chest: No acute findings. Hepatobiliary: No mass visualized on this unenhanced exam. Prior cholecystectomy. No evidence of biliary obstruction. Pancreas: No mass or inflammatory process visualized on this unenhanced exam. Spleen:  Within normal limits in size. Adrenals/Urinary tract: No evidence of urolithiasis or hydronephrosis. Unopacified urinary bladder is nondilated and unremarkable in appearance. Stomach/Bowel: No evidence of obstruction, inflammatory process, or abnormal fluid collections. Diverticulosis is seen mainly involving the descending and sigmoid colon, however there is no evidence of diverticulitis. Vascular/Lymphatic: No pathologically enlarged lymph nodes identified. No evidence of abdominal aortic aneurysm. Aortic atherosclerotic calcification incidentally noted. Reproductive: Prior hysterectomy noted. Adnexal regions are unremarkable in appearance. Other:  None. Musculoskeletal:  No suspicious bone lesions identified. IMPRESSION: No evidence of urolithiasis, hydronephrosis, or other acute findings. Colonic diverticulosis, without radiographic evidence of diverticulitis. Electronically Signed   By: Marlaine Hind M.D.   On: 06/18/2022 15:32        Scheduled Meds:  aspirin EC  81 mg Oral Daily   atorvastatin  80 mg Oral Daily    cloNIDine  0.1 mg Oral BID   enoxaparin (LOVENOX) injection  40 mg Subcutaneous Q24H   FLUoxetine  10 mg Oral Daily   hydrALAZINE  25 mg Oral TID   levothyroxine  75 mcg Oral Q0600   magnesium oxide  400 mg Oral Daily   metoprolol succinate  25 mg Oral BID   multivitamin with minerals  1 tablet Oral Daily   oxybutynin  2.5 mg Oral BID   pantoprazole  40 mg Oral Daily   phenazopyridine  100 mg Oral TID WC   vitamin B-12  1,000 mcg Oral Daily   Continuous Infusions:  sodium chloride 100 mL/hr at 06/19/22 1235     LOS: 0 days      Sidney Ace, MD [facility]   If 7PM-7AM, please contact night-coverage  06/19/2022, 1:24 PM

## 2022-06-20 DIAGNOSIS — E871 Hypo-osmolality and hyponatremia: Secondary | ICD-10-CM | POA: Diagnosis not present

## 2022-06-20 LAB — BASIC METABOLIC PANEL
Anion gap: 7 (ref 5–15)
BUN: 13 mg/dL (ref 8–23)
CO2: 24 mmol/L (ref 22–32)
Calcium: 8.9 mg/dL (ref 8.9–10.3)
Chloride: 103 mmol/L (ref 98–111)
Creatinine, Ser: 0.92 mg/dL (ref 0.44–1.00)
GFR, Estimated: >60 mL/min (ref >60)
Glucose, Bld: 96 mg/dL (ref 70–99)
Potassium: 3.5 mmol/L (ref 3.5–5.1)
Sodium: 134 mmol/L — ABNORMAL LOW (ref 135–145)

## 2022-06-20 LAB — GLUCOSE, CAPILLARY: Glucose-Capillary: 157 mg/dL — ABNORMAL HIGH (ref 70–99)

## 2022-06-20 LAB — CBC WITH DIFFERENTIAL/PLATELET
Abs Immature Granulocytes: 0.03 10*3/uL (ref 0.00–0.07)
Basophils Absolute: 0 10*3/uL (ref 0.0–0.1)
Basophils Relative: 0 %
Eosinophils Absolute: 0.2 10*3/uL (ref 0.0–0.5)
Eosinophils Relative: 2 %
HCT: 32.8 % — ABNORMAL LOW (ref 36.0–46.0)
Hemoglobin: 11 g/dL — ABNORMAL LOW (ref 12.0–15.0)
Immature Granulocytes: 0 %
Lymphocytes Relative: 19 %
Lymphs Abs: 1.3 10*3/uL (ref 0.7–4.0)
MCH: 29.4 pg (ref 26.0–34.0)
MCHC: 33.5 g/dL (ref 30.0–36.0)
MCV: 87.7 fL (ref 80.0–100.0)
Monocytes Absolute: 0.8 10*3/uL (ref 0.1–1.0)
Monocytes Relative: 12 %
Neutro Abs: 4.6 10*3/uL (ref 1.7–7.7)
Neutrophils Relative %: 67 %
Platelets: 199 10*3/uL (ref 150–400)
RBC: 3.74 MIL/uL — ABNORMAL LOW (ref 3.87–5.11)
RDW: 13 % (ref 11.5–15.5)
WBC: 7 10*3/uL (ref 4.0–10.5)
nRBC: 0 % (ref 0.0–0.2)

## 2022-06-20 MED ORDER — IRBESARTAN 150 MG PO TABS
300.0000 mg | ORAL_TABLET | Freq: Every day | ORAL | Status: DC
  Administered 2022-06-20: 300 mg via ORAL
  Filled 2022-06-20: qty 2

## 2022-06-20 MED ORDER — HYDRALAZINE HCL 20 MG/ML IJ SOLN: 10.0000 mg | INTRAMUSCULAR | Status: DC | PRN

## 2022-06-20 NOTE — TOC Initial Note (Signed)
Transition of Care [facility]) - Initial/Assessment Note    Patient Details  Name: Cindy Huerta MRN: [id number] Date of Birth: 12-Mar-1946  [facility]) CM/SW Contact:    Candie Chroman, LCSW Phone Number: 06/20/2022, 1:06 PM  Clinical Narrative:  CSW met with patient. No supports at bedside. CSW introduced role and explained that therapy recommendations would be discussed. Patient is not interested in SNF placement due to bad experience earlier this year. She is agreeable to home health. She worked with an Chief Strategy Officer after SNF but could not remember their name. Based on Patient Cindy Huerta, it was [facility]. Unable to reach anyone at their office so sent secure email to [facility] representative with referral information, requesting PT, OT, and aide if possible. Patient has a RW and cane at home. She lives with her son Remo Lipps and his wife. Patient stated they both work from home. Patient is walking with PT around the unit now. No further concerns. CSW encouraged patient to contact CSW as needed. CSW will continue to follow patient for support and facilitate return home when stable.     Expected Discharge Plan: [place] Barriers to Discharge: Continued Medical Work up   Patient Goals and CMS Choice        Expected Discharge Plan and Services Expected Discharge Plan: [place] Acute Care Choice: [place] arrangements for the past 2 months: Single Family Home                                      Prior Living Arrangements/Services Living arrangements for the past 2 months: Single Family Home Lives with:: Adult Children Patient language and need for interpreter reviewed:: Yes Do you feel safe going back to the place where you live?: Yes      Need for Family Participation in Patient Care: Yes (Comment) Care giver support system in place?: Yes (comment) Current home services: DME Criminal Activity/Legal Involvement Pertinent  to Current Situation/Hospitalization: No - Comment as needed  Activities of Daily Living Home Assistive Devices/Equipment: Dentures (specify type), Walker (specify type), Cane (specify quad or straight) ADL Screening (condition at time of admission) Patient's cognitive ability adequate to safely complete daily activities?: No Is the patient deaf or have difficulty hearing?: No Does the patient have difficulty seeing, even when wearing glasses/contacts?: No Does the patient have difficulty concentrating, remembering, or making decisions?: Yes Patient able to express need for assistance with ADLs?: Yes Does the patient have difficulty dressing or bathing?: Yes Independently performs ADLs?: No Communication: Independent Dressing (OT): Needs assistance Is this a change from baseline?: Pre-admission baseline Grooming: Needs assistance Is this a change from baseline?: Pre-admission baseline Feeding: Appropriate for developmental age Bathing: Needs assistance Is this a change from baseline?: Pre-admission baseline In/Out Bed: Independent with device (comment) Walks in Home: Independent with device (comment) Does the patient have difficulty walking or climbing stairs?: Yes Weakness of Legs: Left Weakness of Arms/Hands: Left  Permission Sought/Granted Permission sought to share information with : Chartered certified accountant granted to share information with : Yes, Verbal Permission Granted     Permission granted to share info w AGENCY: Home Health Agencies        Emotional Assessment Appearance:: Appears stated age Attitude/Demeanor/Rapport: Engaged, Gracious Affect (typically observed): Appropriate, Calm, Pleasant Orientation: : Oriented to Self, Oriented to Place, Oriented to  Time, Oriented to Situation Alcohol / Substance Use: Not Applicable Psych Involvement: No (comment)  Admission diagnosis:  Dysuria [R30.0] Hyponatremia [E87.1] Generalized weakness  [R53.1] Patient Active Problem List   Diagnosis Date Noted   Hyponatremia 06/18/2022   Hypokalemia 06/18/2022   Chronic diastolic CHF (congestive heart failure) ([place]) 06/18/2022   Dysuria 06/18/2022   HTN (hypertension) 11/24/2021   Normocytic anemia 11/24/2021   Vertigo 11/24/2021   Breast CA ([place]) 11/22/2021   Breast cancer ([place]) 11/20/2021   Intractable nausea and vomiting 10/09/2021   Weakness 10/07/2021   Carcinoma of overlapping sites of left breast in female, estrogen receptor positive ([place]) 10/06/2021   Dizziness 10/06/2021   Rectal bleeding 10/06/2021   Nausea & vomiting 10/06/2021   GIB (gastrointestinal bleeding) 10/06/2021   Left-sided weakness 07/29/2021   HLD (hyperlipidemia) 07/29/2021   Hypothyroidism 07/29/2021   Left sided numbness 07/29/2021   Depression with anxiety    CKD (chronic kidney disease), stage IIIa    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   Epigastric pain 04/23/2021   GERD (gastroesophageal reflux disease) 04/23/2021   Benign essential hypertension 01/20/2021   History of breast cancer 01/20/2021   History of TIA (transient ischemic attack) 01/20/2021   Palpitations 01/20/2021   Malignant neoplasm of upper-outer quadrant of left female breast ([place]) 12/06/2018   PCP:  Juluis Pitch, MD Pharmacy:   [id number] - [place], [address] [place] [address] Phone: [phone] Fax: [phone]     Social Determinants of Health (SDOH) Interventions    Readmission Risk Interventions    06/20/2022    1:01 PM 11/25/2021    1:15 PM 10/10/2021    1:24 PM  Readmission Risk Prevention Plan  Transportation Screening  Complete Complete  PCP or Specialist Appt within 5-7 Days   Complete  PCP or Specialist Appt within 3-5 Days Complete Complete   Home Care Screening   Complete  Medication Review (RN CM)   Complete  HRI or Home Care Consult  Complete   Social Work Consult for [place] Planning/Counseling  Complete Complete   Palliative Care Screening Not Applicable Not Applicable   Medication Review Press photographer)  Complete

## 2022-06-20 NOTE — Progress Notes (Signed)
Physical Therapy Treatment Patient Details Name: Cindy Huerta MRN: [id number] DOB: 11-May-1946 Today's Date: 06/20/2022   History of Present Illness Pt is a 76 y/o F admitted on 06/18/22 after presenting with c/o dysuria. Pt is being treated for hyponatremia & dysuria of unclear etiology, as urinalysis is negative & CT scan for renal stone protocol is negative. PMH: HTN, HLD, TIA, GERD, HTN, depression with anxiety, CKD, GI bleeding, hyponatremia    PT Comments    Significant improvement for activity this date. Pt able to demonstrate bed mobility with ModI, sit<>stand transfers from various surfaces with supervision, and tolerated gait training with RW, 269f, and S/CGA. Pt with minimal anxiety after receiving meds and able to participate well with PT. Currently appears functionally safe to return home with HHPT.  Recommendations for follow up therapy are one component of a multi-disciplinary discharge planning process, led by the attending physician.  Recommendations may be updated based on patient status, additional functional criteria and insurance authorization.  Follow Up Recommendations  Home health PT Can patient physically be transported by private vehicle: Yes   Assistance Recommended at Discharge Intermittent Supervision/Assistance  Patient can return home with the following A little help with walking and/or transfers;A little help with bathing/dressing/bathroom;Assistance with cooking/housework;Assist for transportation   Equipment Recommendations  None recommended by PT    Recommendations for Other Services       Precautions / Restrictions Precautions Precautions: Fall Restrictions Weight Bearing Restrictions: No     Mobility  Bed Mobility Overal bed mobility: Needs Assistance Bed Mobility: Supine to Sit, Sit to Supine     Supine to sit: Modified independent (Device/Increase time) Sit to supine: Modified independent (Device/Increase time)         Transfers Overall transfer level: Needs assistance Equipment used: Right platform walker Transfers: Sit to/from Stand Sit to Stand: Modified independent (Device/Increase time)                Ambulation/Gait Ambulation/Gait assistance: Supervision Gait Distance (Feet): 200 Feet Assistive device: Rolling walker (2 wheels) Gait Pattern/deviations: WFL(Within Functional Limits)           Stairs             Wheelchair Mobility    Modified Rankin (Stroke Patients Only)       Balance Overall balance assessment: Modified Independent Sitting-balance support: Feet supported Sitting balance-Leahy Scale: Good       Standing balance-Leahy Scale: Good Standing balance comment: Needs a rolling walker or hand-held assist to maintain balance and standing.                            Cognition Arousal/Alertness: Awake/alert Behavior During Therapy: WFL for tasks assessed/performed (slow to talk, less anxious after Ativan) Overall Cognitive Status: Within Functional Limits for tasks assessed                                 General Comments:  (anxiety at baseline)        Exercises General Exercises - Lower Extremity Ankle Circles/Pumps: AROM, Both, 20 reps [place] Arc Quad: AROM, Both, 10 reps Hip Flexion/Marching: AROM, Both, 10 reps    General Comments        Pertinent Vitals/Pain Pain Assessment Pain Assessment: No/denies pain    Home Living  Prior Function            PT Goals (current goals can now be found in the care plan section) Acute Rehab PT Goals Patient Stated Goal: get better, go home Progress towards PT goals: Progressing toward goals    Frequency    Min 2X/week      PT Plan      Co-evaluation              AM-PAC PT "6 Clicks" Mobility   Outcome Measure  Help needed turning from your back to your side while in a flat bed without using bedrails?: A  Little Help needed moving from lying on your back to sitting on the side of a flat bed without using bedrails?: A Little Help needed moving to and from a bed to a chair (including a wheelchair)?: A Little Help needed standing up from a chair using your arms (e.g., wheelchair or bedside chair)?: A Little Help needed to walk in hospital room?: A Little Help needed climbing 3-5 steps with a railing? : A Little 6 Click Score: 18    End of Session Equipment Utilized During Treatment: Gait belt Activity Tolerance: Patient tolerated treatment well Patient left: in chair;with call bell/phone within reach;with chair alarm set Nurse Communication: Mobility status PT Visit Diagnosis: Muscle weakness (generalized) (M62.81);Difficulty in walking, not elsewhere classified (R26.2);Unsteadiness on feet (R26.81)     Time: 4627-0350 PT Time Calculation (min) (ACUTE ONLY): 32 min  Charges:  $Gait Training: 8-22 mins $Therapeutic Exercise: 8-22 mins                    Mikel Cella, PTA   Josie Dixon 06/20/2022, 1:58 PM

## 2022-06-20 NOTE — Progress Notes (Signed)
I made a patient visit to check on the patient's health status. I provided spiritual support through pastoral presence, words of encouragement, and prayer.    06/20/22 1000  Clinical Encounter Type  Visited With Patient and family together  Visit Type Follow-up  Referral From Family  Consult/Referral To Chaplain  Spiritual Encounters  Spiritual Needs Prayer    Chaplain Dr Redgie Grayer

## 2022-06-20 NOTE — Plan of Care (Signed)
  Problem: Clinical Measurements: Goal: Will remain free from infection Outcome: Progressing   Problem: Activity: Goal: Risk for activity intolerance will decrease Outcome: Progressing   Problem: Nutrition: Goal: Adequate nutrition will be maintained Outcome: Progressing   Problem: Coping: Goal: Level of anxiety will decrease Outcome: Not Progressing   Problem: Elimination: Goal: Will not experience complications related to bowel motility Outcome: Progressing Goal: Will not experience complications related to urinary retention Outcome: Progressing   Problem: Pain Managment: Goal: General experience of comfort will improve Outcome: Progressing   Problem: Safety: Goal: Ability to remain free from injury will improve Outcome: Progressing

## 2022-06-20 NOTE — Progress Notes (Signed)
   06/19/22 2011 06/19/22 2040 06/19/22 2110  Assess: MEWS Score  Temp 98.3 F (36.8 C)  --   --   BP (!) 206/78  --  (!) 185/70  MAP (mmHg) 113  --  104  Pulse Rate 62  --  (!) 52  Resp 18  --   --   Level of Consciousness  --  Alert  --   SpO2 99 %  --   --   O2 Device Room Air  --   --   Assess: MEWS Score  MEWS Temp 0 0 0  MEWS Systolic 2 2 0  MEWS Pulse 0 0 0  MEWS RR 0 0 0  MEWS LOC 0 0 0  MEWS Score 2 2 0  MEWS Score Color Yellow Yellow Green    06/20/22 0025 06/20/22 0130  Assess: MEWS Score  Temp 98.1 F (36.7 C)  --   BP (!) 188/68 (!) 169/68  MAP (mmHg) 103 98  Pulse Rate 66 74  Resp 18 18  Level of Consciousness  --   --   SpO2 100 % 100 %  O2 Device Room Air Room Air  Assess: MEWS Score  MEWS Temp 0 0  MEWS Systolic 0 0  MEWS Pulse 0 0  MEWS RR 0 0  MEWS LOC 0 0  MEWS Score 0 0  MEWS Score Color Ailene Ards

## 2022-06-20 NOTE — Discharge Summary (Signed)
Physician Discharge Summary  Cindy Huerta VEH:[id number] DOB: 10-09-1946 DOA: 06/18/2022  PCP: Juluis Pitch, MD  Admit date: 06/18/2022 Discharge date: 06/20/2022  Admitted From: Home Disposition:  Home with home health  Recommendations for Outpatient Follow-up:  Follow up with PCP in 1-2 weeks Establish care with outpatient psychiatry  Home Health: Yes PT OT Equipment/Devices: None  Discharge Condition: Stable CODE STATUS: DNR Diet recommendation: Regular  Brief/Interim Summary: 76 y.o. female with medical history significant of HTN, hyperlipidemia, TIA, GERD, hypertension, depression with anxiety, CKD-380, GI bleeding, dCHF, hyponatremia, who presents with dysuria.   Patient states that she started having dysuria, burning on urination and difficulty urinating since last night.  Her bladder scan showed 228 cc of residual volume in ED. She has a lot of urge for urination.  She also complains of suprapubic abdominal discomfort, no hematuria.  Patient has nausea, dry heaves, no vomiting or abdominal pain per son (I called her son by phone).  Patient does not have chest pain, cough, shortness breath.  Patient has lot of anxiety per her son, which may have contributed to her symptoms per her son.   Patient remains highly anxious.  Had a presumed anxiety attack when working with physical therapy on 7/15.  Rapid response called.  MD and ICU charge RN responded to bedside in addition to bedside RN.  Vital signs stable.  Repeat stat head CT reassuring.  Improved at time of discharge.  Sodium back in reference range.  Ambulating around the unit with physical therapy.  Stable for discharge home at this time.  Discussed with son.  Suspect that anxiety was primary driver for patient's presentation.  No evidence of infectious process.  Strongly recommend seeking care with outpatient psychiatry for management of presumed anxiety disorder.    Discharge Diagnoses:  Principal Problem:    Hyponatremia Active Problems:   CKD (chronic kidney disease), stage IIIa   Hypothyroidism   HLD (hyperlipidemia)   Depression with anxiety   History of TIA (transient ischemic attack)   HTN (hypertension)   Hypokalemia   Chronic diastolic CHF (congestive heart failure) (HCC)   Dysuria Symptomatic hyponatremia Sodium 125.  Etiology is not clear.   Differential diagnosis include poor oral intake and dehydration, SIADH, thyroid dysfunction Suspect intravascular volume depletion/hypovolemic hyponatremia Plan: Serum sodium improved with administration of isotonic fluids.  Sodium 134 at time of discharge.  Can resume home antihypertensive regimen including Micardis.  Follow-up outpatient PCP.     Discharge Instructions  Discharge Instructions     Diet - low sodium heart healthy   Complete by: As directed    Increase activity slowly   Complete by: As directed       Allergies as of 06/20/2022       Reactions   Latex Rash   Lisinopril Cough   Procaine Other (See Comments)   Unsure - told by DDS not to let anyone give it to her  Confusion   Diphenhydramine Hcl Other (See Comments)   States she was told to not take - jitteriness and agitation Not sure of reaction   Clarithromycin Other (See Comments)   Confusion   Oxycodone Nausea And Vomiting, Anxiety        Medication List     STOP taking these medications    Gaviscon 80-14.2 MG Chew Generic drug: Alum Hydroxide-Mag Trisilicate   ondansetron 4 MG disintegrating tablet Commonly known as: Zofran ODT       TAKE these medications    acetaminophen 500 MG  tablet Commonly known as: TYLENOL Take 500-1,000 mg by mouth every 6 (six) hours as needed for moderate pain.   alum & mag hydroxide-simeth 200-200-20 MG/5ML suspension Commonly known as: MAALOX/MYLANTA Take 30 mLs by mouth every 6 (six) hours as needed for indigestion or heartburn.   ARTIFICIAL TEAR SOLUTION OP Place 1 drop into both eyes daily as needed  (dry eyes).   aspirin EC 81 MG tablet Take 1 tablet (81 mg total) by mouth daily. RESTART 48HRS AFTER DISCHARGE   atorvastatin 80 MG tablet Commonly known as: LIPITOR Take 80 mg by mouth daily.   cloNIDine 0.1 MG tablet Commonly known as: CATAPRES Take 0.1 mg by mouth 2 (two) times daily.   FLUoxetine 10 MG capsule Commonly known as: PROZAC Take 10 mg by mouth daily.   fluticasone 50 MCG/ACT nasal spray Commonly known as: FLONASE Place 1 spray into both nostrils daily as needed for allergies or rhinitis.   hydrALAZINE 25 MG tablet Commonly known as: APRESOLINE Take 25 mg by mouth 3 (three) times daily.   levothyroxine 75 MCG tablet Commonly known as: SYNTHROID Take 75 mcg by mouth daily before breakfast.   LORazepam 0.5 MG tablet Commonly known as: ATIVAN Take 1 tablet by mouth daily.   magnesium oxide 400 MG tablet Commonly known as: MAG-OX Take 400 mg by mouth daily.   meclizine 12.5 MG tablet Commonly known as: ANTIVERT Take 12.5 mg by mouth 3 (three) times daily as needed for dizziness.   metoCLOPramide 10 MG tablet Commonly known as: REGLAN Take 1 tablet (10 mg total) by mouth 3 (three) times daily with meals for 10 days.   metoprolol succinate 50 MG 24 hr tablet Commonly known as: TOPROL-XL Take 50 mg by mouth 2 (two) times daily.   pantoprazole 40 MG tablet Commonly known as: PROTONIX Take 1 tablet (40 mg total) by mouth daily.   potassium chloride SA 20 MEQ tablet Commonly known as: KLOR-CON M Take 20 mEq by mouth 2 (two) times daily.   telmisartan 80 MG tablet Commonly known as: MICARDIS Take 80 mg by mouth daily.   Thera-M Tabs Take 1 tablet by mouth daily.   traMADol 50 MG tablet Commonly known as: Ultram Take 1 tablet (50 mg total) by mouth every 6 (six) hours as needed for up to 10 doses.   trolamine salicylate 10 % cream Commonly known as: ASPERCREME Apply 1 application topically as needed for muscle pain.   vitamin B-12 100 MCG  tablet Commonly known as: CYANOCOBALAMIN Take 100 mg by mouth 2 (two) times daily.        Allergies  Allergen Reactions   Latex Rash   Lisinopril Cough   Procaine Other (See Comments)    Unsure - told by DDS not to let anyone give it to her  Confusion    Diphenhydramine Hcl Other (See Comments)    States she was told to not take - jitteriness and agitation Not sure of reaction    Clarithromycin Other (See Comments)    Confusion     Oxycodone Nausea And Vomiting and Anxiety    Consultations: None   Procedures/Studies: CT HEAD WO CONTRAST (5MM)  Result Date: 06/19/2022 CLINICAL DATA:  76 year old female with history of altered mental status. Anxiousness and dizziness. Head CT 01/13/2022. EXAM: CT HEAD WITHOUT CONTRAST TECHNIQUE: Contiguous axial images were obtained from the base of the skull through the vertex without intravenous contrast. RADIATION DOSE REDUCTION: This exam was performed according to the departmental dose-optimization program which  includes automated exposure control, adjustment of the mA and/or kV according to patient size and/or use of iterative reconstruction technique. COMPARISON:  None Available. FINDINGS: Brain: Mild cerebral atrophy. Patchy and confluent areas of decreased attenuation are noted throughout the deep and periventricular white matter of the cerebral hemispheres bilaterally, compatible with chronic microvascular ischemic disease. 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 No evidence of acute infarction, hemorrhage, hydrocephalus, extra-axial collection or mass lesion/mass effect. Vascular: No hyperdense vessel or unexpected calcification. Skull: Normal. Negative for fracture or focal lesion. Sinuses/Orbits: No acute finding. Other: None. IMPRESSION: 1. No acute intracranial abnormalities. 2. 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 Electronically Signed   By: Vinnie Langton M.D.   On: 06/19/2022 12:06   CT Renal Stone Study  Result Date: 06/18/2022 CLINICAL DATA:  Acute onset bladder pain and dysuria this morning. EXAM: CT ABDOMEN AND PELVIS WITHOUT CONTRAST TECHNIQUE: Multidetector CT imaging of the abdomen and pelvis was performed following the standard protocol without IV contrast. RADIATION DOSE REDUCTION: This exam was performed according to the departmental dose-optimization program which includes automated exposure control, adjustment of the mA and/or kV according to patient size and/or use of iterative reconstruction technique. COMPARISON:  10/06/2021 FINDINGS: Lower chest: No acute findings. Hepatobiliary: No mass visualized on this unenhanced exam. Prior cholecystectomy. No evidence of biliary obstruction. Pancreas: No mass or inflammatory process visualized on this unenhanced exam. Spleen:  Within normal limits in size. Adrenals/Urinary tract: No evidence of urolithiasis or hydronephrosis. Unopacified urinary bladder is nondilated and unremarkable in appearance. Stomach/Bowel: No evidence of obstruction, inflammatory process, or abnormal fluid collections. Diverticulosis is seen mainly involving the descending and sigmoid colon, however there is no evidence of diverticulitis. Vascular/Lymphatic: No pathologically enlarged lymph nodes identified. No evidence of abdominal aortic aneurysm. Aortic atherosclerotic calcification incidentally noted. Reproductive: Prior hysterectomy noted. Adnexal regions are unremarkable in appearance. Other:  None. Musculoskeletal:  No suspicious bone lesions identified. IMPRESSION: No evidence of urolithiasis, hydronephrosis, or other acute findings. Colonic diverticulosis, without radiographic evidence of diverticulitis. Electronically Signed   By: Marlaine Hind M.D.   On: 06/18/2022  15:32      Subjective: Seen and examined the day of DC.  Stable no distress.  Mental status at baseline.  Stable for discharge home.  Discharge Exam: Vitals:   06/20/22 0851 06/20/22 1222  BP: (!) 186/81 (!) 160/67  Pulse: 78 63  Resp: 17   Temp: 98.3 F (36.8 C)   SpO2: 99%    Vitals:   06/20/22 0526 06/20/22 0600 06/20/22 0851 06/20/22 1222  BP: (!) 181/77 (!) 166/82 (!) 186/81 (!) 160/67  Pulse: 68 79 78 63  Resp: 17  17   Temp: 98.2 F (36.8 C)  98.3 F (36.8 C)   TempSrc:      SpO2: 100%  99%   Weight:      Height:        General: Pt is alert, awake, not in acute distress Cardiovascular: RRR, S1/S2 +, no rubs, no gallops Respiratory: CTA bilaterally, no wheezing, no rhonchi Abdominal: Soft, NT, ND, bowel sounds + Extremities: no edema, no cyanosis    The results of significant diagnostics from this hospitalization (including imaging, microbiology, ancillary and laboratory) are listed below for reference.     Microbiology: Recent Results (from  the past 240 hour(s))  Urine Culture     Status: Abnormal   Collection Time: 06/18/22 12:09 PM   Specimen: Urine, Clean Catch  Result Value Ref Range Status   Specimen Description   Final    URINE, CLEAN CATCH Performed at [facility], [address], [place], [place] [address]    Special Requests   Final    NONE Performed at [facility] [address] [place], [place] [address]    Culture (A)  Final    <10,000 COLONIES/mL INSIGNIFICANT GROWTH Performed at [facility] [address], [place], [place] [address]    Report Status 06/19/2022 FINAL  Final     Labs: BNP (last 3 results) Recent Labs    06/18/22 1501  BNP 06.2   Basic Metabolic Panel: Recent Labs  Lab 06/18/22 1501 06/18/22 2125 06/19/22 0432 06/19/22 1236 06/20/22 0834  NA 125* 129* 129* 125* 134*  K 3.4* 3.4* 4.1 3.7 3.5  CL 88* 92* 97* 93* 103  CO2 '25 25 25 24 24  '$ GLUCOSE 88 91 92 91 96  BUN '15 15  20 19 13  '$ CREATININE 1.07* 1.13* 1.51* 1.36* 0.92  CALCIUM 9.9 9.8 9.2 9.2 8.9  MG 1.9  --   --   --   --   PHOS 2.2*  --   --   --   --    Liver Function Tests: Recent Labs  Lab 06/18/22 1501  AST 30  ALT 18  ALKPHOS 55  BILITOT 0.8  PROT 8.5*  ALBUMIN 4.5   No results for input(s): "LIPASE", "AMYLASE" in the last 168 hours. No results for input(s): "AMMONIA" in the last 168 hours. CBC: Recent Labs  Lab 06/18/22 1501 06/20/22 0834  WBC 9.4 7.0  NEUTROABS  --  4.6  HGB 12.4 11.0*  HCT 35.9* 32.8*  MCV 86.9 87.7  PLT 220 199   Cardiac Enzymes: No results for input(s): "CKTOTAL", "CKMB", "CKMBINDEX", "TROPONINI" in the last 168 hours. BNP: Invalid input(s): "POCBNP" CBG: Recent Labs  Lab 06/20/22 1145  GLUCAP 157*   D-Dimer No results for input(s): "DDIMER" in the last 72 hours. Hgb A1c No results for input(s): "HGBA1C" in the last 72 hours. Lipid Profile No results for input(s): "CHOL", "HDL", "LDLCALC", "TRIG", "CHOLHDL", "LDLDIRECT" in the last 72 hours. Thyroid function studies No results for input(s): "TSH", "T4TOTAL", "T3FREE", "THYROIDAB" in the last 72 hours.  Invalid input(s): "FREET3" Anemia work up No results for input(s): "VITAMINB12", "FOLATE", "FERRITIN", "TIBC", "IRON", "RETICCTPCT" in the last 72 hours. Urinalysis    Component Value Date/Time   COLORURINE YELLOW (A) 06/18/2022 1209   APPEARANCEUR CLEAR (A) 06/18/2022 1209   LABSPEC 1.006 06/18/2022 1209   PHURINE 8.0 06/18/2022 1209   GLUCOSEU NEGATIVE 06/18/2022 1209   HGBUR NEGATIVE 06/18/2022 1209   BILIRUBINUR NEGATIVE 06/18/2022 1209   KETONESUR NEGATIVE 06/18/2022 1209   PROTEINUR NEGATIVE 06/18/2022 1209   NITRITE NEGATIVE 06/18/2022 1209   LEUKOCYTESUR NEGATIVE 06/18/2022 1209   Sepsis Labs Recent Labs  Lab 06/18/22 1501 06/20/22 0834  WBC 9.4 7.0   Microbiology Recent Results (from the past 240 hour(s))  Urine Culture     Status: Abnormal   Collection Time: 06/18/22  12:09 PM   Specimen: Urine, Clean Catch  Result Value Ref Range Status   Specimen Description   Final    URINE, CLEAN CATCH Performed at [facility], [address], [place], [place] [address]    Special Requests  Final    NONE Performed at [facility], [address], [place], [place] [address]    Culture (A)  Final    <10,000 COLONIES/mL INSIGNIFICANT GROWTH Performed at [facility] [address], [place], [place] [address]    Report Status 06/19/2022 FINAL  Final     Time coordinating discharge: Over 30 minutes  SIGNED:   Sidney Ace, MD  [facility] 06/20/2022, 1:46 PM Pager   If 7PM-7AM, please contact night-coverage

## 2022-06-20 NOTE — Plan of Care (Signed)
  Problem: Activity: Goal: Risk for activity intolerance will decrease Outcome: Progressing   Problem: Coping: Goal: Level of anxiety will decrease Outcome: Progressing   Problem: Elimination: Goal: Will not experience complications related to bowel motility Outcome: Progressing   Problem: Pain Managment: Goal: General experience of comfort will improve Outcome: Progressing   

## 2022-06-20 NOTE — TOC Transition Note (Signed)
Transition of Care [facility]) - CM/SW Discharge Note   Patient Details  Name: Cindy Huerta MRN: [id number] Date of Birth: Jan 19, 1946  [facility] CM/SW Contact:  Candie Chroman, LCSW Phone Number: 06/20/2022, 2:02 PM   Clinical Narrative:   Patient has orders to discharge home today. [facility] is able to accept patient for PT and OT but cannot start services until Thursday 7/20. Patient is aware and agreeable to this rather than looking for another agency that can see her sooner. Faxed home health orders to intake department at [facility]. No DME needs. No further concerns. CSW signing off.  Final next level of care: [place] Barriers to Discharge: No Barriers Identified   Patient Goals and CMS Choice        Discharge Placement                Patient to be transferred to facility by: Son   Patient and family notified of of transfer: 06/20/22  Discharge Plan and Services     Post Acute Care Choice: Home Health                    HH Arranged: PT, OT [facility] Agency: [facility] Date [facility] Agency Contacted: 06/20/22   Representative spoke with at [place]: [place] ([place]) Interventions     Readmission Risk Interventions    06/20/2022    1:01 PM 11/25/2021    1:15 PM 10/10/2021    1:24 PM  Readmission Risk Prevention Plan  Transportation Screening  Complete Complete  PCP or Specialist Appt within 5-7 Days   Complete  PCP or Specialist Appt within 3-5 Days Complete Complete   Home Care Screening   Complete  Medication Review (RN CM)   Complete  HRI or Home Care Consult  Complete   Social Work Consult for [place] Planning/Counseling Complete Complete   Palliative Care Screening Not Applicable Not Applicable   Medication Review Press photographer)  Complete

## 2022-06-28 ENCOUNTER — Inpatient Hospital Stay (HOSPITAL_COMMUNITY)
Admission: EM | Admit: 2022-06-28 | Discharge: 2022-07-01 | DRG: 683 | Disposition: A | Discharge status: Skilled Nursing Facility | Payer: Medicare Other | Attending: Family Medicine | Admitting: Family Medicine

## 2022-06-28 ENCOUNTER — Emergency Department (HOSPITAL_COMMUNITY): Payer: Medicare Other

## 2022-06-28 ENCOUNTER — Encounter (HOSPITAL_COMMUNITY): Payer: Self-pay | Admitting: Emergency Medicine

## 2022-06-28 ENCOUNTER — Other Ambulatory Visit: Payer: Self-pay

## 2022-06-28 DIAGNOSIS — Z885 Allergy status to narcotic agent status: Secondary | ICD-10-CM | POA: Diagnosis not present

## 2022-06-28 DIAGNOSIS — E039 Hypothyroidism, unspecified: Secondary | ICD-10-CM | POA: Diagnosis not present

## 2022-06-28 DIAGNOSIS — Z8673 Personal history of transient ischemic attack (TIA), and cerebral infarction without residual deficits: Secondary | ICD-10-CM | POA: Diagnosis not present

## 2022-06-28 DIAGNOSIS — K219 Gastro-esophageal reflux disease without esophagitis: Secondary | ICD-10-CM | POA: Diagnosis not present

## 2022-06-28 DIAGNOSIS — R339 Retention of urine, unspecified: Secondary | ICD-10-CM | POA: Diagnosis not present

## 2022-06-28 DIAGNOSIS — F0394 Unspecified dementia, unspecified severity, with anxiety: Secondary | ICD-10-CM | POA: Diagnosis not present

## 2022-06-28 DIAGNOSIS — I130 Hypertensive heart and chronic kidney disease with heart failure and stage 1 through stage 4 chronic kidney disease, or unspecified chronic kidney disease: Secondary | ICD-10-CM | POA: Diagnosis not present

## 2022-06-28 DIAGNOSIS — N1832 Chronic kidney disease, stage 3b: Secondary | ICD-10-CM

## 2022-06-28 DIAGNOSIS — Y92009 Unspecified place in unspecified non-institutional (private) residence as the place of occurrence of the external cause: Secondary | ICD-10-CM

## 2022-06-28 DIAGNOSIS — Z79899 Other long term (current) drug therapy: Secondary | ICD-10-CM

## 2022-06-28 DIAGNOSIS — Z9049 Acquired absence of other specified parts of digestive tract: Secondary | ICD-10-CM | POA: Diagnosis not present

## 2022-06-28 DIAGNOSIS — I10 Essential (primary) hypertension: Secondary | ICD-10-CM | POA: Diagnosis present

## 2022-06-28 DIAGNOSIS — N179 Acute kidney failure, unspecified: Principal | ICD-10-CM | POA: Diagnosis present

## 2022-06-28 DIAGNOSIS — Z9012 Acquired absence of left breast and nipple: Secondary | ICD-10-CM

## 2022-06-28 DIAGNOSIS — E785 Hyperlipidemia, unspecified: Secondary | ICD-10-CM | POA: Diagnosis not present

## 2022-06-28 DIAGNOSIS — F418 Other specified anxiety disorders: Secondary | ICD-10-CM | POA: Diagnosis not present

## 2022-06-28 DIAGNOSIS — E871 Hypo-osmolality and hyponatremia: Secondary | ICD-10-CM | POA: Diagnosis not present

## 2022-06-28 DIAGNOSIS — Z7982 Long term (current) use of aspirin: Secondary | ICD-10-CM

## 2022-06-28 DIAGNOSIS — Z888 Allergy status to other drugs, medicaments and biological substances status: Secondary | ICD-10-CM | POA: Diagnosis not present

## 2022-06-28 DIAGNOSIS — R338 Other retention of urine: Secondary | ICD-10-CM | POA: Diagnosis not present

## 2022-06-28 DIAGNOSIS — Z881 Allergy status to other antibiotic agents status: Secondary | ICD-10-CM

## 2022-06-28 DIAGNOSIS — Z91040 Latex allergy status: Secondary | ICD-10-CM

## 2022-06-28 DIAGNOSIS — Z79890 Hormone replacement therapy (postmenopausal): Secondary | ICD-10-CM | POA: Diagnosis not present

## 2022-06-28 DIAGNOSIS — W07XXXA Fall from chair, initial encounter: Secondary | ICD-10-CM | POA: Diagnosis present

## 2022-06-28 DIAGNOSIS — Z8249 Family history of ischemic heart disease and other diseases of the circulatory system: Secondary | ICD-10-CM

## 2022-06-28 DIAGNOSIS — Z66 Do not resuscitate: Secondary | ICD-10-CM | POA: Insufficient documentation

## 2022-06-28 DIAGNOSIS — E440 Moderate protein-calorie malnutrition: Secondary | ICD-10-CM | POA: Diagnosis present

## 2022-06-28 DIAGNOSIS — Z853 Personal history of malignant neoplasm of breast: Secondary | ICD-10-CM

## 2022-06-28 DIAGNOSIS — E860 Dehydration: Secondary | ICD-10-CM | POA: Diagnosis present

## 2022-06-28 DIAGNOSIS — E876 Hypokalemia: Secondary | ICD-10-CM | POA: Diagnosis present

## 2022-06-28 DIAGNOSIS — I5032 Chronic diastolic (congestive) heart failure: Secondary | ICD-10-CM | POA: Diagnosis present

## 2022-06-28 DIAGNOSIS — N183 Chronic kidney disease, stage 3 unspecified: Secondary | ICD-10-CM | POA: Diagnosis present

## 2022-06-28 DIAGNOSIS — N1831 Chronic kidney disease, stage 3a: Secondary | ICD-10-CM | POA: Diagnosis not present

## 2022-06-28 DIAGNOSIS — Z6825 Body mass index (BMI) 25.0-25.9, adult: Secondary | ICD-10-CM

## 2022-06-28 DIAGNOSIS — F0393 Unspecified dementia, unspecified severity, with mood disturbance: Secondary | ICD-10-CM | POA: Diagnosis present

## 2022-06-28 HISTORY — DX: Chronic kidney disease, stage 3b: N18.32

## 2022-06-28 LAB — BASIC METABOLIC PANEL
Anion gap: 23 — ABNORMAL HIGH (ref 5–15)
BUN: 20 mg/dL (ref 8–23)
CO2: 21 mmol/L — ABNORMAL LOW (ref 22–32)
Calcium: 10.2 mg/dL (ref 8.9–10.3)
Chloride: 85 mmol/L — ABNORMAL LOW (ref 98–111)
Creatinine, Ser: 2 mg/dL — ABNORMAL HIGH (ref 0.44–1.00)
GFR, Estimated: 25 mL/min — ABNORMAL LOW (ref >60)
Glucose, Bld: 115 mg/dL — ABNORMAL HIGH (ref 70–99)
Potassium: 2.7 mmol/L — CL (ref 3.5–5.1)
Sodium: 129 mmol/L — ABNORMAL LOW (ref 135–145)

## 2022-06-28 LAB — CBC WITH DIFFERENTIAL/PLATELET
Abs Immature Granulocytes: 0.16 10*3/uL — ABNORMAL HIGH (ref 0.00–0.07)
Basophils Absolute: 0 10*3/uL (ref 0.0–0.1)
Basophils Relative: 0 %
Eosinophils Absolute: 0 10*3/uL (ref 0.0–0.5)
Eosinophils Relative: 0 %
HCT: 42.5 % (ref 36.0–46.0)
Hemoglobin: 15.1 g/dL — ABNORMAL HIGH (ref 12.0–15.0)
Immature Granulocytes: 1 %
Lymphocytes Relative: 8 %
Lymphs Abs: 1.5 10*3/uL (ref 0.7–4.0)
MCH: 30.2 pg (ref 26.0–34.0)
MCHC: 35.5 g/dL (ref 30.0–36.0)
MCV: 85 fL (ref 80.0–100.0)
Monocytes Absolute: 1.8 10*3/uL — ABNORMAL HIGH (ref 0.1–1.0)
Monocytes Relative: 10 %
Neutro Abs: 14.8 10*3/uL — ABNORMAL HIGH (ref 1.7–7.7)
Neutrophils Relative %: 81 %
Platelets: 326 10*3/uL (ref 150–400)
RBC: 5 MIL/uL (ref 3.87–5.11)
RDW: 12.9 % (ref 11.5–15.5)
WBC: 18.3 10*3/uL — ABNORMAL HIGH (ref 4.0–10.5)
nRBC: 0 % (ref 0.0–0.2)

## 2022-06-28 MED ORDER — CLONIDINE HCL 0.1 MG PO TABS
0.1000 mg | ORAL_TABLET | Freq: Two times a day (BID) | ORAL | Status: DC
  Administered 2022-06-28 – 2022-07-01 (×6): 0.1 mg via ORAL
  Filled 2022-06-28 (×6): qty 1

## 2022-06-28 MED ORDER — ACETAMINOPHEN 650 MG RE SUPP: 650.0000 mg | Freq: Four times a day (QID) | RECTAL | Status: DC | PRN

## 2022-06-28 MED ORDER — LORAZEPAM 2 MG/ML IJ SOLN
0.5000 mg | Freq: Once | INTRAMUSCULAR | Status: AC
  Administered 2022-06-28: 0.5 mg via INTRAVENOUS
  Filled 2022-06-28: qty 1

## 2022-06-28 MED ORDER — METOPROLOL SUCCINATE ER 25 MG PO TB24
50.0000 mg | ORAL_TABLET | Freq: Two times a day (BID) | ORAL | Status: DC
  Administered 2022-06-28 – 2022-07-01 (×6): 50 mg via ORAL
  Filled 2022-06-28 (×6): qty 2

## 2022-06-28 MED ORDER — LEVOTHYROXINE SODIUM 75 MCG PO TABS
75.0000 ug | ORAL_TABLET | Freq: Every day | ORAL | Status: DC
  Administered 2022-06-29 – 2022-07-01 (×3): 75 ug via ORAL
  Filled 2022-06-28 (×3): qty 1

## 2022-06-28 MED ORDER — LACTATED RINGERS IV BOLUS
1000.0000 mL | Freq: Once | INTRAVENOUS | Status: AC
  Administered 2022-06-28: 1000 mL via INTRAVENOUS

## 2022-06-28 MED ORDER — ASPIRIN 81 MG PO TBEC
81.0000 mg | DELAYED_RELEASE_TABLET | Freq: Every day | ORAL | Status: DC
  Administered 2022-06-28 – 2022-07-01 (×4): 81 mg via ORAL
  Filled 2022-06-28 (×4): qty 1

## 2022-06-28 MED ORDER — HYDRALAZINE HCL 20 MG/ML IJ SOLN: 5.0000 mg | INTRAMUSCULAR | Status: DC | PRN

## 2022-06-28 MED ORDER — ATORVASTATIN CALCIUM 80 MG PO TABS
80.0000 mg | ORAL_TABLET | Freq: Every day | ORAL | Status: DC
  Administered 2022-06-28 – 2022-06-30 (×3): 80 mg via ORAL
  Filled 2022-06-28 (×3): qty 1

## 2022-06-28 MED ORDER — DOCUSATE SODIUM 100 MG PO CAPS
100.0000 mg | ORAL_CAPSULE | Freq: Two times a day (BID) | ORAL | Status: DC
  Administered 2022-06-28 – 2022-07-01 (×6): 100 mg via ORAL
  Filled 2022-06-28 (×6): qty 1

## 2022-06-28 MED ORDER — ONDANSETRON HCL 4 MG/2ML IJ SOLN
4.0000 mg | Freq: Once | INTRAMUSCULAR | Status: AC
Start: 2022-06-28 — End: 2022-06-28
  Administered 2022-06-28: 4 mg via INTRAVENOUS
  Filled 2022-06-28: qty 2

## 2022-06-28 MED ORDER — LACTATED RINGERS IV SOLN: INTRAVENOUS | Status: DC

## 2022-06-28 MED ORDER — TRAMADOL HCL 50 MG PO TABS
50.0000 mg | ORAL_TABLET | Freq: Four times a day (QID) | ORAL | Status: DC | PRN
  Administered 2022-06-29 – 2022-07-01 (×3): 50 mg via ORAL
  Filled 2022-06-28 (×3): qty 1

## 2022-06-28 MED ORDER — ENOXAPARIN SODIUM 30 MG/0.3ML IJ SOSY
30.0000 mg | PREFILLED_SYRINGE | INTRAMUSCULAR | Status: DC
  Administered 2022-06-28 – 2022-06-30 (×3): 30 mg via SUBCUTANEOUS
  Filled 2022-06-28 (×3): qty 0.3

## 2022-06-28 MED ORDER — LORAZEPAM 0.5 MG PO TABS
0.5000 mg | ORAL_TABLET | ORAL | Status: DC | PRN
  Administered 2022-06-29: 0.5 mg via ORAL
  Filled 2022-06-28: qty 1

## 2022-06-28 MED ORDER — POTASSIUM CHLORIDE 10 MEQ/100ML IV SOLN
10.0000 meq | Freq: Once | INTRAVENOUS | Status: AC
  Administered 2022-06-28: 10 meq via INTRAVENOUS
  Filled 2022-06-28: qty 100

## 2022-06-28 MED ORDER — ACETAMINOPHEN 325 MG PO TABS
650.0000 mg | ORAL_TABLET | Freq: Four times a day (QID) | ORAL | Status: DC | PRN
  Administered 2022-06-28 – 2022-06-29 (×2): 650 mg via ORAL
  Filled 2022-06-28 (×3): qty 2

## 2022-06-28 MED ORDER — PANTOPRAZOLE SODIUM 40 MG PO TBEC
40.0000 mg | DELAYED_RELEASE_TABLET | Freq: Two times a day (BID) | ORAL | Status: DC
  Administered 2022-06-28 – 2022-07-01 (×6): 40 mg via ORAL
  Filled 2022-06-28 (×6): qty 1

## 2022-06-28 MED ORDER — POLYETHYLENE GLYCOL 3350 17 G PO PACK: 17.0000 g | PACK | Freq: Every day | ORAL | Status: DC | PRN

## 2022-06-28 MED ORDER — HYDRALAZINE HCL 25 MG PO TABS
25.0000 mg | ORAL_TABLET | Freq: Three times a day (TID) | ORAL | Status: DC
  Administered 2022-06-28 – 2022-07-01 (×9): 25 mg via ORAL
  Filled 2022-06-28 (×10): qty 1

## 2022-06-28 MED ORDER — ENOXAPARIN SODIUM 40 MG/0.4ML IJ SOSY: 40.0000 mg | PREFILLED_SYRINGE | INTRAMUSCULAR | Status: DC

## 2022-06-28 MED ORDER — FLUOXETINE HCL 20 MG PO CAPS
20.0000 mg | ORAL_CAPSULE | Freq: Every day | ORAL | Status: DC
  Administered 2022-06-29: 20 mg via ORAL
  Filled 2022-06-28: qty 1

## 2022-06-28 MED ORDER — HALOPERIDOL LACTATE 5 MG/ML IJ SOLN
2.0000 mg | Freq: Four times a day (QID) | INTRAMUSCULAR | Status: DC | PRN
Start: 2022-06-28 — End: 2022-07-01

## 2022-06-28 MED ORDER — HYDROXYZINE HCL 50 MG/ML IM SOLN
25.0000 mg | Freq: Four times a day (QID) | INTRAMUSCULAR | Status: DC | PRN
  Administered 2022-06-28: 25 mg via INTRAMUSCULAR
  Filled 2022-06-28: qty 0.5

## 2022-06-28 MED ORDER — ONDANSETRON HCL 4 MG/2ML IJ SOLN
4.0000 mg | Freq: Four times a day (QID) | INTRAMUSCULAR | Status: DC | PRN
  Administered 2022-06-29 (×2): 4 mg via INTRAVENOUS
  Filled 2022-06-28 (×2): qty 2

## 2022-06-28 MED ORDER — ONDANSETRON HCL 4 MG PO TABS: 4.0000 mg | ORAL_TABLET | Freq: Four times a day (QID) | ORAL | Status: DC | PRN

## 2022-06-28 MED ORDER — SODIUM CHLORIDE 0.9% FLUSH
3.0000 mL | Freq: Two times a day (BID) | INTRAVENOUS | Status: DC
  Administered 2022-06-28 – 2022-06-30 (×4): 3 mL via INTRAVENOUS

## 2022-06-28 MED ORDER — BISACODYL 5 MG PO TBEC: 5.0000 mg | DELAYED_RELEASE_TABLET | Freq: Every day | ORAL | Status: DC | PRN

## 2022-06-28 NOTE — H&P (Signed)
History and Physical    Patient: Cindy Huerta:[id number] DOB: May 09, 1946 DOA: 06/28/2022 DOS: the patient was seen and examined on 06/28/2022 PCP: Juluis Pitch, MD  Patient coming from: Home - lives with son and his wife; NOK: [place], [place] [phone]   Chief Complaint:   HPI: Cindy Huerta is a 76 y.o. female with medical history significant of HTN; HLD; anxiety/depression; stage 3b CKD; GI bleeding; chronic GI bleeding; and hyponatremia presenting with a mechanical fall.  She was last admitted at [facility] from 7/14-16 with symptomatic hyponatremia.  She had significant anxiety/panic during the hospitalization and family is now expressing concern about dementia.  He reports that she has significant anxiety and when she gets anxious she won't eat or drink and has dry heaves.  He is concerned about underlying dementia and thinks that she needs to be placed.    ER Course:  Similar to prior.  N/v, decreased PO intake.  AKI, hyponatremia.  Likely worsening underlying dementia as the cause of recurrent admissions.     Review of Systems: As mentioned in the history of present illness. All other systems reviewed and are negative.  Limited by inability/unwillingness to answer some questions and recurrent heaves without frank emesis.  Past Medical History:  Diagnosis Date   Arthritis    Breast cancer ([place])    Depression    GERD (gastroesophageal reflux disease)    HLD (hyperlipidemia)    Hypertension    Hypothyroidism    Past Surgical History:  Procedure Laterality Date   ABDOMINAL HYSTERECTOMY  1982   AXILLARY SENTINEL NODE BIOPSY Left 11/20/2021   Procedure: AXILLARY SENTINEL NODE BIOPSY;  Surgeon: Benjamine Sprague, DO;  Location: [facility] ORS;  Service: General;  Laterality: Left;   BREAST BIOPSY Left 09/23/2021   3:00 13cmfn venus marker, path pending   BREAST BIOPSY Left 09/23/2021   3:00 14 cmfn heart marker, path pending   CHOLECYSTECTOMY     ESOPHAGOGASTRODUODENOSCOPY  (EGD) WITH PROPOFOL N/A 10/08/2021   Procedure: ESOPHAGOGASTRODUODENOSCOPY (EGD) WITH PROPOFOL;  Surgeon: Jonathon Bellows, MD;  Location: [facility] ENDOSCOPY;  Service: Gastroenterology;  Laterality: N/A;   HEMATOMA EVACUATION Left 11/21/2021   Procedure: EVACUATION HEMATOMA;  Surgeon: Benjamine Sprague, DO;  Location: [facility] ORS;  Service: General;  Laterality: Left;   left lumpectomy  2019   TOTAL MASTECTOMY Left 11/20/2021   Procedure: TOTAL MASTECTOMY;  Surgeon: Benjamine Sprague, DO;  Location: [facility] ORS;  Service: General;  Laterality: Left;   Social History:  reports that she has never smoked. She has never used smokeless tobacco. She reports that she does not drink alcohol and does not use drugs.  Allergies  Allergen Reactions   Latex Rash   Lisinopril Cough   Procaine Other (See Comments)    Unsure - told by DDS not to let anyone give it to her  Confusion    Diphenhydramine Hcl Other (See Comments)    States she was told to not take - jitteriness and agitation Not sure of reaction    Clarithromycin Other (See Comments)    Confusion     Oxycodone Nausea And Vomiting and Anxiety    Family History  Problem Relation Age of Onset   Heart disease Mother    Cancer Paternal Grandmother     Prior to Admission medications   Medication Sig Start Date End Date Taking? Authorizing Provider  acetaminophen (TYLENOL) 500 MG tablet Take 500-1,000 mg by mouth every 6 (six) hours as needed for moderate pain.    [provider]  alum & mag hydroxide-simeth (MAALOX/MYLANTA) 200-200-20 MG/5ML suspension Take 30 mLs by mouth every 6 (six) hours as needed for indigestion or heartburn.    [provider]  ARTIFICIAL TEAR SOLUTION OP Place 1 drop into both eyes daily as needed (dry eyes).    [provider]  aspirin 81 MG EC tablet Take 1 tablet (81 mg total) by mouth daily. RESTART 48HRS AFTER DISCHARGE 11/25/21   Lysle Pearl, Isami, DO  atorvastatin (LIPITOR) 80 MG tablet Take 80 mg by mouth  daily. 09/21/21   [provider]  cloNIDine (CATAPRES) 0.1 MG tablet Take 0.1 mg by mouth 2 (two) times daily. 05/16/22   [provider]  FLUoxetine (PROZAC) 10 MG capsule Take 10 mg by mouth daily. 11/13/21   [provider]  fluticasone (FLONASE) 50 MCG/ACT nasal spray Place 1 spray into both nostrils daily as needed for allergies or rhinitis.    [provider]  hydrALAZINE (APRESOLINE) 25 MG tablet Take 25 mg by mouth 3 (three) times daily. 02/04/21   [provider]  levothyroxine (SYNTHROID) 75 MCG tablet Take 75 mcg by mouth daily before breakfast. 02/10/21   [provider]  LORazepam (ATIVAN) 0.5 MG tablet Take 1 tablet by mouth daily. 06/11/22   [provider]  magnesium oxide (MAG-OX) 400 MG tablet Take 400 mg by mouth daily.    [provider]  meclizine (ANTIVERT) 12.5 MG tablet Take 12.5 mg by mouth 3 (three) times daily as needed for dizziness.    [provider]  metoCLOPramide (REGLAN) 10 MG tablet Take 1 tablet (10 mg total) by mouth 3 (three) times daily with meals for 10 days. 01/13/22 01/23/22  Rada Hay, MD  metoprolol succinate (TOPROL-XL) 50 MG 24 hr tablet Take 50 mg by mouth 2 (two) times daily. 03/18/21   [provider]  Multiple Vitamins-Minerals (THERA-M) TABS Take 1 tablet by mouth daily.    [provider]  pantoprazole (PROTONIX) 40 MG tablet Take 1 tablet (40 mg total) by mouth daily. 10/10/21 06/18/22  Sidney Ace, MD  potassium chloride SA (KLOR-CON) 20 MEQ tablet Take 20 mEq by mouth 2 (two) times daily.    [provider]  telmisartan (MICARDIS) 80 MG tablet Take 80 mg by mouth daily. 10/06/18   [provider]  traMADol (ULTRAM) 50 MG tablet Take 1 tablet (50 mg total) by mouth every 6 (six) hours as needed for up to 10 doses. 12/01/21   Duffy Bruce, MD  trolamine salicylate (ASPERCREME) 10 % cream Apply 1 application topically as needed  for muscle pain.    [provider]  vitamin B-12 (CYANOCOBALAMIN) 100 MCG tablet Take 100 mg by mouth 2 (two) times daily.    [provider]    Physical Exam: Vitals:   06/28/22 1500 06/28/22 1515 06/28/22 1545 06/28/22 1607  BP: (!) 180/83 (!) 181/84 (!) 177/80   Pulse: 91 (!) 107 96   Resp: '18 17 18   '$ Temp:    98.5 F (36.9 C)  TempSrc:    Axillary  SpO2: 97% 99% 94%   Weight:      Height:       General:  Appears calm and comfortable and is in NAD at rest but easily disturbed with conversation Eyes:  EOMI, normal lids, iris ENT:  grossly normal hearing, lips & tongue, mmm Neck:  no LAD, masses or thyromegaly Cardiovascular:  RR with mild tachycardia, no m/r/g. No LE edema.  Respiratory:   CTA bilaterally with no wheezes/rales/rhonchi.  Mildly increased respiratory effort. Abdomen:  soft, NT, ND Skin:  no rash or induration seen on limited exam Musculoskeletal:  grossly normal tone BUE/BLE, no bony abnormality Psychiatric:  anxious/avoidant mood and affect, speech sparse but appropriate, AOx3 Neurologic:  CN 2-12 grossly intact, moves all extremities in coordinated fashion   Radiological Exams on Admission: Independently reviewed - see discussion in A/P where applicable  CT Head Wo Contrast  Result Date: 06/28/2022 CLINICAL DATA:  Head trauma, minor (Age >= 65y); Neck trauma (Age >= 65y). Fall. EXAM: CT HEAD WITHOUT CONTRAST CT CERVICAL SPINE WITHOUT CONTRAST TECHNIQUE: Multidetector CT imaging of the head and cervical spine was performed following the standard protocol without intravenous contrast. Multiplanar CT image reconstructions of the cervical spine were also generated. RADIATION DOSE REDUCTION: This exam was performed according to the departmental dose-optimization program which includes automated exposure control, adjustment of the mA and/or kV according to patient size and/or use of iterative reconstruction technique. COMPARISON:  CT head 06/19/2022  FINDINGS: CT HEAD FINDINGS Brain: There is no evidence of an acute infarct, intracranial hemorrhage, mass, midline shift, or extra-axial fluid collection. The ventricles and sulci are within normal limits for age. Patchy hypodensities in the cerebral white matter bilaterally are unchanged and nonspecific but compatible with moderate chronic small vessel ischemic disease. 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 Vascular: Calcified atherosclerosis at the skull base. No hyperdense vessel. Skull: No fracture or suspicious osseous lesion. Sinuses/Orbits: Paranasal sinuses and mastoid air cells are clear. Unremarkable orbits. Other: None. CT CERVICAL SPINE FINDINGS Alignment: No evidence of traumatic subluxation. Skull base and vertebrae: No acute fracture. Mild nonspecific heterogeneous density of the cervical spine diffusely. No destructive bone lesion. Soft tissues and spinal canal: No prevertebral fluid or swelling. No visible canal hematoma. Disc levels: Cervical spondylosis with multilevel disc bulging and ligamentum flavum calcification. A central disc protrusion at C3-4 contributes to severe spinal stenosis. Upper chest: Clear lung apices. Other: None. IMPRESSION: 1. No evidence of acute intracranial abnormality. 2. Moderate chronic small vessel ischemic disease. 3. No evidence of acute cervical spine fracture. 4. 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 Electronically Signed   By: Logan Bores M.D.   On: 06/28/2022 13:35   CT Cervical Spine Wo Contrast  Result Date: 06/28/2022 CLINICAL DATA:  Head trauma, minor (Age >= 65y); Neck trauma (Age >= 65y). Fall. EXAM: CT HEAD WITHOUT CONTRAST CT CERVICAL SPINE WITHOUT CONTRAST TECHNIQUE: Multidetector CT imaging of the head and cervical spine was performed following the standard protocol without intravenous contrast. Multiplanar CT image reconstructions of the cervical spine were also generated. RADIATION DOSE  REDUCTION: This exam was performed according to the departmental dose-optimization program which includes automated exposure control, adjustment of the mA and/or kV according to patient size and/or use of iterative reconstruction technique. COMPARISON:  CT head 06/19/2022 FINDINGS: CT HEAD FINDINGS Brain: There is no evidence of an acute infarct, intracranial hemorrhage, mass, midline shift, or extra-axial fluid collection. The ventricles and sulci are within normal limits for age. Patchy hypodensities in the cerebral white matter bilaterally are unchanged and nonspecific but compatible with moderate chronic small vessel ischemic disease. 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 Vascular: Calcified atherosclerosis at the skull base. No hyperdense vessel. Skull: No fracture or suspicious osseous lesion. Sinuses/Orbits: Paranasal sinuses and mastoid air cells are clear. Unremarkable orbits. Other: None. CT CERVICAL SPINE FINDINGS Alignment: No evidence  of traumatic subluxation. Skull base and vertebrae: No acute fracture. Mild nonspecific heterogeneous density of the cervical spine diffusely. No destructive bone lesion. Soft tissues and spinal canal: No prevertebral fluid or swelling. No visible canal hematoma. Disc levels: Cervical spondylosis with multilevel disc bulging and ligamentum flavum calcification. A central disc protrusion at C3-4 contributes to severe spinal stenosis. Upper chest: Clear lung apices. Other: None. IMPRESSION: 1. No evidence of acute intracranial abnormality. 2. Moderate chronic small vessel ischemic disease. 3. No evidence of acute cervical spine fracture. 4. 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 Electronically Signed   By: Logan Bores M.D.   On: 06/28/2022 13:35   DG Shoulder Left  Result Date: 06/28/2022 CLINICAL DATA:  Provided history: Fall. EXAM: LEFT SHOULDER - 2+ VIEW COMPARISON:  Report from radiographs of the left shoulder  04/29/2022 (images unavailable). FINDINGS: There is normal bony alignment. No evidence of acute osseous or articular abnormality. Chronic irregularity at the superior aspect of the bony glenoid, which may reflect a loose body or fragmented osteophyte. Mild-to-moderate degenerative changes of the acromioclavicular joint. IMPRESSION: No evidence of acute osseous or articular abnormality. Chronic irregularity at the superior aspect of the bony glenoid, which may reflect a loose body or fragmented osteophyte. Mild-to-moderate acromioclavicular joint degenerative changes. Electronically Signed   By: Kellie Simmering D.O.   On: 06/28/2022 12:17   DG Lumbar Spine Complete  Result Date: 06/28/2022 CLINICAL DATA:  Fall EXAM: LUMBAR SPINE - COMPLETE 4+ VIEW COMPARISON:  CT 06/18/2022 FINDINGS: Five lumbar type vertebral segments. Vertebral body heights and alignment are maintained. No fracture identified. Intervertebral disc spaces are relatively preserved. Minimal degenerative endplate changes. Mild lower lumbar facet arthrosis. Abdominal aortic atherosclerotic calcification. IMPRESSION: No acute fracture or static listhesis of the lumbar spine. Electronically Signed   By: Davina Poke D.O.   On: 06/28/2022 12:10   DG Pelvis 1-2 Views  Result Date: 06/28/2022 CLINICAL DATA:  Status post fall, unable to answer questions EXAM: PELVIS - 1-2 VIEW COMPARISON:  None Available. FINDINGS: There is no evidence of pelvic fracture or diastasis. Tiny marginal osteophytes of bilateral femoral heads. Mild osteoarthritis of the right hip. No pelvic bone lesions are seen. IMPRESSION: 1. No acute osseous injury of bilateral hips. Electronically Signed   By: Kathreen Devoid M.D.   On: 06/28/2022 11:04   DG Chest 2 View  Result Date: 06/28/2022 CLINICAL DATA:  Trauma, fall, confusion EXAM: CHEST - 2 VIEW COMPARISON:  12/01/2021 FINDINGS: Cardiac size is within normal limits. Lung fields are clear of any infiltrate or pulmonary edema.  There is no pleural effusion or pneumothorax. IMPRESSION: No active cardiopulmonary disease. Electronically Signed   By: Elmer Picker M.D.   On: 06/28/2022 11:02    EKG: Independently reviewed. Sinus tachycardia with rate 102; nonspecific ST changes with no evidence of acute ischemia   Labs on Admission: I have personally reviewed the available labs and imaging studies at the time of the admission.  Pertinent labs:    Na++ 129; 134 on 7/16 K+ 2.7 Glucose 115 BUN 20/Creatinine 2/GFR 25; 13/0.92/>60 on 7/16 WBC 18.3   Assessment and Plan: Principal Problem:   AKI (acute kidney injury) ([place]) Active Problems:   Hyponatremia   CKD (chronic kidney disease), stage IIIa   Hypothyroidism   HLD (hyperlipidemia)   Depression with anxiety   History of TIA (transient ischemic attack)   HTN (hypertension)   Hypokalemia   Chronic diastolic CHF (congestive heart failure) ([place])   DNR (do  not resuscitate)    AKI -Baseline creatinine <1, GFR about 60, stage 3a CKD -Current creatinine is 2 with GFR 25 -Likely related to poor PO intake and recurrent n/v in the setting of worsening anxiety and dementia -Will admit, monitor on telemetry  Hyponatremia/hypokalemia -Recurrent, likely hypovolemic -IVF hydration -Recheck BMP in AM   Depression with anxiety -Continue Prozac and prn Ativan -Also with concerns for dementia with behavioral disturbance -Son no longer feels like he can provide effective care at home based on her increasing needs -prn IM hydroxyzine added -Delirium precautions -prn Haldol also ordered -PT/OT/ST (cognitive/language)/TOC team consults for probable placement   Hypothyroidism -Continue Synthroid   HLD (hyperlipidemia) -Continue Lipitor  History of TIA (transient ischemic attack) -Continue Aspirin, Lipitor   HTN (hypertension) -Continue home clonidine, oral hydralazine, Toprol XL -hold Micardis due to hyponatremia and AKI, consider dc -IV hydralazine  as needed    Chronic diastolic CHF (congestive heart failure) ([place]) -Echo on 07/29/2021 showed EF of 55-60% with grade 2 diastolic dysfunction -Appears to be compensated  DNR -I have discussed code status with the patient's son and the patient would not desire resuscitation and would prefer to die a natural death should that situation arise. -She will need a gold out of facility DNR form at the time of discharge      Advance Care Planning:   Code Status: DNR   Consults: PT/OT/ST; Nutrition; TOC team  DVT Prophylaxis: Lovenox  Family Communication: Son was present throughout evaluation  Severity of Illness: The appropriate patient status for this patient is INPATIENT. Inpatient status is judged to be reasonable and necessary in order to provide the required intensity of service to ensure the patient's safety. The patient's presenting symptoms, physical exam findings, and initial radiographic and laboratory data in the context of their chronic comorbidities is felt to place them at high risk for further clinical deterioration. Furthermore, it is not anticipated that the patient will be medically stable for discharge from the hospital within 2 midnights of admission.   * I certify that at the point of admission it is my clinical judgment that the patient will require inpatient hospital care spanning beyond 2 midnights from the point of admission due to high intensity of service, high risk for further deterioration and high frequency of surveillance required.*  Author: Karmen Bongo, MD 06/28/2022 6:49 PM  For on call review [email or web address].

## 2022-06-28 NOTE — ED Triage Notes (Signed)
Pt from home BIB [facility] after having a mechanical fall out of her chair. Complains of lower back pain. Pt placed in c-collar. Per EMS family has concerns that patient is having increasing anxiety and forgetfulness, intermittent periods of AMS, concerned about patient having dementia. Pt recently evaluated at [facility] for UTI, c/o urinary frequency.   BP-148/90 HR-90-120  CBG-110 98% on RA.

## 2022-06-28 NOTE — ED Provider Notes (Signed)
[facility] EMERGENCY DEPARTMENT Provider Note   CSN: [id number] Arrival date & time: 06/28/22  1015     History  Chief Complaint  Patient presents with   Cindy Huerta is a 76 y.o. female.   Fall  Patient brought in by son for fall.  Reportedly fell out of bed or a chair.  Most of the history comes through the son patient is very anxious.  States she hurts in her back and left shoulder.  Not on blood thinners.  Recent admission to the hospital for mental status changes thought to be secondary to hyponatremia.  More history comes from the son and states that she has been vomiting for 2 days now.  Decreased oral intake.  Does get very anxious which is gotten worse.  Think she may have some underlying dementia.    Past Medical History:  Diagnosis Date   Arthritis    Breast cancer ([place])    Depression    GERD (gastroesophageal reflux disease)    HLD (hyperlipidemia)    Hypertension    Hypothyroidism     Home Medications Prior to Admission medications   Medication Sig Start Date End Date Taking? Authorizing Provider  acetaminophen (TYLENOL) 500 MG tablet Take 500-1,000 mg by mouth every 6 (six) hours as needed for moderate pain.    [provider]  alum & mag hydroxide-simeth (MAALOX/MYLANTA) 200-200-20 MG/5ML suspension Take 30 mLs by mouth every 6 (six) hours as needed for indigestion or heartburn.    [provider]  ARTIFICIAL TEAR SOLUTION OP Place 1 drop into both eyes daily as needed (dry eyes).    [provider]  aspirin 81 MG EC tablet Take 1 tablet (81 mg total) by mouth daily. RESTART 48HRS AFTER DISCHARGE 11/25/21   Lysle Pearl, Isami, DO  atorvastatin (LIPITOR) 80 MG tablet Take 80 mg by mouth daily. 09/21/21   [provider]  cloNIDine (CATAPRES) 0.1 MG tablet Take 0.1 mg by mouth 2 (two) times daily. 05/16/22   [provider]  FLUoxetine (PROZAC) 10 MG capsule Take 10 mg by mouth daily. 11/13/21    [provider]  fluticasone (FLONASE) 50 MCG/ACT nasal spray Place 1 spray into both nostrils daily as needed for allergies or rhinitis.    [provider]  hydrALAZINE (APRESOLINE) 25 MG tablet Take 25 mg by mouth 3 (three) times daily. 02/04/21   [provider]  levothyroxine (SYNTHROID) 75 MCG tablet Take 75 mcg by mouth daily before breakfast. 02/10/21   [provider]  LORazepam (ATIVAN) 0.5 MG tablet Take 1 tablet by mouth daily. 06/11/22   [provider]  magnesium oxide (MAG-OX) 400 MG tablet Take 400 mg by mouth daily.    [provider]  meclizine (ANTIVERT) 12.5 MG tablet Take 12.5 mg by mouth 3 (three) times daily as needed for dizziness.    [provider]  metoCLOPramide (REGLAN) 10 MG tablet Take 1 tablet (10 mg total) by mouth 3 (three) times daily with meals for 10 days. 01/13/22 01/23/22  Rada Hay, MD  metoprolol succinate (TOPROL-XL) 50 MG 24 hr tablet Take 50 mg by mouth 2 (two) times daily. 03/18/21   [provider]  Multiple Vitamins-Minerals (THERA-M) TABS Take 1 tablet by mouth daily.    [provider]  pantoprazole (PROTONIX) 40 MG tablet Take 1 tablet (40 mg total) by mouth daily. 10/10/21 06/18/22  Sidney Ace, MD  potassium chloride SA (KLOR-CON) 20  MEQ tablet Take 20 mEq by mouth 2 (two) times daily.    [provider]  telmisartan (MICARDIS) 80 MG tablet Take 80 mg by mouth daily. 10/06/18   [provider]  traMADol (ULTRAM) 50 MG tablet Take 1 tablet (50 mg total) by mouth every 6 (six) hours as needed for up to 10 doses. 12/01/21   Duffy Bruce, MD  trolamine salicylate (ASPERCREME) 10 % cream Apply 1 application topically as needed for muscle pain.    [provider]  vitamin B-12 (CYANOCOBALAMIN) 100 MCG tablet Take 100 mg by mouth 2 (two) times daily.    [provider]      Allergies    Latex, Lisinopril, Procaine, Diphenhydramine  hcl, Clarithromycin, and Oxycodone    Review of Systems   Review of Systems  Physical Exam Updated Vital Signs BP (!) 161/127   Pulse (!) 101   Temp 98.7 F (37.1 C) (Rectal)   Resp (!) 30   Ht '5\' 6"'$  (1.676 m)   Wt 71 kg   SpO2 100%   BMI 25.26 kg/m  Physical Exam Vitals and nursing note reviewed.  HENT:     Head: Normocephalic and atraumatic.  Cardiovascular:     Rate and Rhythm: Tachycardia present.  Chest:     Chest wall: No tenderness.  Abdominal:     Tenderness: There is no abdominal tenderness.  Musculoskeletal:     Cervical back: Neck supple.     Comments: Mild lumbar tenderness and tenderness to left proximal humerus.  No deformity to either.  Neurological:     Mental Status: She is alert.  Psychiatric:     Comments: Patient appears anxious.     ED Results / Procedures / Treatments   Labs (all labs ordered are listed, but only abnormal results are displayed) Labs Reviewed  CBC WITH DIFFERENTIAL/PLATELET - Abnormal; Notable for the following components:      Result Value   WBC 18.3 (*)    Hemoglobin 15.1 (*)    Neutro Abs 14.8 (*)    Monocytes Absolute 1.8 (*)    Abs Immature Granulocytes 0.16 (*)    All other components within normal limits  BASIC METABOLIC PANEL - Abnormal; Notable for the following components:   Sodium 129 (*)    Potassium 2.7 (*)    Chloride 85 (*)    CO2 21 (*)    Glucose, Bld 115 (*)    Creatinine, Ser 2.00 (*)    GFR, Estimated 25 (*)    Anion gap 23 (*)    All other components within normal limits  URINE CULTURE  URINALYSIS, ROUTINE W REFLEX MICROSCOPIC    EKG None  Radiology CT Head Wo Contrast  Result Date: 06/28/2022 CLINICAL DATA:  Head trauma, minor (Age >= 65y); Neck trauma (Age >= 65y). Fall. EXAM: CT HEAD WITHOUT CONTRAST CT CERVICAL SPINE WITHOUT CONTRAST TECHNIQUE: Multidetector CT imaging of the head and cervical spine was performed following the standard protocol without intravenous contrast. Multiplanar  CT image reconstructions of the cervical spine were also generated. RADIATION DOSE REDUCTION: This exam was performed according to the departmental dose-optimization program which includes automated exposure control, adjustment of the mA and/or kV according to patient size and/or use of iterative reconstruction technique. COMPARISON:  CT head 06/19/2022 FINDINGS: CT HEAD FINDINGS Brain: There is no evidence of an acute infarct, intracranial hemorrhage, mass, midline shift, or extra-axial fluid collection. The ventricles and sulci are within normal limits for age. Patchy hypodensities in the  cerebral white matter bilaterally are unchanged and nonspecific but compatible with moderate chronic small vessel ischemic disease. 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 Vascular: Calcified atherosclerosis at the skull base. No hyperdense vessel. Skull: No fracture or suspicious osseous lesion. Sinuses/Orbits: Paranasal sinuses and mastoid air cells are clear. Unremarkable orbits. Other: None. CT CERVICAL SPINE FINDINGS Alignment: No evidence of traumatic subluxation. Skull base and vertebrae: No acute fracture. Mild nonspecific heterogeneous density of the cervical spine diffusely. No destructive bone lesion. Soft tissues and spinal canal: No prevertebral fluid or swelling. No visible canal hematoma. Disc levels: Cervical spondylosis with multilevel disc bulging and ligamentum flavum calcification. A central disc protrusion at C3-4 contributes to severe spinal stenosis. Upper chest: Clear lung apices. Other: None. IMPRESSION: 1. No evidence of acute intracranial abnormality. 2. Moderate chronic small vessel ischemic disease. 3. No evidence of acute cervical spine fracture. 4. 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 Electronically Signed   By: Logan Bores M.D.   On: 06/28/2022 13:35   CT Cervical Spine Wo Contrast  Result Date: 06/28/2022 CLINICAL DATA:  Head trauma, minor  (Age >= 65y); Neck trauma (Age >= 65y). Fall. EXAM: CT HEAD WITHOUT CONTRAST CT CERVICAL SPINE WITHOUT CONTRAST TECHNIQUE: Multidetector CT imaging of the head and cervical spine was performed following the standard protocol without intravenous contrast. Multiplanar CT image reconstructions of the cervical spine were also generated. RADIATION DOSE REDUCTION: This exam was performed according to the departmental dose-optimization program which includes automated exposure control, adjustment of the mA and/or kV according to patient size and/or use of iterative reconstruction technique. COMPARISON:  CT head 06/19/2022 FINDINGS: CT HEAD FINDINGS Brain: There is no evidence of an acute infarct, intracranial hemorrhage, mass, midline shift, or extra-axial fluid collection. The ventricles and sulci are within normal limits for age. Patchy hypodensities in the cerebral white matter bilaterally are unchanged and nonspecific but compatible with moderate chronic small vessel ischemic disease. 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 Vascular: Calcified atherosclerosis at the skull base. No hyperdense vessel. Skull: No fracture or suspicious osseous lesion. Sinuses/Orbits: Paranasal sinuses and mastoid air cells are clear. Unremarkable orbits. Other: None. CT CERVICAL SPINE FINDINGS Alignment: No evidence of traumatic subluxation. Skull base and vertebrae: No acute fracture. Mild nonspecific heterogeneous density of the cervical spine diffusely. No destructive bone lesion. Soft tissues and spinal canal: No prevertebral fluid or swelling. No visible canal hematoma. Disc levels: Cervical spondylosis with multilevel disc bulging and ligamentum flavum calcification. A central disc protrusion at C3-4 contributes to severe spinal stenosis. Upper chest: Clear lung apices. Other: None. IMPRESSION: 1. No evidence of acute intracranial abnormality. 2. Moderate chronic small vessel ischemic disease. 3. No  evidence of acute cervical spine fracture. 4. 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 Electronically Signed   By: Logan Bores M.D.   On: 06/28/2022 13:35   DG Shoulder Left  Result Date: 06/28/2022 CLINICAL DATA:  Provided history: Fall. EXAM: LEFT SHOULDER - 2+ VIEW COMPARISON:  Report from radiographs of the left shoulder 04/29/2022 (images unavailable). FINDINGS: There is normal bony alignment. No evidence of acute osseous or articular abnormality. Chronic irregularity at the superior aspect of the bony glenoid, which may reflect a loose body or fragmented osteophyte. Mild-to-moderate degenerative changes of the acromioclavicular joint. IMPRESSION: No evidence of acute osseous or articular abnormality. Chronic irregularity at the superior aspect of the bony glenoid, which may reflect a loose body or fragmented osteophyte.  Mild-to-moderate acromioclavicular joint degenerative changes. Electronically Signed   By: Kellie Simmering D.O.   On: 06/28/2022 12:17   DG Lumbar Spine Complete  Result Date: 06/28/2022 CLINICAL DATA:  Fall EXAM: LUMBAR SPINE - COMPLETE 4+ VIEW COMPARISON:  CT 06/18/2022 FINDINGS: Five lumbar type vertebral segments. Vertebral body heights and alignment are maintained. No fracture identified. Intervertebral disc spaces are relatively preserved. Minimal degenerative endplate changes. Mild lower lumbar facet arthrosis. Abdominal aortic atherosclerotic calcification. IMPRESSION: No acute fracture or static listhesis of the lumbar spine. Electronically Signed   By: Davina Poke D.O.   On: 06/28/2022 12:10   DG Pelvis 1-2 Views  Result Date: 06/28/2022 CLINICAL DATA:  Status post fall, unable to answer questions EXAM: PELVIS - 1-2 VIEW COMPARISON:  None Available. FINDINGS: There is no evidence of pelvic fracture or diastasis. Tiny marginal osteophytes of bilateral femoral heads. Mild osteoarthritis of the right hip. No pelvic bone lesions are seen. IMPRESSION: 1. No  acute osseous injury of bilateral hips. Electronically Signed   By: Kathreen Devoid M.D.   On: 06/28/2022 11:04   DG Chest 2 View  Result Date: 06/28/2022 CLINICAL DATA:  Trauma, fall, confusion EXAM: CHEST - 2 VIEW COMPARISON:  12/01/2021 FINDINGS: Cardiac size is within normal limits. Lung fields are clear of any infiltrate or pulmonary edema. There is no pleural effusion or pneumothorax. IMPRESSION: No active cardiopulmonary disease. Electronically Signed   By: Elmer Picker M.D.   On: 06/28/2022 11:02    Procedures Procedures    Medications Ordered in ED Medications  potassium chloride 10 mEq in 100 mL IVPB (10 mEq Intravenous New Bag/Given 06/28/22 1410)  LORazepam (ATIVAN) injection 0.5 mg (0.5 mg Intravenous Given 06/28/22 1252)  lactated ringers bolus 1,000 mL (1,000 mLs Intravenous New Bag/Given 06/28/22 1413)  ondansetron (ZOFRAN) injection 4 mg (4 mg Intravenous Given 06/28/22 1409)    ED Course/ Medical Decision Making/ A&P                           Medical Decision Making Amount and/or Complexity of Data Reviewed Radiology: ordered.  Risk Prescription drug management.   Patient with fall.  Mental status change.  Has had mental status change recently.  Reportedly has underlying anxiety potentially dementia.  Appears somewhat anxious here.  Chest x-ray reassuring.  Does have proximal left humerus tenderness but negative x-ray.  Lumbar spine x-ray also reassuring.  We will get basic blood work and urine sample  Blood work reviewed and shows worsening hyponatremia.  Also acute kidney injury.  I think is likely secondary to her decreased oral intake due to nausea and vomiting.  Urinalysis still pending but recent urine was negative culture.  With the worsening confusion and decreased oral intake along with electrolyte abnormalities I feel she would benefit from admission to the hospital.  Fluid boluses given but as of now is not urinated.  Will discuss with hospitalist for  admission        Final Clinical Impression(s) / ED Diagnoses Final diagnoses:  Dehydration  AKI (acute kidney injury) ([place])  Hypokalemia  Hyponatremia    Rx / DC Orders ED Discharge Orders     None         Davonna Belling, MD 06/28/22 1429

## 2022-06-28 NOTE — ED Notes (Addendum)
Bladder scanned patient, 340cc noted. Patient unable to void, does report pain on lower abdomen during palpation.   Admitting provider notified. Awaiting new orders.

## 2022-06-28 NOTE — ED Provider Triage Note (Cosign Needed Addendum)
Emergency Medicine Provider Triage Evaluation Note  Cindy Huerta , a 76 y.o. female  was evaluated in triage.  Pt complains of fall.  Patient was at home, fell out of her chair today.  Unsure if she hit her head, unsure about loss of consciousness.  She presents by EMS, they report family has been very worried about altered mental status and intermittent episodes of forgetfulness..  Recently admitted at [facility] for UTI.  Not on blood thinners.  Review of Systems  Per HPI  Physical Exam  Ht '5\' 6"'$  (1.676 m)   Wt 71 kg   BMI 25.26 kg/m  Gen:   Awake, anxious Resp:  Tachypneic. MSK:   Moves extremities without difficulty Other:  Tremulous.  Patient is in c-collar, grip strength equal bilaterally.  I do not see any contusions or lacerations or abrasions to chest wall or abdomen although limited exam given patient has not dressed down.  Regularly the patient is tachycardic.  Medical Decision Making  Medically screening exam initiated at 10:27 AM.  Appropriate orders placed.  Cindy Huerta was informed that the remainder of the evaluation will be completed by another provider, this initial triage assessment does not replace that evaluation, and the importance of remaining in the ED until their evaluation is complete.  Mechanical fall with associated AMS    Sherrill Raring, PA-C 06/28/22 1029    Sherrill Raring, [place] 06/28/22 1029

## 2022-06-29 DIAGNOSIS — I5032 Chronic diastolic (congestive) heart failure: Secondary | ICD-10-CM | POA: Diagnosis not present

## 2022-06-29 DIAGNOSIS — E860 Dehydration: Secondary | ICD-10-CM | POA: Diagnosis not present

## 2022-06-29 DIAGNOSIS — N179 Acute kidney failure, unspecified: Secondary | ICD-10-CM | POA: Diagnosis not present

## 2022-06-29 DIAGNOSIS — E871 Hypo-osmolality and hyponatremia: Secondary | ICD-10-CM

## 2022-06-29 DIAGNOSIS — R338 Other retention of urine: Secondary | ICD-10-CM

## 2022-06-29 LAB — BASIC METABOLIC PANEL
Anion gap: 12 (ref 5–15)
Anion gap: 13 (ref 5–15)
BUN: 24 mg/dL — ABNORMAL HIGH (ref 8–23)
BUN: 25 mg/dL — ABNORMAL HIGH (ref 8–23)
CO2: 26 mmol/L (ref 22–32)
CO2: 24 mmol/L (ref 22–32)
Calcium: 8.9 mg/dL (ref 8.9–10.3)
Calcium: 9.2 mg/dL (ref 8.9–10.3)
Chloride: 89 mmol/L — ABNORMAL LOW (ref 98–111)
Chloride: 92 mmol/L — ABNORMAL LOW (ref 98–111)
Creatinine, Ser: 1.72 mg/dL — ABNORMAL HIGH (ref 0.44–1.00)
Creatinine, Ser: 1.65 mg/dL — ABNORMAL HIGH (ref 0.44–1.00)
GFR, Estimated: 30 mL/min — ABNORMAL LOW (ref >60)
GFR, Estimated: 32 mL/min — ABNORMAL LOW (ref >60)
Glucose, Bld: 110 mg/dL — ABNORMAL HIGH (ref 70–99)
Glucose, Bld: 89 mg/dL (ref 70–99)
Potassium: 2.9 mmol/L — ABNORMAL LOW (ref 3.5–5.1)
Potassium: 2.9 mmol/L — ABNORMAL LOW (ref 3.5–5.1)
Sodium: 127 mmol/L — ABNORMAL LOW (ref 135–145)
Sodium: 129 mmol/L — ABNORMAL LOW (ref 135–145)

## 2022-06-29 LAB — CBC
HCT: 34.3 % — ABNORMAL LOW (ref 36.0–46.0)
Hemoglobin: 12.2 g/dL (ref 12.0–15.0)
MCH: 30.3 pg (ref 26.0–34.0)
MCHC: 35.6 g/dL (ref 30.0–36.0)
MCV: 85.1 fL (ref 80.0–100.0)
Platelets: 231 10*3/uL (ref 150–400)
RBC: 4.03 MIL/uL (ref 3.87–5.11)
RDW: 13 % (ref 11.5–15.5)
WBC: 14.6 10*3/uL — ABNORMAL HIGH (ref 4.0–10.5)
nRBC: 0 % (ref 0.0–0.2)

## 2022-06-29 MED ORDER — SODIUM CHLORIDE 0.9 % IV SOLN: INTRAVENOUS | Status: DC

## 2022-06-29 MED ORDER — ADULT MULTIVITAMIN W/MINERALS CH
1.0000 | ORAL_TABLET | Freq: Every day | ORAL | Status: DC
  Administered 2022-06-29 – 2022-07-01 (×3): 1 via ORAL
  Filled 2022-06-29 (×3): qty 1

## 2022-06-29 MED ORDER — ENSURE ENLIVE PO LIQD: 237.0000 mL | Freq: Two times a day (BID) | ORAL | Status: DC

## 2022-06-29 MED ORDER — POTASSIUM CHLORIDE 20 MEQ PO PACK
40.0000 meq | PACK | ORAL | Status: AC
  Administered 2022-06-29 (×2): 40 meq via ORAL
  Filled 2022-06-29 (×2): qty 2

## 2022-06-29 MED ORDER — ENSURE ENLIVE PO LIQD
237.0000 mL | Freq: Three times a day (TID) | ORAL | Status: DC
  Administered 2022-06-29 – 2022-07-01 (×6): 237 mL via ORAL

## 2022-06-29 NOTE — Evaluation (Addendum)
Physical Therapy Evaluation Patient Details Name: Cindy Huerta MRN: [id number] DOB: [birth year] Today's Date: 06/29/2022  History of Present Illness  Pt is a 76 y.o. female with medical history significant of HTN; HLD; anxiety/depression; stage 3b CKD; GI bleeding; chronic GI bleeding; and hyponatremia presenting with a mechanical fall. She was last admitted at [facility] from 7/14-16 with symptomatic hyponatremia. She had significant anxiety/panic during the hospitalization and family is now expressing concern about dementia. He reports that she has significant anxiety and when she gets anxious she won't eat or drink and has dry heaves. He is concerned about underlying dementia and thinks that she needs to be placed.  Clinical Impression  Pt agreeable to physical therapy evaluation session. Pt not reliable historian and will have to obtain information from family at later time. Pt's session limited to bed mobility only due to vertigo, high anxiety, and nausea while sitting EOB. Will continue to assess for discharge recommendations. Pt currently presents with functional limitations secondary to impairments listed in PT problem list. Pt to benefit from skilled, acute care physical therapy interventions to maximize her strength, function, and quality of life.       Recommendations for follow up therapy are one component of a multi-disciplinary discharge planning process, led by the attending physician.  Recommendations may be updated based on patient status, additional functional criteria and insurance authorization.  Follow Up Recommendations Other (comment) (pending further mobility assessment and pt progress) Can patient physically be transported by private vehicle:  (will continue to assess)    Assistance Recommended at Discharge  (will continue to assess)  Patient can return home with the following   (will continue to assess)    Equipment Recommendations Other (comment) (will continue to assess)   Recommendations for Other Services       Functional Status Assessment Patient has had a recent decline in their functional status and demonstrates the ability to make significant improvements in function in a reasonable and predictable amount of time.     Precautions / Restrictions Precautions Precautions: Fall Precaution Comments: monitor for dizziness/vertigo Restrictions Weight Bearing Restrictions: No      Mobility  Bed Mobility Overal bed mobility: Needs Assistance Bed Mobility: Supine to Sit, Sit to Supine     Supine to sit: Mod assist, HOB elevated Sit to supine: Mod assist, HOB elevated   General bed mobility comments: Pt able to perform first supine to sit transfer with mostly standby A and use of bed rail but then required moderate assistance with use of bed pad to scoot towards EOB. Pt was maintaining static sitting balance but began to experience vertigo (no nystagmus present) and nausea which caused her to flop back into supine/sidelying position in bed. Pt performed additional supine <> sit transfer with moderate assistance but deferred sit to stand due to pt's anxiety and continued symptoms.    Transfers                   General transfer comment: unable to attempt    Ambulation/Gait                  Stairs            Wheelchair Mobility    Modified Rankin (Stroke Patients Only)       Balance Overall balance assessment: Needs assistance Sitting-balance support: Feet supported, No upper extremity supported, Single extremity supported, Bilateral upper extremity supported Sitting balance-Leahy Scale: Fair Sitting balance - Comments: Pt was able to sit at  EOB with and without UE use and with standby A initially but as symptoms increased she required physical assistance to maintain static sitting balance.                                     Pertinent Vitals/Pain Pain Assessment Pain Assessment: No/denies pain     Home Living Family/patient expects to be discharged to:: Private residence Living Arrangements: Children (Lives with son and his wife) Available Help at Discharge: Family;Available 24 hours/day (Pt's son and DTR in law work from home) Type of Home: House Home Access: Stairs to enter Entrance Stairs-Rails: None Entrance Stairs-Number of Steps: 2 Alternate Level Stairs-Number of Steps: 1/2 flight + landing + 1/2 flight Home Layout: Two level;Bed/bath upstairs Home Equipment: Conservation officer, nature (2 wheels);Cane - quad      Prior Function Prior Level of Function : Independent/Modified Independent;History of Falls (last six months) (Pt reports driving a few weeks ago when going to [facility])             Mobility Comments: Pt reports she would drive to/from the [facility] because it was close to her house, goes to the [facility] 5x/week (states she did not go recently due to heat outside). Pt has been using RW ADLs Comments: Pt states she is independent with bathing and dressing. Pt reports minimal cooking (mostly microwave).     Hand Dominance   Dominant Hand: Right    Extremity/Trunk Assessment   Upper Extremity Assessment Upper Extremity Assessment: Defer to OT evaluation    Lower Extremity Assessment Lower Extremity Assessment: Generalized weakness       Communication   Communication: Other (comment) (pt soft spoken and has difficulty answering questions directly)  Cognition Arousal/Alertness: Awake/alert Behavior During Therapy: Anxious Overall Cognitive Status: No family/caregiver present to determine baseline cognitive functioning                                 General Comments: Pt with dififculty answering questions directly and appears confused. Pt with lack of sustained attention. Pt able to detect time on analog clock with cues (not wearing her glasses). Pt very anxious.        General Comments General comments (skin integrity, edema, etc.): 99% SpO2 and HR 72 on  2L O2; BP 131/70    Exercises     Assessment/Plan    PT Assessment Patient needs continued PT services  PT Problem List Decreased strength;Decreased mobility;Decreased safety awareness;Decreased balance;Decreased activity tolerance;Decreased knowledge of use of DME;Decreased cognition;Decreased knowledge of precautions       PT Treatment Interventions DME instruction;Therapeutic exercise;Gait training;Balance training;Stair training;Neuromuscular re-education;Functional mobility training;Therapeutic activities;Patient/family education;Cognitive remediation    PT Goals (Current goals can be found in the Care Plan section)  Acute Rehab PT Goals Patient Stated Goal: none stated PT Goal Formulation: With patient Time For Goal Achievement: 07/13/22 Potential to Achieve Goals:  (guarded)    Frequency Min 3X/week     Co-evaluation PT/OT/SLP Co-Evaluation/Treatment: Yes Reason for Co-Treatment: Complexity of the patient's impairments (multi-system involvement);For patient/therapist safety;To address functional/ADL transfers PT goals addressed during session: Mobility/safety with mobility;Balance         AM-PAC PT "6 Clicks" Mobility  Outcome Measure Help needed turning from your back to your side while in a flat bed without using bedrails?: A Lot Help needed moving from lying on your back to sitting  on the side of a flat bed without using bedrails?: A Lot Help needed moving to and from a bed to a chair (including a wheelchair)?: A Lot Help needed standing up from a chair using your arms (e.g., wheelchair or bedside chair)?: A Lot Help needed to walk in hospital room?: Total Help needed climbing 3-5 steps with a railing? : Total 6 Click Score: 10    End of Session Equipment Utilized During Treatment: Oxygen Activity Tolerance: Other (comment) (Pt limited by anxiety, vertigo, and nausea.) Patient left: in bed;with call bell/phone within reach;with bed alarm set Nurse  Communication: Precautions PT Visit Diagnosis: Muscle weakness (generalized) (M62.81);Other abnormalities of gait and mobility (R26.89);Dizziness and giddiness (R42)    Time: 7939-0300 PT Time Calculation (min) (ACUTE ONLY): 40 min   Charges:   PT Evaluation $PT Eval Moderate Complexity: 1 Mod          Donna Bernard, PT   [facility] 06/29/2022, 11:28 AM

## 2022-06-29 NOTE — Progress Notes (Signed)
Initial Nutrition Assessment  DOCUMENTATION CODES:   Non-severe (moderate) malnutrition in context of social or environmental circumstances  INTERVENTION:  Recommend advancing to full liquids and then to a DYS3 diet once dentures are at hospital Ensure Enlive po BID, each supplement provides 350 kcal and 20 grams of protein. MVI with minerals daily  NUTRITION DIAGNOSIS:  Moderate Malnutrition (in the context of social/environmental circumstances) related to poor appetite as evidenced by mild muscle depletion, mild fat depletion.  GOAL:  Patient will meet greater than or equal to 90% of their needs  MONITOR:  PO intake, Supplement acceptance  REASON FOR ASSESSMENT:  Consult Assessment of nutrition requirement/status  ASSESSMENT:  Pt with hx of HTN, HLD, CKD3, hx breast cancer, and dementia presented to ED with poor PO intake with nausea and vomiting.  Pt resting in bed at the time of visit. Clear liquid tray at bedside well consumed. Pt denies GI distress or pain after eating. Inquired about ability to consume solid foods and pt reports that she felt like eating, but that her son needed to bring her dentures. Will ask MD about upgrading to full liquids to provide more options until dentures are in place.   Pt seems a little confused. Conversation jumped to several topics in a short span of time. Noted a 9% weight loss noted in the last 6 months which is concerning due to advanced age and reported poor PO.  Nutritionally Relevant Medications: Scheduled Meds:  atorvastatin  80 mg Oral Daily   docusate sodium  100 mg Oral BID   pantoprazole  40 mg Oral BID   Continuous Infusions:  sodium chloride 75 mL/hr at 06/29/22 0850   PRN Meds: bisacodyl, ondansetron, polyethylene glycol  Labs Reviewed: Na 127, chloride 89 K 2.9 BUN 24, creatinine 1.72  NUTRITION - FOCUSED PHYSICAL EXAM: Flowsheet Row Most Recent Value  Orbital Region Mild depletion  Upper Arm Region No depletion   Thoracic and Lumbar Region No depletion  Buccal Region Mild depletion  Temple Region Mild depletion  Clavicle Bone Region Moderate depletion  Clavicle and Acromion Bone Region Mild depletion  Scapular Bone Region No depletion  Dorsal Hand Mild depletion  Patellar Region No depletion  Anterior Thigh Region No depletion  Posterior Calf Region No depletion  Edema (RD Assessment) Mild  Hair Reviewed  Eyes Reviewed  Mouth Reviewed  Skin Reviewed  Nails Reviewed       Diet Order:   Diet Order             Diet full liquid Room service appropriate? Yes; Fluid consistency: Thin  Diet effective now                   EDUCATION NEEDS:  Not appropriate for education at this time  Skin:  Skin Assessment: Reviewed RN Assessment  Last BM:  unsure  Height:  Ht Readings from Last 1 Encounters:  06/28/22 '5\' 6"'$  (1.676 m)    Weight:  Wt Readings from Last 1 Encounters:  06/28/22 71 kg    Ideal Body Weight:  59.1 kg  BMI:  Body mass index is 25.26 kg/m.  Estimated Nutritional Needs:  Kcal:  1500-1700 kcal/d Protein:  75-90g/d Fluid:  >/=1.5L/d    Ranell Patrick, RD, LDN Clinical Dietitian RD pager # available in [facility]  After hours/weekend pager # available in [facility]

## 2022-06-29 NOTE — TOC CM/SW Note (Signed)
Consult for home health, DME, SNF   Await PT/OT evaluations.    Chart review patient from home with son, [facility] for HHPT/OT.  Confirmed with Anderson Malta with [facility] she was admitted with them 06/25/22 . Anderson Malta aware of patient's admission.

## 2022-06-29 NOTE — Plan of Care (Signed)

## 2022-06-29 NOTE — Progress Notes (Addendum)
PROGRESS NOTE    Cindy Huerta  KDT:[id number] DOB: [birth year] DOA: 06/28/2022 PCP: Juluis Pitch, MD   Brief Narrative: Cindy Huerta is a 77 y.o. female with a history of hypertension, hyperlipidemia, anxiety, depression, CKD stage IIIb, chronic GI bleeding, hyponatremia. Patient presented after fall with concern for possible dementia. Patient found to have an AKI and hyponatremia.   Assessment and Plan:  AKI on CKD stage IIIb Baseline creatinine appears to be around 1. Creatinine of 2 on admission. Likely secondary to poor oral intake and ACEi use. Patient started on LR IV fluids. -Normal saline IV -Trend BMP  Hyponatremia Likely secondary to poor oral intake. Complicated by Prozac use. Patient with recent history of hyponatremia. Sodium of 129 on admission with mild decrease to 127. Patient managed on LR fluids on admission. -Switch to NS IV fluids -BMP this afternoon and tomorrow morning; more frequently if sodium decreasing significantly -May need to consider discontinuing Prozac if patient has recurrent hyponatremia with adequate oral intake or if patient has worsening hyponatremia concerning for SIADH. -Obtain urine sodium and osmolality  Hypokalemia Potassium of 2.7 on admission. Supplementation with mild improvement to 2.9 -Continued potassium supplementation  Hypothyroidism -Continue Synthroid  Hyperlipidemia -Continue Lipitor  Primary hypertension Telmesartan held secondary to AKI. -Continue home clonidine, hydralazine, Toprol XL  Acute urinary retention Possibly related to patient being in bed. Patient required in/out catheterization overnight with 600 mL evacuated. Discussed with nursing to try and have patient urinate with the bedside commode. If recurrent retention, patient may need foley catheter.  Chronic diastolic heart failure Patient currently compensated. On IV fluids for management of hyponatremia.  DVT prophylaxis: Lovenox Code Status:    Code Status: DNR Family Communication: None at bedside. Called son but no answer. Disposition Plan: Discharge pending PT/OT recommendations in addition to improvement of hyponatremia and AKI.   Consultants:  None  Procedures:  None  Antimicrobials: None    Subjective: Patient reports issues with urinating. She thinks she'll be able to urinate if she sits on the commode rather than using the [place]. No other concerns.  Objective: BP (!) 143/71 (BP Location: Left Arm)   Pulse 69   Temp 98 F (36.7 C) (Oral)   Resp 16   Ht '5\' 6"'$  (1.676 m)   Wt 71 kg   SpO2 100%   BMI 25.26 kg/m   Examination:  General exam: Appears calm and comfortable Respiratory system: Clear to auscultation. Respiratory effort normal. Cardiovascular system: S1 & S2 heard, RRR. No murmurs, rubs, gallops or clicks. Gastrointestinal system: Abdomen is nondistended, soft and nontender. No organomegaly or masses felt. Normal bowel sounds heard. Central nervous system: Alert and oriented to person, place, time/day and situation. No focal neurological deficits. Musculoskeletal: No edema. No calf tenderness Skin: No cyanosis. No rashes Psychiatry: Judgement and insight appear normal. Mood & affect appropriate.    Data Reviewed: I have personally reviewed following labs and imaging studies  CBC Lab Results  Component Value Date   WBC 14.6 (H) 06/29/2022   RBC 4.03 06/29/2022   HGB 12.2 06/29/2022   HCT 34.3 (L) 06/29/2022   MCV 85.1 06/29/2022   MCH 30.3 06/29/2022   PLT 231 06/29/2022   MCHC 35.6 06/29/2022   RDW 13.0 06/29/2022   LYMPHSABS 1.5 06/28/2022   MONOABS 1.8 (H) 06/28/2022   EOSABS 0.0 06/28/2022   BASOSABS 0.0 99/83/3825     Last metabolic panel Lab Results  Component Value Date   NA 127 (L) 06/29/2022  K 2.9 (L) 06/29/2022   CL 89 (L) 06/29/2022   CO2 26 06/29/2022   BUN 24 (H) 06/29/2022   CREATININE 1.72 (H) 06/29/2022   GLUCOSE 110 (H) 06/29/2022   GFRNONAA 30 (L)  06/29/2022   CALCIUM 8.9 06/29/2022   PHOS 2.2 (L) 06/18/2022   PROT 8.5 (H) 06/18/2022   ALBUMIN 4.5 06/18/2022   BILITOT 0.8 06/18/2022   ALKPHOS 55 06/18/2022   AST 30 06/18/2022   ALT 18 06/18/2022   ANIONGAP 12 06/29/2022    GFR: Estimated Creatinine Clearance: 25.2 mL/min (A) (by C-G formula based on SCr of 1.72 mg/dL (H)).  No results found for this or any previous visit (from the past 240 hour(s)).    Radiology Studies: CT Head Wo Contrast  Result Date: 06/28/2022 CLINICAL DATA:  Head trauma, minor (Age >= 65y); Neck trauma (Age >= 65y). Fall. EXAM: CT HEAD WITHOUT CONTRAST CT CERVICAL SPINE WITHOUT CONTRAST TECHNIQUE: Multidetector CT imaging of the head and cervical spine was performed following the standard protocol without intravenous contrast. Multiplanar CT image reconstructions of the cervical spine were also generated. RADIATION DOSE REDUCTION: This exam was performed according to the departmental dose-optimization program which includes automated exposure control, adjustment of the mA and/or kV according to patient size and/or use of iterative reconstruction technique. COMPARISON:  CT head 06/19/2022 FINDINGS: CT HEAD FINDINGS Brain: There is no evidence of an acute infarct, intracranial hemorrhage, mass, midline shift, or extra-axial fluid collection. The ventricles and sulci are within normal limits for age. Patchy hypodensities in the cerebral white matter bilaterally are unchanged and nonspecific but compatible with moderate chronic small vessel ischemic disease. 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 Vascular: Calcified atherosclerosis at the skull base. No hyperdense vessel. Skull: No fracture or suspicious osseous lesion. Sinuses/Orbits: Paranasal sinuses and mastoid air cells are clear. Unremarkable orbits. Other: None. CT CERVICAL SPINE FINDINGS Alignment: No evidence of traumatic subluxation. Skull base and vertebrae: No acute fracture.  Mild nonspecific heterogeneous density of the cervical spine diffusely. No destructive bone lesion. Soft tissues and spinal canal: No prevertebral fluid or swelling. No visible canal hematoma. Disc levels: Cervical spondylosis with multilevel disc bulging and ligamentum flavum calcification. A central disc protrusion at C3-4 contributes to severe spinal stenosis. Upper chest: Clear lung apices. Other: None. IMPRESSION: 1. No evidence of acute intracranial abnormality. 2. Moderate chronic small vessel ischemic disease. 3. No evidence of acute cervical spine fracture. 4. 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 Electronically Signed   By: Logan Bores M.D.   On: 06/28/2022 13:35   CT Cervical Spine Wo Contrast  Result Date: 06/28/2022 CLINICAL DATA:  Head trauma, minor (Age >= 65y); Neck trauma (Age >= 65y). Fall. EXAM: CT HEAD WITHOUT CONTRAST CT CERVICAL SPINE WITHOUT CONTRAST TECHNIQUE: Multidetector CT imaging of the head and cervical spine was performed following the standard protocol without intravenous contrast. Multiplanar CT image reconstructions of the cervical spine were also generated. RADIATION DOSE REDUCTION: This exam was performed according to the departmental dose-optimization program which includes automated exposure control, adjustment of the mA and/or kV according to patient size and/or use of iterative reconstruction technique. COMPARISON:  CT head 06/19/2022 FINDINGS: CT HEAD FINDINGS Brain: There is no evidence of an acute infarct, intracranial hemorrhage, mass, midline shift, or extra-axial fluid collection. The ventricles and sulci are within normal limits for age. Patchy hypodensities in the cerebral white matter bilaterally are unchanged and nonspecific but compatible with moderate chronic small vessel  ischemic disease. 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 Vascular: Calcified atherosclerosis at the skull base. No hyperdense vessel. Skull:  No fracture or suspicious osseous lesion. Sinuses/Orbits: Paranasal sinuses and mastoid air cells are clear. Unremarkable orbits. Other: None. CT CERVICAL SPINE FINDINGS Alignment: No evidence of traumatic subluxation. Skull base and vertebrae: No acute fracture. Mild nonspecific heterogeneous density of the cervical spine diffusely. No destructive bone lesion. Soft tissues and spinal canal: No prevertebral fluid or swelling. No visible canal hematoma. Disc levels: Cervical spondylosis with multilevel disc bulging and ligamentum flavum calcification. A central disc protrusion at C3-4 contributes to severe spinal stenosis. Upper chest: Clear lung apices. Other: None. IMPRESSION: 1. No evidence of acute intracranial abnormality. 2. Moderate chronic small vessel ischemic disease. 3. No evidence of acute cervical spine fracture. 4. 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 Electronically Signed   By: Logan Bores M.D.   On: 06/28/2022 13:35   DG Shoulder Left  Result Date: 06/28/2022 CLINICAL DATA:  Provided history: Fall. EXAM: LEFT SHOULDER - 2+ VIEW COMPARISON:  Report from radiographs of the left shoulder 04/29/2022 (images unavailable). FINDINGS: There is normal bony alignment. No evidence of acute osseous or articular abnormality. Chronic irregularity at the superior aspect of the bony glenoid, which may reflect a loose body or fragmented osteophyte. Mild-to-moderate degenerative changes of the acromioclavicular joint. IMPRESSION: No evidence of acute osseous or articular abnormality. Chronic irregularity at the superior aspect of the bony glenoid, which may reflect a loose body or fragmented osteophyte. Mild-to-moderate acromioclavicular joint degenerative changes. Electronically Signed   By: Kellie Simmering D.O.   On: 06/28/2022 12:17   DG Lumbar Spine Complete  Result Date: 06/28/2022 CLINICAL DATA:  Fall EXAM: LUMBAR SPINE - COMPLETE 4+ VIEW COMPARISON:  CT 06/18/2022 FINDINGS: Five lumbar  type vertebral segments. Vertebral body heights and alignment are maintained. No fracture identified. Intervertebral disc spaces are relatively preserved. Minimal degenerative endplate changes. Mild lower lumbar facet arthrosis. Abdominal aortic atherosclerotic calcification. IMPRESSION: No acute fracture or static listhesis of the lumbar spine. Electronically Signed   By: Davina Poke D.O.   On: 06/28/2022 12:10   DG Pelvis 1-2 Views  Result Date: 06/28/2022 CLINICAL DATA:  Status post fall, unable to answer questions EXAM: PELVIS - 1-2 VIEW COMPARISON:  None Available. FINDINGS: There is no evidence of pelvic fracture or diastasis. Tiny marginal osteophytes of bilateral femoral heads. Mild osteoarthritis of the right hip. No pelvic bone lesions are seen. IMPRESSION: 1. No acute osseous injury of bilateral hips. Electronically Signed   By: Kathreen Devoid M.D.   On: 06/28/2022 11:04   DG Chest 2 View  Result Date: 06/28/2022 CLINICAL DATA:  Trauma, fall, confusion EXAM: CHEST - 2 VIEW COMPARISON:  12/01/2021 FINDINGS: Cardiac size is within normal limits. Lung fields are clear of any infiltrate or pulmonary edema. There is no pleural effusion or pneumothorax. IMPRESSION: No active cardiopulmonary disease. Electronically Signed   By: Elmer Picker M.D.   On: 06/28/2022 11:02      LOS: 1 day    Cordelia Poche, MD [facility] 06/29/2022, 3:10 PM   If 7PM-7AM, please contact night-coverage [email or web address]

## 2022-06-29 NOTE — Evaluation (Signed)
 Occupational Therapy Evaluation Patient Details Name: Cindy Huerta MRN: [id number] DOB: [birth year] Today's Date: 06/29/2022   History of Present Illness Pt is a 76 y.o. female presenting with a mechanical fall. She was last admitted at [facility] from 7/14-16 with symptomatic hyponatremia. Family expressing concerns about possible underlying dementia. PMH: HTN; HLD; anxiety/depression; stage 3b CKD; GI bleeding; chronic GI bleeding; and hyponatremia   Clinical Impression   PTA, pt from home with family, unsure of PLOF accuracy d/t pt poor historian. Session limited to EOB only d/t sudden onset of dizziness sitting EOB (x 2 with pt impulsively/quickly returning to supine). Session also limited by nausea, dry heaving and anxious behaviors throughout. Pt requires Mod A for UB ADL and Max-Total A for LB ADLs d/t deficits with cognition and anxiety appearing to be most limiting factors. Pt also noted with inconsistent L UE function with further assessment needed in functional context. Per chart review, family with difficulty caring for pt currently and interested in SNF rehab at [place].      Recommendations for follow up therapy are one component of a multi-disciplinary discharge planning process, led by the attending physician.  Recommendations may be updated based on patient status, additional functional criteria and insurance authorization.   Follow Up Recommendations  Skilled nursing-short term rehab (<3 hours/day)    Assistance Recommended at Discharge Frequent or constant Supervision/Assistance  Patient can return home with the following A lot of help with walking and/or transfers;A lot of help with bathing/dressing/bathroom;Assistance with feeding;Direct supervision/assist for medications management;Direct supervision/assist for financial management    Functional Status Assessment  Patient has had a recent decline in their functional status and demonstrates the ability to make significant  improvements in function in a reasonable and predictable amount of time.  Equipment Recommendations  None recommended by OT    Recommendations for Other Services       Precautions / Restrictions Precautions Precautions: Fall Precaution Comments: monitor for dizziness/vertigo Restrictions Weight Bearing Restrictions: No      Mobility Bed Mobility Overal bed mobility: Needs Assistance Bed Mobility: Supine to Sit, Sit to Supine     Supine to sit: Mod assist, HOB elevated Sit to supine: Mod assist, HOB elevated   General bed mobility comments: Pt able to perform first supine to sit transfer with mostly standby A and use of bed rail but then required moderate assistance with use of bed pad to scoot towards EOB. Pt was maintaining static sitting balance but began to experience vertigo (no nystagmus present) and nausea which caused her to flop back into supine/sidelying position in bed. Pt performed additional supine <> sit transfer with moderate assistance with LUE handheld assist but deferred sit to stand due to pt's anxiety and continued symptoms.    Transfers                   General transfer comment: unable to attempt      Balance Overall balance assessment: Needs assistance Sitting-balance support: Feet supported, No upper extremity supported, Single extremity supported, Bilateral upper extremity supported Sitting balance-Leahy Scale: Fair Sitting balance - Comments: Pt was able to sit at EOB with and without UE use and with standby A initially but as symptoms increased she required physical assistance to maintain static sitting balance.                                   ADL either performed or assessed  with clinical judgement   ADL Overall ADL's : Needs assistance/impaired Eating/Feeding: Minimal assistance;Bed level Eating/Feeding Details (indicate cue type and reason): assist to grasp cup and bring to mouth, cues for sequencing. able to hold with R  hand Grooming: Moderate assistance;Bed level;Sitting   Upper Body Bathing: Moderate assistance;Sitting   Lower Body Bathing: Maximal assistance;Sitting/lateral leans;Bed level   Upper Body Dressing : Moderate assistance;Sitting   Lower Body Dressing: Maximal assistance;Sitting/lateral leans;Bed level       Toileting- Clothing Manipulation and Hygiene: Total assistance;Bed level         General ADL Comments: Pt limited by anxiety, confusion and sudden reported onset of dizziness/room spinning when sitting EOB (occurred x2) with pt impulsively throwing self back into bed.     Vision Baseline Vision/History: 1 Wears glasses Ability to See in Adequate Light: 0 Adequate Patient Visual Report: No change from baseline Vision Assessment?: No apparent visual deficits Additional Comments: does report room spinning, no nystagmus noted     Perception     Praxis      Pertinent Vitals/Pain Pain Assessment Pain Assessment: No/denies pain     Hand Dominance Right   Extremity/Trunk Assessment Upper Extremity Assessment Upper Extremity Assessment: Generalized weakness;LUE deficits/detail LUE Deficits / Details: pt able to use L UE to lift trunk to EOB but at end of session when expressing desire for water, pt reports unable to use L UE to reach for cup ?   Lower Extremity Assessment Lower Extremity Assessment: Defer to PT evaluation   Cervical / Trunk Assessment Cervical / Trunk Assessment: Normal   Communication Communication Communication: Other (comment) (pt soft spoken and has difficulty answering questions directly)   Cognition Arousal/Alertness: Awake/alert Behavior During Therapy: Anxious, Flat affect Overall Cognitive Status: No family/caregiver present to determine baseline cognitive functioning                                 General Comments: Pt with dififculty answering questions directly and appears confused. Pt with lack of sustained attention. Pt  very anxious with variable reports/observations of functional abilities. multimodal cues and repetition to follow commands approx 75% of the time     General Comments  99% SpO2 and HR 72 on 2L O2; BP 131/70. pt began dry heaving during session though no vomiting noted - provided small blue bag and educated to use if symptoms return    Exercises     Shoulder Instructions      Home Living Family/patient expects to be discharged to:: Private residence Living Arrangements: Children (Lives with son and his wife) Available Help at Discharge: Family;Available 24 hours/day (Pt's son and DTR in law work from home) Type of Home: House Home Access: Stairs to enter Technical  of Steps: 2 Entrance Stairs-Rails: None Home Layout: Two level;Bed/bath upstairs Alternate Level Stairs-Number of Steps: 1/2 flight + landing + 1/2 flight Alternate Level Stairs-Rails: Left;Right;Can reach both Bathroom Shower/Tub: Walk-in shower         Home Equipment: Conservation officer, nature (2 wheels);Cane - quad          Prior Functioning/Environment Prior Level of Function : Independent/Modified Independent;History of Falls (last six months);Patient poor historian/Family not available (Pt reports driving a few weeks ago when going to [facility])             Mobility Comments: Pt reports she would drive to/from the [facility] because it was close to her house, goes to the [facility] 5x/week (  states she did not go recently due to heat outside). Pt has been using RW ADLs Comments: Pt states she is independent with bathing and dressing. Pt reports minimal cooking (mostly microwave).        OT Problem List: Decreased strength;Decreased safety awareness;Impaired balance (sitting and/or standing);Decreased activity tolerance;Decreased cognition      OT Treatment/Interventions: Self-care/ADL training;Balance training;Patient/family education;Neuromuscular education;Therapeutic exercise;DME and/or AE instruction;Therapeutic  activities    OT Goals(Current goals can be found in the care plan section) Acute Rehab OT Goals Patient Stated Goal: reported desire for something to drink OT Goal Formulation: With patient Time For Goal Achievement: 07/13/22 Potential to Achieve Goals: Good ADL Goals Pt Will Perform Grooming: with supervision;sitting Pt Will Perform Lower Body Dressing: with min assist;sit to/from stand Pt Will Transfer to Toilet: with min assist;ambulating Additional ADL Goal #1: Pt to complete Short Blessed test for further functional cognitive assessment  OT Frequency: Min 2X/week    Co-evaluation PT/OT/SLP Co-Evaluation/Treatment: Yes Reason for Co-Treatment: Complexity of the patient's impairments (multi-system involvement);For patient/therapist safety;Necessary to address cognition/behavior during functional activity PT goals addressed during session: Mobility/safety with mobility;Balance OT goals addressed during session: ADL's and self-care;Strengthening/ROM      AM-PAC OT "6 Clicks" Daily Activity     Outcome Measure Help from another person eating meals?: A Little Help from another person taking care of personal grooming?: A Lot Help from another person toileting, which includes using toliet, bedpan, or urinal?: Total Help from another person bathing (including washing, rinsing, drying)?: A Lot Help from another person to put on and taking off regular upper body clothing?: A Lot Help from another person to put on and taking off regular lower body clothing?: A Lot 6 Click Score: 12   End of Session Equipment Utilized During Treatment: Oxygen Nurse Communication: Mobility status (discussed with NT)  Activity Tolerance: Treatment limited secondary to medical complications (Comment) Patient left: in bed;with call bell/phone within reach;with bed alarm set  OT Visit Diagnosis: Unsteadiness on feet (R26.81);Muscle weakness (generalized) (M62.81);History of falling (Z91.81);Other symptoms  and signs involving cognitive function;Dizziness and giddiness (R42)                Time: 0175-1025 OT Time Calculation (min): 25 min Charges:  OT General Charges $OT Visit: 1 Visit OT Evaluation $OT Eval Moderate Complexity: 1 Mod  Malachy Chamber, OTR/L Acute Rehab Services Office: [phone]   Layla Maw 06/29/2022, 11:59 AM

## 2022-06-30 DIAGNOSIS — F418 Other specified anxiety disorders: Secondary | ICD-10-CM

## 2022-06-30 DIAGNOSIS — R338 Other retention of urine: Secondary | ICD-10-CM | POA: Diagnosis not present

## 2022-06-30 DIAGNOSIS — N179 Acute kidney failure, unspecified: Secondary | ICD-10-CM | POA: Diagnosis not present

## 2022-06-30 DIAGNOSIS — I5032 Chronic diastolic (congestive) heart failure: Secondary | ICD-10-CM | POA: Diagnosis not present

## 2022-06-30 DIAGNOSIS — E440 Moderate protein-calorie malnutrition: Secondary | ICD-10-CM | POA: Insufficient documentation

## 2022-06-30 DIAGNOSIS — E860 Dehydration: Secondary | ICD-10-CM | POA: Diagnosis not present

## 2022-06-30 DIAGNOSIS — N1831 Chronic kidney disease, stage 3a: Secondary | ICD-10-CM

## 2022-06-30 DIAGNOSIS — Z8673 Personal history of transient ischemic attack (TIA), and cerebral infarction without residual deficits: Secondary | ICD-10-CM

## 2022-06-30 LAB — URINALYSIS, ROUTINE W REFLEX MICROSCOPIC
Bilirubin Urine: NEGATIVE
Glucose, UA: NEGATIVE mg/dL
Hgb urine dipstick: NEGATIVE
Ketones, ur: NEGATIVE mg/dL
Leukocytes,Ua: NEGATIVE
Nitrite: NEGATIVE
Protein, ur: NEGATIVE mg/dL
Specific Gravity, Urine: 1.009 (ref 1.005–1.030)
pH: 5 (ref 5.0–8.0)

## 2022-06-30 LAB — BASIC METABOLIC PANEL
Anion gap: 8 (ref 5–15)
BUN: 23 mg/dL (ref 8–23)
CO2: 24 mmol/L (ref 22–32)
Calcium: 8.3 mg/dL — ABNORMAL LOW (ref 8.9–10.3)
Chloride: 96 mmol/L — ABNORMAL LOW (ref 98–111)
Creatinine, Ser: 1.46 mg/dL — ABNORMAL HIGH (ref 0.44–1.00)
GFR, Estimated: 37 mL/min — ABNORMAL LOW (ref >60)
Glucose, Bld: 89 mg/dL (ref 70–99)
Potassium: 3.1 mmol/L — ABNORMAL LOW (ref 3.5–5.1)
Sodium: 128 mmol/L — ABNORMAL LOW (ref 135–145)

## 2022-06-30 LAB — SODIUM, URINE, RANDOM: Sodium, Ur: 50 mmol/L

## 2022-06-30 LAB — MAGNESIUM: Magnesium: 1.6 mg/dL — ABNORMAL LOW (ref 1.7–2.4)

## 2022-06-30 LAB — OSMOLALITY, URINE: Osmolality, Ur: 344 mOsm/kg (ref 300–900)

## 2022-06-30 MED ORDER — POTASSIUM CHLORIDE 20 MEQ PO PACK
40.0000 meq | PACK | Freq: Two times a day (BID) | ORAL | Status: AC
Start: 2022-06-30 — End: 2022-06-30
  Administered 2022-06-30 (×2): 40 meq via ORAL
  Filled 2022-06-30 (×2): qty 2

## 2022-06-30 MED ORDER — MAGNESIUM SULFATE 4 GM/100ML IV SOLN
4.0000 g | Freq: Once | INTRAVENOUS | Status: AC
  Administered 2022-06-30: 4 g via INTRAVENOUS
  Filled 2022-06-30: qty 100

## 2022-06-30 NOTE — Assessment & Plan Note (Signed)
-   Continue finasteride - Avoid anticholinergics

## 2022-06-30 NOTE — Assessment & Plan Note (Signed)
Resolved with supplementation and starting spironolactone. 

## 2022-06-30 NOTE — Progress Notes (Signed)
Visited  briefly with patient per Son request.  Staff walking patient .  Provided prayer. Will follow as needed.   Jaclynn Major, [place], [facility], Pager [phone]

## 2022-06-30 NOTE — Hospital Course (Addendum)
Cindy Huerta is a 76 y.o. F with HTN, anxiety, panic attacks,  CKD stage IIIb, chronic GI bleeding, and hyponatremia who presented after a fall.    In the ER, found to have an AKI and hyponatremia.

## 2022-06-30 NOTE — Assessment & Plan Note (Signed)
As evidenced by mild weight loss, moderate loss of subcutaneous muscle mass, poor PO intake.

## 2022-06-30 NOTE — Assessment & Plan Note (Addendum)
Creatinine imProving

## 2022-06-30 NOTE — Assessment & Plan Note (Signed)
-   Hold Prozac - Continue Ativan

## 2022-06-30 NOTE — Assessment & Plan Note (Signed)
Baseline Cr 2 mg/dL.  On fluids here, Cr slightly better, down to 1.4, UOP good. - Continue IV fluids - Hold nephrotoxins

## 2022-06-30 NOTE — Assessment & Plan Note (Signed)
Blood pressure controlled - Continue clonidine, hydralazine, metoprolol

## 2022-06-30 NOTE — Assessment & Plan Note (Signed)
Appears euvolemic to dehydrated - Hold telmisartan

## 2022-06-30 NOTE — Progress Notes (Signed)
Physical Therapy Treatment Patient Details Name: Cindy Huerta MRN: [id number] DOB: 11-29-1946 Today's Date: 06/30/2022   History of Present Illness Pt is a 76 y.o. female with medical history significant of HTN; HLD; anxiety/depression; stage 3b CKD; GI bleeding; chronic GI bleeding; and hyponatremia presenting with a mechanical fall. She was last admitted at [facility] from 7/14-16 with symptomatic hyponatremia. She had significant anxiety/panic during the hospitalization and family is now expressing concern about dementia. He reports that she has significant anxiety and when she gets anxious she won't eat or drink and has dry heaves. He is concerned about underlying dementia and thinks that she needs to be placed.    PT Comments    Pt instructed in and performed gait training and therapeutic exercises. Pt with R LE weakness and pain during exercises requiring pt to utilize her arms to assist [place]). Pt requiring contact guard assistance to minimal assistance during transfers and gait at this time but limited to 36' with RW due to poor endurance and LE weakness. Recommend SNF at this time unless family can provide constant supervision and required assistance level (if so, then home health PT recommended). Progress as tolerated.   Recommendations for follow up therapy are one component of a multi-disciplinary discharge planning process, led by the attending physician.  Recommendations may be updated based on patient status, additional functional criteria and insurance authorization.  Follow Up Recommendations  Skilled nursing-short term rehab (<3 hours/day) (unless 24/7 care at home) Can patient physically be transported by private vehicle: Yes   Assistance Recommended at Discharge Frequent or constant Supervision/Assistance  Patient can return home with the following Help with stairs or ramp for entrance;Direct supervision/assist for financial management;Direct supervision/assist for medications  management;Assistance with cooking/housework;A little help with bathing/dressing/bathroom;A little help with walking and/or transfers   Equipment Recommendations  BSC/3in1    Recommendations for Other Services       Precautions / Restrictions Precautions Precautions: Fall Precaution Comments: monitor for dizziness/vertigo Restrictions Weight Bearing Restrictions: No     Mobility  Bed Mobility Overal bed mobility: Needs Assistance Bed Mobility: Supine to Sit     Supine to sit: HOB elevated, Supervision     General bed mobility comments: Pt able to maintain static sitting balance well without assistance.    Transfers Overall transfer level: Needs assistance Equipment used: Rolling walker (2 wheels) Transfers: Sit to/from Stand, Bed to chair/wheelchair/BSC Sit to Stand: Min assist, Min guard   Step pivot transfers: Min guard       General transfer comment: Pt performed step pivot transfer from bed to chair prior to gait training. Pt required min assist to stand from chair but CGA utilized from bed.    Ambulation/Gait Ambulation/Gait assistance: Min guard, Min assist, +2 safety/equipment Gait Distance (Feet): 14 Feet Assistive device: Rolling walker (2 wheels) (chair follow) Gait Pattern/deviations: Decreased step length - right, Decreased step length - left Gait velocity: decreased Gait velocity interpretation: <1.31 ft/sec, indicative of household ambulator   General Gait Details: Pt reported bilateral LE fatigue which limited her distance traveled. Pt with no overt LOB.   Stairs             Wheelchair Mobility    Modified Rankin (Stroke Patients Only)       Balance Overall balance assessment: Needs assistance Sitting-balance support: No upper extremity supported Sitting balance-Leahy Scale: Fair  Cognition Arousal/Alertness: Awake/alert Behavior During Therapy: Anxious Overall Cognitive  Status: No family/caregiver present to determine baseline cognitive functioning                                 General Comments: Pt with dififculty answering questions directly and appears confused. Pt with lack of sustained attention. Follows most commands appropriately.        Exercises General Exercises - Lower Extremity Ankle Circles/Pumps: Both, 5 reps, Supine (limited ROM present bilaterally) Short Arc Quad: Right, Left, 10 reps (AAROM required on R) Hip ABduction/ADduction: Right, Left, 5 reps, Supine (AAROM required on R)    General Comments General comments (skin integrity, edema, etc.): 99% SpO2 and 60 HR on RA      Pertinent Vitals/Pain Pain Assessment Pain Assessment: No/denies pain    Home Living                          Prior Function            PT Goals (current goals can now be found in the care plan section) Acute Rehab PT Goals Patient Stated Goal: none stated PT Goal Formulation: With patient Time For Goal Achievement: 07/13/22 Potential to Achieve Goals:  (fairly good) Progress towards PT goals: Progressing toward goals    Frequency    Min 3X/week      PT Plan Current plan remains appropriate    Co-evaluation PT/OT/SLP Co-Evaluation/Treatment: Yes            AM-PAC PT "6 Clicks" Mobility   Outcome Measure  Help needed turning from your back to your side while in a flat bed without using bedrails?: A Little Help needed moving from lying on your back to sitting on the side of a flat bed without using bedrails?: A Little Help needed moving to and from a bed to a chair (including a wheelchair)?: A Little Help needed standing up from a chair using your arms (e.g., wheelchair or bedside chair)?: A Little Help needed to walk in hospital room?: A Lot Help needed climbing 3-5 steps with a railing? : A Lot 6 Click Score: 16    End of Session Equipment Utilized During Treatment: Gait belt Activity Tolerance: Patient  tolerated treatment well;Patient limited by fatigue Patient left: with call bell/phone within reach;in chair;with chair alarm set Nurse Communication: Precautions PT Visit Diagnosis: Muscle weakness (generalized) (M62.81);Other abnormalities of gait and mobility (R26.89);Dizziness and giddiness (R42)     Time: 0814-4818 PT Time Calculation (min) (ACUTE ONLY): 24 min  Charges:  $Gait Training: 8-22 mins $Therapeutic Exercise: 8-22 mins                     Donna Bernard, PT    [facility] 06/30/2022, 3:37 PM

## 2022-06-30 NOTE — Assessment & Plan Note (Signed)
Na no change - Hold SSRI - Continue IV fluids - Follow urine chemistries

## 2022-06-30 NOTE — Assessment & Plan Note (Signed)
Continue atorvastatin

## 2022-06-30 NOTE — Assessment & Plan Note (Signed)
Continue aspirin 

## 2022-06-30 NOTE — Progress Notes (Signed)
  Progress Note   Patient: Cindy Huerta WFU:[id number] DOB: Apr 28, 1946 DOA: 06/28/2022     2 DOS: the patient was seen and examined on 06/30/2022 at 11:22 AM      Brief hospital course: [name] is a 76 y.o. F with HTN, anxiety, panic attacks,  CKD stage IIIb, chronic GI bleeding, and hyponatremia who presented after a fall.    In the ER, found to have an AKI and hyponatremia.     Assessment and Plan: * AKI (acute kidney injury) ([facility]) Baseline Cr 2 mg/dL.  On fluids here, Cr slightly better, down to 1.4, UOP good. - Continue IV fluids - Hold nephrotoxins  Hyponatremia Na no change - Hold SSRI - Continue IV fluids - Follow urine chemistries  Acute urinary retention - Continue finasteride - Avoid anticholinergics  Hypokalemia - Supplement K  Malnutrition of moderate degree As evidenced by mild weight loss, moderate loss of subcutaneous muscle mass, poor PO intake.  DNR (do not resuscitate)    Chronic diastolic CHF (congestive heart failure) (HCC) Appears euvolemic to dehydrated - Hold telmisartan  HTN (hypertension) Blood pressure controlled - Continue clonidine, hydralazine, metoprolol  History of TIA (transient ischemic attack) - Continue aspirin  CKD (chronic kidney disease), stage IIIa Creatinine imProving  Depression with anxiety - Hold Prozac - Continue Ativan  Hypothyroidism - Continue levothyroxine  HLD (hyperlipidemia) - Continue atorvastatin          Subjective: Patient feels weak and tired, but she is oriented, feels like overall she is improving.  No headache, chest pain, dyspnea.  She is fatigue and cold intolerance.     Physical Exam: Vitals:   06/29/22 2121 06/30/22 0421 06/30/22 0833 06/30/22 1146  BP:  134/61 (!) 146/69 (!) 164/91  Pulse: 62 (!) 59 65 (!) 58  Resp:  '17 17 16  '$ Temp:  98.3 F (36.8 C) 98.4 F (36.9 C) 97.9 F (36.6 C)  TempSrc:  Oral Oral Oral  SpO2:  100% 95% 98%  Weight:      Height:        Elderly adult female, lying in bed, covered with blankets, no acute distress, interactive RRR, no murmurs, no peripheral edema Respiratory rate normal, lungs clear without rales or wheezes Abdomen soft without tenderness palpation or guarding, no ascites or distention Attention normal, affect appropriate, judgment and insight appear mostly normal, oriented to person, place, time, she has severe generalized weakness in all 4 extremities, 4/5 in all 4, speech fluent, short-term memory seems fairly good  Data Reviewed: Metabolic panel shows sodium 128, no change, potassium still 3.1, creatinine down to 1.46 from 1.65 CT of the head shows some cervical stenosis Chest x-ray clear  Family Communication: Son by phone    Disposition: Status is: Inpatient         Author: Edwin Dada, MD 06/30/2022 3:33 PM  For on call review [email or web address].

## 2022-06-30 NOTE — Assessment & Plan Note (Signed)
Continue levothyroxine 

## 2022-07-01 DIAGNOSIS — E860 Dehydration: Secondary | ICD-10-CM | POA: Diagnosis not present

## 2022-07-01 DIAGNOSIS — N179 Acute kidney failure, unspecified: Secondary | ICD-10-CM | POA: Diagnosis not present

## 2022-07-01 LAB — CBC
HCT: 29.5 % — ABNORMAL LOW (ref 36.0–46.0)
Hemoglobin: 10 g/dL — ABNORMAL LOW (ref 12.0–15.0)
MCH: 30.5 pg (ref 26.0–34.0)
MCHC: 33.9 g/dL (ref 30.0–36.0)
MCV: 89.9 fL (ref 80.0–100.0)
Platelets: 191 10*3/uL (ref 150–400)
RBC: 3.28 MIL/uL — ABNORMAL LOW (ref 3.87–5.11)
RDW: 12.9 % (ref 11.5–15.5)
WBC: 11 10*3/uL — ABNORMAL HIGH (ref 4.0–10.5)
nRBC: 0 % (ref 0.0–0.2)

## 2022-07-01 LAB — BASIC METABOLIC PANEL
Anion gap: 3 — ABNORMAL LOW (ref 5–15)
BUN: 15 mg/dL (ref 8–23)
CO2: 24 mmol/L (ref 22–32)
Calcium: 8.3 mg/dL — ABNORMAL LOW (ref 8.9–10.3)
Chloride: 105 mmol/L (ref 98–111)
Creatinine, Ser: 1.08 mg/dL — ABNORMAL HIGH (ref 0.44–1.00)
GFR, Estimated: 53 mL/min — ABNORMAL LOW (ref >60)
Glucose, Bld: 111 mg/dL — ABNORMAL HIGH (ref 70–99)
Potassium: 4.2 mmol/L (ref 3.5–5.1)
Sodium: 132 mmol/L — ABNORMAL LOW (ref 135–145)

## 2022-07-01 LAB — MAGNESIUM: Magnesium: 1.8 mg/dL (ref 1.7–2.4)

## 2022-07-01 LAB — URINE CULTURE: Culture: <10000 — AB

## 2022-07-01 LAB — TSH: TSH: 0.607 u[IU]/mL (ref 0.350–4.500)

## 2022-07-01 MED ORDER — ENSURE ENLIVE PO LIQD: 237.0000 mL | Freq: Three times a day (TID) | ORAL | 12 refills | Status: DC

## 2022-07-01 MED ORDER — LORAZEPAM 0.5 MG PO TABS: 0.5000 mg | ORAL_TABLET | Freq: Every day | ORAL | 0 refills | Status: DC

## 2022-07-01 MED ORDER — TRAMADOL HCL 50 MG PO TABS: 50.0000 mg | ORAL_TABLET | Freq: Four times a day (QID) | ORAL | 0 refills | Status: DC | PRN

## 2022-07-01 MED ORDER — ENOXAPARIN SODIUM 40 MG/0.4ML IJ SOSY: 40.0000 mg | PREFILLED_SYRINGE | INTRAMUSCULAR | Status: DC

## 2022-07-01 MED ORDER — DOCUSATE SODIUM 100 MG PO CAPS: 100.0000 mg | ORAL_CAPSULE | Freq: Two times a day (BID) | ORAL | 0 refills | Status: DC

## 2022-07-01 NOTE — Progress Notes (Addendum)
Physical Therapy Treatment Patient Details Name: Cindy Huerta MRN: [id number] DOB: 27-Mar-1946 Today's Date: 07/01/2022   History of Present Illness Pt is a 76 y.o. female with medical history significant of HTN; HLD; anxiety/depression; stage 3b CKD; GI bleeding; chronic GI bleeding; and hyponatremia presenting with a mechanical fall. She was last admitted at [facility] from 7/14-16 with symptomatic hyponatremia. She had significant anxiety/panic during the hospitalization and family is now expressing concern about dementia. He reports that she has significant anxiety and when she gets anxious she won't eat or drink and has dry heaves. He is concerned about underlying dementia and thinks that she needs to be placed.    PT Comments    Pt agreeable to treatment. Pt with confusion during session and required more than reasonable time to complete tasks. Pt able to progress fair with gait and increase distance slightly. Pt will continue to benefit from skilled, acute care physical therapy interventions to maximize her current level of function and progress towards established goals.    Recommendations for follow up therapy are one component of a multi-disciplinary discharge planning process, led by the attending physician.  Recommendations may be updated based on patient status, additional functional criteria and insurance authorization.  Follow Up Recommendations  Skilled nursing-short term rehab (<3 hours/day) (unless 24/7 care at home) Can patient physically be transported by private vehicle: Yes   Assistance Recommended at Discharge Frequent or constant Supervision/Assistance  Patient can return home with the following Help with stairs or ramp for entrance;Direct supervision/assist for financial management;Direct supervision/assist for medications management;Assistance with cooking/housework;A little help with bathing/dressing/bathroom;A little help with walking and/or transfers   Equipment  Recommendations  BSC/3in1    Recommendations for Other Services       Precautions / Restrictions Precautions Precautions: Fall Precaution Comments: monitor for dizziness/vertigo Restrictions Weight Bearing Restrictions: No     Mobility  Bed Mobility Overal bed mobility: Needs Assistance Bed Mobility: Supine to Sit     Supine to sit: HOB elevated, Supervision     General bed mobility comments: Pt able to maintain static sitting balance well without assistance.    Transfers Overall transfer level: Needs assistance Equipment used: Rolling walker (2 wheels) Transfers: Sit to/from Stand, Bed to chair/wheelchair/BSC Sit to Stand: Min assist, Min guard   Step pivot transfers: Min assist       General transfer comment: Pt performed step pivot transfer from chair to bed after gait training (pt not wanting to stay up in chair) Pt required min assist to stand from bed but CGA utilized from chair. Cues provided for safety awareness and sequencing.    Ambulation/Gait Ambulation/Gait assistance: Min guard, Min assist, +2 safety/equipment Gait Distance (Feet): 22 Feet (18 feet and 4 feet respectively) Assistive device: Rolling walker (2 wheels) (chair follow) Gait Pattern/deviations: Decreased step length - right, Decreased step length - left, Step-to pattern Gait velocity: decreased Gait velocity interpretation: <1.31 ft/sec, indicative of household ambulator   General Gait Details: Pt ambulated with lack of heel-toe sequence and unable to correct with cues. Pt required several standing rest breaks as she had freezing episodes. Pt with intermittent unsteadiness but no overt LOB.   Stairs             Wheelchair Mobility    Modified Rankin (Stroke Patients Only)       Balance Overall balance assessment: Needs assistance Sitting-balance support: No upper extremity supported Sitting balance-Leahy Scale: Fair  Cognition Arousal/Alertness: Awake/alert Behavior During Therapy: Anxious Overall Cognitive Status: No family/caregiver present to determine baseline cognitive functioning                                 General Comments: Pt with confusion and lack of sustained attention. Pt with inconsistent ability to follow commands and requires increased time to complete tasks. Pt does not make her wants/needs clear.        Exercises      General Comments General comments (skin integrity, edema, etc.): BP at 158/73 and HR at 65 prior to mobility      Pertinent Vitals/Pain Pain Assessment Pain Assessment: 0-10 Pain Score:  (pt unable to quantify) Pain Location: left Pain Descriptors / Indicators: Grimacing, Guarding Pain Intervention(s): Limited activity within patient's tolerance, Monitored during session    Home Living                          Prior Function            PT Goals (current goals can now be found in the care plan section) Acute Rehab PT Goals Patient Stated Goal: none stated PT Goal Formulation: With patient Time For Goal Achievement: 07/13/22 Potential to Achieve Goals:  (fairly good) Progress towards PT goals: Progressing toward goals    Frequency    Min 3X/week      PT Plan Current plan remains appropriate    Co-evaluation PT/OT/SLP Co-Evaluation/Treatment: Yes            AM-PAC PT "6 Clicks" Mobility   Outcome Measure  Help needed turning from your back to your side while in a flat bed without using bedrails?: A Little Help needed moving from lying on your back to sitting on the side of a flat bed without using bedrails?: A Little Help needed moving to and from a bed to a chair (including a wheelchair)?: A Little Help needed standing up from a chair using your arms (e.g., wheelchair or bedside chair)?: A Little Help needed to walk in hospital room?: A Lot Help needed climbing 3-5 steps with a railing? : A Lot 6 Click  Score: 16    End of Session Equipment Utilized During Treatment: Gait belt Activity Tolerance: Patient tolerated treatment well;Patient limited by fatigue Patient left: with call bell/phone within reach;in bed;with bed alarm set Nurse Communication: Mobility status PT Visit Diagnosis: Muscle weakness (generalized) (M62.81);Other abnormalities of gait and mobility (R26.89);Dizziness and giddiness (R42)     Time: 1010-1040 PT Time Calculation (min) (ACUTE ONLY): 30 min  Charges:  $Gait Training: 8-22 mins $Therapeutic Activity: 8-22 mins                     Donna Bernard, PT    [facility] 07/01/2022, 12:39 PM

## 2022-07-01 NOTE — TOC Initial Note (Addendum)
Transition of Care [facility]) - Initial/Assessment Note    Patient Details  Name: Cindy Huerta MRN: [id number] Date of Birth: 1946/07/15  Transition of [facility]) CM/SW Contact:    Tresa Endo Phone Number: 07/01/2022, 2:31 PM  Clinical Narrative:                 CSW received SNF consult. CSW met with pt at bedside. CSW introduced self and explained role at the hospital. Pt reports that PTA the pt lived at home with her son. PT reports pt is walking 17f with RW and gets confused.  CSW reviewed PT/OT recommendations for SNF. Pt reports being agreeable to SNF and [facility] Pt asked CSW to contact her son to speak about SNF.   [place] [facility] [place] [facility] spoke with pt son about placement. [facility].  [facility]   CSW will continue to follow.          Patient Goals and CMS Choice        Expected Discharge Plan and Services                                                Prior Living Arrangements/Services                       Activities of Daily Living      Permission Sought/Granted                  Emotional Assessment              Admission diagnosis:  Dehydration [E86.0] Hypokalemia [E87.6] Hyponatremia [E87.1] AKI (acute kidney injury) ([place] [N17.9] Patient Active Problem List   Diagnosis Date Noted   Malnutrition of moderate degree 06/30/2022   Acute urinary retention 06/29/2022   AKI (acute kidney injury) ([place] 06/28/2022   DNR (do not resuscitate) 06/28/2022   Hyponatremia 06/18/2022   Hypokalemia 06/18/2022   Chronic diastolic CHF (congestive heart failure) ([place] 06/18/2022   Dysuria 06/18/2022   HTN (hypertension) 11/24/2021   Normocytic anemia 11/24/2021   Vertigo 11/24/2021   Breast CA ([place] 11/22/2021   Breast cancer ([place] 11/20/2021    Intractable nausea and vomiting 10/09/2021   Weakness 10/07/2021   Carcinoma of overlapping sites of left breast in female, estrogen receptor positive ([place] 10/06/2021   Dizziness 10/06/2021   Rectal bleeding 10/06/2021   Nausea & vomiting 10/06/2021   GIB (gastrointestinal bleeding) 10/06/2021   Left-sided weakness 07/29/2021   HLD (hyperlipidemia) 07/29/2021   Hypothyroidism 07/29/2021   Left sided numbness 07/29/2021   Depression with anxiety    CKD (chronic kidney disease), stage IIIa    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   Epigastric pain 04/23/2021   GERD (gastroesophageal reflux disease) 04/23/2021   Benign essential hypertension 01/20/2021   History of breast cancer 01/20/2021   History of TIA (transient ischemic attack) 01/20/2021   Palpitations 01/20/2021   Malignant neoplasm of upper-outer quadrant of left female breast ([place] 12/06/2018   PCP:  BJuluis Pitch MD Pharmacy:   [id number] [place], [id number]: [phone]: [phone]  Social Determinants of Health (SDOH) Interventions    Readmission Risk Interventions    06/20/2022    1:01 PM 11/25/2021    1:15 PM 10/10/2021    1:24 PM  Readmission Risk Prevention Plan  Transportation Screening  Complete Complete  PCP or Specialist Appt within 5-7 Days   Complete  PCP or Specialist Appt within 3-5 Days Complete Complete   Home Care Screening   Complete  Medication Review (RN CM)   Complete  HRI or Home Care Consult  Complete   Social Work Consult for [place] Planning/Counseling Complete Complete   Palliative Care Screening Not Applicable Not Applicable   Medication Review Press photographer)  Complete

## 2022-07-01 NOTE — Progress Notes (Signed)
  Progress Note   Patient: Cindy Huerta CVE:[id number] DOB: Jun 11, 1946 DOA: 06/28/2022     3 DOS: the patient was seen and examined on 07/01/2022 at 10:10 AM      Brief hospital course: Cindy Huerta is a 76 y.o. F with HTN, anxiety, panic attacks,  CKD stage IIIb, chronic GI bleeding, and hyponatremia who presented after a fall.    In the ER, found to have an AKI and hyponatremia.     Assessment and Plan: * AKI (acute kidney injury) ([place]) At baseline, her Cr is around 0.9-1.1 mg/dL, but was doubled to 2 mg/dL on admission.  With fluids, Cr has improved back to baseline 1.1 today - Stop fluids - Hold nephrotoxins  Hyponatremia Na improved on fluids, urine chemistries suggest dehydration.  TSH normal. - Stop SSRI  Acute urinary retention Resolved - Continue finasteride - Avoid anticholinergics   Chronic diastolic CHF (congestive heart failure) (HCC) Appears euvolemic to dehydrated, not on diuretics at home. - Hold telmisartan  HTN (hypertension) Blood pressure controlled - Continue clonidine, hydralazine, metoprolol  History of TIA (transient ischemic attack) - Continue aspirin  Depression with anxiety - Hold Prozac - Continue Ativan  Hypothyroidism - Continue levothyroxine  HLD (hyperlipidemia) - Continue atorvastatin          Subjective: Her left arm is sore from falling on it, she still is overall weak, but feels that she is much better.  No headache, chest pain, dyspnea.     Physical Exam: Vitals:   06/30/22 1625 06/30/22 2039 07/01/22 0600 07/01/22 0730  BP: (!) 152/64 (!) 185/78 (!) 166/72 (!) 176/68  Pulse: (!) 58 69 66 62  Resp: '16 16 18 16  '$ Temp: 98.4 F (36.9 C) 98.3 F (36.8 C) 98.2 F (36.8 C) 98.9 F (37.2 C)  TempSrc: Oral Oral Oral Oral  SpO2: 99% 100% 100% 99%  Weight:      Height:       Elderly adult female, lying in bed, no acute distress, interactive RRR, no murmurs, no peripheral edema Respiratory rate normal, lungs  clear without rales or wheezes Abdomen soft no tenderness palpation or guarding, no ascites or distention Left arm has no swelling, no induration, no joint effusions, he is diffusely severely tender to the light touch, hyperesthetic, but no skin changes, redness, or induration Attention normal, affect is anxious, judgment and insight appear at baseline, she is oriented to person, place, time, has generalized weakness, speech fluent, short-term memory seems questionable     Data Reviewed: Metabolic panel shows improved sodium, improved creatinine Urine culture pending TSH normal Magnesium normal Hemogram shows normalizing white count and slightly reduced hemoglobin       Disposition: Status is: Inpatient         Author: Edwin Dada, MD 07/01/2022 2:59 PM  For on call review [email or web address].

## 2022-07-01 NOTE — Discharge Summary (Signed)
Physician Discharge Summary   Patient: Cindy Huerta MRN: [id number] DOB: [birth year]  Admit date:     06/28/2022  Discharge date: 07/01/22  Discharge Physician: Edwin Dada   PCP: Juluis Pitch, MD     Recommendations at discharge:  Follow up with PCP Dr. Lovie Macadamia within 1 week after discharge from [facility]: Please obtain BMP in 1 week     Discharge Diagnoses: Principal Problem:   AKI (acute kidney injury) ([place]) Active Problems:   Hyponatremia   Acute urinary retention   Hypokalemia   HLD (hyperlipidemia)   Hypothyroidism   Depression with anxiety   CKD (chronic kidney disease), stage IIIa   History of TIA (transient ischemic attack)   HTN (hypertension)   Chronic diastolic CHF (congestive heart failure) (HCC)   Malnutrition of moderate degree      Hospital Course: [name] is a 76 y.o. F with HTN, anxiety, panic attacks,  CKD stage IIIb, chronic GI bleeding, and hyponatremia who presented after a fall.    In the ER, found to have an AKI and hyponatremia.   * AKI (acute kidney injury) ([place]) At baseline, her Cr is around 0.9-1.1 mg/dL, but was doubled to 2 mg/dL on admission.  With fluids, Cr has improved back to baseline 1.1 today  Hyponatremia Na improved on fluids, urine chemistries suggest dehydration. - Stop SSRI  Acute urinary retention Resolved.   HTN (hypertension) Chronic diastolic CHF (congestive heart failure) (HCC) BP controlled, appeared euvolemic to dehydrated.  Not on diuretics at baseline.    Telmisartan stopped at discharge.   - Check BMP in 1 week and resume telmisartan if needed             [facility] was reviewed for this patient prior to discharge.      Disposition: Skilled nursing facility   DISCHARGE MEDICATION: Allergies as of 07/01/2022       Reactions   Latex Rash   Lisinopril Cough   Procaine Other (See Comments)   Unsure - told by DDS not to let  anyone give it to her  Confusion   Diphenhydramine Hcl Other (See Comments)   States she was told to not take - jitteriness and agitation Not sure of reaction   Clarithromycin Other (See Comments)   Confusion   Oxycodone Nausea And Vomiting, Anxiety        Medication List     STOP taking these medications    FLUoxetine 20 MG capsule Commonly known as: PROZAC   metoCLOPramide 10 MG tablet Commonly known as: REGLAN   telmisartan 80 MG tablet Commonly known as: MICARDIS       TAKE these medications    acetaminophen 500 MG tablet Commonly known as: TYLENOL Take 500-1,000 mg by mouth every 6 (six) hours as needed for moderate pain.   alum & mag hydroxide-simeth 200-200-20 MG/5ML suspension Commonly known as: MAALOX/MYLANTA Take 30 mLs by mouth every 6 (six) hours as needed for indigestion or heartburn.   ARTIFICIAL TEAR SOLUTION OP Place 1 drop into both eyes daily as needed (dry eyes).   aspirin EC 81 MG tablet Take 1 tablet (81 mg total) by mouth daily. RESTART 48HRS AFTER DISCHARGE   atorvastatin 80 MG tablet Commonly known as: LIPITOR Take 80 mg by mouth daily.   cloNIDine 0.1 MG tablet Commonly known as: CATAPRES Take 0.1 mg by mouth 2 (two) times daily.   docusate sodium 100 MG capsule Commonly known as: COLACE Take 1  capsule (100 mg total) by mouth 2 (two) times daily.   feeding supplement Liqd Take 237 mLs by mouth 3 (three) times daily between meals.   fluticasone 50 MCG/ACT nasal spray Commonly known as: FLONASE Place 1 spray into both nostrils daily as needed for allergies or rhinitis.   hydrALAZINE 25 MG tablet Commonly known as: APRESOLINE Take 25 mg by mouth 3 (three) times daily.   levothyroxine 75 MCG tablet Commonly known as: SYNTHROID Take 75 mcg by mouth daily before breakfast.   LORazepam 0.5 MG tablet Commonly known as: ATIVAN Take 1 tablet (0.5 mg total) by mouth daily.   magnesium oxide 400 MG tablet Commonly known as:  MAG-OX Take 400 mg by mouth daily.   meclizine 12.5 MG tablet Commonly known as: ANTIVERT Take 12.5 mg by mouth 3 (three) times daily as needed for dizziness.   metoprolol succinate 50 MG 24 hr tablet Commonly known as: TOPROL-XL Take 50 mg by mouth 2 (two) times daily.   pantoprazole 40 MG tablet Commonly known as: PROTONIX Take 1 tablet (40 mg total) by mouth daily. What changed: when to take this   potassium chloride SA 20 MEQ tablet Commonly known as: KLOR-CON M Take 20 mEq by mouth daily.   Thera-M Tabs Take 1 tablet by mouth daily.   traMADol 50 MG tablet Commonly known as: Ultram Take 1 tablet (50 mg total) by mouth every 6 (six) hours as needed for up to 10 doses for moderate pain.   trolamine salicylate 10 % cream Commonly known as: ASPERCREME Apply 1 application topically as needed for muscle pain.   vitamin B-12 100 MCG tablet Commonly known as: CYANOCOBALAMIN Take 100 mg by mouth 2 (two) times daily.        Contact information for follow-up providers     Juluis Pitch, MD Follow up.   Specialty: Family Medicine Contact information: [address] [place] [phone]              Contact information for after-discharge care     Destination     [facility] .   Service: Skilled Nursing Contact information: [address] [place] [phone]                     Discharge Instructions     Increase activity slowly   Complete by: As directed        Discharge Exam: Filed Weights   06/28/22 1023  Weight: 71 kg    General: Pt is alert, awake, not in acute distress Cardiovascular: RRR, nl S1-S2, no murmurs appreciated.   No LE edema.   Respiratory: Normal respiratory rate and rhythm.  CTAB without rales or wheezes. Abdominal: Abdomen soft and non-tender.  No distension or HSM.   Neuro/Psych: Strength symmetric in upper and lower extremities but with  generalized weakness.  Judgment and insight appear mildly impaired by short term memory loss.   Condition at discharge: fair  The results of significant diagnostics from this hospitalization (including imaging, microbiology, ancillary and laboratory) are listed below for reference.   Imaging Studies: CT Head Wo Contrast  Result Date: 06/28/2022 CLINICAL DATA:  Head trauma, minor (Age >= 65y); Neck trauma (Age >= 65y). Fall. EXAM: CT HEAD WITHOUT CONTRAST CT CERVICAL SPINE WITHOUT CONTRAST TECHNIQUE: Multidetector CT imaging of the head and cervical spine was performed following the standard protocol without intravenous contrast. Multiplanar CT image reconstructions of the cervical spine were also  generated. RADIATION DOSE REDUCTION: This exam was performed according to the departmental dose-optimization program which includes automated exposure control, adjustment of the mA and/or kV according to patient size and/or use of iterative reconstruction technique. COMPARISON:  CT head 06/19/2022 FINDINGS: CT HEAD FINDINGS Brain: There is no evidence of an acute infarct, intracranial hemorrhage, mass, midline shift, or extra-axial fluid collection. The ventricles and sulci are within normal limits for age. Patchy hypodensities in the cerebral white matter bilaterally are unchanged and nonspecific but compatible with moderate chronic small vessel ischemic disease. 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 Vascular: Calcified atherosclerosis at the skull base. No hyperdense vessel. Skull: No fracture or suspicious osseous lesion. Sinuses/Orbits: Paranasal sinuses and mastoid air cells are clear. Unremarkable orbits. Other: None. CT CERVICAL SPINE FINDINGS Alignment: No evidence of traumatic subluxation. Skull base and vertebrae: No acute fracture. Mild nonspecific heterogeneous density of the cervical spine diffusely. No destructive bone lesion. Soft tissues and spinal canal: No  prevertebral fluid or swelling. No visible canal hematoma. Disc levels: Cervical spondylosis with multilevel disc bulging and ligamentum flavum calcification. A central disc protrusion at C3-4 contributes to severe spinal stenosis. Upper chest: Clear lung apices. Other: None. IMPRESSION: 1. No evidence of acute intracranial abnormality. 2. Moderate chronic small vessel ischemic disease. 3. No evidence of acute cervical spine fracture. 4. 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 Electronically Signed   By: Logan Bores M.D.   On: 06/28/2022 13:35   CT Cervical Spine Wo Contrast  Result Date: 06/28/2022 CLINICAL DATA:  Head trauma, minor (Age >= 65y); Neck trauma (Age >= 65y). Fall. EXAM: CT HEAD WITHOUT CONTRAST CT CERVICAL SPINE WITHOUT CONTRAST TECHNIQUE: Multidetector CT imaging of the head and cervical spine was performed following the standard protocol without intravenous contrast. Multiplanar CT image reconstructions of the cervical spine were also generated. RADIATION DOSE REDUCTION: This exam was performed according to the departmental dose-optimization program which includes automated exposure control, adjustment of the mA and/or kV according to patient size and/or use of iterative reconstruction technique. COMPARISON:  CT head 06/19/2022 FINDINGS: CT HEAD FINDINGS Brain: There is no evidence of an acute infarct, intracranial hemorrhage, mass, midline shift, or extra-axial fluid collection. The ventricles and sulci are within normal limits for age. Patchy hypodensities in the cerebral white matter bilaterally are unchanged and nonspecific but compatible with moderate chronic small vessel ischemic disease. 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 Vascular: Calcified atherosclerosis at the skull base. No hyperdense vessel. Skull: No fracture or suspicious osseous lesion. Sinuses/Orbits: Paranasal sinuses and mastoid air cells are clear. Unremarkable orbits.  Other: None. CT CERVICAL SPINE FINDINGS Alignment: No evidence of traumatic subluxation. Skull base and vertebrae: No acute fracture. Mild nonspecific heterogeneous density of the cervical spine diffusely. No destructive bone lesion. Soft tissues and spinal canal: No prevertebral fluid or swelling. No visible canal hematoma. Disc levels: Cervical spondylosis with multilevel disc bulging and ligamentum flavum calcification. A central disc protrusion at C3-4 contributes to severe spinal stenosis. Upper chest: Clear lung apices. Other: None. IMPRESSION: 1. No evidence of acute intracranial abnormality. 2. Moderate chronic small vessel ischemic disease. 3. No evidence of acute cervical spine fracture. 4. 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 Electronically Signed   By: Logan Bores M.D.   On: 06/28/2022 13:35   DG Shoulder Left  Result Date: 06/28/2022 CLINICAL DATA:  Provided history: Fall. EXAM: LEFT SHOULDER - 2+ VIEW COMPARISON:  Report from  radiographs of the left shoulder 04/29/2022 (images unavailable). FINDINGS: There is normal bony alignment. No evidence of acute osseous or articular abnormality. Chronic irregularity at the superior aspect of the bony glenoid, which may reflect a loose body or fragmented osteophyte. Mild-to-moderate degenerative changes of the acromioclavicular joint. IMPRESSION: No evidence of acute osseous or articular abnormality. Chronic irregularity at the superior aspect of the bony glenoid, which may reflect a loose body or fragmented osteophyte. Mild-to-moderate acromioclavicular joint degenerative changes. Electronically Signed   By: Kellie Simmering D.O.   On: 06/28/2022 12:17   DG Lumbar Spine Complete  Result Date: 06/28/2022 CLINICAL DATA:  Fall EXAM: LUMBAR SPINE - COMPLETE 4+ VIEW COMPARISON:  CT 06/18/2022 FINDINGS: Five lumbar type vertebral segments. Vertebral body heights and alignment are maintained. No fracture identified. Intervertebral disc spaces  are relatively preserved. Minimal degenerative endplate changes. Mild lower lumbar facet arthrosis. Abdominal aortic atherosclerotic calcification. IMPRESSION: No acute fracture or static listhesis of the lumbar spine. Electronically Signed   By: Davina Poke D.O.   On: 06/28/2022 12:10   DG Pelvis 1-2 Views  Result Date: 06/28/2022 CLINICAL DATA:  Status post fall, unable to answer questions EXAM: PELVIS - 1-2 VIEW COMPARISON:  None Available. FINDINGS: There is no evidence of pelvic fracture or diastasis. Tiny marginal osteophytes of bilateral femoral heads. Mild osteoarthritis of the right hip. No pelvic bone lesions are seen. IMPRESSION: 1. No acute osseous injury of bilateral hips. Electronically Signed   By: Kathreen Devoid M.D.   On: 06/28/2022 11:04   DG Chest 2 View  Result Date: 06/28/2022 CLINICAL DATA:  Trauma, fall, confusion EXAM: CHEST - 2 VIEW COMPARISON:  12/01/2021 FINDINGS: Cardiac size is within normal limits. Lung fields are clear of any infiltrate or pulmonary edema. There is no pleural effusion or pneumothorax. IMPRESSION: No active cardiopulmonary disease. Electronically Signed   By: Elmer Picker M.D.   On: 06/28/2022 11:02   CT HEAD WO CONTRAST (5MM)  Result Date: 06/19/2022 CLINICAL DATA:  76 year old female with history of altered mental status. Anxiousness and dizziness. Head CT 01/13/2022. EXAM: CT HEAD WITHOUT CONTRAST TECHNIQUE: Contiguous axial images were obtained from the base of the skull through the vertex without intravenous contrast. RADIATION DOSE REDUCTION: This exam was performed according to the departmental dose-optimization program which includes automated exposure control, adjustment of the mA and/or kV according to patient size and/or use of iterative reconstruction technique. COMPARISON:  None Available. FINDINGS: Brain: Mild cerebral atrophy. Patchy and confluent areas of decreased attenuation are noted throughout the deep and periventricular white  matter of the cerebral hemispheres bilaterally, compatible with chronic microvascular ischemic disease. 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 No evidence of acute infarction, hemorrhage, hydrocephalus, extra-axial collection or mass lesion/mass effect. Vascular: No hyperdense vessel or unexpected calcification. Skull: Normal. Negative for fracture or focal lesion. Sinuses/Orbits: No acute finding. Other: None. IMPRESSION: 1. No acute intracranial abnormalities. 2. Mild cerebral atrophy with chronic microvascular ischemic changes in the cerebral white matter and old right head of caudate lacunar infarct, similar to the prior study, as above. Electronically Signed   By: Vinnie Langton M.D.   On: 06/19/2022 12:06   CT Renal Stone Study  Result Date: 06/18/2022 CLINICAL DATA:  Acute onset bladder pain and dysuria this morning. EXAM: CT ABDOMEN AND PELVIS WITHOUT CONTRAST TECHNIQUE: Multidetector CT imaging of the abdomen and pelvis was performed following the standard protocol without IV  contrast. RADIATION DOSE REDUCTION: This exam was performed according to the departmental dose-optimization program which includes automated exposure control, adjustment of the mA and/or kV according to patient size and/or use of iterative reconstruction technique. COMPARISON:  10/06/2021 FINDINGS: Lower chest: No acute findings. Hepatobiliary: No mass visualized on this unenhanced exam. Prior cholecystectomy. No evidence of biliary obstruction. Pancreas: No mass or inflammatory process visualized on this unenhanced exam. Spleen:  Within normal limits in size. Adrenals/Urinary tract: No evidence of urolithiasis or hydronephrosis. Unopacified urinary bladder is nondilated and unremarkable in appearance. Stomach/Bowel: No evidence of obstruction, inflammatory process, or abnormal fluid collections. Diverticulosis is seen mainly involving  the descending and sigmoid colon, however there is no evidence of diverticulitis. Vascular/Lymphatic: No pathologically enlarged lymph nodes identified. No evidence of abdominal aortic aneurysm. Aortic atherosclerotic calcification incidentally noted. Reproductive: Prior hysterectomy noted. Adnexal regions are unremarkable in appearance. Other:  None. Musculoskeletal:  No suspicious bone lesions identified. IMPRESSION: No evidence of urolithiasis, hydronephrosis, or other acute findings. Colonic diverticulosis, without radiographic evidence of diverticulitis. Electronically Signed   By: Marlaine Hind M.D.   On: 06/18/2022 15:32    Microbiology: Results for orders placed or performed during the hospital encounter of 06/18/22  Urine Culture     Status: Abnormal   Collection Time: 06/18/22 12:09 PM   Specimen: Urine, Clean Catch  Result Value Ref Range Status   Specimen Description   Final    URINE, CLEAN CATCH Performed at [facility], [address], [place], [place] [address]    Special Requests   Final    NONE Performed at [facility], [address], [place], [place] [address]    Culture (A)  Final    <10,000 COLONIES/mL INSIGNIFICANT GROWTH Performed at [facility], [place] [address] [place], [place] [address]    Report Status 06/19/2022 FINAL  Final    Labs: CBC: Recent Labs  Lab 06/28/22 1135 06/29/22 0304 07/01/22 0341  WBC 18.3* 14.6* 11.0*  NEUTROABS 14.8*  --   --   HGB 15.1* 12.2 10.0*  HCT 42.5 34.3* 29.5*  MCV 85.0 85.1 89.9  PLT 326 231 109   Basic Metabolic Panel: Recent Labs  Lab 06/28/22 1135 06/29/22 0304 06/29/22 1549 06/30/22 0319 07/01/22 0341  NA 129* 127* 129* 128* 132*  K 2.7* 2.9* 2.9* 3.1* 4.2  CL 85* 89* 92* 96* 105  CO2 21* '26 24 24 24  '$ GLUCOSE 115* 110* 89 89 111*  BUN 20 24* 25* 23 15  CREATININE 2.00* 1.72* 1.65* 1.46* 1.08*  CALCIUM 10.2 8.9 9.2 8.3* 8.3*  MG  --   --   --  1.6* 1.8   Liver Function  Tests: No results for input(s): "AST", "ALT", "ALKPHOS", "BILITOT", "PROT", "ALBUMIN" in the last 168 hours. CBG: No results for input(s): "GLUCAP" in the last 168 hours.  Discharge time spent: approximately 35 minutes spent on discharge counseling, evaluation of patient on day of discharge, and coordination of discharge planning with nursing, social work, pharmacy and case management  Signed: Edwin Dada, MD [facility] 07/01/2022

## 2022-07-01 NOTE — Plan of Care (Signed)
Problem: Education: Goal: Knowledge of General Education information will improve Description: Including pain rating scale, medication(s)/side effects and non-pharmacologic comfort measures 07/01/2022 1658 by Rhoderick Moody, RN Outcome: Adequate for Discharge 07/01/2022 1606 by Rhoderick Moody, RN Outcome: Adequate for Discharge   Problem: Health Behavior/Discharge Planning: Goal: Ability to manage health-related needs will improve 07/01/2022 1658 by Rhoderick Moody, RN Outcome: Adequate for Discharge 07/01/2022 1606 by Rhoderick Moody, RN Outcome: Adequate for Discharge   Problem: Clinical Measurements: Goal: Ability to maintain clinical measurements within normal limits will improve 07/01/2022 1658 by Rhoderick Moody, RN Outcome: Adequate for Discharge 07/01/2022 1606 by Rhoderick Moody, RN Outcome: Adequate for Discharge Goal: Will remain free from infection 07/01/2022 1658 by Rhoderick Moody, RN Outcome: Adequate for Discharge 07/01/2022 1606 by Rhoderick Moody, RN Outcome: Adequate for Discharge Goal: Diagnostic test results will improve 07/01/2022 1658 by Rhoderick Moody, RN Outcome: Adequate for Discharge 07/01/2022 1606 by Rhoderick Moody, RN Outcome: Adequate for Discharge Goal: Respiratory complications will improve 07/01/2022 1658 by Rhoderick Moody, RN Outcome: Adequate for Discharge 07/01/2022 1606 by Rhoderick Moody, RN Outcome: Adequate for Discharge Goal: Cardiovascular complication will be avoided 07/01/2022 1658 by Rhoderick Moody, RN Outcome: Adequate for Discharge 07/01/2022 1606 by Rhoderick Moody, RN Outcome: Adequate for Discharge   Problem: Activity: Goal: Risk for activity intolerance will decrease 07/01/2022 1658 by Rhoderick Moody, RN Outcome: Adequate for Discharge 07/01/2022 1606 by Rhoderick Moody, RN Outcome: Adequate for Discharge   Problem: Nutrition: Goal: Adequate nutrition will be  maintained 07/01/2022 1658 by Rhoderick Moody, RN Outcome: Adequate for Discharge 07/01/2022 1606 by Rhoderick Moody, RN Outcome: Adequate for Discharge   Problem: Coping: Goal: Level of anxiety will decrease 07/01/2022 1658 by Rhoderick Moody, RN Outcome: Adequate for Discharge 07/01/2022 1606 by Rhoderick Moody, RN Outcome: Adequate for Discharge   Problem: Elimination: Goal: Will not experience complications related to bowel motility 07/01/2022 1658 by Rhoderick Moody, RN Outcome: Adequate for Discharge 07/01/2022 1606 by Rhoderick Moody, RN Outcome: Adequate for Discharge Goal: Will not experience complications related to urinary retention 07/01/2022 1658 by Rhoderick Moody, RN Outcome: Adequate for Discharge 07/01/2022 1606 by Rhoderick Moody, RN Outcome: Adequate for Discharge   Problem: Pain Managment: Goal: General experience of comfort will improve 07/01/2022 1658 by Rhoderick Moody, RN Outcome: Adequate for Discharge 07/01/2022 1606 by Rhoderick Moody, RN Outcome: Adequate for Discharge   Problem: Safety: Goal: Ability to remain free from injury will improve 07/01/2022 1658 by Rhoderick Moody, RN Outcome: Adequate for Discharge 07/01/2022 1606 by Rhoderick Moody, RN Outcome: Adequate for Discharge   Problem: Skin Integrity: Goal: Risk for impaired skin integrity will decrease 07/01/2022 1658 by Rhoderick Moody, RN Outcome: Adequate for Discharge 07/01/2022 1606 by Rhoderick Moody, RN Outcome: Adequate for Discharge   Problem: Education: Goal: Knowledge of General Education information will improve Description: Including pain rating scale, medication(s)/side effects and non-pharmacologic comfort measures 07/01/2022 1658 by Rhoderick Moody, RN Outcome: Adequate for Discharge 07/01/2022 1606 by Rhoderick Moody, RN Outcome: Adequate for Discharge   Problem: Clinical Measurements: Goal: Ability to maintain  clinical measurements within normal limits will improve 07/01/2022 1658 by Rhoderick Moody, RN Outcome: Adequate for Discharge 07/01/2022 1606 by Rhoderick Moody, RN Outcome: Adequate for Discharge Goal: Will remain free from infection 07/01/2022 1658 by Rhoderick Moody, RN Outcome: Adequate for Discharge 07/01/2022 1606 by  Nariya Neumeyer, Lodema Hong, RN Outcome: Adequate for Discharge Goal: Diagnostic test results will improve 07/01/2022 1658 by Rhoderick Moody, RN Outcome: Adequate for Discharge 07/01/2022 1606 by Rhoderick Moody, RN Outcome: Adequate for Discharge Goal: Respiratory complications will improve 07/01/2022 1658 by Rhoderick Moody, RN Outcome: Adequate for Discharge 07/01/2022 1606 by Rhoderick Moody, RN Outcome: Adequate for Discharge Goal: Cardiovascular complication will be avoided 07/01/2022 1658 by Rhoderick Moody, RN Outcome: Adequate for Discharge 07/01/2022 1606 by Rhoderick Moody, RN Outcome: Adequate for Discharge   Problem: Activity: Goal: Risk for activity intolerance will decrease 07/01/2022 1658 by Rhoderick Moody, RN Outcome: Adequate for Discharge 07/01/2022 1606 by Rhoderick Moody, RN Outcome: Adequate for Discharge   Problem: Nutrition: Goal: Adequate nutrition will be maintained 07/01/2022 1658 by Rhoderick Moody, RN Outcome: Adequate for Discharge 07/01/2022 1606 by Rhoderick Moody, RN Outcome: Adequate for Discharge   Problem: Coping: Goal: Level of anxiety will decrease 07/01/2022 1658 by Rhoderick Moody, RN Outcome: Adequate for Discharge 07/01/2022 1606 by Rhoderick Moody, RN Outcome: Adequate for Discharge   Problem: Elimination: Goal: Will not experience complications related to bowel motility 07/01/2022 1658 by Rhoderick Moody, RN Outcome: Adequate for Discharge 07/01/2022 1606 by Rhoderick Moody, RN Outcome: Adequate for Discharge Goal: Will not experience complications related  to urinary retention 07/01/2022 1658 by Rhoderick Moody, RN Outcome: Adequate for Discharge 07/01/2022 1606 by Rhoderick Moody, RN Outcome: Adequate for Discharge   Problem: Pain Managment: Goal: General experience of comfort will improve 07/01/2022 1658 by Rhoderick Moody, RN Outcome: Adequate for Discharge 07/01/2022 1606 by Rhoderick Moody, RN Outcome: Adequate for Discharge   Problem: Skin Integrity: Goal: Risk for impaired skin integrity will decrease 07/01/2022 1658 by Rhoderick Moody, RN Outcome: Adequate for Discharge 07/01/2022 1606 by Rhoderick Moody, RN Outcome: Adequate for Discharge

## 2022-07-01 NOTE — TOC Transition Note (Signed)
Transition of Care [facility]) - CM/SW Discharge Note   Patient Details  Name: Cindy Huerta MRN: [id number] Date of Birth: 06/10/1946  [facility]) CM/SW Contact:  Tresa Endo Phone Number: 07/01/2022, 3:03 PM   Clinical Narrative:    Patient will DC to: [facility] Anticipated DC date: 07/01/2022 Family notified: Pt son Transport by: Pt son   Per MD patient ready for DC to [facility]. RN to call report prior to discharge [phone]). RN, patient, patient's family, and facility notified of DC. Discharge Summary and FL2 sent to facility. DC packet on chart. Ambulance transport requested for patient.   CSW will sign off for now as social work intervention is no longer needed. Please consult [place] again if new needs arise.           Patient Goals and CMS Choice        Discharge Placement                       Discharge Plan and Services                                     Social Determinants of Health (SDOH) Interventions     Readmission Risk Interventions    06/20/2022    1:01 PM 11/25/2021    1:15 PM 10/10/2021    1:24 PM  Readmission Risk Prevention Plan  Transportation Screening  Complete Complete  PCP or Specialist Appt within 5-7 Days   Complete  PCP or Specialist Appt within 3-5 Days Complete Complete   Home Care Screening   Complete  Medication Review (RN CM)   Complete  HRI or Home Care Consult  Complete   Social Work Consult for [place] Planning/Counseling Complete Complete   Palliative Care Screening Not Applicable Not Applicable   Medication Review Press photographer)  Complete

## 2022-07-01 NOTE — Progress Notes (Signed)
Occupational Therapy Treatment Patient Details Name: Cindy Huerta MRN: [id number] DOB: 1946/02/16 Today's Date: 07/01/2022   History of present illness Pt is a 76 y.o. female with medical history significant of HTN; HLD; anxiety/depression; stage 3b CKD; GI bleeding; chronic GI bleeding; and hyponatremia presenting with a mechanical fall. She was last admitted at [facility] from 7/14-16 with symptomatic hyponatremia. She had significant anxiety/panic during the hospitalization and family is now expressing concern about dementia. He reports that she has significant anxiety and when she gets anxious she won't eat or drink and has dry heaves. He is concerned about underlying dementia and thinks that she needs to be placed.   OT comments  Pt with noted physical improvements since initial OT eval though cognitive deficits still hinder complete independence with daily tasks. Overall, pt requires Min A for toileting tasks and bathroom mobility using RW. Pt able to appropriately sequence/initiate some familiar ADL tasks though requires cues for others. Administered Short Blessed Test during session with score of 22 (normal cognition 0-4) indicating significant cognitive impairment and need for further evaluation of possible dementing disorder. Continue to rec SNF at DC   Recommendations for follow up therapy are one component of a multi-disciplinary discharge planning process, led by the attending physician.  Recommendations may be updated based on patient status, additional functional criteria and insurance authorization.    Follow Up Recommendations  Skilled nursing-short term rehab (<3 hours/day)    Assistance Recommended at Discharge Frequent or constant Supervision/Assistance  Patient can return home with the following  Assistance with feeding;Direct supervision/assist for medications management;Direct supervision/assist for financial management;A little help with walking and/or transfers;A little help  with bathing/dressing/bathroom   Equipment Recommendations  None recommended by OT    Recommendations for Other Services      Precautions / Restrictions Precautions Precautions: Fall Precaution Comments: monitor for dizziness/vertigo Restrictions Weight Bearing Restrictions: No       Mobility Bed Mobility Overal bed mobility: Needs Assistance Bed Mobility: Supine to Sit     Supine to sit: HOB elevated, Supervision          Transfers Overall transfer level: Needs assistance Equipment used: Rolling walker (2 wheels) Transfers: Sit to/from Stand Sit to Stand: Min assist           General transfer comment: Min A to stand from bedside with RW with assist to correct posterior bias. able to stand from toilet with light Min A     Balance Overall balance assessment: Needs assistance Sitting-balance support: No upper extremity supported Sitting balance-Leahy Scale: Fair     Standing balance support: Bilateral upper extremity supported, During functional activity Standing balance-Leahy Scale: Fair Standing balance comment: able to stand at sink for ADLs, RW for mobility                           ADL either performed or assessed with clinical judgement   ADL Overall ADL's : Needs assistance/impaired     Grooming: Min guard;Standing;Wash/dry hands Grooming Details (indicate cue type and reason): no cues for sequencing task with good initiation                 Toilet Transfer: Minimal assistance;Ambulation;Rolling walker (2 wheels);Regular Glass blower/designer Details (indicate cue type and reason): light assist for standing transfers to gain balance and cues for DME use Toileting- Clothing Manipulation and Hygiene: Minimal assistance;Sitting/lateral lean;Sit to/from stand Toileting - Clothing Manipulation Details (indicate cue type and reason): assist  for clothing mgmt, pt able to perform anterior hygiene partially standing at toilet      Functional mobility during ADLs: Minimal assistance;Rolling walker (2 wheels);Cueing for safety;Cueing for sequencing General ADL Comments: Improving functional abilities with ADLs though cognitive impairments hinder independent completion of tasks.    Extremity/Trunk Assessment Upper Extremity Assessment Upper Extremity Assessment: Generalized weakness;LUE deficits/detail LUE Deficits / Details: able to hold to RW, use L UE functionally during tasks   Lower Extremity Assessment Lower Extremity Assessment: Defer to PT evaluation        Vision   Vision Assessment?: No apparent visual deficits   Perception     Praxis      Cognition Arousal/Alertness: Awake/alert Behavior During Therapy: Flat affect Overall Cognitive Status: No family/caregiver present to determine baseline cognitive functioning                                 General Comments: Pt with confusion and lack of sustained attention. intermittently able to sequence basic ADLs w/o cues (hand hygiene) but requires cues for other tasks (toileting hygiene). pt did demo engagement in humor, line mgmt initially but with administration of Short Blessed Test, pt with decreased responsiveness when questions became progressively more difficult and deflected ("how am i supposed to count backwards when I am hungry?"). SBT score 22 (normal cog is 0-4). Score of 10+ significantly impaired cognition with indication to evaluate further for dementing disorder        Exercises      Shoulder Instructions       General Comments BP at 158/73 and HR at 65 prior to mobility    Pertinent Vitals/ Pain       Pain Assessment Pain Assessment: Faces Faces Pain Scale: No hurt Pain Intervention(s): Monitored during session  Home Living                                          Prior Functioning/Environment              Frequency  Min 2X/week        Progress Toward Goals  OT Goals(current goals  can now be found in the care plan section)  Progress towards OT goals: Progressing toward goals  Acute Rehab OT Goals Patient Stated Goal: eat some lunch OT Goal Formulation: With patient Time For Goal Achievement: 07/13/22 Potential to Achieve Goals: Good ADL Goals Pt Will Perform Grooming: with supervision;sitting Pt Will Perform Lower Body Dressing: with min assist;sit to/from stand Pt Will Transfer to Toilet: with min assist;ambulating Additional ADL Goal #1: Pt to complete Short Blessed test for further functional cognitive assessment  Plan Discharge plan remains appropriate    Co-evaluation                 AM-PAC OT "6 Clicks" Daily Activity     Outcome Measure   Help from another person eating meals?: A Little Help from another person taking care of personal grooming?: A Little Help from another person toileting, which includes using toliet, bedpan, or urinal?: A Little Help from another person bathing (including washing, rinsing, drying)?: A Lot Help from another person to put on and taking off regular upper body clothing?: A Little Help from another person to put on and taking off regular lower body clothing?: A Lot 6 Click Score: 16  End of Session Equipment Utilized During Treatment: Gait belt;Rolling walker (2 wheels)  OT Visit Diagnosis: Unsteadiness on feet (R26.81);Muscle weakness (generalized) (M62.81);History of falling (Z91.81);Other symptoms and signs involving cognitive function;Dizziness and giddiness (R42)   Activity Tolerance Patient tolerated treatment well   Patient Left in chair;with call bell/phone within reach;with chair alarm set   Nurse Communication Mobility status        Time: 1561-5379 OT Time Calculation (min): 24 min  Charges: OT General Charges $OT Visit: 1 Visit OT Treatments $Self Care/Home Management : 8-22 mins $Therapeutic Activity: 8-22 mins  Malachy Chamber, OTR/L Acute Rehab Services Office: [phone]   Layla Maw 07/01/2022, 1:37 PM

## 2022-07-01 NOTE — NC FL2 (Signed)
[place] MEDICAID FL2 LEVEL OF CARE SCREENING TOOL     IDENTIFICATION  Patient Name: Cindy Huerta Birthdate: 1946-01-20 Sex: female Admission Date (Current Location): 06/28/2022  [facility] and [place] Number:  Herbalist and Address:  [facility] [place] [place] [address], [place], [place] [address]      Provider Number: [id number]  Attending Physician Name and Address:  Edwin Dada, *  Relative Name and Phone Number:  Diani, Jillson [phone]    Current Level of Care: Hospital Recommended Level of Care: [place] Prior Approval Number:    Date Approved/Denied:   PASRR Number: [id number]  Discharge Plan: SNF    Current Diagnoses: Patient Active Problem List   Diagnosis Date Noted   Malnutrition of moderate degree 06/30/2022   Acute urinary retention 06/29/2022   AKI (acute kidney injury) ([place]) 06/28/2022   DNR (do not resuscitate) 06/28/2022   Hyponatremia 06/18/2022   Hypokalemia 06/18/2022   Chronic diastolic CHF (congestive heart failure) ([place]) 06/18/2022   Dysuria 06/18/2022   HTN (hypertension) 11/24/2021   Normocytic anemia 11/24/2021   Vertigo 11/24/2021   Breast CA ([place]) 11/22/2021   Breast cancer ([place]) 11/20/2021   Intractable nausea and vomiting 10/09/2021   Weakness 10/07/2021   Carcinoma of overlapping sites of left breast in female, estrogen receptor positive ([place]) 10/06/2021   Dizziness 10/06/2021   Rectal bleeding 10/06/2021   Nausea & vomiting 10/06/2021   GIB (gastrointestinal bleeding) 10/06/2021   Left-sided weakness 07/29/2021   HLD (hyperlipidemia) 07/29/2021   Hypothyroidism 07/29/2021   Left sided numbness 07/29/2021   Depression with anxiety    CKD (chronic kidney disease), stage IIIa    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   Epigastric pain 04/23/2021   GERD (gastroesophageal reflux disease) 04/23/2021   Benign essential hypertension 01/20/2021   History of breast cancer 01/20/2021    History of TIA (transient ischemic attack) 01/20/2021   Palpitations 01/20/2021   Malignant neoplasm of upper-outer quadrant of left female breast ([place]) 12/06/2018    Orientation RESPIRATION BLADDER Height & Weight     Self, Time, Situation, Place  Normal Continent Weight: 156 lb 8.4 oz (71 kg) Height:  '5\' 6"'$  (167.6 cm)  BEHAVIORAL SYMPTOMS/MOOD NEUROLOGICAL BOWEL NUTRITION STATUS      Continent Diet (See Dc summary)  AMBULATORY STATUS COMMUNICATION OF NEEDS Skin   Extensive Assist Verbally Normal                       Personal Care Assistance Level of Assistance  Bathing, Feeding, Dressing Bathing Assistance: Maximum assistance Feeding assistance: Limited assistance Dressing Assistance: Maximum assistance     Functional Limitations Info  Sight, Hearing, Speech Sight Info: Impaired Hearing Info: Adequate Speech Info: Adequate    SPECIAL CARE FACTORS FREQUENCY  PT (By licensed PT), OT (By licensed OT)     PT Frequency: 5x week OT Frequency: 5x week            Contractures Contractures Info: Not present    Additional Factors Info  Code Status, Allergies Code Status Info: DNR Allergies Info: Latex   Lisinopril   Procaine   Diphenhydramine Hcl   Clarithromycin   Oxycodone           Current Medications (07/01/2022):  This is the current hospital active medication list Current Facility-Administered Medications  Medication Dose Route Frequency Provider Last Rate Last Admin   0.9 %  sodium chloride infusion   Intravenous Continuous Mariel Aloe,  MD 75 mL/hr at 07/01/22 0246 New Bag at 07/01/22 0246   acetaminophen (TYLENOL) tablet 650 mg  650 mg Oral Q6H PRN Karmen Bongo, MD   650 mg at 06/29/22 1550   Or   acetaminophen (TYLENOL) suppository 650 mg  650 mg Rectal Q6H PRN Karmen Bongo, MD       aspirin EC tablet 81 mg  81 mg Oral Daily Karmen Bongo, MD   81 mg at 07/01/22 5361   atorvastatin (LIPITOR) tablet 80 mg  80 mg Oral Daily Karmen Bongo,  MD   80 mg at 06/30/22 1743   bisacodyl (DULCOLAX) EC tablet 5 mg  5 mg Oral Daily PRN Karmen Bongo, MD       cloNIDine (CATAPRES) tablet 0.1 mg  0.1 mg Oral BID Karmen Bongo, MD   0.1 mg at 07/01/22 4431   docusate sodium (COLACE) capsule 100 mg  100 mg Oral BID Karmen Bongo, MD   100 mg at 07/01/22 0833   enoxaparin (LOVENOX) injection 40 mg  40 mg Subcutaneous Q24H Pham, Minh Q, RPH-CPP       feeding supplement (ENSURE ENLIVE / ENSURE PLUS) liquid 237 mL  237 mL Oral TID BM Mariel Aloe, MD   237 mL at 07/01/22 1358   haloperidol lactate (HALDOL) injection 2 mg  2 mg Intravenous Q6H PRN Karmen Bongo, MD       hydrALAZINE (APRESOLINE) injection 5 mg  5 mg Intravenous Q4H PRN Karmen Bongo, MD       hydrALAZINE (APRESOLINE) tablet 25 mg  25 mg Oral TID Karmen Bongo, MD   25 mg at 07/01/22 1358   hydrOXYzine (VISTARIL) injection 25 mg  25 mg Intramuscular Q6H PRN Karmen Bongo, MD   25 mg at 06/28/22 1851   levothyroxine (SYNTHROID) tablet 75 mcg  75 mcg Oral QAC breakfast Karmen Bongo, MD   75 mcg at 07/01/22 0538   LORazepam (ATIVAN) tablet 0.5 mg  0.5 mg Oral Q4H PRN Karmen Bongo, MD   0.5 mg at 06/29/22 2121   metoprolol succinate (TOPROL-XL) 24 hr tablet 50 mg  50 mg Oral BID Karmen Bongo, MD   50 mg at 07/01/22 5400   multivitamin with minerals tablet 1 tablet  1 tablet Oral Daily Mariel Aloe, MD   1 tablet at 07/01/22 0833   ondansetron (ZOFRAN) tablet 4 mg  4 mg Oral Q6H PRN Karmen Bongo, MD       Or   ondansetron [facility]) injection 4 mg  4 mg Intravenous Q6H PRN Karmen Bongo, MD   4 mg at 06/29/22 1856   pantoprazole (PROTONIX) EC tablet 40 mg  40 mg Oral BID Karmen Bongo, MD   40 mg at 07/01/22 8676   polyethylene glycol (MIRALAX / GLYCOLAX) packet 17 g  17 g Oral Daily PRN Karmen Bongo, MD       sodium chloride flush (NS) 0.9 % injection 3 mL  3 mL Intravenous Q12H Karmen Bongo, MD   3 mL at 06/30/22 1047   traMADol (ULTRAM) tablet 50  mg  50 mg Oral Q6H PRN Karmen Bongo, MD   50 mg at 07/01/22 0241     Discharge Medications: Please see discharge summary for a list of discharge medications.  Relevant Imaging Results:  Relevant Lab Results:   Additional Information SS# [id number]  Coralee Pesa, [place]

## 2022-07-01 NOTE — Plan of Care (Signed)
  Problem: Education: Goal: Knowledge of General Education information will improve Description: Including pain rating scale, medication(s)/side effects and non-pharmacologic comfort measures Outcome: Adequate for Discharge   Problem: Health Behavior/Discharge Planning: Goal: Ability to manage health-related needs will improve Outcome: Adequate for Discharge   Problem: Clinical Measurements: Goal: Ability to maintain clinical measurements within normal limits will improve Outcome: Adequate for Discharge Goal: Will remain free from infection Outcome: Adequate for Discharge Goal: Diagnostic test results will improve Outcome: Adequate for Discharge Goal: Respiratory complications will improve Outcome: Adequate for Discharge Goal: Cardiovascular complication will be avoided Outcome: Adequate for Discharge   Problem: Activity: Goal: Risk for activity intolerance will decrease Outcome: Adequate for Discharge   Problem: Nutrition: Goal: Adequate nutrition will be maintained Outcome: Adequate for Discharge   Problem: Coping: Goal: Level of anxiety will decrease Outcome: Adequate for Discharge   Problem: Elimination: Goal: Will not experience complications related to bowel motility Outcome: Adequate for Discharge Goal: Will not experience complications related to urinary retention Outcome: Adequate for Discharge   Problem: Pain Managment: Goal: General experience of comfort will improve Outcome: Adequate for Discharge   Problem: Safety: Goal: Ability to remain free from injury will improve Outcome: Adequate for Discharge   Problem: Skin Integrity: Goal: Risk for impaired skin integrity will decrease Outcome: Adequate for Discharge   Problem: Education: Goal: Knowledge of General Education information will improve Description: Including pain rating scale, medication(s)/side effects and non-pharmacologic comfort measures Outcome: Adequate for Discharge   Problem: Clinical  Measurements: Goal: Ability to maintain clinical measurements within normal limits will improve Outcome: Adequate for Discharge Goal: Will remain free from infection Outcome: Adequate for Discharge Goal: Diagnostic test results will improve Outcome: Adequate for Discharge Goal: Respiratory complications will improve Outcome: Adequate for Discharge Goal: Cardiovascular complication will be avoided Outcome: Adequate for Discharge   Problem: Activity: Goal: Risk for activity intolerance will decrease Outcome: Adequate for Discharge   Problem: Nutrition: Goal: Adequate nutrition will be maintained Outcome: Adequate for Discharge   Problem: Coping: Goal: Level of anxiety will decrease Outcome: Adequate for Discharge   Problem: Elimination: Goal: Will not experience complications related to bowel motility Outcome: Adequate for Discharge Goal: Will not experience complications related to urinary retention Outcome: Adequate for Discharge   Problem: Pain Managment: Goal: General experience of comfort will improve Outcome: Adequate for Discharge   Problem: Skin Integrity: Goal: Risk for impaired skin integrity will decrease Outcome: Adequate for Discharge

## 2022-07-01 NOTE — Evaluation (Signed)
Speech Language Pathology Evaluation Patient Details Name: Cindy Huerta MRN: [id number] DOB: Sep 04, 1946 Today's Date: 07/01/2022 Time: 9518-8416 SLP Time Calculation (min) (ACUTE ONLY): 17 min  Problem List:  Patient Active Problem List   Diagnosis Date Noted   Malnutrition of moderate degree 06/30/2022   Acute urinary retention 06/29/2022   AKI (acute kidney injury) ([place]) 06/28/2022   DNR (do not resuscitate) 06/28/2022   Hyponatremia 06/18/2022   Hypokalemia 06/18/2022   Chronic diastolic CHF (congestive heart failure) ([place]) 06/18/2022   Dysuria 06/18/2022   HTN (hypertension) 11/24/2021   Normocytic anemia 11/24/2021   Vertigo 11/24/2021   Breast CA ([place]) 11/22/2021   Breast cancer ([place]) 11/20/2021   Intractable nausea and vomiting 10/09/2021   Weakness 10/07/2021   Carcinoma of overlapping sites of left breast in female, estrogen receptor positive ([place]) 10/06/2021   Dizziness 10/06/2021   Rectal bleeding 10/06/2021   Nausea & vomiting 10/06/2021   GIB (gastrointestinal bleeding) 10/06/2021   Left-sided weakness 07/29/2021   HLD (hyperlipidemia) 07/29/2021   Hypothyroidism 07/29/2021   Left sided numbness 07/29/2021   Depression with anxiety    CKD (chronic kidney disease), stage IIIa    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   Epigastric pain 04/23/2021   GERD (gastroesophageal reflux disease) 04/23/2021   Benign essential hypertension 01/20/2021   History of breast cancer 01/20/2021   History of TIA (transient ischemic attack) 01/20/2021   Palpitations 01/20/2021   Malignant neoplasm of upper-outer quadrant of left female breast ([place]) 12/06/2018   Past Medical History:  Past Medical History:  Diagnosis Date   Arthritis    Breast cancer ([place])    Depression    GERD (gastroesophageal reflux disease)    HLD (hyperlipidemia)    Hypertension    Hypothyroidism    Past Surgical History:  Past Surgical History:  Procedure Laterality Date   ABDOMINAL HYSTERECTOMY  1982    AXILLARY SENTINEL NODE BIOPSY Left 11/20/2021   Procedure: AXILLARY SENTINEL NODE BIOPSY;  Surgeon: Benjamine Sprague, DO;  Location: [facility] ORS;  Service: General;  Laterality: Left;   BREAST BIOPSY Left 09/23/2021   3:00 13cmfn venus marker, path pending   BREAST BIOPSY Left 09/23/2021   3:00 14 cmfn heart marker, path pending   CHOLECYSTECTOMY     ESOPHAGOGASTRODUODENOSCOPY (EGD) WITH PROPOFOL N/A 10/08/2021   Procedure: ESOPHAGOGASTRODUODENOSCOPY (EGD) WITH PROPOFOL;  Surgeon: Jonathon Bellows, MD;  Location: [facility] ENDOSCOPY;  Service: Gastroenterology;  Laterality: N/A;   HEMATOMA EVACUATION Left 11/21/2021   Procedure: EVACUATION HEMATOMA;  Surgeon: Benjamine Sprague, DO;  Location: [facility] ORS;  Service: General;  Laterality: Left;   left lumpectomy  2019   TOTAL MASTECTOMY Left 11/20/2021   Procedure: TOTAL MASTECTOMY;  Surgeon: Benjamine Sprague, DO;  Location: [facility] ORS;  Service: General;  Laterality: Left;   HPI:  Pt is a 76 y.o. female presenting with a mechanical fall. She was last admitted at [facility] from 7/14-16 with symptomatic hyponatremia. Family expressing concerns about possible underlying dementia. PMH: HTN; HLD; anxiety/depression; stage 3b CKD; GI bleeding; chronic GI bleeding; and hyponatremia   Assessment / Plan / Recommendation Clinical Impression  Pt was seen for cognitive-linguistic evaluation, although not participating in testing Berkel enough to complete SLUMS. During more informal assessment, pt has intermittent word-finding errors as well as fluctuating speech. At times she speaks clearly and fluently, and at other times she is very soft spoken and demonstrating stuttering-like dysfluencies. She was oriented but had difficulty wtih immediate and delayed recall, and then appeared to be upset during other more  challenging tasks. Question effort during simple calculations, with pt just saying "I don't know" despite encouragement to try to work through functional problems. Pt then became tearful  and said that she couldn't finish testing at the moment because "my mom told me not to talk with my mouth full." Will continue to follow for ongoing cognitive-linguistic assessment.    SLP Assessment  SLP Recommendation/Assessment: Patient needs continued Speech [place] [facility] Services SLP Visit Diagnosis: Cognitive communication deficit (R41.841)    Recommendations for follow up therapy are one component of a multi-disciplinary discharge planning process, led by the attending physician.  Recommendations may be updated based on patient status, additional functional criteria and insurance authorization.    Follow Up Recommendations  Skilled nursing-short term rehab (<3 hours/day)    Assistance Recommended at Discharge  Frequent or constant Supervision/Assistance  Functional Status Assessment Patient has had a recent decline in their functional status and demonstrates the ability to make significant improvements in function in a reasonable and predictable amount of time.  Frequency and Duration min 2x/week  2 weeks      SLP Evaluation Cognition  Overall Cognitive Status: No family/caregiver present to determine baseline cognitive functioning Arousal/Alertness: Awake/alert Orientation Level: Oriented X4 Attention: Sustained Sustained Attention: Impaired Sustained Attention Impairment: Verbal basic Memory: Impaired Memory Impairment: Retrieval deficit;Storage deficit Problem Solving: Impaired Problem Solving Impairment: Verbal basic       Comprehension  Auditory Comprehension Overall Auditory Comprehension: Impaired Conversation: Simple    Expression Expression Primary Mode of Expression: Verbal Verbal Expression Overall Verbal Expression: Impaired Initiation: No impairment Level of Generative/Spontaneous Verbalization: Conversation Naming: Impairment Verbal Errors: Other (comment) (anomia) Non-Verbal Means of Communication: Not applicable   Oral / Motor  Motor  Speech Overall Motor Speech: Impaired (see clinical impressions) Respiration: Within functional limits Phonation: Low vocal intensity Resonance: Within functional limits Articulation: Within functional limitis Intelligibility: Intelligible            Osie Bond., M.A. [place] Office [phone]  Secure chat preferred  07/01/2022, 2:54 PM

## 2022-07-01 NOTE — Plan of Care (Signed)

## 2022-07-26 ENCOUNTER — Observation Stay (HOSPITAL_COMMUNITY)
Admission: EM | Admit: 2022-07-26 | Discharge: 2022-07-30 | Disposition: A | Discharge status: Home Health | Payer: Medicare PPO | Attending: Internal Medicine | Admitting: Internal Medicine

## 2022-07-26 ENCOUNTER — Emergency Department (HOSPITAL_COMMUNITY): Payer: Medicare PPO

## 2022-07-26 ENCOUNTER — Encounter (HOSPITAL_COMMUNITY): Payer: Self-pay

## 2022-07-26 ENCOUNTER — Other Ambulatory Visit: Payer: Self-pay

## 2022-07-26 DIAGNOSIS — N1832 Chronic kidney disease, stage 3b: Secondary | ICD-10-CM | POA: Insufficient documentation

## 2022-07-26 DIAGNOSIS — Z79899 Other long term (current) drug therapy: Secondary | ICD-10-CM | POA: Insufficient documentation

## 2022-07-26 DIAGNOSIS — E876 Hypokalemia: Secondary | ICD-10-CM | POA: Diagnosis not present

## 2022-07-26 DIAGNOSIS — E871 Hypo-osmolality and hyponatremia: Secondary | ICD-10-CM | POA: Diagnosis not present

## 2022-07-26 DIAGNOSIS — N183 Chronic kidney disease, stage 3 unspecified: Secondary | ICD-10-CM | POA: Diagnosis present

## 2022-07-26 DIAGNOSIS — J189 Pneumonia, unspecified organism: Secondary | ICD-10-CM | POA: Diagnosis not present

## 2022-07-26 DIAGNOSIS — J180 Bronchopneumonia, unspecified organism: Secondary | ICD-10-CM

## 2022-07-26 DIAGNOSIS — R519 Headache, unspecified: Secondary | ICD-10-CM | POA: Diagnosis not present

## 2022-07-26 DIAGNOSIS — E039 Hypothyroidism, unspecified: Secondary | ICD-10-CM | POA: Diagnosis not present

## 2022-07-26 DIAGNOSIS — Z20822 Contact with and (suspected) exposure to covid-19: Secondary | ICD-10-CM | POA: Diagnosis not present

## 2022-07-26 DIAGNOSIS — R2681 Unsteadiness on feet: Secondary | ICD-10-CM | POA: Diagnosis not present

## 2022-07-26 DIAGNOSIS — Z7982 Long term (current) use of aspirin: Secondary | ICD-10-CM | POA: Diagnosis not present

## 2022-07-26 DIAGNOSIS — Z8673 Personal history of transient ischemic attack (TIA), and cerebral infarction without residual deficits: Secondary | ICD-10-CM | POA: Diagnosis not present

## 2022-07-26 DIAGNOSIS — Z853 Personal history of malignant neoplasm of breast: Secondary | ICD-10-CM | POA: Diagnosis not present

## 2022-07-26 DIAGNOSIS — K5900 Constipation, unspecified: Secondary | ICD-10-CM | POA: Insufficient documentation

## 2022-07-26 DIAGNOSIS — I160 Hypertensive urgency: Secondary | ICD-10-CM | POA: Diagnosis not present

## 2022-07-26 DIAGNOSIS — F419 Anxiety disorder, unspecified: Secondary | ICD-10-CM

## 2022-07-26 DIAGNOSIS — F418 Other specified anxiety disorders: Secondary | ICD-10-CM | POA: Diagnosis present

## 2022-07-26 DIAGNOSIS — R41841 Cognitive communication deficit: Secondary | ICD-10-CM | POA: Insufficient documentation

## 2022-07-26 DIAGNOSIS — I130 Hypertensive heart and chronic kidney disease with heart failure and stage 1 through stage 4 chronic kidney disease, or unspecified chronic kidney disease: Secondary | ICD-10-CM | POA: Diagnosis not present

## 2022-07-26 DIAGNOSIS — E860 Dehydration: Secondary | ICD-10-CM | POA: Diagnosis not present

## 2022-07-26 DIAGNOSIS — I5032 Chronic diastolic (congestive) heart failure: Secondary | ICD-10-CM | POA: Diagnosis not present

## 2022-07-26 DIAGNOSIS — E785 Hyperlipidemia, unspecified: Secondary | ICD-10-CM | POA: Diagnosis present

## 2022-07-26 DIAGNOSIS — Z66 Do not resuscitate: Secondary | ICD-10-CM | POA: Diagnosis present

## 2022-07-26 DIAGNOSIS — R112 Nausea with vomiting, unspecified: Secondary | ICD-10-CM | POA: Diagnosis present

## 2022-07-26 DIAGNOSIS — Z91040 Latex allergy status: Secondary | ICD-10-CM | POA: Diagnosis not present

## 2022-07-26 DIAGNOSIS — I10 Essential (primary) hypertension: Secondary | ICD-10-CM | POA: Diagnosis present

## 2022-07-26 MED ORDER — ONDANSETRON 4 MG PO TBDP
4.0000 mg | ORAL_TABLET | Freq: Once | ORAL | Status: AC
  Administered 2022-07-26: 4 mg via ORAL
  Filled 2022-07-26: qty 1

## 2022-07-26 NOTE — ED Triage Notes (Signed)
Son reports patient has panic attacks and starts dry heaving all the time and it starts all the other symptoms but patient reports this time headache started first and then she started vomiting. Patient constantly dry heaving and vommiting in triage and hard to assess.

## 2022-07-26 NOTE — ED Provider Triage Note (Addendum)
Emergency Medicine Provider Triage Evaluation Note  Cindy Huerta , a 76 y.o. female  was evaluated in triage.  Pt complains of headache, she reports similar to previous headaches that she has had but started somewhat more suddenly this afternoon, and began having nausea, dry heaving,, as well as feeling of dizziness.  Her son is present in the room and reports that she is getting a physical therapy evaluation when she began having all the symptoms, and the symptoms are consistent with prior panic attacks the patient has had.  She reports that she is having some tingling of left hand which is something that she has had before, otherwise denies any numbness or tingling.  She does arrive quite hypertensive and anxious.  Review of Systems  Positive: Headache, nausea, dizziness, vomiting Negative: Chest pain, shortness of breath  Physical Exam  BP (!) 207/123   Pulse (!) 45   Resp (!) 22   Ht '5\' 6"'$  (1.676 m)   Wt 70.8 kg   SpO2 100%   BMI 25.18 kg/m  Gen:   Awake, anxious, dry heaving, and then very hesitant speech Resp:  Normal effort  MSK:   Moves extremities without difficulty  Other:  Moves all 4 extremities spontaneously, CN II through XII grossly intact  Medical Decision Making  Medically screening exam initiated at 5:13 PM.  Appropriate orders placed.  Cindy Huerta was informed that the remainder of the evaluation will be completed by another provider, this initial triage assessment does not replace that evaluation, and the importance of remaining in the ED until their evaluation is complete.  Work-up initiated Does not appear clear for code stroke at this time on my evaluation without focal neurodeficits, but we will obtain urgent CT and patient made level 2   Anselmo Pickler, PA-C 07/26/22 1716

## 2022-07-27 ENCOUNTER — Encounter (HOSPITAL_COMMUNITY): Payer: Self-pay | Admitting: Internal Medicine

## 2022-07-27 ENCOUNTER — Emergency Department (HOSPITAL_COMMUNITY): Payer: Medicare PPO

## 2022-07-27 DIAGNOSIS — F419 Anxiety disorder, unspecified: Secondary | ICD-10-CM

## 2022-07-27 DIAGNOSIS — E860 Dehydration: Secondary | ICD-10-CM | POA: Diagnosis not present

## 2022-07-27 DIAGNOSIS — G4751 Confusional arousals: Secondary | ICD-10-CM | POA: Diagnosis not present

## 2022-07-27 LAB — BASIC METABOLIC PANEL
Anion gap: 15 (ref 5–15)
BUN: 11 mg/dL (ref 8–23)
CO2: 22 mmol/L (ref 22–32)
Calcium: 9.3 mg/dL (ref 8.9–10.3)
Chloride: 92 mmol/L — ABNORMAL LOW (ref 98–111)
Creatinine, Ser: 0.96 mg/dL (ref 0.44–1.00)
GFR, Estimated: >60 mL/min (ref >60)
Glucose, Bld: 120 mg/dL — ABNORMAL HIGH (ref 70–99)
Potassium: 3.4 mmol/L — ABNORMAL LOW (ref 3.5–5.1)
Sodium: 129 mmol/L — ABNORMAL LOW (ref 135–145)

## 2022-07-27 LAB — URINALYSIS, ROUTINE W REFLEX MICROSCOPIC
Bacteria, UA: NONE SEEN
Bilirubin Urine: NEGATIVE
Glucose, UA: NEGATIVE mg/dL
Ketones, ur: NEGATIVE mg/dL
Leukocytes,Ua: NEGATIVE
Nitrite: NEGATIVE
Protein, ur: 30 mg/dL — AB
Specific Gravity, Urine: 1.015 (ref 1.005–1.030)
pH: 7 (ref 5.0–8.0)

## 2022-07-27 LAB — TROPONIN I (HIGH SENSITIVITY)
Troponin I (High Sensitivity): 20 ng/L — ABNORMAL HIGH (ref <18)
Troponin I (High Sensitivity): 29 ng/L — ABNORMAL HIGH (ref <18)

## 2022-07-27 LAB — COMPREHENSIVE METABOLIC PANEL
ALT: 24 U/L (ref 0–44)
AST: 45 U/L — ABNORMAL HIGH (ref 15–41)
Albumin: 4.6 g/dL (ref 3.5–5.0)
Alkaline Phosphatase: 60 U/L (ref 38–126)
Anion gap: 20 — ABNORMAL HIGH (ref 5–15)
BUN: 13 mg/dL (ref 8–23)
CO2: 17 mmol/L — ABNORMAL LOW (ref 22–32)
Calcium: 10.3 mg/dL (ref 8.9–10.3)
Chloride: 90 mmol/L — ABNORMAL LOW (ref 98–111)
Creatinine, Ser: 1.3 mg/dL — ABNORMAL HIGH (ref 0.44–1.00)
GFR, Estimated: 42 mL/min — ABNORMAL LOW (ref >60)
Glucose, Bld: 175 mg/dL — ABNORMAL HIGH (ref 70–99)
Potassium: 2.9 mmol/L — ABNORMAL LOW (ref 3.5–5.1)
Sodium: 127 mmol/L — ABNORMAL LOW (ref 135–145)
Total Bilirubin: 0.9 mg/dL (ref 0.3–1.2)
Total Protein: 9.1 g/dL — ABNORMAL HIGH (ref 6.5–8.1)

## 2022-07-27 LAB — I-STAT VENOUS BLOOD GAS, ED
Acid-Base Excess: 2 mmol/L (ref 0.0–2.0)
Bicarbonate: 24.7 mmol/L (ref 20.0–28.0)
Calcium, Ion: 1.03 mmol/L — ABNORMAL LOW (ref 1.15–1.40)
HCT: 39 % (ref 36.0–46.0)
Hemoglobin: 13.3 g/dL (ref 12.0–15.0)
O2 Saturation: 83 %
Potassium: 3.5 mmol/L (ref 3.5–5.1)
Sodium: 129 mmol/L — ABNORMAL LOW (ref 135–145)
TCO2: 26 mmol/L (ref 22–32)
pCO2, Ven: 31.6 mmHg — ABNORMAL LOW (ref 44–60)
pH, Ven: 7.501 — ABNORMAL HIGH (ref 7.25–7.43)
pO2, Ven: 43 mmHg (ref 32–45)

## 2022-07-27 LAB — CBC
HCT: 39.2 % (ref 36.0–46.0)
Hemoglobin: 13.5 g/dL (ref 12.0–15.0)
MCH: 30.2 pg (ref 26.0–34.0)
MCHC: 34.4 g/dL (ref 30.0–36.0)
MCV: 87.7 fL (ref 80.0–100.0)
Platelets: 362 10*3/uL (ref 150–400)
RBC: 4.47 MIL/uL (ref 3.87–5.11)
RDW: 13.3 % (ref 11.5–15.5)
WBC: 18.6 10*3/uL — ABNORMAL HIGH (ref 4.0–10.5)
nRBC: 0 % (ref 0.0–0.2)

## 2022-07-27 LAB — LACTIC ACID, PLASMA: Lactic Acid, Venous: 3 mmol/L (ref 0.5–1.9)

## 2022-07-27 LAB — RESP PANEL BY RT-PCR (FLU A&B, COVID) ARPGX2
Influenza A by PCR: NEGATIVE
Influenza B by PCR: NEGATIVE
SARS Coronavirus 2 by RT PCR: NEGATIVE

## 2022-07-27 LAB — MAGNESIUM: Magnesium: 1.4 mg/dL — ABNORMAL LOW (ref 1.7–2.4)

## 2022-07-27 LAB — LIPASE, BLOOD: Lipase: 25 U/L (ref 11–51)

## 2022-07-27 MED ORDER — ONDANSETRON HCL 4 MG PO TABS: 4.0000 mg | ORAL_TABLET | Freq: Four times a day (QID) | ORAL | Status: DC | PRN

## 2022-07-27 MED ORDER — HYDRALAZINE HCL 20 MG/ML IJ SOLN: 5.0000 mg | INTRAMUSCULAR | Status: DC | PRN

## 2022-07-27 MED ORDER — POTASSIUM CHLORIDE CRYS ER 20 MEQ PO TBCR
20.0000 meq | EXTENDED_RELEASE_TABLET | Freq: Every day | ORAL | Status: DC
  Administered 2022-07-27 – 2022-07-30 (×4): 20 meq via ORAL
  Filled 2022-07-27 (×4): qty 1

## 2022-07-27 MED ORDER — POLYVINYL ALCOHOL 1.4 % OP SOLN
1.0000 [drp] | Freq: Every day | OPHTHALMIC | Status: DC | PRN
Start: 2022-07-27 — End: 2022-07-30

## 2022-07-27 MED ORDER — ONDANSETRON HCL 4 MG/2ML IJ SOLN
4.0000 mg | Freq: Four times a day (QID) | INTRAMUSCULAR | Status: DC | PRN
  Administered 2022-07-28 – 2022-07-29 (×3): 4 mg via INTRAVENOUS
  Filled 2022-07-27 (×3): qty 2

## 2022-07-27 MED ORDER — ENSURE ENLIVE PO LIQD
237.0000 mL | Freq: Three times a day (TID) | ORAL | Status: DC
  Administered 2022-07-27 – 2022-07-30 (×9): 237 mL via ORAL
  Filled 2022-07-27 (×2): qty 237

## 2022-07-27 MED ORDER — LORAZEPAM 1 MG PO TABS: 0.5000 mg | ORAL_TABLET | Freq: Every day | ORAL | Status: DC

## 2022-07-27 MED ORDER — ASPIRIN 81 MG PO TBEC
81.0000 mg | DELAYED_RELEASE_TABLET | Freq: Every day | ORAL | Status: DC
  Administered 2022-07-27 – 2022-07-30 (×4): 81 mg via ORAL
  Filled 2022-07-27 (×4): qty 1

## 2022-07-27 MED ORDER — ACETAMINOPHEN 325 MG PO TABS
650.0000 mg | ORAL_TABLET | Freq: Four times a day (QID) | ORAL | Status: DC | PRN
  Administered 2022-07-28 – 2022-07-30 (×5): 650 mg via ORAL
  Filled 2022-07-27 (×5): qty 2

## 2022-07-27 MED ORDER — SODIUM CHLORIDE 0.9% FLUSH
3.0000 mL | Freq: Two times a day (BID) | INTRAVENOUS | Status: DC
  Administered 2022-07-27 – 2022-07-29 (×6): 3 mL via INTRAVENOUS

## 2022-07-27 MED ORDER — LORAZEPAM 2 MG/ML IJ SOLN: 0.5000 mg | Freq: Once | INTRAMUSCULAR | Status: DC

## 2022-07-27 MED ORDER — POTASSIUM CHLORIDE 10 MEQ/100ML IV SOLN
10.0000 meq | Freq: Once | INTRAVENOUS | Status: AC
  Administered 2022-07-27: 10 meq via INTRAVENOUS

## 2022-07-27 MED ORDER — ATORVASTATIN CALCIUM 80 MG PO TABS
80.0000 mg | ORAL_TABLET | Freq: Every day | ORAL | Status: DC
  Administered 2022-07-27 – 2022-07-28 (×2): 80 mg via ORAL
  Filled 2022-07-27: qty 1
  Filled 2022-07-27: qty 2

## 2022-07-27 MED ORDER — SODIUM CHLORIDE 0.9 % IV BOLUS
500.0000 mL | Freq: Once | INTRAVENOUS | Status: AC
  Administered 2022-07-27: 500 mL via INTRAVENOUS

## 2022-07-27 MED ORDER — LACTATED RINGERS IV SOLN: INTRAVENOUS | Status: DC

## 2022-07-27 MED ORDER — METOPROLOL SUCCINATE ER 50 MG PO TB24
50.0000 mg | ORAL_TABLET | Freq: Two times a day (BID) | ORAL | Status: DC
  Administered 2022-07-27 – 2022-07-30 (×7): 50 mg via ORAL
  Filled 2022-07-27: qty 1
  Filled 2022-07-27: qty 2
  Filled 2022-07-27: qty 1
  Filled 2022-07-27: qty 2
  Filled 2022-07-27 (×3): qty 1

## 2022-07-27 MED ORDER — SODIUM CHLORIDE 0.9 % IV SOLN
500.0000 mg | Freq: Once | INTRAVENOUS | Status: DC
  Filled 2022-07-27: qty 5

## 2022-07-27 MED ORDER — SODIUM CHLORIDE 0.9 % IV SOLN
1.0000 g | Freq: Once | INTRAVENOUS | Status: DC
  Filled 2022-07-27: qty 10

## 2022-07-27 MED ORDER — ENOXAPARIN SODIUM 40 MG/0.4ML IJ SOSY
40.0000 mg | PREFILLED_SYRINGE | INTRAMUSCULAR | Status: DC
  Administered 2022-07-27 – 2022-07-30 (×4): 40 mg via SUBCUTANEOUS
  Filled 2022-07-27 (×5): qty 0.4

## 2022-07-27 MED ORDER — CLONIDINE HCL 0.1 MG PO TABS
0.1000 mg | ORAL_TABLET | Freq: Two times a day (BID) | ORAL | Status: DC
  Administered 2022-07-27 – 2022-07-28 (×3): 0.1 mg via ORAL
  Filled 2022-07-27 (×4): qty 1

## 2022-07-27 MED ORDER — POTASSIUM CHLORIDE 10 MEQ/100ML IV SOLN
10.0000 meq | INTRAVENOUS | Status: DC
  Filled 2022-07-27: qty 100

## 2022-07-27 MED ORDER — POLYETHYLENE GLYCOL 3350 17 G PO PACK
17.0000 g | PACK | Freq: Every day | ORAL | Status: DC | PRN
Start: 2022-07-27 — End: 2022-07-30

## 2022-07-27 MED ORDER — ACETAMINOPHEN 650 MG RE SUPP: 650.0000 mg | Freq: Four times a day (QID) | RECTAL | Status: DC | PRN

## 2022-07-27 MED ORDER — MAGNESIUM OXIDE -MG SUPPLEMENT 400 (240 MG) MG PO TABS
400.0000 mg | ORAL_TABLET | Freq: Every day | ORAL | Status: DC
  Administered 2022-07-27 – 2022-07-28 (×2): 400 mg via ORAL
  Filled 2022-07-27 (×3): qty 1

## 2022-07-27 MED ORDER — IOHEXOL 350 MG/ML SOLN
80.0000 mL | Freq: Once | INTRAVENOUS | Status: AC | PRN
  Administered 2022-07-27: 80 mL via INTRAVENOUS

## 2022-07-27 MED ORDER — LEVOTHYROXINE SODIUM 75 MCG PO TABS
75.0000 ug | ORAL_TABLET | Freq: Every day | ORAL | Status: DC
  Administered 2022-07-28 – 2022-07-30 (×3): 75 ug via ORAL
  Filled 2022-07-27 (×3): qty 1

## 2022-07-27 MED ORDER — LORAZEPAM 0.5 MG PO TABS
0.5000 mg | ORAL_TABLET | Freq: Every day | ORAL | Status: DC
  Administered 2022-07-27 – 2022-07-29 (×3): 0.5 mg via ORAL
  Filled 2022-07-27 (×3): qty 1

## 2022-07-27 MED ORDER — LABETALOL HCL 5 MG/ML IV SOLN: 20.0000 mg | Freq: Once | INTRAVENOUS | Status: DC

## 2022-07-27 MED ORDER — LORAZEPAM 2 MG/ML IJ SOLN
2.0000 mg | Freq: Once | INTRAMUSCULAR | Status: AC
  Administered 2022-07-27: 2 mg via INTRAMUSCULAR
  Filled 2022-07-27: qty 1

## 2022-07-27 MED ORDER — DOCUSATE SODIUM 100 MG PO CAPS
100.0000 mg | ORAL_CAPSULE | Freq: Two times a day (BID) | ORAL | Status: DC
  Administered 2022-07-27 – 2022-07-28 (×3): 100 mg via ORAL
  Filled 2022-07-27 (×3): qty 1

## 2022-07-27 MED ORDER — HALOPERIDOL LACTATE 5 MG/ML IJ SOLN
2.0000 mg | Freq: Four times a day (QID) | INTRAMUSCULAR | Status: DC | PRN
Start: 2022-07-27 — End: 2022-07-30
  Administered 2022-07-28 – 2022-07-29 (×2): 2 mg via INTRAVENOUS
  Filled 2022-07-27 (×2): qty 1

## 2022-07-27 MED ORDER — BISACODYL 5 MG PO TBEC: 5.0000 mg | DELAYED_RELEASE_TABLET | Freq: Every day | ORAL | Status: DC | PRN

## 2022-07-27 MED ORDER — DOXYCYCLINE HYCLATE 100 MG PO TABS: 100.0000 mg | ORAL_TABLET | Freq: Once | ORAL | Status: DC

## 2022-07-27 MED ORDER — PANTOPRAZOLE SODIUM 40 MG PO TBEC
40.0000 mg | DELAYED_RELEASE_TABLET | Freq: Two times a day (BID) | ORAL | Status: DC
  Administered 2022-07-27 – 2022-07-28 (×3): 40 mg via ORAL
  Filled 2022-07-27 (×3): qty 1

## 2022-07-27 MED ORDER — HYDRALAZINE HCL 25 MG PO TABS
25.0000 mg | ORAL_TABLET | Freq: Three times a day (TID) | ORAL | Status: DC
  Administered 2022-07-27 – 2022-07-28 (×3): 25 mg via ORAL
  Filled 2022-07-27 (×3): qty 1

## 2022-07-27 NOTE — Consult Note (Signed)
  Patient seen and chart reviewed-patient is interviewed this morning with no family members at the bedside.  Patient is an overall poor historian and is unable to provide answers to most history questions.  Patient is oriented to self and hospital and her current presentation is appropriate as she is pleasant and jovial. She did sit up and smile when I introduced self "smiling because you are from psychiatry. What do you need to talk to me about? " She is unable to contribute to any previous history, she attempts to answer all questions with " well I know" however it becomes repetitive and she is unable to state any further words outside of that. She is not oriented to city, situation, year or month. Patient describes her mood as "good".  She denies any anxiety or panic attacks at home. She does not recognize the name of Prozac, also used generic name of fluoxetine. Patient tells me she came to the hospital because " I had a headache and was getting dizzy." Patient denies SI/HI/visual or auditory hallucinations.   Will attempt to call son and get collateral. Patient with fluctuating levels of cognition. She initially presented for anxiety, however denies such. Unclear why fluoxetine was discontinued however can be resumed if warranted. Prozac can be used for management of anxiety.   Collateral obtained from son [name]): he reports her anxiety and panic attacks, usually present as rapid breathing, "gets winded up and then has a headache, becomes nauseous and vomiting. " He states this has been ongoing for a couple of months, but seems to have worsened since returning from the rehab facility. Goal of rehab was to assist with deconditioning and physical training, she was able to regain some strength. He is unclear as to why Prozac was stopped in the facility. " It was no longer on my paper when she discharged." He reports she used to drive daily to the [facility] in the spring of 2023. She is no longer independent as  much " she does have much confidence anymore. She wants someone to drive her to the [facility] or food place. She stopped cooking awhile a back. " He reports assisting her to her appointments. He denies any additional concerns with the exception that he is unable to provide her with the level of care she needs. He would like too pursue assisted living options. We did discuss progression of mild cognitive impairment, in the setting of stroke.     -Patient is being admitted for medical work up for dehydration.  -Labs with WBC 18.6, no evidence of infection at this time.  *CT scan of Head obtained on 08/21 shows Chronic microvascular ischemic change and cerebral volume loss. Patient with history of hypertension, and increased risk factors for stroke, hemorrhage, and dementia.  -Consider additional studies and neuro consult if patient continues to present with confusion, dizziness, and headaches. Hx of stroke.  -Will not recommend no additional medication recommendations at this time. If anxiety and or behavioral disturbances continue to be a concern, recommend starting Citalopram '10mg'$  po daily with upward titration to target anxiety.  - Psych consult service to sign off at this time. Please consult if new symptoms do arise, and or worsen.  -

## 2022-07-27 NOTE — Evaluation (Signed)
Physical Therapy Evaluation Patient Details Name: MARIEELENA BARTKO MRN: [id number] DOB: 1946-11-17 Today's Date: 07/27/2022  History of Present Illness  Pt is a 76 y.o. F who presents 07/26/2022 with N/V, HA, and chest pain. CT scans largely unremarkable except for possible PNA. Significant PMH: breast cancer, chronic diastolic CHF, HTN, HLD, anxiety/depression, stage 3B CKD, and hypothyroidism.  Clinical Impression  Pt with recent d/c from [facility] to home. Pt is likely fairly close to her functional baseline and is mobilizing well. Ambulating 150 ft with a walker at a supervision level. Demonstrates mild balance deficits and gait abnormalities. Would benefit from follow up HHPT to address.      Recommendations for follow up therapy are one component of a multi-disciplinary discharge planning process, led by the attending physician.  Recommendations may be updated based on patient status, additional functional criteria and insurance authorization.  Follow Up Recommendations Home health PT      Assistance Recommended at Discharge PRN  Patient can return home with the following  Assistance with cooking/housework;Assist for transportation;Help with stairs or ramp for entrance    Equipment Recommendations None recommended by PT  Recommendations for Other Services       Functional Status Assessment Patient has had a recent decline in their functional status and demonstrates the ability to make significant improvements in function in a reasonable and predictable amount of time.     Precautions / Restrictions Precautions Precautions: Fall Restrictions Weight Bearing Restrictions: No      Mobility  Bed Mobility Overal bed mobility: Needs Assistance Bed Mobility: Supine to Sit, Sit to Supine     Supine to sit: Min assist Sit to supine: Min assist   General bed mobility comments: Min assist for trunk support to sit up and for LE management back into bed (likely due to  stretcher height)    Transfers Overall transfer level: Needs assistance Equipment used: Rolling walker (2 wheels) Transfers: Sit to/from Stand Sit to Stand: Supervision                Ambulation/Gait Ambulation/Gait assistance: Supervision Gait Distance (Feet): 150 Feet Assistive device: Rolling walker (2 wheels) Gait Pattern/deviations: Step-through pattern, Decreased stride length, Decreased dorsiflexion - right, Decreased dorsiflexion - left       General Gait Details: supervision for safety, decreased bilateral foot strike and R foot clearance  Stairs            Wheelchair Mobility    Modified Rankin (Stroke Patients Only)       Balance Overall balance assessment: Mild deficits observed, not formally tested                                           Pertinent Vitals/Pain Pain Assessment Pain Assessment: No/denies pain    Home Living Family/patient expects to be discharged to:: Private residence Living Arrangements: Children Available Help at Discharge: Family;Available 24 hours/day Type of Home: House Home Access: Stairs to enter Entrance Stairs-Rails: None Entrance Stairs-Number of Steps: 2 Alternate Level Stairs-Number of Steps: 1/2 flight + landing + 1/2 flight Home Layout: Two level;Bed/bath upstairs Home Equipment: Conservation officer, nature (2 wheels);Cane - quad Additional Comments: son & daughter in law work from home    Prior Function Prior Level of Function : Independent/Modified Independent;History of Falls (last six months);Patient poor historian/Family not available  Mobility Comments: Pt reports she would drive to/from the [facility] because it was close to her house, goes to the [facility] 5x/week (states she did not go recently due to heat outside). Pt has been using RW ADLs Comments: Pt states she is independent with bathing and dressing. Pt reports minimal cooking (mostly microwave).     Hand Dominance   Dominant  Hand: Right    Extremity/Trunk Assessment   Upper Extremity Assessment Upper Extremity Assessment: Defer to OT evaluation    Lower Extremity Assessment Lower Extremity Assessment: Overall WFL for tasks assessed    Cervical / Trunk Assessment Cervical / Trunk Assessment: Normal  Communication   Communication: No difficulties  Cognition Arousal/Alertness: Awake/alert Behavior During Therapy: WFL for tasks assessed/performed Overall Cognitive Status: Within Functional Limits for tasks assessed                                 General Comments: WFL for basic mobility        General Comments      Exercises     Assessment/Plan    PT Assessment Patient needs continued PT services  PT Problem List Decreased strength;Decreased activity tolerance;Decreased balance;Decreased mobility       PT Treatment Interventions DME instruction;Gait training;Stair training;Functional mobility training;Therapeutic activities;Therapeutic exercise;Balance training;Patient/family education    PT Goals (Current goals can be found in the Care Plan section)  Acute Rehab PT Goals Patient Stated Goal: agreeable to HHPT PT Goal Formulation: With patient Time For Goal Achievement: 08/10/22 Potential to Achieve Goals: Good    Frequency Min 3X/week     Co-evaluation               AM-PAC PT "6 Clicks" Mobility  Outcome Measure Help needed turning from your back to your side while in a flat bed without using bedrails?: None Help needed moving from lying on your back to sitting on the side of a flat bed without using bedrails?: A Little Help needed moving to and from a bed to a chair (including a wheelchair)?: A Little Help needed standing up from a chair using your arms (e.g., wheelchair or bedside chair)?: A Little Help needed to walk in hospital room?: A Little Help needed climbing 3-5 steps with a railing? : A Little 6 Click Score: 19    End of Session Equipment  Utilized During Treatment: Gait belt Activity Tolerance: Patient tolerated treatment well Patient left: in bed;with call bell/phone within reach Nurse Communication: Mobility status PT Visit Diagnosis: Unsteadiness on feet (R26.81);Difficulty in walking, not elsewhere classified (R26.2)    Time: 6599-3570 PT Time Calculation (min) (ACUTE ONLY): 12 min   Charges:   PT Evaluation $PT Eval Low Complexity: [place], PT, DPT Acute Rehabilitation Services Office [phone]   Deno Etienne 07/27/2022, 5:12 PM

## 2022-07-27 NOTE — H&P (Signed)
History and Physical    Patient: Cindy Huerta TIR:[id number] DOB: 1946/03/28 DOA: 07/26/2022 DOS: the patient was seen and examined on 07/27/2022 PCP: Juluis Pitch, MD  Patient coming from: Home - lives with son and DIL; NOK:  Amantha, Sklar, [phone]   Chief Complaint: nausea/vomiting  HPI: ZANIAH TITTERINGTON is a 76 y.o. female with medical history significant of breast cancer, chronic diastolic CHF, HTN, HLD, anxiety/depression, stage 3B CKD, and hypothyroidism presenting with n/v. She was last admitted from 7/24-27 with AKI, hyponatremia, and acute urinary retention.  She has been back home from [facility] for about 2 weeks.  She reports that her head has been hurting.  She has been having n/v for a week or so.  No chest pain.  Slight cough.  No fever.  She feels ok now.  I called and spoke with her son.  He reports that she went to [facility] for rehab and then she returned home.  It started off ok at first.  Wilburn Mylar, she had [facility] evaluation.  They started asking questions and she had an anxiety attack.  He is trying to get her connected with an International aid/development worker from [place].  He thinks her anxiety is her big issue right now.  He didn't think she needed to come to the ER but she was convinced she needed to come to the ER.  She got more and more anxious and had more n/v.  He is unsure whether they is a dementia component; one of her sisters had dementia and she has always been concerned about that as well.  He would definitely be interested in having a psychiatric evaluation while she is here.      ER Course:  Carryover, per Dr. Hal Hope:  76 year old female with history of hypertension, CHF presents with nausea vomiting headache chest pain.  CT scans were largely unremarkable except for possible pneumonia.  Was started on empiric antibiotics and COVID test pending.  Patient also has high anion gap acidosis repeat metabolic is pending after fluids were given.  Blood  pressure was elevated admission improved without any intervention.     Review of Systems: As mentioned in the history of present illness. All other systems reviewed and are negative. Past Medical History:  Diagnosis Date   Arthritis    Breast cancer ([place])    Depression    GERD (gastroesophageal reflux disease)    HLD (hyperlipidemia)    Hypertension    Hypothyroidism    Past Surgical History:  Procedure Laterality Date   ABDOMINAL HYSTERECTOMY  1982   AXILLARY SENTINEL NODE BIOPSY Left 11/20/2021   Procedure: AXILLARY SENTINEL NODE BIOPSY;  Surgeon: Benjamine Sprague, DO;  Location: [facility] ORS;  Service: General;  Laterality: Left;   BREAST BIOPSY Left 09/23/2021   3:00 13cmfn venus marker, path pending   BREAST BIOPSY Left 09/23/2021   3:00 14 cmfn heart marker, path pending   CHOLECYSTECTOMY     ESOPHAGOGASTRODUODENOSCOPY (EGD) WITH PROPOFOL N/A 10/08/2021   Procedure: ESOPHAGOGASTRODUODENOSCOPY (EGD) WITH PROPOFOL;  Surgeon: Jonathon Bellows, MD;  Location: [facility] ENDOSCOPY;  Service: Gastroenterology;  Laterality: N/A;   HEMATOMA EVACUATION Left 11/21/2021   Procedure: EVACUATION HEMATOMA;  Surgeon: Benjamine Sprague, DO;  Location: [facility] ORS;  Service: General;  Laterality: Left;   left lumpectomy  2019   TOTAL MASTECTOMY Left 11/20/2021   Procedure: TOTAL MASTECTOMY;  Surgeon: Benjamine Sprague, DO;  Location: [facility] ORS;  Service: General;  Laterality: Left;   Social History:  reports that she  has never smoked. She has never used smokeless tobacco. She reports that she does not drink alcohol and does not use drugs.  Allergies  Allergen Reactions   Latex Rash   Novocain [Procaine] Other (See Comments)    Unsure - told by DDS not to let anyone give it to her  Confusion    Zestril [Lisinopril] Cough   Benadryl [Diphenhydramine] Other (See Comments)    Jitteriness Agitation   Biaxin [Clarithromycin] Other (See Comments)    Confusion     Oxycodone Nausea And Vomiting and Anxiety     Family History  Problem Relation Age of Onset   Heart disease Mother    Cancer Paternal Grandmother     Prior to Admission medications   Medication Sig Start Date End Date Taking? Authorizing Provider  acetaminophen (TYLENOL) 500 MG tablet Take 500-1,000 mg by mouth every 6 (six) hours as needed for moderate pain.    [provider]  alum & mag hydroxide-simeth (MAALOX/MYLANTA) 200-200-20 MG/5ML suspension Take 30 mLs by mouth every 6 (six) hours as needed for indigestion or heartburn.    [provider]  ARTIFICIAL TEAR SOLUTION OP Place 1 drop into both eyes daily as needed (dry eyes).    [provider]  aspirin 81 MG EC tablet Take 1 tablet (81 mg total) by mouth daily. RESTART 48HRS AFTER DISCHARGE 11/25/21   Lysle Pearl, Isami, DO  atorvastatin (LIPITOR) 80 MG tablet Take 80 mg by mouth daily. 09/21/21   [provider]  cloNIDine (CATAPRES) 0.1 MG tablet Take 0.1 mg by mouth 2 (two) times daily. 05/16/22   [provider]  docusate sodium (COLACE) 100 MG capsule Take 1 capsule (100 mg total) by mouth 2 (two) times daily. 07/01/22   Danford, Suann Larry, MD  feeding supplement (ENSURE ENLIVE / ENSURE PLUS) LIQD Take 237 mLs by mouth 3 (three) times daily between meals. 07/01/22   Danford, Suann Larry, MD  fluticasone (FLONASE) 50 MCG/ACT nasal spray Place 1 spray into both nostrils daily as needed for allergies or rhinitis.    [provider]  hydrALAZINE (APRESOLINE) 25 MG tablet Take 25 mg by mouth 3 (three) times daily. 02/04/21   [provider]  levothyroxine (SYNTHROID) 75 MCG tablet Take 75 mcg by mouth daily before breakfast. 02/10/21   [provider]  LORazepam (ATIVAN) 0.5 MG tablet Take 1 tablet (0.5 mg total) by mouth daily. 07/01/22   Danford, Suann Larry, MD  magnesium oxide (MAG-OX) 400 MG tablet Take 400 mg by mouth daily.    [provider]  meclizine (ANTIVERT) 12.5 MG tablet Take 12.5 mg by  mouth 3 (three) times daily as needed for dizziness.    [provider]  metoprolol succinate (TOPROL-XL) 50 MG 24 hr tablet Take 50 mg by mouth 2 (two) times daily. 03/18/21   [provider]  Multiple Vitamins-Minerals (THERA-M) TABS Take 1 tablet by mouth daily.    [provider]  pantoprazole (PROTONIX) 40 MG tablet Take 1 tablet (40 mg total) by mouth daily. Patient taking differently: Take 40 mg by mouth 2 (two) times daily. 10/10/21 06/28/22  Sidney Ace, MD  potassium chloride SA (KLOR-CON) 20 MEQ tablet Take 20 mEq by mouth daily.    [provider]  traMADol (ULTRAM) 50 MG tablet Take 1 tablet (50 mg total) by mouth every 6 (six) hours as needed for up to 10 doses for moderate pain. 07/01/22   Danford, Suann Larry, MD  trolamine salicylate (ASPERCREME)  10 % cream Apply 1 application topically as needed for muscle pain.    [provider]  vitamin B-12 (CYANOCOBALAMIN) 100 MCG tablet Take 100 mg by mouth 2 (two) times daily.    [provider]    Physical Exam: Vitals:   07/27/22 0445 07/27/22 0522 07/27/22 0545 07/27/22 0638  BP: (!) 178/89 (!) 161/72 (!) 167/91   Pulse: 87 89 68 93  Resp: '18 18 19 17  '$ Temp:  98.9 F (37.2 C)    TempSrc:  Rectal    SpO2: 99% 99% 98% 99%  Weight:      Height:       General:  Appears calm and comfortable and is in NAD Eyes:  PERRL, EOMI, normal lids, iris ENT:  grossly normal hearing, lips & tongue, mmm Neck:  no LAD, masses or thyromegaly Cardiovascular:  RRR, no m/r/g. No LE edema.  Respiratory:   CTA bilaterally with no wheezes/rales/rhonchi.  Normal respiratory effort. Abdomen:  soft, NT, ND Skin:  no rash or induration seen on limited exam Musculoskeletal:  grossly normal tone BUE/BLE, good ROM, no bony abnormality Psychiatric:  mildly anxious mood and affect, speech fluent and appropriate, AOx3 Neurologic:  CN 2-12 grossly intact, moves all extremities in coordinated  fashion   Radiological Exams on Admission: Independently reviewed - see discussion in A/P where applicable  CT Angio Chest/Abd/Pel for Dissection W and/or W/WO  Result Date: 07/27/2022 CLINICAL DATA:  76 year old female with history of chest and back pain. Suspected aortic dissection. EXAM: CT ANGIOGRAPHY CHEST, ABDOMEN AND PELVIS TECHNIQUE: Non-contrast CT of the chest was initially obtained. Multidetector CT imaging through the chest, abdomen and pelvis was performed using the standard protocol during bolus administration of intravenous contrast. Multiplanar reconstructed images and MIPs were obtained and reviewed to evaluate the vascular anatomy. RADIATION DOSE REDUCTION: This exam was performed according to the departmental dose-optimization program which includes automated exposure control, adjustment of the mA and/or kV according to patient size and/or use of iterative reconstruction technique. CONTRAST:  24m OMNIPAQUE IOHEXOL 350 MG/ML SOLN COMPARISON:  CT the abdomen and pelvis 06/18/2022. Chest CTA 12/01/2021. FINDINGS: CTA CHEST FINDINGS Cardiovascular: Precontrast images demonstrate no crescentic high attenuation associated with the wall of the thoracic aorta to suggest acute intramural hemorrhage. Postcontrast images demonstrate no evidence of thoracic aortic aneurysm or dissection. Thoracic aorta is normal in caliber measuring 3.1 cm, 2.6 cm and 2.4 cm in diameter in the ascending, mid arch and descending thoracic aorta respectively. There is aortic atherosclerosis, as well as atherosclerosis of the great vessels of the mediastinum and the coronary arteries, including calcified atherosclerotic plaque in the left anterior descending coronary artery. Heart size is normal. There is no significant pericardial fluid, thickening or pericardial calcification. Mediastinum/Nodes: No pathologically enlarged mediastinal or hilar lymph nodes. Small hiatal hernia. Esophagus is otherwise unremarkable in  appearance. No axillary lymphadenopathy. Lungs/Pleura: New area of ground-glass attenuation in the central left upper lobe best appreciated on axial image 70 of series 9, not evident on prior exam from 12/01/2021, presumably of infectious or inflammatory etiology. Right lung is clear. No pleural effusions. No definite suspicious appearing pulmonary nodules or masses are noted. Musculoskeletal: Status post left modified radical mastectomy. There are no aggressive appearing lytic or blastic lesions noted in the visualized portions of the skeleton. Review of the MIP images confirms the above findings. CTA ABDOMEN AND PELVIS FINDINGS VASCULAR Aorta: Normal caliber aorta without aneurysm, dissection, vasculitis or significant stenosis. Celiac: Patent without evidence of aneurysm, dissection,  vasculitis or significant stenosis. SMA: Patent without evidence of aneurysm, dissection, vasculitis or significant stenosis. Renals: Both renal arteries are patent without evidence of aneurysm, dissection, vasculitis, fibromuscular dysplasia or significant stenosis. IMA: Patent without evidence of aneurysm, dissection, vasculitis or significant stenosis. Inflow: Patent without evidence of aneurysm, dissection, vasculitis or significant stenosis. Veins: None. Review of the MIP images confirms the above findings. NON-VASCULAR Hepatobiliary: No suspicious cystic or solid hepatic lesions. No intra or extrahepatic biliary ductal dilatation. Status post cholecystectomy. Pancreas: No pancreatic mass. No pancreatic ductal dilatation. No pancreatic or peripancreatic fluid collections or inflammatory changes. Spleen: Unremarkable. Adrenals/Urinary Tract: Bilateral kidneys and adrenal glands are normal in appearance. No hydroureteronephrosis. Urinary bladder is mildly distended, but otherwise unremarkable in appearance. Stomach/Bowel: Intra-abdominal portion of the stomach is unremarkable. No pathologic dilatation of small bowel or colon.  Numerous colonic diverticulae are noted, particularly in the descending colon, without surrounding inflammatory changes to indicate an acute diverticulitis at this time. The appendix is not confidently identified and may be surgically absent. Regardless, there are no inflammatory changes noted adjacent to the cecum to suggest the presence of an acute appendicitis at this time. Lymphatic: No lymphadenopathy noted in the abdomen or pelvis. Reproductive: Status post hysterectomy. Ovaries are not confidently identified may be surgically absent or atrophic. Other: No significant volume of ascites.  No pneumoperitoneum. Musculoskeletal: There are no aggressive appearing lytic or blastic lesions noted in the visualized portions of the skeleton. Review of the MIP images confirms the above findings. IMPRESSION: 1. No acute abnormality of the thoracoabdominal aorta. Specifically, no evidence of acute aortic syndrome. 2. Focus of ground-glass attenuation in the central aspect of the left upper lobe, new compared to the prior examination, presumably of infectious or inflammatory etiology. Clinical correlation for signs and symptoms of developing bronchopneumonia is recommended. 3. Severe colonic diverticulosis without evidence of acute diverticulitis at this time. 4. Aortic atherosclerosis, in addition to left anterior descending coronary artery disease. Assessment for potential risk factor modification, dietary therapy or pharmacologic therapy may be warranted, if clinically indicated. 5. Additional incidental findings, as above. Electronically Signed   By: Vinnie Langton M.D.   On: 07/27/2022 05:26   DG Chest Portable 1 View  Result Date: 07/27/2022 CLINICAL DATA:  Chest pain. EXAM: PORTABLE CHEST 1 VIEW COMPARISON:  Chest CT dated 12/01/2021. FINDINGS: No focal consolidation, pleural effusion, pneumothorax. Mild cardiomegaly. No acute osseous pathology. IMPRESSION: 1. No acute cardiopulmonary process. 2. Mild  cardiomegaly. Electronically Signed   By: Anner Crete M.D.   On: 07/27/2022 01:30   CT Head Wo Contrast  Result Date: 07/26/2022 CLINICAL DATA:  Headache, new or worsening (Age >= 50y) EXAM: CT HEAD WITHOUT CONTRAST TECHNIQUE: Contiguous axial images were obtained from the base of the skull through the vertex without intravenous contrast. RADIATION DOSE REDUCTION: This exam was performed according to the departmental dose-optimization program which includes automated exposure control, adjustment of the mA and/or kV according to patient size and/or use of iterative reconstruction technique. COMPARISON:  06/28/2022 FINDINGS: Brain: No evidence of acute infarction, hemorrhage, hydrocephalus, extra-axial collection or mass lesion/mass effect. 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 Patchy low-density changes within the periventricular and subcortical white matter compatible with chronic microvascular ischemic change. Mild diffuse cerebral volume loss. Vascular: Atherosclerotic calcifications involving the large vessels of the skull base. No unexpected hyperdense vessel. Skull: Normal. Negative for fracture or focal lesion. Sinuses/Orbits: No acute finding. Other: None. IMPRESSION: 1. No acute intracranial abnormality. 2. Chronic microvascular ischemic change and  cerebral volume loss. Electronically Signed   By: Davina Poke D.O.   On: 07/26/2022 18:11    EKG: Independently reviewed.  NSR with rate 73; no evidence of acute ischemia   Labs on Admission: I have personally reviewed the available labs and imaging studies at the time of the admission.  Pertinent labs:    VBG: 7.501/31.6/24.7 Na++ 127 -> 129 K+ 2.9 -> 3.4 Glucose 175 -> 120 CO2 17 -> 22 BUN 13/Creatinine 1.3/GFR 42 -> 11/0.96/>60 Anion gap 20 -> 15 HS troponin 20, 29 WBC 18.6 UA: small Hgb, 30 protein Lactate 3.0   Assessment and Plan: Principal Problem:   Dehydration Active Problems:   Hyponatremia    Hypokalemia   HLD (hyperlipidemia)   Hypothyroidism   Depression with anxiety   CKD (chronic kidney disease), stage IIIa   Benign essential hypertension   History of TIA (transient ischemic attack)   Nausea & vomiting   HTN (hypertension)   Chronic diastolic CHF (congestive heart failure) (HCC)   DNR (do not resuscitate)    Dehydration -Patient with panic attack yesterday leading to n/v -She had evidence of anion gap metabolic acidosis that is thought to be related to dehydration in the setting of n/v -Will hydrate overnight and monitor on telemetry -Repeat BMP in AM -No current clinical evidence of infection, lactate is likely related to dehydration and antibiotics will not be given  Stage 3a CKD -Slightly worse than baseline on admission due to poor PO intake and recurrent n/v in the setting of worsening anxiety and possible dementia; improving -Recheck BMP in AM   Hyponatremia/hypokalemia -Recurrent, likely hypovolemic and chronic -IVF hydration -K+ repleted, continue Mag Ox -Recheck BMP in AM   Depression with anxiety/panic -This appears to be the biggest issue currently; son believes this was the reason for ER presentation -She is no longer taking fluoextine -Continue Ativan -Psychiatry consulted -Also with concerns for dementia with behavioral disturbance -Delirium precautions -prn Haldol also ordered -PT/OT/ST (cognitive/language)/TOC team consults again requested    Hypothyroidism -Continue Synthroid   HLD (hyperlipidemia) -Continue Lipitor   History of TIA (transient ischemic attack) -Continue Aspirin, Lipitor   HTN (hypertension) -Continue home clonidine, oral hydralazine, Toprol XL -hold telmisartan and Maxzide due to hyponatremia and AKI, consider dc -IV hydralazine as needed    Chronic diastolic CHF (congestive heart failure) ([place]) -Echo on 07/29/2021 showed EF of 55-60% with grade 2 diastolic dysfunction -Appears to be compensated   DNR -I have  discussed code status with the patient's son and the patient would not desire resuscitation and would prefer to die a natural death should that situation arise. -She will need a gold out of facility DNR form at the time of discharge      Advance Care Planning:   Code Status: DNR   Consults: Psychiatry; PT/OT/ST; Nutrition; TOC team  DVT Prophylaxis: Lovenox  Family Communication: None present; I spoke with her son by telephone at the time of admission  Severity of Illness: The appropriate patient status for this patient is OBSERVATION. Observation status is judged to be reasonable and necessary in order to provide the required intensity of service to ensure the patient's safety. The patient's presenting symptoms, physical exam findings, and initial radiographic and laboratory data in the context of their medical condition is felt to place them at decreased risk for further clinical deterioration. Furthermore, it is anticipated that the patient will be medically stable for discharge from the hospital within 2 midnights of admission.   Author: Anderson Malta  Lorin Mercy, MD 07/27/2022 8:22 AM  For on call review [email or web address].

## 2022-07-27 NOTE — ED Notes (Signed)
Provider made aware that pt is having consistent Winward pauses on her EKG. IV infiltrated. Awaiting IV team consult for K admin.

## 2022-07-27 NOTE — ED Notes (Signed)
Patient transported to CT 

## 2022-07-27 NOTE — ED Notes (Signed)
Walked patient to the bathroom patient did well patient is now back in bed on the monitor got patient some warm blankets

## 2022-07-27 NOTE — ED Notes (Signed)
Lunch order placed

## 2022-07-27 NOTE — ED Provider Notes (Signed)
[facility] EMERGENCY DEPARTMENT Provider Note   CSN: [id number] Arrival date & time: 07/26/22  1640     History  Chief Complaint  Patient presents with   Headache   Emesis    Cindy Huerta is a 76 y.o. female with a history of hypertension, hypothyroidism, hyperlipidemia, CKD, breast cancer, and depression who presents to the emergency department with her son for evaluation of  anxiety attack, headache, and vomiting today.  Patient's son provides primary history, relates that around noon today the patient started having an anxiety attack in a stressful situation with her caregiver, she started to breathe very quickly hyperventilating with subsequent nausea and vomiting as well as headache.  This is fairly typical of her prior panic attacks.  Sxs progressed therefore they came to the emergency department.  The nausea medication given to her seem to temporarily help but then symptoms got worse.  Patient complaining of headache, chest pain, and abdominal pain.  States she feels short of breath and has been coughing.  No alleviating or aggravating factors.  She denies change in vision, numbness, focal weakness, hematemesis, melena, or syncope.  HPI     Home Medications Prior to Admission medications   Medication Sig Start Date End Date Taking? Authorizing Provider  acetaminophen (TYLENOL) 500 MG tablet Take 500-1,000 mg by mouth every 6 (six) hours as needed for moderate pain.    [provider]  alum & mag hydroxide-simeth (MAALOX/MYLANTA) 200-200-20 MG/5ML suspension Take 30 mLs by mouth every 6 (six) hours as needed for indigestion or heartburn.    [provider]  ARTIFICIAL TEAR SOLUTION OP Place 1 drop into both eyes daily as needed (dry eyes).    [provider]  aspirin 81 MG EC tablet Take 1 tablet (81 mg total) by mouth daily. RESTART 48HRS AFTER DISCHARGE 11/25/21   Lysle Pearl, Isami, DO  atorvastatin (LIPITOR) 80 MG tablet Take 80 mg by  mouth daily. 09/21/21   [provider]  cloNIDine (CATAPRES) 0.1 MG tablet Take 0.1 mg by mouth 2 (two) times daily. 05/16/22   [provider]  docusate sodium (COLACE) 100 MG capsule Take 1 capsule (100 mg total) by mouth 2 (two) times daily. 07/01/22   Danford, Suann Larry, MD  feeding supplement (ENSURE ENLIVE / ENSURE PLUS) LIQD Take 237 mLs by mouth 3 (three) times daily between meals. 07/01/22   Danford, Suann Larry, MD  fluticasone (FLONASE) 50 MCG/ACT nasal spray Place 1 spray into both nostrils daily as needed for allergies or rhinitis.    [provider]  hydrALAZINE (APRESOLINE) 25 MG tablet Take 25 mg by mouth 3 (three) times daily. 02/04/21   [provider]  levothyroxine (SYNTHROID) 75 MCG tablet Take 75 mcg by mouth daily before breakfast. 02/10/21   [provider]  LORazepam (ATIVAN) 0.5 MG tablet Take 1 tablet (0.5 mg total) by mouth daily. 07/01/22   Danford, Suann Larry, MD  magnesium oxide (MAG-OX) 400 MG tablet Take 400 mg by mouth daily.    [provider]  meclizine (ANTIVERT) 12.5 MG tablet Take 12.5 mg by mouth 3 (three) times daily as needed for dizziness.    [provider]  metoprolol succinate (TOPROL-XL) 50 MG 24 hr tablet Take 50 mg by mouth 2 (two) times daily. 03/18/21   [provider]  Multiple Vitamins-Minerals (THERA-M) TABS Take 1 tablet by mouth daily.    [provider]  pantoprazole (PROTONIX) 40 MG tablet Take 1 tablet (40 mg  total) by mouth daily. Patient taking differently: Take 40 mg by mouth 2 (two) times daily. 10/10/21 06/28/22  Sidney Ace, MD  potassium chloride SA (KLOR-CON) 20 MEQ tablet Take 20 mEq by mouth daily.    [provider]  traMADol (ULTRAM) 50 MG tablet Take 1 tablet (50 mg total) by mouth every 6 (six) hours as needed for up to 10 doses for moderate pain. 07/01/22   Danford, Suann Larry, MD  trolamine salicylate (ASPERCREME) 10 % cream  Apply 1 application topically as needed for muscle pain.    [provider]  vitamin B-12 (CYANOCOBALAMIN) 100 MCG tablet Take 100 mg by mouth 2 (two) times daily.    [provider]      Allergies    Latex, Lisinopril, Procaine, Diphenhydramine hcl, Clarithromycin, and Oxycodone    Review of Systems   Review of Systems  Constitutional:  Negative for chills and fever.  Respiratory:  Positive for cough and shortness of breath.   Cardiovascular:  Positive for chest pain.  Gastrointestinal:  Positive for abdominal pain, nausea and vomiting.  Neurological:  Positive for light-headedness and headaches. Negative for syncope, weakness and numbness.  All other systems reviewed and are negative.   Physical Exam Updated Vital Signs BP (!) 160/90   Pulse 78   Temp 99.1 F (37.3 C)   Resp 17   Ht '5\' 6"'$  (1.676 m)   Wt 70.8 kg   SpO2 98%   BMI 25.18 kg/m  Physical Exam Vitals and nursing note reviewed.  Constitutional:      Appearance: She is well-developed. She is ill-appearing. She is not toxic-appearing.  HENT:     Head: Normocephalic and atraumatic.  Eyes:     General:        Right eye: No discharge.        Left eye: No discharge.     Conjunctiva/sclera: Conjunctivae normal.  Cardiovascular:     Rate and Rhythm: Normal rate and regular rhythm.  Pulmonary:     Effort: Tachypnea present.     Breath sounds: Normal breath sounds. No wheezing or rales.  Chest:     Chest wall: Tenderness present.  Abdominal:     General: There is no distension.     Palpations: Abdomen is soft.     Tenderness: There is abdominal tenderness in the epigastric area.  Musculoskeletal:     Cervical back: Neck supple.  Skin:    General: Skin is warm and dry.  Neurological:     Mental Status: She is alert.     Comments: Slow to answer question at times.  No facial droop.  Sensation grossly intact bilateral upper and lower extremities.  5-5 symmetric grip strength and strength with  plantar dorsiflexion bilaterally.  Psychiatric:        Mood and Affect: Mood is anxious.        Behavior: Behavior normal.     ED Results / Procedures / Treatments   Labs (all labs ordered are listed, but only abnormal results are displayed) Labs Reviewed  CBC - Abnormal; Notable for the following components:      Result Value   WBC 18.6 (*)    All other components within normal limits  COMPREHENSIVE METABOLIC PANEL - Abnormal; Notable for the following components:   Sodium 127 (*)    Potassium 2.9 (*)    Chloride 90 (*)    CO2 17 (*)    Glucose, Bld 175 (*)    Creatinine, Ser  1.30 (*)    Total Protein 9.1 (*)    AST 45 (*)    GFR, Estimated 42 (*)    Anion gap 20 (*)    All other components within normal limits  URINALYSIS, ROUTINE W REFLEX MICROSCOPIC - Abnormal; Notable for the following components:   Color, Urine STRAW (*)    Hgb urine dipstick SMALL (*)    Protein, ur 30 (*)    All other components within normal limits  TROPONIN I (HIGH SENSITIVITY) - Abnormal; Notable for the following components:   Troponin I (High Sensitivity) 20 (*)    All other components within normal limits  TROPONIN I (HIGH SENSITIVITY) - Abnormal; Notable for the following components:   Troponin I (High Sensitivity) 29 (*)    All other components within normal limits  LIPASE, BLOOD    EKG EKG Interpretation  Date/Time:  Monday July 26 2022 17:28:52 EDT Ventricular Rate:  73 PR Interval:  164 QRS Duration: 74 QT Interval:  424 QTC Calculation: 467 R Axis:   55 Text Interpretation: Normal sinus rhythm Normal ECG When compared with ECG of 28-Jun-2022 11:36, PREVIOUS ECG IS PRESENT Confirmed by Gerlene Fee [phone]) on 07/27/2022 2:22:37 AM  Radiology CT Angio Chest/Abd/Pel for Dissection W and/or W/WO  Result Date: 07/27/2022 CLINICAL DATA:  76 year old female with history of chest and back pain. Suspected aortic dissection. EXAM: CT ANGIOGRAPHY CHEST, ABDOMEN AND PELVIS TECHNIQUE:  Non-contrast CT of the chest was initially obtained. Multidetector CT imaging through the chest, abdomen and pelvis was performed using the standard protocol during bolus administration of intravenous contrast. Multiplanar reconstructed images and MIPs were obtained and reviewed to evaluate the vascular anatomy. RADIATION DOSE REDUCTION: This exam was performed according to the departmental dose-optimization program which includes automated exposure control, adjustment of the mA and/or kV according to patient size and/or use of iterative reconstruction technique. CONTRAST:  24m OMNIPAQUE IOHEXOL 350 MG/ML SOLN COMPARISON:  CT the abdomen and pelvis 06/18/2022. Chest CTA 12/01/2021. FINDINGS: CTA CHEST FINDINGS Cardiovascular: Precontrast images demonstrate no crescentic high attenuation associated with the wall of the thoracic aorta to suggest acute intramural hemorrhage. Postcontrast images demonstrate no evidence of thoracic aortic aneurysm or dissection. Thoracic aorta is normal in caliber measuring 3.1 cm, 2.6 cm and 2.4 cm in diameter in the ascending, mid arch and descending thoracic aorta respectively. There is aortic atherosclerosis, as well as atherosclerosis of the great vessels of the mediastinum and the coronary arteries, including calcified atherosclerotic plaque in the left anterior descending coronary artery. Heart size is normal. There is no significant pericardial fluid, thickening or pericardial calcification. Mediastinum/Nodes: No pathologically enlarged mediastinal or hilar lymph nodes. Small hiatal hernia. Esophagus is otherwise unremarkable in appearance. No axillary lymphadenopathy. Lungs/Pleura: New area of ground-glass attenuation in the central left upper lobe best appreciated on axial image 70 of series 9, not evident on prior exam from 12/01/2021, presumably of infectious or inflammatory etiology. Right lung is clear. No pleural effusions. No definite suspicious appearing pulmonary  nodules or masses are noted. Musculoskeletal: Status post left modified radical mastectomy. There are no aggressive appearing lytic or blastic lesions noted in the visualized portions of the skeleton. Review of the MIP images confirms the above findings. CTA ABDOMEN AND PELVIS FINDINGS VASCULAR Aorta: Normal caliber aorta without aneurysm, dissection, vasculitis or significant stenosis. Celiac: Patent without evidence of aneurysm, dissection, vasculitis or significant stenosis. SMA: Patent without evidence of aneurysm, dissection, vasculitis or significant stenosis. Renals: Both renal arteries are patent without evidence  of aneurysm, dissection, vasculitis, fibromuscular dysplasia or significant stenosis. IMA: Patent without evidence of aneurysm, dissection, vasculitis or significant stenosis. Inflow: Patent without evidence of aneurysm, dissection, vasculitis or significant stenosis. Veins: None. Review of the MIP images confirms the above findings. NON-VASCULAR Hepatobiliary: No suspicious cystic or solid hepatic lesions. No intra or extrahepatic biliary ductal dilatation. Status post cholecystectomy. Pancreas: No pancreatic mass. No pancreatic ductal dilatation. No pancreatic or peripancreatic fluid collections or inflammatory changes. Spleen: Unremarkable. Adrenals/Urinary Tract: Bilateral kidneys and adrenal glands are normal in appearance. No hydroureteronephrosis. Urinary bladder is mildly distended, but otherwise unremarkable in appearance. Stomach/Bowel: Intra-abdominal portion of the stomach is unremarkable. No pathologic dilatation of small bowel or colon. Numerous colonic diverticulae are noted, particularly in the descending colon, without surrounding inflammatory changes to indicate an acute diverticulitis at this time. The appendix is not confidently identified and may be surgically absent. Regardless, there are no inflammatory changes noted adjacent to the cecum to suggest the presence of an acute  appendicitis at this time. Lymphatic: No lymphadenopathy noted in the abdomen or pelvis. Reproductive: Status post hysterectomy. Ovaries are not confidently identified may be surgically absent or atrophic. Other: No significant volume of ascites.  No pneumoperitoneum. Musculoskeletal: There are no aggressive appearing lytic or blastic lesions noted in the visualized portions of the skeleton. Review of the MIP images confirms the above findings. IMPRESSION: 1. No acute abnormality of the thoracoabdominal aorta. Specifically, no evidence of acute aortic syndrome. 2. Focus of ground-glass attenuation in the central aspect of the left upper lobe, new compared to the prior examination, presumably of infectious or inflammatory etiology. Clinical correlation for signs and symptoms of developing bronchopneumonia is recommended. 3. Severe colonic diverticulosis without evidence of acute diverticulitis at this time. 4. Aortic atherosclerosis, in addition to left anterior descending coronary artery disease. Assessment for potential risk factor modification, dietary therapy or pharmacologic therapy may be warranted, if clinically indicated. 5. Additional incidental findings, as above. Electronically Signed   By: Vinnie Langton M.D.   On: 07/27/2022 05:26   DG Chest Portable 1 View  Result Date: 07/27/2022 CLINICAL DATA:  Chest pain. EXAM: PORTABLE CHEST 1 VIEW COMPARISON:  Chest CT dated 12/01/2021. FINDINGS: No focal consolidation, pleural effusion, pneumothorax. Mild cardiomegaly. No acute osseous pathology. IMPRESSION: 1. No acute cardiopulmonary process. 2. Mild cardiomegaly. Electronically Signed   By: Anner Crete M.D.   On: 07/27/2022 01:30   CT Head Wo Contrast  Result Date: 07/26/2022 CLINICAL DATA:  Headache, new or worsening (Age >= 50y) EXAM: CT HEAD WITHOUT CONTRAST TECHNIQUE: Contiguous axial images were obtained from the base of the skull through the vertex without intravenous contrast. RADIATION  DOSE REDUCTION: This exam was performed according to the departmental dose-optimization program which includes automated exposure control, adjustment of the mA and/or kV according to patient size and/or use of iterative reconstruction technique. COMPARISON:  06/28/2022 FINDINGS: Brain: No evidence of acute infarction, hemorrhage, hydrocephalus, extra-axial collection or mass lesion/mass effect. 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 Patchy low-density changes within the periventricular and subcortical white matter compatible with chronic microvascular ischemic change. Mild diffuse cerebral volume loss. Vascular: Atherosclerotic calcifications involving the large vessels of the skull base. No unexpected hyperdense vessel. Skull: Normal. Negative for fracture or focal lesion. Sinuses/Orbits: No acute finding. Other: None. IMPRESSION: 1. No acute intracranial abnormality. 2. Chronic microvascular ischemic change and cerebral volume loss. Electronically Signed   By: Davina Poke D.O.   On: 07/26/2022 18:11    Procedures Procedures  Medications Ordered in ED Medications  ondansetron (ZOFRAN-ODT) disintegrating tablet 4 mg (4 mg Oral Given 07/26/22 2148)    ED Course/ Medical Decision Making/ A&P                           Medical Decision Making Amount and/or Complexity of Data Reviewed Labs: ordered. Radiology: ordered.  Risk Prescription drug management. Decision regarding hospitalization.  Patient presents to the ED with complaints of anxiety, and/V, chest and abdominal pain as well as headache, this involves an extensive number of treatment options, and is a complaint that carries with it a high risk of complications and morbidity. Nontoxic however is somewhat ill appearing, vitals with significant hypertension and patient is tachypneic..  Given patient's anxious appearance will give Ativan, she does take this at home.  Fluids ordered.  Additional history obtained:   Chart/nursing notes reviewed,   EKG: Normal sinus rhythm Normal ECG When compared with ECG of 28-Jun-2022 11:36, PREVIOUS ECG IS PRESENT  Lab Tests:  I viewed & interpreted labs including:  CBC: Leukocytosis at 18,600 CMP: Mild increase in creatinine compared to prior, bicarb 17 and gap of 20, only mildly hyperglycemic however at 175 Lipase: Within normal limits Troponins: 20--> 29  Imaging Studies:  I ordered and viewed the following imaging, agree with radiologist impression:  CT head: 1. No acute intracranial abnormality. 2. Chronic microvascular ischemic change and cerebral volume loss.  CXR: 1. No acute cardiopulmonary process. 2. Mild cardiomegaly.  CTA chest/abdomen/pelvis:  1. No acute abnormality of the thoracoabdominal aorta. Specifically, no evidence of acute aortic syndrome. 2. Focus of ground-glass attenuation in the central aspect of the left upper lobe, new compared to the prior examination, presumably of infectious or inflammatory etiology. Clinical correlation for signs and symptoms of developing bronchopneumonia is recommended. 3. Severe colonic diverticulosis without evidence of acute diverticulitis at this time. 4. Aortic atherosclerosis, in addition to left anterior descending coronary artery disease. Assessment for potential risk factor modification, dietary therapy or pharmacologic therapy may be warranted, if clinically indicated. 5. Additional incidental findings, as above.  Patient's hypertension improved some with IM Ativan and resting in the emergency department.  She does have findings of possible bronchopneumonia on CT, given her vomiting earlier there is a question of aspiration pneumonia, however is on left side.  She does have leukocytosis.  We will treat with antibiotics.  Delta troponin mildly elevated, possibly secondary to her hypertension.  CTA without dissection or other acute surgical emergency.  Also has bicarb of 17 with a gap of 20, will check VBG and  lactic acid.  She is hypokalemic will check magnesium and start IV electrolyte replacement.  Will discuss with medicine for admission.   Discussed with hospitalist Dr. Hal Hope, requesting repeat BMP for now, accepts admission.   This is a shared visit with supervising physician Dr. Sedonia Small who has independently evaluated patient & provided guidance in evaluation/management/disposition, in agreement with care   Portions of this note were generated with Dragon dictation software. Dictation errors may occur despite best attempts at proofreading.   Final Clinical Impression(s) / ED Diagnoses Final diagnoses:  Acute pneumonia  Hypokalemia  Nausea and vomiting, unspecified vomiting type  Anxiety  Hypertension, unspecified type    Rx / DC Orders ED Discharge Orders     None         Amaryllis Dyke, PA-C 07/27/22 1324    Maudie Flakes, MD 07/27/22 [phone]

## 2022-07-28 ENCOUNTER — Encounter (HOSPITAL_COMMUNITY): Payer: Self-pay | Admitting: Internal Medicine

## 2022-07-28 DIAGNOSIS — E860 Dehydration: Secondary | ICD-10-CM | POA: Diagnosis not present

## 2022-07-28 LAB — BASIC METABOLIC PANEL
Anion gap: 8 (ref 5–15)
BUN: 16 mg/dL (ref 8–23)
CO2: 27 mmol/L (ref 22–32)
Calcium: 9.4 mg/dL (ref 8.9–10.3)
Chloride: 98 mmol/L (ref 98–111)
Creatinine, Ser: 1.07 mg/dL — ABNORMAL HIGH (ref 0.44–1.00)
GFR, Estimated: 53 mL/min — ABNORMAL LOW (ref >60)
Glucose, Bld: 86 mg/dL (ref 70–99)
Potassium: 3.6 mmol/L (ref 3.5–5.1)
Sodium: 133 mmol/L — ABNORMAL LOW (ref 135–145)

## 2022-07-28 LAB — CBC
HCT: 31.5 % — ABNORMAL LOW (ref 36.0–46.0)
Hemoglobin: 10.7 g/dL — ABNORMAL LOW (ref 12.0–15.0)
MCH: 30.3 pg (ref 26.0–34.0)
MCHC: 34 g/dL (ref 30.0–36.0)
MCV: 89.2 fL (ref 80.0–100.0)
Platelets: 237 10*3/uL (ref 150–400)
RBC: 3.53 MIL/uL — ABNORMAL LOW (ref 3.87–5.11)
RDW: 14 % (ref 11.5–15.5)
WBC: 12.1 10*3/uL — ABNORMAL HIGH (ref 4.0–10.5)
nRBC: 0 % (ref 0.0–0.2)

## 2022-07-28 LAB — LACTIC ACID, PLASMA: Lactic Acid, Venous: 2 mmol/L (ref 0.5–1.9)

## 2022-07-28 MED ORDER — PANTOPRAZOLE SODIUM 40 MG PO TBEC
40.0000 mg | DELAYED_RELEASE_TABLET | Freq: Two times a day (BID) | ORAL | Status: DC
  Administered 2022-07-29 – 2022-07-30 (×3): 40 mg via ORAL
  Filled 2022-07-28 (×3): qty 1

## 2022-07-28 MED ORDER — LACTULOSE 10 GM/15ML PO SOLN
20.0000 g | ORAL | Status: AC
Start: 2022-07-28 — End: 2022-07-28
  Administered 2022-07-28 (×2): 20 g via ORAL
  Filled 2022-07-28 (×2): qty 30

## 2022-07-28 MED ORDER — ORAL CARE MOUTH RINSE: 15.0000 mL | OROMUCOSAL | Status: DC | PRN

## 2022-07-28 MED ORDER — SENNOSIDES-DOCUSATE SODIUM 8.6-50 MG PO TABS
1.0000 | ORAL_TABLET | Freq: Two times a day (BID) | ORAL | Status: DC
  Administered 2022-07-28 – 2022-07-30 (×4): 1 via ORAL
  Filled 2022-07-28 (×4): qty 1

## 2022-07-28 MED ORDER — CLONIDINE HCL 0.2 MG/24HR TD PTWK
0.2000 mg | MEDICATED_PATCH | TRANSDERMAL | Status: DC
  Administered 2022-07-28: 0.2 mg via TRANSDERMAL
  Filled 2022-07-28: qty 1

## 2022-07-28 MED ORDER — PANTOPRAZOLE SODIUM 40 MG IV SOLR
40.0000 mg | Freq: Two times a day (BID) | INTRAVENOUS | Status: AC
Start: 2022-07-28 — End: 2022-07-28
  Administered 2022-07-28 (×2): 40 mg via INTRAVENOUS
  Filled 2022-07-28 (×2): qty 10

## 2022-07-28 MED ORDER — METOCLOPRAMIDE HCL 5 MG/ML IJ SOLN
10.0000 mg | Freq: Once | INTRAMUSCULAR | Status: AC
  Administered 2022-07-28: 10 mg via INTRAVENOUS
  Filled 2022-07-28: qty 2

## 2022-07-28 MED ORDER — BISACODYL 10 MG RE SUPP
10.0000 mg | Freq: Every day | RECTAL | Status: DC
  Administered 2022-07-28 – 2022-07-29 (×2): 10 mg via RECTAL
  Filled 2022-07-28 (×2): qty 1

## 2022-07-28 MED ORDER — HYDRALAZINE HCL 20 MG/ML IJ SOLN
10.0000 mg | INTRAMUSCULAR | Status: DC | PRN
  Administered 2022-07-28: 10 mg via INTRAVENOUS
  Filled 2022-07-28: qty 1

## 2022-07-28 NOTE — Progress Notes (Signed)
Mobility Specialist Progress Note:   07/28/22 1230  Mobility  Activity Ambulated with assistance in hallway  Level of Assistance Contact guard assist, steadying assist  Assistive Device Front wheel walker  Distance Ambulated (ft) 450 ft  Activity Response Tolerated fair  $Mobility charge 1 Mobility   Pt in bed and agreeable. No complaints during ambulation. Upon returning to the room, pt presented with confusion. Had a decline in ability to perform basic actions such as holding utensils and opening her lunch. RN notified.   Adylynn Hertenstein Mobility Specialist-Acute Rehab Secure Chat only

## 2022-07-28 NOTE — Evaluation (Signed)
Occupational Therapy Evaluation Patient Details Name: Cindy Huerta MRN: [id number] DOB: September 05, 1946 Today's Date: 07/28/2022   History of Present Illness Pt is a 76 y.o. F who presents 07/26/2022 with N/V, HA, and chest pain. CT scans largely unremarkable except for possible PNA. Significant PMH: breast cancer, chronic diastolic CHF, HTN, HLD, anxiety/depression, stage 3B CKD, and hypothyroidism.   Clinical Impression   Pt reports independence at baseline with ADLs, uses RW/cane for mobility and lives with son who assists with some meals as well as med mgmt. Pt currently needing min guard -mod A for ADLs, min guard for bed mobility and transfers with use of RW. Pt feeling nauseas at end of session, RN notified. Pt presenting with impairments listed below, will follow acutely. Recommend HHOT at d/c.      Recommendations for follow up therapy are one component of a multi-disciplinary discharge planning process, led by the attending physician.  Recommendations may be updated based on patient status, additional functional criteria and insurance authorization.   Follow Up Recommendations  Home health OT    Assistance Recommended at Discharge Intermittent Supervision/Assistance  Patient can return home with the following A little help with walking and/or transfers;A little help with bathing/dressing/bathroom;Assistance with cooking/housework;Assist for transportation    Functional Status Assessment  Patient has had a recent decline in their functional status and demonstrates the ability to make significant improvements in function in a reasonable and predictable amount of time.  Equipment Recommendations  None recommended by OT (pt has all needed DME)    Recommendations for Other Services PT consult     Precautions / Restrictions Precautions Precautions: Fall Restrictions Weight Bearing Restrictions: No      Mobility Bed Mobility Overal bed mobility: Needs Assistance Bed Mobility:  Supine to Sit     Supine to sit: Min guard Sit to supine: Min guard        Transfers Overall transfer level: Needs assistance Equipment used: Rolling walker (2 wheels) Transfers: Sit to/from Stand Sit to Stand: Min guard                  Balance Overall balance assessment: Mild deficits observed, not formally tested                                         ADL either performed or assessed with clinical judgement   ADL Overall ADL's : Needs assistance/impaired Eating/Feeding: Modified independent   Grooming: Wash/dry hands;Standing;Min guard   Upper Body Bathing: Min guard   Lower Body Bathing: Moderate assistance   Upper Body Dressing : Min guard   Lower Body Dressing: Moderate assistance   Toilet Transfer: Min guard;Ambulation;Regular Toilet;Rolling walker (2 wheels)   Toileting- Clothing Manipulation and Hygiene: Supervision/safety       Functional mobility during ADLs: Min guard;Rolling walker (2 wheels)       Vision   Vision Assessment?: No apparent visual deficits     Perception     Praxis      Pertinent Vitals/Pain Pain Assessment Pain Assessment: No/denies pain     Hand Dominance Right   Extremity/Trunk Assessment Upper Extremity Assessment Upper Extremity Assessment: Generalized weakness (reporting some R hand pain with use of RW)   Lower Extremity Assessment Lower Extremity Assessment: Defer to PT evaluation   Cervical / Trunk Assessment Cervical / Trunk Assessment: Normal   Communication Communication Communication: Other (comment) (soft spoken)  Cognition Arousal/Alertness: Awake/alert Behavior During Therapy: WFL for tasks assessed/performed Overall Cognitive Status: Within Functional Limits for tasks assessed                                 General Comments: WFL for basic mobility     General Comments  BP 164/82 (105), pt nauseas after short ambulation distance in room/hall, RN  aware    Exercises     Shoulder Instructions      Home Living Family/patient expects to be discharged to:: Private residence Living Arrangements: Children (son) Available Help at Discharge: Family;Available 24 hours/day Type of Home: House Home Access: Stairs to enter [employer] of Steps: 2 Entrance Stairs-Rails: None Home Layout: Two level;Bed/bath upstairs Alternate Level Stairs-Number of Steps: 1/2 flight + landing + 1/2 flight Alternate Level Stairs-Rails: Left;Right;Can reach both Bathroom Shower/Tub: Walk-in shower         Home Equipment: Conservation officer, nature (2 wheels);Cane - quad;Shower seat   Additional Comments: son & daughter in law work from home      Prior Functioning/Environment Prior Level of Function : Independent/Modified Independent;History of Falls (last six months);Patient poor historian/Family not available;Driving             Mobility Comments: Pt reports she would drive to[facility]from the [facility] because it was close to her house, goes [facility] the [facility] 5x/[facility] (states she did not go recently due to heat outside). Pt has been using RW ADLs Comments: Pt states she is independent with bathing and dressing. Pt reports minimal cooking (mostly microwave). Son assists with med mgmt        OT Problem List: Decreased strength;Decreased range of motion;Decreased activity tolerance;Impaired balance (sitting and/or standing);Impaired UE functional use      OT Treatment/Interventions: Self-care/ADL training;Therapeutic exercise;Energy conservation;DME and/or AE instruction;Therapeutic activities;Balance training;Patient/family education    OT Goals(Current goals can be found in the care plan section) Acute Rehab OT Goals Patient Stated Goal: none stated OT Goal Formulation: With patient Time For Goal Achievement: 08/11/22 Potential to Achieve Goals: Good ADL Goals Pt Will Perform Upper Body Dressing: with supervision;sitting Pt Will Perform Lower Body Dressing:  with supervision;sitting/lateral leans;sit to/from stand Pt Will Transfer to Toilet: with supervision;ambulating;regular height toilet Pt Will Perform Tub/Shower Transfer: Tub transfer;Shower transfer;with supervision;shower seat;ambulating;rolling walker  OT Frequency: Min 2X/week    Co-evaluation              AM-PAC OT "6 Clicks" Daily Activity     Outcome Measure Help from another person eating meals?: None Help from another person taking care of personal grooming?: A Little Help from another person toileting, which includes using toliet, bedpan, or urinal?: A Little Help from another person bathing (including washing, rinsing, drying)?: A Lot Help from another person to put on and taking off regular upper body clothing?: A Little Help from another person to put on and taking off regular lower body clothing?: A Lot 6 Click Score: 17   End of Session Equipment Utilized During Treatment: Gait belt;Rolling walker (2 wheels) Nurse Communication: Mobility status (pt nauseaus, requesting meds)  Activity Tolerance: Patient tolerated treatment well Patient left: in bed;with call bell/phone within reach;with bed alarm set  OT Visit Diagnosis: Unsteadiness on feet (R26.81);Other abnormalities of gait and mobility (R26.89);Muscle weakness (generalized) (M62.81)                Time: 5366-4403 OT Time Calculation (min): 34 min Charges:  OT General Charges $  OT Visit: 1 Visit OT Evaluation $OT Eval Moderate Complexity: 1 Mod OT Treatments $Self Care/Home Management : 8-22 mins  Lynnda Child, OTD, OTR/L Acute Rehab [phone] [place] 07/28/2022, 9:02 AM

## 2022-07-28 NOTE — TOC Initial Note (Signed)
Transition of Care [facility]) - Initial/Assessment Note    Patient Details  Name: Cindy Huerta MRN: [id number] Date of Birth: 1945-12-27  Transition of [facility]) CM/SW Contact:    Tom-Johnson, Renea Ee, RN Phone Number: 07/28/2022, 3:45 PM  Clinical Narrative:                  CM spoke with patient at bedside about needs for post hospital transition. Admitted for Dehydration.  Patient states she lives at home with her son and his wife. Has two supportive children. Has a cane, walker and shower seat at home.  PCP is Juluis Pitch, MD [facility] [place]  Home health PT/OT recommended, patient states she had used [facility] services recently in July. CM sent referral to [facility] with acceptance noted. Info on AVS.  CM spoke with son, Cindy Huerta about discharge disposition. Cindy Huerta concerned about patient's frequent hospitalizations and her anxiety. CM notified Cindy Huerta that the MD will be the right person to discuss his concerns with. States he will try to speak with MD. Also requests lists of ALF in [place] and around [place]. List left at patient's bedside by CM. CM will continue to follow with needs as patient progresses with care.   Expected Discharge Plan: [place] Barriers to Discharge: Continued Medical Work up   Patient Goals and CMS Choice Patient states their goals for this hospitalization and ongoing recovery are:: To return home CMS Medicare.gov Compare Post Acute Care list provided to:: Patient Choice offered to / list presented to : Patient, Adult Children Cindy Huerta)  Expected Discharge Plan and Services Expected Discharge Plan: [place]   Discharge Planning Services: CM Consult Post Acute Care Choice: [place] arrangements for the past 2 months: Single Family Home                           HH Arranged: PT, OT [facility] Agency: [facility] Date [place]: 07/28/22 Time [place]:  2 Representative spoke with at [place]: [place] Arrangements/Services Living arrangements for the past 2 months: [place] Lives with:: Adult Children Patient language and need for interpreter reviewed:: Yes Do you feel safe going back to the place where you live?: Yes      Need for Family Participation in Patient Care: Yes (Comment) Care giver support system in place?: Yes (comment) Current home services: DME (Cane, walker,shower seat.) Criminal Activity/Legal Involvement Pertinent to Current Situation/Hospitalization: No - Comment as needed  Activities of Daily Living Home Assistive Devices/Equipment: Dentures (specify type), Scales, Walker (specify type) ADL Screening (condition at time of admission) Patient's cognitive ability adequate to safely complete daily activities?: No Is the patient deaf or have difficulty hearing?: No Does the patient have difficulty seeing, even when wearing glasses/contacts?: No Does the patient have difficulty concentrating, remembering, or making decisions?: Yes Patient able to express need for assistance with ADLs?: Yes Does the patient have difficulty dressing or bathing?: Yes Independently performs ADLs?: No Communication: Independent Dressing (OT): Needs assistance Is this a change from baseline?: Change from baseline, expected to last >3 days Grooming: Needs assistance Is this a change from baseline?: Change from baseline, expected to last >3 days Feeding: Independent Bathing: Needs assistance Is this a change from baseline?: Change from baseline, expected to last >3 days Toileting: Needs assistance Is this a change from baseline?: Change from baseline, expected to last >3days  In/Out Bed: Needs assistance Is this a change from baseline?: Change from baseline, expected to last >3 days Walks in Home: Needs assistance Is this a change from baseline?: Change from baseline, expected to last >3 days Does the patient have  difficulty walking or climbing stairs?: Yes Weakness of Legs: Both Weakness of Arms/Hands: None  Permission Sought/Granted Permission sought to share information with : Case Manager, Customer service manager, Family Supports Permission granted to share information with : Yes, Verbal Permission Granted              Emotional Assessment Appearance:: Appears stated age Attitude/Demeanor/Rapport: Engaged, Gracious Affect (typically observed): Accepting, Appropriate, Calm, Hopeful, Pleasant Orientation: : Oriented to Self, Oriented to Place, Oriented to  Time, Oriented to Situation Alcohol / Substance Use: Not Applicable Psych Involvement: No (comment)  Admission diagnosis:  Dehydration [E86.0] Hypokalemia [E87.6] Anxiety [F41.9] Bronchopneumonia [J18.0] Nausea & vomiting [R11.2] Hypertension, unspecified type [I10] Nausea and vomiting, unspecified vomiting type [R11.2] Patient Active Problem List   Diagnosis Date Noted   Dehydration 07/27/2022   Malnutrition of moderate degree 06/30/2022   Acute urinary retention 06/29/2022   AKI (acute kidney injury) ([place]) 06/28/2022   DNR (do not resuscitate) 06/28/2022   Hyponatremia 06/18/2022   Hypokalemia 06/18/2022   Chronic diastolic CHF (congestive heart failure) ([place]) 06/18/2022   Dysuria 06/18/2022   HTN (hypertension) 11/24/2021   Normocytic anemia 11/24/2021   Vertigo 11/24/2021   Breast CA ([place]) 11/22/2021   Breast cancer ([place]) 11/20/2021   Intractable nausea and vomiting 10/09/2021   Weakness 10/07/2021   Carcinoma of overlapping sites of left breast in female, estrogen receptor positive ([place]) 10/06/2021   Dizziness 10/06/2021   Rectal bleeding 10/06/2021   Nausea & vomiting 10/06/2021   GIB (gastrointestinal bleeding) 10/06/2021   Left-sided weakness 07/29/2021   HLD (hyperlipidemia) 07/29/2021   Hypothyroidism 07/29/2021   Left sided numbness 07/29/2021   Depression with anxiety    CKD (chronic kidney  disease), stage IIIa    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   Epigastric pain 04/23/2021   GERD (gastroesophageal reflux disease) 04/23/2021   Benign essential hypertension 01/20/2021   History of breast cancer 01/20/2021   History of TIA (transient ischemic attack) 01/20/2021   Palpitations 01/20/2021   Malignant neoplasm of upper-outer quadrant of left female breast ([place]) 12/06/2018   PCP:  Juluis Pitch, MD [id number] [place] [place] [address]Phone: [phone]: [phone] [facility] [address]Phone: [phone]: [phone]    Social Determinants of Health (SDOH) Interventions    Readmission Risk Interventions    06/20/2022    1:01 PM 11/25/2021    1:15 PM 10/10/2021    1:24 PM  Readmission Risk Prevention Plan  Transportation Screening  Complete Complete  PCP or Specialist Appt within 5-7 Days   Complete  PCP or Specialist Appt within 3-5 Days Complete Complete   Home Care Screening   Complete  Medication Review (RN CM)   Complete  HRI or Home Care Consult  Complete   Social Work Consult for [place]anning/Counseling Complete Complete   Palliative Care Screening Not Applicable Not Applicable   Medication Review (Press photographer  Complete

## 2022-07-28 NOTE — Plan of Care (Signed)
  Problem: Health Behavior/Discharge Planning: Goal: Ability to manage health-related needs will improve Outcome: Progressing   

## 2022-07-28 NOTE — Progress Notes (Signed)
New Admission Note:   Arrival Method: Via stretcher from ED Mental Orientation: Alert to person, place, & situation - She thought year was 2003 - Also, there is memory impairment Telemetry: Box 5M19 Assessment: Completed Skin:  Dry but intact IV: RAFA #22 Pain: Denies Tubes:  None Safety Measures: Safety Fall Prevention Plan has been given, discussed and signed Admission: Completed 5 MW Orientation: Patient has been orientated to the room, unit and staff.  Family: None at bedside - From home with son, [name]  Patient had upper and lower dentures, shirt, and shoes at the bedside.  Her skin is dry but intact.  She refused the purwick and would prefer to walk to the bathroom.  I explained the reason for the bed alarm and she expressed understanding.  Orders have been reviewed and implemented. Will continue to monitor the patient. Call light has been placed within reach and bed alarm has been activated.   Earleen Reaper RN Phone number: [phone]

## 2022-07-28 NOTE — Care Management Obs Status (Signed)
[place] NOTIFICATION   Patient Details  Name: Cindy Huerta MRN: [id number] Date of Birth: 1946-11-23   Medicare Observation Status Notification Given:  Yes    Tom-Johnson, Renea Ee, RN 07/28/2022, 9:13 AM

## 2022-07-28 NOTE — Progress Notes (Addendum)
Progress Note Patient: Cindy Huerta AYT:[id number] DOB: Mar 11, 1946 DOA: 07/26/2022  DOS: the patient was seen and examined on 07/28/2022  Brief hospital course:  Cindy Huerta is a 76 y.o. female with medical history significant of breast cancer, chronic diastolic CHF, HTN, HLD, anxiety/depression, stage 3B CKD, and hypothyroidism presenting with n/v. She was last admitted from 7/24-27 with AKI, hyponatremia, and acute urinary retention.  She has been back home from [facility] for about 2 weeks Present to the hospital with complaints of nausea and vomiting for a week with AKI and lactic acidosis.  Found to have severe constipation. Assessment and Plan: Intractable nausea and vomiting. Dehydration. Severe constipation. CT abdomen shows evidence of constipation with rectal wall. We will treat aggressively with bowel regimen. Patient did receive IV fluid although in the setting of hypertension I will be holding fluids. Patient received lactulose x2, Senokot 2 tablets twice daily, Dulcolax suppository x1. On 8/24 we will consider enema if this does not improve constipation. Repeat x-ray tomorrow.  Stage 3a CKD -Slightly worse than baseline on admission due to poor PO intake and recurrent n/v in the setting of worsening anxiety and possible dementia; improving -Recheck BMP in AM   Hyponatremia/hypokalemia -Recurrent, likely hypovolemic and chronic -IVF hydration -K+ repleted, continue Mag Ox -Recheck BMP in AM   Depression with anxiety/panic -This appears to be the biggest issue currently; son believes this was the reason for ER presentation -She is no longer taking fluoextine -Continue Ativan -Psychiatry consulted, recommend no change in therapy.  Consider Celexa if the patient has Bendix-term depression -Also with concerns for dementia with behavioral disturbance -Delirium precautions -prn Haldol also ordered -PT/OT/ST (cognitive/language)/TOC team consults again requested     Hypothyroidism -Continue Synthroid   HLD (hyperlipidemia) -Continue Lipitor   History of TIA (transient ischemic attack) -Continue Aspirin, Lipitor   HTN (hypertension) urgency -Continue home toprol XL, change clonidine to a patch and continue IV hydralazine as needed. -hold telmisartan and Maxzide due to hyponatremia and AKI, consider dc   Chronic diastolic CHF (congestive heart failure) ([place]) -Echo on 07/29/2021 showed EF of 55-60% with grade 2 diastolic dysfunction -Appears to be compensated   DNR -I have discussed code status with the patient's son and the patient would not desire resuscitation and would prefer to die a natural death should that situation arise. -She will need a gold out of facility DNR form at the time of discharge   Subjective: At the time of my evaluation was reporting nausea without any vomiting.  Also reported abdominal pain.  Had multiple BMs which were loose and liquidy.  No blood in the stool reported by RN.  No fever no chills.  No chest pain.  No cough.  Physical Exam: Vitals:   07/28/22 1100 07/28/22 1539 07/28/22 1656 07/28/22 2000  BP: (!) 156/70 (!) 144/70 (!) 185/79 (!) 192/88  Pulse: 61 62 61 63  Resp: '17 17 17 18  '$ Temp: 97.6 F (36.4 C) 97.6 F (36.4 C) 97.9 F (36.6 C) 98.6 F (37 C)  TempSrc:    Oral  SpO2: 100% 100% 100% 100%  Weight:      Height:       General: Appear in moderate distress; no visible Abnormal Neck Mass Or lumps, Conjunctiva normal Cardiovascular: S1 and S2 Present, no Murmur, Respiratory: good respiratory effort, Bilateral Air entry present and CTA, no Crackles, no wheezes Abdomen: Bowel Sound present, Non tender  Extremities: no Pedal edema Neurology: alert and oriented to place and  person severely anxious Gait not checked due to patient safety concerns   Data Reviewed: I have Reviewed nursing notes, Vitals, and Lab results since pt's last encounter. Pertinent lab results CBC and BMP I have ordered test  including CBC and BMP    Family Communication: No one at bedside  Disposition: Status is: Observation  Author: Berle Mull, MD 07/28/2022 8:59 PM  Please look on [email or web address] to find out who is on call.

## 2022-07-28 NOTE — Hospital Course (Signed)
Cindy Huerta is a 76 y.o. female with medical history significant of breast cancer, chronic diastolic CHF, HTN, HLD, anxiety/depression, stage 3B CKD, and hypothyroidism presenting with n/v. She was last admitted from 7/24-27 with AKI, hyponatremia, and acute urinary retention.  She has been back home from [facility] for about 2 weeks Present to the hospital with complaints of nausea and vomiting for a week with AKI and lactic acidosis.  Found to have severe constipation.

## 2022-07-29 ENCOUNTER — Observation Stay (HOSPITAL_COMMUNITY): Payer: Medicare PPO

## 2022-07-29 DIAGNOSIS — E860 Dehydration: Secondary | ICD-10-CM | POA: Diagnosis not present

## 2022-07-29 LAB — LACTIC ACID, PLASMA: Lactic Acid, Venous: 1 mmol/L (ref 0.5–1.9)

## 2022-07-29 LAB — CBC
HCT: 28.9 % — ABNORMAL LOW (ref 36.0–46.0)
Hemoglobin: 10 g/dL — ABNORMAL LOW (ref 12.0–15.0)
MCH: 30.9 pg (ref 26.0–34.0)
MCHC: 34.6 g/dL (ref 30.0–36.0)
MCV: 89.2 fL (ref 80.0–100.0)
Platelets: 212 10*3/uL (ref 150–400)
RBC: 3.24 MIL/uL — ABNORMAL LOW (ref 3.87–5.11)
RDW: 13.7 % (ref 11.5–15.5)
WBC: 11 10*3/uL — ABNORMAL HIGH (ref 4.0–10.5)
nRBC: 0 % (ref 0.0–0.2)

## 2022-07-29 LAB — MAGNESIUM: Magnesium: 1.4 mg/dL — ABNORMAL LOW (ref 1.7–2.4)

## 2022-07-29 LAB — BASIC METABOLIC PANEL
Anion gap: 9 (ref 5–15)
BUN: 15 mg/dL (ref 8–23)
CO2: 25 mmol/L (ref 22–32)
Calcium: 9.1 mg/dL (ref 8.9–10.3)
Chloride: 99 mmol/L (ref 98–111)
Creatinine, Ser: 1.25 mg/dL — ABNORMAL HIGH (ref 0.44–1.00)
GFR, Estimated: 44 mL/min — ABNORMAL LOW (ref >60)
Glucose, Bld: 94 mg/dL (ref 70–99)
Potassium: 3.8 mmol/L (ref 3.5–5.1)
Sodium: 133 mmol/L — ABNORMAL LOW (ref 135–145)

## 2022-07-29 MED ORDER — LACTATED RINGERS IV SOLN
INTRAVENOUS | Status: DC
Start: 2022-07-29 — End: 2022-07-30

## 2022-07-29 MED ORDER — ADULT MULTIVITAMIN W/MINERALS CH
1.0000 | ORAL_TABLET | Freq: Every day | ORAL | Status: DC
  Administered 2022-07-29 – 2022-07-30 (×2): 1 via ORAL
  Filled 2022-07-29 (×2): qty 1

## 2022-07-29 MED ORDER — MAGNESIUM SULFATE 2 GM/50ML IV SOLN
2.0000 g | Freq: Once | INTRAVENOUS | Status: AC
Start: 2022-07-29 — End: 2022-07-30
  Administered 2022-07-29: 2 g via INTRAVENOUS
  Filled 2022-07-29: qty 50

## 2022-07-29 MED ORDER — AMLODIPINE BESYLATE 5 MG PO TABS
5.0000 mg | ORAL_TABLET | Freq: Every day | ORAL | Status: DC
  Administered 2022-07-29 – 2022-07-30 (×2): 5 mg via ORAL
  Filled 2022-07-29 (×2): qty 1

## 2022-07-29 MED ORDER — LACTULOSE 10 GM/15ML PO SOLN
20.0000 g | Freq: Two times a day (BID) | ORAL | Status: DC
  Administered 2022-07-29 – 2022-07-30 (×3): 20 g via ORAL
  Filled 2022-07-29 (×3): qty 30

## 2022-07-29 NOTE — Progress Notes (Signed)
OT Cancellation Note  Patient Details Name: LEANOR VORIS MRN: [id number] DOB: [birth year]   Cancelled Treatment:    Reason Eval/Treat Not Completed: Patient declined, no reason specified- pt with headache, asking OT to check back later as she gets tylenol at 12pm. Will follow and see as able.  Jolaine Artist, OT Acute Rehabilitation Services Office [phone]   Delight Stare 07/29/2022, 10:35 AM

## 2022-07-29 NOTE — Progress Notes (Signed)
Physical Therapy Treatment Patient Details Name: Cindy Huerta MRN: [id number] DOB: 1946-01-08 Today's Date: 07/29/2022   History of Present Illness Pt is a 76 y.o. F who presents 07/26/2022 with N/V, HA, and chest pain. CT scans largely unremarkable except for possible PNA. Significant PMH: breast cancer, chronic diastolic CHF, HTN, HLD, anxiety/depression, stage 3B CKD, and hypothyroidism.    PT Comments    Patient slightly slow processing and delayed following commands. History of dementia. Began dry heaving while ambulating and therefore distance decreased. Dry heaves continued for ~5 minutes during seated rest and returned to supine at pt's request. RN in to administer meds for nausea.     Recommendations for follow up therapy are one component of a multi-disciplinary discharge planning process, led by the attending physician.  Recommendations may be updated based on patient status, additional functional criteria and insurance authorization.  Follow Up Recommendations  Home health PT     Assistance Recommended at Discharge PRN  Patient can return home with the following Assistance with cooking/housework;Assist for transportation;Help with stairs or ramp for entrance   Equipment Recommendations  None recommended by PT    Recommendations for Other Services       Precautions / Restrictions Precautions Precautions: Fall Restrictions Weight Bearing Restrictions: No     Mobility  Bed Mobility Overal bed mobility: Needs Assistance Bed Mobility: Supine to Sit, Sit to Supine     Supine to sit: Min guard Sit to supine: Min assist   General bed mobility comments: assist for LEs up onto bed    Transfers Overall transfer level: Needs assistance Equipment used: Rolling walker (2 wheels) Transfers: Sit to/from Stand Sit to Stand: Min guard           General transfer comment: vc for sequencing    Ambulation/Gait Ambulation/Gait assistance: Min guard Gait Distance  (Feet): 140 Feet Assistive device: Rolling walker (2 wheels) Gait Pattern/deviations: Step-through pattern, Decreased stride length, Shuffle Gait velocity: decr Gait velocity interpretation: 1.31 - 2.62 ft/sec, indicative of limited community ambulator   General Gait Details: minguard for safety as pt appears unsteady, especially when turning (pt performing very wide turns with RW); decr heelstrike bil with foot flat/shuffling--did not improve with cues   Stairs             Wheelchair Mobility    Modified Rankin (Stroke Patients Only)       Balance Overall balance assessment: Mild deficits observed, not formally tested                                          Cognition Arousal/Alertness: Awake/alert Behavior During Therapy: WFL for tasks assessed/performed Overall Cognitive Status: No family/caregiver present to determine baseline cognitive functioning                                 General Comments: WFL for basic mobility        Exercises      General Comments General comments (skin integrity, edema, etc.): began having dry heaves while walking and for ~5 minutes after returned to sitting      Pertinent Vitals/Pain Pain Assessment Pain Assessment: Faces Pain Score: 8  Pain Location: headache Pain Descriptors / Indicators: Discomfort Pain Intervention(s): Patient requesting pain meds-RN notified, Limited activity within patient's tolerance, Monitored during session    Home Living  Family/patient expects to be discharged to:: Private residence Living Arrangements: Children (son) Available Help at Discharge: Family;Available 24 hours/day Type of Home: House Home Access: Stairs to enter Entrance Stairs-Rails: None Entrance Stairs-Number of Steps: 2 Alternate Level Stairs-Number of Steps: 1/2 flight + landing + 1/2 flight Home Layout: Two level;Bed/bath upstairs Home Equipment: Conservation officer, nature (2 wheels);Cane - quad;Shower  seat Additional Comments: son & daughter in law work from home    Prior Function            PT Goals (current goals can now be found in the care plan section) Acute Rehab PT Goals Patient Stated Goal: agreeable to HHPT Time For Goal Achievement: 08/10/22 Potential to Achieve Goals: Good Progress towards PT goals: Progressing toward goals    Frequency    Min 3X/week      PT Plan Current plan remains appropriate    Co-evaluation              AM-PAC PT "6 Clicks" Mobility   Outcome Measure  Help needed turning from your back to your side while in a flat bed without using bedrails?: None Help needed moving from lying on your back to sitting on the side of a flat bed without using bedrails?: A Little Help needed moving to and from a bed to a chair (including a wheelchair)?: A Little Help needed standing up from a chair using your arms (e.g., wheelchair or bedside chair)?: A Little Help needed to walk in hospital room?: A Little Help needed climbing 3-5 steps with a railing? : A Little 6 Click Score: 19    End of Session Equipment Utilized During Treatment: Gait belt Activity Tolerance: Treatment limited secondary to medical complications (Comment) (limited by nausea, dry heaves) Patient left: in bed;with call bell/phone within reach Nurse Communication: Mobility status PT Visit Diagnosis: Unsteadiness on feet (R26.81);Difficulty in walking, not elsewhere classified (R26.2)     Time: 5284-1324 PT Time Calculation (min) (ACUTE ONLY): 20 min  Charges:                         Arby Barrette, PT Acute Rehabilitation Services  Office [phone]    Rexanne Mano 07/29/2022, 1:57 PM

## 2022-07-29 NOTE — Progress Notes (Signed)
Initial Nutrition Assessment  DOCUMENTATION CODES:   Not applicable  INTERVENTION:  Continue Ensure Enlive po TID, each supplement provides 350 kcal and 20 grams of protein. MVI with minerals daily  NUTRITION DIAGNOSIS:   Increased nutrient needs related to acute illness as evidenced by estimated needs.  GOAL:   Patient will meet greater than or equal to 90% of their needs  MONITOR:   Supplement acceptance, PO intake, Labs, Weight trends  REASON FOR ASSESSMENT:   Consult Other (Comment) (nutrition goals)  ASSESSMENT:   Patient admitted with severe constipation and n/v for a week leading to dehydration. PMH significant for breast cancer, chronic diastolic CHF, HTN, HLD, anxiety/depression, stage 3B CKD, and hypothyroidism.  Pt lying comfortably in bed. She did not provide much detailed nutrition related history. She did report having some mild nausea after lunch today as well as a new cough that she has been experiencing today. Observed 2 Ensure on table with a few sips taken of each.   Meal completions: 8/23: 100%-breakfast, 0%-lunch, 25%-dinner 8/24: 25% breakfast, 25% lunch, 25% dinner  Reviewed weight history. Her weight has gradually been trending down within the last year. Noted a 10.6% weight loss within the last year which is not clinically significant for time frame.   Patient does not currently meet criteria for malnutrition however she is at high nutritional risk and would benefit from most liberalized diet as well as continued use of nutrition supplements.   Medications: dolcolax, lactulose, synthroid, protonix, klor-con, senna  Labs: sodium 133, Cr 1.25, Mg 1.4 (repleted), GFR 44  NUTRITION - FOCUSED PHYSICAL EXAM:  Flowsheet Row Most Recent Value  Orbital Region No depletion  Upper Arm Region No depletion  Thoracic and Lumbar Region No depletion  Buccal Region No depletion  Temple Region Mild depletion  Clavicle Bone Region No depletion  Clavicle and  Acromion Bone Region No depletion  Scapular Bone Region No depletion  Dorsal Hand Moderate depletion  Patellar Region Moderate depletion  Anterior Thigh Region Moderate depletion  Posterior Calf Region Mild depletion  Edema (RD Assessment) None  Hair Reviewed  Eyes Reviewed  Mouth Reviewed  Skin Reviewed  Nails Reviewed       Diet Order:   Diet Order             DIET SOFT Room service appropriate? Yes; Fluid consistency: Thin  Diet effective now                   EDUCATION NEEDS:   No education needs have been identified at this time  Skin:  Skin Assessment: Reviewed RN Assessment  Last BM:  8/24 (type 4)  Height:   Ht Readings from Last 1 Encounters:  07/26/22 '5\' 6"'$  (1.676 m)    Weight:   Wt Readings from Last 1 Encounters:  07/27/22 68.9 kg    BMI:  Body mass index is 24.52 kg/m.  Estimated Nutritional Needs:   Kcal:  1600-1800  Protein:  80-95g  Fluid:  >/=1.6L  Clayborne Dana, RDN, LDN Clinical Nutrition

## 2022-07-29 NOTE — Evaluation (Signed)
Speech Language Pathology Evaluation Patient Details Name: Cindy Huerta MRN: [id number] DOB: 1946/11/01 Today's Date: 07/29/2022 Time: 6073-7106 SLP Time Calculation (min) (ACUTE ONLY): 12 min  Problem List:  Patient Active Problem List   Diagnosis Date Noted   Dehydration 07/27/2022   Malnutrition of moderate degree 06/30/2022   Acute urinary retention 06/29/2022   AKI (acute kidney injury) ([place]) 06/28/2022   DNR (do not resuscitate) 06/28/2022   Hyponatremia 06/18/2022   Hypokalemia 06/18/2022   Chronic diastolic CHF (congestive heart failure) ([place]) 06/18/2022   Dysuria 06/18/2022   HTN (hypertension) 11/24/2021   Normocytic anemia 11/24/2021   Vertigo 11/24/2021   Breast CA ([place]) 11/22/2021   Breast cancer ([place]) 11/20/2021   Intractable nausea and vomiting 10/09/2021   Weakness 10/07/2021   Carcinoma of overlapping sites of left breast in female, estrogen receptor positive ([place]) 10/06/2021   Dizziness 10/06/2021   Rectal bleeding 10/06/2021   Nausea & vomiting 10/06/2021   GIB (gastrointestinal bleeding) 10/06/2021   Left-sided weakness 07/29/2021   HLD (hyperlipidemia) 07/29/2021   Hypothyroidism 07/29/2021   Left sided numbness 07/29/2021   Depression with anxiety    CKD (chronic kidney disease), stage IIIa    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   Epigastric pain 04/23/2021   GERD (gastroesophageal reflux disease) 04/23/2021   Benign essential hypertension 01/20/2021   History of breast cancer 01/20/2021   History of TIA (transient ischemic attack) 01/20/2021   Palpitations 01/20/2021   Malignant neoplasm of upper-outer quadrant of left female breast ([place]) 12/06/2018   Past Medical History:  Past Medical History:  Diagnosis Date   Arthritis    Breast cancer ([place])    Depression    GERD (gastroesophageal reflux disease)    HLD (hyperlipidemia)    Hypertension    Hypothyroidism    Past Surgical History:  Past Surgical History:  Procedure Laterality Date    ABDOMINAL HYSTERECTOMY  1982   AXILLARY SENTINEL NODE BIOPSY Left 11/20/2021   Procedure: AXILLARY SENTINEL NODE BIOPSY;  Surgeon: Benjamine Sprague, DO;  Location: [facility] ORS;  Service: General;  Laterality: Left;   BREAST BIOPSY Left 09/23/2021   3:00 13cmfn venus marker, path pending   BREAST BIOPSY Left 09/23/2021   3:00 14 cmfn heart marker, path pending   CHOLECYSTECTOMY     ESOPHAGOGASTRODUODENOSCOPY (EGD) WITH PROPOFOL N/A 10/08/2021   Procedure: ESOPHAGOGASTRODUODENOSCOPY (EGD) WITH PROPOFOL;  Surgeon: Jonathon Bellows, MD;  Location: [facility] ENDOSCOPY;  Service: Gastroenterology;  Laterality: N/A;   HEMATOMA EVACUATION Left 11/21/2021   Procedure: EVACUATION HEMATOMA;  Surgeon: Benjamine Sprague, DO;  Location: [facility] ORS;  Service: General;  Laterality: Left;   left lumpectomy  2019   TOTAL MASTECTOMY Left 11/20/2021   Procedure: TOTAL MASTECTOMY;  Surgeon: Benjamine Sprague, DO;  Location: [facility] ORS;  Service: General;  Laterality: Left;   HPI:  Cindy Huerta is a 76 y.o. female with medical history significant of breast cancer, chronic diastolic CHF, HTN, HLD, anxiety/depression, stage 3B CKD, and hypothyroidism presenting with n/v, H/A, and chest pain.  Most recent SLE on 07/01/22.   Assessment / Plan / Recommendation Clinical Impression  Pt was seen for a cognitive-linguistic evaluation and she presents with deficits in the areas of short-term memory, sustained/focused attention, functional problem solving (particularly numeric), and executive functioning.  Receptive language appeared to be functional, but intermittent word finding difficulty was observed during confrontational naming tasks and in complex conversational speech.  Evaluation was limited secondary to pt's reduced willingness to participate.  [facility] SLUMS was not fully completed secondary  to this; therefore, informal evaluation measures were utilized in addition to portions of the SLUMS.  Pt's speech intelligibility was reduced secondary to  reduced vocal intensity.  Pt reported that she lives with her son and that he assists her with some IADLs at baseline including medication management.  Recommend home health ST at time of discharge targeting cognitive-linguistic deficits.    SLP Assessment  SLP Recommendation/Assessment: Patient needs continued Speech [place] Pathology Services SLP Visit Diagnosis: Cognitive communication deficit (R41.841)    Recommendations for follow up therapy are one component of a multi-disciplinary discharge planning process, led by the attending physician.  Recommendations may be updated based on patient status, additional functional criteria and insurance authorization.    Follow Up Recommendations  Home health SLP    Assistance Recommended at Discharge  Intermittent Supervision/Assistance  Functional Status Assessment Patient has had a recent decline in their functional status and demonstrates the ability to make significant improvements in function in a reasonable and predictable amount of time.  Frequency and Duration min 2x/week  2 weeks      SLP Evaluation Cognition  Overall Cognitive Status: No family/caregiver present to determine baseline cognitive functioning Arousal/Alertness: Awake/alert Orientation Level: Oriented to person;Oriented to place;Oriented to situation Attention: Focused;Sustained Focused Attention: Impaired Focused Attention Impairment: Verbal complex Sustained Attention: Impaired Sustained Attention Impairment: Verbal complex Memory: Impaired Memory Impairment: Decreased short term memory Decreased Short Term Memory: Verbal complex Awareness: Impaired Awareness Impairment: Emergent impairment Problem Solving: Impaired Problem Solving Impairment: Verbal complex;Functional complex Executive Function: Writer: Impaired Organizing Impairment: Verbal complex Safety/Judgment: Appears intact       Comprehension  Auditory Comprehension Overall  Auditory Comprehension: Appears within functional limits for tasks assessed    Expression Expression Primary Mode of Expression: Verbal Verbal Expression Overall Verbal Expression: Impaired Naming: Impairment Confrontation: Impaired Convergent: 75-100% accurate   Oral / Motor  Oral Motor/Sensory Function Overall Oral Motor/Sensory Function: Within functional limits           Bretta Bang, M.S., [place] Office: [phone]  [place] 07/29/2022, 10:50 AM

## 2022-07-29 NOTE — Progress Notes (Signed)
  Progress Note Patient: Cindy Huerta:[id number] DOB: 08-24-1946 DOA: 07/26/2022  DOS: the patient was seen and examined on 07/29/2022  Brief hospital course:  Cindy Huerta is a 76 y.o. female with medical history significant of breast cancer, chronic diastolic CHF, HTN, HLD, anxiety/depression, stage 3B CKD, and hypothyroidism presenting with n/v. She was last admitted from 7/24-27 with AKI, hyponatremia, and acute urinary retention.  She has been back home from [facility] for about 2 weeks Present to the hospital with complaints of nausea and vomiting for a week with AKI and lactic acidosis.  Found to have severe constipation.  Assessment and Plan: Intractable nausea and vomiting. Dehydration. Severe constipation. CT abdomen shows evidence of constipation with rectal wall. We will treat aggressively with bowel regimen. Patient did receive IV fluid although in the setting of hypertension I will be holding fluids. Patient received lactulose x2, Senokot 2 tablets twice daily, Dulcolax suppository x1. X-ray on 824 shows improvement in constipation.  Continue current regimen.  Stage 3a CKD Renal function worsened compared to yesterday. We will give IV fluids and monitor.   Hyponatremia/hypokalemia -Recurrent, likely hypovolemic and chronic -IVF hydration -K+ repleted, continue Mag Ox -Recheck BMP in AM   Depression with anxiety/panic -This appears to be the biggest issue currently; son believes this was the reason for ER presentation -She is no longer taking fluoextine -Continue Ativan -Psychiatry consulted, recommend no change in therapy.  Consider Celexa if the patient has Ganim-term depression    Hypothyroidism -Continue Synthroid   HLD (hyperlipidemia) -Continue Lipitor   History of TIA (transient ischemic attack) -Continue Aspirin, Lipitor   HTN (hypertension) urgency -Continue home toprol XL, change clonidine to a patch and continue IV hydralazine as needed.  Add  Norvasc. -hold telmisartan and Maxzide due to hyponatremia and AKI, consider dc   Chronic diastolic CHF (congestive heart failure) ([place]) -Echo on 07/29/2021 showed EF of 55-60% with grade 2 diastolic dysfunction -Appears to be compensated   DNR -I have discussed code status with the patient's son and the patient would not desire resuscitation and would prefer to die a natural death should that situation arise. -She will need a gold out of facility DNR form at the time of discharge   Subjective: Better but no nausea no vomiting no fever no chills.  Tolerating oral diet.  Physical Exam: Vitals:   07/28/22 2321 07/29/22 0614 07/29/22 0910 07/29/22 1708  BP: (!) 151/70 (!) 169/69 (!) 178/74 (!) 191/79  Pulse: 68 69 63 73  Resp:  '17 17 17  '$ Temp:  98.9 F (37.2 C) 98.4 F (36.9 C) 98.5 F (36.9 C)  TempSrc:  Oral    SpO2:  97% 99% 91%  Weight:      Height:       General: Appear in mild distress; no visible Abnormal Neck Mass Or lumps, Conjunctiva normal Cardiovascular: S1 and S2 Present, no Murmur, Respiratory: good respiratory effort, Bilateral Air entry present and CTA, no Crackles, no wheezes Abdomen: Bowel Sound present, Non tender Extremities: no Pedal edema Neurology: alert and oriented to Self, Place and time.  Data Reviewed: I have Reviewed nursing notes, Vitals, and Lab results since pt's last encounter. Pertinent lab results CBC and BMP I have ordered test including BMP     Family Communication: No one at bedside, discussed with son on the phone.  Disposition: Status is: Observation  Author: Berle Mull, MD 07/29/2022 8:25 PM  Please look on [email or web address] to find out who is on call.

## 2022-07-29 NOTE — Progress Notes (Signed)
Mobility Specialist Criteria Algorithm Info.   07/29/22 1000  Mobility  Activity Ambulated with assistance in hallway;Dangled on edge of bed  Range of Motion/Exercises Active;All extremities  Level of Assistance Contact guard assist, steadying assist  Assistive Device Front wheel walker  Distance Ambulated (ft) 120 ft  Activity Response Tolerated well   Patient with confusion this morning and complaining of headache. Was easily reoriented and agreeable to participate in mobility. Was independent with bed mobility but needed HHA from sidelying to sitting. Had severe tremors among dangling EOB that subsided after a few minutes. Required min A sit>stand + cues for hand placement. Was unsteady initially while ambulating which required min A. Eventually downgraded to min guard as steadiness and distance increased. Returned to room without complaint or incident. Was left dangling EOB with all needs met and RN present.   [place] Charina Fons, [place], [place]  WTGRM:[phone] Office: [phone]

## 2022-07-30 ENCOUNTER — Other Ambulatory Visit (HOSPITAL_COMMUNITY): Payer: Self-pay

## 2022-07-30 DIAGNOSIS — E860 Dehydration: Secondary | ICD-10-CM | POA: Diagnosis not present

## 2022-07-30 LAB — CBC
HCT: 30.7 % — ABNORMAL LOW (ref 36.0–46.0)
Hemoglobin: 10.4 g/dL — ABNORMAL LOW (ref 12.0–15.0)
MCH: 30.7 pg (ref 26.0–34.0)
MCHC: 33.9 g/dL (ref 30.0–36.0)
MCV: 90.6 fL (ref 80.0–100.0)
Platelets: 239 10*3/uL (ref 150–400)
RBC: 3.39 MIL/uL — ABNORMAL LOW (ref 3.87–5.11)
RDW: 13.6 % (ref 11.5–15.5)
WBC: 10.2 10*3/uL (ref 4.0–10.5)
nRBC: 0 % (ref 0.0–0.2)

## 2022-07-30 LAB — BASIC METABOLIC PANEL
Anion gap: 6 (ref 5–15)
BUN: 10 mg/dL (ref 8–23)
CO2: 26 mmol/L (ref 22–32)
Calcium: 9.2 mg/dL (ref 8.9–10.3)
Chloride: 103 mmol/L (ref 98–111)
Creatinine, Ser: 0.98 mg/dL (ref 0.44–1.00)
GFR, Estimated: 59 mL/min — ABNORMAL LOW (ref >60)
Glucose, Bld: 105 mg/dL — ABNORMAL HIGH (ref 70–99)
Potassium: 3.4 mmol/L — ABNORMAL LOW (ref 3.5–5.1)
Sodium: 135 mmol/L (ref 135–145)

## 2022-07-30 LAB — MAGNESIUM: Magnesium: 1.5 mg/dL — ABNORMAL LOW (ref 1.7–2.4)

## 2022-07-30 MED ORDER — ONDANSETRON 4 MG PO TBDP
4.0000 mg | ORAL_TABLET | Freq: Three times a day (TID) | ORAL | 0 refills | Status: DC | PRN
  Filled 2022-07-30: qty 20, 7d supply, fill #0

## 2022-07-30 MED ORDER — BISACODYL 10 MG RE SUPP
10.0000 mg | Freq: Every day | RECTAL | 0 refills | Status: DC
  Filled 2022-07-30: qty 12, 12d supply, fill #0

## 2022-07-30 MED ORDER — MAGNESIUM OXIDE 400 MG PO TABS
400.0000 mg | ORAL_TABLET | Freq: Every day | ORAL | 0 refills | Status: DC
  Filled 2022-07-30: qty 30, 30d supply, fill #0

## 2022-07-30 MED ORDER — MAGNESIUM SULFATE 2 GM/50ML IV SOLN
2.0000 g | Freq: Once | INTRAVENOUS | Status: AC
Start: 2022-07-30 — End: 2022-07-30
  Administered 2022-07-30: 2 g via INTRAVENOUS
  Filled 2022-07-30: qty 50

## 2022-07-30 MED ORDER — TELMISARTAN 80 MG PO TABS
80.0000 mg | ORAL_TABLET | Freq: Every day | ORAL | 0 refills | Status: DC
  Filled 2022-07-30: qty 30, 30d supply, fill #0

## 2022-07-30 MED ORDER — AMLODIPINE BESYLATE 5 MG PO TABS
5.0000 mg | ORAL_TABLET | Freq: Every day | ORAL | 0 refills | Status: DC
  Filled 2022-07-30: qty 30, 30d supply, fill #0

## 2022-07-30 MED ORDER — MAGNESIUM OXIDE -MG SUPPLEMENT 400 (240 MG) MG PO TABS: 400.0000 mg | ORAL_TABLET | Freq: Two times a day (BID) | ORAL | Status: DC

## 2022-07-30 MED ORDER — POLYETHYLENE GLYCOL 3350 17 GM/SCOOP PO POWD
17.0000 g | Freq: Every day | ORAL | 0 refills | Status: DC
  Filled 2022-07-30: qty 238, 14d supply, fill #0

## 2022-07-30 NOTE — Progress Notes (Signed)
DISCHARGE NOTE HOME Keighley Deckman Gadison to be discharged Home per MD order. Discussed prescriptions and follow up appointments with the patient. Prescriptions given to patient; medication list explained in detail. Patient verbalized understanding.  Skin clean, dry and intact without evidence of skin break down, no evidence of skin tears noted. IV catheter discontinued intact. Site without signs and symptoms of complications. Dressing and pressure applied. Pt denies pain at the site currently. No complaints noted.  Patient free of lines, drains, and wounds.   An After Visit Summary (AVS) was printed and given to the patient. Patient escorted via wheelchair, and discharged home via private auto.  Vira Agar, RN

## 2022-07-30 NOTE — Progress Notes (Signed)
Occupational Therapy Treatment Patient Details Name: Cindy Huerta MRN: [id number] DOB: October 22, 1946 Today's Date: 07/30/2022   History of present illness Pt is a 76 y.o. F who presents 07/26/2022 with N/V, HA, and chest pain. CT scans largely unremarkable except for possible PNA. Significant PMH: breast cancer, chronic diastolic CHF, HTN, HLD, anxiety/depression, stage 3B CKD, and hypothyroidism.   OT comments  Pt making steady progress towards OT goals this session. Pt continues to present with baseline cognitive deficits, decreased activity tolerance and generalized deconditioning. Session focus on BADL reeducation, functional mobility and increasing overall activity tolerance. Pt currently requires min guard assist for functional ambulation equivalent to a household distance and supervision for ADLs. Pt would continue to benefit from skilled occupational therapy while admitted and after d/c to address the below listed limitations in order to improve overall functional mobility and facilitate independence with BADL participation. DC plan remains appropriate, will follow acutely per POC.      Recommendations for follow up therapy are one component of a multi-disciplinary discharge planning process, led by the attending physician.  Recommendations may be updated based on patient status, additional functional criteria and insurance authorization.    Follow Up Recommendations  Home health OT    Assistance Recommended at Discharge Intermittent Supervision/Assistance  Patient can return home with the following  A little help with walking and/or transfers;A little help with bathing/dressing/bathroom;Assistance with cooking/housework;Assist for transportation   Equipment Recommendations  Other (comment) (pt has all needed DME)    Recommendations for Other Services      Precautions / Restrictions Precautions Precautions: Fall Restrictions Weight Bearing Restrictions: No       Mobility Bed  Mobility   Bed Mobility: Supine to Sit, Sit to Supine     Supine to sit: Supervision, HOB elevated Sit to supine: Supervision, HOB elevated   General bed mobility comments: supervision for safety    Transfers Overall transfer level: Needs assistance Equipment used: Rolling walker (2 wheels) Transfers: Sit to/from Stand Sit to Stand: Min guard           General transfer comment: min guard for initial steadying assist     Balance Overall balance assessment: Mild deficits observed, not formally tested                                         ADL either performed or assessed with clinical judgement   ADL Overall ADL's : Needs assistance/impaired             Lower Body Bathing: Supervison/ safety;Sitting/lateral leans Lower Body Bathing Details (indicate cue type and reason): simulated via LB dressing     Lower Body Dressing: Supervision/safety;Sitting/lateral leans Lower Body Dressing Details (indicate cue type and reason): to adjust socks froom EOB Toilet Transfer: Min guard;Ambulation;Rolling walker (2 wheels) Toilet Transfer Details (indicate cue type and reason): min guard for safety with Rw         Functional mobility during ADLs: Min guard;Rolling walker (2 wheels) General ADL Comments: ADL participation limited by impaired activity tolerance and baseline cognitive deficits    Extremity/Trunk Assessment Upper Extremity Assessment Upper Extremity Assessment: Generalized weakness   Lower Extremity Assessment Lower Extremity Assessment: Defer to PT evaluation   Cervical / Trunk Assessment Cervical / Trunk Assessment: Normal    Vision   Vision Assessment?: No apparent visual deficits   Perception Perception Perception: Within Functional Limits   Praxis  Praxis Praxis: Intact    Cognition Arousal/Alertness: Awake/alert Behavior During Therapy: WFL for tasks assessed/performed Overall Cognitive Status: History of cognitive  impairments - at baseline                                 General Comments: MAX cues to locate correct room        Exercises      Shoulder Instructions       General Comments      Pertinent Vitals/ Pain       Pain Assessment Pain Assessment: Faces Faces Pain Scale: Hurts a little bit Pain Location: headache Pain Descriptors / Indicators: Discomfort, Headache Pain Intervention(s): Monitored during session  Home Living                                          Prior Functioning/Environment              Frequency  Min 2X/week        Progress Toward Goals  OT Goals(current goals can now be found in the care plan section)  Progress towards OT goals: Progressing toward goals  Acute Rehab OT Goals Patient Stated Goal: to go home OT Goal Formulation: With patient Time For Goal Achievement: 08/11/22 Potential to Achieve Goals: Good  Plan Discharge plan remains appropriate;Frequency remains appropriate    Co-evaluation                 AM-PAC OT "6 Clicks" Daily Activity     Outcome Measure   Help from another person eating meals?: None Help from another person taking care of personal grooming?: A Little Help from another person toileting, which includes using toliet, bedpan, or urinal?: A Little Help from another person bathing (including washing, rinsing, drying)?: A Little Help from another person to put on and taking off regular upper body clothing?: None Help from another person to put on and taking off regular lower body clothing?: A Little 6 Click Score: 20    End of Session Equipment Utilized During Treatment: Gait belt;Rolling walker (2 wheels)  OT Visit Diagnosis: Unsteadiness on feet (R26.81);Other abnormalities of gait and mobility (R26.89);Muscle weakness (generalized) (M62.81)   Activity Tolerance Patient tolerated treatment well   Patient Left in bed;with call bell/phone within reach;with bed alarm  set;with family/visitor present   Nurse Communication Mobility status        Time: 7371-0626 OT Time Calculation (min): 11 min  Charges: OT General Charges $OT Visit: 1 Visit OT Treatments $Self Care/Home Management : 8-22 mins  Harley Alto., COTA/L Acute Rehabilitation Services [phone]   Precious Haws 07/30/2022, 1:29 PM

## 2022-07-30 NOTE — Plan of Care (Signed)

## 2022-07-30 NOTE — TOC Transition Note (Signed)
Transition of Care [facility]) - CM/SW Discharge Note   Patient Details  Name: Cindy Huerta MRN: [id number] Date of Birth: Dec 28, 1945  [facility]) CM/SW Contact:  Tom-Johnson, Renea Ee, RN Phone Number: 07/30/2022, 11:20 AM   Clinical Narrative:     Patient is scheduled for discharge today. Home health info on AVS. Son to transport at discharge. No further TOC needs noted.   Final next level of care: [place] Barriers to Discharge: Barriers Resolved   Patient Goals and CMS Choice Patient states their goals for this hospitalization and ongoing recovery are:: To return home CMS Medicare.gov Compare Post Acute Care list provided to:: Patient Choice offered to / list presented to : Patient, Adult Children [name])  Discharge Placement                Patient to be transferred to facility by: Son      Discharge Plan and Services   Discharge Planning Services: CM Consult Post Acute Care Choice: Home Health                    HH Arranged: PT, OT [facility] Agency: [facility] Date [facility] Agency Contacted: 07/28/22 Time [place]: [id number] Representative spoke with at [place]: [place] Determinants of Health ([place]) Interventions     Readmission Risk Interventions    06/20/2022    1:01 PM 11/25/2021    1:15 PM 10/10/2021    1:24 PM  Readmission Risk Prevention Plan  Transportation Screening  Complete Complete  PCP or Specialist Appt within 5-7 Days   Complete  PCP or Specialist Appt within 3-5 Days Complete Complete   Home Care Screening   Complete  Medication Review (RN CM)   Complete  HRI or Home Care Consult  Complete   Social Work Consult for [place] Planning/Counseling Complete Complete   Palliative Care Screening Not Applicable Not Applicable   Medication Review Press photographer)  Complete

## 2022-07-31 NOTE — Discharge Summary (Signed)
Physician Discharge Summary   Patient: Cindy Huerta MRN: [id number] DOB: January 15, 1946  Admit date:     07/26/2022  Discharge date: 07/30/2022  Discharge Physician: Berle Mull  PCP: Juluis Pitch, MD  Recommendations at discharge: Follow-up with PCP.   [place] Follow up.   Specialty: Home Health Services Why: Someone will call you for resumption of care. Contact information: [place] [address] [phone]         Juluis Pitch, MD. Schedule an appointment as soon as possible for a visit in 1 week(s).   Specialty: Family Medicine Contact information: [address] [place] [address] [phone]                Discharge Diagnoses: Principal Problem:   Dehydration Active Problems:   Hyponatremia   Hypokalemia   HLD (hyperlipidemia)   Hypothyroidism   Depression with anxiety   CKD (chronic kidney disease), stage IIIa   Benign essential hypertension   History of TIA (transient ischemic attack)   Nausea & vomiting   HTN (hypertension)   Chronic diastolic CHF (congestive heart failure) ([place])   DNR (do not resuscitate)  Hospital Course:  Cindy Huerta is a 76 y.o. female with medical history significant of breast cancer, chronic diastolic CHF, HTN, HLD, anxiety/depression, stage 3B CKD, and hypothyroidism presenting with n/v. She was last admitted from 7/24-27 with AKI, hyponatremia, and acute urinary retention.  She has been back home from [facility] for about 2 weeks Present to the hospital with complaints of nausea and vomiting for a week with AKI and lactic acidosis.  Found to have severe constipation. Assessment and Plan  Intractable nausea and vomiting. Dehydration. Severe constipation. CT abdomen shows evidence of constipation with rectal wall. We will treat aggressively with bowel regimen. Patient did receive IV fluid although in the setting of  hypertension I will be holding fluids. Patient received lactulose x2, Senokot 2 tablets twice daily, Dulcolax suppository x1. X-ray on 8/24 shows improvement in constipation.  Continue current regimen.   Stage 3a CKD Renal function now better.   Hyponatremia/hypokalemia Recurrent, likely hypovolemic and chronic Replaced    Depression with anxiety/panic This appears to be the biggest issue currently; son believes this was the reason for ER presentation She is no longer taking fluoextine Continue Ativan Psychiatry consulted, recommend no change in therapy.  Consider Celexa if the patient has [place]-term depression   Hypothyroidism Continue Synthroid   HLD (hyperlipidemia) Continue Lipitor   History of TIA (transient ischemic attack) Continue Aspirin, Lipitor   HTN (hypertension) urgency Continue home toprol XL, clonidine. Add Norvasc. -hold telmisartan and Maxzide due to hyponatremia and AKI   Chronic diastolic CHF (congestive heart failure) ([place]) -Echo on 07/29/2021 showed EF of 55-60% with grade 2 diastolic dysfunction -Appears to be compensated   DNR -I have discussed code status with the patient's son and the patient would not desire resuscitation and would prefer to die a natural death should that situation arise.  Consultants:  none  Procedures performed:  none  DISCHARGE MEDICATION: Allergies as of 07/30/2022       Reactions   Latex Rash   Novocain [procaine] Other (See Comments)   Unsure - told by DDS not to let anyone give it to her  Confusion   Zestril [lisinopril] Cough   Benadryl [diphenhydramine] Other (See Comments)   Jitteriness Agitation   Biaxin [clarithromycin] Other (See Comments)  Confusion   Roxicodone [oxycodone] Nausea And Vomiting, Anxiety        Medication List     STOP taking these medications    hydrALAZINE 25 MG tablet Commonly known as: APRESOLINE   triamterene-hydrochlorothiazide 37.5-25 MG tablet Commonly known as:  MAXZIDE-25       TAKE these medications    acetaminophen 500 MG tablet Commonly known as: TYLENOL Take 500-1,000 mg by mouth every 6 (six) hours as needed for moderate pain.   alum & mag hydroxide-simeth 200-200-20 MG/5ML suspension Commonly known as: MAALOX/MYLANTA Take 30 mLs by mouth every 6 (six) hours as needed for indigestion or heartburn.   amLODipine 5 MG tablet Commonly known as: NORVASC Take 1 tablet (5 mg total) by mouth daily.   Artificial Tears 0.1-0.3 % Soln Generic drug: Dextran 70-Hypromellose Place 1 drop into both eyes daily as needed for dry eyes.   aspirin EC 81 MG tablet Take 1 tablet (81 mg total) by mouth daily. RESTART 48HRS AFTER DISCHARGE What changed: Another medication with the same name was removed. Continue taking this medication, and follow the directions you see here.   atorvastatin 80 MG tablet Commonly known as: LIPITOR Take 80 mg by mouth daily.   Biofreeze 4 % Gel Generic drug: Menthol (Topical Analgesic) Apply 1 application  topically 2 (two) times daily.   bisacodyl 10 MG suppository Commonly known as: DULCOLAX Place 1 suppository (10 mg total) rectally daily at 12 noon.   cloNIDine 0.1 MG tablet Commonly known as: CATAPRES Take 0.1 mg by mouth 2 (two) times daily.   docusate sodium 100 MG capsule Commonly known as: COLACE Take 1 capsule (100 mg total) by mouth 2 (two) times daily.   Ensure Nutrition Shake Liqd Take 1 Bottle by mouth 2 (two) times daily as needed (loss of appetite). What changed: Another medication with the same name was removed. Continue taking this medication, and follow the directions you see here.   FLUoxetine 20 MG capsule Commonly known as: PROZAC Take 20 mg by mouth daily.   fluticasone 50 MCG/ACT nasal spray Commonly known as: FLONASE Place 1 spray into both nostrils daily as needed for allergies or rhinitis.   IRON PO Take 1 tablet by mouth daily.   levothyroxine 75 MCG tablet Commonly known  as: SYNTHROID Take 75 mcg by mouth daily before breakfast.   LORazepam 0.5 MG tablet Commonly known as: ATIVAN Take 1 tablet (0.5 mg total) by mouth daily.   magnesium oxide 400 MG tablet Commonly known as: MAG-OX Take 1 tablet (400 mg total) by mouth daily.   meclizine 12.5 MG tablet Commonly known as: ANTIVERT Take 12.5 mg by mouth 3 (three) times daily as needed for dizziness.   metoprolol succinate 50 MG 24 hr tablet Commonly known as: TOPROL-XL Take 50 mg by mouth 2 (two) times daily.   ondansetron 4 MG disintegrating tablet Commonly known as: ZOFRAN-ODT Take 1 tablet (4 mg total) by mouth every 8 (eight) hours as needed for nausea or vomiting.   pantoprazole 40 MG tablet Commonly known as: PROTONIX Take 1 tablet (40 mg total) by mouth daily.   polyethylene glycol powder 17 GM/SCOOP powder Commonly known as: GLYCOLAX/MIRALAX Take 1 capful (17 g) with water by mouth daily.   potassium chloride SA 20 MEQ tablet Commonly known as: KLOR-CON M Take 20 mEq by mouth daily.   telmisartan 80 MG tablet Commonly known as: MICARDIS Take 1 tablet (80 mg total) by mouth daily.   Thera-M Tabs Take 1  tablet by mouth daily.   traMADol 50 MG tablet Commonly known as: Ultram Take 1 tablet (50 mg total) by mouth every 6 (six) hours as needed for up to 10 doses for moderate pain.   trolamine salicylate 10 % cream Commonly known as: ASPERCREME Apply 1 application topically as needed for muscle pain.   vitamin B-12 100 MCG tablet Commonly known as: CYANOCOBALAMIN Take 100 mg by mouth 2 (two) times daily.   VITAMIN D-3 PO Take 1 capsule by mouth daily.       Disposition: Home Diet recommendation: Regular diet  Discharge Exam: Vitals:   07/29/22 1708 07/29/22 2239 07/30/22 0503 07/30/22 1010  BP: (!) 191/79 (!) 167/77 (!) 166/98 (!) 189/81  Pulse: 73 89 75 77  Resp: '17 17 16 17  '$ Temp: 98.5 F (36.9 C) 98.8 F (37.1 C) 98 F (36.7 C) 97.8 F (36.6 C)  TempSrc:    Oral Oral  SpO2: 91% 95% 100% 97%  Weight:      Height:       General: Appear in no distress; no visible Abnormal Neck Mass Or lumps, Conjunctiva normal Cardiovascular: S1 and S2 Present, no Murmur, Respiratory: good respiratory effort, Bilateral Air entry present and CTA, no Crackles, no wheezes Abdomen: Bowel Sound present, Non tender  Extremities: no Pedal edema Neurology: alert and oriented to time, place, and person  Filed Weights   07/26/22 1709 07/27/22 2235  Weight: 70.8 kg 68.9 kg   Condition at discharge: stable  The results of significant diagnostics from this hospitalization (including imaging, microbiology, ancillary and laboratory) are listed below for reference.   Imaging Studies: DG Abd Portable 1V  Result Date: 07/29/2022 CLINICAL DATA:  Constipation EXAM: PORTABLE ABDOMEN - 1 VIEW COMPARISON:  10/09/2021 FINDINGS: Bowel gas pattern is nonspecific. Small amount of stool is seen in colon. There is no fecal impaction in rectum. Surgical clips are seen in right upper quadrant. No abnormal masses or calcifications are seen. Kidneys are partly obscured by bowel contents. Degenerative changes are noted in the lumbar spine. IMPRESSION: Nonspecific bowel gas pattern. Electronically Signed   By: Elmer Picker M.D.   On: 07/29/2022 09:15   CT Angio Chest/Abd/Pel for Dissection W and/or W/WO  Result Date: 07/27/2022 CLINICAL DATA:  76 year old female with history of chest and back pain. Suspected aortic dissection. EXAM: CT ANGIOGRAPHY CHEST, ABDOMEN AND PELVIS TECHNIQUE: Non-contrast CT of the chest was initially obtained. Multidetector CT imaging through the chest, abdomen and pelvis was performed using the standard protocol during bolus administration of intravenous contrast. Multiplanar reconstructed images and MIPs were obtained and reviewed to evaluate the vascular anatomy. RADIATION DOSE REDUCTION: This exam was performed according to the departmental dose-optimization  program which includes automated exposure control, adjustment of the mA and/or kV according to patient size and/or use of iterative reconstruction technique. CONTRAST:  57m OMNIPAQUE IOHEXOL 350 MG/ML SOLN COMPARISON:  CT the abdomen and pelvis 06/18/2022. Chest CTA 12/01/2021. FINDINGS: CTA CHEST FINDINGS Cardiovascular: Precontrast images demonstrate no crescentic high attenuation associated with the wall of the thoracic aorta to suggest acute intramural hemorrhage. Postcontrast images demonstrate no evidence of thoracic aortic aneurysm or dissection. Thoracic aorta is normal in caliber measuring 3.1 cm, 2.6 cm and 2.4 cm in diameter in the ascending, mid arch and descending thoracic aorta respectively. There is aortic atherosclerosis, as well as atherosclerosis of the great vessels of the mediastinum and the coronary arteries, including calcified atherosclerotic plaque in the left anterior descending coronary artery. Heart size is  normal. There is no significant pericardial fluid, thickening or pericardial calcification. Mediastinum/Nodes: No pathologically enlarged mediastinal or hilar lymph nodes. Small hiatal hernia. Esophagus is otherwise unremarkable in appearance. No axillary lymphadenopathy. Lungs/Pleura: New area of ground-glass attenuation in the central left upper lobe best appreciated on axial image 70 of series 9, not evident on prior exam from 12/01/2021, presumably of infectious or inflammatory etiology. Right lung is clear. No pleural effusions. No definite suspicious appearing pulmonary nodules or masses are noted. Musculoskeletal: Status post left modified radical mastectomy. There are no aggressive appearing lytic or blastic lesions noted in the visualized portions of the skeleton. Review of the MIP images confirms the above findings. CTA ABDOMEN AND PELVIS FINDINGS VASCULAR Aorta: Normal caliber aorta without aneurysm, dissection, vasculitis or significant stenosis. Celiac: Patent without  evidence of aneurysm, dissection, vasculitis or significant stenosis. SMA: Patent without evidence of aneurysm, dissection, vasculitis or significant stenosis. Renals: Both renal arteries are patent without evidence of aneurysm, dissection, vasculitis, fibromuscular dysplasia or significant stenosis. IMA: Patent without evidence of aneurysm, dissection, vasculitis or significant stenosis. Inflow: Patent without evidence of aneurysm, dissection, vasculitis or significant stenosis. Veins: None. Review of the MIP images confirms the above findings. NON-VASCULAR Hepatobiliary: No suspicious cystic or solid hepatic lesions. No intra or extrahepatic biliary ductal dilatation. Status post cholecystectomy. Pancreas: No pancreatic mass. No pancreatic ductal dilatation. No pancreatic or peripancreatic fluid collections or inflammatory changes. Spleen: Unremarkable. Adrenals/Urinary Tract: Bilateral kidneys and adrenal glands are normal in appearance. No hydroureteronephrosis. Urinary bladder is mildly distended, but otherwise unremarkable in appearance. Stomach/Bowel: Intra-abdominal portion of the stomach is unremarkable. No pathologic dilatation of small bowel or colon. Numerous colonic diverticulae are noted, particularly in the descending colon, without surrounding inflammatory changes to indicate an acute diverticulitis at this time. The appendix is not confidently identified and may be surgically absent. Regardless, there are no inflammatory changes noted adjacent to the cecum to suggest the presence of an acute appendicitis at this time. Lymphatic: No lymphadenopathy noted in the abdomen or pelvis. Reproductive: Status post hysterectomy. Ovaries are not confidently identified may be surgically absent or atrophic. Other: No significant volume of ascites.  No pneumoperitoneum. Musculoskeletal: There are no aggressive appearing lytic or blastic lesions noted in the visualized portions of the skeleton. Review of the MIP  images confirms the above findings. IMPRESSION: 1. No acute abnormality of the thoracoabdominal aorta. Specifically, no evidence of acute aortic syndrome. 2. Focus of ground-glass attenuation in the central aspect of the left upper lobe, new compared to the prior examination, presumably of infectious or inflammatory etiology. Clinical correlation for signs and symptoms of developing bronchopneumonia is recommended. 3. Severe colonic diverticulosis without evidence of acute diverticulitis at this time. 4. Aortic atherosclerosis, in addition to left anterior descending coronary artery disease. Assessment for potential risk factor modification, dietary therapy or pharmacologic therapy may be warranted, if clinically indicated. 5. Additional incidental findings, as above. Electronically Signed   By: Vinnie Langton M.D.   On: 07/27/2022 05:26   DG Chest Portable 1 View  Result Date: 07/27/2022 CLINICAL DATA:  Chest pain. EXAM: PORTABLE CHEST 1 VIEW COMPARISON:  Chest CT dated 12/01/2021. FINDINGS: No focal consolidation, pleural effusion, pneumothorax. Mild cardiomegaly. No acute osseous pathology. IMPRESSION: 1. No acute cardiopulmonary process. 2. Mild cardiomegaly. Electronically Signed   By: Anner Crete M.D.   On: 07/27/2022 01:30   CT Head Wo Contrast  Result Date: 07/26/2022 CLINICAL DATA:  Headache, new or worsening (Age >= 50y) EXAM: CT HEAD WITHOUT  CONTRAST TECHNIQUE: Contiguous axial images were obtained from the base of the skull through the vertex without intravenous contrast. RADIATION DOSE REDUCTION: This exam was performed according to the departmental dose-optimization program which includes automated exposure control, adjustment of the mA and/or kV according to patient size and/or use of iterative reconstruction technique. COMPARISON:  06/28/2022 FINDINGS: Brain: No evidence of acute infarction, hemorrhage, hydrocephalus, extra-axial collection or mass lesion/mass effect. Small remote  right basal ganglia lacunar infarct. Patchy low-density changes within the periventricular and subcortical white matter compatible with chronic microvascular ischemic change. Mild diffuse cerebral volume loss. Vascular: Atherosclerotic calcifications involving the large vessels of the skull base. No unexpected hyperdense vessel. Skull: Normal. Negative for fracture or focal lesion. Sinuses/Orbits: No acute finding. Other: None. IMPRESSION: 1. No acute intracranial abnormality. 2. Chronic microvascular ischemic change and cerebral volume loss. Electronically Signed   By: Davina Poke D.O.   On: 07/26/2022 18:11    Microbiology: Results for orders placed or performed during the hospital encounter of 07/26/22  Resp Panel by RT-PCR (Flu A&B, Covid) Anterior Nasal Swab     Status: None   Collection Time: 07/27/22  6:45 AM   Specimen: Anterior Nasal Swab  Result Value Ref Range Status   SARS Coronavirus 2 by RT PCR NEGATIVE NEGATIVE Final    Comment: (NOTE) SARS-CoV-2 target nucleic acids are NOT DETECTED.  The SARS-CoV-2 RNA is generally detectable in upper respiratory specimens during the acute phase of infection. The lowest concentration of SARS-CoV-2 viral copies this assay can detect is 138 copies/mL. A negative result does not preclude SARS-Cov-2 infection and should not be used as the sole basis for treatment or other patient management decisions. A negative result may occur with  improper specimen collection/handling, submission of specimen other than nasopharyngeal swab, presence of viral mutation(s) within the areas targeted by this assay, and inadequate number of viral copies(<138 copies/mL). A negative result must be combined with clinical observations, patient history, and epidemiological information. The expected result is Negative.  Fact Sheet for Patients:  EntrepreneurPulse.com.au  Fact Sheet for Healthcare Providers:   IncredibleEmployment.be  This test is no t yet approved or cleared by the [place] FDA and  has been authorized for detection and/or diagnosis of SARS-CoV-2 by FDA under an Emergency Use Authorization (EUA). This EUA will remain  in effect (meaning this test can be used) for the duration of the COVID-19 declaration under Section 564(b)(1) of the Act, 21 U.S.C.section 360bbb-3(b)(1), unless the authorization is terminated  or revoked sooner.       Influenza A by PCR NEGATIVE NEGATIVE Final   Influenza B by PCR NEGATIVE NEGATIVE Final    Comment: (NOTE) The Xpert Xpress SARS-CoV-2/FLU/RSV plus assay is intended as an aid in the diagnosis of influenza from Nasopharyngeal swab specimens and should not be used as a sole basis for treatment. Nasal washings and aspirates are unacceptable for Xpert Xpress SARS-CoV-2/FLU/RSV testing.  Fact Sheet for Patients: EntrepreneurPulse.com.au  Fact Sheet for Healthcare Providers: IncredibleEmployment.be  This test is not yet approved or cleared by the [place] FDA and has been authorized for detection and/or diagnosis of SARS-CoV-2 by FDA under an Emergency Use Authorization (EUA). This EUA will remain in effect (meaning this test can be used) for the duration of the COVID-19 declaration under Section 564(b)(1) of the Act, 21 U.S.C. section 360bbb-3(b)(1), unless the authorization is terminated or revoked.  Performed at [facility], [place] [address] [place], [place] [address]    Labs: CBC:  Recent Labs  Lab 07/27/22 0237 07/27/22 0639 07/28/22 0556 07/29/22 0442 07/30/22 0544  WBC 18.6*  --  12.1* 11.0* 10.2  HGB 13.5 13.3 10.7* 10.0* 10.4*  HCT 39.2 39.0 31.5* 28.9* 30.7*  MCV 87.7  --  89.2 89.2 90.6  PLT 362  --  237 212 524   Basic Metabolic Panel: Recent Labs  Lab 07/27/22 0237 07/27/22 0630 07/27/22 0639 07/28/22 0556 07/29/22 0442 07/30/22 0544   NA 127* 129* 129* 133* 133* 135  K 2.9* 3.4* 3.5 3.6 3.8 3.4*  CL 90* 92*  --  98 99 103  CO2 17* 22  --  '27 25 26  '$ GLUCOSE 175* 120*  --  86 94 105*  BUN 13 11  --  '16 15 10  '$ CREATININE 1.30* 0.96  --  1.07* 1.25* 0.98  CALCIUM 10.3 9.3  --  9.4 9.1 9.2  MG  --  1.4*  --   --  1.4* 1.5*   Liver Function Tests: Recent Labs  Lab 07/27/22 0237  AST 45*  ALT 24  ALKPHOS 60  BILITOT 0.9  PROT 9.1*  ALBUMIN 4.6   CBG: No results for input(s): "GLUCAP" in the last 168 hours.  Discharge time spent: greater than 30 minutes.  Signed: Berle Mull, MD [facility] 07/30/2022

## 2022-08-17 ENCOUNTER — Inpatient Hospital Stay: Payer: Self-pay

## 2022-08-17 ENCOUNTER — Emergency Department: Payer: Medicare PPO

## 2022-08-17 ENCOUNTER — Inpatient Hospital Stay: Payer: Medicare PPO | Admitting: Radiology

## 2022-08-17 ENCOUNTER — Encounter: Payer: Self-pay | Admitting: Emergency Medicine

## 2022-08-17 ENCOUNTER — Other Ambulatory Visit: Payer: Self-pay

## 2022-08-17 ENCOUNTER — Inpatient Hospital Stay
Admission: EM | Admit: 2022-08-17 | Discharge: 2022-08-24 | DRG: 644 | Disposition: A | Discharge status: Skilled Nursing Facility | Payer: Medicare PPO | Attending: Internal Medicine | Admitting: Internal Medicine

## 2022-08-17 DIAGNOSIS — K219 Gastro-esophageal reflux disease without esophagitis: Secondary | ICD-10-CM | POA: Diagnosis present

## 2022-08-17 DIAGNOSIS — Z8249 Family history of ischemic heart disease and other diseases of the circulatory system: Secondary | ICD-10-CM

## 2022-08-17 DIAGNOSIS — W19XXXA Unspecified fall, initial encounter: Secondary | ICD-10-CM

## 2022-08-17 DIAGNOSIS — I10 Essential (primary) hypertension: Secondary | ICD-10-CM | POA: Diagnosis present

## 2022-08-17 DIAGNOSIS — F419 Anxiety disorder, unspecified: Secondary | ICD-10-CM | POA: Diagnosis present

## 2022-08-17 DIAGNOSIS — E871 Hypo-osmolality and hyponatremia: Secondary | ICD-10-CM | POA: Diagnosis not present

## 2022-08-17 DIAGNOSIS — E222 Syndrome of inappropriate secretion of antidiuretic hormone: Secondary | ICD-10-CM | POA: Diagnosis present

## 2022-08-17 DIAGNOSIS — Y92009 Unspecified place in unspecified non-institutional (private) residence as the place of occurrence of the external cause: Secondary | ICD-10-CM | POA: Diagnosis not present

## 2022-08-17 DIAGNOSIS — D72829 Elevated white blood cell count, unspecified: Secondary | ICD-10-CM | POA: Diagnosis not present

## 2022-08-17 DIAGNOSIS — Z66 Do not resuscitate: Secondary | ICD-10-CM | POA: Diagnosis present

## 2022-08-17 DIAGNOSIS — I5032 Chronic diastolic (congestive) heart failure: Secondary | ICD-10-CM | POA: Diagnosis present

## 2022-08-17 DIAGNOSIS — E039 Hypothyroidism, unspecified: Secondary | ICD-10-CM | POA: Diagnosis present

## 2022-08-17 DIAGNOSIS — M25562 Pain in left knee: Secondary | ICD-10-CM | POA: Diagnosis present

## 2022-08-17 DIAGNOSIS — M25462 Effusion, left knee: Secondary | ICD-10-CM | POA: Diagnosis present

## 2022-08-17 DIAGNOSIS — S82145A Nondisplaced bicondylar fracture of left tibia, initial encounter for closed fracture: Secondary | ICD-10-CM | POA: Diagnosis present

## 2022-08-17 DIAGNOSIS — Z79899 Other long term (current) drug therapy: Secondary | ICD-10-CM

## 2022-08-17 DIAGNOSIS — Z20822 Contact with and (suspected) exposure to covid-19: Secondary | ICD-10-CM | POA: Diagnosis present

## 2022-08-17 DIAGNOSIS — N179 Acute kidney failure, unspecified: Secondary | ICD-10-CM | POA: Diagnosis present

## 2022-08-17 DIAGNOSIS — E785 Hyperlipidemia, unspecified: Secondary | ICD-10-CM | POA: Diagnosis present

## 2022-08-17 DIAGNOSIS — R7989 Other specified abnormal findings of blood chemistry: Secondary | ICD-10-CM | POA: Diagnosis not present

## 2022-08-17 DIAGNOSIS — Z888 Allergy status to other drugs, medicaments and biological substances status: Secondary | ICD-10-CM

## 2022-08-17 DIAGNOSIS — Z9049 Acquired absence of other specified parts of digestive tract: Secondary | ICD-10-CM

## 2022-08-17 DIAGNOSIS — E872 Acidosis, unspecified: Secondary | ICD-10-CM | POA: Diagnosis present

## 2022-08-17 DIAGNOSIS — N390 Urinary tract infection, site not specified: Secondary | ICD-10-CM

## 2022-08-17 DIAGNOSIS — Z8673 Personal history of transient ischemic attack (TIA), and cerebral infarction without residual deficits: Secondary | ICD-10-CM

## 2022-08-17 DIAGNOSIS — D509 Iron deficiency anemia, unspecified: Secondary | ICD-10-CM | POA: Diagnosis present

## 2022-08-17 DIAGNOSIS — F418 Other specified anxiety disorders: Secondary | ICD-10-CM | POA: Diagnosis present

## 2022-08-17 DIAGNOSIS — E860 Dehydration: Secondary | ICD-10-CM | POA: Diagnosis present

## 2022-08-17 DIAGNOSIS — Z853 Personal history of malignant neoplasm of breast: Secondary | ICD-10-CM

## 2022-08-17 DIAGNOSIS — E876 Hypokalemia: Secondary | ICD-10-CM | POA: Diagnosis present

## 2022-08-17 DIAGNOSIS — Z91040 Latex allergy status: Secondary | ICD-10-CM

## 2022-08-17 DIAGNOSIS — I130 Hypertensive heart and chronic kidney disease with heart failure and stage 1 through stage 4 chronic kidney disease, or unspecified chronic kidney disease: Secondary | ICD-10-CM | POA: Diagnosis present

## 2022-08-17 DIAGNOSIS — F32A Depression, unspecified: Secondary | ICD-10-CM | POA: Diagnosis present

## 2022-08-17 DIAGNOSIS — N1832 Chronic kidney disease, stage 3b: Secondary | ICD-10-CM | POA: Diagnosis present

## 2022-08-17 DIAGNOSIS — Z90710 Acquired absence of both cervix and uterus: Secondary | ICD-10-CM

## 2022-08-17 DIAGNOSIS — Z885 Allergy status to narcotic agent status: Secondary | ICD-10-CM

## 2022-08-17 DIAGNOSIS — Z79890 Hormone replacement therapy (postmenopausal): Secondary | ICD-10-CM

## 2022-08-17 DIAGNOSIS — Z9012 Acquired absence of left breast and nipple: Secondary | ICD-10-CM

## 2022-08-17 DIAGNOSIS — R6510 Systemic inflammatory response syndrome (SIRS) of non-infectious origin without acute organ dysfunction: Secondary | ICD-10-CM

## 2022-08-17 HISTORY — PX: IR FLUORO GUIDE CV LINE LEFT: IMG2282

## 2022-08-17 HISTORY — DX: Urinary tract infection, site not specified: N39.0

## 2022-08-17 HISTORY — DX: Other specified abnormal findings of blood chemistry: R79.89

## 2022-08-17 HISTORY — PX: IR US GUIDE VASC ACCESS RIGHT: IMG2390

## 2022-08-17 HISTORY — PX: IR US GUIDE VASC ACCESS LEFT: IMG2389

## 2022-08-17 LAB — URINALYSIS, ROUTINE W REFLEX MICROSCOPIC
Bacteria, UA: NONE SEEN
Bilirubin Urine: NEGATIVE
Glucose, UA: NEGATIVE mg/dL
Hgb urine dipstick: NEGATIVE
Ketones, ur: NEGATIVE mg/dL
Nitrite: NEGATIVE
Protein, ur: 30 mg/dL — AB
Specific Gravity, Urine: 1.015 (ref 1.005–1.030)
pH: 7 (ref 5.0–8.0)

## 2022-08-17 LAB — OSMOLALITY: Osmolality: 266 mOsm/kg — ABNORMAL LOW (ref 275–295)

## 2022-08-17 LAB — MAGNESIUM: Magnesium: 1.8 mg/dL (ref 1.7–2.4)

## 2022-08-17 LAB — CBC WITH DIFFERENTIAL/PLATELET
Abs Immature Granulocytes: 0.15 10*3/uL — ABNORMAL HIGH (ref 0.00–0.07)
Basophils Absolute: 0.1 10*3/uL (ref 0.0–0.1)
Basophils Relative: 0 %
Eosinophils Absolute: 0 10*3/uL (ref 0.0–0.5)
Eosinophils Relative: 0 %
HCT: 37 % (ref 36.0–46.0)
Hemoglobin: 12.9 g/dL (ref 12.0–15.0)
Immature Granulocytes: 1 %
Lymphocytes Relative: 9 %
Lymphs Abs: 1.4 10*3/uL (ref 0.7–4.0)
MCH: 29.7 pg (ref 26.0–34.0)
MCHC: 34.9 g/dL (ref 30.0–36.0)
MCV: 85.1 fL (ref 80.0–100.0)
Monocytes Absolute: 1.5 10*3/uL — ABNORMAL HIGH (ref 0.1–1.0)
Monocytes Relative: 9 %
Neutro Abs: 13.7 10*3/uL — ABNORMAL HIGH (ref 1.7–7.7)
Neutrophils Relative %: 81 %
Platelets: 322 10*3/uL (ref 150–400)
RBC: 4.35 MIL/uL (ref 3.87–5.11)
RDW: 13.1 % (ref 11.5–15.5)
WBC: 16.8 10*3/uL — ABNORMAL HIGH (ref 4.0–10.5)
nRBC: 0 % (ref 0.0–0.2)

## 2022-08-17 LAB — COMPREHENSIVE METABOLIC PANEL
ALT: 22 U/L (ref 0–44)
AST: 51 U/L — ABNORMAL HIGH (ref 15–41)
Albumin: 4.6 g/dL (ref 3.5–5.0)
Alkaline Phosphatase: 57 U/L (ref 38–126)
Anion gap: 19 — ABNORMAL HIGH (ref 5–15)
BUN: 26 mg/dL — ABNORMAL HIGH (ref 8–23)
CO2: 17 mmol/L — ABNORMAL LOW (ref 22–32)
Calcium: 9.9 mg/dL (ref 8.9–10.3)
Chloride: 89 mmol/L — ABNORMAL LOW (ref 98–111)
Creatinine, Ser: 1.65 mg/dL — ABNORMAL HIGH (ref 0.44–1.00)
GFR, Estimated: 32 mL/min — ABNORMAL LOW (ref >60)
Glucose, Bld: 101 mg/dL — ABNORMAL HIGH (ref 70–99)
Potassium: 3.9 mmol/L (ref 3.5–5.1)
Sodium: 125 mmol/L — ABNORMAL LOW (ref 135–145)
Total Bilirubin: 1.1 mg/dL (ref 0.3–1.2)
Total Protein: 9.2 g/dL — ABNORMAL HIGH (ref 6.5–8.1)

## 2022-08-17 LAB — BASIC METABOLIC PANEL
Anion gap: 10 (ref 5–15)
BUN: 28 mg/dL — ABNORMAL HIGH (ref 8–23)
CO2: 23 mmol/L (ref 22–32)
Calcium: 8.8 mg/dL — ABNORMAL LOW (ref 8.9–10.3)
Chloride: 93 mmol/L — ABNORMAL LOW (ref 98–111)
Creatinine, Ser: 1.68 mg/dL — ABNORMAL HIGH (ref 0.44–1.00)
GFR, Estimated: 31 mL/min — ABNORMAL LOW (ref >60)
Glucose, Bld: 115 mg/dL — ABNORMAL HIGH (ref 70–99)
Potassium: 3 mmol/L — ABNORMAL LOW (ref 3.5–5.1)
Sodium: 126 mmol/L — ABNORMAL LOW (ref 135–145)

## 2022-08-17 LAB — BRAIN NATRIURETIC PEPTIDE: B Natriuretic Peptide: 33 pg/mL (ref 0.0–100.0)

## 2022-08-17 LAB — OSMOLALITY, URINE: Osmolality, Ur: 471 mOsm/kg (ref 300–900)

## 2022-08-17 LAB — TROPONIN I (HIGH SENSITIVITY)
Troponin I (High Sensitivity): 18 ng/L — ABNORMAL HIGH (ref <18)
Troponin I (High Sensitivity): 17 ng/L (ref <18)

## 2022-08-17 LAB — SODIUM, URINE, RANDOM: Sodium, Ur: 67 mmol/L

## 2022-08-17 LAB — SARS CORONAVIRUS 2 BY RT PCR: SARS Coronavirus 2 by RT PCR: NEGATIVE

## 2022-08-17 LAB — LACTIC ACID, PLASMA
Lactic Acid, Venous: 3 mmol/L (ref 0.5–1.9)
Lactic Acid, Venous: 0.9 mmol/L (ref 0.5–1.9)

## 2022-08-17 LAB — PROCALCITONIN: Procalcitonin: 0.34 ng/mL

## 2022-08-17 MED ORDER — LEVOTHYROXINE SODIUM 50 MCG PO TABS
75.0000 ug | ORAL_TABLET | Freq: Every day | ORAL | Status: DC
  Administered 2022-08-18 – 2022-08-24 (×7): 75 ug via ORAL
  Filled 2022-08-17 (×7): qty 1

## 2022-08-17 MED ORDER — SODIUM CHLORIDE 0.9 % IV BOLUS (SEPSIS)
500.0000 mL | Freq: Once | INTRAVENOUS | Status: AC
  Administered 2022-08-17: 500 mL via INTRAVENOUS

## 2022-08-17 MED ORDER — LIDOCAINE 1% INJECTION FOR CIRCUMCISION
5.0000 mL | INJECTION | Freq: Once | INTRAVENOUS | Status: AC
  Administered 2022-08-17: 5 mL via SUBCUTANEOUS
  Filled 2022-08-17: qty 5

## 2022-08-17 MED ORDER — MORPHINE SULFATE (PF) 2 MG/ML IV SOLN
1.0000 mg | INTRAVENOUS | Status: DC | PRN
  Administered 2022-08-17 – 2022-08-23 (×4): 1 mg via INTRAVENOUS
  Filled 2022-08-17 (×4): qty 1

## 2022-08-17 MED ORDER — PANTOPRAZOLE SODIUM 40 MG PO TBEC
40.0000 mg | DELAYED_RELEASE_TABLET | Freq: Every day | ORAL | Status: DC
  Administered 2022-08-17 – 2022-08-24 (×8): 40 mg via ORAL
  Filled 2022-08-17 (×8): qty 1

## 2022-08-17 MED ORDER — ASPIRIN 81 MG PO TBEC
81.0000 mg | DELAYED_RELEASE_TABLET | Freq: Every day | ORAL | Status: DC
  Administered 2022-08-17 – 2022-08-20 (×4): 81 mg via ORAL
  Filled 2022-08-17 (×4): qty 1

## 2022-08-17 MED ORDER — ADULT MULTIVITAMIN W/MINERALS CH
1.0000 | ORAL_TABLET | Freq: Every day | ORAL | Status: DC
  Administered 2022-08-17 – 2022-08-23 (×7): 1 via ORAL
  Filled 2022-08-17 (×8): qty 1

## 2022-08-17 MED ORDER — POLYETHYLENE GLYCOL 3350 17 G PO PACK: 17.0000 g | PACK | Freq: Every day | ORAL | Status: DC | PRN

## 2022-08-17 MED ORDER — CHLORHEXIDINE GLUCONATE CLOTH 2 % EX PADS
6.0000 | MEDICATED_PAD | Freq: Every day | CUTANEOUS | Status: DC
  Administered 2022-08-17 – 2022-08-24 (×8): 6 via TOPICAL

## 2022-08-17 MED ORDER — AMLODIPINE BESYLATE 5 MG PO TABS
5.0000 mg | ORAL_TABLET | Freq: Every day | ORAL | Status: DC
  Administered 2022-08-17 – 2022-08-23 (×7): 5 mg via ORAL
  Filled 2022-08-17 (×7): qty 1

## 2022-08-17 MED ORDER — POLYVINYL ALCOHOL 1.4 % OP SOLN: 1.0000 [drp] | Freq: Every day | OPHTHALMIC | Status: DC | PRN

## 2022-08-17 MED ORDER — ONDANSETRON HCL 4 MG/2ML IJ SOLN
4.0000 mg | Freq: Once | INTRAMUSCULAR | Status: AC
  Administered 2022-08-17: 4 mg via INTRAVENOUS
  Filled 2022-08-17: qty 2

## 2022-08-17 MED ORDER — MECLIZINE HCL 25 MG PO TABS: 12.5000 mg | ORAL_TABLET | Freq: Three times a day (TID) | ORAL | Status: DC | PRN

## 2022-08-17 MED ORDER — ACETAMINOPHEN 325 MG PO TABS
650.0000 mg | ORAL_TABLET | Freq: Four times a day (QID) | ORAL | Status: DC | PRN
  Administered 2022-08-18 – 2022-08-23 (×8): 650 mg via ORAL
  Filled 2022-08-17 (×8): qty 2

## 2022-08-17 MED ORDER — ENOXAPARIN SODIUM 30 MG/0.3ML IJ SOSY
30.0000 mg | PREFILLED_SYRINGE | INTRAMUSCULAR | Status: DC
  Administered 2022-08-17 – 2022-08-18 (×2): 30 mg via SUBCUTANEOUS
  Filled 2022-08-17 (×2): qty 0.3

## 2022-08-17 MED ORDER — VITAMIN D 25 MCG (1000 UNIT) PO TABS
5000.0000 [IU] | ORAL_TABLET | Freq: Every day | ORAL | Status: DC
  Administered 2022-08-18 – 2022-08-24 (×7): 5000 [IU] via ORAL
  Filled 2022-08-17 (×7): qty 5

## 2022-08-17 MED ORDER — SODIUM CHLORIDE 1 G PO TABS
1.0000 g | ORAL_TABLET | Freq: Two times a day (BID) | ORAL | Status: DC
  Administered 2022-08-17 – 2022-08-18 (×3): 1 g via ORAL
  Filled 2022-08-17 (×3): qty 1

## 2022-08-17 MED ORDER — SODIUM CHLORIDE 0.9 % IV SOLN
12.5000 mg | Freq: Four times a day (QID) | INTRAVENOUS | Status: DC | PRN
  Administered 2022-08-18: 12.5 mg via INTRAVENOUS
  Filled 2022-08-17 (×2): qty 0.5

## 2022-08-17 MED ORDER — MAGNESIUM OXIDE 400 MG PO TABS
400.0000 mg | ORAL_TABLET | Freq: Every day | ORAL | Status: DC
  Administered 2022-08-17 – 2022-08-24 (×8): 400 mg via ORAL
  Filled 2022-08-17 (×15): qty 1

## 2022-08-17 MED ORDER — METOPROLOL SUCCINATE ER 50 MG PO TB24
50.0000 mg | ORAL_TABLET | Freq: Two times a day (BID) | ORAL | Status: DC
  Administered 2022-08-17 – 2022-08-24 (×15): 50 mg via ORAL
  Filled 2022-08-17 (×16): qty 1

## 2022-08-17 MED ORDER — LORAZEPAM 0.5 MG PO TABS
0.5000 mg | ORAL_TABLET | Freq: Every day | ORAL | Status: DC
  Administered 2022-08-17 – 2022-08-24 (×8): 0.5 mg via ORAL
  Filled 2022-08-17 (×8): qty 1

## 2022-08-17 MED ORDER — TRAMADOL HCL 50 MG PO TABS
50.0000 mg | ORAL_TABLET | Freq: Two times a day (BID) | ORAL | Status: DC | PRN
  Administered 2022-08-18: 50 mg via ORAL
  Filled 2022-08-17: qty 1

## 2022-08-17 MED ORDER — SODIUM CHLORIDE 0.9 % IV BOLUS (SEPSIS)
1000.0000 mL | Freq: Once | INTRAVENOUS | Status: AC
  Administered 2022-08-17: 1000 mL via INTRAVENOUS

## 2022-08-17 MED ORDER — VANCOMYCIN HCL 1250 MG/250ML IV SOLN
1250.0000 mg | Freq: Once | INTRAVENOUS | Status: AC
  Administered 2022-08-17: 1250 mg via INTRAVENOUS
  Filled 2022-08-17 (×2): qty 250

## 2022-08-17 MED ORDER — FERROUS SULFATE 325 (65 FE) MG PO TABS
325.0000 mg | ORAL_TABLET | Freq: Every day | ORAL | Status: DC
  Administered 2022-08-18 – 2022-08-24 (×7): 325 mg via ORAL
  Filled 2022-08-17 (×7): qty 1

## 2022-08-17 MED ORDER — DOCUSATE SODIUM 100 MG PO CAPS
100.0000 mg | ORAL_CAPSULE | Freq: Two times a day (BID) | ORAL | Status: DC
  Administered 2022-08-17 – 2022-08-24 (×15): 100 mg via ORAL
  Filled 2022-08-17 (×15): qty 1

## 2022-08-17 MED ORDER — MUSCLE RUB 10-15 % EX CREA
1.0000 | TOPICAL_CREAM | Freq: Two times a day (BID) | CUTANEOUS | Status: DC
  Administered 2022-08-18 – 2022-08-24 (×13): 1 via TOPICAL
  Filled 2022-08-17: qty 85

## 2022-08-17 MED ORDER — BISACODYL 10 MG RE SUPP
10.0000 mg | Freq: Every day | RECTAL | Status: DC
  Administered 2022-08-17 – 2022-08-23 (×5): 10 mg via RECTAL
  Filled 2022-08-17 (×5): qty 1

## 2022-08-17 MED ORDER — SODIUM CHLORIDE 0.9 % IV SOLN
1.0000 g | INTRAVENOUS | Status: DC
  Administered 2022-08-18 – 2022-08-19 (×2): 1 g via INTRAVENOUS
  Filled 2022-08-17 (×2): qty 1

## 2022-08-17 MED ORDER — FLUTICASONE PROPIONATE 50 MCG/ACT NA SUSP: 1.0000 | Freq: Every day | NASAL | Status: DC | PRN

## 2022-08-17 MED ORDER — ALUM & MAG HYDROXIDE-SIMETH 200-200-20 MG/5ML PO SUSP: 30.0000 mL | Freq: Four times a day (QID) | ORAL | Status: DC | PRN

## 2022-08-17 MED ORDER — SODIUM CHLORIDE 0.9 % IV SOLN: INTRAVENOUS | Status: DC

## 2022-08-17 MED ORDER — ONDANSETRON HCL 4 MG/2ML IJ SOLN
4.0000 mg | Freq: Three times a day (TID) | INTRAMUSCULAR | Status: DC | PRN
  Administered 2022-08-17 – 2022-08-22 (×4): 4 mg via INTRAVENOUS
  Filled 2022-08-17 (×4): qty 2

## 2022-08-17 MED ORDER — HYDRALAZINE HCL 20 MG/ML IJ SOLN: 5.0000 mg | INTRAMUSCULAR | Status: DC | PRN

## 2022-08-17 MED ORDER — SODIUM CHLORIDE 0.9 % IV SOLN
2.0000 g | Freq: Once | INTRAVENOUS | Status: AC
  Administered 2022-08-17: 2 g via INTRAVENOUS
  Filled 2022-08-17: qty 12.5

## 2022-08-17 MED ORDER — TROLAMINE SALICYLATE 10 % EX CREA
1.0000 | TOPICAL_CREAM | CUTANEOUS | Status: DC | PRN
  Administered 2022-08-18: 1 via TOPICAL
  Filled 2022-08-17: qty 85

## 2022-08-17 MED ORDER — MUSCLE RUB 10-15 % EX CREA
1.0000 | TOPICAL_CREAM | Freq: Two times a day (BID) | CUTANEOUS | Status: DC
  Filled 2022-08-17: qty 85

## 2022-08-17 MED ORDER — FLUOXETINE HCL 20 MG PO CAPS
20.0000 mg | ORAL_CAPSULE | Freq: Every day | ORAL | Status: DC
  Administered 2022-08-17 – 2022-08-24 (×8): 20 mg via ORAL
  Filled 2022-08-17 (×8): qty 1

## 2022-08-17 MED ORDER — CLONIDINE HCL 0.1 MG PO TABS
0.1000 mg | ORAL_TABLET | Freq: Two times a day (BID) | ORAL | Status: DC
  Administered 2022-08-17 – 2022-08-22 (×12): 0.1 mg via ORAL
  Filled 2022-08-17 (×14): qty 1

## 2022-08-17 MED ORDER — FENTANYL CITRATE PF 50 MCG/ML IJ SOSY
50.0000 ug | PREFILLED_SYRINGE | Freq: Once | INTRAMUSCULAR | Status: AC
  Administered 2022-08-17: 50 ug via INTRAVENOUS
  Filled 2022-08-17: qty 1

## 2022-08-17 NOTE — ED Notes (Signed)
Pt has fallen at home and son said he helped her to the ground. Pt says she hit her head and it feels floaty. Pt is alert and non-ambulatory.

## 2022-08-17 NOTE — H&P (Signed)
History and Physical    Cindy Huerta VZD:[id number] DOB: 1946/07/11 DOA: 08/17/2022  Referring MD/NP/PA:   PCP: Juluis Pitch, MD   Patient coming from:  The patient is coming from home.    Chief Complaint: fall and dysuria  HPI: Cindy Huerta is a 76 y.o. female with medical history significant of HTN, hyperlipidemia, TIA, GERD, hypertension, depression with anxiety, CKD-3b, GI bleeding, dCHF, hyponatremia, who presents with fall and dysuria.  Per his son (I called her son by phone), patient fell twice yesterday.  He injured her left knee and occipital head. In the second fall, pt seemed to be less responsive for a while per her son. No seizure activity. Pt does not have unilateral numbness or tingling in extremities.  No facial droop or slurred speech.  Denies chest pain, cough, shortness of breath.  No nausea, vomiting, diarrhea or abdominal pain.  Patient reports dysuria and burning on urination.  No hematuria.   Data reviewed independently and ED Course: pt was found to have WBC 16.8, urinalysis (hazy appearance, small amount leukocyte, negative bacteria, WBC 6-10), worsening renal function, sodium 125, abnormal liver function (ALP 57, AST 51, ALT 22, total bilirubin 1.1), temperature normal, blood pressure 126/75, heart rate is 73, 19, oxygen saturation 100% on room air.  Chest x-ray negative.  CT of head is negative for acute intracranial abnormalities.  CT of C-spine is negative for acute injury.  X-ray of left knee is negative for bony fracture.  Patient is admitted to telemetry bed as inpatient.  EKG: I have personally reviewed.  Sinus rhythm, QTc 480, LAE, LAD, poor R wave progression.   Review of Systems:   General: no fevers, chills, no body weight gain, has fatigue HEENT: no blurry vision, hearing changes or sore throat Respiratory: no dyspnea, coughing, wheezing CV: no chest pain, no palpitations GI: no nausea, vomiting, abdominal pain, diarrhea, constipation GU:  has dysuria, burning on urination, increased urinary frequency, no hematuria  Ext: no leg edema Neuro: no unilateral weakness, numbness, or tingling, no vision change or hearing loss. has fall Skin: no rash, no skin tear. MSK: No muscle spasm, no deformity, no limitation of range of movement in spin. Has pain in left knee Heme: No easy bruising.  Travel history: No recent [place] distant travel.   Allergy:  Allergies  Allergen Reactions   Latex Rash   Novocain [Procaine] Other (See Comments)    Unsure - told by DDS not to let anyone give it to her  Confusion    Zestril [Lisinopril] Cough   Benadryl [Diphenhydramine] Other (See Comments)    Jitteriness Agitation   Biaxin [Clarithromycin] Other (See Comments)    Confusion     Roxicodone [Oxycodone] Nausea And Vomiting and Anxiety    Past Medical History:  Diagnosis Date   Arthritis    Breast cancer ([place])    Depression    GERD (gastroesophageal reflux disease)    HLD (hyperlipidemia)    Hypertension    Hypothyroidism     Past Surgical History:  Procedure Laterality Date   ABDOMINAL HYSTERECTOMY  1982   AXILLARY SENTINEL NODE BIOPSY Left 11/20/2021   Procedure: AXILLARY SENTINEL NODE BIOPSY;  Surgeon: Benjamine Sprague, DO;  Location: [facility] ORS;  Service: General;  Laterality: Left;   BREAST BIOPSY Left 09/23/2021   3:00 13cmfn venus marker, path pending   BREAST BIOPSY Left 09/23/2021   3:00 14 cmfn heart marker, path pending   CHOLECYSTECTOMY     ESOPHAGOGASTRODUODENOSCOPY (EGD) WITH PROPOFOL  N/A 10/08/2021   Procedure: ESOPHAGOGASTRODUODENOSCOPY (EGD) WITH PROPOFOL;  Surgeon: Jonathon Bellows, MD;  Location: [facility] ENDOSCOPY;  Service: Gastroenterology;  Laterality: N/A;   HEMATOMA EVACUATION Left 11/21/2021   Procedure: EVACUATION HEMATOMA;  Surgeon: Benjamine Sprague, DO;  Location: [facility] ORS;  Service: General;  Laterality: Left;   IR FLUORO GUIDE CV LINE LEFT  08/17/2022   IR US GUIDE VASC ACCESS LEFT  08/17/2022   IR US GUIDE VASC  ACCESS RIGHT  08/17/2022   left lumpectomy  2019   TOTAL MASTECTOMY Left 11/20/2021   Procedure: TOTAL MASTECTOMY;  Surgeon: Benjamine Sprague, DO;  Location: [facility] ORS;  Service: General;  Laterality: Left;    Social History:  reports that she has never smoked. She has never used smokeless tobacco. She reports that she does not drink alcohol and does not use drugs.  Family History:  Family History  Problem Relation Age of Onset   Heart disease Mother    Cancer Paternal Grandmother      Prior to Admission medications   Medication Sig Start Date End Date Taking? Authorizing Provider  amLODipine (NORVASC) 5 MG tablet Take 1 tablet (5 mg total) by mouth daily. 07/30/22  Yes Lavina Hamman, MD  BIOFREEZE 4 % GEL Apply 1 application  topically 2 (two) times daily. 07/15/22  Yes [provider]  bisacodyl (DULCOLAX) 10 MG suppository Place 1 suppository (10 mg total) rectally daily at 12 noon. 07/30/22  Yes Lavina Hamman, MD  Cholecalciferol (VITAMIN D-3 PO) Take 1 capsule by mouth daily.   Yes [provider]  cloNIDine (CATAPRES) 0.1 MG tablet Take 0.1 mg by mouth 2 (two) times daily. 05/16/22  Yes [provider]  docusate sodium (COLACE) 100 MG capsule Take 1 capsule (100 mg total) by mouth 2 (two) times daily. 07/01/22  Yes Danford, Suann Larry, MD  Ferrous Sulfate (IRON PO) Take 1 tablet by mouth daily.   Yes [provider]  FLUoxetine (PROZAC) 20 MG capsule Take 20 mg by mouth daily.   Yes [provider]  levothyroxine (SYNTHROID) 75 MCG tablet Take 75 mcg by mouth daily before breakfast. 02/10/21  Yes [provider]  LORazepam (ATIVAN) 0.5 MG tablet Take 1 tablet (0.5 mg total) by mouth daily. 07/01/22  Yes Danford, Suann Larry, MD  magnesium oxide (MAG-OX) 400 MG tablet Take 1 tablet (400 mg total) by mouth daily. 07/30/22  Yes Lavina Hamman, MD  metoprolol succinate (TOPROL-XL) 50 MG 24 hr tablet Take 50 mg by mouth 2 (two) times  daily. 03/18/21  Yes [provider]  Multiple Vitamins-Minerals (THERA-M) TABS Take 1 tablet by mouth daily.   Yes [provider]  pantoprazole (PROTONIX) 40 MG tablet Take 1 tablet (40 mg total) by mouth daily. 10/10/21 09/11/22 Yes Sreenath, Sudheer B, MD  polyethylene glycol powder (GLYCOLAX/MIRALAX) 17 GM/SCOOP powder Take 1 capful (17 g) with water by mouth daily. 07/30/22  Yes Lavina Hamman, MD  potassium chloride SA (KLOR-CON) 20 MEQ tablet Take 20 mEq by mouth daily.   Yes [provider]  telmisartan (MICARDIS) 80 MG tablet Take 1 tablet (80 mg total) by mouth daily. 07/30/22  Yes Lavina Hamman, MD  acetaminophen (TYLENOL) 500 MG tablet Take 500-1,000 mg by mouth every 6 (six) hours as needed for moderate pain.    [provider]  alum & mag hydroxide-simeth (MAALOX/MYLANTA) 200-200-20 MG/5ML suspension Take 30 mLs by mouth every 6 (six) hours as needed for indigestion or heartburn.  [provider]  ARTIFICIAL TEARS 0.1-0.3 % SOLN Place 1 drop into both eyes daily as needed for dry eyes. 07/15/22   [provider]  aspirin 81 MG EC tablet Take 1 tablet (81 mg total) by mouth daily. RESTART 48HRS AFTER DISCHARGE Patient not taking: Reported on 07/27/2022 11/25/21   Benjamine Sprague, DO  atorvastatin (LIPITOR) 80 MG tablet Take 80 mg by mouth daily. Patient not taking: Reported on 07/27/2022 09/21/21   [provider]  fluticasone (FLONASE) 50 MCG/ACT nasal spray Place 1 spray into both nostrils daily as needed for allergies or rhinitis.    [provider]  meclizine (ANTIVERT) 12.5 MG tablet Take 12.5 mg by mouth 3 (three) times daily as needed for dizziness.    [provider]  Nutritional Supplements (ENSURE NUTRITION SHAKE) LIQD Take 1 Bottle by mouth 2 (two) times daily as needed (loss of appetite).    [provider]  ondansetron (ZOFRAN-ODT) 4 MG disintegrating tablet Take 1 tablet (4 mg total) by  mouth every 8 (eight) hours as needed for nausea or vomiting. 07/30/22   Lavina Hamman, MD  traMADol (ULTRAM) 50 MG tablet Take 1 tablet (50 mg total) by mouth every 6 (six) hours as needed for up to 10 doses for moderate pain. 07/01/22   Danford, Suann Larry, MD  trolamine salicylate (ASPERCREME) 10 % cream Apply 1 application topically as needed for muscle pain.    [provider]  vitamin B-12 (CYANOCOBALAMIN) 100 MCG tablet Take 100 mg by mouth 2 (two) times daily. Patient not taking: Reported on 07/27/2022    [provider]    Physical Exam: Vitals:   08/17/22 1000 08/17/22 1300 08/17/22 1514 08/17/22 1618  BP: 122/80 124/85 (!) 142/57   Pulse: 72 80 68   Resp: '18 16 18   '$ Temp: 98.6 F (37 C) 98.8 F (37.1 C) 99 F (37.2 C)   TempSrc: Oral Oral    SpO2: 100% 98% 100%   Weight:      Height:    '5\' 6"'$  (1.676 m)   General: Not in acute distress HEENT:       Eyes: PERRL, EOMI, no scleral icterus.       ENT: No discharge from the ears and nose, no pharynx injection, no tonsillar enlargement.        Neck: No JVD, no bruit, no mass felt. Heme: No neck lymph node enlargement. Cardiac: S1/S2, RRR, No murmurs, No gallops or rubs. Respiratory: No rales, wheezing, rhonchi or rubs. GI: Soft, nondistended, nontender, no rebound pain, no organomegaly, BS present. GU: No hematuria Ext: No pitting leg edema bilaterally. 1+DP/PT pulse bilaterally. Musculoskeletal: No joint deformities, No joint redness or warmth, no limitation of ROM in spin. Has tenderness in left knee Skin: No rashes.  Neuro: Alert, oriented X3, cranial nerves II-XII grossly intact, moves all extremities normally Psych: Patient is not psychotic, no suicidal or hemocidal ideation.  Labs on Admission: I have personally reviewed following labs and imaging studies  CBC: Recent Labs  Lab 08/17/22 0403  WBC 16.8*  NEUTROABS 13.7*  HGB 12.9  HCT 37.0  MCV 85.1  PLT 124   Basic Metabolic  Panel: Recent Labs  Lab 08/17/22 0321 08/17/22 0613 08/17/22 1607  NA 125*  --  126*  K 3.9  --  3.0*  CL 89*  --  93*  CO2 17*  --  23  GLUCOSE 101*  --  115*  BUN 26*  --  28*  CREATININE 1.65*  --  1.68*  CALCIUM 9.9  --  8.8*  MG  --  1.8  --    GFR: Estimated Creatinine Clearance: 28 mL/min (A) (by C-G formula based on SCr of 1.68 mg/dL (H)). Liver Function Tests: Recent Labs  Lab 08/17/22 0321  AST 51*  ALT 22  ALKPHOS 57  BILITOT 1.1  PROT 9.2*  ALBUMIN 4.6   No results for input(s): "LIPASE", "AMYLASE" in the last 168 hours. No results for input(s): "AMMONIA" in the last 168 hours. Coagulation Profile: No results for input(s): "INR", "PROTIME" in the last 168 hours. Cardiac Enzymes: No results for input(s): "CKTOTAL", "CKMB", "CKMBINDEX", "TROPONINI" in the last 168 hours. BNP (last 3 results) No results for input(s): "PROBNP" in the last 8760 hours. HbA1C: No results for input(s): "HGBA1C" in the last 72 hours. CBG: No results for input(s): "GLUCAP" in the last 168 hours. Lipid Profile: No results for input(s): "CHOL", "HDL", "LDLCALC", "TRIG", "CHOLHDL", "LDLDIRECT" in the last 72 hours. Thyroid Function Tests: No results for input(s): "TSH", "T4TOTAL", "FREET4", "T3FREE", "THYROIDAB" in the last 72 hours. Anemia Panel: No results for input(s): "VITAMINB12", "FOLATE", "FERRITIN", "TIBC", "IRON", "RETICCTPCT" in the last 72 hours. Urine analysis:    Component Value Date/Time   COLORURINE YELLOW (A) 08/17/2022 0246   APPEARANCEUR HAZY (A) 08/17/2022 0246   LABSPEC 1.015 08/17/2022 0246   PHURINE 7.0 08/17/2022 0246   GLUCOSEU NEGATIVE 08/17/2022 0246   HGBUR NEGATIVE 08/17/2022 0246   BILIRUBINUR NEGATIVE 08/17/2022 0246   KETONESUR NEGATIVE 08/17/2022 0246   PROTEINUR 30 (A) 08/17/2022 0246   NITRITE NEGATIVE 08/17/2022 0246   LEUKOCYTESUR SMALL (A) 08/17/2022 0246   Sepsis Labs: '@LABRCNTIP'$ (procalcitonin:4,lacticidven:4) ) Recent Results (from  the past 240 hour(s))  SARS Coronavirus 2 by RT PCR (hospital order, performed in [place] hospital lab) *cepheid single result test* Anterior Nasal Swab     Status: None   Collection Time: 08/17/22  5:47 AM   Specimen: Anterior Nasal Swab  Result Value Ref Range Status   SARS Coronavirus 2 by RT PCR NEGATIVE NEGATIVE Final    Comment: (NOTE) SARS-CoV-2 target nucleic acids are NOT DETECTED.  The SARS-CoV-2 RNA is generally detectable in upper and lower respiratory specimens during the acute phase of infection. The lowest concentration of SARS-CoV-2 viral copies this assay can detect is 250 copies / mL. A negative result does not preclude SARS-CoV-2 infection and should not be used as the sole basis for treatment or other patient management decisions.  A negative result may occur with improper specimen collection / handling, submission of specimen other than nasopharyngeal swab, presence of viral mutation(s) within the areas targeted by this assay, and inadequate number of viral copies (<250 copies / mL). A negative result must be combined with clinical observations, patient history, and epidemiological information.  Fact Sheet for Patients:   https://www.patel.info/  Fact Sheet for Healthcare Providers: https://hall.com/  This test is not yet approved or  cleared by the [place] FDA and has been authorized for detection and/or diagnosis of SARS-CoV-2 by FDA under an Emergency Use Authorization (EUA).  This EUA will remain in effect (meaning this test can be used) for the duration of the COVID-19 declaration under Section 564(b)(1) of the Act, 21 U.S.C. section 360bbb-3(b)(1), unless the authorization is terminated or revoked sooner.  Performed at [facility], [address], [place], [place] [address]      Radiological Exams on Admission: IR Fluoro Guide CV Line Left  Result Date: 08/17/2022  INDICATION:  76 year old woman with sepsis and poor venous access presents to IR for central line placement. EXAM: 1. Ultrasound-guided access of right internal jugular vein. 2. Ultrasound-guided access of left internal jugular vein. 3. Left internal jugular vein non tunneled triple-lumen central line placement. MEDICATIONS: None ANESTHESIA/SEDATION: None FLUOROSCOPY: Radiation Exposure Index (as provided by the fluoroscopic device): 84 mGy Kerma COMPLICATIONS: None immediate. PROCEDURE: Informed written consent was obtained from the patient after a thorough discussion of the procedural risks, benefits and alternatives. All questions were addressed. Maximal Sterile Barrier Technique was utilized including caps, mask, sterile gowns, sterile gloves, sterile drape, hand hygiene and skin antiseptic. A timeout was performed prior to the initiation of the procedure. Ultrasound image documenting patency of the right internal jugular vein was obtained and placed in permanent medical record. Sterile ultrasound probe cover and gel utilized throughout the procedure. Utilizing continuous ultrasound guidance, the right internal jugular vein was accessed with a 21 gauge needle. 0.018 inch Nitrex guidewire could not be successfully advanced centrally. The right jugular vein was accessed 2 additional times utilizing continuous ultrasound guidance a 21 gauge needle, however the Nitrex wire could not be advanced centrally. Decision was made to place a left-sided central venous catheter. Left neck skin prepped and draped in usual fashion. Ultrasound image documenting patency of the left internal jugular vein was obtained and placed in permanent medical record. Sterile ultrasound probe cover and gel utilized throughout the procedure. Utilizing continuous ultrasound guidance, the left internal jugular vein was accessed with a 21 gauge needle. 21 gauge needle exchanged for a transitional dilator set over 0.018 inch guidewire. 0.035 inch guidewire  advanced through the 5 French dilator to the level of the inferior vena cava. Tract dilation was performed and triple-lumen non tunneled central venous catheter was inserted over the guidewire. The tip was positioned at the cavoatrial junction. All lumens aspirated and flushed well. The catheter was secured to skin with suture and covered with sterile dressing. IMPRESSION: 1. Left IJ non tunneled triple-lumen central venous catheter is ready for use. 2. 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 Electronically Signed   By: Miachel Roux M.D.   On: 08/17/2022 15:16   IR US Guide Vasc Access Left  Result Date: 08/17/2022 INDICATION: 76 year old woman with sepsis and poor venous access presents to IR for central line placement. EXAM: 1. Ultrasound-guided access of right internal jugular vein. 2. Ultrasound-guided access of left internal jugular vein. 3. Left internal jugular vein non tunneled triple-lumen central line placement. MEDICATIONS: None ANESTHESIA/SEDATION: None FLUOROSCOPY: Radiation Exposure Index (as provided by the fluoroscopic device): 84 mGy Kerma COMPLICATIONS: None immediate. PROCEDURE: Informed written consent was obtained from the patient after a thorough discussion of the procedural risks, benefits and alternatives. All questions were addressed. Maximal Sterile Barrier Technique was utilized including caps, mask, sterile gowns, sterile gloves, sterile drape, hand hygiene and skin antiseptic. A timeout was performed prior to the initiation of the procedure. Ultrasound image documenting patency of the right internal jugular vein was obtained and placed in permanent medical record. Sterile ultrasound probe cover and gel utilized throughout the procedure. Utilizing continuous ultrasound guidance, the right internal jugular vein was accessed with a 21 gauge needle. 0.018 inch Nitrex guidewire could not  be successfully advanced centrally. The right jugular vein was accessed 2 additional times utilizing continuous ultrasound guidance a 21 gauge needle, however the Nitrex wire could not be advanced  centrally. Decision was made to place a left-sided central venous catheter. Left neck skin prepped and draped in usual fashion. Ultrasound image documenting patency of the left internal jugular vein was obtained and placed in permanent medical record. Sterile ultrasound probe cover and gel utilized throughout the procedure. Utilizing continuous ultrasound guidance, the left internal jugular vein was accessed with a 21 gauge needle. 21 gauge needle exchanged for a transitional dilator set over 0.018 inch guidewire. 0.035 inch guidewire advanced through the 5 French dilator to the level of the inferior vena cava. Tract dilation was performed and triple-lumen non tunneled central venous catheter was inserted over the guidewire. The tip was positioned at the cavoatrial junction. All lumens aspirated and flushed well. The catheter was secured to skin with suture and covered with sterile dressing. IMPRESSION: 1. Left IJ non tunneled triple-lumen central venous catheter is ready for use. 2. 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 Electronically Signed   By: Miachel Roux M.D.   On: 08/17/2022 15:16   IR US Guide Vasc Access Right  Result Date: 08/17/2022 INDICATION: 76 year old woman with sepsis and poor venous access presents to IR for central line placement. EXAM: 1. Ultrasound-guided access of right internal jugular vein. 2. Ultrasound-guided access of left internal jugular vein. 3. Left internal jugular vein non tunneled triple-lumen central line placement. MEDICATIONS: None ANESTHESIA/SEDATION: None FLUOROSCOPY: Radiation Exposure Index (as provided by the fluoroscopic device): 84 mGy Kerma COMPLICATIONS: None immediate.  PROCEDURE: Informed written consent was obtained from the patient after a thorough discussion of the procedural risks, benefits and alternatives. All questions were addressed. Maximal Sterile Barrier Technique was utilized including caps, mask, sterile gowns, sterile gloves, sterile drape, hand hygiene and skin antiseptic. A timeout was performed prior to the initiation of the procedure. Ultrasound image documenting patency of the right internal jugular vein was obtained and placed in permanent medical record. Sterile ultrasound probe cover and gel utilized throughout the procedure. Utilizing continuous ultrasound guidance, the right internal jugular vein was accessed with a 21 gauge needle. 0.018 inch Nitrex guidewire could not be successfully advanced centrally. The right jugular vein was accessed 2 additional times utilizing continuous ultrasound guidance a 21 gauge needle, however the Nitrex wire could not be advanced centrally. Decision was made to place a left-sided central venous catheter. Left neck skin prepped and draped in usual fashion. Ultrasound image documenting patency of the left internal jugular vein was obtained and placed in permanent medical record. Sterile ultrasound probe cover and gel utilized throughout the procedure. Utilizing continuous ultrasound guidance, the left internal jugular vein was accessed with a 21 gauge needle. 21 gauge needle exchanged for a transitional dilator set over 0.018 inch guidewire. 0.035 inch guidewire advanced through the 5 French dilator to the level of the inferior vena cava. Tract dilation was performed and triple-lumen non tunneled central venous catheter was inserted over the guidewire. The tip was positioned at the cavoatrial junction. All lumens aspirated and flushed well. The catheter was secured to skin with suture and covered with sterile dressing. IMPRESSION: 1. Left IJ non tunneled triple-lumen central venous catheter is ready for use. 2. 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 Electronically Signed   By: Miachel Roux M.D.   On: 08/17/2022 15:16   [place] EKG SITE RITE  Result Date: 08/17/2022  If [employer] not attached, placement could not be confirmed due to current cardiac rhythm.  DG Chest Portable 1 View  Result Date: 08/17/2022 CLINICAL DATA:  76 year old female status post fall last night. Leukocytosis. EXAM: PORTABLE CHEST 1 VIEW COMPARISON:  CT Chest, Abdomen, and Pelvis today are reported separately. 07/27/2022. FINDINGS: Portable AP upright view at 0541 hours. Lung volumes and mediastinal contours are within normal limits. Allowing for portable technique the lungs are clear. No pneumothorax or pleural effusion identified. No acute osseous abnormality identified. Left mastectomy. Negative visible bowel gas. Visualized tracheal air column is within normal limits. IMPRESSION: No acute cardiopulmonary abnormality or acute traumatic injury identified. Electronically Signed   By: Genevie Ann M.D.   On: 08/17/2022 06:07   CT Head Wo Contrast  Result Date: 08/17/2022 CLINICAL DATA:  Fall EXAM: CT HEAD WITHOUT CONTRAST CT CERVICAL SPINE WITHOUT CONTRAST TECHNIQUE: Multidetector CT imaging of the head and cervical spine was performed following the standard protocol without intravenous contrast. Multiplanar CT image reconstructions of the cervical spine were also generated. RADIATION DOSE REDUCTION: This exam was performed according to the departmental dose-optimization program which includes automated exposure control, adjustment of the mA and/or kV according to patient size and/or use of iterative reconstruction technique. COMPARISON:  CT head dated 07/26/2022. CT head/cervical spine dated 06/28/2022. FINDINGS: CT HEAD FINDINGS Brain: No evidence of acute infarction, hemorrhage, hydrocephalus, extra-axial collection or mass lesion/mass effect.  Subcortical white matter and periventricular small vessel ischemic changes. Vascular: Mild intracranial atherosclerosis. Skull: Normal. Negative for fracture or focal lesion. Sinuses/Orbits: The visualized paranasal sinuses are essentially clear. The mastoid air cells are unopacified. Other: None. CT CERVICAL SPINE FINDINGS Alignment: Normal cervical lordosis. Skull base and vertebrae: No acute fracture. No primary bone lesion or focal pathologic process. Soft tissues and spinal canal: No prevertebral fluid or swelling. No visible canal hematoma. Disc levels: Moderate degenerative changes of the mid cervical spine. Spinal canal is patent. Upper chest: Visualized lung apices are clear. Other: Visualized thyroid is unremarkable. IMPRESSION: No evidence of acute intracranial abnormality. Small vessel ischemic changes. No evidence of traumatic injury to the cervical spine. Moderate degenerative changes. Electronically Signed   By: Julian Hy M.D.   On: 08/17/2022 03:22   CT Cervical Spine Wo Contrast  Result Date: 08/17/2022 CLINICAL DATA:  Fall EXAM: CT HEAD WITHOUT CONTRAST CT CERVICAL SPINE WITHOUT CONTRAST TECHNIQUE: Multidetector CT imaging of the head and cervical spine was performed following the standard protocol without intravenous contrast. Multiplanar CT image reconstructions of the cervical spine were also generated. RADIATION DOSE REDUCTION: This exam was performed according to the departmental dose-optimization program which includes automated exposure control, adjustment of the mA and/or kV according to patient size and/or use of iterative reconstruction technique. COMPARISON:  CT head dated 07/26/2022. CT head/cervical spine dated 06/28/2022. FINDINGS: CT HEAD FINDINGS Brain: No evidence of acute infarction, hemorrhage, hydrocephalus, extra-axial collection or mass lesion/mass effect. Subcortical white matter and periventricular small vessel ischemic changes. Vascular: Mild intracranial  atherosclerosis. Skull: Normal. Negative for fracture or focal lesion. Sinuses/Orbits: The visualized paranasal sinuses are essentially clear. The mastoid air cells are unopacified. Other: None. CT CERVICAL SPINE FINDINGS Alignment: Normal cervical lordosis. Skull base and vertebrae: No acute fracture. No primary bone lesion or focal pathologic process. Soft tissues and spinal canal: No prevertebral fluid or swelling. No visible canal hematoma. Disc levels: Moderate degenerative changes of the mid cervical spine. Spinal canal is patent. Upper chest: Visualized lung apices are clear. Other: Visualized thyroid  is unremarkable. IMPRESSION: No evidence of acute intracranial abnormality. Small vessel ischemic changes. No evidence of traumatic injury to the cervical spine. Moderate degenerative changes. Electronically Signed   By: Julian Hy M.D.   On: 08/17/2022 03:22   DG Knee Complete 4 Views Left  Result Date: 08/17/2022 CLINICAL DATA:  Left knee pain/injury EXAM: LEFT KNEE - COMPLETE 4+ VIEW COMPARISON:  None Available. FINDINGS: No fracture or dislocation is seen. Mild tricompartmental degenerative changes, most prominent in the patellofemoral compartment. Lateral compartment chondrocalcinosis. No suprapatellar knee joint effusion. IMPRESSION: No fracture or dislocation is seen. Mild degenerative changes with chondrocalcinosis. Electronically Signed   By: Julian Hy M.D.   On: 08/17/2022 02:42      Assessment/Plan Principal Problem:   Hyponatremia Active Problems:   Acute renal failure superimposed on stage 3b chronic kidney disease (HCC)   UTI (urinary tract infection)   Chronic diastolic CHF (congestive heart failure) (HCC)   Elevated lactic acid level   HTN (hypertension)   Hypothyroidism   Iron deficiency anemia   Fall at home, initial encounter   Abnormal LFTs   HLD (hyperlipidemia)   History of TIA (transient ischemic attack)   Depression with anxiety   Assessment and  Plan: * Hyponatremia Sodium 125.  Mental status okay.  -Admitted to telemetry bed as inpatient -IV fluid: 2.5 L normal saline, then 100 cc/h -Fluid restriction -Check osmolality of plasma and urine, urine sodium level -Sodium chloride tablet 1 g twice daily -Follow-up BMP q 8-hour  Acute renal failure superimposed on stage 3b chronic kidney disease ([place]) Recent baseline creatinine 0.98 on 07/30/1972.  Her creatinine is 1.65, BUN 26.  Likely due to dehydration.  Patient has possible UTI, which may have contributed partially -Hold Micardis -IV fluid as above -Follow-up by BMP -Antibiotics for UTI as below  UTI (urinary tract infection) Patient has possible UTI.  Patient has typical symptoms of UTI.  Urinalysis showed hazy appearance, small amount of leukocyte, WBC 6-10.  Patient received 1 dose of Rocephin and cefepime in ED. -IV Rocephin -Follow-up urine culture  Chronic diastolic CHF (congestive heart failure) ([place]) 2D echo 07/29/2021 showed EF of 55-60% with grade 2 diastolic dysfunction.  Patient does not have leg edema or JVD.  CHF is compensated.  No pulm edema chest x-ray. -Check BNP -Watch volume status closely  Elevated lactic acid level Lactic acid 3.0, does not meets criteria for sepsis. -IV fluid as above -Trend lactic acid level  HTN (hypertension) IV hydralazine as needed -Hold Micardis due to worsening renal function and hyponatremia -Continue home amlodipine, clonidine, metoprolol,  Hypothyroidism - Synthroid  Iron deficiency anemia Hemoglobin stable at 12.9. -Continue ferrous sulfate  Fall at home, initial encounter Fall and questionable syncope: CT head negative.  CT of C-spine negative.  No focal neurodeficit on physical examination. -For precaution -PT/OT -Check orthostatic vital sign  Abnormal LFTs Mild abnormal liver function.  No abdominal pain. -Check hepatitis panel  HLD (hyperlipidemia) - Lipitor  History of TIA (transient ischemic  attack) - Aspirin, Lipitor  Depression with anxiety - Continue home medications          DVT ppx: SQ Lovenox  Code Status: DNR per her son  Family Communication: Yes, patient's son by phone  Disposition Plan:  Anticipate discharge back to previous environment  Consults called:  none  Admission status and Level of care: Telemetry Medical:  as inpt       Dispo: The patient is from: Home  Anticipated d/c is to: Home              Anticipated d/c date is: 2 days              Patient currently is not medically stable to d/c.    Severity of Illness:  The appropriate patient status for this patient is INPATIENT. Inpatient status is judged to be reasonable and necessary in order to provide the required intensity of service to ensure the patient's safety. The patient's presenting symptoms, physical exam findings, and initial radiographic and laboratory data in the context of their chronic comorbidities is felt to place them at high risk for further clinical deterioration. Furthermore, it is not anticipated that the patient will be medically stable for discharge from the hospital within 2 midnights of admission.   * I certify that at the point of admission it is my clinical judgment that the patient will require inpatient hospital care spanning beyond 2 midnights from the point of admission due to high intensity of service, high risk for further deterioration and high frequency of surveillance required.*       Date of Service 08/17/2022    Ivor [facility]   If 7PM-7AM, please contact night-coverage [email or web address] 08/17/2022, 7:28 PM

## 2022-08-17 NOTE — Assessment & Plan Note (Signed)
Recent baseline creatinine 0.98 on 07/30/1972.  Her creatinine is 1.65, BUN 26.  Likely due to dehydration.  Patient has possible UTI, which may have contributed partially -Hold Micardis -IV fluid as above -Follow-up by BMP -Antibiotics for UTI as below

## 2022-08-17 NOTE — Assessment & Plan Note (Signed)
Synthroid 

## 2022-08-17 NOTE — ED Notes (Signed)
Pt reports to radiology that she fell and hit her head; pt is c/o dizziness since yesterday and occipital HA that began after fall; son reports that she actually had unwitnessed fall yesterday and tonight pt was standing with walker "and seemed to pass out" but he caught her by her arm before she hit the ground

## 2022-08-17 NOTE — Assessment & Plan Note (Signed)
IV hydralazine as needed -Hold Micardis due to worsening renal function and hyponatremia -Continue home amlodipine, clonidine, metoprolol,

## 2022-08-17 NOTE — Assessment & Plan Note (Signed)
-   Aspirin, Lipitor

## 2022-08-17 NOTE — Progress Notes (Signed)
VAST RN at bedside to assess for PIV placement. Left arm restricted.   Right arm assessed using ultrasound. Previous US guided IV infiltrated to right upper arm brachial vein, significant swelling noted. No cephalic vein visualized. Basilic vein visualized, and compressible. Basilic vein used successfully to place PIV above swelling from previous brachial IV as it is only viable option at this time. Due to antibiotic orders, not a candidate for midline at this time.   If PIV that was just placed infiltrates, recommend temp CVC at that time as too much swelling for PICC currently and no further options for peripheral access at this time.

## 2022-08-17 NOTE — Assessment & Plan Note (Signed)
Hemoglobin stable at 12.9. -Continue ferrous sulfate

## 2022-08-17 NOTE — Assessment & Plan Note (Signed)
Fall and questionable syncope: CT head negative.  CT of C-spine negative.  No focal neurodeficit on physical examination. -For precaution -PT/OT -Check orthostatic vital sign

## 2022-08-17 NOTE — Assessment & Plan Note (Signed)
Lactic acid 3.0, does not meets criteria for sepsis. -IV fluid as above -Trend lactic acid level

## 2022-08-17 NOTE — Assessment & Plan Note (Signed)
Sodium 125.  Mental status okay.  -Admitted to telemetry bed as inpatient -IV fluid: 2.5 L normal saline, then 100 cc/h -Fluid restriction -Check osmolality of plasma and urine, urine sodium level -Sodium chloride tablet 1 g twice daily -Follow-up BMP q 8-hour

## 2022-08-17 NOTE — Plan of Care (Signed)
Patient has had x2 IVs ( Upper RT arm) placed with [place] via IV team (0630 and 1030).  Both infiltrated during needed infusion.  Dr informed and is consulting IR - Based on IV Team's recommendations.    Patient also has had recent mastectomy  - so cannot use LF arm to find other options.

## 2022-08-17 NOTE — Assessment & Plan Note (Signed)
2D echo 07/29/2021 showed EF of 55-60% with grade 2 diastolic dysfunction.  Patient does not have leg edema or JVD.  CHF is compensated.  No pulm edema chest x-ray. -Check BNP -Watch volume status closely

## 2022-08-17 NOTE — ED Provider Notes (Signed)
[facility] Provider Note    Event Date/Time   First MD Initiated Contact with Patient 08/17/22 [phone]     (approximate)   History   Fall   HPI  Cindy Huerta is a 76 y.o. female with history of hypertension, hyperlipidemia, hypothyroidism who lives at home with her family who states that she got dizzy and fell injuring her left knee yesterday.  He describes having vertigo but is unable to describe it further.  No chest pain or shortness of breath.  Reports she has had some dysuria but no hematuria.  No vomiting or diarrhea.  She denies any poor p.o. intake but there is no family at bedside to confirm.   History provided by patient.    Past Medical History:  Diagnosis Date   Arthritis    Breast cancer ([place])    Depression    GERD (gastroesophageal reflux disease)    HLD (hyperlipidemia)    Hypertension    Hypothyroidism     Past Surgical History:  Procedure Laterality Date   ABDOMINAL HYSTERECTOMY  1982   AXILLARY SENTINEL NODE BIOPSY Left 11/20/2021   Procedure: AXILLARY SENTINEL NODE BIOPSY;  Surgeon: Benjamine Sprague, DO;  Location: [facility] ORS;  Service: General;  Laterality: Left;   BREAST BIOPSY Left 09/23/2021   3:00 13cmfn venus marker, path pending   BREAST BIOPSY Left 09/23/2021   3:00 14 cmfn heart marker, path pending   CHOLECYSTECTOMY     ESOPHAGOGASTRODUODENOSCOPY (EGD) WITH PROPOFOL N/A 10/08/2021   Procedure: ESOPHAGOGASTRODUODENOSCOPY (EGD) WITH PROPOFOL;  Surgeon: Jonathon Bellows, MD;  Location: [facility] ENDOSCOPY;  Service: Gastroenterology;  Laterality: N/A;   HEMATOMA EVACUATION Left 11/21/2021   Procedure: EVACUATION HEMATOMA;  Surgeon: Benjamine Sprague, DO;  Location: [facility] ORS;  Service: General;  Laterality: Left;   left lumpectomy  2019   TOTAL MASTECTOMY Left 11/20/2021   Procedure: TOTAL MASTECTOMY;  Surgeon: Benjamine Sprague, DO;  Location: [facility] ORS;  Service: General;  Laterality: Left;    MEDICATIONS:  Prior to Admission medications    Medication Sig Start Date End Date Taking? Authorizing Provider  acetaminophen (TYLENOL) 500 MG tablet Take 500-1,000 mg by mouth every 6 (six) hours as needed for moderate pain.    [provider]  alum & mag hydroxide-simeth (MAALOX/MYLANTA) 200-200-20 MG/5ML suspension Take 30 mLs by mouth every 6 (six) hours as needed for indigestion or heartburn.    [provider]  amLODipine (NORVASC) 5 MG tablet Take 1 tablet (5 mg total) by mouth daily. 07/30/22   Lavina Hamman, MD  ARTIFICIAL TEARS 0.1-0.3 % SOLN Place 1 drop into both eyes daily as needed for dry eyes. 07/15/22   [provider]  aspirin 81 MG EC tablet Take 1 tablet (81 mg total) by mouth daily. RESTART 48HRS AFTER DISCHARGE Patient not taking: Reported on 07/27/2022 11/25/21   Benjamine Sprague, DO  atorvastatin (LIPITOR) 80 MG tablet Take 80 mg by mouth daily. Patient not taking: Reported on 07/27/2022 09/21/21   [provider]  BIOFREEZE 4 % GEL Apply 1 application  topically 2 (two) times daily. 07/15/22   [provider]  bisacodyl (DULCOLAX) 10 MG suppository Place 1 suppository (10 mg total) rectally daily at 12 noon. 07/30/22   Lavina Hamman, MD  Cholecalciferol (VITAMIN D-3 PO) Take 1 capsule by mouth daily.    [provider]  cloNIDine (CATAPRES) 0.1 MG tablet Take 0.1 mg by mouth 2 (two) times daily. 05/16/22   [provider]  docusate sodium (COLACE) 100 MG capsule Take 1 capsule (100 mg total) by mouth 2 (two) times daily. 07/01/22   Danford, Suann Larry, MD  Ferrous Sulfate (IRON PO) Take 1 tablet by mouth daily.    [provider]  FLUoxetine (PROZAC) 20 MG capsule Take 20 mg by mouth daily.    [provider]  fluticasone (FLONASE) 50 MCG/ACT nasal spray Place 1 spray into both nostrils daily as needed for allergies or rhinitis.    [provider]  levothyroxine (SYNTHROID) 75 MCG tablet Take 75 mcg by mouth daily before breakfast.  02/10/21   [provider]  LORazepam (ATIVAN) 0.5 MG tablet Take 1 tablet (0.5 mg total) by mouth daily. 07/01/22   Danford, Suann Larry, MD  magnesium oxide (MAG-OX) 400 MG tablet Take 1 tablet (400 mg total) by mouth daily. 07/30/22   Lavina Hamman, MD  meclizine (ANTIVERT) 12.5 MG tablet Take 12.5 mg by mouth 3 (three) times daily as needed for dizziness.    [provider]  metoprolol succinate (TOPROL-XL) 50 MG 24 hr tablet Take 50 mg by mouth 2 (two) times daily. 03/18/21   [provider]  Multiple Vitamins-Minerals (THERA-M) TABS Take 1 tablet by mouth daily.    [provider]  Nutritional Supplements (ENSURE NUTRITION SHAKE) LIQD Take 1 Bottle by mouth 2 (two) times daily as needed (loss of appetite).    [provider]  ondansetron (ZOFRAN-ODT) 4 MG disintegrating tablet Take 1 tablet (4 mg total) by mouth every 8 (eight) hours as needed for nausea or vomiting. 07/30/22   Lavina Hamman, MD  pantoprazole (PROTONIX) 40 MG tablet Take 1 tablet (40 mg total) by mouth daily. 10/10/21 09/11/22  Ralene Muskrat B, MD  polyethylene glycol powder (GLYCOLAX/MIRALAX) 17 GM/SCOOP powder Take 1 capful (17 g) with water by mouth daily. 07/30/22   Lavina Hamman, MD  potassium chloride SA (KLOR-CON) 20 MEQ tablet Take 20 mEq by mouth daily.    [provider]  telmisartan (MICARDIS) 80 MG tablet Take 1 tablet (80 mg total) by mouth daily. 07/30/22   Lavina Hamman, MD  traMADol (ULTRAM) 50 MG tablet Take 1 tablet (50 mg total) by mouth every 6 (six) hours as needed for up to 10 doses for moderate pain. 07/01/22   Danford, Suann Larry, MD  trolamine salicylate (ASPERCREME) 10 % cream Apply 1 application topically as needed for muscle pain.    [provider]  vitamin B-12 (CYANOCOBALAMIN) 100 MCG tablet Take 100 mg by mouth 2 (two) times daily. Patient not taking: Reported on 07/27/2022    [provider]    Physical Exam    Triage Vital Signs: ED Triage Vitals [08/17/22 0152]  Enc Vitals Group     BP 126/75     Pulse Rate 73     Resp 18     Temp 97.9 F (36.6 C)     Temp Source Oral     SpO2 100 %     Weight      Height      Head Circumference      Peak Flow      Pain Score      Pain Loc      Pain Edu?      Excl. in [place]?     Most recent vital signs: Vitals:   08/17/22 0635 08/17/22 0645  BP:    Pulse: 63   Resp: 19   Temp:  98.5 F (36.9  C)  SpO2: 100%     CONSTITUTIONAL: Alert and oriented and responds appropriately to questions.  Chronically ill-appearing, elderly, appears in pain HEAD: Normocephalic, atraumatic EYES: Conjunctivae clear, pupils appear equal, sclera nonicteric ENT: normal nose; dry appearing mucous membranes NECK: Supple, normal ROM CARD: RRR; S1 and S2 appreciated; no murmurs, no clicks, no rubs, no gallops RESP: Normal chest excursion without splinting or tachypnea; breath sounds clear and equal bilaterally; no wheezes, no rhonchi, no rales, no hypoxia or respiratory distress, speaking full sentences ABD/GI: Normal bowel sounds; non-distended; soft, non-tender, no rebound, no guarding, no peritoneal signs BACK: The back appears normal EXT: Patient has some bruising to the right anterior knee but no deformity and no tenderness over this joint.  She is quite tender however diffusely over the left knee with a small amount of bruising but no joint effusion.  Compartments are soft in the legs.  Extremities warm well perfused.  She has difficulty with range of motion of the left knee due to pain. SKIN: Normal color for age and race; warm; no rash on exposed skin NEURO: Moves all extremities equally, normal speech, very slow to answer questions, no facial asymmetry PSYCH: The patient's mood and manner are appropriate.   ED Results / Procedures / Treatments   LABS: (all labs ordered are listed, but only abnormal results are displayed) Labs Reviewed  URINALYSIS, ROUTINE W  REFLEX MICROSCOPIC - Abnormal; Notable for the following components:      Result Value   Color, Urine YELLOW (*)    APPearance HAZY (*)    Protein, ur 30 (*)    Leukocytes,Ua SMALL (*)    All other components within normal limits  COMPREHENSIVE METABOLIC PANEL - Abnormal; Notable for the following components:   Sodium 125 (*)    Chloride 89 (*)    CO2 17 (*)    Glucose, Bld 101 (*)    BUN 26 (*)    Creatinine, Ser 1.65 (*)    Total Protein 9.2 (*)    AST 51 (*)    GFR, Estimated 32 (*)    Anion gap 19 (*)    All other components within normal limits  CBC WITH DIFFERENTIAL/PLATELET - Abnormal; Notable for the following components:   WBC 16.8 (*)    Neutro Abs 13.7 (*)    Monocytes Absolute 1.5 (*)    Abs Immature Granulocytes 0.15 (*)    All other components within normal limits  LACTIC ACID, PLASMA - Abnormal; Notable for the following components:   Lactic Acid, Venous 3.0 (*)    All other components within normal limits  TROPONIN I (HIGH SENSITIVITY) - Abnormal; Notable for the following components:   Troponin I (High Sensitivity) 18 (*)    All other components within normal limits  SARS CORONAVIRUS 2 BY RT PCR  URINE CULTURE  CULTURE, BLOOD (ROUTINE X 2)  CULTURE, BLOOD (ROUTINE X 2)  PROCALCITONIN  CBC WITH DIFFERENTIAL/PLATELET  LACTIC ACID, PLASMA  TROPONIN I (HIGH SENSITIVITY)     EKG:  EKG Interpretation  Date/Time:  Tuesday August 17 2022 02:57:30 EDT Ventricular Rate:  71 PR Interval:  126 QRS Duration: 78 QT Interval:  442 QTC Calculation: 480 R Axis:   -10 Text Interpretation: Normal sinus rhythm with sinus arrhythmia Cannot rule out Anterior infarct , age undetermined Abnormal ECG When compared with ECG of 26-Jul-2022 17:28, Questionable change in QRS axis Confirmed by Damante Spragg, Cyril Mourning [phone]) on 08/17/2022 6:59:24 AM  RADIOLOGY: My personal review and interpretation of imaging: Chest x-ray clear.  CT head and cervical spine show no  traumatic injury.  X-ray of the left knee shows no fracture, dislocation.  I have personally reviewed all radiology reports.   DG Chest Portable 1 View  Result Date: 08/17/2022 CLINICAL DATA:  76 year old female status post fall last night. Leukocytosis. EXAM: PORTABLE CHEST 1 VIEW COMPARISON:  CT Chest, Abdomen, and Pelvis today are reported separately. 07/27/2022. FINDINGS: Portable AP upright view at 0541 hours. Lung volumes and mediastinal contours are within normal limits. Allowing for portable technique the lungs are clear. No pneumothorax or pleural effusion identified. No acute osseous abnormality identified. Left mastectomy. Negative visible bowel gas. Visualized tracheal air column is within normal limits. IMPRESSION: No acute cardiopulmonary abnormality or acute traumatic injury identified. Electronically Signed   By: Genevie Ann M.D.   On: 08/17/2022 06:07   CT Head Wo Contrast  Result Date: 08/17/2022 CLINICAL DATA:  Fall EXAM: CT HEAD WITHOUT CONTRAST CT CERVICAL SPINE WITHOUT CONTRAST TECHNIQUE: Multidetector CT imaging of the head and cervical spine was performed following the standard protocol without intravenous contrast. Multiplanar CT image reconstructions of the cervical spine were also generated. RADIATION DOSE REDUCTION: This exam was performed according to the departmental dose-optimization program which includes automated exposure control, adjustment of the mA and/or kV according to patient size and/or use of iterative reconstruction technique. COMPARISON:  CT head dated 07/26/2022. CT head/cervical spine dated 06/28/2022. FINDINGS: CT HEAD FINDINGS Brain: No evidence of acute infarction, hemorrhage, hydrocephalus, extra-axial collection or mass lesion/mass effect. Subcortical white matter and periventricular small vessel ischemic changes. Vascular: Mild intracranial atherosclerosis. Skull: Normal. Negative for fracture or focal lesion. Sinuses/Orbits: The visualized paranasal sinuses  are essentially clear. The mastoid air cells are unopacified. Other: None. CT CERVICAL SPINE FINDINGS Alignment: Normal cervical lordosis. Skull base and vertebrae: No acute fracture. No primary bone lesion or focal pathologic process. Soft tissues and spinal canal: No prevertebral fluid or swelling. No visible canal hematoma. Disc levels: Moderate degenerative changes of the mid cervical spine. Spinal canal is patent. Upper chest: Visualized lung apices are clear. Other: Visualized thyroid is unremarkable. IMPRESSION: No evidence of acute intracranial abnormality. Small vessel ischemic changes. No evidence of traumatic injury to the cervical spine. Moderate degenerative changes. Electronically Signed   By: Julian Hy M.D.   On: 08/17/2022 03:22   CT Cervical Spine Wo Contrast  Result Date: 08/17/2022 CLINICAL DATA:  Fall EXAM: CT HEAD WITHOUT CONTRAST CT CERVICAL SPINE WITHOUT CONTRAST TECHNIQUE: Multidetector CT imaging of the head and cervical spine was performed following the standard protocol without intravenous contrast. Multiplanar CT image reconstructions of the cervical spine were also generated. RADIATION DOSE REDUCTION: This exam was performed according to the departmental dose-optimization program which includes automated exposure control, adjustment of the mA and/or kV according to patient size and/or use of iterative reconstruction technique. COMPARISON:  CT head dated 07/26/2022. CT head/cervical spine dated 06/28/2022. FINDINGS: CT HEAD FINDINGS Brain: No evidence of acute infarction, hemorrhage, hydrocephalus, extra-axial collection or mass lesion/mass effect. Subcortical white matter and periventricular small vessel ischemic changes. Vascular: Mild intracranial atherosclerosis. Skull: Normal. Negative for fracture or focal lesion. Sinuses/Orbits: The visualized paranasal sinuses are essentially clear. The mastoid air cells are unopacified. Other: None. CT CERVICAL SPINE FINDINGS  Alignment: Normal cervical lordosis. Skull base and vertebrae: No acute fracture. No primary bone lesion or focal pathologic process. Soft tissues and spinal canal: No prevertebral fluid or swelling. No visible canal  hematoma. Disc levels: Moderate degenerative changes of the mid cervical spine. Spinal canal is patent. Upper chest: Visualized lung apices are clear. Other: Visualized thyroid is unremarkable. IMPRESSION: No evidence of acute intracranial abnormality. Small vessel ischemic changes. No evidence of traumatic injury to the cervical spine. Moderate degenerative changes. Electronically Signed   By: Julian Hy M.D.   On: 08/17/2022 03:22   DG Knee Complete 4 Views Left  Result Date: 08/17/2022 CLINICAL DATA:  Left knee pain/injury EXAM: LEFT KNEE - COMPLETE 4+ VIEW COMPARISON:  None Available. FINDINGS: No fracture or dislocation is seen. Mild tricompartmental degenerative changes, most prominent in the patellofemoral compartment. Lateral compartment chondrocalcinosis. No suprapatellar knee joint effusion. IMPRESSION: No fracture or dislocation is seen. Mild degenerative changes with chondrocalcinosis. Electronically Signed   By: Julian Hy M.D.   On: 08/17/2022 02:42     PROCEDURES:  Critical Care performed: YES  CRITICAL CARE Performed by: Cyril Mourning Deshon Hsiao   Total critical care time: 45 minutes  Critical care time was exclusive of separately billable procedures and treating other patients.  Critical care was necessary to treat or prevent imminent or life-threatening deterioration.  Critical care was time spent personally by me on the following activities: development of treatment plan with patient and/or surrogate as well as nursing, discussions with consultants, evaluation of patient's response to treatment, examination of patient, obtaining history from patient or surrogate, ordering and performing treatments and interventions, ordering and review of laboratory studies,  ordering and review of radiographic studies, pulse oximetry and re-evaluation of patient's condition.       Marland Kitchen1-3 Lead EKG Interpretation  Performed by: Kelby Adell, Delice Bison, DO Authorized by: Quaniyah Bugh, Delice Bison, DO     Interpretation: normal     ECG rate:  63   ECG rate assessment: normal     Rhythm: sinus rhythm     Ectopy: none     Conduction: normal       IMPRESSION / MDM / ASSESSMENT AND PLAN / ED COURSE  I reviewed the triage vital signs and the nursing notes.    Here after fall from home.  States she felt dizzy which she describes as "vertigo".  Complaining of left knee pain.  The patient is on the cardiac monitor to evaluate for evidence of arrhythmia and/or significant heart rate changes.   DIFFERENTIAL DIAGNOSIS (includes but not limited to):   BPPV, CVA, dehydration, anemia, electrolyte derangement, UTI, ACS, arrhythmia, knee contusion, knee fracture, doubt dislocation   Patient's presentation is most consistent with acute presentation with potential threat to life or bodily function.   PLAN: Patient's labs obtained from triage.  Leukocytosis of 16,000 with left shift but she denies any infectious symptoms other than some urinary discomfort.  We will add on a procalcitonin.  Rectal temp is 98.5.  Sodium has come back low at 125 and she has a metabolic acidosis but normal glucose.  Will check lactate to ensure not a lactic acidosis but this could be from mild uremia as she also has an AKI with creatinine of 1.65 up from 0.98 in the end of August.  We will give IV fluids.  First troponin is elevated at 18.  Will repeat.  Urine shows no sign of infection other than some pyuria but no bacteria.  We will add on culture.  Chest x-ray reviewed and interpreted by myself and the radiologist and shows no acute abnormality.  CT head and cervical spine reviewed and interpreted by myself and the radiologist and show no  acute abnormality.  X-ray of the left knee is also negative.  We will  give pain medication here and hydrate patient.  Will discuss with hospitalist for admission for dehydration.   MEDICATIONS GIVEN IN ED: Medications  0.9 %  sodium chloride infusion (has no administration in time range)  sodium chloride 0.9 % bolus 1,000 mL (has no administration in time range)  sodium chloride 0.9 % bolus 1,000 mL (has no administration in time range)  sodium chloride 0.9 % bolus 500 mL (500 mLs Intravenous New Bag/Given 08/17/22 0618)  fentaNYL (SUBLIMAZE) injection 50 mcg (50 mcg Intravenous Given 08/17/22 0619)  ondansetron (ZOFRAN) injection 4 mg (4 mg Intravenous Given 08/17/22 0618)     ED COURSE: Patient's second troponin is 17.  COVID-negative.  Lactic has come back at 3.0.  This could be from dehydration but sepsis is on the differential.  Her procalcitonin has come back elevated at 0.34.  We will give 30 mL/kg IV fluid bolus based on her body weight and broad-spectrum antibiotics.  Cultures for urine and blood are pending.  She is complaining of left knee pain from the fall.  There is no sign of any septic arthritis or cellulitis on exam.   CONSULTS:  Consulted and discussed patient's case with hospitalist, Dr. Sidney Ace.  I have recommended admission and consulting physician agrees and will place admission orders.  Patient (and family if present) agree with this plan.   I reviewed all nursing notes, vitals, pertinent previous records.  All labs, EKGs, imaging ordered have been independently reviewed and interpreted by myself.    OUTSIDE RECORDS REVIEWED: Reviewed patient's last internal medicine note with Dr. Providence Crosby on 08/11/2022.  She was seen at that time for dizziness and hypomagnesemia.  We will add on a magnesium level here.       FINAL CLINICAL IMPRESSION(S) / ED DIAGNOSES   Final diagnoses:  Hyponatremia  AKI (acute kidney injury) ([place])  Metabolic acidosis  SIRS (systemic inflammatory response syndrome) ([place])     Rx / DC Orders   ED Discharge  Orders     None        Note:  This document was prepared using Dragon voice recognition software and may include unintentional dictation errors.   Mayari Matus, Delice Bison, DO 08/17/22 [phone]

## 2022-08-17 NOTE — Assessment & Plan Note (Signed)
-   Continue home medications 

## 2022-08-17 NOTE — Progress Notes (Signed)
Chaplain responded to spiritual consult. Pt. was awake and welcoming to Chaplain's visit. Pt. Requested prayer and [place] engaged in uplifting conversation. Pt. shared her core beliefs in God and how He is helping her get through her time in the hospital. [facility] listened empathetically and offered a compassionate presence. Pt. was extremely grateful for prayer and Chaplain's visit. CH has made note for next Chaplain to follow up as requested by PT. Please contact Chaplain if needed in the meantime.

## 2022-08-17 NOTE — ED Triage Notes (Signed)
Pt arrived via [facility] from home where pt had an assisted fall last night at 1900. Per pts son, pt tripped and he was able to catch her but not before she hit her left knee on door frame. Pt to ED with c/o left knee pain. No obvious deformity noted.

## 2022-08-17 NOTE — Progress Notes (Signed)
PHARMACY -  BRIEF ANTIBIOTIC NOTE   Pharmacy has received consult(s) for vancomycin and cefepime from an ED provider.  The patient's profile has been reviewed for ht/wt/allergies/indication/available labs.    One time order(s) placed for   1) cefepime 2 grams IV x 1  2) vancomycin 1250 mg IV x 1  Further antibiotics/pharmacy consults should be ordered by admitting physician if indicated.                       Thank you, Dallie Piles 08/17/2022  7:57 AM

## 2022-08-17 NOTE — Assessment & Plan Note (Signed)
Patient has possible UTI.  Patient has typical symptoms of UTI.  Urinalysis showed hazy appearance, small amount of leukocyte, WBC 6-10.  Patient received 1 dose of Rocephin and cefepime in ED. -IV Rocephin -Follow-up urine culture

## 2022-08-17 NOTE — Procedures (Signed)
Interventional Radiology Procedure Note  Procedure: Triple lumen non-tunneled left IJV catheter  Indication: Sepsis  Findings:  Catheter tip at [facility]. Catheter is ready for use. Please refer to procedural dictation for full description.  Complications: None  EBL: < 10 mL  Miachel Roux, MD [phone]

## 2022-08-17 NOTE — Assessment & Plan Note (Signed)
Lipitor 

## 2022-08-17 NOTE — Assessment & Plan Note (Signed)
Mild abnormal liver function.  No abdominal pain. -Check hepatitis panel

## 2022-08-18 DIAGNOSIS — E871 Hypo-osmolality and hyponatremia: Secondary | ICD-10-CM | POA: Diagnosis not present

## 2022-08-18 LAB — BASIC METABOLIC PANEL
Anion gap: 7 (ref 5–15)
Anion gap: 8 (ref 5–15)
Anion gap: 8 (ref 5–15)
BUN: 28 mg/dL — ABNORMAL HIGH (ref 8–23)
BUN: 25 mg/dL — ABNORMAL HIGH (ref 8–23)
BUN: 21 mg/dL (ref 8–23)
CO2: 24 mmol/L (ref 22–32)
CO2: 23 mmol/L (ref 22–32)
CO2: 22 mmol/L (ref 22–32)
Calcium: 8.5 mg/dL — ABNORMAL LOW (ref 8.9–10.3)
Calcium: 8.4 mg/dL — ABNORMAL LOW (ref 8.9–10.3)
Calcium: 8.3 mg/dL — ABNORMAL LOW (ref 8.9–10.3)
Chloride: 93 mmol/L — ABNORMAL LOW (ref 98–111)
Chloride: 98 mmol/L (ref 98–111)
Chloride: 97 mmol/L — ABNORMAL LOW (ref 98–111)
Creatinine, Ser: 1.52 mg/dL — ABNORMAL HIGH (ref 0.44–1.00)
Creatinine, Ser: 1.29 mg/dL — ABNORMAL HIGH (ref 0.44–1.00)
Creatinine, Ser: 1.04 mg/dL — ABNORMAL HIGH (ref 0.44–1.00)
GFR, Estimated: 35 mL/min — ABNORMAL LOW (ref >60)
GFR, Estimated: 42 mL/min — ABNORMAL LOW (ref >60)
GFR, Estimated: 55 mL/min — ABNORMAL LOW (ref >60)
Glucose, Bld: 129 mg/dL — ABNORMAL HIGH (ref 70–99)
Glucose, Bld: 121 mg/dL — ABNORMAL HIGH (ref 70–99)
Glucose, Bld: 108 mg/dL — ABNORMAL HIGH (ref 70–99)
Potassium: 3.1 mmol/L — ABNORMAL LOW (ref 3.5–5.1)
Potassium: 3.2 mmol/L — ABNORMAL LOW (ref 3.5–5.1)
Potassium: 4.1 mmol/L (ref 3.5–5.1)
Sodium: 124 mmol/L — ABNORMAL LOW (ref 135–145)
Sodium: 129 mmol/L — ABNORMAL LOW (ref 135–145)
Sodium: 127 mmol/L — ABNORMAL LOW (ref 135–145)

## 2022-08-18 LAB — URINE CULTURE

## 2022-08-18 LAB — CBC
HCT: 29.9 % — ABNORMAL LOW (ref 36.0–46.0)
Hemoglobin: 10.2 g/dL — ABNORMAL LOW (ref 12.0–15.0)
MCH: 30.2 pg (ref 26.0–34.0)
MCHC: 34.1 g/dL (ref 30.0–36.0)
MCV: 88.5 fL (ref 80.0–100.0)
Platelets: 239 10*3/uL (ref 150–400)
RBC: 3.38 MIL/uL — ABNORMAL LOW (ref 3.87–5.11)
RDW: 13.5 % (ref 11.5–15.5)
WBC: 12.7 10*3/uL — ABNORMAL HIGH (ref 4.0–10.5)
nRBC: 0 % (ref 0.0–0.2)

## 2022-08-18 LAB — PHOSPHORUS: Phosphorus: 3.3 mg/dL (ref 2.5–4.6)

## 2022-08-18 LAB — HCV INTERPRETATION

## 2022-08-18 MED ORDER — HYDRALAZINE HCL 20 MG/ML IJ SOLN: 10.0000 mg | INTRAMUSCULAR | Status: DC | PRN

## 2022-08-18 MED ORDER — POTASSIUM CHLORIDE 20 MEQ PO PACK
40.0000 meq | PACK | Freq: Once | ORAL | Status: AC
  Administered 2022-08-18: 40 meq via ORAL
  Filled 2022-08-18: qty 2

## 2022-08-18 MED ORDER — TRAZODONE HCL 50 MG PO TABS
50.0000 mg | ORAL_TABLET | Freq: Every evening | ORAL | Status: DC | PRN
  Administered 2022-08-18 – 2022-08-23 (×4): 50 mg via ORAL
  Filled 2022-08-18 (×4): qty 1

## 2022-08-18 MED ORDER — POTASSIUM CHLORIDE 10 MEQ/100ML IV SOLN
10.0000 meq | INTRAVENOUS | Status: AC
  Administered 2022-08-18 (×4): 10 meq via INTRAVENOUS
  Filled 2022-08-18 (×4): qty 100

## 2022-08-18 MED ORDER — DICLOFENAC SODIUM 1 % EX GEL
4.0000 g | Freq: Four times a day (QID) | CUTANEOUS | Status: DC
  Administered 2022-08-18 – 2022-08-24 (×21): 4 g via TOPICAL
  Filled 2022-08-18: qty 100

## 2022-08-18 MED ORDER — METOPROLOL TARTRATE 5 MG/5ML IV SOLN: 5.0000 mg | INTRAVENOUS | Status: DC | PRN

## 2022-08-18 MED ORDER — GUAIFENESIN 100 MG/5ML PO LIQD
5.0000 mL | ORAL | Status: DC | PRN
  Administered 2022-08-21: 5 mL via ORAL
  Filled 2022-08-18: qty 10

## 2022-08-18 MED ORDER — IPRATROPIUM-ALBUTEROL 0.5-2.5 (3) MG/3ML IN SOLN: 3.0000 mL | RESPIRATORY_TRACT | Status: DC | PRN

## 2022-08-18 MED ORDER — ENOXAPARIN SODIUM 40 MG/0.4ML IJ SOSY
40.0000 mg | PREFILLED_SYRINGE | INTRAMUSCULAR | Status: DC
  Administered 2022-08-19 – 2022-08-24 (×6): 40 mg via SUBCUTANEOUS
  Filled 2022-08-18 (×6): qty 0.4

## 2022-08-18 NOTE — Progress Notes (Signed)
PROGRESS NOTE    Cindy Huerta  AVW:[id number] DOB: [birth year] DOA: 08/17/2022 PCP: Juluis Pitch, MD   Brief Narrative:   76 y.o. female with medical history significant of HTN, hyperlipidemia, TIA, GERD, hypertension, depression with anxiety, CKD-3b, GI bleeding, dCHF, hyponatremia, who presents with fall and dysuria.  In the ER patient was noted to be clinically dehydrated with hyponatremia.  CT head and cervical spine was negative.  X-ray of the left knee was also unremarkable.    Assessment & Plan:  Principal Problem:   Hyponatremia Active Problems:   Acute renal failure superimposed on stage 3b chronic kidney disease (HCC)   UTI (urinary tract infection)   Chronic diastolic CHF (congestive heart failure) (HCC)   Elevated lactic acid level   HTN (hypertension)   Hypothyroidism   Iron deficiency anemia   Fall at home, initial encounter   Abnormal LFTs   HLD (hyperlipidemia)   History of TIA (transient ischemic attack)   Depression with anxiety     Assessment and Plan: * Hyponatremia; concerns for SIADH Admission sodium 125, this morning it is 129.  Continue hydration, salt tabs.  Urine sodium 67, urinalysis 471, serum awesome 266. Fluid restriction ordered.   Acute renal failure superimposed on stage 3b chronic kidney disease ([place]) Recent baseline creatinine 0.98 on 07/30/1972.  Her creatinine is 1.65, improving. -Hold Micardis  Hypokalemia - Repletion as needed  UTI (urinary tract infection) Follow Cultures, on IV Rocephin.   Left knee pain -XR is negative.  AVOID NARCOTICS PER SON. Voltaren gel ordered.   Chronic diastolic CHF (congestive heart failure) ([place]) 2D echo 07/29/2021 showed EF of 55-60% with grade 2 diastolic dysfunction.  Patient does not have leg edema or JVD.  CHF is compensated.  No pulm edema chest x-ray.  Elevated lactic acid level Resolved.   HTN (hypertension) IV hydralazine as needed -Hold Micardis due to worsening renal function and  hyponatremia -Continue home amlodipine, clonidine, metoprolol  Hypothyroidism - Synthroid  Iron deficiency anemia Hemoglobin stable at 12.9. -Continue ferrous sulfate  Fall at home, initial encounter Fall and questionable syncope: CT head negative.  CT of C-spine negative.  No focal neurodeficit on physical examination. PT/OT  Abnormal LFTs Mild abnormal liver function.  Recheck tomorrow morning.   HLD (hyperlipidemia) - Lipitor  History of TIA (transient ischemic attack) - Aspirin, Lipitor  Depression with anxiety - Continue home medications  PT/OT  DVT prophylaxis: Lovenox Code Status: DNR Family Communication:  Called son [name].   Status is: Inpatient Cont hospital stay for SIADH management, AKI and knee pain. She also needs PT/OT  Subjective:  Seen and examined at bedside.  No new complaints.  Overall does feel weak.   Examination:  General exam: Appears calm and comfortable. Elderly frail  Respiratory system: Clear to auscultation. Respiratory effort normal. Cardiovascular system: S1 & S2 heard, RRR. No JVD, murmurs, rubs, gallops or clicks. No pedal edema. Gastrointestinal system: Abdomen is nondistended, soft and nontender. No organomegaly or masses felt. Normal bowel sounds heard. Central nervous system: Alert and oriented. No focal neurological deficits. Extremities: Symmetric 4 x 5 power. Skin: No rashes, lesions or ulcers Psychiatry: Judgement and insight appear poor    Objective: Vitals:   08/17/22 1618 08/17/22 1938 08/18/22 0005 08/18/22 0513  BP:  (!) 159/66 (!) 122/56 (!) 126/59  Pulse:  74 70 67  Resp:  '18 18 18  '$ Temp:  98.3 F (36.8 C) 98.1 F (36.7 C) 98.5 F (36.9 C)  TempSrc:  SpO2:  100% 100% 100%  Weight:      Height: '5\' 6"'$  (1.676 m)      No intake or output data in the 24 hours ending 08/18/22 0819 Filed Weights   08/17/22 0800  Weight: 71.8 kg     Data Reviewed:   CBC: Recent Labs  Lab 08/17/22 0403  08/18/22 0530  WBC 16.8* 12.7*  NEUTROABS 13.7*  --   HGB 12.9 10.2*  HCT 37.0 29.9*  MCV 85.1 88.5  PLT 322 601   Basic Metabolic Panel: Recent Labs  Lab 08/17/22 0321 08/17/22 0613 08/17/22 1607 08/17/22 2235 08/18/22 0530  NA 125*  --  126* 124* 129*  K 3.9  --  3.0* 3.1* 3.2*  CL 89*  --  93* 93* 98  CO2 17*  --  '23 24 23  '$ GLUCOSE 101*  --  115* 129* 121*  BUN 26*  --  28* 28* 25*  CREATININE 1.65*  --  1.68* 1.52* 1.29*  CALCIUM 9.9  --  8.8* 8.5* 8.4*  MG  --  1.8  --   --   --    GFR: Estimated Creatinine Clearance: 36.5 mL/min (A) (by C-G formula based on SCr of 1.29 mg/dL (H)). Liver Function Tests: Recent Labs  Lab 08/17/22 0321  AST 51*  ALT 22  ALKPHOS 57  BILITOT 1.1  PROT 9.2*  ALBUMIN 4.6   No results for input(s): "LIPASE", "AMYLASE" in the last 168 hours. No results for input(s): "AMMONIA" in the last 168 hours. Coagulation Profile: No results for input(s): "INR", "PROTIME" in the last 168 hours. Cardiac Enzymes: No results for input(s): "CKTOTAL", "CKMB", "CKMBINDEX", "TROPONINI" in the last 168 hours. BNP (last 3 results) No results for input(s): "PROBNP" in the last 8760 hours. HbA1C: No results for input(s): "HGBA1C" in the last 72 hours. CBG: No results for input(s): "GLUCAP" in the last 168 hours. Lipid Profile: No results for input(s): "CHOL", "HDL", "LDLCALC", "TRIG", "CHOLHDL", "LDLDIRECT" in the last 72 hours. Thyroid Function Tests: No results for input(s): "TSH", "T4TOTAL", "FREET4", "T3FREE", "THYROIDAB" in the last 72 hours. Anemia Panel: No results for input(s): "VITAMINB12", "FOLATE", "FERRITIN", "TIBC", "IRON", "RETICCTPCT" in the last 72 hours. Sepsis Labs: Recent Labs  Lab 08/17/22 0321 08/17/22 0613 08/17/22 2235  PROCALCITON 0.34  --   --   LATICACIDVEN  --  3.0* 0.9    Recent Results (from the past 240 hour(s))  SARS Coronavirus 2 by RT PCR (hospital order, performed in [facility] hospital lab) *cepheid  single result test* Anterior Nasal Swab     Status: None   Collection Time: 08/17/22  5:47 AM   Specimen: Anterior Nasal Swab  Result Value Ref Range Status   SARS Coronavirus 2 by RT PCR NEGATIVE NEGATIVE Final    Comment: (NOTE) SARS-CoV-2 target nucleic acids are NOT DETECTED.  The SARS-CoV-2 RNA is generally detectable in upper and lower respiratory specimens during the acute phase of infection. The lowest concentration of SARS-CoV-2 viral copies this assay can detect is 250 copies / mL. A negative result does not preclude SARS-CoV-2 infection and should not be used as the sole basis for treatment or other patient management decisions.  A negative result may occur with improper specimen collection / handling, submission of specimen other than nasopharyngeal swab, presence of viral mutation(s) within the areas targeted by this assay, and inadequate number of viral copies (<250 copies / mL). A negative result must be combined with clinical observations, patient history, and epidemiological  information.  Fact Sheet for Patients:   https://www.patel.info/  Fact Sheet for Healthcare Providers: https://hall.com/  This test is not yet approved or  cleared by the [place] FDA and has been authorized for detection and/or diagnosis of SARS-CoV-2 by FDA under an Emergency Use Authorization (EUA).  This EUA will remain in effect (meaning this test can be used) for the duration of the COVID-19 declaration under Section 564(b)(1) of the Act, 21 U.S.C. section 360bbb-3(b)(1), unless the authorization is terminated or revoked sooner.  Performed at [facility], [address] [place], [place] [address]   Blood culture (routine x 2)     Status: None (Preliminary result)   Collection Time: 08/17/22  6:13 AM   Specimen: BLOOD RIGHT HAND  Result Value Ref Range Status   Specimen Description BLOOD RIGHT HAND  Final   Special Requests    Final    BOTTLES DRAWN AEROBIC AND ANAEROBIC Blood Culture adequate volume   Culture   Final    NO GROWTH < 24 HOURS Performed at [facility], [address], [place], [place] [address]    Report Status PENDING  Incomplete  Blood culture (routine x 2)     Status: None (Preliminary result)   Collection Time: 08/17/22  6:13 AM   Specimen: BLOOD RIGHT ARM  Result Value Ref Range Status   Specimen Description BLOOD RIGHT ARM  Final   Special Requests   Final    BOTTLES DRAWN AEROBIC AND ANAEROBIC Blood Culture results may not be optimal due to an inadequate volume of blood received in culture bottles   Culture   Final    NO GROWTH < 24 HOURS Performed at [facility], [address], [place], [place] [address]    Report Status PENDING  Incomplete         Radiology Studies: IR Fluoro Guide CV Line Left  Result Date: 08/17/2022 INDICATION: 76 year old woman with sepsis and poor venous access presents to IR for central line placement. EXAM: 1. Ultrasound-guided access of right internal jugular vein. 2. Ultrasound-guided access of left internal jugular vein. 3. Left internal jugular vein non tunneled triple-lumen central line placement. MEDICATIONS: None ANESTHESIA/SEDATION: None FLUOROSCOPY: Radiation Exposure Index (as provided by the fluoroscopic device): 84 mGy Kerma COMPLICATIONS: None immediate. PROCEDURE: Informed written consent was obtained from the patient after a thorough discussion of the procedural risks, benefits and alternatives. All questions were addressed. Maximal Sterile Barrier Technique was utilized including caps, mask, sterile gowns, sterile gloves, sterile drape, hand hygiene and skin antiseptic. A timeout was performed prior to the initiation of the procedure. Ultrasound image documenting patency of the right internal jugular vein was obtained and placed in permanent medical record. Sterile ultrasound probe cover and gel utilized throughout the  procedure. Utilizing continuous ultrasound guidance, the right internal jugular vein was accessed with a 21 gauge needle. 0.018 inch Nitrex guidewire could not be successfully advanced centrally. The right jugular vein was accessed 2 additional times utilizing continuous ultrasound guidance a 21 gauge needle, however the Nitrex wire could not be advanced centrally. Decision was made to place a left-sided central venous catheter. Left neck skin prepped and draped in usual fashion. Ultrasound image documenting patency of the left internal jugular vein was obtained and placed in permanent medical record. Sterile ultrasound probe cover and gel utilized throughout the procedure. Utilizing continuous ultrasound guidance, the left internal jugular vein was accessed with a 21 gauge needle. 21 gauge needle exchanged for a transitional dilator set  over 0.018 inch guidewire. 0.035 inch guidewire advanced through the 5 French dilator to the level of the inferior vena cava. Tract dilation was performed and triple-lumen non tunneled central venous catheter was inserted over the guidewire. The tip was positioned at the cavoatrial junction. All lumens aspirated and flushed well. The catheter was secured to skin with suture and covered with sterile dressing. IMPRESSION: 1. Left IJ non tunneled triple-lumen central venous catheter is ready for use. 2. 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 Electronically Signed   By: Miachel Roux M.D.   On: 08/17/2022 15:16   IR US Guide Vasc Access Left  Result Date: 08/17/2022 INDICATION: 76 year old woman with sepsis and poor venous access presents to IR for central line placement. EXAM: 1. Ultrasound-guided access of right internal jugular vein. 2. Ultrasound-guided access of left internal jugular vein. 3. Left internal jugular vein non tunneled triple-lumen central line placement.  MEDICATIONS: None ANESTHESIA/SEDATION: None FLUOROSCOPY: Radiation Exposure Index (as provided by the fluoroscopic device): 84 mGy Kerma COMPLICATIONS: None immediate. PROCEDURE: Informed written consent was obtained from the patient after a thorough discussion of the procedural risks, benefits and alternatives. All questions were addressed. Maximal Sterile Barrier Technique was utilized including caps, mask, sterile gowns, sterile gloves, sterile drape, hand hygiene and skin antiseptic. A timeout was performed prior to the initiation of the procedure. Ultrasound image documenting patency of the right internal jugular vein was obtained and placed in permanent medical record. Sterile ultrasound probe cover and gel utilized throughout the procedure. Utilizing continuous ultrasound guidance, the right internal jugular vein was accessed with a 21 gauge needle. 0.018 inch Nitrex guidewire could not be successfully advanced centrally. The right jugular vein was accessed 2 additional times utilizing continuous ultrasound guidance a 21 gauge needle, however the Nitrex wire could not be advanced centrally. Decision was made to place a left-sided central venous catheter. Left neck skin prepped and draped in usual fashion. Ultrasound image documenting patency of the left internal jugular vein was obtained and placed in permanent medical record. Sterile ultrasound probe cover and gel utilized throughout the procedure. Utilizing continuous ultrasound guidance, the left internal jugular vein was accessed with a 21 gauge needle. 21 gauge needle exchanged for a transitional dilator set over 0.018 inch guidewire. 0.035 inch guidewire advanced through the 5 French dilator to the level of the inferior vena cava. Tract dilation was performed and triple-lumen non tunneled central venous catheter was inserted over the guidewire. The tip was positioned at the cavoatrial junction. All lumens aspirated and flushed well. The catheter was  secured to skin with suture and covered with sterile dressing. IMPRESSION: 1. Left IJ non tunneled triple-lumen central venous catheter is ready for use. 2. 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 Electronically Signed   By: Miachel Roux M.D.   On: 08/17/2022 15:16   IR US Guide Vasc Access Right  Result Date: 08/17/2022 INDICATION: 76 year old woman with sepsis and poor venous access presents to IR for central line placement. EXAM: 1. Ultrasound-guided access of right internal jugular vein. 2. Ultrasound-guided access of left internal jugular vein. 3. Left internal jugular vein non tunneled triple-lumen central line placement. MEDICATIONS: None ANESTHESIA/SEDATION: None FLUOROSCOPY: Radiation Exposure Index (as provided by the fluoroscopic device): 84 mGy Kerma COMPLICATIONS: None immediate. PROCEDURE: Informed written consent was obtained from  the patient after a thorough discussion of the procedural risks, benefits and alternatives. All questions were addressed. Maximal Sterile Barrier Technique was utilized including caps, mask, sterile gowns, sterile gloves, sterile drape, hand hygiene and skin antiseptic. A timeout was performed prior to the initiation of the procedure. Ultrasound image documenting patency of the right internal jugular vein was obtained and placed in permanent medical record. Sterile ultrasound probe cover and gel utilized throughout the procedure. Utilizing continuous ultrasound guidance, the right internal jugular vein was accessed with a 21 gauge needle. 0.018 inch Nitrex guidewire could not be successfully advanced centrally. The right jugular vein was accessed 2 additional times utilizing continuous ultrasound guidance a 21 gauge needle, however the Nitrex wire could not be advanced centrally. Decision was made to place a left-sided central venous catheter. Left neck skin prepped and  draped in usual fashion. Ultrasound image documenting patency of the left internal jugular vein was obtained and placed in permanent medical record. Sterile ultrasound probe cover and gel utilized throughout the procedure. Utilizing continuous ultrasound guidance, the left internal jugular vein was accessed with a 21 gauge needle. 21 gauge needle exchanged for a transitional dilator set over 0.018 inch guidewire. 0.035 inch guidewire advanced through the 5 French dilator to the level of the inferior vena cava. Tract dilation was performed and triple-lumen non tunneled central venous catheter was inserted over the guidewire. The tip was positioned at the cavoatrial junction. All lumens aspirated and flushed well. The catheter was secured to skin with suture and covered with sterile dressing. IMPRESSION: 1. Left IJ non tunneled triple-lumen central venous catheter is ready for use. 2. 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 Electronically Signed   By: Miachel Roux M.D.   On: 08/17/2022 15:16   [place] EKG SITE RITE  Result Date: 08/17/2022 If Site Rite image not attached, placement could not be confirmed due to current cardiac rhythm.  DG Chest Portable 1 View  Result Date: 08/17/2022 CLINICAL DATA:  76 year old female status post fall last night. Leukocytosis. EXAM: PORTABLE CHEST 1 VIEW COMPARISON:  CT Chest, Abdomen, and Pelvis today are reported separately. 07/27/2022. FINDINGS: Portable AP upright view at 0541 hours. Lung volumes and mediastinal contours are within normal limits. Allowing for portable technique the lungs are clear. No pneumothorax or pleural effusion identified. No acute osseous abnormality identified. Left mastectomy. Negative visible bowel gas. Visualized tracheal air column is within normal limits. IMPRESSION: No acute cardiopulmonary abnormality or acute traumatic injury identified.  Electronically Signed   By: Genevie Ann M.D.   On: 08/17/2022 06:07   CT Head Wo Contrast  Result Date: 08/17/2022 CLINICAL DATA:  Fall EXAM: CT HEAD WITHOUT CONTRAST CT CERVICAL SPINE WITHOUT CONTRAST TECHNIQUE: Multidetector CT imaging of the head and cervical spine was performed following the standard protocol without intravenous contrast. Multiplanar CT image reconstructions of the cervical spine were also generated. RADIATION DOSE REDUCTION: This exam was performed according to the departmental dose-optimization program which includes automated exposure control, adjustment of the mA and/or kV according to patient size and/or use of iterative reconstruction technique. COMPARISON:  CT head dated 07/26/2022. CT head/cervical spine dated 06/28/2022. FINDINGS: CT HEAD FINDINGS Brain: No evidence of acute infarction, hemorrhage, hydrocephalus, extra-axial collection or mass lesion/mass effect. Subcortical white matter and periventricular small vessel ischemic changes. Vascular: Mild intracranial atherosclerosis. Skull: Normal. Negative for fracture or focal lesion. Sinuses/Orbits: The visualized paranasal sinuses are  essentially clear. The mastoid air cells are unopacified. Other: None. CT CERVICAL SPINE FINDINGS Alignment: Normal cervical lordosis. Skull base and vertebrae: No acute fracture. No primary bone lesion or focal pathologic process. Soft tissues and spinal canal: No prevertebral fluid or swelling. No visible canal hematoma. Disc levels: Moderate degenerative changes of the mid cervical spine. Spinal canal is patent. Upper chest: Visualized lung apices are clear. Other: Visualized thyroid is unremarkable. IMPRESSION: No evidence of acute intracranial abnormality. Small vessel ischemic changes. No evidence of traumatic injury to the cervical spine. Moderate degenerative changes. Electronically Signed   By: Julian Hy M.D.   On: 08/17/2022 03:22   CT Cervical Spine Wo Contrast  Result Date:  08/17/2022 CLINICAL DATA:  Fall EXAM: CT HEAD WITHOUT CONTRAST CT CERVICAL SPINE WITHOUT CONTRAST TECHNIQUE: Multidetector CT imaging of the head and cervical spine was performed following the standard protocol without intravenous contrast. Multiplanar CT image reconstructions of the cervical spine were also generated. RADIATION DOSE REDUCTION: This exam was performed according to the departmental dose-optimization program which includes automated exposure control, adjustment of the mA and/or kV according to patient size and/or use of iterative reconstruction technique. COMPARISON:  CT head dated 07/26/2022. CT head/cervical spine dated 06/28/2022. FINDINGS: CT HEAD FINDINGS Brain: No evidence of acute infarction, hemorrhage, hydrocephalus, extra-axial collection or mass lesion/mass effect. Subcortical white matter and periventricular small vessel ischemic changes. Vascular: Mild intracranial atherosclerosis. Skull: Normal. Negative for fracture or focal lesion. Sinuses/Orbits: The visualized paranasal sinuses are essentially clear. The mastoid air cells are unopacified. Other: None. CT CERVICAL SPINE FINDINGS Alignment: Normal cervical lordosis. Skull base and vertebrae: No acute fracture. No primary bone lesion or focal pathologic process. Soft tissues and spinal canal: No prevertebral fluid or swelling. No visible canal hematoma. Disc levels: Moderate degenerative changes of the mid cervical spine. Spinal canal is patent. Upper chest: Visualized lung apices are clear. Other: Visualized thyroid is unremarkable. IMPRESSION: No evidence of acute intracranial abnormality. Small vessel ischemic changes. No evidence of traumatic injury to the cervical spine. Moderate degenerative changes. Electronically Signed   By: Julian Hy M.D.   On: 08/17/2022 03:22   DG Knee Complete 4 Views Left  Result Date: 08/17/2022 CLINICAL DATA:  Left knee pain/injury EXAM: LEFT KNEE - COMPLETE 4+ VIEW COMPARISON:  None  Available. FINDINGS: No fracture or dislocation is seen. Mild tricompartmental degenerative changes, most prominent in the patellofemoral compartment. Lateral compartment chondrocalcinosis. No suprapatellar knee joint effusion. IMPRESSION: No fracture or dislocation is seen. Mild degenerative changes with chondrocalcinosis. Electronically Signed   By: Julian Hy M.D.   On: 08/17/2022 02:42        Scheduled Meds:  amLODipine  5 mg Oral Daily   aspirin EC  81 mg Oral Daily   bisacodyl  10 mg Rectal Q1200   Chlorhexidine Gluconate Cloth  6 each Topical Daily   cholecalciferol  5,000 Units Oral Daily   cloNIDine  0.1 mg Oral BID   docusate sodium  100 mg Oral BID   enoxaparin (LOVENOX) injection  30 mg Subcutaneous Q24H   ferrous sulfate  325 mg Oral Q breakfast   FLUoxetine  20 mg Oral Daily   levothyroxine  75 mcg Oral QAC breakfast   LORazepam  0.5 mg Oral Daily   magnesium oxide  400 mg Oral Daily   metoprolol succinate  50 mg Oral BID   multivitamin with minerals  1 tablet Oral Daily   Muscle Rub  1 Application Topical BID   pantoprazole  40 mg Oral Daily   sodium chloride  1 g Oral BID WC   Continuous Infusions:  sodium chloride 100 mL/hr at 08/18/22 0605   cefTRIAXone (ROCEPHIN)  IV     promethazine (PHENERGAN) injection (IM or IVPB) 12.5 mg (08/18/22 0012)     LOS: 1 day   Time spent= 35 mins    Cyanne Delmar Arsenio Loader, MD [facility]  If 7PM-7AM, please contact night-coverage  08/18/2022, 8:19 AM

## 2022-08-18 NOTE — Progress Notes (Signed)
   08/18/22 1600  Clinical Encounter Type  Visited With Patient  Visit Type Initial  Referral From Chaplain  Consult/Referral To [place] responded to referral from previous Chaplain to visit and provide spiritual care to patient.

## 2022-08-18 NOTE — Progress Notes (Signed)
FL2 completed. Bed search initiated for bed search.

## 2022-08-18 NOTE — Progress Notes (Signed)
Anticoagulation monitoring(Lovenox):  76yo  F ordered Lovenox 30 mg Q24h    Filed Weights   08/17/22 0800  Weight: 71.8 kg (158 lb 4.6 oz)   BMI 25   Lab Results  Component Value Date   CREATININE 1.29 (H) 08/18/2022   CREATININE 1.52 (H) 08/17/2022   CREATININE 1.68 (H) 08/17/2022   Estimated Creatinine Clearance: 36.5 mL/min (A) (by C-G formula based on SCr of 1.29 mg/dL (H)). Hemoglobin & Hematocrit     Component Value Date/Time   HGB 10.2 (L) 08/18/2022 0530   HCT 29.9 (L) 08/18/2022 0530     Per Protocol for Patient with estCrcl now > 30 ml/min and BMI < 30, will transition to Lovenox 40 mg Q24h       Chinita Greenland PharmD Clinical Pharmacist 08/18/2022

## 2022-08-18 NOTE — Evaluation (Signed)
Occupational Therapy Evaluation Patient Details Name: Cindy Huerta MRN: [id number] DOB: 28-Nov-1946 Today's Date: 08/18/2022   History of Present Illness Pt is a 76 year old female presenting following fall and dysruia PMH significant for HTN, hyperlipidemia, TIA, GERD, hypertension, depression with anxiety, CKD-3b, GI bleeding, dCHF, hyponatremia   Clinical Impression   Chart reviewed, nurse cleared pt for participation in OT evaluation. Co tx completed with PT on this date. Pt oriented x4 however very soft spoken and presents with poor processing for all ADL/mobility on this date. Pt continues to report significant pain in L knee despite premedicated. Pt presents with deficits in strength, endurance,activity tolerance, pain all affecting  safe and optimal ADL completion. Pt reports dizziness, decreased communication while seated on bsc, therefore pt returned to bed. BP 129/48 (Map 74), spo2 97% on RA, HR 72. Pt is performing significantly below previous baseline per chart review from admission approx 3 weeks ago. Recommend discharge to STR  at this time to address functional deficits. OT will continue to follow acutely.      Recommendations for follow up therapy are one component of a multi-disciplinary discharge planning process, led by the attending physician.  Recommendations may be updated based on patient status, additional functional criteria and insurance authorization.   Follow Up Recommendations  Skilled nursing-short term rehab (<3 hours/day)    Assistance Recommended at Discharge Frequent or constant Supervision/Assistance  Patient can return home with the following A lot of help with walking and/or transfers;A lot of help with bathing/dressing/bathroom    Functional Status Assessment  Patient has had a recent decline in their functional status and demonstrates the ability to make significant improvements in function in a reasonable and predictable amount of time.  Equipment  Recommendations  Other (comment) (per next venue of care)    Recommendations for Other Services       Precautions / Restrictions Precautions Precautions: Fall Restrictions Weight Bearing Restrictions: No      Mobility Bed Mobility Overal bed mobility: Needs Assistance Bed Mobility: Supine to Sit, Sit to Supine     Supine to sit: Mod assist, Max assist, HOB elevated, +2 for physical assistance Sit to supine: HOB elevated, +2 for physical assistance, Mod assist, Max assist        Transfers Overall transfer level: Needs assistance Equipment used: Rolling walker (2 wheels) Transfers: Sit to/from Stand Sit to Stand: Mod assist, Max assist, From elevated surface           General transfer comment: off bed with MOD A, MAX A for off bsc, +1-2      Balance Overall balance assessment: Needs assistance Sitting-balance support: Feet supported Sitting balance-Leahy Scale: Fair   Postural control: Posterior lean Standing balance support: During functional activity Standing balance-Leahy Scale: Poor                             ADL either performed or assessed with clinical judgement   ADL Overall ADL's : Needs assistance/impaired                         Toilet Transfer: Moderate assistance;Maximal assistance;BSC/3in1;Stand-pivot;+2 for physical assistance   Toileting- Clothing Manipulation and Hygiene: Moderate assistance;Bed level               Vision Patient Visual Report: No change from baseline Vision Assessment?: No apparent visual deficits     Perception     Praxis  Pertinent Vitals/Pain Pain Assessment Pain Assessment: Faces Faces Pain Scale: Hurts even more Pain Location: LLE with movement Pain Descriptors / Indicators: Aching, Grimacing, Discomfort Pain Intervention(s): Limited activity within patient's tolerance, Premedicated before session, Repositioned     Hand Dominance Right   Extremity/Trunk Assessment Upper  Extremity Assessment Upper Extremity Assessment: Generalized weakness   Lower Extremity Assessment Lower Extremity Assessment: Generalized weakness   Cervical / Trunk Assessment Cervical / Trunk Assessment: Normal   Communication Communication Communication: Expressive difficulties (mumbling)   Cognition Arousal/Alertness: Awake/alert Behavior During Therapy: WFL for tasks assessed/performed Overall Cognitive Status: No family/caregiver present to determine baseline cognitive functioning Area of Impairment: Attention, Following commands, Safety/judgement, Awareness, Problem solving                   Current Attention Level: Sustained   Following Commands: Follows one step commands with increased time Safety/Judgement: Decreased awareness of deficits Awareness: Emergent Problem Solving: Slow processing, Decreased initiation, Difficulty sequencing, Requires verbal cues, Requires tactile cues       General Comments       Exercises     Shoulder Instructions      Home Living Family/patient expects to be discharged to:: Private residence Living Arrangements: Children Available Help at Discharge: Family;Available 24 hours/day Type of Home: House Home Access: Stairs to enter [employer] of Steps: 2 Entrance Stairs-Rails: None Home Layout: Two level;Bed/bath upstairs Alternate Level Stairs-Number of Steps: 1/2 flight + landing + 1/2 flight Alternate Level Stairs-Rails: Left;Right;Can reach both Bathroom Shower/Tub: Walk-in shower         Home Equipment: Conservation officer, nature (2 wheels);Cane - quad;Shower seat   Additional Comments: PLOF and home set up from chart from previous admission      Prior Functioning/Environment Prior Level of Function : Independent/Modified Independent;History of Falls (last six months);Patient poor historian/Family not available;Driving             Mobility Comments: amb with RW since previous admissions, fall history ADLs  Comments: Per chart pt is generally MOD I in bathing/dressing, assist with IADLs from famliy        OT Problem List: Decreased strength;Decreased range of motion;Decreased activity tolerance;Impaired balance (sitting and/or standing);Impaired UE functional use      OT Treatment/Interventions: Self-care/ADL training;Therapeutic exercise;Energy conservation;DME and/or AE instruction;Therapeutic activities;Balance training;Patient/family education    OT Goals(Current goals can be found in the care plan section) Acute Rehab OT Goals Patient Stated Goal: decrease pain OT Goal Formulation: With patient Time For Goal Achievement: 09/02/22 Potential to Achieve Goals: Good ADL Goals Pt Will Perform Grooming: with supervision;sitting Pt Will Transfer to Toilet: with min assist Pt Will Perform Toileting - Clothing Manipulation and hygiene: with supervision;sit to/from stand Pt Will Perform Tub/Shower Transfer: with supervision  OT Frequency: Min 2X/week    Co-evaluation PT/OT/SLP Co-Evaluation/Treatment: Yes Reason for Co-Treatment: Necessary to address cognition/behavior during functional activity;For patient/therapist safety;To address functional/ADL transfers          AM-PAC OT "6 Clicks" Daily Activity     Outcome Measure Help from another person eating meals?: A Little Help from another person taking care of personal grooming?: A Little Help from another person toileting, which includes using toliet, bedpan, or urinal?: A Lot Help from another person bathing (including washing, rinsing, drying)?: A Lot Help from another person to put on and taking off regular upper body clothing?: A Little Help from another person to put on and taking off regular lower body clothing?: A Lot 6 Click Score: 15  End of Session Equipment Utilized During Treatment: Rolling walker (2 wheels) Nurse Communication: Mobility status  Activity Tolerance: Patient tolerated treatment well Patient left: in  bed;with call bell/phone within reach;with bed alarm set;with family/visitor present  OT Visit Diagnosis: Unsteadiness on feet (R26.81);Other abnormalities of gait and mobility (R26.89);Muscle weakness (generalized) (M62.81)                Time: 5374-8270 OT Time Calculation (min): 25 min Charges:  OT General Charges $OT Visit: 1 Visit OT Evaluation $OT Eval Low Complexity: 1 Low  Shanon Payor, OTD OTR/L  08/18/22, 12:45 PM

## 2022-08-18 NOTE — NC FL2 (Signed)
[place] LEVEL OF CARE SCREENING TOOL     IDENTIFICATION  Patient Name: Cindy Huerta Birthdate: [birth year] Sex: female Admission Date (Current Location): 08/17/2022  [facility] [place] and [place] Number:  Engineering geologist and Address:  [facility], [address], [place], [place] [address]      Provider Number: [id number]  Attending Physician Name and Address:  Damita Lack, MD  Relative Name and Phone Number:  [name] [phone],[phone]    Current Level of Care: Hospital Recommended Level of Care: [place] Prior Approval Number:    Date Approved/Denied:   PASRR Number: [id number]  Discharge Plan: SNF    Current Diagnoses: Patient Active Problem List   Diagnosis Date Noted   Iron deficiency anemia 08/17/2022   Elevated lactic acid level 08/17/2022   UTI (urinary tract infection) 08/17/2022   Fall at home, initial encounter 08/17/2022   Abnormal LFTs 08/17/2022   Dehydration 07/27/2022   Malnutrition of moderate degree 06/30/2022   Acute urinary retention 06/29/2022   Acute renal failure superimposed on stage 3b chronic kidney disease ([place]) 06/28/2022   DNR (do not resuscitate) 06/28/2022   Hyponatremia 06/18/2022   Hypokalemia 06/18/2022   Chronic diastolic CHF (congestive heart failure) ([place]) 06/18/2022   Dysuria 06/18/2022   HTN (hypertension) 11/24/2021   Normocytic anemia 11/24/2021   Vertigo 11/24/2021   Breast CA ([place]) 11/22/2021   Breast cancer ([place]) 11/20/2021   Intractable nausea and vomiting 10/09/2021   Weakness 10/07/2021   Carcinoma of overlapping sites of left breast in female, estrogen receptor positive ([place]) 10/06/2021   Dizziness 10/06/2021   Rectal bleeding 10/06/2021   Nausea & vomiting 10/06/2021   GIB (gastrointestinal bleeding) 10/06/2021   Left-sided weakness 07/29/2021   HLD (hyperlipidemia) 07/29/2021   Hypothyroidism 07/29/2021   Left sided numbness 07/29/2021   Depression  with anxiety    CKD (chronic kidney disease), stage IIIa    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   Epigastric pain 04/23/2021   GERD (gastroesophageal reflux disease) 04/23/2021   Benign essential hypertension 01/20/2021   History of breast cancer 01/20/2021   History of TIA (transient ischemic attack) 01/20/2021   Palpitations 01/20/2021   Malignant neoplasm of upper-outer quadrant of left female breast ([place]) 12/06/2018    Orientation RESPIRATION BLADDER Height & Weight     Self  Normal Continent Weight: 71.8 kg Height:  '5\' 6"'$  (167.6 cm)  BEHAVIORAL SYMPTOMS/MOOD NEUROLOGICAL BOWEL NUTRITION STATUS   (n/a)  (n/a) Continent Diet  AMBULATORY STATUS COMMUNICATION OF NEEDS Skin   Limited Assist Verbally Bruising (R arm)                       Personal Care Assistance Level of Assistance  Bathing, Dressing Bathing Assistance: Limited assistance   Dressing Assistance: Limited assistance     Functional Limitations Info  Sight Sight Info: Impaired        SPECIAL CARE FACTORS FREQUENCY  PT (By licensed PT), OT (By licensed OT)                    Contractures Contractures Info: Not present    Additional Factors Info  Allergies, Code Status Code Status Info: DNR Allergies Info: Latex, Novocain (Procaine), Zestril (Lisinopril), Benadryl (Diphenhydramine), Biaxin (Clarithromycin), Roxicodone (Oxycodone)           Current Medications (08/18/2022):  This is the current hospital active medication list Current Facility-Administered Medications  Medication Dose Route Frequency Provider Last Rate Last Admin  acetaminophen (TYLENOL) tablet 650 mg  650 mg Oral Q6H PRN Ivor Costa, MD   650 mg at 08/18/22 2058   alum & mag hydroxide-simeth (MAALOX/MYLANTA) 200-200-20 MG/5ML suspension 30 mL  30 mL Oral Q6H PRN Ivor Costa, MD       amLODipine (NORVASC) tablet 5 mg  5 mg Oral Daily Ivor Costa, MD   5 mg at 08/18/22 1012   aspirin EC tablet 81 mg  81 mg Oral Daily Ivor Costa, MD    81 mg at 08/18/22 1012   bisacodyl (DULCOLAX) suppository 10 mg  10 mg Rectal Q1200 Ivor Costa, MD   10 mg at 08/18/22 1502   cefTRIAXone (ROCEPHIN) 1 g in sodium chloride 0.9 % 100 mL IVPB  1 g Intravenous Q24H Ivor Costa, MD 200 mL/hr at 08/18/22 1010 1 g at 08/18/22 1010   Chlorhexidine Gluconate Cloth 2 % PADS 6 each  6 each Topical Daily Ivor Costa, MD   6 each at 08/18/22 6767   cholecalciferol (VITAMIN D3) 25 MCG (1000 UNIT) tablet 5,000 Units  5,000 Units Oral Daily Ivor Costa, MD   5,000 Units at 08/18/22 1023   cloNIDine (CATAPRES) tablet 0.1 mg  0.1 mg Oral BID Ivor Costa, MD   0.1 mg at 08/18/22 2059   diclofenac Sodium (VOLTAREN) 1 % topical gel 4 g  4 g Topical QID Damita Lack, MD   4 g at 08/18/22 2101   docusate sodium (COLACE) capsule 100 mg  100 mg Oral BID Ivor Costa, MD   100 mg at 08/18/22 2058   [START ON 08/19/2022] enoxaparin (LOVENOX) injection 40 mg  40 mg Subcutaneous Q24H Chinita Greenland A, RPH       ferrous sulfate tablet 325 mg  325 mg Oral Q breakfast Ivor Costa, MD   325 mg at 08/18/22 2094   FLUoxetine (PROZAC) capsule 20 mg  20 mg Oral Daily Ivor Costa, MD   20 mg at 08/18/22 1012   fluticasone (FLONASE) 50 MCG/ACT nasal spray 1 spray  1 spray Each Nare Daily PRN Ivor Costa, MD       guaiFENesin (ROBITUSSIN) 100 MG/5ML liquid 5 mL  5 mL Oral Q4H PRN Amin, Ankit Chirag, MD       hydrALAZINE (APRESOLINE) injection 10 mg  10 mg Intravenous Q4H PRN Amin, Ankit Chirag, MD       ipratropium-albuterol (DUONEB) 0.5-2.5 (3) MG/3ML nebulizer solution 3 mL  3 mL Nebulization Q4H PRN Amin, Ankit Chirag, MD       levothyroxine (SYNTHROID) tablet 75 mcg  75 mcg Oral QAC breakfast Ivor Costa, MD   75 mcg at 08/18/22 0515   LORazepam (ATIVAN) tablet 0.5 mg  0.5 mg Oral Daily Ivor Costa, MD   0.5 mg at 08/18/22 1013   magnesium oxide (MAG-OX) tablet 400 mg  400 mg Oral Daily Ivor Costa, MD   400 mg at 08/18/22 1013   meclizine (ANTIVERT) tablet 12.5 mg  12.5 mg Oral TID PRN  Ivor Costa, MD       metoprolol succinate (TOPROL-XL) 24 hr tablet 50 mg  50 mg Oral BID Ivor Costa, MD   50 mg at 08/18/22 2058   metoprolol tartrate (LOPRESSOR) injection 5 mg  5 mg Intravenous Q4H PRN Amin, Ankit Chirag, MD       morphine (PF) 2 MG/ML injection 1 mg  1 mg Intravenous Q4H PRN Ivor Costa, MD   1 mg at 08/18/22 0009   multivitamin with minerals tablet 1 tablet  1 tablet Oral Daily Ivor Costa, MD   1 tablet at 08/18/22 1012   Muscle Rub CREA 1 Application  1 Application Topical BID Ivor Costa, MD   1 Application at 74/08/14 2101   ondansetron (ZOFRAN) injection 4 mg  4 mg Intravenous Q8H PRN Ivor Costa, MD   4 mg at 08/18/22 0923   pantoprazole (PROTONIX) EC tablet 40 mg  40 mg Oral Daily Ivor Costa, MD   40 mg at 08/18/22 1013   polyethylene glycol (MIRALAX / GLYCOLAX) packet 17 g  17 g Oral Daily PRN Ivor Costa, MD       polyvinyl alcohol (LIQUIFILM TEARS) 1.4 % ophthalmic solution 1 drop  1 drop Both Eyes Daily PRN Ivor Costa, MD       promethazine (PHENERGAN) 12.5 mg in sodium chloride 0.9 % 50 mL IVPB  12.5 mg Intravenous Q6H PRN Mansy, Jan A, MD 200 mL/hr at 08/18/22 0012 12.5 mg at 08/18/22 0012   sodium chloride tablet 1 g  1 g Oral BID WC Ivor Costa, MD   1 g at 08/18/22 1708   traZODone (DESYREL) tablet 50 mg  50 mg Oral QHS PRN Damita Lack, MD   50 mg at 08/18/22 4818   trolamine salicylate (ASPERCREME) 10 % cream 1 Application  1 Application Topical PRN Ivor Costa, MD   1 Application at 56/31/49 1015     Discharge Medications: Please see discharge summary for a list of discharge medications.  Relevant Imaging Results:  Relevant Lab Results:   Additional Information SS# [id number]  Laurena Slimmer, RN

## 2022-08-18 NOTE — Evaluation (Signed)
Physical Therapy Evaluation Patient Details Name: RONIT CRANFIELD MRN: [id number] DOB: Oct 19, 1946 Today's Date: 08/18/2022  History of Present Illness  Pt is a 76 year old female presenting following fall and dysruia. PMH significant for HTN, hyperlipidemia, TIA, GERD, hypertension, depression with anxiety, CKD-3b, GI bleeding, dCHF, hyponatremia.   Clinical Impression  Patient alert, able to answer orientation questions but noted to have delayed processing, sequencing, and with fatigue or multi-step commands, confusion noted. Pt did have difficulty with responsiveness at the end of the session as well. PLOF gathered predominately from chart review, pt ambulatory with RW and lives with her son.   The patient required mod-maxA to transfer to EOB and reposition in midline. Sit <> stand attempted with RW and modAx2, unable to step and pivot to [facility]. maxAx2 to transfer to and from commode. Returned to EOB quickly due to pts noted decreased responsiveness; vitals assessed in supine and WFLs. maxAx2 to return to supine as well.  Overall the patient demonstrated deficits (see "PT Problem List") that impede the patient's functional abilities, safety, and mobility and would benefit from skilled PT intervention. Recommendation is SNF due to current level of assistance needed and decline from PLOF.        Recommendations for follow up therapy are one component of a multi-disciplinary discharge planning process, led by the attending physician.  Recommendations may be updated based on patient status, additional functional criteria and insurance authorization.  Follow Up Recommendations Skilled nursing-short term rehab (<3 hours/day) Can patient physically be transported by private vehicle: No    Assistance Recommended at Discharge    Patient can return home with the following  Two people to help with walking and/or transfers;Two people to help with bathing/dressing/bathroom;Direct supervision/assist for  medications management;Help with stairs or ramp for entrance;Assist for transportation;Assistance with cooking/housework;Direct supervision/assist for financial management    Equipment Recommendations Other (comment) (TBD at next venue of care)  Recommendations for Other Services       Functional Status Assessment Patient has had a recent decline in their functional status and demonstrates the ability to make significant improvements in function in a reasonable and predictable amount of time.     Precautions / Restrictions Precautions Precautions: Fall Restrictions Weight Bearing Restrictions: No      Mobility  Bed Mobility Overal bed mobility: Needs Assistance Bed Mobility: Supine to Sit, Sit to Supine     Supine to sit: Mod assist, Max assist, HOB elevated, +2 for physical assistance Sit to supine: HOB elevated, +2 for physical assistance, Mod assist, Max assist        Transfers Overall transfer level: Needs assistance Equipment used: Rolling walker (2 wheels) Transfers: Sit to/from Stand Sit to Stand: Mod assist, Max assist, From elevated surface           General transfer comment: off bed with MOD A, MAX A for off bsc, +1-2    Ambulation/Gait                  Stairs            Wheelchair Mobility    Modified Rankin (Stroke Patients Only)       Balance Overall balance assessment: Needs assistance Sitting-balance support: Feet supported Sitting balance-Leahy Scale: Fair   Postural control: Posterior lean Standing balance support: During functional activity Standing balance-Leahy Scale: Poor  Pertinent Vitals/Pain Pain Assessment Pain Assessment: Faces Faces Pain Scale: Hurts even more Pain Location: LLE with movement Pain Descriptors / Indicators: Aching, Grimacing, Discomfort Pain Intervention(s): Limited activity within patient's tolerance, Monitored during session, Repositioned    Home  Living Family/patient expects to be discharged to:: Private residence Living Arrangements: Children Available Help at Discharge: Family;Available 24 hours/day Type of Home: House Home Access: Stairs to enter Entrance Stairs-Rails: None Entrance Stairs-Number of Steps: 2 Alternate Level Stairs-Number of Steps: 1/2 flight + landing + 1/2 flight Home Layout: Two level;Bed/bath upstairs Home Equipment: Conservation officer, nature (2 wheels);Cane - quad;Shower seat Additional Comments: PLOF and home set up from chart from previous admission    Prior Function Prior Level of Function : Independent/Modified Independent;History of Falls (last six months);Patient poor historian/Family not available;Driving             Mobility Comments: amb with RW since previous admissions, fall history ADLs Comments: Per chart pt is generally MOD I in bathing/dressing, assist with IADLs from famliy     Hand Dominance   Dominant Hand: Right    Extremity/Trunk Assessment   Upper Extremity Assessment Upper Extremity Assessment: Generalized weakness    Lower Extremity Assessment Lower Extremity Assessment: Generalized weakness    Cervical / Trunk Assessment Cervical / Trunk Assessment: Normal  Communication   Communication: Expressive difficulties (mumbling)  Cognition Arousal/Alertness: Awake/alert Behavior During Therapy: WFL for tasks assessed/performed Overall Cognitive Status: No family/caregiver present to determine baseline cognitive functioning Area of Impairment: Attention, Following commands, Safety/judgement, Awareness, Problem solving                   Current Attention Level: Sustained   Following Commands: Follows one step commands with increased time Safety/Judgement: Decreased awareness of deficits Awareness: Emergent Problem Solving: Slow processing, Decreased initiation, Difficulty sequencing, Requires verbal cues, Requires tactile cues          General Comments       Exercises     Assessment/Plan    PT Assessment Patient needs continued PT services  PT Problem List Decreased strength;Decreased activity tolerance;Decreased balance;Decreased mobility       PT Treatment Interventions DME instruction;Gait training;Stair training;Functional mobility training;Therapeutic activities;Therapeutic exercise;Balance training;Patient/family education    PT Goals (Current goals can be found in the Care Plan section)  Acute Rehab PT Goals Patient Stated Goal: to have less pain PT Goal Formulation: With patient Time For Goal Achievement: 09/01/22 Potential to Achieve Goals: Good    Frequency Min 2X/week     Co-evaluation PT/OT/SLP Co-Evaluation/Treatment: Yes Reason for Co-Treatment: Necessary to address cognition/behavior during functional activity;For patient/therapist safety;To address functional/ADL transfers PT goals addressed during session: Mobility/safety with mobility;Balance OT goals addressed during session: ADL's and self-care;Strengthening/ROM       AM-PAC PT "6 Clicks" Mobility  Outcome Measure Help needed turning from your back to your side while in a flat bed without using bedrails?: A Lot Help needed moving from lying on your back to sitting on the side of a flat bed without using bedrails?: A Lot Help needed moving to and from a bed to a chair (including a wheelchair)?: A Lot Help needed standing up from a chair using your arms (e.g., wheelchair or bedside chair)?: A Lot Help needed to walk in hospital room?: Total Help needed climbing 3-5 steps with a railing? : Total 6 Click Score: 10    End of Session   Activity Tolerance: Patient limited by fatigue Patient left: in bed;with call bell/phone within reach;with bed alarm  set Nurse Communication: Mobility status PT Visit Diagnosis: Unsteadiness on feet (R26.81);Difficulty in walking, not elsewhere classified (R26.2)    Time: 9432-7614 PT Time Calculation (min) (ACUTE ONLY): 19  min   Charges:   PT Evaluation $PT Eval Low Complexity: 1 Low        Lieutenant Diego PT, DPT 1:31 PM,08/18/22

## 2022-08-19 ENCOUNTER — Inpatient Hospital Stay: Payer: Medicare PPO

## 2022-08-19 DIAGNOSIS — E871 Hypo-osmolality and hyponatremia: Secondary | ICD-10-CM | POA: Diagnosis not present

## 2022-08-19 LAB — BASIC METABOLIC PANEL
Anion gap: 5 (ref 5–15)
Anion gap: 6 (ref 5–15)
Anion gap: 6 (ref 5–15)
BUN: 15 mg/dL (ref 8–23)
BUN: 16 mg/dL (ref 8–23)
BUN: 18 mg/dL (ref 8–23)
CO2: 23 mmol/L (ref 22–32)
CO2: 24 mmol/L (ref 22–32)
CO2: 25 mmol/L (ref 22–32)
Calcium: 8.4 mg/dL — ABNORMAL LOW (ref 8.9–10.3)
Calcium: 8.6 mg/dL — ABNORMAL LOW (ref 8.9–10.3)
Calcium: 8.7 mg/dL — ABNORMAL LOW (ref 8.9–10.3)
Chloride: 100 mmol/L (ref 98–111)
Chloride: 101 mmol/L (ref 98–111)
Chloride: 98 mmol/L (ref 98–111)
Creatinine, Ser: 0.99 mg/dL (ref 0.44–1.00)
Creatinine, Ser: 1.01 mg/dL — ABNORMAL HIGH (ref 0.44–1.00)
Creatinine, Ser: 1.11 mg/dL — ABNORMAL HIGH (ref 0.44–1.00)
GFR, Estimated: 51 mL/min — ABNORMAL LOW (ref >60)
GFR, Estimated: 57 mL/min — ABNORMAL LOW (ref >60)
GFR, Estimated: 58 mL/min — ABNORMAL LOW (ref >60)
Glucose, Bld: 113 mg/dL — ABNORMAL HIGH (ref 70–99)
Glucose, Bld: 98 mg/dL (ref 70–99)
Glucose, Bld: 99 mg/dL (ref 70–99)
Potassium: 3.9 mmol/L (ref 3.5–5.1)
Potassium: 4 mmol/L (ref 3.5–5.1)
Potassium: 4 mmol/L (ref 3.5–5.1)
Sodium: 127 mmol/L — ABNORMAL LOW (ref 135–145)
Sodium: 129 mmol/L — ABNORMAL LOW (ref 135–145)
Sodium: 132 mmol/L — ABNORMAL LOW (ref 135–145)

## 2022-08-19 LAB — HEPATIC FUNCTION PANEL
ALT: 12 U/L (ref 0–44)
AST: 17 U/L (ref 15–41)
Albumin: 3 g/dL — ABNORMAL LOW (ref 3.5–5.0)
Alkaline Phosphatase: 41 U/L (ref 38–126)
Bilirubin, Direct: <0.1 mg/dL (ref 0.0–0.2)
Total Bilirubin: 0.7 mg/dL (ref 0.3–1.2)
Total Protein: 6.2 g/dL — ABNORMAL LOW (ref 6.5–8.1)

## 2022-08-19 LAB — CBC
HCT: 26.2 % — ABNORMAL LOW (ref 36.0–46.0)
Hemoglobin: 8.8 g/dL — ABNORMAL LOW (ref 12.0–15.0)
MCH: 30.1 pg (ref 26.0–34.0)
MCHC: 33.6 g/dL (ref 30.0–36.0)
MCV: 89.7 fL (ref 80.0–100.0)
Platelets: 200 10*3/uL (ref 150–400)
RBC: 2.92 MIL/uL — ABNORMAL LOW (ref 3.87–5.11)
RDW: 13.5 % (ref 11.5–15.5)
WBC: 11.8 10*3/uL — ABNORMAL HIGH (ref 4.0–10.5)
nRBC: 0 % (ref 0.0–0.2)

## 2022-08-19 LAB — MAGNESIUM: Magnesium: 1.8 mg/dL (ref 1.7–2.4)

## 2022-08-19 MED ORDER — SODIUM CHLORIDE 1 G PO TABS
1.0000 g | ORAL_TABLET | Freq: Three times a day (TID) | ORAL | Status: DC
  Administered 2022-08-19 – 2022-08-21 (×8): 1 g via ORAL
  Filled 2022-08-19 (×9): qty 1

## 2022-08-19 NOTE — Progress Notes (Signed)
PROGRESS NOTE    Cindy Huerta  KWI:[id number] DOB: Jul 08, 1946 DOA: 08/17/2022 PCP: Juluis Pitch, MD   Brief Narrative:   76 y.o. female with medical history significant of HTN, hyperlipidemia, TIA, GERD, hypertension, depression with anxiety, CKD-3b, GI bleeding, dCHF, hyponatremia, who presents with fall and dysuria.  In the ER patient was noted to be clinically dehydrated with hyponatremia.  CT head and cervical spine was negative.  X-ray of the left knee was also unremarkable.  Hyponatremia was initially suspected due to dehydration requiring IV fluids thereafter there was some component of SIADH therefore placed on fluid restriction    Assessment & Plan:  Principal Problem:   Hyponatremia Active Problems:   Acute renal failure superimposed on stage 3b chronic kidney disease (HCC)   UTI (urinary tract infection)   Chronic diastolic CHF (congestive heart failure) (HCC)   Elevated lactic acid level   HTN (hypertension)   Hypothyroidism   Iron deficiency anemia   Fall at home, initial encounter   Abnormal LFTs   HLD (hyperlipidemia)   History of TIA (transient ischemic attack)   Depression with anxiety     Assessment and Plan: * Hyponatremia; concerns for SIADH Admission sodium 125, this morning it is 129.  Continue hydration, salt tabs - increased to TID.  Urine sodium 67, urinalysis 471, serum osm 266. Fluid restriction ordered.   Acute renal failure superimposed on stage 3b chronic kidney disease ([place]) Recent baseline creatinine 0.98 on 07/30/1972.  Her creatinine is 1.65, improving. Cr now 1.11 -Hold Micardis  Hypokalemia - Repletion as needed  UTI (urinary tract infection) Follow Cultures, on IV Rocephin.   Left knee pain -XR is negative. But its excruciating therefore will order MRI Left knee AVOID NARCOTICS PER SON. Voltaren gel  Chronic diastolic CHF (congestive heart failure) ([place]) 2D echo 07/29/2021 showed EF of 55-60% with grade 2 diastolic dysfunction.   Patient does not have leg edema or JVD.  CHF is compensated.  No pulm edema chest x-ray.  Elevated lactic acid level Resolved.   HTN (hypertension) IV hydralazine as needed -Hold Micardis due to worsening renal function and hyponatremia -Continue home amlodipine, clonidine, metoprolol  Hypothyroidism - Synthroid  Iron deficiency anemia Hemoglobin stable at 12.9. -Continue ferrous sulfate  Fall at home, initial encounter Fall and questionable syncope: CT head negative.  CT of C-spine negative.  No focal neurodeficit on physical examination. PT/OT = SNF  Abnormal LFTs Mild abnormal liver function.  Recheck tomorrow morning.   HLD (hyperlipidemia) - Lipitor  History of TIA (transient ischemic attack) - Aspirin, Lipitor  Depression with anxiety - Continue home medications  PT/OT  DVT prophylaxis: Lovenox Code Status: DNR Family Communication:  Called son [name].   Status is: Inpatient Cont hospital stay for SIADH management, AKI and knee pain.  PT/OT has recommended SNF, TOC team working on placement.  Hopefully discharge in next 24-48 hours  Subjective: Has severe left knee pain with limited movement.  Right knee is ok.    Examination:  Constitutional: Not in acute distress Respiratory: Clear to auscultation bilaterally Cardiovascular: Normal sinus rhythm, no rubs Abdomen: Nontender nondistended good bowel sounds Musculoskeletal: No edema noted. Left knee is little more swelling. Flexion extension is normal in right knee, but very limited due to pain of her left knee.  Skin: No rashes seen Neurologic: CN 2-12 grossly intact.  And nonfocal Psychiatric: Very anxious.   Vitals:   08/18/22 0820 08/18/22 1510 08/18/22 2010 08/19/22 0346  BP: (!) 140/63 (!) 125/53 Marland Kitchen)  142/61 (!) 125/59  Pulse: 78 66 64 60  Resp: '18 19 18 18  '$ Temp: 98.7 F (37.1 C) 99.7 F (37.6 C) 97.7 F (36.5 C) 97.9 F (36.6 C)  TempSrc: Oral Oral    SpO2: 99% 99% 100% 98%  Weight:       Height:        Intake/Output Summary (Last 24 hours) at 08/19/2022 0739 Last data filed at 08/18/2022 1900 Gross per 24 hour  Intake 980 ml  Output --  Net 980 ml   Filed Weights   08/17/22 0800  Weight: 71.8 kg     Data Reviewed:   CBC: Recent Labs  Lab 08/17/22 0403 08/18/22 0530 08/19/22 0030  WBC 16.8* 12.7* 11.8*  NEUTROABS 13.7*  --   --   HGB 12.9 10.2* 8.8*  HCT 37.0 29.9* 26.2*  MCV 85.1 88.5 89.7  PLT 322 239 226   Basic Metabolic Panel: Recent Labs  Lab 08/17/22 0613 08/17/22 1607 08/17/22 2235 08/18/22 0530 08/18/22 1544 08/19/22 0021 08/19/22 0030  NA  --  126* 124* 129* 127* 127*  --   K  --  3.0* 3.1* 3.2* 4.1 4.0  --   CL  --  93* 93* 98 97* 98  --   CO2  --  '23 24 23 22 23  '$ --   GLUCOSE  --  115* 129* 121* 108* 113*  --   BUN  --  28* 28* 25* 21 18  --   CREATININE  --  1.68* 1.52* 1.29* 1.04* 1.11*  --   CALCIUM  --  8.8* 8.5* 8.4* 8.3* 8.4*  --   MG 1.8  --   --   --   --   --  1.8  PHOS  --   --   --  3.3  --   --   --    GFR: Estimated Creatinine Clearance: 42.4 mL/min (A) (by C-G formula based on SCr of 1.11 mg/dL (H)). Liver Function Tests: Recent Labs  Lab 08/17/22 0321 08/19/22 0030  AST 51* 17  ALT 22 12  ALKPHOS 57 41  BILITOT 1.1 0.7  PROT 9.2* 6.2*  ALBUMIN 4.6 3.0*   No results for input(s): "LIPASE", "AMYLASE" in the last 168 hours. No results for input(s): "AMMONIA" in the last 168 hours. Coagulation Profile: No results for input(s): "INR", "PROTIME" in the last 168 hours. Cardiac Enzymes: No results for input(s): "CKTOTAL", "CKMB", "CKMBINDEX", "TROPONINI" in the last 168 hours. BNP (last 3 results) No results for input(s): "PROBNP" in the last 8760 hours. HbA1C: No results for input(s): "HGBA1C" in the last 72 hours. CBG: No results for input(s): "GLUCAP" in the last 168 hours. Lipid Profile: No results for input(s): "CHOL", "HDL", "LDLCALC", "TRIG", "CHOLHDL", "LDLDIRECT" in the last 72 hours. Thyroid  Function Tests: No results for input(s): "TSH", "T4TOTAL", "FREET4", "T3FREE", "THYROIDAB" in the last 72 hours. Anemia Panel: No results for input(s): "VITAMINB12", "FOLATE", "FERRITIN", "TIBC", "IRON", "RETICCTPCT" in the last 72 hours. Sepsis Labs: Recent Labs  Lab 08/17/22 0321 08/17/22 0613 08/17/22 2235  PROCALCITON 0.34  --   --   LATICACIDVEN  --  3.0* 0.9    Recent Results (from the past 240 hour(s))  Urine Culture     Status: Abnormal   Collection Time: 08/17/22  2:46 AM   Specimen: Urine, Random  Result Value Ref Range Status   Specimen Description   Final    URINE, RANDOM Performed at [facility] [place]  [address] [place], [place] [address]    Special Requests   Final    NONE Performed at [facility], [address] [place], [place] [address]    Culture MULTIPLE SPECIES PRESENT, SUGGEST RECOLLECTION (A)  Final   Report Status 08/18/2022 FINAL  Final  SARS Coronavirus 2 by RT PCR (hospital order, performed in [facility] hospital lab) *cepheid single result test* Anterior Nasal Swab     Status: None   Collection Time: 08/17/22  5:47 AM   Specimen: Anterior Nasal Swab  Result Value Ref Range Status   SARS Coronavirus 2 by RT PCR NEGATIVE NEGATIVE Final    Comment: (NOTE) SARS-CoV-2 target nucleic acids are NOT DETECTED.  The SARS-CoV-2 RNA is generally detectable in upper and lower respiratory specimens during the acute phase of infection. The lowest concentration of SARS-CoV-2 viral copies this assay can detect is 250 copies / mL. A negative result does not preclude SARS-CoV-2 infection and should not be used as the sole basis for treatment or other patient management decisions.  A negative result may occur with improper specimen collection / handling, submission of specimen other than nasopharyngeal swab, presence of viral mutation(s) within the areas targeted by this assay, and inadequate number of viral copies (<250 copies / mL). A  negative result must be combined with clinical observations, patient history, and epidemiological information.  Fact Sheet for Patients:   https://www.patel.info/  Fact Sheet for Healthcare Providers: https://hall.com/  This test is not yet approved or  cleared by the [place] FDA and has been authorized for detection and/or diagnosis of SARS-CoV-2 by FDA under an Emergency Use Authorization (EUA).  This EUA will remain in effect (meaning this test can be used) for the duration of the COVID-19 declaration under Section 564(b)(1) of the Act, 21 U.S.C. section 360bbb-3(b)(1), unless the authorization is terminated or revoked sooner.  Performed at [facility], [address] [place], [place] [address]   Blood culture (routine x 2)     Status: None (Preliminary result)   Collection Time: 08/17/22  6:13 AM   Specimen: BLOOD RIGHT HAND  Result Value Ref Range Status   Specimen Description BLOOD RIGHT HAND  Final   Special Requests   Final    BOTTLES DRAWN AEROBIC AND ANAEROBIC Blood Culture adequate volume   Culture   Final    NO GROWTH 2 DAYS Performed at [facility], [address] [place], [place] [address]    Report Status PENDING  Incomplete  Blood culture (routine x 2)     Status: None (Preliminary result)   Collection Time: 08/17/22  6:13 AM   Specimen: BLOOD RIGHT ARM  Result Value Ref Range Status   Specimen Description BLOOD RIGHT ARM  Final   Special Requests   Final    BOTTLES DRAWN AEROBIC AND ANAEROBIC Blood Culture results may not be optimal due to an inadequate volume of blood received in culture bottles   Culture   Final    NO GROWTH 2 DAYS Performed at [facility], [address] [place], [place] [address]    Report Status PENDING  Incomplete         Radiology Studies: IR Fluoro Guide CV Line Left  Result Date: 08/17/2022 INDICATION: 76 year old woman with sepsis and  poor venous access presents to IR for central line placement. EXAM: 1. Ultrasound-guided access of right internal jugular vein. 2. Ultrasound-guided access of left internal jugular vein. 3. Left internal jugular vein non tunneled triple-lumen  central line placement. MEDICATIONS: None ANESTHESIA/SEDATION: None FLUOROSCOPY: Radiation Exposure Index (as provided by the fluoroscopic device): 84 mGy Kerma COMPLICATIONS: None immediate. PROCEDURE: Informed written consent was obtained from the patient after a thorough discussion of the procedural risks, benefits and alternatives. All questions were addressed. Maximal Sterile Barrier Technique was utilized including caps, mask, sterile gowns, sterile gloves, sterile drape, hand hygiene and skin antiseptic. A timeout was performed prior to the initiation of the procedure. Ultrasound image documenting patency of the right internal jugular vein was obtained and placed in permanent medical record. Sterile ultrasound probe cover and gel utilized throughout the procedure. Utilizing continuous ultrasound guidance, the right internal jugular vein was accessed with a 21 gauge needle. 0.018 inch Nitrex guidewire could not be successfully advanced centrally. The right jugular vein was accessed 2 additional times utilizing continuous ultrasound guidance a 21 gauge needle, however the Nitrex wire could not be advanced centrally. Decision was made to place a left-sided central venous catheter. Left neck skin prepped and draped in usual fashion. Ultrasound image documenting patency of the left internal jugular vein was obtained and placed in permanent medical record. Sterile ultrasound probe cover and gel utilized throughout the procedure. Utilizing continuous ultrasound guidance, the left internal jugular vein was accessed with a 21 gauge needle. 21 gauge needle exchanged for a transitional dilator set over 0.018 inch guidewire. 0.035 inch guidewire advanced through the 5 French dilator  to the level of the inferior vena cava. Tract dilation was performed and triple-lumen non tunneled central venous catheter was inserted over the guidewire. The tip was positioned at the cavoatrial junction. All lumens aspirated and flushed well. The catheter was secured to skin with suture and covered with sterile dressing. IMPRESSION: 1. Left IJ non tunneled triple-lumen central venous catheter is ready for use. 2. 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 Electronically Signed   By: Miachel Roux M.D.   On: 08/17/2022 15:16   IR US Guide Vasc Access Left  Result Date: 08/17/2022 INDICATION: 76 year old woman with sepsis and poor venous access presents to IR for central line placement. EXAM: 1. Ultrasound-guided access of right internal jugular vein. 2. Ultrasound-guided access of left internal jugular vein. 3. Left internal jugular vein non tunneled triple-lumen central line placement. MEDICATIONS: None ANESTHESIA/SEDATION: None FLUOROSCOPY: Radiation Exposure Index (as provided by the fluoroscopic device): 84 mGy Kerma COMPLICATIONS: None immediate. PROCEDURE: Informed written consent was obtained from the patient after a thorough discussion of the procedural risks, benefits and alternatives. All questions were addressed. Maximal Sterile Barrier Technique was utilized including caps, mask, sterile gowns, sterile gloves, sterile drape, hand hygiene and skin antiseptic. A timeout was performed prior to the initiation of the procedure. Ultrasound image documenting patency of the right internal jugular vein was obtained and placed in permanent medical record. Sterile ultrasound probe cover and gel utilized throughout the procedure. Utilizing continuous ultrasound guidance, the right internal jugular vein was accessed with a 21 gauge needle. 0.018 inch Nitrex guidewire could not be successfully advanced centrally.  The right jugular vein was accessed 2 additional times utilizing continuous ultrasound guidance a 21 gauge needle, however the Nitrex wire could not be advanced centrally. Decision was made to place a left-sided central venous catheter. Left neck skin prepped and draped in usual fashion. Ultrasound image documenting patency of the left internal jugular vein was obtained and placed in permanent medical record. Sterile ultrasound probe cover  and gel utilized throughout the procedure. Utilizing continuous ultrasound guidance, the left internal jugular vein was accessed with a 21 gauge needle. 21 gauge needle exchanged for a transitional dilator set over 0.018 inch guidewire. 0.035 inch guidewire advanced through the 5 French dilator to the level of the inferior vena cava. Tract dilation was performed and triple-lumen non tunneled central venous catheter was inserted over the guidewire. The tip was positioned at the cavoatrial junction. All lumens aspirated and flushed well. The catheter was secured to skin with suture and covered with sterile dressing. IMPRESSION: 1. Left IJ non tunneled triple-lumen central venous catheter is ready for use. 2. 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 Electronically Signed   By: Miachel Roux M.D.   On: 08/17/2022 15:16   IR US Guide Vasc Access Right  Result Date: 08/17/2022 INDICATION: 76 year old woman with sepsis and poor venous access presents to IR for central line placement. EXAM: 1. Ultrasound-guided access of right internal jugular vein. 2. Ultrasound-guided access of left internal jugular vein. 3. Left internal jugular vein non tunneled triple-lumen central line placement. MEDICATIONS: None ANESTHESIA/SEDATION: None FLUOROSCOPY: Radiation Exposure Index (as provided by the fluoroscopic device): 84 mGy Kerma COMPLICATIONS: None immediate. PROCEDURE: Informed written consent  was obtained from the patient after a thorough discussion of the procedural risks, benefits and alternatives. All questions were addressed. Maximal Sterile Barrier Technique was utilized including caps, mask, sterile gowns, sterile gloves, sterile drape, hand hygiene and skin antiseptic. A timeout was performed prior to the initiation of the procedure. Ultrasound image documenting patency of the right internal jugular vein was obtained and placed in permanent medical record. Sterile ultrasound probe cover and gel utilized throughout the procedure. Utilizing continuous ultrasound guidance, the right internal jugular vein was accessed with a 21 gauge needle. 0.018 inch Nitrex guidewire could not be successfully advanced centrally. The right jugular vein was accessed 2 additional times utilizing continuous ultrasound guidance a 21 gauge needle, however the Nitrex wire could not be advanced centrally. Decision was made to place a left-sided central venous catheter. Left neck skin prepped and draped in usual fashion. Ultrasound image documenting patency of the left internal jugular vein was obtained and placed in permanent medical record. Sterile ultrasound probe cover and gel utilized throughout the procedure. Utilizing continuous ultrasound guidance, the left internal jugular vein was accessed with a 21 gauge needle. 21 gauge needle exchanged for a transitional dilator set over 0.018 inch guidewire. 0.035 inch guidewire advanced through the 5 French dilator to the level of the inferior vena cava. Tract dilation was performed and triple-lumen non tunneled central venous catheter was inserted over the guidewire. The tip was positioned at the cavoatrial junction. All lumens aspirated and flushed well. The catheter was secured to skin with suture and covered with sterile dressing. IMPRESSION: 1. Left IJ non tunneled triple-lumen central venous catheter is ready for use. 2. 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 Electronically Signed   By: Miachel Roux M.D.   On: 08/17/2022 15:16   [place] EKG SITE RITE  Result Date: 08/17/2022 If Site Rite image not attached, placement could not be confirmed due to current cardiac rhythm.       Scheduled Meds:  amLODipine  5 mg Oral Daily   aspirin EC  81 mg Oral Daily  bisacodyl  10 mg Rectal Q1200   Chlorhexidine Gluconate Cloth  6 each Topical Daily   cholecalciferol  5,000 Units Oral Daily   cloNIDine  0.1 mg Oral BID   diclofenac Sodium  4 g Topical QID   docusate sodium  100 mg Oral BID   enoxaparin (LOVENOX) injection  40 mg Subcutaneous Q24H   ferrous sulfate  325 mg Oral Q breakfast   FLUoxetine  20 mg Oral Daily   levothyroxine  75 mcg Oral QAC breakfast   LORazepam  0.5 mg Oral Daily   magnesium oxide  400 mg Oral Daily   metoprolol succinate  50 mg Oral BID   multivitamin with minerals  1 tablet Oral Daily   Muscle Rub  1 Application Topical BID   pantoprazole  40 mg Oral Daily   sodium chloride  1 g Oral TID WC   Continuous Infusions:  cefTRIAXone (ROCEPHIN)  IV 1 g (08/18/22 1010)   promethazine (PHENERGAN) injection (IM or IVPB) 12.5 mg (08/18/22 0012)     LOS: 2 days   Time spent= 35 mins    Chanequa Spees Arsenio Loader, MD [facility]  If 7PM-7AM, please contact night-coverage  08/19/2022, 7:39 AM

## 2022-08-19 NOTE — Progress Notes (Signed)
Spoke with Admissions director Toma Copier at [facility]. Brittney inquired about why patient was taking Ativan.   11:40am Message left for [place] advising reasoning for Ativan administration per Attending MD, Dr. Gerlean Ren medications is being given related to anxiousness from pain when ambulating.

## 2022-08-19 NOTE — Progress Notes (Signed)
   08/19/22 1100  Clinical Encounter Type  Visited With Patient  Visit Type Follow-up;Spiritual support  Referral From Nurse  Consult/Referral To Chaplain  Spiritual Encounters  Spiritual Needs Prayer;Emotional   Chaplain responded to patient request for a visit. Patient expressed spiritual conflict and need for assurance. Together we recited familiar hymns, quoted familiar Scriptures and prayed together which provided comfort during a time of anxiety.

## 2022-08-20 DIAGNOSIS — E871 Hypo-osmolality and hyponatremia: Secondary | ICD-10-CM | POA: Diagnosis not present

## 2022-08-20 LAB — BASIC METABOLIC PANEL
Anion gap: 7 (ref 5–15)
Anion gap: 4 — ABNORMAL LOW (ref 5–15)
BUN: 13 mg/dL (ref 8–23)
BUN: 16 mg/dL (ref 8–23)
CO2: 22 mmol/L (ref 22–32)
CO2: 27 mmol/L (ref 22–32)
Calcium: 8.5 mg/dL — ABNORMAL LOW (ref 8.9–10.3)
Calcium: 8.7 mg/dL — ABNORMAL LOW (ref 8.9–10.3)
Chloride: 100 mmol/L (ref 98–111)
Chloride: 100 mmol/L (ref 98–111)
Creatinine, Ser: 1.02 mg/dL — ABNORMAL HIGH (ref 0.44–1.00)
Creatinine, Ser: 1.11 mg/dL — ABNORMAL HIGH (ref 0.44–1.00)
GFR, Estimated: 56 mL/min — ABNORMAL LOW (ref >60)
GFR, Estimated: 51 mL/min — ABNORMAL LOW (ref >60)
Glucose, Bld: 109 mg/dL — ABNORMAL HIGH (ref 70–99)
Glucose, Bld: 96 mg/dL (ref 70–99)
Potassium: 3.7 mmol/L (ref 3.5–5.1)
Potassium: 4.2 mmol/L (ref 3.5–5.1)
Sodium: 129 mmol/L — ABNORMAL LOW (ref 135–145)
Sodium: 131 mmol/L — ABNORMAL LOW (ref 135–145)

## 2022-08-20 LAB — CBC
HCT: 26.3 % — ABNORMAL LOW (ref 36.0–46.0)
Hemoglobin: 8.7 g/dL — ABNORMAL LOW (ref 12.0–15.0)
MCH: 30 pg (ref 26.0–34.0)
MCHC: 33.1 g/dL (ref 30.0–36.0)
MCV: 90.7 fL (ref 80.0–100.0)
Platelets: 214 10*3/uL (ref 150–400)
RBC: 2.9 MIL/uL — ABNORMAL LOW (ref 3.87–5.11)
RDW: 13.5 % (ref 11.5–15.5)
WBC: 10.5 10*3/uL (ref 4.0–10.5)
nRBC: 0 % (ref 0.0–0.2)

## 2022-08-20 LAB — MAGNESIUM: Magnesium: 1.7 mg/dL (ref 1.7–2.4)

## 2022-08-20 LAB — BRAIN NATRIURETIC PEPTIDE: B Natriuretic Peptide: 49.4 pg/mL (ref 0.0–100.0)

## 2022-08-20 MED ORDER — SODIUM CHLORIDE 0.9% FLUSH: 10.0000 mL | INTRAVENOUS | Status: DC | PRN

## 2022-08-20 MED ORDER — TRAMADOL HCL 50 MG PO TABS
50.0000 mg | ORAL_TABLET | Freq: Three times a day (TID) | ORAL | Status: DC | PRN
  Administered 2022-08-20 – 2022-08-24 (×4): 50 mg via ORAL
  Filled 2022-08-20 (×4): qty 1

## 2022-08-20 MED ORDER — SODIUM CHLORIDE 0.9% FLUSH
10.0000 mL | Freq: Two times a day (BID) | INTRAVENOUS | Status: DC
  Administered 2022-08-20 – 2022-08-21 (×3): 10 mL
  Administered 2022-08-22: 30 mL
  Administered 2022-08-23: 10 mL
  Administered 2022-08-23: 15 mL
  Administered 2022-08-24: 30 mL

## 2022-08-20 NOTE — TOC Progression Note (Signed)
Transition of Care [facility]) - Progression Note    Patient Details  Name: Cindy Huerta MRN: [id number] Date of Birth: Sep 09, 1946  Transition [facility]) CM/SW Contact  Laurena Slimmer, RN Phone Number: 08/20/2022, 4:07 PM  Clinical Narrative:    Attempt to reach family at bedside. No family present in room.  Call placed to patient's son Remo Lipps to give bed offers for [facility], [facility] and [facility]. Son interested in [facility]. Advised about note in hub stating family discussion needs to take place. Patient son stated he would call facility.         Expected Discharge Plan and Services                                                 Social Determinants of Health (SDOH) Interventions    Readmission Risk Interventions    06/20/2022    1:01 PM 11/25/2021    1:15 PM 10/10/2021    1:24 PM  Readmission Risk Prevention Plan  Transportation Screening  Complete Complete  PCP or Specialist Appt within 5-7 Days   Complete  PCP or Specialist Appt within 3-5 Days Complete Complete   Home Care Screening   Complete  Medication Review (RN CM)   Complete  HRI or Home Care Consult  Complete   Social Work Consult for [place] Planning/Counseling Complete Complete   Palliative Care Screening Not Applicable Not Applicable   Medication Review [employer])  Complete

## 2022-08-20 NOTE — Progress Notes (Signed)
Physical Therapy Treatment Patient Details Name: Cindy Huerta MRN: [id number] DOB: 06-22-1946 Today's Date: 08/20/2022   History of Present Illness Pt is a 76 year old female presenting following fall and dysruia. PMH significant for HTN, hyperlipidemia, TIA, GERD, hypertension, depression with anxiety, CKD-3b, GI bleeding, dCHF, hyponatremia. Further workup showed "nondisplaced subchondral fracture of the  posterolateral tibial plateau, Intrasubstance degeneration of the medial meniscus with probable  nondisplaced degenerative tear of the meniscal body."    PT Comments    Patient alert, oriented to self, premedicated prior to session. Pt did endorse feeling "whoozy" with mobility today and not quite like herself. Seen by PT/OT to maximize function and safety. Supine to sit with maxA, cued for step by step sequencing. Sit <> stand three times with maxAx2 and RW, and constant cues to maximize safety/hand placement. 3rd attempt pt able to take a few steps towards HOB, maxAx2 and dependent on PT to move RW. Returned to supine maxAx2 and left with OT at bedside. The patient would benefit from further skilled PT intervention to continue to progress towards goals. Recommendation remains appropriate.       Recommendations for follow up therapy are one component of a multi-disciplinary discharge planning process, led by the attending physician.  Recommendations may be updated based on patient status, additional functional criteria and insurance authorization.  Follow Up Recommendations  Skilled nursing-short term rehab (<3 hours/day) Can patient physically be transported by private vehicle: No   Assistance Recommended at Discharge    Patient can return home with the following Two people to help with walking and/or transfers;Two people to help with bathing/dressing/bathroom;Direct supervision/assist for medications management;Help with stairs or ramp for entrance;Assist for transportation;Assistance  with cooking/housework;Direct supervision/assist for financial management   Equipment Recommendations  Other (comment) (TBD)    Recommendations for Other Services       Precautions / Restrictions Precautions Precautions: Fall Required Braces or Orthoses: Knee Immobilizer - Left Knee Immobilizer - Left: On at all times Restrictions Weight Bearing Restrictions: Yes LLE Weight Bearing: Partial weight bearing Other Position/Activity Restrictions: "Ice and gentle passive range of motion as tolerated." Per Dr. Sabra Heck     Mobility  Bed Mobility Overal bed mobility: Needs Assistance Bed Mobility: Supine to Sit, Sit to Supine     Supine to sit: Max assist, +2 for safety/equipment Sit to supine: Max assist, +2 for physical assistance        Transfers Overall transfer level: Needs assistance Equipment used: Rolling walker (2 wheels) Transfers: Sit to/from Stand Sit to Stand: Mod assist, Max assist, From elevated surface, +2 safety/equipment           General transfer comment: cues for off weighting LLE as able    Ambulation/Gait Ambulation/Gait assistance: Max assist, +2 physical assistance Gait Distance (Feet): 2 Feet Assistive device: Rolling walker (2 wheels)         General Gait Details: 2-3 side steps at EOB with maxAx2   Stairs             Wheelchair Mobility    Modified Rankin (Stroke Patients Only)       Balance Overall balance assessment: Needs assistance Sitting-balance support: Feet supported Sitting balance-Leahy Scale: Fair   Postural control: Posterior lean Standing balance support: Reliant on assistive device for balance Standing balance-Leahy Scale: Poor                              Cognition Arousal/Alertness: Awake/alert  Behavior During Therapy: WFL for tasks assessed/performed Overall Cognitive Status: No family/caregiver present to determine baseline cognitive functioning Area of Impairment: Attention, Following  commands, Safety/judgement, Awareness, Problem solving                   Current Attention Level: Sustained   Following Commands: Follows one step commands with increased time Safety/Judgement: Decreased awareness of deficits Awareness: Emergent Problem Solving: Slow processing, Decreased initiation, Difficulty sequencing, Requires verbal cues, Requires tactile cues          Exercises      General Comments        Pertinent Vitals/Pain Pain Assessment Pain Assessment: Faces Faces Pain Scale: Hurts a little bit    Home Living                          Prior Function            PT Goals (current goals can now be found in the care plan section) Progress towards PT goals: Progressing toward goals    Frequency    Min 2X/week      PT Plan Current plan remains appropriate    Co-evaluation PT/OT/SLP Co-Evaluation/Treatment: Yes Reason for Co-Treatment: For patient/therapist safety;To address functional/ADL transfers PT goals addressed during session: Mobility/safety with mobility;Balance;Proper use of DME OT goals addressed during session: ADL's and self-care;Strengthening/ROM;Proper use of Adaptive equipment and DME      AM-PAC PT "6 Clicks" Mobility   Outcome Measure  Help needed turning from your back to your side while in a flat bed without using bedrails?: A Lot Help needed moving from lying on your back to sitting on the side of a flat bed without using bedrails?: A Lot Help needed moving to and from a bed to a chair (including a wheelchair)?: A Lot Help needed standing up from a chair using your arms (e.g., wheelchair or bedside chair)?: A Lot Help needed to walk in hospital room?: Total Help needed climbing 3-5 steps with a railing? : Total 6 Click Score: 10    End of Session Equipment Utilized During Treatment: Gait belt Activity Tolerance: Patient tolerated treatment well Patient left: in bed;with call bell/phone within reach;Other  (comment) (with OT) Nurse Communication: Mobility status PT Visit Diagnosis: Unsteadiness on feet (R26.81);Difficulty in walking, not elsewhere classified (R26.2)     Time: 4536-4680 PT Time Calculation (min) (ACUTE ONLY): 21 min  Charges:  $Therapeutic Exercise: 8-22 mins                     Lieutenant Diego PT, DPT 3:49 PM,08/20/22

## 2022-08-20 NOTE — Progress Notes (Signed)
PROGRESS NOTE    Cindy Huerta  YNW:[id number] DOB: 12-12-1945 DOA: 08/17/2022 PCP: Juluis Pitch, MD   Brief Narrative:   76 y.o. female with medical history significant of HTN, hyperlipidemia, TIA, GERD, hypertension, depression with anxiety, CKD-3b, GI bleeding, dCHF, hyponatremia, who presents with fall and dysuria.  In the ER patient was noted to be clinically dehydrated with hyponatremia.  CT head and cervical spine was negative.  X-ray of the left knee was also unremarkable.  Hyponatremia was initially suspected due to dehydration requiring IV fluids thereafter there was some component of SIADH therefore placed on fluid restriction She is also reporting of significant left knee pain, x-ray negative.  Due to swelling, MRI of the knee performed showed nondisplaced subchondral fracture, large joint effusion, severe arthritis.  Orthopedic team was consulted for their input.    Assessment & Plan:  Principal Problem:   Hyponatremia Active Problems:   Acute renal failure superimposed on stage 3b chronic kidney disease (HCC)   UTI (urinary tract infection)   Chronic diastolic CHF (congestive heart failure) (HCC)   Elevated lactic acid level   HTN (hypertension)   Hypothyroidism   Iron deficiency anemia   Fall at home, initial encounter   Abnormal LFTs   HLD (hyperlipidemia)   History of TIA (transient ischemic attack)   Depression with anxiety     Assessment and Plan: * Hyponatremia; concerns for SIADH Admission sodium 125, this morning it is 129.  Continue hydration, salt tabs - increased to TID.  Urine sodium 67, urinalysis 471, serum osm 266. Fluid restriction ordered.   Acute renal failure superimposed on stage 3b chronic kidney disease ([place]) Recent baseline creatinine 0.98 on 07/30/1972.  Her creatinine is 1.65, improving. Cr now 1.11 -Hold Micardis  Hypokalemia - Repletion as needed  UTI (urinary tract infection) Cultures are now negative, patient asymptomatic.   Completed 3-day course of cefepime/Rocephin.  Left knee pain and swelling -XR is negative.  Avoid narcotics per son.  Voltaren gel ordered -MRI left knee done-MRI of the knee performed showed nondisplaced subchondral fracture, large joint effusion, severe arthritis.  Orthopedic team was consulted for their input. Dr Sabra Heck from orthopedic consulted.  -Immobilizer, Ace wrap and ice has been ordered  Chronic diastolic CHF (congestive heart failure) ([place]) 2D echo 07/29/2021 showed EF of 55-60% with grade 2 diastolic dysfunction.  Patient does not have leg edema or JVD.  CHF is compensated.  No pulm edema chest x-ray.  Elevated lactic acid level Resolved.   HTN (hypertension) IV hydralazine as needed -Hold Micardis-initially due to worsening renal function -Continue home amlodipine, clonidine, metoprolol  Hypothyroidism - Synthroid  Iron deficiency anemia Today 8.7, no obvious signs of blood loss.  This is probably close to her baseline -Continue ferrous sulfate  Fall at home, initial encounter Fall and questionable syncope: CT head negative.  CT of C-spine negative.  No focal neurodeficit on physical examination. PT/OT = SNF  Abnormal LFTs Resolved  HLD (hyperlipidemia) - Lipitor  History of TIA (transient ischemic attack) - Aspirin, Lipitor  Depression with anxiety - Continue home medications  I will discuss with RN regarding removing central line  PT/OT  DVT prophylaxis: Lovenox Code Status: DNR Family Communication: both the sons at bedside  Status is: Inpatient Orthopedic evaluation for her left knee pain there after placement  Subjective: Overall feels better but still has significant left knee pain with limited mobility Examination: HEENT constitutional: Not in acute distress Respiratory: Clear to auscultation bilaterally Cardiovascular: Normal sinus rhythm, no  rubs Abdomen: Nontender nondistended good bowel sounds Musculoskeletal: Swelling of the left  knee compared to right with limited range of motion Skin: No rashes seen Neurologic: CN 2-12 grossly intact.  And nonfocal Psychiatric: Normal judgment and insight. Alert and oriented x 3. Normal mood. Left neck central line noted  Vitals:   08/19/22 1651 08/19/22 2017 08/20/22 0011 08/20/22 0500  BP: 137/60 (!) 129/45 135/65 118/68  Pulse: 65 62 66 69  Resp: '18 20 17 16  '$ Temp: 98.3 F (36.8 C) 98 F (36.7 C) 98.4 F (36.9 C) 98.2 F (36.8 C)  TempSrc:   Oral Oral  SpO2: 100% 99% 99% 100%  Weight:    73.3 kg  Height:        Intake/Output Summary (Last 24 hours) at 08/20/2022 0724 Last data filed at 08/19/2022 1900 Gross per 24 hour  Intake 500 ml  Output --  Net 500 ml   Filed Weights   08/17/22 0800 08/20/22 0500  Weight: 71.8 kg 73.3 kg     Data Reviewed:   CBC: Recent Labs  Lab 08/17/22 0403 08/18/22 0530 08/19/22 0030 08/20/22 0530  WBC 16.8* 12.7* 11.8* 10.5  NEUTROABS 13.7*  --   --   --   HGB 12.9 10.2* 8.8* 8.7*  HCT 37.0 29.9* 26.2* 26.3*  MCV 85.1 88.5 89.7 90.7  PLT 322 239 200 144   Basic Metabolic Panel: Recent Labs  Lab 08/17/22 0613 08/17/22 1607 08/18/22 0530 08/18/22 1544 08/19/22 0021 08/19/22 0030 08/19/22 0847 08/19/22 1621 08/20/22 0021 08/20/22 0530  NA  --    < > 129* 127* 127*  --  129* 132* 129*  --   K  --    < > 3.2* 4.1 4.0  --  4.0 3.9 3.7  --   CL  --    < > 98 97* 98  --  100 101 100  --   CO2  --    < > '23 22 23  '$ --  '24 25 22  '$ --   GLUCOSE  --    < > 121* 108* 113*  --  98 99 109*  --   BUN  --    < > 25* 21 18  --  '16 15 13  '$ --   CREATININE  --    < > 1.29* 1.04* 1.11*  --  1.01* 0.99 1.02*  --   CALCIUM  --    < > 8.4* 8.3* 8.4*  --  8.6* 8.7* 8.5*  --   MG 1.8  --   --   --   --  1.8  --   --   --  1.7  PHOS  --   --  3.3  --   --   --   --   --   --   --    < > = values in this interval not displayed.   GFR: Estimated Creatinine Clearance: 46.6 mL/min (A) (by C-G formula based on SCr of 1.02 mg/dL  (H)). Liver Function Tests: Recent Labs  Lab 08/17/22 0321 08/19/22 0030  AST 51* 17  ALT 22 12  ALKPHOS 57 41  BILITOT 1.1 0.7  PROT 9.2* 6.2*  ALBUMIN 4.6 3.0*   No results for input(s): "LIPASE", "AMYLASE" in the last 168 hours. No results for input(s): "AMMONIA" in the last 168 hours. Coagulation Profile: No results for input(s): "INR", "PROTIME" in the last 168 hours. Cardiac Enzymes: No results for input(s): "CKTOTAL", "CKMB", "CKMBINDEX", "  TROPONINI" in the last 168 hours. BNP (last 3 results) No results for input(s): "PROBNP" in the last 8760 hours. HbA1C: No results for input(s): "HGBA1C" in the last 72 hours. CBG: No results for input(s): "GLUCAP" in the last 168 hours. Lipid Profile: No results for input(s): "CHOL", "HDL", "LDLCALC", "TRIG", "CHOLHDL", "LDLDIRECT" in the last 72 hours. Thyroid Function Tests: No results for input(s): "TSH", "T4TOTAL", "FREET4", "T3FREE", "THYROIDAB" in the last 72 hours. Anemia Panel: No results for input(s): "VITAMINB12", "FOLATE", "FERRITIN", "TIBC", "IRON", "RETICCTPCT" in the last 72 hours. Sepsis Labs: Recent Labs  Lab 08/17/22 0321 08/17/22 0613 08/17/22 2235  PROCALCITON 0.34  --   --   LATICACIDVEN  --  3.0* 0.9    Recent Results (from the past 240 hour(s))  Urine Culture     Status: Abnormal   Collection Time: 08/17/22  2:46 AM   Specimen: Urine, Random  Result Value Ref Range Status   Specimen Description   Final    URINE, RANDOM Performed at [facility], [address] [place], [place] [address]    Special Requests   Final    NONE Performed at [facility], [address] [place], [place] [address]    Culture MULTIPLE SPECIES PRESENT, SUGGEST RECOLLECTION (A)  Final   Report Status 08/18/2022 FINAL  Final  SARS Coronavirus 2 by RT PCR (hospital order, performed in [facility] hospital lab) *cepheid single result test* Anterior Nasal Swab     Status: None   Collection Time: 08/17/22   5:47 AM   Specimen: Anterior Nasal Swab  Result Value Ref Range Status   SARS Coronavirus 2 by RT PCR NEGATIVE NEGATIVE Final    Comment: (NOTE) SARS-CoV-2 target nucleic acids are NOT DETECTED.  The SARS-CoV-2 RNA is generally detectable in upper and lower respiratory specimens during the acute phase of infection. The lowest concentration of SARS-CoV-2 viral copies this assay can detect is 250 copies / mL. A negative result does not preclude SARS-CoV-2 infection and should not be used as the sole basis for treatment or other patient management decisions.  A negative result may occur with improper specimen collection / handling, submission of specimen other than nasopharyngeal swab, presence of viral mutation(s) within the areas targeted by this assay, and inadequate number of viral copies (<250 copies / mL). A negative result must be combined with clinical observations, patient history, and epidemiological information.  Fact Sheet for Patients:   https://www.patel.info/  Fact Sheet for Healthcare Providers: https://hall.com/  This test is not yet approved or  cleared by the [place] FDA and has been authorized for detection and/or diagnosis of SARS-CoV-2 by FDA under an Emergency Use Authorization (EUA).  This EUA will remain in effect (meaning this test can be used) for the duration of the COVID-19 declaration under Section 564(b)(1) of the Act, 21 U.S.C. section 360bbb-3(b)(1), unless the authorization is terminated or revoked sooner.  Performed at [facility], [address] [place], [place] [address]   Blood culture (routine x 2)     Status: None (Preliminary result)   Collection Time: 08/17/22  6:13 AM   Specimen: BLOOD RIGHT HAND  Result Value Ref Range Status   Specimen Description BLOOD RIGHT HAND  Final   Special Requests   Final    BOTTLES DRAWN AEROBIC AND ANAEROBIC Blood Culture adequate volume    Culture   Final    NO GROWTH 3 DAYS Performed at [facility], [address] [place], [place] [address]  Report Status PENDING  Incomplete  Blood culture (routine x 2)     Status: None (Preliminary result)   Collection Time: 08/17/22  6:13 AM   Specimen: BLOOD RIGHT ARM  Result Value Ref Range Status   Specimen Description BLOOD RIGHT ARM  Final   Special Requests   Final    BOTTLES DRAWN AEROBIC AND ANAEROBIC Blood Culture results may not be optimal due to an inadequate volume of blood received in culture bottles   Culture   Final    NO GROWTH 3 DAYS Performed at [facility], [address], [place], [place] [address]    Report Status PENDING  Incomplete         Radiology Studies: No results found.      Scheduled Meds:  amLODipine  5 mg Oral Daily   aspirin EC  81 mg Oral Daily   bisacodyl  10 mg Rectal Q1200   Chlorhexidine Gluconate Cloth  6 each Topical Daily   cholecalciferol  5,000 Units Oral Daily   cloNIDine  0.1 mg Oral BID   diclofenac Sodium  4 g Topical QID   docusate sodium  100 mg Oral BID   enoxaparin (LOVENOX) injection  40 mg Subcutaneous Q24H   ferrous sulfate  325 mg Oral Q breakfast   FLUoxetine  20 mg Oral Daily   levothyroxine  75 mcg Oral QAC breakfast   LORazepam  0.5 mg Oral Daily   magnesium oxide  400 mg Oral Daily   metoprolol succinate  50 mg Oral BID   multivitamin with minerals  1 tablet Oral Daily   Muscle Rub  1 Application Topical BID   pantoprazole  40 mg Oral Daily   sodium chloride  1 g Oral TID WC   Continuous Infusions:  promethazine (PHENERGAN) injection (IM or IVPB) 12.5 mg (08/18/22 0012)     LOS: 3 days   Time spent= 35 mins    Keonta Monceaux Arsenio Loader, MD [facility]  If 7PM-7AM, please contact night-coverage  08/20/2022, 7:24 AM

## 2022-08-20 NOTE — Progress Notes (Signed)
OT Cancellation Note  Patient Details Name: Cindy Huerta MRN: [id number] DOB: Oct 08, 1946   Cancelled Treatment:    Reason Eval/Treat Not Completed: Patient not medically ready;Other (comment)MRI shows Nondisplaced subchondral fracture of the posterior aspect of the lateral tibial plateau, Intrasubstance degeneration of the medial meniscus with probable nondisplaced degenerative tear of the meniscal body. OT will hold for now per RN request, will reattempt as able and medically appropriate.   Shanon Payor, OTD OTR/L  08/20/22, 8:33 AM

## 2022-08-20 NOTE — Progress Notes (Signed)
Occupational Therapy Treatment Patient Details Name: Cindy Huerta MRN: [id number] DOB: Feb 24, 1946 Today's Date: 08/20/2022   History of present illness Pt is a 76 year old female presenting following fall and dysruia. PMH significant for HTN, hyperlipidemia, TIA, GERD, hypertension, depression with anxiety, CKD-3b, GI bleeding, dCHF, hyponatremia. Further workup showed "nondisplaced subchondral fracture of the  posterolateral tibial plateau, Intrasubstance degeneration of the medial meniscus with probable  nondisplaced degenerative tear of the meniscal body."   OT comments  Chart reviewed, RN cleared pt for participation in OT tx session. Pt is oriented to self, place, situation, not oriented to date. Pt requires increased time for processing and has poor safety awareness and awareness of deficits. Pt is pre medicated prior to session with KI on throughout. Co tx with PT completed on this date. Tx session targeted progressing activity tolerance/functional mobility during ADL tasks in preparation for improved ADL completion. Improvements noted in activity tolerance during mobility with pt able to perform STS 3x with MAX A, frequent vcs for technique/weight bearing. LB dressing completed with MAX A. Grooming tasks completed with MIN A. Pt is making progress towards goal acquisition, would benefit from STR to address functional deficits. OT will follow acutely.    Recommendations for follow up therapy are one component of a multi-disciplinary discharge planning process, led by the attending physician.  Recommendations may be updated based on patient status, additional functional criteria and insurance authorization.    Follow Up Recommendations  Skilled nursing-short term rehab (<3 hours/day)    Assistance Recommended at Discharge Frequent or constant Supervision/Assistance  Patient can return home with the following  A lot of help with walking and/or transfers;A lot of help with  bathing/dressing/bathroom   Equipment Recommendations  Other (comment) (per next venue of care)    Recommendations for Other Services      Precautions / Restrictions Precautions Precautions: Fall Required Braces or Orthoses: Knee Immobilizer - Left Knee Immobilizer - Left: On at all times Restrictions Weight Bearing Restrictions: Yes LLE Weight Bearing: Partial weight bearing Other Position/Activity Restrictions: "Ice and gentle passive range of motion as tolerated." Per Dr. Sabra Heck       Mobility Bed Mobility Overal bed mobility: Needs Assistance Bed Mobility: Supine to Sit, Sit to Supine     Supine to sit: Max assist, +2 for safety/equipment Sit to supine: Max assist, +2 for physical assistance   General bed mobility comments: frequent vcs for technique    Transfers Overall transfer level: Needs assistance Equipment used: Rolling walker (2 wheels) Transfers: Sit to/from Stand Sit to Stand: Mod assist, Max assist, From elevated surface, +2 safety/equipment           General transfer comment: frequent verbal and tactile cues for technique     Balance Overall balance assessment: Needs assistance Sitting-balance support: Feet supported Sitting balance-Leahy Scale: Fair   Postural control: Posterior lean Standing balance support: Reliant on assistive device for balance Standing balance-Leahy Scale: Poor                             ADL either performed or assessed with clinical judgement   ADL Overall ADL's : Needs assistance/impaired     Grooming: Wash/dry face;Sitting;Minimal assistance;Cueing for sequencing               Lower Body Dressing: Maximal assistance;Sit to/from stand Lower Body Dressing Details (indicate cue type and reason): underwear in STS, step by step vcs for technique  Extremity/Trunk Assessment              Vision       Perception     Praxis      Cognition Arousal/Alertness:  Awake/alert Behavior During Therapy: WFL for tasks assessed/performed Overall Cognitive Status: No family/caregiver present to determine baseline cognitive functioning Area of Impairment: Attention, Following commands, Safety/judgement, Awareness, Problem solving, Orientation, Memory                 Orientation Level: Time   Memory: Decreased short-term memory Following Commands: Follows one step commands with increased time Safety/Judgement: Decreased awareness of deficits, Decreased awareness of safety Awareness: Emergent Problem Solving: Slow processing, Decreased initiation, Difficulty sequencing, Requires verbal cues, Requires tactile cues          Exercises      Shoulder Instructions       General Comments      Pertinent Vitals/ Pain       Pain Assessment Pain Assessment: Faces Faces Pain Scale: Hurts a little bit Pain Location: LLE with movement Pain Descriptors / Indicators: Aching, Grimacing, Discomfort Pain Intervention(s): Monitored during session, Limited activity within patient's tolerance, Repositioned  Home Living                                          Prior Functioning/Environment              Frequency  Min 2X/week        Progress Toward Goals  OT Goals(current goals can now be found in the care plan section)  Progress towards OT goals: Progressing toward goals     Plan Discharge plan remains appropriate;Frequency remains appropriate    Co-evaluation    PT/OT/SLP Co-Evaluation/Treatment: Yes Reason for Co-Treatment: Complexity of the patient's impairments (multi-system involvement);Necessary to address cognition/behavior during functional activity PT goals addressed during session: Mobility/safety with mobility;Balance;Proper use of DME OT goals addressed during session: ADL's and self-care      AM-PAC OT "6 Clicks" Daily Activity     Outcome Measure   Help from another person eating meals?: A  Little Help from another person taking care of personal grooming?: A Little Help from another person toileting, which includes using toliet, bedpan, or urinal?: A Lot Help from another person bathing (including washing, rinsing, drying)?: A Little Help from another person to put on and taking off regular upper body clothing?: A Little Help from another person to put on and taking off regular lower body clothing?: A Lot 6 Click Score: 16    End of Session Equipment Utilized During Treatment: Rolling walker (2 wheels);Other (comment) (knee immobilizer)  OT Visit Diagnosis: Unsteadiness on feet (R26.81);Other abnormalities of gait and mobility (R26.89);Muscle weakness (generalized) (M62.81)   Activity Tolerance Patient tolerated treatment well   Patient Left in bed;with call bell/phone within reach;with bed alarm set;with family/visitor present   Nurse Communication Mobility status        Time: 5784-6962 OT Time Calculation (min): 17 min  Charges: OT General Charges $OT Visit: 1 Visit OT Treatments $Self Care/Home Management : 8-22 mins  Shanon Payor, OTD OTR/L  08/20/22, 4:11 PM

## 2022-08-20 NOTE — Consult Note (Signed)
ORTHOPAEDIC CONSULTATION  REQUESTING PHYSICIAN: Damita Lack, MD  Chief Complaint: Left knee pain  HPI: Cindy Huerta is a 75 y.o. female who complains of left knee pain after a fall 2 days ago.  The patient had vertigo and fell at home.  She was seen in the emergency room.  Exam and x-rays were unremarkable at that time.  Continued pain and swelling in the left knee led to an MRI yesterday which shows a minimal impaction fracture of the proximal tibia posteriorly.  Orthopedic consult was requested.  Patient ambulates independently at home otherwise.  Past Medical History:  Diagnosis Date   Arthritis    Breast cancer ([place])    Depression    GERD (gastroesophageal reflux disease)    HLD (hyperlipidemia)    Hypertension    Hypothyroidism    Past Surgical History:  Procedure Laterality Date   ABDOMINAL HYSTERECTOMY  1982   AXILLARY SENTINEL NODE BIOPSY Left 11/20/2021   Procedure: AXILLARY SENTINEL NODE BIOPSY;  Surgeon: Benjamine Sprague, DO;  Location: [facility] ORS;  Service: General;  Laterality: Left;   BREAST BIOPSY Left 09/23/2021   3:00 13cmfn venus marker, path pending   BREAST BIOPSY Left 09/23/2021   3:00 14 cmfn heart marker, path pending   CHOLECYSTECTOMY     ESOPHAGOGASTRODUODENOSCOPY (EGD) WITH PROPOFOL N/A 10/08/2021   Procedure: ESOPHAGOGASTRODUODENOSCOPY (EGD) WITH PROPOFOL;  Surgeon: Jonathon Bellows, MD;  Location: [facility] ENDOSCOPY;  Service: Gastroenterology;  Laterality: N/A;   HEMATOMA EVACUATION Left 11/21/2021   Procedure: EVACUATION HEMATOMA;  Surgeon: Benjamine Sprague, DO;  Location: [facility] ORS;  Service: General;  Laterality: Left;   IR FLUORO GUIDE CV LINE LEFT  08/17/2022   IR US GUIDE VASC ACCESS LEFT  08/17/2022   IR US GUIDE VASC ACCESS RIGHT  08/17/2022   left lumpectomy  2019   TOTAL MASTECTOMY Left 11/20/2021   Procedure: TOTAL MASTECTOMY;  Surgeon: Benjamine Sprague, DO;  Location: [facility] ORS;  Service: General;  Laterality: Left;   Social History   Socioeconomic  History   Marital status: Widowed    Spouse name: Not on file   Number of children: Not on file   Years of education: Not on file   Highest education level: Not on file  Occupational History   Not on file  Tobacco Use   Smoking status: Never   Smokeless tobacco: Never  Vaping Use   Vaping Use: Never used  Substance and Sexual Activity   Alcohol use: Never   Drug use: Never   Sexual activity: Not on file  Other Topics Concern   Not on file  Social History Narrative   Currently lives in with [place] in [place]; out side uses cane/ to keep steady [hx of ministrokes-Jan 2022]; never smoked; no alcohol. Tailoring/sewing- used own buisness    Social Determinants of Health   Financial Resource Strain: Not on file  Food Insecurity: No Food Insecurity (08/17/2022)   Hunger Vital Sign    Worried About Running Out of Food in the Last Year: Never true    Ran Out of Food in the Last Year: Never true  Transportation Needs: No Transportation Needs (08/17/2022)   PRAPARE - Hydrologist (Medical): No    Lack of Transportation (Non-Medical): No  Physical Activity: Not on file  Stress: Not on file  Social Connections: Not on file   Family History  Problem Relation Age of Onset   Heart disease Mother    Cancer Paternal Grandmother  Allergies  Allergen Reactions   Latex Rash   Novocain [Procaine] Other (See Comments)    Unsure - told by DDS not to let anyone give it to her  Confusion    Zestril [Lisinopril] Cough   Benadryl [Diphenhydramine] Other (See Comments)    Jitteriness Agitation   Biaxin [Clarithromycin] Other (See Comments)    Confusion     Roxicodone [Oxycodone] Nausea And Vomiting and Anxiety   Prior to Admission medications   Medication Sig Start Date End Date Taking? Authorizing Provider  amLODipine (NORVASC) 5 MG tablet Take 1 tablet (5 mg total) by mouth daily. 07/30/22  Yes Lavina Hamman, MD  BIOFREEZE 4 % GEL Apply 1 application   topically 2 (two) times daily. 07/15/22  Yes [provider]  bisacodyl (DULCOLAX) 10 MG suppository Place 1 suppository (10 mg total) rectally daily at 12 noon. 07/30/22  Yes Lavina Hamman, MD  Cholecalciferol (VITAMIN D-3 PO) Take 1 capsule by mouth daily.   Yes [provider]  cloNIDine (CATAPRES) 0.1 MG tablet Take 0.1 mg by mouth 2 (two) times daily. 05/16/22  Yes [provider]  docusate sodium (COLACE) 100 MG capsule Take 1 capsule (100 mg total) by mouth 2 (two) times daily. 07/01/22  Yes Danford, Suann Larry, MD  Ferrous Sulfate (IRON PO) Take 1 tablet by mouth daily.   Yes [provider]  FLUoxetine (PROZAC) 20 MG capsule Take 20 mg by mouth daily.   Yes [provider]  levothyroxine (SYNTHROID) 75 MCG tablet Take 75 mcg by mouth daily before breakfast. 02/10/21  Yes [provider]  LORazepam (ATIVAN) 0.5 MG tablet Take 1 tablet (0.5 mg total) by mouth daily. 07/01/22  Yes Danford, Suann Larry, MD  magnesium oxide (MAG-OX) 400 MG tablet Take 1 tablet (400 mg total) by mouth daily. 07/30/22  Yes Lavina Hamman, MD  metoprolol succinate (TOPROL-XL) 50 MG 24 hr tablet Take 50 mg by mouth 2 (two) times daily. 03/18/21  Yes [provider]  Multiple Vitamins-Minerals (THERA-M) TABS Take 1 tablet by mouth daily.   Yes [provider]  pantoprazole (PROTONIX) 40 MG tablet Take 1 tablet (40 mg total) by mouth daily. 10/10/21 09/11/22 Yes Sreenath, Sudheer B, MD  polyethylene glycol powder (GLYCOLAX/MIRALAX) 17 GM/SCOOP powder Take 1 capful (17 g) with water by mouth daily. 07/30/22  Yes Lavina Hamman, MD  potassium chloride SA (KLOR-CON) 20 MEQ tablet Take 20 mEq by mouth daily.   Yes [provider]  telmisartan (MICARDIS) 80 MG tablet Take 1 tablet (80 mg total) by mouth daily. 07/30/22  Yes Lavina Hamman, MD  acetaminophen (TYLENOL) 500 MG tablet Take 500-1,000 mg by mouth every 6 (six) hours as needed for  moderate pain.    [provider]  alum & mag hydroxide-simeth (MAALOX/MYLANTA) 200-200-20 MG/5ML suspension Take 30 mLs by mouth every 6 (six) hours as needed for indigestion or heartburn.    [provider]  ARTIFICIAL TEARS 0.1-0.3 % SOLN Place 1 drop into both eyes daily as needed for dry eyes. 07/15/22   [provider]  aspirin 81 MG EC tablet Take 1 tablet (81 mg total) by mouth daily. RESTART 48HRS AFTER DISCHARGE Patient not taking: Reported on 07/27/2022 11/25/21   Benjamine Sprague, DO  atorvastatin (LIPITOR) 80 MG tablet Take 80 mg by mouth daily. Patient not taking: Reported on 07/27/2022 09/21/21   [provider]  fluticasone (FLONASE) 50 MCG/ACT nasal spray Place 1 spray into both nostrils  daily as needed for allergies or rhinitis.    [provider]  meclizine (ANTIVERT) 12.5 MG tablet Take 12.5 mg by mouth 3 (three) times daily as needed for dizziness.    [provider]  Nutritional Supplements (ENSURE NUTRITION SHAKE) LIQD Take 1 Bottle by mouth 2 (two) times daily as needed (loss of appetite).    [provider]  ondansetron (ZOFRAN-ODT) 4 MG disintegrating tablet Take 1 tablet (4 mg total) by mouth every 8 (eight) hours as needed for nausea or vomiting. 07/30/22   Lavina Hamman, MD  traMADol (ULTRAM) 50 MG tablet Take 1 tablet (50 mg total) by mouth every 6 (six) hours as needed for up to 10 doses for moderate pain. 07/01/22   Danford, Suann Larry, MD  trolamine salicylate (ASPERCREME) 10 % cream Apply 1 application topically as needed for muscle pain.    [provider]  vitamin B-12 (CYANOCOBALAMIN) 100 MCG tablet Take 100 mg by mouth 2 (two) times daily. Patient not taking: Reported on 07/27/2022    [provider]   MR KNEE LEFT WO CONTRAST  Result Date: 08/20/2022 CLINICAL DATA:  Knee pain, chronic, positive xray (Age >= 5y) EXAM: MRI OF THE LEFT KNEE WITHOUT CONTRAST TECHNIQUE: Multiplanar,  multisequence MR imaging of the knee was performed. No intravenous contrast was administered. COMPARISON:  None Available. FINDINGS: MENISCI Medial: Intrasubstance degeneration with horizontal signal likely extending to the undersurface at the medial meniscal body. Lateral: No evidence of lateral meniscus tear. LIGAMENTS Cruciates: ACL and PCL are intact. Collaterals: Medial collateral ligament is intact. Lateral collateral ligament complex is intact. CARTILAGE Patellofemoral: Full-thickness cartilage loss with articular surface irregularity, most prominent along the lateral patellar facet and lateral trochlea. Mild underlying subchondral marrow edema. Medial: There is surface fibrillation with moderate partial-thickness cartilage loss. Lateral:  Mild chondrosis.  No focal chondral defect. JOINT: Large joint effusion with synovitis and fluid-fluid level noted along the lateral recess. POPLITEAL FOSSA: Moderate-sized Baker's cyst with fluid-fluid level. EXTENSOR MECHANISM: There is tendinosis of the distal patellar tendon. BONES: There is a nondisplaced subchondral fracture of the posterolateral tibial plateau with mild associated marrow edema (coronal T1 image 12). No articular surface depression. No aggressive osseous lesion. Tricompartment osteophyte formation. Other: There is soft tissue swelling along the knee. There is no focal fluid collection. IMPRESSION: Nondisplaced subchondral fracture of the posterior aspect of the lateral tibial plateau, with mild associated marrow edema. Large joint effusion and moderate-sized Baker's cyst, with synovitis and a fluid-fluid level suggesting a component of hemarthrosis. 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 Intrasubstance degeneration of the medial meniscus with probable nondisplaced degenerative tear of the meniscal body. Intact cruciate and collateral ligaments. Electronically Signed    By: Maurine Simmering M.D.   On: 08/20/2022 08:17    Positive ROS: All other systems have been reviewed and were otherwise negative with the exception of those mentioned in the HPI and as above.  Physical Exam: General: Alert, no acute distress Cardiovascular: No pedal edema Respiratory: No cyanosis, no use of accessory musculature GI: No organomegaly, abdomen is soft and non-tender Skin: No lesions in the area of chief complaint Neurologic: Sensation intact distally Psychiatric: Patient is competent for consent with normal mood and affect Lymphatic: No axillary or cervical lymphadenopathy  MUSCULOSKELETAL: Left knee has moderate effusion.  There is not a great deal of free fluid.  She has some pain with motion.  Alignment is good.  Skin is intact.  There  is pain with range of motion.  Assessment: Mild impaction fracture left proximal tibia posteriorly  Plan: Ace bandage and knee immobilizer and benefits applied. I do not believe there is enough free fluid to warrant aspiration today. Apply ice needed.   She may be partial weightbearing on the left leg with a walker. Follow-up in my clinic in 2 weeks for reevaluation. 81 mg aspirin twice daily for DVT prophylaxis unless contraindicated    Park Breed, MD [phone]   08/20/2022 1:58 PM

## 2022-08-20 NOTE — Progress Notes (Signed)
PT Cancellation Note  Patient Details Name: Cindy Huerta MRN: [id number] DOB: 12/30/1945   Cancelled Treatment:    Reason Eval/Treat Not Completed: Other (comment). MRI shows Nondisplaced subchondral fracture of the posterior aspect of the lateral tibial plateau, Intrasubstance degeneration of the medial meniscus with probable nondisplaced degenerative tear of the meniscal body. PT will hold for now per RN request, will reattempt as able and medically appropriate.   Lieutenant Diego PT, DPT 8:34 AM,08/20/22

## 2022-08-20 NOTE — Care Management Important Message (Signed)
Important Message  Patient Details  Name: Cindy Huerta MRN: [id number] Date of Birth: [birth year]   Medicare Important Message Given:  Yes     Loann Quill 08/20/2022, 1:21 PM

## 2022-08-20 NOTE — Plan of Care (Signed)
  Problem: Coping: Goal: Level of anxiety will decrease Outcome: Progressing   Problem: Pain Managment: Goal: General experience of comfort will improve Outcome: Progressing   Problem: Safety: Goal: Ability to remain free from injury will improve Outcome: Progressing   

## 2022-08-21 DIAGNOSIS — E871 Hypo-osmolality and hyponatremia: Secondary | ICD-10-CM | POA: Diagnosis not present

## 2022-08-21 LAB — CBC
HCT: 26.2 % — ABNORMAL LOW (ref 36.0–46.0)
Hemoglobin: 8.7 g/dL — ABNORMAL LOW (ref 12.0–15.0)
MCH: 30.3 pg (ref 26.0–34.0)
MCHC: 33.2 g/dL (ref 30.0–36.0)
MCV: 91.3 fL (ref 80.0–100.0)
Platelets: 221 10*3/uL (ref 150–400)
RBC: 2.87 MIL/uL — ABNORMAL LOW (ref 3.87–5.11)
RDW: 13.4 % (ref 11.5–15.5)
WBC: 7.8 10*3/uL (ref 4.0–10.5)
nRBC: 0 % (ref 0.0–0.2)

## 2022-08-21 LAB — MAGNESIUM: Magnesium: 1.5 mg/dL — ABNORMAL LOW (ref 1.7–2.4)

## 2022-08-21 MED ORDER — SODIUM CHLORIDE 0.9 % IV SOLN: INTRAVENOUS | Status: AC

## 2022-08-21 MED ORDER — ASPIRIN 81 MG PO TBEC
81.0000 mg | DELAYED_RELEASE_TABLET | Freq: Two times a day (BID) | ORAL | Status: DC
  Administered 2022-08-21 – 2022-08-24 (×7): 81 mg via ORAL
  Filled 2022-08-21 (×7): qty 1

## 2022-08-21 NOTE — TOC Progression Note (Signed)
Transition of Care [facility]) - Progression Note    Patient Details  Name: Cindy Huerta MRN: [id number] Date of Birth: Jul 03, 1946  Transition of Care [facility]) CM/SW Contact  Zigmund Daniel Dorian Pod, RN Phone Number: [phone] 08/21/2022, 11:10 AM  Clinical Narrative:    Spoke with son [name] concerning bed offers along with addresses and contact numbers to all facilities as requested. Son will discuss with family and [facility] RN will follow up Sun on possible choice.   TOC remains available for discharged needs.      Expected Discharge Plan and Services                                                 Social Determinants of Health (SDOH) Interventions    Readmission Risk Interventions    06/20/2022    1:01 PM 11/25/2021    1:15 PM 10/10/2021    1:24 PM  Readmission Risk Prevention Plan  Transportation Screening  Complete Complete  PCP or Specialist Appt within 5-7 Days   Complete  PCP or Specialist Appt within 3-5 Days Complete Complete   Home Care Screening   Complete  Medication Review (RN CM)   Complete  HRI or Home Care Consult  Complete   Social Work Consult for [place] Planning/Counseling Complete Complete   Palliative Care Screening Not Applicable Not Applicable   Medication Review [employer] photographer)  Complete

## 2022-08-21 NOTE — Progress Notes (Signed)
Physical Therapy Treatment Patient Details Name: Cindy Huerta MRN: [id number] DOB: Mar 26, 1946 Today's Date: 08/21/2022   History of Present Illness Pt is a 76 year old female presenting following fall and dysruia. PMH significant for HTN, hyperlipidemia, TIA, GERD, hypertension, depression with anxiety, CKD-3b, GI bleeding, dCHF, hyponatremia. Further workup showed "nondisplaced subchondral fracture of the  posterolateral tibial plateau, Intrasubstance degeneration of the medial meniscus with probable  nondisplaced degenerative tear of the meniscal body."    PT Comments    Pt was [place] sitting in bed with LLE knee immobilizer donned. She is alert and O x 4 but does have slow processing and requires increased time to respond to questions. She does state," I need to get better so I can [facility](Chief Strategy Officer assuming [employer]) She was able to actively move BLEs but needs Max assist to progress and achieve EOB short sit. Pt was sitting EOB for ~ 1 minutes prior to c/o dizziness with room spinning. She continue to sit EOB perform becoming non responsive with severe nystagmus in both eyes. Author attempted sternal run without pt response. Quickly returned pt to supine. Several minutes later, mental status improves and becomes responsive again. BP was checked ~ 4 minutes after returning to bed 130/64. RN and MD made aware. Pt requested to attempt OOB again however author elected to hold at this time. PT will continue to follow and progress as able per current POC.    Recommendations for follow up therapy are one component of a multi-disciplinary discharge planning process, led by the attending physician.  Recommendations may be updated based on patient status, additional functional criteria and insurance authorization.  Follow Up Recommendations  Skilled nursing-short term rehab (<3 hours/day)     Assistance Recommended at Discharge Frequent or constant Supervision/Assistance   Patient can return home with the following Two people to help with walking and/or transfers;Two people to help with bathing/dressing/bathroom;Direct supervision/assist for medications management;Help with stairs or ramp for entrance;Assist for transportation;Assistance with cooking/housework;Direct supervision/assist for financial management   Equipment Recommendations  Other (comment) (defer to next level of care)       Precautions / Restrictions Precautions Precautions: Fall Required Braces or Orthoses: Knee Immobilizer - Left Knee Immobilizer - Left: Other (comment) (On in wt bearing, ok to removed for gentle AROM/PROM to L knee) Restrictions Weight Bearing Restrictions: Yes LLE Weight Bearing: Weight bearing as tolerated (in knee immobilizer)     Mobility  Bed Mobility Overal bed mobility: Needs Assistance Bed Mobility: Supine to Sit     Supine to sit: Max assist, HOB elevated Sit to supine: Total assist   General bed mobility comments: Pt was able to progress to EOB short sit with increased time + cueing. max assist required to fully achieve EOB short sit. Pt sat EOB x ~ 1 minute prior to complaining on " I'm dizzy wand feel room spinnning." Quickly has change in mental status with nystagmus in both eyes observed. Unresponsive to sternal rub. Total assisted to return to supine. ~ 3-4 minutes to clear and mental status to return to prior to sitting up. BP checked once in supine 130/64. Pt did not remember episode that just occured and wanted to attempt OOB again. Author encouraged pt to relax for now and will return at later time. MD notified/ RN made aware.      Balance Overall balance assessment: Needs assistance Sitting-balance support: Feet supported Sitting balance-Leahy Scale: Fair Sitting balance - Comments: prior to having change  in mental status, pt was sitting EOB with supervision only       Cognition Arousal/Alertness: Awake/alert Behavior During Therapy: WFL for  tasks assessed/performed Overall Cognitive Status: Within Functional Limits for tasks assessed      General Comments: Pt was A and O x 4 but does have slow processing with increased time to respond. Pt is very motivated.               Pertinent Vitals/Pain Pain Assessment Pain Assessment: 0-10 Pain Score: 2  Pain Location: LLE with movement Pain Descriptors / Indicators: Grimacing Pain Intervention(s): Limited activity within patient's tolerance, Monitored during session, Premedicated before session, Repositioned     PT Goals (current goals can now be found in the care plan section) Acute Rehab PT Goals Patient Stated Goal: get better Progress towards PT goals: Progressing toward goals    Frequency    Min 2X/week      PT Plan Current plan remains appropriate    Co-evaluation     PT goals addressed during session: Mobility/safety with mobility;Balance;Proper use of DME;Strengthening/ROM        AM-PAC PT "6 Clicks" Mobility   Outcome Measure  Help needed turning from your back to your side while in a flat bed without using bedrails?: A Lot Help needed moving from lying on your back to sitting on the side of a flat bed without using bedrails?: A Lot Help needed moving to and from a bed to a chair (including a wheelchair)?: A Lot Help needed standing up from a chair using your arms (e.g., wheelchair or bedside chair)?: A Lot Help needed to walk in hospital room?: Total Help needed climbing 3-5 steps with a railing? : Total 6 Click Score: 10    End of Session   Activity Tolerance: Other (comment) (Session limited due to pt having altered mental status upon sitting up EOB. Syncopal episode? Nystamus was observed in both eyes.) Patient left: in bed;with call bell/phone within reach;Other (comment) Nurse Communication: Mobility status PT Visit Diagnosis: Unsteadiness on feet (R26.81);Difficulty in walking, not elsewhere classified (R26.2)     Time: 0086-7619 PT  Time Calculation (min) (ACUTE ONLY): 25 min  Charges:  $Therapeutic Activity: 23-37 mins                     Julaine Fusi PTA 08/21/22, 10:15 AM

## 2022-08-21 NOTE — Progress Notes (Addendum)
PROGRESS NOTE    Cindy Huerta  MRN:[id number] DOB: 01/01/1946 DOA: 08/17/2022 PCP: Bronstein, David, MD   Brief Narrative:   76 y.o. female with medical history significant of HTN, hyperlipidemia, TIA, GERD, hypertension, depression with anxiety, CKD-3b, GI bleeding, dCHF, hyponatremia, who presents with fall and dysuria.  In the ER patient was noted to be clinically dehydrated with hyponatremia.  CT head and cervical spine was negative.  X-ray of the left knee was also unremarkable.  Hyponatremia was initially suspected due to dehydration requiring IV fluids thereafter there was some component of SIADH therefore placed on fluid restriction She is also reporting of significant left knee pain, x-ray negative.  Due to swelling, MRI of the knee performed showed nondisplaced subchondral fracture, large joint effusion, severe arthritis.  Orthopedic team was consulted for their input.  Orthopedic recommended Ace bandage, knee immobilizer and apply ice.  Partial weightbearing as tolerated on the left leg with a walker and follow-up in 2 weeks.  Aspirin 81 mg twice daily for DVT prophylaxis.    Assessment & Plan:  Principal Problem:   Hyponatremia Active Problems:   Acute renal failure superimposed on stage 3b chronic kidney disease (HCC)   UTI (urinary tract infection)   Chronic diastolic CHF (congestive heart failure) (HCC)   Elevated lactic acid level   HTN (hypertension)   Hypothyroidism   Iron deficiency anemia   Fall at home, initial encounter   Abnormal LFTs   HLD (hyperlipidemia)   History of TIA (transient ischemic attack)   Depression with anxiety     Assessment and Plan: * Hyponatremia; concerns for SIADH Admission sodium 125, this morning it is 131 continue hydration, salt tabs - increased to TID.  Urine sodium 67, urinalysis 471, serum osm 266.   Acute renal failure superimposed on stage 3b chronic kidney disease (HCC) Recent baseline creatinine 0.98 on 07/30/1972.  Her  creatinine is 1.65, improving. Cr now 1.1 -Hold Micardis  Hypokalemia - Repletion as needed  UTI (urinary tract infection) Cultures are now negative, patient asymptomatic.  Completed 3-day course of cefepime/Rocephin.  Dizziness/Syncope When working with PT, Will dc her fluids restriction and order IVF  Left knee pain and swelling -XR is negative.  Avoid narcotics per son.  Voltaren gel ordered -MRI left knee done-MRI of the knee performed showed nondisplaced subchondral fracture, large joint effusion, severe arthritis.  Seen by orthopedic team recommended knee immobilizer, Ace wrap, apply ice.  Aspirin 81 mg twice daily for DVT prophylaxis and follow-up in 2 weeks. Thereafter patient can go back to regular aspirin 81 mg daily.  Chronic diastolic CHF (congestive heart failure) (HCC) 2D echo 07/29/2021 showed EF of 55-60% with grade 2 diastolic dysfunction.  Patient does not have leg edema or JVD.  CHF is compensated.  No pulm edema chest x-ray.  Elevated lactic acid level Resolved.   HTN (hypertension) IV hydralazine as needed -Hold Micardis-initially due to worsening renal function -Continue home amlodipine, clonidine, metoprolol  Hypothyroidism - Synthroid  Iron deficiency anemia Today 8.7, no obvious signs of blood loss.  This is probably close to her baseline -Continue ferrous sulfate  Fall at home, initial encounter Fall and questionable syncope: CT head negative.  CT of C-spine negative.  No focal neurodeficit on physical examination. PT/OT = SNF  Abnormal LFTs Resolved  HLD (hyperlipidemia) - Lipitor  History of TIA (transient ischemic attack) - Aspirin, Lipitor  Depression with anxiety - Continue home medications  Due to very difficult IV access, patient has left IJ   central line in place.  Placed 9/12 which will be removed prior to discharge.  PT/OT-SNF  DVT prophylaxis: Lovenox Code Status: DNR Family Communication: Met with both sons at  bedside  Status is: Inpatient TOC team working with family for placement.  Medically stable now.  Subjective: Pain in the left knee better.  Tells me she is able to move her toes without much pain.   Examination: Constitutional: Not in acute distress Respiratory: Clear to auscultation bilaterally Cardiovascular: Normal sinus rhythm, no rubs Abdomen: Nontender nondistended good bowel sounds Musculoskeletal: Left knee immobilizer in place.  Skin: No rashes seen Neurologic: CN 2-12 grossly intact.  And nonfocal Psychiatric: Normal judgment and insight. Alert and oriented x 3. Normal mood.   Left neck central line noted  Vitals:   08/20/22 1152 08/20/22 1617 08/20/22 2139 08/21/22 0636  BP: (!) 111/54 117/89 (!) 135/52 132/60  Pulse: (!) 59 74 69 60  Resp: 17 17 16 16  Temp: 98 F (36.7 C) 98.4 F (36.9 C) 98.4 F (36.9 C)   TempSrc:   Oral   SpO2: 100% 100% 99% 100%  Weight:      Height:        Intake/Output Summary (Last 24 hours) at 08/21/2022 0732 Last data filed at 08/20/2022 1900 Gross per 24 hour  Intake 565 ml  Output 500 ml  Net 65 ml   Filed Weights   08/17/22 0800 08/20/22 0500  Weight: 71.8 kg 73.3 kg     Data Reviewed:   CBC: Recent Labs  Lab 08/17/22 0403 08/18/22 0530 08/19/22 0030 08/20/22 0530  WBC 16.8* 12.7* 11.8* 10.5  NEUTROABS 13.7*  --   --   --   HGB 12.9 10.2* 8.8* 8.7*  HCT 37.0 29.9* 26.2* 26.3*  MCV 85.1 88.5 89.7 90.7  PLT 322 239 200 214   Basic Metabolic Panel: Recent Labs  Lab 08/17/22 0613 08/17/22 1607 08/18/22 0530 08/18/22 1544 08/19/22 0021 08/19/22 0030 08/19/22 0847 08/19/22 1621 08/20/22 0021 08/20/22 0530 08/20/22 0821  NA  --    < > 129*   < > 127*  --  129* 132* 129*  --  131*  K  --    < > 3.2*   < > 4.0  --  4.0 3.9 3.7  --  4.2  CL  --    < > 98   < > 98  --  100 101 100  --  100  CO2  --    < > 23   < > 23  --  24 25 22  --  27  GLUCOSE  --    < > 121*   < > 113*  --  98 99 109*  --  96  BUN   --    < > 25*   < > 18  --  16 15 13  --  16  CREATININE  --    < > 1.29*   < > 1.11*  --  1.01* 0.99 1.02*  --  1.11*  CALCIUM  --    < > 8.4*   < > 8.4*  --  8.6* 8.7* 8.5*  --  8.7*  MG 1.8  --   --   --   --  1.8  --   --   --  1.7  --   PHOS  --   --  3.3  --   --   --   --   --   --   --   --    < > =   values in this interval not displayed.   GFR: Estimated Creatinine Clearance: 42.8 mL/min (A) (by C-G formula based on SCr of 1.11 mg/dL (H)). Liver Function Tests: Recent Labs  Lab 08/17/22 0321 08/19/22 0030  AST 51* 17  ALT 22 12  ALKPHOS 57 41  BILITOT 1.1 0.7  PROT 9.2* 6.2*  ALBUMIN 4.6 3.0*   No results for input(s): "LIPASE", "AMYLASE" in the last 168 hours. No results for input(s): "AMMONIA" in the last 168 hours. Coagulation Profile: No results for input(s): "INR", "PROTIME" in the last 168 hours. Cardiac Enzymes: No results for input(s): "CKTOTAL", "CKMB", "CKMBINDEX", "TROPONINI" in the last 168 hours. BNP (last 3 results) No results for input(s): "PROBNP" in the last 8760 hours. HbA1C: No results for input(s): "HGBA1C" in the last 72 hours. CBG: No results for input(s): "GLUCAP" in the last 168 hours. Lipid Profile: No results for input(s): "CHOL", "HDL", "LDLCALC", "TRIG", "CHOLHDL", "LDLDIRECT" in the last 72 hours. Thyroid Function Tests: No results for input(s): "TSH", "T4TOTAL", "FREET4", "T3FREE", "THYROIDAB" in the last 72 hours. Anemia Panel: No results for input(s): "VITAMINB12", "FOLATE", "FERRITIN", "TIBC", "IRON", "RETICCTPCT" in the last 72 hours. Sepsis Labs: Recent Labs  Lab 08/17/22 0321 08/17/22 0613 08/17/22 2235  PROCALCITON 0.34  --   --   LATICACIDVEN  --  3.0* 0.9    Recent Results (from the past 240 hour(s))  Urine Culture     Status: Abnormal   Collection Time: 08/17/22  2:46 AM   Specimen: Urine, Random  Result Value Ref Range Status   Specimen Description   Final    URINE, RANDOM Performed at [facility], [address] [place], [place] [address]    Special Requests   Final    NONE Performed at [facility], [address] [place], [place] [address]    Culture MULTIPLE SPECIES PRESENT, SUGGEST RECOLLECTION (A)  Final   Report Status 08/18/2022 FINAL  Final  SARS Coronavirus 2 by RT PCR (hospital order, performed in [facility] hospital lab) *cepheid single result test* Anterior Nasal Swab     Status: None   Collection Time: 08/17/22  5:47 AM   Specimen: Anterior Nasal Swab  Result Value Ref Range Status   SARS Coronavirus 2 by RT PCR NEGATIVE NEGATIVE Final    Comment: (NOTE) SARS-CoV-2 target nucleic acids are NOT DETECTED.  The SARS-CoV-2 RNA is generally detectable in upper and lower respiratory specimens during the acute phase of infection. The lowest concentration of SARS-CoV-2 viral copies this assay can detect is 250 copies / mL. A negative result does not preclude SARS-CoV-2 infection and should not be used as the sole basis for treatment or other patient management decisions.  A negative result may occur with improper specimen collection / handling, submission of specimen other than nasopharyngeal swab, presence of viral mutation(s) within the areas targeted by this assay, and inadequate number of viral copies (<250 copies / mL). A negative result must be combined with clinical observations, patient history, and epidemiological information.  Fact Sheet for Patients:   https://www.fda.gov/media/158405/download  Fact Sheet for Healthcare Providers: https://www.fda.gov/media/158404/download  This test is not yet approved or  cleared by the [place] FDA and has been authorized for detection and/or diagnosis of SARS-CoV-2 by FDA under an Emergency Use Authorization (EUA).  This EUA will remain in effect (meaning this test can be used) for the duration of the COVID-19 declaration under Section 564(b)(1) of the Act, 21 U.S.C. section 360bbb-3(b)(1), unless the  authorization is   terminated or revoked sooner.  Performed at [facility], [address] [place], [place] [address]   Blood culture (routine x 2)     Status: None (Preliminary result)   Collection Time: 08/17/22  6:13 AM   Specimen: BLOOD RIGHT HAND  Result Value Ref Range Status   Specimen Description BLOOD RIGHT HAND  Final   Special Requests   Final    BOTTLES DRAWN AEROBIC AND ANAEROBIC Blood Culture adequate volume   Culture   Final    NO GROWTH 4 DAYS Performed at [facility], [address], [place], [place] [address]    Report Status PENDING  Incomplete  Blood culture (routine x 2)     Status: None (Preliminary result)   Collection Time: 08/17/22  6:13 AM   Specimen: BLOOD RIGHT ARM  Result Value Ref Range Status   Specimen Description BLOOD RIGHT ARM  Final   Special Requests   Final    BOTTLES DRAWN AEROBIC AND ANAEROBIC Blood Culture results may not be optimal due to an inadequate volume of blood received in culture bottles   Culture   Final    NO GROWTH 4 DAYS Performed at [facility], [address], [place], [place] [address]    Report Status PENDING  Incomplete         Radiology Studies: MR KNEE LEFT WO CONTRAST  Result Date: 08/20/2022 CLINICAL DATA:  Knee pain, chronic, positive xray (Age >= 5y) EXAM: MRI OF THE LEFT KNEE WITHOUT CONTRAST TECHNIQUE: Multiplanar, multisequence MR imaging of the knee was performed. No intravenous contrast was administered. COMPARISON:  None Available. FINDINGS: MENISCI Medial: Intrasubstance degeneration with horizontal signal likely extending to the undersurface at the medial meniscal body. Lateral: No evidence of lateral meniscus tear. LIGAMENTS Cruciates: ACL and PCL are intact. Collaterals: Medial collateral ligament is intact. Lateral collateral ligament complex is intact. CARTILAGE Patellofemoral: Full-thickness cartilage loss with articular surface irregularity, most prominent along the  lateral patellar facet and lateral trochlea. Mild underlying subchondral marrow edema. Medial: There is surface fibrillation with moderate partial-thickness cartilage loss. Lateral:  Mild chondrosis.  No focal chondral defect. JOINT: Large joint effusion with synovitis and fluid-fluid level noted along the lateral recess. POPLITEAL FOSSA: Moderate-sized Baker's cyst with fluid-fluid level. EXTENSOR MECHANISM: There is tendinosis of the distal patellar tendon. BONES: There is a nondisplaced subchondral fracture of the posterolateral tibial plateau with mild associated marrow edema (coronal T1 image 12). No articular surface depression. No aggressive osseous lesion. Tricompartment osteophyte formation. Other: There is soft tissue swelling along the knee. There is no focal fluid collection. IMPRESSION: Nondisplaced subchondral fracture of the posterior aspect of the lateral tibial plateau, with mild associated marrow edema. Large joint effusion and moderate-sized Baker's cyst, with synovitis and a fluid-fluid level suggesting a component of hemarthrosis. 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 Intrasubstance degeneration of the medial meniscus with probable nondisplaced degenerative tear of the meniscal body. Intact cruciate and collateral ligaments. Electronically Signed   By: Maurine Simmering M.D.   On: 08/20/2022 08:17        Scheduled Meds:  amLODipine  5 mg Oral Daily   aspirin EC  81 mg Oral BID   bisacodyl  10 mg Rectal Q1200   Chlorhexidine Gluconate Cloth  6 each Topical Daily   cholecalciferol  5,000 Units Oral Daily   cloNIDine  0.1 mg Oral BID   diclofenac Sodium  4 g Topical QID   docusate sodium  100 mg Oral BID   enoxaparin (LOVENOX) injection  40 mg Subcutaneous Q24H   ferrous sulfate  325 mg Oral Q breakfast   FLUoxetine  20 mg Oral Daily   levothyroxine  75 mcg Oral QAC breakfast   LORazepam  0.5 mg Oral  Daily   magnesium oxide  400 mg Oral Daily   metoprolol succinate  50 mg Oral BID   multivitamin with minerals  1 tablet Oral Daily   Muscle Rub  1 Application Topical BID   pantoprazole  40 mg Oral Daily   sodium chloride flush  10-40 mL Intracatheter Q12H   sodium chloride  1 g Oral TID WC   Continuous Infusions:  promethazine (PHENERGAN) injection (IM or IVPB) 12.5 mg (08/18/22 0012)     LOS: 4 days   Time spent= 35 mins    Caitlan Chauca Arsenio Loader, MD [facility]  If 7PM-7AM, please contact night-coverage  08/21/2022, 7:32 AM

## 2022-08-22 DIAGNOSIS — E871 Hypo-osmolality and hyponatremia: Secondary | ICD-10-CM | POA: Diagnosis not present

## 2022-08-22 LAB — CULTURE, BLOOD (ROUTINE X 2)
Culture: NO GROWTH
Culture: NO GROWTH
Special Requests: ADEQUATE

## 2022-08-22 LAB — CBC
HCT: 25.2 % — ABNORMAL LOW (ref 36.0–46.0)
Hemoglobin: 8.7 g/dL — ABNORMAL LOW (ref 12.0–15.0)
MCH: 31.2 pg (ref 26.0–34.0)
MCHC: 34.5 g/dL (ref 30.0–36.0)
MCV: 90.3 fL (ref 80.0–100.0)
Platelets: 225 10*3/uL (ref 150–400)
RBC: 2.79 MIL/uL — ABNORMAL LOW (ref 3.87–5.11)
RDW: 13.2 % (ref 11.5–15.5)
WBC: 7.8 10*3/uL (ref 4.0–10.5)
nRBC: 0 % (ref 0.0–0.2)

## 2022-08-22 LAB — BASIC METABOLIC PANEL
Anion gap: 5 (ref 5–15)
BUN: 11 mg/dL (ref 8–23)
CO2: 25 mmol/L (ref 22–32)
Calcium: 8.6 mg/dL — ABNORMAL LOW (ref 8.9–10.3)
Chloride: 104 mmol/L (ref 98–111)
Creatinine, Ser: 0.79 mg/dL (ref 0.44–1.00)
GFR, Estimated: >60 mL/min (ref >60)
Glucose, Bld: 94 mg/dL (ref 70–99)
Potassium: 4 mmol/L (ref 3.5–5.1)
Sodium: 134 mmol/L — ABNORMAL LOW (ref 135–145)

## 2022-08-22 LAB — MAGNESIUM: Magnesium: 1.4 mg/dL — ABNORMAL LOW (ref 1.7–2.4)

## 2022-08-22 MED ORDER — MAGNESIUM SULFATE 4 GM/100ML IV SOLN
4.0000 g | Freq: Once | INTRAVENOUS | Status: AC
  Administered 2022-08-22: 4 g via INTRAVENOUS
  Filled 2022-08-22: qty 100

## 2022-08-22 NOTE — TOC Progression Note (Addendum)
Transition of Care [facility]) - Progression Note    Patient Details  Name: Cindy Huerta MRN: [id number] Date of Birth: 1946-10-19  [facility] of [facility] CM/SW Contact  Zigmund Daniel Dorian Pod, RN Phone Number: 08/22/2022, 12:01 PM  Clinical Narrative:    Damaris Schooner with son [name] today who accepted [facility] for the SNF bed offered. RN contacted [employer] and left a HIPAA voice message requesting a call back in order to confirm acceptance of the bed offered. Team updated accordingly.  Will request TOC to contact the [facility] admission office and confirmed the acceptance of the bed offered and [facility] RN will start the auth for the facility placement.  Candace Cruise pending [id number].          Expected Discharge Plan and Services                                                 Social Determinants of Health (SDOH) Interventions    Readmission Risk Interventions    06/20/2022    1:01 PM 11/25/2021    1:15 PM 10/10/2021    1:24 PM  Readmission Risk Prevention Plan  Transportation Screening  Complete Complete  PCP or Specialist Appt within 5-7 Days   Complete  PCP or Specialist Appt within 3-5 Days Complete Complete   Home Care Screening   Complete  Medication Review (RN CM)   Complete  HRI or Home Care Consult  Complete   Social Work Consult for [place] Planning/Counseling Complete Complete   Palliative Care Screening Not Applicable Not Applicable   Medication Review [employer])  Complete

## 2022-08-22 NOTE — Progress Notes (Signed)
PROGRESS NOTE    Cindy Huerta  ZOX:[id number] DOB: [birth year] DOA: 08/17/2022 PCP: Juluis Pitch, MD   Brief Narrative:   76 y.o. female with medical history significant of HTN, hyperlipidemia, TIA, GERD, hypertension, depression with anxiety, CKD-3b, GI bleeding, dCHF, hyponatremia, who presents with fall and dysuria.  In the ER patient was noted to be clinically dehydrated with hyponatremia.  CT head and cervical spine was negative.  X-ray of the left knee was also unremarkable.  Hyponatremia was initially suspected due to dehydration requiring IV fluids thereafter there was some component of SIADH therefore placed on fluid restriction She is also reporting of significant left knee pain, x-ray negative.  Due to swelling, MRI of the knee performed showed nondisplaced subchondral fracture, large joint effusion, severe arthritis.  Orthopedic team was consulted for their input.  Orthopedic recommended Ace bandage, knee immobilizer and apply ice.  Partial weightbearing as tolerated on the left leg with a walker and follow-up in 2 weeks.  Aspirin 81 mg twice daily for DVT prophylaxis.    Assessment & Plan:  Principal Problem:   Hyponatremia Active Problems:   Acute renal failure superimposed on stage 3b chronic kidney disease (HCC)   UTI (urinary tract infection)   Chronic diastolic CHF (congestive heart failure) (HCC)   Elevated lactic acid level   HTN (hypertension)   Hypothyroidism   Iron deficiency anemia   Fall at home, initial encounter   Abnormal LFTs   HLD (hyperlipidemia)   History of TIA (transient ischemic attack)   Depression with anxiety     Assessment and Plan: * Hyponatremia; concerns for SIADH Admission sodium 125, this morning sodium is 134.  Initially treated with fluid restriction.  We will discontinue salt tablets  Acute renal failure superimposed on stage 3b chronic kidney disease ([place]), resolved Recent baseline creatinine 0.98 on 07/30/1972.  Her creatinine  is 1.65, improving.  Creatinine today 0.8 -Hold Micardis  Hypokalemia/hypomagnesemia - Repletion as needed  UTI (urinary tract infection) Cultures are now negative, patient asymptomatic.  Completed 3-day course of cefepime/Rocephin.  Dizziness/Syncope Improved, will let the fluid expire later this morning and continue to monitor her.  Left knee pain and swelling -XR is negative.  Avoid narcotics per son.  Voltaren gel ordered -MRI left knee done-MRI of the knee performed showed nondisplaced subchondral fracture, large joint effusion, severe arthritis.  Seen by orthopedic team recommended knee immobilizer, Ace wrap, apply ice.  Aspirin 81 mg twice daily for DVT prophylaxis and follow-up in 2 weeks. Thereafter patient can go back to regular aspirin 81 mg daily.  Chronic diastolic CHF (congestive heart failure) ([place]) 2D echo 07/29/2021 showed EF of 55-60% with grade 2 diastolic dysfunction.  Patient does not have leg edema or JVD.  CHF is compensated.  No pulm edema chest x-ray.  Elevated lactic acid level Resolved.   HTN (hypertension) IV hydralazine as needed -Hold Micardis-initially due to worsening renal function -Continue home amlodipine, clonidine, metoprolol  Hypothyroidism - Synthroid  Iron deficiency anemia Today 8.7, no obvious signs of blood loss.  This is probably close to her baseline -Continue ferrous sulfate  Fall at home, initial encounter Fall and questionable syncope: CT head negative.  CT of C-spine negative.  No focal neurodeficit on physical examination. PT/OT = SNF  Abnormal LFTs Resolved  HLD (hyperlipidemia) - Lipitor  History of TIA (transient ischemic attack) - Aspirin, Lipitor  Depression with anxiety - Continue home medications  Due to very difficult IV access, patient has left IJ central line  in place.  Placed 9/12 which will be removed prior to discharge.  PT/OT-SNF  DVT prophylaxis: Lovenox Code Status: DNR Family Communication: Met  with both sons at bedside 9/15  Status is: Inpatient [facility] team working with family for placement.  Medically stable now.  Subjective: Has some left knee pain as expected but overall feels well.  Examination: Constitutional: Not in acute distress Respiratory: Clear to auscultation bilaterally Cardiovascular: Normal sinus rhythm, no rubs Abdomen: Nontender nondistended good bowel sounds Musculoskeletal: Left knee immobilizer in place Skin: No rashes seen Neurologic: CN 2-12 grossly intact.  And nonfocal Psychiatric: Normal judgment and insight. Alert and oriented x 3. Normal mood.  Left neck central line noted  Vitals:   08/21/22 0837 08/21/22 2127 08/22/22 0542 08/22/22 0610  BP: (!) 143/120 (!) 164/73 (!) 145/66   Pulse: 65 66 (!) 58   Resp:  18 16   Temp: 98.2 F (36.8 C) 98.4 F (36.9 C) 98.3 F (36.8 C)   TempSrc:  Oral Oral   SpO2: 100% 100% 100%   Weight:    74.2 kg  Height:        Intake/Output Summary (Last 24 hours) at 08/22/2022 0756 Last data filed at 08/22/2022 0653 Gross per 24 hour  Intake 2052.23 ml  Output 1250 ml  Net 802.23 ml   Filed Weights   08/17/22 0800 08/20/22 0500 08/22/22 0610  Weight: 71.8 kg 73.3 kg 74.2 kg     Data Reviewed:   CBC: Recent Labs  Lab 08/17/22 0403 08/18/22 0530 08/19/22 0030 08/20/22 0530 08/21/22 0500 08/22/22 0552  WBC 16.8* 12.7* 11.8* 10.5 7.8 7.8  NEUTROABS 13.7*  --   --   --   --   --   HGB 12.9 10.2* 8.8* 8.7* 8.7* 8.7*  HCT 37.0 29.9* 26.2* 26.3* 26.2* 25.2*  MCV 85.1 88.5 89.7 90.7 91.3 90.3  PLT 322 239 200 214 221 706   Basic Metabolic Panel: Recent Labs  Lab 08/17/22 0613 08/17/22 1607 08/18/22 0530 08/18/22 1544 08/19/22 0030 08/19/22 0847 08/19/22 1621 08/20/22 0021 08/20/22 0530 08/20/22 0821 08/21/22 0500 08/22/22 0552  NA  --    < > 129*   < >  --  129* 132* 129*  --  131*  --  134*  K  --    < > 3.2*   < >  --  4.0 3.9 3.7  --  4.2  --  4.0  CL  --    < > 98   < >  --  100  101 100  --  100  --  104  CO2  --    < > 23   < >  --  $R'24 25 22  'Av$ --  27  --  25  GLUCOSE  --    < > 121*   < >  --  98 99 109*  --  96  --  94  BUN  --    < > 25*   < >  --  $R'16 15 13  'Je$ --  16  --  11  CREATININE  --    < > 1.29*   < >  --  1.01* 0.99 1.02*  --  1.11*  --  0.79  CALCIUM  --    < > 8.4*   < >  --  8.6* 8.7* 8.5*  --  8.7*  --  8.6*  MG 1.8  --   --   --  1.8  --   --   --  1.7  --  1.5* 1.4*  PHOS  --   --  3.3  --   --   --   --   --   --   --   --   --    < > = values in this interval not displayed.   GFR: Estimated Creatinine Clearance: 59.7 mL/min (by C-G formula based on SCr of 0.79 mg/dL). Liver Function Tests: Recent Labs  Lab 08/17/22 0321 08/19/22 0030  AST 51* 17  ALT 22 12  ALKPHOS 57 41  BILITOT 1.1 0.7  PROT 9.2* 6.2*  ALBUMIN 4.6 3.0*   No results for input(s): "LIPASE", "AMYLASE" in the last 168 hours. No results for input(s): "AMMONIA" in the last 168 hours. Coagulation Profile: No results for input(s): "INR", "PROTIME" in the last 168 hours. Cardiac Enzymes: No results for input(s): "CKTOTAL", "CKMB", "CKMBINDEX", "TROPONINI" in the last 168 hours. BNP (last 3 results) No results for input(s): "PROBNP" in the last 8760 hours. HbA1C: No results for input(s): "HGBA1C" in the last 72 hours. CBG: No results for input(s): "GLUCAP" in the last 168 hours. Lipid Profile: No results for input(s): "CHOL", "HDL", "LDLCALC", "TRIG", "CHOLHDL", "LDLDIRECT" in the last 72 hours. Thyroid Function Tests: No results for input(s): "TSH", "T4TOTAL", "FREET4", "T3FREE", "THYROIDAB" in the last 72 hours. Anemia Panel: No results for input(s): "VITAMINB12", "FOLATE", "FERRITIN", "TIBC", "IRON", "RETICCTPCT" in the last 72 hours. Sepsis Labs: Recent Labs  Lab 08/17/22 0321 08/17/22 0613 08/17/22 2235  PROCALCITON 0.34  --   --   LATICACIDVEN  --  3.0* 0.9    Recent Results (from the past 240 hour(s))  Urine Culture     Status: Abnormal   Collection Time:  08/17/22  2:46 AM   Specimen: Urine, Random  Result Value Ref Range Status   Specimen Description   Final    URINE, RANDOM Performed at [facility], [address], [place], [place] [address]    Special Requests   Final    NONE Performed at [facility], [address] [place], [place] [address]    Culture MULTIPLE SPECIES PRESENT, SUGGEST RECOLLECTION (A)  Final   Report Status 08/18/2022 FINAL  Final  SARS Coronavirus 2 by RT PCR (hospital order, performed in [facility] hospital lab) *cepheid single result test* Anterior Nasal Swab     Status: None   Collection Time: 08/17/22  5:47 AM   Specimen: Anterior Nasal Swab  Result Value Ref Range Status   SARS Coronavirus 2 by RT PCR NEGATIVE NEGATIVE Final    Comment: (NOTE) SARS-CoV-2 target nucleic acids are NOT DETECTED.  The SARS-CoV-2 RNA is generally detectable in upper and lower respiratory specimens during the acute phase of infection. The lowest concentration of SARS-CoV-2 viral copies this assay can detect is 250 copies / mL. A negative result does not preclude SARS-CoV-2 infection and should not be used as the sole basis for treatment or other patient management decisions.  A negative result may occur with improper specimen collection / handling, submission of specimen other than nasopharyngeal swab, presence of viral mutation(s) within the areas targeted by this assay, and inadequate number of viral copies (<250 copies / mL). A negative result must be combined with clinical observations, patient history, and epidemiological information.  Fact Sheet for Patients:   https://www.patel.info/  Fact Sheet for Healthcare Providers: https://hall.com/  This test is not yet approved or  cleared by the [place] FDA and has been authorized for detection and/or diagnosis  of SARS-CoV-2 by FDA under an Emergency Use Authorization (EUA).  This EUA will remain in  effect (meaning this test can be used) for the duration of the COVID-19 declaration under Section 564(b)(1) of the Act, 21 U.S.C. section 360bbb-3(b)(1), unless the authorization is terminated or revoked sooner.  Performed at [facility], [address] [place], [place] [address]   Blood culture (routine x 2)     Status: None   Collection Time: 08/17/22  6:13 AM   Specimen: BLOOD RIGHT HAND  Result Value Ref Range Status   Specimen Description BLOOD RIGHT HAND  Final   Special Requests   Final    BOTTLES DRAWN AEROBIC AND ANAEROBIC Blood Culture adequate volume   Culture   Final    NO GROWTH 5 DAYS Performed at [facility], [address], [place], [place] [address]    Report Status 08/22/2022 FINAL  Final  Blood culture (routine x 2)     Status: None   Collection Time: 08/17/22  6:13 AM   Specimen: BLOOD RIGHT ARM  Result Value Ref Range Status   Specimen Description BLOOD RIGHT ARM  Final   Special Requests   Final    BOTTLES DRAWN AEROBIC AND ANAEROBIC Blood Culture results may not be optimal due to an inadequate volume of blood received in culture bottles   Culture   Final    NO GROWTH 5 DAYS Performed at [facility], [address] [place], [place] [address]    Report Status 08/22/2022 FINAL  Final         Radiology Studies: No results found.      Scheduled Meds:  amLODipine  5 mg Oral Daily   aspirin EC  81 mg Oral BID   bisacodyl  10 mg Rectal Q1200   Chlorhexidine Gluconate Cloth  6 each Topical Daily   cholecalciferol  5,000 Units Oral Daily   cloNIDine  0.1 mg Oral BID   diclofenac Sodium  4 g Topical QID   docusate sodium  100 mg Oral BID   enoxaparin (LOVENOX) injection  40 mg Subcutaneous Q24H   ferrous sulfate  325 mg Oral Q breakfast   FLUoxetine  20 mg Oral Daily   levothyroxine  75 mcg Oral QAC breakfast   LORazepam  0.5 mg Oral Daily   magnesium oxide  400 mg Oral Daily   metoprolol succinate  50 mg Oral  BID   multivitamin with minerals  1 tablet Oral Daily   Muscle Rub  1 Application Topical BID   pantoprazole  40 mg Oral Daily   sodium chloride flush  10-40 mL Intracatheter Q12H   sodium chloride  1 g Oral TID WC   Continuous Infusions:  sodium chloride 75 mL/hr at 08/21/22 1846   promethazine (PHENERGAN) injection (IM or IVPB) 200 mL/hr at 08/21/22 1846     LOS: 5 days   Time spent= 35 mins    Kesia Dalto Arsenio Loader, MD [facility]  If 7PM-7AM, please contact night-coverage  08/22/2022, 7:56 AM

## 2022-08-23 DIAGNOSIS — E871 Hypo-osmolality and hyponatremia: Secondary | ICD-10-CM | POA: Diagnosis not present

## 2022-08-23 LAB — BASIC METABOLIC PANEL
Anion gap: 6 (ref 5–15)
BUN: 13 mg/dL (ref 8–23)
CO2: 27 mmol/L (ref 22–32)
Calcium: 8.8 mg/dL — ABNORMAL LOW (ref 8.9–10.3)
Chloride: 101 mmol/L (ref 98–111)
Creatinine, Ser: 0.91 mg/dL (ref 0.44–1.00)
GFR, Estimated: >60 mL/min (ref >60)
Glucose, Bld: 105 mg/dL — ABNORMAL HIGH (ref 70–99)
Potassium: 4.1 mmol/L (ref 3.5–5.1)
Sodium: 134 mmol/L — ABNORMAL LOW (ref 135–145)

## 2022-08-23 LAB — CBC
HCT: 26.9 % — ABNORMAL LOW (ref 36.0–46.0)
Hemoglobin: 9 g/dL — ABNORMAL LOW (ref 12.0–15.0)
MCH: 30.2 pg (ref 26.0–34.0)
MCHC: 33.5 g/dL (ref 30.0–36.0)
MCV: 90.3 fL (ref 80.0–100.0)
Platelets: 241 10*3/uL (ref 150–400)
RBC: 2.98 MIL/uL — ABNORMAL LOW (ref 3.87–5.11)
RDW: 13.2 % (ref 11.5–15.5)
WBC: 8.1 10*3/uL (ref 4.0–10.5)
nRBC: 0 % (ref 0.0–0.2)

## 2022-08-23 LAB — HEPATITIS PANEL, ACUTE: Hep A IgM: NEGATIVE — AB

## 2022-08-23 LAB — MAGNESIUM: Magnesium: 1.7 mg/dL (ref 1.7–2.4)

## 2022-08-23 MED ORDER — TRAMADOL HCL 50 MG PO TABS: 50.0000 mg | ORAL_TABLET | Freq: Four times a day (QID) | ORAL | 0 refills | Status: AC | PRN

## 2022-08-23 MED ORDER — SODIUM CHLORIDE 0.9 % IV BOLUS
500.0000 mL | Freq: Once | INTRAVENOUS | Status: AC
  Administered 2022-08-23: 500 mL via INTRAVENOUS

## 2022-08-23 MED ORDER — SODIUM CHLORIDE 0.9 % IV SOLN: INTRAVENOUS | Status: AC

## 2022-08-23 NOTE — Progress Notes (Signed)
[facility] portal checked for [facility] authorization. Requesting still pending. MD and nurse notified.

## 2022-08-23 NOTE — Progress Notes (Signed)
PROGRESS NOTE    Cindy Huerta  YNW:[id number] DOB: [birth year] DOA: 08/17/2022 PCP: Juluis Pitch, MD   Brief Narrative:   76 y.o. female with medical history significant of HTN, hyperlipidemia, TIA, GERD, hypertension, depression with anxiety, CKD-3b, GI bleeding, dCHF, hyponatremia, who presents with fall and dysuria.  In the ER patient was noted to be clinically dehydrated with hyponatremia.  CT head and cervical spine was negative.  X-ray of the left knee was also unremarkable.  Hyponatremia was initially suspected due to dehydration requiring IV fluids thereafter there was some component of SIADH therefore placed on fluid restriction She is also reporting of significant left knee pain, x-ray negative.  Due to swelling, MRI of the knee performed showed nondisplaced subchondral fracture, large joint effusion, severe arthritis.  Orthopedic team was consulted for their input.  Orthopedic recommended Ace bandage, knee immobilizer and apply ice.  Partial weightbearing as tolerated on the left leg with a walker and follow-up in 2 weeks.  Aspirin 81 mg twice daily for DVT prophylaxis.    Assessment & Plan:  Principal Problem:   Hyponatremia Active Problems:   Acute renal failure superimposed on stage 3b chronic kidney disease (HCC)   UTI (urinary tract infection)   Chronic diastolic CHF (congestive heart failure) (HCC)   Elevated lactic acid level   HTN (hypertension)   Hypothyroidism   Iron deficiency anemia   Fall at home, initial encounter   Abnormal LFTs   HLD (hyperlipidemia)   History of TIA (transient ischemic attack)   Depression with anxiety     Assessment and Plan:   Acute renal failure superimposed on stage 3b chronic kidney disease ([place]), resolved Recent baseline creatinine 0.98 on 07/30/1972.  Her creatinine is 1.65, improving.  Creatinine today 0.9 -Hold Micardis  * Hyponatremia; concerns for SIADH- improved and stable.  Admission sodium 125, this morning sodium  is 134.  Initially treated with fluid restriction.  We will discontinue salt tablets  Hypokalemia/hypomagnesemia - Repletion as needed  UTI (urinary tract infection) Cultures are now negative, patient asymptomatic.  Completed 3-day course of cefepime/Rocephin.  Dizziness/Syncope Off and on having symptoms. Stop Clonidine, small IVF bolus. Uptirate norvasc if needed for BP  Left knee pain and swelling -XR is negative.  Avoid narcotics per son.  Voltaren gel ordered -MRI left knee done-MRI of the knee performed showed nondisplaced subchondral fracture, large joint effusion, severe arthritis.  Seen by orthopedic team recommended knee immobilizer, Ace wrap, apply ice.  Aspirin 81 mg twice daily for DVT prophylaxis and follow-up in 2 weeks. Thereafter patient can go back to regular aspirin 81 mg daily.  Chronic diastolic CHF (congestive heart failure) ([place]) 2D echo 07/29/2021 showed EF of 55-60% with grade 2 diastolic dysfunction.  Patient does not have leg edema or JVD.  CHF is compensated.  No pulm edema chest x-ray.  Elevated lactic acid level Resolved.   HTN (hypertension) IV hydralazine as needed -Hold Micardis-initially due to worsening renal function -Continue home amlodipine, metoprolol. Hold clinidine.   Hypothyroidism - Synthroid  Iron deficiency anemia Hb is stable.  -Continue ferrous sulfate  Fall at home, initial encounter Fall and questionable syncope: CT head negative.  CT of C-spine negative.  No focal neurodeficit on physical examination. PT/OT = SNF  Abnormal LFTs Resolved  HLD (hyperlipidemia) - Lipitor  History of TIA (transient ischemic attack) - Aspirin, Lipitor  Depression with anxiety - Continue home medications  Due to very difficult IV access, patient has left IJ central line in place.  Placed 9/12 which will be removed prior to discharge.  PT/OT-SNF  DVT prophylaxis: Lovenox Code Status: DNR Family Communication: Norm Parcel.   Status  is: [facility] working with family for placement.  Medically stable now.  Subjective: Doing well, no complaints. Had some dizziness in the morning when trying to get up.   Examination: Constitutional: Not in acute distress. Elderly frail Respiratory: Clear to auscultation bilaterally Cardiovascular: Normal sinus rhythm, no rubs Abdomen: Nontender nondistended good bowel sounds Musculoskeletal: No edema noted. Left knee immobilizer in place.  Skin: No rashes seen Neurologic: CN 2-12 grossly intact.  And nonfocal Psychiatric: Normal judgment and insight. Alert and oriented x 3. Normal mood.    Left neck central line noted  Vitals:   08/23/22 0500 08/23/22 0546 08/23/22 0822 08/23/22 0825  BP:  (!) 140/59 (!) 161/58 (!) 161/58  Pulse:  62 65   Resp:  16 18   Temp:  98.3 F (36.8 C) 98.5 F (36.9 C)   TempSrc:      SpO2:  100% 100%   Weight: 74 kg     Height:        Intake/Output Summary (Last 24 hours) at 08/23/2022 1202 Last data filed at 08/23/2022 0830 Gross per 24 hour  Intake 871.07 ml  Output 100 ml  Net 771.07 ml   Filed Weights   08/20/22 0500 08/22/22 0610 08/23/22 0500  Weight: 73.3 kg 74.2 kg 74 kg     Data Reviewed:   CBC: Recent Labs  Lab 08/17/22 0403 08/18/22 0530 08/19/22 0030 08/20/22 0530 08/21/22 0500 08/22/22 0552 08/23/22 0723  WBC 16.8*   < > 11.8* 10.5 7.8 7.8 8.1  NEUTROABS 13.7*  --   --   --   --   --   --   HGB 12.9   < > 8.8* 8.7* 8.7* 8.7* 9.0*  HCT 37.0   < > 26.2* 26.3* 26.2* 25.2* 26.9*  MCV 85.1   < > 89.7 90.7 91.3 90.3 90.3  PLT 322   < > 200 214 221 225 241   < > = values in this interval not displayed.   Basic Metabolic Panel: Recent Labs  Lab 08/18/22 0530 08/18/22 1544 08/19/22 0030 08/19/22 0847 08/19/22 1621 08/20/22 0021 08/20/22 0530 08/20/22 0821 08/21/22 0500 08/22/22 0552 08/23/22 0723  NA 129*   < >  --    < > 132* 129*  --  131*  --  134* 134*  K 3.2*   < >  --    < > 3.9 3.7  --  4.2   --  4.0 4.1  CL 98   < >  --    < > 101 100  --  100  --  104 101  CO2 23   < >  --    < > 25 22  --  27  --  25 27  GLUCOSE 121*   < >  --    < > 99 109*  --  96  --  94 105*  BUN 25*   < >  --    < > 15 13  --  16  --  11 13  CREATININE 1.29*   < >  --    < > 0.99 1.02*  --  1.11*  --  0.79 0.91  CALCIUM 8.4*   < >  --    < > 8.7* 8.5*  --  8.7*  --  8.6* 8.8*  MG  --   --  1.8  --   --   --  1.7  --  1.5* 1.4* 1.7  PHOS 3.3  --   --   --   --   --   --   --   --   --   --    < > = values in this interval not displayed.   GFR: Estimated Creatinine Clearance: 52.4 mL/min (by C-G formula based on SCr of 0.91 mg/dL). Liver Function Tests: Recent Labs  Lab 08/17/22 0321 08/19/22 0030  AST 51* 17  ALT 22 12  ALKPHOS 57 41  BILITOT 1.1 0.7  PROT 9.2* 6.2*  ALBUMIN 4.6 3.0*   No results for input(s): "LIPASE", "AMYLASE" in the last 168 hours. No results for input(s): "AMMONIA" in the last 168 hours. Coagulation Profile: No results for input(s): "INR", "PROTIME" in the last 168 hours. Cardiac Enzymes: No results for input(s): "CKTOTAL", "CKMB", "CKMBINDEX", "TROPONINI" in the last 168 hours. BNP (last 3 results) No results for input(s): "PROBNP" in the last 8760 hours. HbA1C: No results for input(s): "HGBA1C" in the last 72 hours. CBG: No results for input(s): "GLUCAP" in the last 168 hours. Lipid Profile: No results for input(s): "CHOL", "HDL", "LDLCALC", "TRIG", "CHOLHDL", "LDLDIRECT" in the last 72 hours. Thyroid Function Tests: No results for input(s): "TSH", "T4TOTAL", "FREET4", "T3FREE", "THYROIDAB" in the last 72 hours. Anemia Panel: No results for input(s): "VITAMINB12", "FOLATE", "FERRITIN", "TIBC", "IRON", "RETICCTPCT" in the last 72 hours. Sepsis Labs: Recent Labs  Lab 08/17/22 0321 08/17/22 0613 08/17/22 2235  PROCALCITON 0.34  --   --   LATICACIDVEN  --  3.0* 0.9    Recent Results (from the past 240 hour(s))  Urine Culture     Status: Abnormal    Collection Time: 08/17/22  2:46 AM   Specimen: Urine, Random  Result Value Ref Range Status   Specimen Description   Final    URINE, RANDOM Performed at [facility], [address], [place], [place] [address]    Special Requests   Final    NONE Performed at [facility], [address] [place],  [address]    Culture MULTIPLE SPECIES PRESENT, SUGGEST RECOLLECTION (A)  Final   Report Status 08/18/2022 FINAL  Final  SARS Coronavirus 2 by RT PCR (hospital order, performed in [facility] hospital lab) *cepheid single result test* Anterior Nasal Swab     Status: None   Collection Time: 08/17/22  5:47 AM   Specimen: Anterior Nasal Swab  Result Value Ref Range Status   SARS Coronavirus 2 by RT PCR NEGATIVE NEGATIVE Final    Comment: (NOTE) SARS-CoV-2 target nucleic acids are NOT DETECTED.  The SARS-CoV-2 RNA is generally detectable in upper and lower respiratory specimens during the acute phase of infection. The lowest concentration of SARS-CoV-2 viral copies this assay can detect is 250 copies / mL. A negative result does not preclude SARS-CoV-2 infection and should not be used as the sole basis for treatment or other patient management decisions.  A negative result may occur with improper specimen collection / handling, submission of specimen other than nasopharyngeal swab, presence of viral mutation(s) within the areas targeted by this assay, and inadequate number of viral copies (<250 copies / mL). A negative result must be combined with clinical observations, patient history, and epidemiological information.  Fact Sheet for Patients:   https://www.patel.info/  Fact Sheet for Healthcare Providers: https://hall.com/  This test is not yet approved or  cleared by the [place] and has been authorized for detection and/or diagnosis of SARS-CoV-2 by FDA under an Emergency Use Authorization (EUA).  This EUA  will remain in effect (meaning this test can be used) for the duration of the COVID-19 declaration under Section 564(b)(1) of the Act, 21 U.S.C. section 360bbb-3(b)(1), unless the authorization is terminated or revoked sooner.  Performed at [facility], [address] [place], [place] [address]   Blood culture (routine x 2)     Status: None   Collection Time: 08/17/22  6:13 AM   Specimen: BLOOD RIGHT HAND  Result Value Ref Range Status   Specimen Description BLOOD RIGHT HAND  Final   Special Requests   Final    BOTTLES DRAWN AEROBIC AND ANAEROBIC Blood Culture adequate volume   Culture   Final    NO GROWTH 5 DAYS Performed at [facility], [address], [place], [place] [address]    Report Status 08/22/2022 FINAL  Final  Blood culture (routine x 2)     Status: None   Collection Time: 08/17/22  6:13 AM   Specimen: BLOOD RIGHT ARM  Result Value Ref Range Status   Specimen Description BLOOD RIGHT ARM  Final   Special Requests   Final    BOTTLES DRAWN AEROBIC AND ANAEROBIC Blood Culture results may not be optimal due to an inadequate volume of blood received in culture bottles   Culture   Final    NO GROWTH 5 DAYS Performed at [facility], [address], [place],  [address]    Report Status 08/22/2022 FINAL  Final         Radiology Studies: No results found.      Scheduled Meds:  amLODipine  5 mg Oral Daily   aspirin EC  81 mg Oral BID   bisacodyl  10 mg Rectal Q1200   Chlorhexidine Gluconate Cloth  6 each Topical Daily   cholecalciferol  5,000 Units Oral Daily   diclofenac Sodium  4 g Topical QID   docusate sodium  100 mg Oral BID   enoxaparin (LOVENOX) injection  40 mg Subcutaneous Q24H   ferrous sulfate  325 mg Oral Q breakfast   FLUoxetine  20 mg Oral Daily   levothyroxine  75 mcg Oral QAC breakfast   LORazepam  0.5 mg Oral Daily   magnesium oxide  400 mg Oral Daily   metoprolol succinate  50 mg Oral BID    multivitamin with minerals  1 tablet Oral Daily   Muscle Rub  1 Application Topical BID   pantoprazole  40 mg Oral Daily   sodium chloride flush  10-40 mL Intracatheter Q12H   Continuous Infusions:  sodium chloride 75 mL/hr at 08/23/22 1045   promethazine (PHENERGAN) injection (IM or IVPB) 200 mL/hr at 08/22/22 1512     LOS: 6 days   Time spent= 35 mins    Chalmers Iddings Arsenio Loader, MD [facility]  If 7PM-7AM, please contact night-coverage  08/23/2022, 12:02 PM

## 2022-08-23 NOTE — Progress Notes (Signed)
Occupational Therapy Treatment Patient Details Name: Cindy Huerta MRN: [id number] DOB: [birth year] Today's Date: 08/23/2022   History of present illness Pt is a 76 year old female presenting following fall and dysruia. PMH significant for HTN, hyperlipidemia, TIA, GERD, hypertension, depression with anxiety, CKD-3b, GI bleeding, dCHF, hyponatremia. Further workup showed "nondisplaced subchondral fracture of the  posterolateral tibial plateau, Intrasubstance degeneration of the medial meniscus with probable  nondisplaced degenerative tear of the meniscal body."   OT comments  Patient agreeable to OT/PT co-treatment to maximize safety and participation. Agreeable to sit up in recliner. Pt completed bed mobility with supervision and reported mild dizzines once sitting at EOB, however, improved with time and vital signs stable. Pt then required Min guard +2 for step pivot t/f to recliner with Min A for RW management. She required max multimodal cuing for sequencing tasks and repetition of one step commands this date. Pt left sitting up in recliner with all needs in reach. Pt is making progress toward goal completion. D/C recommendation remains appropriate. OT will continue to follow acutely.        Recommendations for follow up therapy are one component of a multi-disciplinary discharge planning process, led by the attending physician.  Recommendations may be updated based on patient status, additional functional criteria and insurance authorization.    Follow Up Recommendations  Skilled nursing-short term rehab (<3 hours/day)    Assistance Recommended at Discharge Frequent or constant Supervision/Assistance  Patient can return home with the following  A lot of help with walking and/or transfers;A lot of help with bathing/dressing/bathroom   Equipment Recommendations  Other (comment) (defer to next venue of care)    Recommendations for Other Services      Precautions / Restrictions  Precautions Precautions: Fall Required Braces or Orthoses: Knee Immobilizer - Left Knee Immobilizer - Left: Other (comment) (required for weightbearing, Can remove for PROM) Restrictions Weight Bearing Restrictions: Yes LLE Weight Bearing: Weight bearing as tolerated Other Position/Activity Restrictions: "Ice and gentle passive range of motion as tolerated." Per Dr. Sabra Heck       Mobility Bed Mobility Overal bed mobility: Needs Assistance Bed Mobility: Supine to Sit     Supine to sit: Supervision, HOB elevated     General bed mobility comments: increased time, verbal/tactile cues for sequencing steps    Transfers Overall transfer level: Needs assistance Equipment used: Rolling walker (2 wheels) Transfers: Sit to/from Stand, Bed to chair/wheelchair/BSC Sit to Stand: +2 safety/equipment, Min guard, From elevated surface     Step pivot transfers: Min guard, +2 safety/equipment     General transfer comment: verbal cues for hand placement, Min A for RW management     Balance Overall balance assessment: Needs assistance Sitting-balance support: Feet supported, Bilateral upper extremity supported Sitting balance-Leahy Scale: Fair Sitting balance - Comments: supervision   Standing balance support: Reliant on assistive device for balance Standing balance-Leahy Scale: Poor                             ADL either performed or assessed with clinical judgement   ADL Overall ADL's : Needs assistance/impaired                     Lower Body Dressing: Maximal assistance;Sitting/lateral leans Lower Body Dressing Details (indicate cue type and reason): to don/doff socks Toilet Transfer: Min guard;+2 for safety/equipment;Rolling walker (2 wheels) Toilet Transfer Details (indicate cue type and reason): simulated with step pivot t/f  from EOB > recliner         Functional mobility during ADLs: Min guard;Rolling walker (2 wheels);Cueing for sequencing;Cueing for  safety;+2 for safety/equipment (Min A for RW management)      Extremity/Trunk Assessment Upper Extremity Assessment Upper Extremity Assessment: Generalized weakness   Lower Extremity Assessment Lower Extremity Assessment: Generalized weakness   Cervical / Trunk Assessment Cervical / Trunk Assessment: Normal    Vision Patient Visual Report: No change from baseline Vision Assessment?: No apparent visual deficits   Perception     Praxis      Cognition Arousal/Alertness: Awake/alert Behavior During Therapy: Anxious Overall Cognitive Status: Difficult to assess Area of Impairment: Attention, Following commands, Safety/judgement, Awareness, Problem solving, Orientation, Memory                 Orientation Level: Disoriented to, Situation Current Attention Level: Sustained Memory: Decreased short-term memory Following Commands: Follows one step commands with increased time Safety/Judgement: Decreased awareness of deficits, Decreased awareness of safety Awareness: Emergent Problem Solving: Slow processing, Decreased initiation, Difficulty sequencing, Requires verbal cues, Requires tactile cues General Comments: Pt alert and cooperative. Oriented to month and that she is in the "hospital". Increased time for processing. Required repetition of questions and one step commands throughout. Demonstrated fear with OOB mobility this date, reported not feeling comfortable having staff t/f her to [facility] later today to complete toileting. At end of session pt stating, "are you leaving me?".        Exercises      Shoulder Instructions       General Comments      Pertinent Vitals/ Pain       Pain Assessment Pain Assessment: No/denies pain  Home Living                                          Prior Functioning/Environment              Frequency  Min 2X/week        Progress Toward Goals  OT Goals(current goals can now be found in the care plan  section)  Progress towards OT goals: Progressing toward goals  Acute Rehab OT Goals OT Goal Formulation: With patient Time For Goal Achievement: 09/02/22 Potential to Achieve Goals: Good  Plan Discharge plan remains appropriate;Frequency remains appropriate    Co-evaluation    PT/OT/SLP Co-Evaluation/Treatment: Yes Reason for Co-Treatment: Complexity of the patient's impairments (multi-system involvement);Necessary to address cognition/behavior during functional activity;For patient/therapist safety;To address functional/ADL transfers PT goals addressed during session: Mobility/safety with mobility;Balance;Proper use of DME;Strengthening/ROM OT goals addressed during session: ADL's and self-care      AM-PAC OT "6 Clicks" Daily Activity     Outcome Measure   Help from another person eating meals?: A Little Help from another person taking care of personal grooming?: A Little Help from another person toileting, which includes using toliet, bedpan, or urinal?: A Lot Help from another person bathing (including washing, rinsing, drying)?: A Little Help from another person to put on and taking off regular upper body clothing?: A Little Help from another person to put on and taking off regular lower body clothing?: A Lot 6 Click Score: 16    End of Session Equipment Utilized During Treatment: Rolling walker (2 wheels);Other (comment)  OT Visit Diagnosis: Unsteadiness on feet (R26.81);Other abnormalities of gait and mobility (R26.89);Muscle weakness (generalized) (M62.81)   Activity Tolerance Patient  tolerated treatment well   Patient Left with call bell/phone within reach;in chair;with chair alarm set   Nurse Communication Mobility status        Time: 1005-1035 OT Time Calculation (min): 30 min  Charges: OT General Charges $OT Visit: 1 Visit OT Treatments $Self Care/Home Management : 8-22 mins   [facility] MS, OTR/L ascom [phone]  08/23/22, 1:18 PM

## 2022-08-23 NOTE — Progress Notes (Signed)
Physical Therapy Treatment Patient Details Name: Cindy Huerta MRN: [id number] DOB: [birth year] Today's Date: 08/23/2022   History of Present Illness Pt is a 76 year old female presenting following fall and dysruia. PMH significant for HTN, hyperlipidemia, TIA, GERD, hypertension, depression with anxiety, CKD-3b, GI bleeding, dCHF, hyponatremia. Further workup showed "nondisplaced subchondral fracture of the  posterolateral tibial plateau, Intrasubstance degeneration of the medial meniscus with probable  nondisplaced degenerative tear of the meniscal body."    PT Comments    Pt received upright in bed agreeable to PT/OT co-treat for pt and therapist safety. Pt making improvements with mobility this date demonstrating ability to transfer from supine to sitting EoB with increased time and supervision with min multimodal cues for UE/LE sequencing. Pt reports mild dizziness sitting EOB with orthostatics negative in sitting and pt reports improvement in symptoms after 2-3 minutes seated. Pt able to stand minguard +2 with bed elevated  and VC's for hand placement performing step pivot to recliner with minA on RW for assistance in sequencing. Pt seated in recliner with LE's elevated on 2 pillows. Educated on LLE elevation and ankle mobility to assist in edema management. Pt verbalizes understanding. Pt does remain with slow processing and does require repetition of questions asked for pt to answer successfully. Pt remains appropriate for STR at this time due to inability to tolerate gait at this time. All needs in reach in recliner.    Recommendations for follow up therapy are one component of a multi-disciplinary discharge planning process, led by the attending physician.  Recommendations may be updated based on patient status, additional functional criteria and insurance authorization.  Follow Up Recommendations  Skilled nursing-short term rehab (<3 hours/day) Can patient physically be transported by  private vehicle: No   Assistance Recommended at Discharge Frequent or constant Supervision/Assistance  Patient can return home with the following Two people to help with walking and/or transfers;Two people to help with bathing/dressing/bathroom;Direct supervision/assist for medications management;Help with stairs or ramp for entrance;Assist for transportation;Assistance with cooking/housework;Direct supervision/assist for financial management   Equipment Recommendations  Other (comment) (tbd by next venue of care)    Recommendations for Other Services       Precautions / Restrictions Precautions Precautions: Fall Required Braces or Orthoses: Knee Immobilizer - Left Knee Immobilizer - Left: Other (comment) (required for weightbearing, Can remove for PROM) Restrictions Weight Bearing Restrictions: Yes LLE Weight Bearing: Weight bearing as tolerated     Mobility  Bed Mobility Overal bed mobility: Needs Assistance Bed Mobility: Supine to Sit     Supine to sit: Supervision, HOB elevated     General bed mobility comments: able to perform with bed features and increased time with min TC's on UE/LE's for improvement in sequencing. Patient Response: Cooperative, Flat affect  Transfers Overall transfer level: Needs assistance Equipment used: Rolling walker (2 wheels) Transfers: Sit to/from Stand, Bed to chair/wheelchair/BSC Sit to Stand: +2 safety/equipment, Min guard, From elevated surface   Step pivot transfers: Min guard, +2 safety/equipment       General transfer comment: VC's for hand placement. Relies on elevated bed to stand without physical assist. minA on RW for sequencing with transfer.    Ambulation/Gait     Assistive device: Rolling walker (2 wheels)             Stairs             Wheelchair Mobility    Modified Rankin (Stroke Patients Only)       Balance Overall balance  assessment: Needs assistance Sitting-balance support: Feet supported,  Bilateral upper extremity supported Sitting balance-Leahy Scale: Fair Sitting balance - Comments: supervision   Standing balance support: Reliant on assistive device for balance Standing balance-Leahy Scale: Poor                              Cognition Arousal/Alertness: Awake/alert Behavior During Therapy: WFL for tasks assessed/performed Overall Cognitive Status: Difficult to assess                                 General Comments: Pt alert and cooperative. Remains with slow processing, requires asking question (s) multiple times for answer as pt appears to forget on occasion when asked a question.        Exercises Other Exercises Other Exercises: Education provided on LE therex and elevation of LLE to assist in edema management.    General Comments        Pertinent Vitals/Pain Pain Assessment Pain Assessment: No/denies pain    Home Living                          Prior Function            PT Goals (current goals can now be found in the care plan section) Acute Rehab PT Goals Patient Stated Goal: get better PT Goal Formulation: With patient Time For Goal Achievement: 09/01/22 Potential to Achieve Goals: Good Progress towards PT goals: Progressing toward goals    Frequency    Min 2X/week      PT Plan Current plan remains appropriate    Co-evaluation PT/OT/SLP Co-Evaluation/Treatment: Yes Reason for Co-Treatment: Complexity of the patient's impairments (multi-system involvement);Necessary to address cognition/behavior during functional activity;For patient/therapist safety PT goals addressed during session: Mobility/safety with mobility;Balance;Proper use of DME;Strengthening/ROM OT goals addressed during session: ADL's and self-care      AM-PAC PT "6 Clicks" Mobility   Outcome Measure  Help needed turning from your back to your side while in a flat bed without using bedrails?: A Lot Help needed moving from lying on  your back to sitting on the side of a flat bed without using bedrails?: A Lot Help needed moving to and from a bed to a chair (including a wheelchair)?: A Lot Help needed standing up from a chair using your arms (e.g., wheelchair or bedside chair)?: A Little Help needed to walk in hospital room?: Total Help needed climbing 3-5 steps with a railing? : Total 6 Click Score: 11    End of Session Equipment Utilized During Treatment: Gait belt Activity Tolerance: Patient tolerated treatment well Patient left: in chair;with call bell/phone within reach;with chair alarm set Nurse Communication: Mobility status PT Visit Diagnosis: Unsteadiness on feet (R26.81);Difficulty in walking, not elsewhere classified (R26.2)     Time: 1937-9024 PT Time Calculation (min) (ACUTE ONLY): 28 min  Charges:  $Therapeutic Activity: 8-22 mins                    Sherrill Mckamie M. Fairly IV, PT, DPT Physical Therapist- [facility]  08/23/2022, 11:02 AM

## 2022-08-24 DIAGNOSIS — E871 Hypo-osmolality and hyponatremia: Secondary | ICD-10-CM | POA: Diagnosis not present

## 2022-08-24 LAB — URINALYSIS, COMPLETE (UACMP) WITH MICROSCOPIC
Bacteria, UA: NONE SEEN
Bilirubin Urine: NEGATIVE
Glucose, UA: NEGATIVE mg/dL
Hgb urine dipstick: NEGATIVE
Ketones, ur: NEGATIVE mg/dL
Leukocytes,Ua: NEGATIVE
Nitrite: NEGATIVE
Protein, ur: NEGATIVE mg/dL
Specific Gravity, Urine: 1.01 (ref 1.005–1.030)
pH: 7 (ref 5.0–8.0)

## 2022-08-24 LAB — CBC
HCT: 29 % — ABNORMAL LOW (ref 36.0–46.0)
Hemoglobin: 9.8 g/dL — ABNORMAL LOW (ref 12.0–15.0)
MCH: 30.1 pg (ref 26.0–34.0)
MCHC: 33.8 g/dL (ref 30.0–36.0)
MCV: 89 fL (ref 80.0–100.0)
Platelets: 272 10*3/uL (ref 150–400)
RBC: 3.26 MIL/uL — ABNORMAL LOW (ref 3.87–5.11)
RDW: 13.2 % (ref 11.5–15.5)
WBC: 10.3 10*3/uL (ref 4.0–10.5)
nRBC: 0 % (ref 0.0–0.2)

## 2022-08-24 LAB — BASIC METABOLIC PANEL
Anion gap: 7 (ref 5–15)
BUN: 13 mg/dL (ref 8–23)
CO2: 27 mmol/L (ref 22–32)
Calcium: 8.6 mg/dL — ABNORMAL LOW (ref 8.9–10.3)
Chloride: 101 mmol/L (ref 98–111)
Creatinine, Ser: 0.85 mg/dL (ref 0.44–1.00)
GFR, Estimated: >60 mL/min (ref >60)
Glucose, Bld: 107 mg/dL — ABNORMAL HIGH (ref 70–99)
Potassium: 3.3 mmol/L — ABNORMAL LOW (ref 3.5–5.1)
Sodium: 135 mmol/L (ref 135–145)

## 2022-08-24 LAB — MAGNESIUM: Magnesium: 1.6 mg/dL — ABNORMAL LOW (ref 1.7–2.4)

## 2022-08-24 IMAGING — CT CT HEAD W/O CM
3 of 4 series · 14 of 47 positions shown, 16 images · non-contrast
Comparison: None.

CLINICAL DATA: Headache, hypertension

EXAM:
CT HEAD WITHOUT CONTRAST
TECHNIQUE: Contiguous axial images were obtained from the base of the skull
through the vertex without intravenous contrast.

[Series 4: head 2.0 h70h · axial · 0.46mm/px · z∈[-56,+72]mm · 8 of 80 slices shown, 10 images]
[im 8/80  brain]
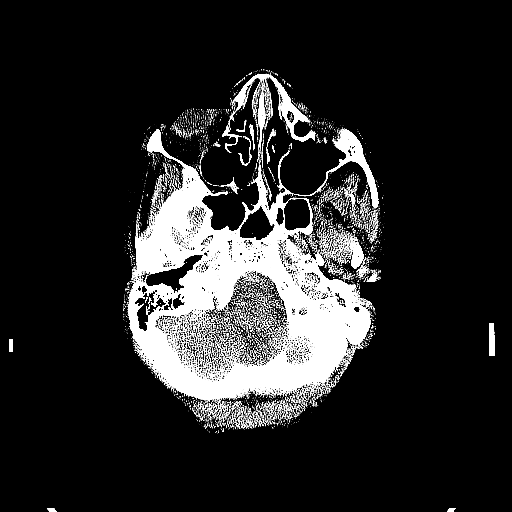
[im 8/80  bone]
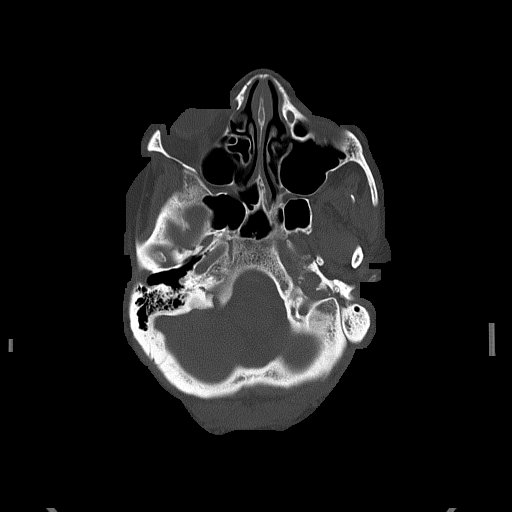
[im 16/80  brain]
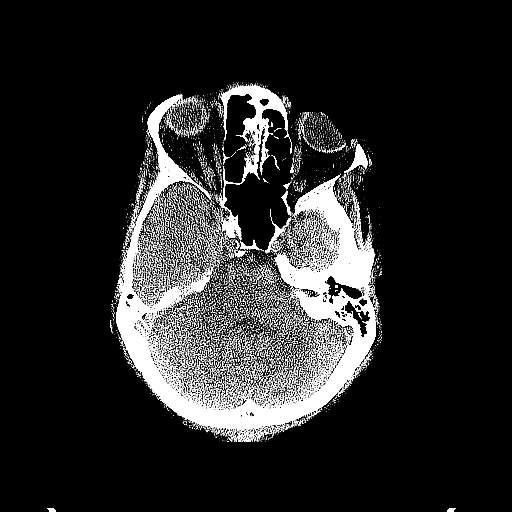
[im 24/80  brain]
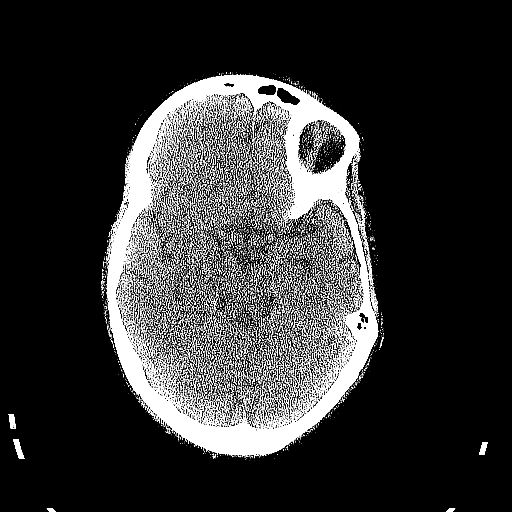
[im 36/80  brain]
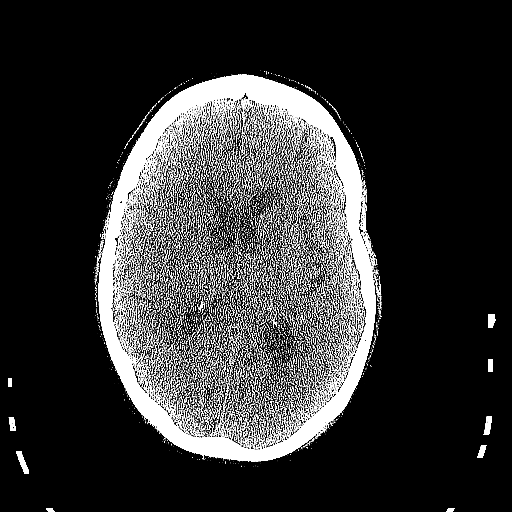
[im 44/80  brain]
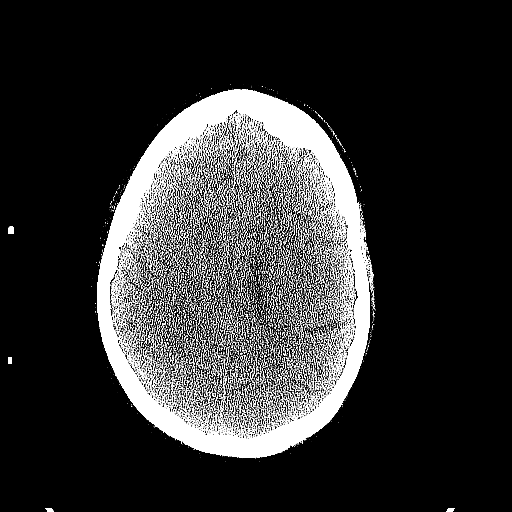
[im 44/80  bone]
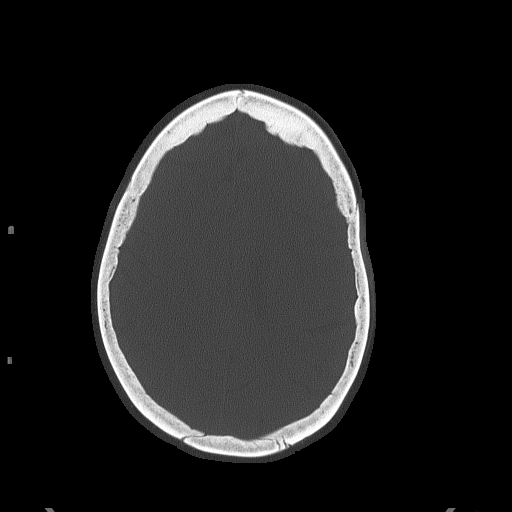
[im 56/80  brain]
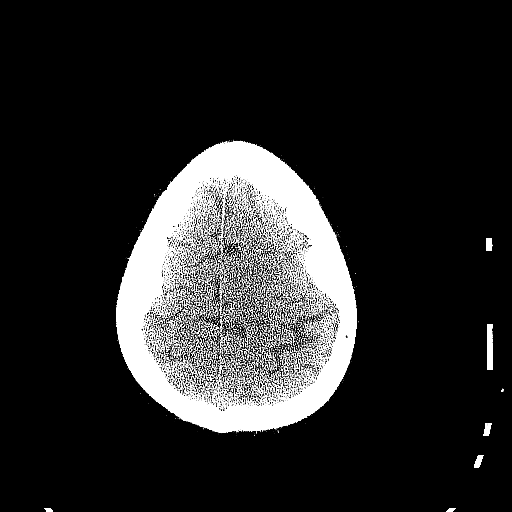
[im 64/80  brain]
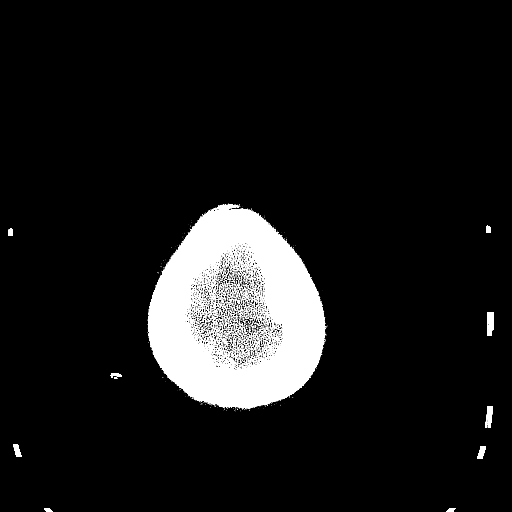
[im 72/80  brain]
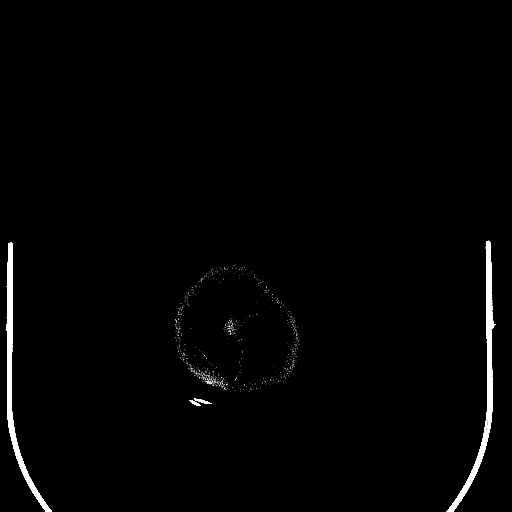

[Series 5: head 3.0 mpr cor · coronal · 0.29mm/px · 3 of 73 slices shown]
[im 25/73  brain]
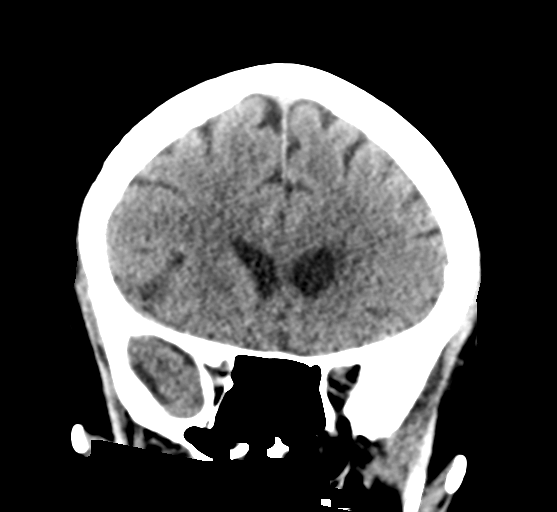
[im 33/73  brain]
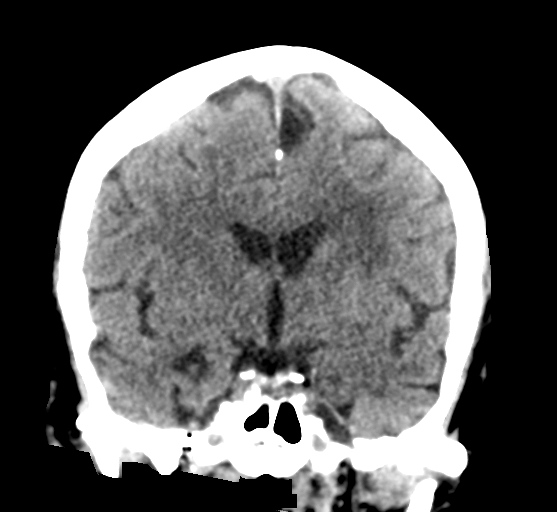
[im 41/73  brain]
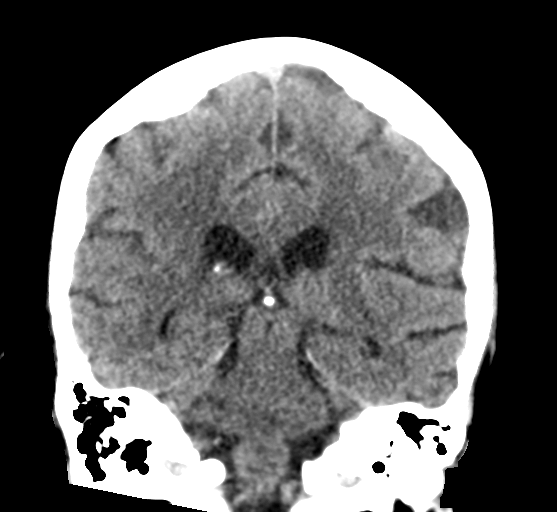

[Series 6: head 3.0 mpr sag · sagittal · 0.31mm/px · 3 of 59 slices shown]
[im 27/59  brain]
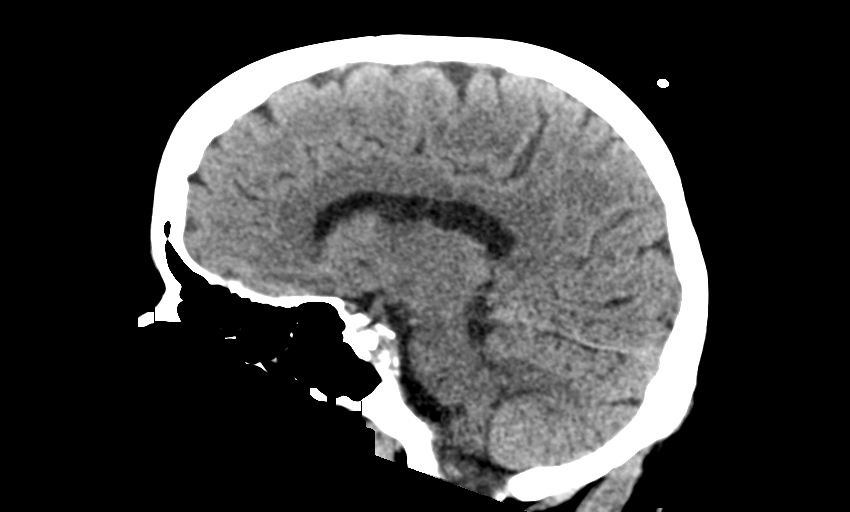
[im 33/59  brain]
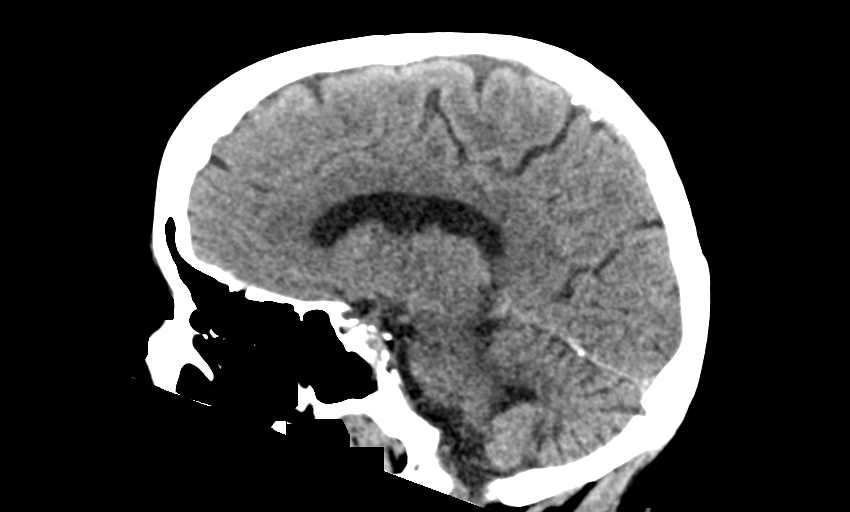
[im 38/59  brain]
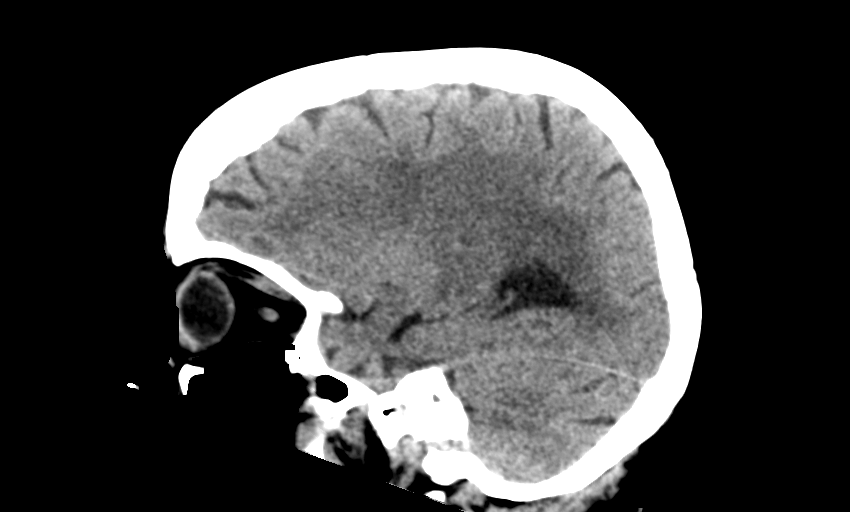

[14 of 47 positions shown; findings below may reference images not displayed]

FINDINGS: Brain: No evidence of acute infarction, hemorrhage, hydrocephalus,
extra-axial collection or mass lesion/mass effect. Small old lacunar
infarct in the right caudate head. Mild low-density changes within
the periventricular and subcortical white matter compatible with
chronic microvascular ischemic change. Mild diffuse cerebral volume
loss.

Vascular: Atherosclerotic calcifications involving the large vessels
of the skull base. No unexpected hyperdense vessel.

Skull: Normal. Negative for fracture or focal lesion.

Sinuses/Orbits: No acute finding.

Other: None.
IMPRESSION: 1. No acute intracranial findings.
2. Mild chronic microvascular ischemic change and cerebral volume
loss.

## 2022-08-24 MED ORDER — POTASSIUM CHLORIDE CRYS ER 20 MEQ PO TBCR
40.0000 meq | EXTENDED_RELEASE_TABLET | Freq: Once | ORAL | Status: AC
  Administered 2022-08-24: 40 meq via ORAL
  Filled 2022-08-24: qty 2

## 2022-08-24 MED ORDER — MAGNESIUM OXIDE -MG SUPPLEMENT 400 (240 MG) MG PO TABS
800.0000 mg | ORAL_TABLET | Freq: Once | ORAL | Status: AC
  Administered 2022-08-24: 800 mg via ORAL

## 2022-08-24 MED ORDER — AMLODIPINE BESYLATE 10 MG PO TABS
10.0000 mg | ORAL_TABLET | Freq: Every day | ORAL | Status: DC
  Administered 2022-08-24: 10 mg via ORAL
  Filled 2022-08-24: qty 1

## 2022-08-24 MED ORDER — AMLODIPINE BESYLATE 10 MG PO TABS: 10.0000 mg | ORAL_TABLET | Freq: Every day | ORAL | Status: DC

## 2022-08-24 NOTE — Progress Notes (Signed)
Follow up visit with Cindy Huerta, she did not remember me, we did have an enjoyable conversation. Provided presence and support for this patient.

## 2022-08-24 NOTE — Discharge Summary (Signed)
Physician Discharge Summary  Cindy Huerta WEX:[id number] DOB: 17-Jul-1946 DOA: 08/17/2022  PCP: Juluis Pitch, MD  Admit date: 08/17/2022 Discharge date: 08/24/2022  Admitted From: Home Disposition:  Home  Recommendations for Outpatient Follow-up:  Follow up with PCP in 1-2 weeks Please obtain BMP/CBC in one week your next doctors visit.  Knee immobilizer in place especially with ambulation.  Ice, rest, compression stockings.  Outpatient follow-up with orthopedic, Dr. Sabra Heck in 2 weeks.  Aspirin 81 mg twice daily for DVT prophylaxis for 2 weeks followed by daily Clonidine discontinued.  Norvasc increased to 10 mg.  Continue Micardis.  Further adjust medication depending on blood pressure response.  Discharge Condition: Stable CODE STATUS: DNR Diet recommendation: 2gsalt`  Brief/Interim Summary: 76 y.o. female with medical history significant of HTN, hyperlipidemia, TIA, GERD, hypertension, depression with anxiety, CKD-3b, GI bleeding, dCHF, hyponatremia, who presents with fall and dysuria.  In the ER patient was noted to be clinically dehydrated with hyponatremia.  CT head and cervical spine was negative.  X-ray of the left knee was also unremarkable.  Hyponatremia was initially suspected due to dehydration requiring IV fluids thereafter there was some component of SIADH therefore placed on fluid restriction She is also reporting of significant left knee pain, x-ray negative.  Due to swelling, MRI of the knee performed showed nondisplaced subchondral fracture, large joint effusion, severe arthritis.  Orthopedic team was consulted for their input.  Orthopedic recommended Ace bandage, knee immobilizer and apply ice.  Partial weightbearing as tolerated on the left leg with a walker and follow-up in 2 weeks.  Aspirin 81 mg twice daily for DVT prophylaxis. Due to persistent dizziness she received IV fluids, clonidine was discontinued.  For elevated blood pressure, Norvasc was increased and  Micardis was restarted. Today patient is medically stable for discharge.     Assessment & Plan:  Principal Problem:   Hyponatremia Active Problems:   Acute renal failure superimposed on stage 3b chronic kidney disease (HCC)   UTI (urinary tract infection)   Chronic diastolic CHF (congestive heart failure) (HCC)   Elevated lactic acid level   HTN (hypertension)   Hypothyroidism   Iron deficiency anemia   Fall at home, initial encounter   Abnormal LFTs   HLD (hyperlipidemia)   History of TIA (transient ischemic attack)   Depression with anxiety       Assessment and Plan:     Acute renal failure superimposed on stage 3b chronic kidney disease ([place]), resolved Recent baseline creatinine 0.98 on 07/30/1972.  Her creatinine is 1.65, improving.  Creatinine today 0.8   * Hyponatremia; concerns for SIADH- improved and stable.  Admission sodium 125, this morning sodium is 135.  Initially treated with fluid restriction.  We will discontinue salt tablets   Hypokalemia/hypomagnesemia - Repletion as needed   UTI (urinary tract infection) Cultures are now negative, patient asymptomatic.  Completed 3-day course of cefepime/Rocephin.   Dizziness/Syncope Improved.  Clonidine discontinued for now.   Left knee pain and swelling -XR is negative.  Avoid narcotics per son.  Voltaren gel ordered -MRI left knee done-MRI of the knee performed showed nondisplaced subchondral fracture, large joint effusion, severe arthritis.  Seen by orthopedic team recommended knee immobilizer, Ace wrap, apply ice.  Aspirin 81 mg twice daily for DVT prophylaxis and follow-up in 2 weeks. Thereafter patient can go back to regular aspirin 81 mg daily.   Chronic diastolic CHF (congestive heart failure) ([place]) 2D echo 07/29/2021 showed EF of 55-60% with grade 2 diastolic dysfunction.  Patient  does not have leg edema or JVD.  CHF is compensated.  No pulm edema chest x-ray.   Elevated lactic acid level Resolved.    HTN  (hypertension) I discontinued clonidine due to dizziness.  Norvasc increased to 10 mg daily.  Continue Micardis and metoprolol   Hypothyroidism - Synthroid   Iron deficiency anemia Hb is stable.  -Continue ferrous sulfate   Fall at home, initial encounter Fall and questionable syncope: CT head negative.  CT of C-spine negative.  No focal neurodeficit on physical examination. PT/OT = SNF   Abnormal LFTs Resolved   HLD (hyperlipidemia) - Lipitor   History of TIA (transient ischemic attack) - Aspirin, Lipitor   Depression with anxiety - Continue home medications      Body mass index is 26.33 kg/m.       Discharge Diagnoses:  Principal Problem:   Hyponatremia Active Problems:   Acute renal failure superimposed on stage 3b chronic kidney disease (HCC)   UTI (urinary tract infection)   Chronic diastolic CHF (congestive heart failure) (HCC)   Elevated lactic acid level   HTN (hypertension)   Hypothyroidism   Iron deficiency anemia   Fall at home, initial encounter   Abnormal LFTs   HLD (hyperlipidemia)   History of TIA (transient ischemic attack)   Depression with anxiety      Consultations: Orthopedic  Subjective: Feels ok, no new complaints.   Discharge Exam: Vitals:   08/24/22 0535 08/24/22 0738  BP: (!) 167/75 (!) 167/73  Pulse: 69 66  Resp: 19 18  Temp: 98.7 F (37.1 C) 98.4 F (36.9 C)  SpO2: 94% 97%   Vitals:   08/23/22 1627 08/23/22 1956 08/24/22 0535 08/24/22 0738  BP: (!) 163/76 (!) 174/69 (!) 167/75 (!) 167/73  Pulse: 62 64 69 66  Resp: '18 16 19 18  '$ Temp: 99.4 F (37.4 C) 98.5 F (36.9 C) 98.7 F (37.1 C) 98.4 F (36.9 C)  TempSrc:      SpO2: 100% 100% 94% 97%  Weight:      Height:        General: Pt is alert, awake, not in acute distress Cardiovascular: RRR, S1/S2 +, no rubs, no gallops Respiratory: CTA bilaterally, no wheezing, no rhonchi Abdominal: Soft, NT, ND, bowel sounds + Extremities: no edema, no cyanosis Left  knee immobilizer in place.   Discharge Instructions   Allergies as of 08/24/2022       Reactions   Latex Rash   Novocain [procaine] Other (See Comments)   Unsure - told by DDS not to let anyone give it to her  Confusion   Zestril [lisinopril] Cough   Benadryl [diphenhydramine] Other (See Comments)   Jitteriness Agitation   Biaxin [clarithromycin] Other (See Comments)   Confusion   Roxicodone [oxycodone] Nausea And Vomiting, Anxiety        Medication List     STOP taking these medications    cloNIDine 0.1 MG tablet Commonly known as: CATAPRES   potassium chloride SA 20 MEQ tablet Commonly known as: KLOR-CON M   vitamin B-12 100 MCG tablet Commonly known as: CYANOCOBALAMIN       TAKE these medications    acetaminophen 500 MG tablet Commonly known as: TYLENOL Take 500-1,000 mg by mouth every 6 (six) hours as needed for moderate pain.   alum & mag hydroxide-simeth 200-200-20 MG/5ML suspension Commonly known as: MAALOX/MYLANTA Take 30 mLs by mouth every 6 (six) hours as needed for indigestion or heartburn.   amLODipine 10 MG tablet  Commonly known as: NORVASC Take 1 tablet (10 mg total) by mouth daily. Start taking on: August 25, 2022 What changed:  medication strength how much to take   Artificial Tears 0.1-0.3 % Soln Generic drug: Dextran 70-Hypromellose Place 1 drop into both eyes daily as needed for dry eyes.   aspirin EC 81 MG tablet Take 1 tablet (81 mg total) by mouth daily. RESTART 48HRS AFTER DISCHARGE   atorvastatin 80 MG tablet Commonly known as: LIPITOR Take 80 mg by mouth daily.   Biofreeze 4 % Gel Generic drug: Menthol (Topical Analgesic) Apply 1 application  topically 2 (two) times daily.   bisacodyl 10 MG suppository Commonly known as: DULCOLAX Place 1 suppository (10 mg total) rectally daily at 12 noon.   docusate sodium 100 MG capsule Commonly known as: COLACE Take 1 capsule (100 mg total) by mouth 2 (two) times daily.    Ensure Nutrition Shake Liqd Take 1 Bottle by mouth 2 (two) times daily as needed (loss of appetite).   FLUoxetine 20 MG capsule Commonly known as: PROZAC Take 20 mg by mouth daily.   fluticasone 50 MCG/ACT nasal spray Commonly known as: FLONASE Place 1 spray into both nostrils daily as needed for allergies or rhinitis.   IRON PO Take 1 tablet by mouth daily.   levothyroxine 75 MCG tablet Commonly known as: SYNTHROID Take 75 mcg by mouth daily before breakfast.   LORazepam 0.5 MG tablet Commonly known as: ATIVAN Take 1 tablet (0.5 mg total) by mouth daily.   magnesium oxide 400 MG tablet Commonly known as: MAG-OX Take 1 tablet (400 mg total) by mouth daily.   meclizine 12.5 MG tablet Commonly known as: ANTIVERT Take 12.5 mg by mouth 3 (three) times daily as needed for dizziness.   metoprolol succinate 50 MG 24 hr tablet Commonly known as: TOPROL-XL Take 50 mg by mouth 2 (two) times daily.   ondansetron 4 MG disintegrating tablet Commonly known as: ZOFRAN-ODT Take 1 tablet (4 mg total) by mouth every 8 (eight) hours as needed for nausea or vomiting.   pantoprazole 40 MG tablet Commonly known as: PROTONIX Take 1 tablet (40 mg total) by mouth daily.   polyethylene glycol powder 17 GM/SCOOP powder Commonly known as: GLYCOLAX/MIRALAX Take 1 capful (17 g) with water by mouth daily.   telmisartan 80 MG tablet Commonly known as: MICARDIS Take 1 tablet (80 mg total) by mouth daily.   Thera-M Tabs Take 1 tablet by mouth daily.   traMADol 50 MG tablet Commonly known as: Ultram Take 1 tablet (50 mg total) by mouth every 6 (six) hours as needed for up to 3 days for moderate pain.   trolamine salicylate 10 % cream Commonly known as: ASPERCREME Apply 1 application topically as needed for muscle pain.   VITAMIN D-3 PO Take 1 capsule by mouth daily.        Follow-up Information     Earnestine Leys, MD. Schedule an appointment as soon as possible for a visit in 2  week(s).   Specialty: Orthopedic Surgery Why: For re-evaluation Please make an appointment for 2 weeks in my office prior to the patient's discharge Contact information: [place] [address] [phone]         Earnestine Leys, MD .   Specialty: Orthopedic Surgery Contact information: [place] [address] [phone]         Juluis Pitch, MD Follow up in 1 week(s).   Specialty: Family Medicine Contact information: [phone]  [place] [address] [phone]                Allergies  Allergen Reactions   Latex Rash   Novocain [Procaine] Other (See Comments)    Unsure - told by DDS not to let anyone give it to her  Confusion    Zestril [Lisinopril] Cough   Benadryl [Diphenhydramine] Other (See Comments)    Jitteriness Agitation   Biaxin [Clarithromycin] Other (See Comments)    Confusion     Roxicodone [Oxycodone] Nausea And Vomiting and Anxiety    You were cared for by a hospitalist during your hospital stay. If you have any questions about your discharge medications or the care you received while you were in the hospital after you are discharged, you can call the unit and asked to speak with the hospitalist on call if the hospitalist that took care of you is not available. Once you are discharged, your primary care physician will handle any further medical issues. Please note that no refills for any discharge medications will be authorized once you are discharged, as it is imperative that you return to your primary care physician (or establish a relationship with a primary care physician if you do not have one) for your aftercare needs so that they can reassess your need for medications and monitor your lab values.   Procedures/Studies: MR KNEE LEFT WO CONTRAST  Result Date: 08/20/2022 CLINICAL DATA:  Knee pain, chronic, positive xray (Age >= 5y) EXAM: MRI OF THE LEFT KNEE WITHOUT CONTRAST  TECHNIQUE: Multiplanar, multisequence MR imaging of the knee was performed. No intravenous contrast was administered. COMPARISON:  None Available. FINDINGS: MENISCI Medial: Intrasubstance degeneration with horizontal signal likely extending to the undersurface at the medial meniscal body. Lateral: No evidence of lateral meniscus tear. LIGAMENTS Cruciates: ACL and PCL are intact. Collaterals: Medial collateral ligament is intact. Lateral collateral ligament complex is intact. CARTILAGE Patellofemoral: Full-thickness cartilage loss with articular surface irregularity, most prominent along the lateral patellar facet and lateral trochlea. Mild underlying subchondral marrow edema. Medial: There is surface fibrillation with moderate partial-thickness cartilage loss. Lateral:  Mild chondrosis.  No focal chondral defect. JOINT: Large joint effusion with synovitis and fluid-fluid level noted along the lateral recess. POPLITEAL FOSSA: Moderate-sized Baker's cyst with fluid-fluid level. EXTENSOR MECHANISM: There is tendinosis of the distal patellar tendon. BONES: There is a nondisplaced subchondral fracture of the posterolateral tibial plateau with mild associated marrow edema (coronal T1 image 12). No articular surface depression. No aggressive osseous lesion. Tricompartment osteophyte formation. Other: There is soft tissue swelling along the knee. There is no focal fluid collection. IMPRESSION: Nondisplaced subchondral fracture of the posterior aspect of the lateral tibial plateau, with mild associated marrow edema. Large joint effusion and moderate-sized Baker's cyst, with synovitis and a fluid-fluid level suggesting a component of hemarthrosis. 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 Intrasubstance degeneration of the medial meniscus with probable nondisplaced degenerative tear of the meniscal body. Intact cruciate and collateral ligaments.  Electronically Signed   By: Maurine Simmering M.D.   On: 08/20/2022 08:17   IR Fluoro Guide CV Line Left  Result Date: 08/17/2022 INDICATION: 76 year old woman with sepsis and poor venous access presents to IR for central line placement. EXAM: 1. Ultrasound-guided access of right internal jugular vein. 2. Ultrasound-guided access of left internal jugular vein. 3. Left internal jugular vein non tunneled triple-lumen central line placement. MEDICATIONS: None ANESTHESIA/SEDATION: None FLUOROSCOPY: Radiation Exposure Index (as provided by  the fluoroscopic device): 84 mGy Kerma COMPLICATIONS: None immediate. PROCEDURE: Informed written consent was obtained from the patient after a thorough discussion of the procedural risks, benefits and alternatives. All questions were addressed. Maximal Sterile Barrier Technique was utilized including caps, mask, sterile gowns, sterile gloves, sterile drape, hand hygiene and skin antiseptic. A timeout was performed prior to the initiation of the procedure. Ultrasound image documenting patency of the right internal jugular vein was obtained and placed in permanent medical record. Sterile ultrasound probe cover and gel utilized throughout the procedure. Utilizing continuous ultrasound guidance, the right internal jugular vein was accessed with a 21 gauge needle. 0.018 inch Nitrex guidewire could not be successfully advanced centrally. The right jugular vein was accessed 2 additional times utilizing continuous ultrasound guidance a 21 gauge needle, however the Nitrex wire could not be advanced centrally. Decision was made to place a left-sided central venous catheter. Left neck skin prepped and draped in usual fashion. Ultrasound image documenting patency of the left internal jugular vein was obtained and placed in permanent medical record. Sterile ultrasound probe cover and gel utilized throughout the procedure. Utilizing continuous ultrasound guidance, the left internal jugular vein was  accessed with a 21 gauge needle. 21 gauge needle exchanged for a transitional dilator set over 0.018 inch guidewire. 0.035 inch guidewire advanced through the 5 French dilator to the level of the inferior vena cava. Tract dilation was performed and triple-lumen non tunneled central venous catheter was inserted over the guidewire. The tip was positioned at the cavoatrial junction. All lumens aspirated and flushed well. The catheter was secured to skin with suture and covered with sterile dressing. IMPRESSION: 1. Left IJ non tunneled triple-lumen central venous catheter is ready for use. 2. 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 Electronically Signed   By: Miachel Roux M.D.   On: 08/17/2022 15:16   IR US Guide Vasc Access Left  Result Date: 08/17/2022 INDICATION: 76 year old woman with sepsis and poor venous access presents to IR for central line placement. EXAM: 1. Ultrasound-guided access of right internal jugular vein. 2. Ultrasound-guided access of left internal jugular vein. 3. Left internal jugular vein non tunneled triple-lumen central line placement. MEDICATIONS: None ANESTHESIA/SEDATION: None FLUOROSCOPY: Radiation Exposure Index (as provided by the fluoroscopic device): 84 mGy Kerma COMPLICATIONS: None immediate. PROCEDURE: Informed written consent was obtained from the patient after a thorough discussion of the procedural risks, benefits and alternatives. All questions were addressed. Maximal Sterile Barrier Technique was utilized including caps, mask, sterile gowns, sterile gloves, sterile drape, hand hygiene and skin antiseptic. A timeout was performed prior to the initiation of the procedure. Ultrasound image documenting patency of the right internal jugular vein was obtained and placed in permanent medical record. Sterile ultrasound probe cover and gel utilized throughout the procedure. Utilizing  continuous ultrasound guidance, the right internal jugular vein was accessed with a 21 gauge needle. 0.018 inch Nitrex guidewire could not be successfully advanced centrally. The right jugular vein was accessed 2 additional times utilizing continuous ultrasound guidance a 21 gauge needle, however the Nitrex wire could not be advanced centrally. Decision was made to place a left-sided central venous catheter. Left neck skin prepped and draped in usual fashion. Ultrasound image documenting patency of the left internal jugular vein was obtained and placed in permanent medical record. Sterile ultrasound probe cover and gel utilized throughout the procedure. Utilizing continuous ultrasound guidance, the left internal jugular  vein was accessed with a 21 gauge needle. 21 gauge needle exchanged for a transitional dilator set over 0.018 inch guidewire. 0.035 inch guidewire advanced through the 5 French dilator to the level of the inferior vena cava. Tract dilation was performed and triple-lumen non tunneled central venous catheter was inserted over the guidewire. The tip was positioned at the cavoatrial junction. All lumens aspirated and flushed well. The catheter was secured to skin with suture and covered with sterile dressing. IMPRESSION: 1. Left IJ non tunneled triple-lumen central venous catheter is ready for use. 2. 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 Electronically Signed   By: Miachel Roux M.D.   On: 08/17/2022 15:16   IR US Guide Vasc Access Right  Result Date: 08/17/2022 INDICATION: 76 year old woman with sepsis and poor venous access presents to IR for central line placement. EXAM: 1. Ultrasound-guided access of right internal jugular vein. 2. Ultrasound-guided access of left internal jugular vein. 3. Left internal jugular vein non tunneled triple-lumen central line placement. MEDICATIONS: None  ANESTHESIA/SEDATION: None FLUOROSCOPY: Radiation Exposure Index (as provided by the fluoroscopic device): 84 mGy Kerma COMPLICATIONS: None immediate. PROCEDURE: Informed written consent was obtained from the patient after a thorough discussion of the procedural risks, benefits and alternatives. All questions were addressed. Maximal Sterile Barrier Technique was utilized including caps, mask, sterile gowns, sterile gloves, sterile drape, hand hygiene and skin antiseptic. A timeout was performed prior to the initiation of the procedure. Ultrasound image documenting patency of the right internal jugular vein was obtained and placed in permanent medical record. Sterile ultrasound probe cover and gel utilized throughout the procedure. Utilizing continuous ultrasound guidance, the right internal jugular vein was accessed with a 21 gauge needle. 0.018 inch Nitrex guidewire could not be successfully advanced centrally. The right jugular vein was accessed 2 additional times utilizing continuous ultrasound guidance a 21 gauge needle, however the Nitrex wire could not be advanced centrally. Decision was made to place a left-sided central venous catheter. Left neck skin prepped and draped in usual fashion. Ultrasound image documenting patency of the left internal jugular vein was obtained and placed in permanent medical record. Sterile ultrasound probe cover and gel utilized throughout the procedure. Utilizing continuous ultrasound guidance, the left internal jugular vein was accessed with a 21 gauge needle. 21 gauge needle exchanged for a transitional dilator set over 0.018 inch guidewire. 0.035 inch guidewire advanced through the 5 French dilator to the level of the inferior vena cava. Tract dilation was performed and triple-lumen non tunneled central venous catheter was inserted over the guidewire. The tip was positioned at the cavoatrial junction. All lumens aspirated and flushed well. The catheter was secured to skin with  suture and covered with sterile dressing. IMPRESSION: 1. Left IJ non tunneled triple-lumen central venous catheter is ready for use. 2. 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 Electronically Signed   By: Miachel Roux M.D.   On: 08/17/2022 15:16   [place] EKG SITE RITE  Result Date: 08/17/2022 If Site Rite image not attached, placement could not be confirmed due to current cardiac rhythm.  DG Chest Portable 1 View  Result Date: 08/17/2022 CLINICAL DATA:  76 year old female status post fall last night. Leukocytosis. EXAM: PORTABLE CHEST 1 VIEW COMPARISON:  CT Chest, Abdomen, and Pelvis today are reported separately. 07/27/2022. FINDINGS: Portable  AP upright view at 0541 hours. Lung volumes and mediastinal contours are within normal limits. Allowing for portable technique the lungs are clear. No pneumothorax or pleural effusion identified. No acute osseous abnormality identified. Left mastectomy. Negative visible bowel gas. Visualized tracheal air column is within normal limits. IMPRESSION: No acute cardiopulmonary abnormality or acute traumatic injury identified. Electronically Signed   By: Genevie Ann M.D.   On: 08/17/2022 06:07   CT Head Wo Contrast  Result Date: 08/17/2022 CLINICAL DATA:  Fall EXAM: CT HEAD WITHOUT CONTRAST CT CERVICAL SPINE WITHOUT CONTRAST TECHNIQUE: Multidetector CT imaging of the head and cervical spine was performed following the standard protocol without intravenous contrast. Multiplanar CT image reconstructions of the cervical spine were also generated. RADIATION DOSE REDUCTION: This exam was performed according to the departmental dose-optimization program which includes automated exposure control, adjustment of the mA and/or kV according to patient size and/or use of iterative reconstruction technique. COMPARISON:  CT head dated 07/26/2022. CT head/cervical spine dated 06/28/2022.  FINDINGS: CT HEAD FINDINGS Brain: No evidence of acute infarction, hemorrhage, hydrocephalus, extra-axial collection or mass lesion/mass effect. Subcortical white matter and periventricular small vessel ischemic changes. Vascular: Mild intracranial atherosclerosis. Skull: Normal. Negative for fracture or focal lesion. Sinuses/Orbits: The visualized paranasal sinuses are essentially clear. The mastoid air cells are unopacified. Other: None. CT CERVICAL SPINE FINDINGS Alignment: Normal cervical lordosis. Skull base and vertebrae: No acute fracture. No primary bone lesion or focal pathologic process. Soft tissues and spinal canal: No prevertebral fluid or swelling. No visible canal hematoma. Disc levels: Moderate degenerative changes of the mid cervical spine. Spinal canal is patent. Upper chest: Visualized lung apices are clear. Other: Visualized thyroid is unremarkable. IMPRESSION: No evidence of acute intracranial abnormality. Small vessel ischemic changes. No evidence of traumatic injury to the cervical spine. Moderate degenerative changes. Electronically Signed   By: Julian Hy M.D.   On: 08/17/2022 03:22   CT Cervical Spine Wo Contrast  Result Date: 08/17/2022 CLINICAL DATA:  Fall EXAM: CT HEAD WITHOUT CONTRAST CT CERVICAL SPINE WITHOUT CONTRAST TECHNIQUE: Multidetector CT imaging of the head and cervical spine was performed following the standard protocol without intravenous contrast. Multiplanar CT image reconstructions of the cervical spine were also generated. RADIATION DOSE REDUCTION: This exam was performed according to the departmental dose-optimization program which includes automated exposure control, adjustment of the mA and/or kV according to patient size and/or use of iterative reconstruction technique. COMPARISON:  CT head dated 07/26/2022. CT head/cervical spine dated 06/28/2022. FINDINGS: CT HEAD FINDINGS Brain: No evidence of acute infarction, hemorrhage, hydrocephalus, extra-axial  collection or mass lesion/mass effect. Subcortical white matter and periventricular small vessel ischemic changes. Vascular: Mild intracranial atherosclerosis. Skull: Normal. Negative for fracture or focal lesion. Sinuses/Orbits: The visualized paranasal sinuses are essentially clear. The mastoid air cells are unopacified. Other: None. CT CERVICAL SPINE FINDINGS Alignment: Normal cervical lordosis. Skull base and vertebrae: No acute fracture. No primary bone lesion or focal pathologic process. Soft tissues and spinal canal: No prevertebral fluid or swelling. No visible canal hematoma. Disc levels: Moderate degenerative changes of the mid cervical spine. Spinal canal is patent. Upper chest: Visualized lung apices are clear. Other: Visualized thyroid is unremarkable. IMPRESSION: No evidence of acute intracranial abnormality. Small vessel ischemic changes. No evidence of traumatic injury to the cervical spine. Moderate degenerative changes. Electronically Signed   By: Julian Hy M.D.   On: 08/17/2022 03:22   DG Knee Complete 4 Views Left  Result Date: 08/17/2022 CLINICAL DATA:  Left knee  pain/injury EXAM: LEFT KNEE - COMPLETE 4+ VIEW COMPARISON:  None Available. FINDINGS: No fracture or dislocation is seen. Mild tricompartmental degenerative changes, most prominent in the patellofemoral compartment. Lateral compartment chondrocalcinosis. No suprapatellar knee joint effusion. IMPRESSION: No fracture or dislocation is seen. Mild degenerative changes with chondrocalcinosis. Electronically Signed   By: Julian Hy M.D.   On: 08/17/2022 02:42   DG Abd Portable 1V  Result Date: 07/29/2022 CLINICAL DATA:  Constipation EXAM: PORTABLE ABDOMEN - 1 VIEW COMPARISON:  10/09/2021 FINDINGS: Bowel gas pattern is nonspecific. Small amount of stool is seen in colon. There is no fecal impaction in rectum. Surgical clips are seen in right upper quadrant. No abnormal masses or calcifications are seen. Kidneys are  partly obscured by bowel contents. Degenerative changes are noted in the lumbar spine. IMPRESSION: Nonspecific bowel gas pattern. Electronically Signed   By: Elmer Picker M.D.   On: 07/29/2022 09:15   CT Angio Chest/Abd/Pel for Dissection W and/or W/WO  Result Date: 07/27/2022 CLINICAL DATA:  76 year old female with history of chest and back pain. Suspected aortic dissection. EXAM: CT ANGIOGRAPHY CHEST, ABDOMEN AND PELVIS TECHNIQUE: Non-contrast CT of the chest was initially obtained. Multidetector CT imaging through the chest, abdomen and pelvis was performed using the standard protocol during bolus administration of intravenous contrast. Multiplanar reconstructed images and MIPs were obtained and reviewed to evaluate the vascular anatomy. RADIATION DOSE REDUCTION: This exam was performed according to the departmental dose-optimization program which includes automated exposure control, adjustment of the mA and/or kV according to patient size and/or use of iterative reconstruction technique. CONTRAST:  52m OMNIPAQUE IOHEXOL 350 MG/ML SOLN COMPARISON:  CT the abdomen and pelvis 06/18/2022. Chest CTA 12/01/2021. FINDINGS: CTA CHEST FINDINGS Cardiovascular: Precontrast images demonstrate no crescentic high attenuation associated with the wall of the thoracic aorta to suggest acute intramural hemorrhage. Postcontrast images demonstrate no evidence of thoracic aortic aneurysm or dissection. Thoracic aorta is normal in caliber measuring 3.1 cm, 2.6 cm and 2.4 cm in diameter in the ascending, mid arch and descending thoracic aorta respectively. There is aortic atherosclerosis, as well as atherosclerosis of the great vessels of the mediastinum and the coronary arteries, including calcified atherosclerotic plaque in the left anterior descending coronary artery. Heart size is normal. There is no significant pericardial fluid, thickening or pericardial calcification. Mediastinum/Nodes: No pathologically enlarged  mediastinal or hilar lymph nodes. Small hiatal hernia. Esophagus is otherwise unremarkable in appearance. No axillary lymphadenopathy. Lungs/Pleura: New area of ground-glass attenuation in the central left upper lobe best appreciated on axial image 70 of series 9, not evident on prior exam from 12/01/2021, presumably of infectious or inflammatory etiology. Right lung is clear. No pleural effusions. No definite suspicious appearing pulmonary nodules or masses are noted. Musculoskeletal: Status post left modified radical mastectomy. There are no aggressive appearing lytic or blastic lesions noted in the visualized portions of the skeleton. Review of the MIP images confirms the above findings. CTA ABDOMEN AND PELVIS FINDINGS VASCULAR Aorta: Normal caliber aorta without aneurysm, dissection, vasculitis or significant stenosis. Celiac: Patent without evidence of aneurysm, dissection, vasculitis or significant stenosis. SMA: Patent without evidence of aneurysm, dissection, vasculitis or significant stenosis. Renals: Both renal arteries are patent without evidence of aneurysm, dissection, vasculitis, fibromuscular dysplasia or significant stenosis. IMA: Patent without evidence of aneurysm, dissection, vasculitis or significant stenosis. Inflow: Patent without evidence of aneurysm, dissection, vasculitis or significant stenosis. Veins: None. Review of the MIP images confirms the above findings. NON-VASCULAR Hepatobiliary: No suspicious cystic or solid hepatic lesions.  No intra or extrahepatic biliary ductal dilatation. Status post cholecystectomy. Pancreas: No pancreatic mass. No pancreatic ductal dilatation. No pancreatic or peripancreatic fluid collections or inflammatory changes. Spleen: Unremarkable. Adrenals/Urinary Tract: Bilateral kidneys and adrenal glands are normal in appearance. No hydroureteronephrosis. Urinary bladder is mildly distended, but otherwise unremarkable in appearance. Stomach/Bowel: Intra-abdominal  portion of the stomach is unremarkable. No pathologic dilatation of small bowel or colon. Numerous colonic diverticulae are noted, particularly in the descending colon, without surrounding inflammatory changes to indicate an acute diverticulitis at this time. The appendix is not confidently identified and may be surgically absent. Regardless, there are no inflammatory changes noted adjacent to the cecum to suggest the presence of an acute appendicitis at this time. Lymphatic: No lymphadenopathy noted in the abdomen or pelvis. Reproductive: Status post hysterectomy. Ovaries are not confidently identified may be surgically absent or atrophic. Other: No significant volume of ascites.  No pneumoperitoneum. Musculoskeletal: There are no aggressive appearing lytic or blastic lesions noted in the visualized portions of the skeleton. Review of the MIP images confirms the above findings. IMPRESSION: 1. No acute abnormality of the thoracoabdominal aorta. Specifically, no evidence of acute aortic syndrome. 2. Focus of ground-glass attenuation in the central aspect of the left upper lobe, new compared to the prior examination, presumably of infectious or inflammatory etiology. Clinical correlation for signs and symptoms of developing bronchopneumonia is recommended. 3. Severe colonic diverticulosis without evidence of acute diverticulitis at this time. 4. Aortic atherosclerosis, in addition to left anterior descending coronary artery disease. Assessment for potential risk factor modification, dietary therapy or pharmacologic therapy may be warranted, if clinically indicated. 5. Additional incidental findings, as above. Electronically Signed   By: Vinnie Langton M.D.   On: 07/27/2022 05:26   DG Chest Portable 1 View  Result Date: 07/27/2022 CLINICAL DATA:  Chest pain. EXAM: PORTABLE CHEST 1 VIEW COMPARISON:  Chest CT dated 12/01/2021. FINDINGS: No focal consolidation, pleural effusion, pneumothorax. Mild cardiomegaly. No  acute osseous pathology. IMPRESSION: 1. No acute cardiopulmonary process. 2. Mild cardiomegaly. Electronically Signed   By: Anner Crete M.D.   On: 07/27/2022 01:30   CT Head Wo Contrast  Result Date: 07/26/2022 CLINICAL DATA:  Headache, new or worsening (Age >= 50y) EXAM: CT HEAD WITHOUT CONTRAST TECHNIQUE: Contiguous axial images were obtained from the base of the skull through the vertex without intravenous contrast. RADIATION DOSE REDUCTION: This exam was performed according to the departmental dose-optimization program which includes automated exposure control, adjustment of the mA and/or kV according to patient size and/or use of iterative reconstruction technique. COMPARISON:  06/28/2022 FINDINGS: Brain: No evidence of acute infarction, hemorrhage, hydrocephalus, extra-axial collection or mass lesion/mass effect. 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 Patchy low-density changes within the periventricular and subcortical white matter compatible with chronic microvascular ischemic change. Mild diffuse cerebral volume loss. Vascular: Atherosclerotic calcifications involving the large vessels of the skull base. No unexpected hyperdense vessel. Skull: Normal. Negative for fracture or focal lesion. Sinuses/Orbits: No acute finding. Other: None. IMPRESSION: 1. No acute intracranial abnormality. 2. Chronic microvascular ischemic change and cerebral volume loss. Electronically Signed   By: Davina Poke D.O.   On: 07/26/2022 18:11     The results of significant diagnostics from this hospitalization (including imaging, microbiology, ancillary and laboratory) are listed below for reference.     Microbiology: Recent Results (from the past 240 hour(s))  Urine Culture     Status: Abnormal   Collection Time: 08/17/22  2:46 AM   Specimen: Urine, Random  Result Value Ref Range Status   Specimen Description   Final    URINE, RANDOM Performed at [facility], [address]  [place], [place] [address]    Special Requests   Final    NONE Performed at [facility], [address] [place], [place] [address]    Culture MULTIPLE SPECIES PRESENT, SUGGEST RECOLLECTION (A)  Final   Report Status 08/18/2022 FINAL  Final  SARS Coronavirus 2 by RT PCR (hospital order, performed in [facility] hospital lab) *cepheid single result test* Anterior Nasal Swab     Status: None   Collection Time: 08/17/22  5:47 AM   Specimen: Anterior Nasal Swab  Result Value Ref Range Status   SARS Coronavirus 2 by RT PCR NEGATIVE NEGATIVE Final    Comment: (NOTE) SARS-CoV-2 target nucleic acids are NOT DETECTED.  The SARS-CoV-2 RNA is generally detectable in upper and lower respiratory specimens during the acute phase of infection. The lowest concentration of SARS-CoV-2 viral copies this assay can detect is 250 copies / mL. A negative result does not preclude SARS-CoV-2 infection and should not be used as the sole basis for treatment or other patient management decisions.  A negative result may occur with improper specimen collection / handling, submission of specimen other than nasopharyngeal swab, presence of viral mutation(s) within the areas targeted by this assay, and inadequate number of viral copies (<250 copies / mL). A negative result must be combined with clinical observations, patient history, and epidemiological information.  Fact Sheet for Patients:   https://www.patel.info/  Fact Sheet for Healthcare Providers: https://hall.com/  This test is not yet approved or  cleared by the [place] FDA and has been authorized for detection and/or diagnosis of SARS-CoV-2 by FDA under an Emergency Use Authorization (EUA).  This EUA will remain in effect (meaning this test can be used) for the duration of the COVID-19 declaration under Section 564(b)(1) of the Act, 21 U.S.C. section 360bbb-3(b)(1), unless the authorization is  terminated or revoked sooner.  Performed at [facility], [address] [place], [place] [address]   Blood culture (routine x 2)     Status: None   Collection Time: 08/17/22  6:13 AM   Specimen: BLOOD RIGHT HAND  Result Value Ref Range Status   Specimen Description BLOOD RIGHT HAND  Final   Special Requests   Final    BOTTLES DRAWN AEROBIC AND ANAEROBIC Blood Culture adequate volume   Culture   Final    NO GROWTH 5 DAYS Performed at [facility], [address], [place], [place] [address]    Report Status 08/22/2022 FINAL  Final  Blood culture (routine x 2)     Status: None   Collection Time: 08/17/22  6:13 AM   Specimen: BLOOD RIGHT ARM  Result Value Ref Range Status   Specimen Description BLOOD RIGHT ARM  Final   Special Requests   Final    BOTTLES DRAWN AEROBIC AND ANAEROBIC Blood Culture results may not be optimal due to an inadequate volume of blood received in culture bottles   Culture   Final    NO GROWTH 5 DAYS Performed at [facility], [address], [place], [place] [address]    Report Status 08/22/2022 FINAL  Final     Labs: BNP (last 3 results) Recent Labs    06/18/22 1501 08/17/22 0403 08/20/22 0530  BNP 70.5 33.0 46.2   Basic Metabolic Panel: Recent Labs  Lab 08/18/22 0530 08/18/22 1544 08/20/22  0021 08/20/22 0530 08/20/22 0821 08/21/22 0500 08/22/22 0552 08/23/22 0723 08/24/22 0630  NA 129*   < > 129*  --  131*  --  134* 134* 135  K 3.2*   < > 3.7  --  4.2  --  4.0 4.1 3.3*  CL 98   < > 100  --  100  --  104 101 101  CO2 23   < > 22  --  27  --  '25 27 27  '$ GLUCOSE 121*   < > 109*  --  96  --  94 105* 107*  BUN 25*   < > 13  --  16  --  '11 13 13  '$ CREATININE 1.29*   < > 1.02*  --  1.11*  --  0.79 0.91 0.85  CALCIUM 8.4*   < > 8.5*  --  8.7*  --  8.6* 8.8* 8.6*  MG  --    < >  --  1.7  --  1.5* 1.4* 1.7 1.6*  PHOS 3.3  --   --   --   --   --   --   --   --    < > = values in this interval not displayed.    Liver Function Tests: Recent Labs  Lab 08/19/22 0030  AST 17  ALT 12  ALKPHOS 41  BILITOT 0.7  PROT 6.2*  ALBUMIN 3.0*   No results for input(s): "LIPASE", "AMYLASE" in the last 168 hours. No results for input(s): "AMMONIA" in the last 168 hours. CBC: Recent Labs  Lab 08/20/22 0530 08/21/22 0500 08/22/22 0552 08/23/22 0723 08/24/22 0630  WBC 10.5 7.8 7.8 8.1 10.3  HGB 8.7* 8.7* 8.7* 9.0* 9.8*  HCT 26.3* 26.2* 25.2* 26.9* 29.0*  MCV 90.7 91.3 90.3 90.3 89.0  PLT 214 221 225 241 272   Cardiac Enzymes: No results for input(s): "CKTOTAL", "CKMB", "CKMBINDEX", "TROPONINI" in the last 168 hours. BNP: Invalid input(s): "POCBNP" CBG: No results for input(s): "GLUCAP" in the last 168 hours. D-Dimer No results for input(s): "DDIMER" in the last 72 hours. Hgb A1c No results for input(s): "HGBA1C" in the last 72 hours. Lipid Profile No results for input(s): "CHOL", "HDL", "LDLCALC", "TRIG", "CHOLHDL", "LDLDIRECT" in the last 72 hours. Thyroid function studies No results for input(s): "TSH", "T4TOTAL", "T3FREE", "THYROIDAB" in the last 72 hours.  Invalid input(s): "FREET3" Anemia work up No results for input(s): "VITAMINB12", "FOLATE", "FERRITIN", "TIBC", "IRON", "RETICCTPCT" in the last 72 hours. Urinalysis    Component Value Date/Time   COLORURINE STRAW (A) 08/23/2022 1843   APPEARANCEUR CLEAR (A) 08/23/2022 1843   LABSPEC 1.010 08/23/2022 1843   PHURINE 7.0 08/23/2022 1843   GLUCOSEU NEGATIVE 08/23/2022 1843   HGBUR NEGATIVE 08/23/2022 1843   BILIRUBINUR NEGATIVE 08/23/2022 1843   KETONESUR NEGATIVE 08/23/2022 1843   PROTEINUR NEGATIVE 08/23/2022 1843   NITRITE NEGATIVE 08/23/2022 1843   LEUKOCYTESUR NEGATIVE 08/23/2022 1843   Sepsis Labs Recent Labs  Lab 08/21/22 0500 08/22/22 0552 08/23/22 0723 08/24/22 0630  WBC 7.8 7.8 8.1 10.3   Microbiology Recent Results (from the past 240 hour(s))  Urine Culture     Status: Abnormal   Collection Time: 08/17/22   2:46 AM   Specimen: Urine, Random  Result Value Ref Range Status   Specimen Description   Final    URINE, RANDOM Performed at [facility], [address] [place], [place] [address]    Special Requests   Final    NONE  Performed at [facility], [address] [place], [place] [address]    Culture MULTIPLE SPECIES PRESENT, SUGGEST RECOLLECTION (A)  Final   Report Status 08/18/2022 FINAL  Final  SARS Coronavirus 2 by RT PCR (hospital order, performed in [facility] hospital lab) *cepheid single result test* Anterior Nasal Swab     Status: None   Collection Time: 08/17/22  5:47 AM   Specimen: Anterior Nasal Swab  Result Value Ref Range Status   SARS Coronavirus 2 by RT PCR NEGATIVE NEGATIVE Final    Comment: (NOTE) SARS-CoV-2 target nucleic acids are NOT DETECTED.  The SARS-CoV-2 RNA is generally detectable in upper and lower respiratory specimens during the acute phase of infection. The lowest concentration of SARS-CoV-2 viral copies this assay can detect is 250 copies / mL. A negative result does not preclude SARS-CoV-2 infection and should not be used as the sole basis for treatment or other patient management decisions.  A negative result may occur with improper specimen collection / handling, submission of specimen other than nasopharyngeal swab, presence of viral mutation(s) within the areas targeted by this assay, and inadequate number of viral copies (<250 copies / mL). A negative result must be combined with clinical observations, patient history, and epidemiological information.  Fact Sheet for Patients:   https://www.patel.info/  Fact Sheet for Healthcare Providers: https://hall.com/  This test is not yet approved or  cleared by the [place] FDA and has been authorized for detection and/or diagnosis of SARS-CoV-2 by FDA under an Emergency Use Authorization (EUA).  This EUA will remain in effect  (meaning this test can be used) for the duration of the COVID-19 declaration under Section 564(b)(1) of the Act, 21 U.S.C. section 360bbb-3(b)(1), unless the authorization is terminated or revoked sooner.  Performed at [facility], [address] [place], [place] [address]   Blood culture (routine x 2)     Status: None   Collection Time: 08/17/22  6:13 AM   Specimen: BLOOD RIGHT HAND  Result Value Ref Range Status   Specimen Description BLOOD RIGHT HAND  Final   Special Requests   Final    BOTTLES DRAWN AEROBIC AND ANAEROBIC Blood Culture adequate volume   Culture   Final    NO GROWTH 5 DAYS Performed at [facility], [address] [place], [place] [address]    Report Status 08/22/2022 FINAL  Final  Blood culture (routine x 2)     Status: None   Collection Time: 08/17/22  6:13 AM   Specimen: BLOOD RIGHT ARM  Result Value Ref Range Status   Specimen Description BLOOD RIGHT ARM  Final   Special Requests   Final    BOTTLES DRAWN AEROBIC AND ANAEROBIC Blood Culture results may not be optimal due to an inadequate volume of blood received in culture bottles   Culture   Final    NO GROWTH 5 DAYS Performed at [facility], [address], [place], [place] [address]    Report Status 08/22/2022 FINAL  Final     Time coordinating discharge:  I have spent 35 minutes face to face with the patient and on the ward discussing the patients care, assessment, plan and disposition with other care givers. >50% of the time was devoted counseling the patient about the risks and benefits of treatment/Discharge disposition and coordinating care.   SIGNED:   Damita Lack, MD  [facility] 08/24/2022, 11:37 AM   If 7PM-7AM, please contact night-coverage

## 2022-08-24 NOTE — Care Management Important Message (Signed)
Important Message  Patient Details  Name: Cindy Huerta MRN: [id number] Date of Birth: 12-21-1945   Medicare Important Message Given:  Yes     Juliann Pulse A Amori Cooperman 08/24/2022, 2:39 PM

## 2022-08-24 NOTE — Progress Notes (Signed)
Physical Therapy Treatment Patient Details Name: Cindy Huerta MRN: [id number] DOB: 1946-07-18 Today's Date: 08/24/2022   History of Present Illness Pt is a 76 year old female presenting following fall and dysruia. PMH significant for HTN, hyperlipidemia, TIA, GERD, hypertension, depression with anxiety, CKD-3b, GI bleeding, dCHF, hyponatremia. Further workup showed "nondisplaced subchondral fracture of the  posterolateral tibial plateau, Intrasubstance degeneration of the medial meniscus with probable  nondisplaced degenerative tear of the meniscal body."    PT Comments    Pt received upright in bed agreeable to PT/OT co-treat for pt and therapist safety. Pt did require minA for bed mobility today due to being in supine on opposite side of bed but able to negotiate LE's and UE mostly independently with bed features, just requiring minA on chuck pad for LE's to reach floor. X2 STS performed from lowered surface with modA+2 needed with VC's for hand placement. Pt tolerating ~15 sec for first rep requiring seated rest due to fatigue. On second attempt pt able to perform step pivot with RW and increased time and minA on RW for turning and VC's for sequencing limbs prior to sitting. Once sitting KI doffed with L knee PROM performed to tolerable ranges. KI re-donned with LE's elevated to assist in edema management with all needs in reach. D/c recs remain appropriate as pt unable to tolerate gait.     Recommendations for follow up therapy are one component of a multi-disciplinary discharge planning process, led by the attending physician.  Recommendations may be updated based on patient status, additional functional criteria and insurance authorization.  Follow Up Recommendations  Skilled nursing-short term rehab (<3 hours/day) Can patient physically be transported by private vehicle: No   Assistance Recommended at Discharge Frequent or constant Supervision/Assistance  Patient can return home with the  following Two people to help with walking and/or transfers;Two people to help with bathing/dressing/bathroom;Direct supervision/assist for medications management;Help with stairs or ramp for entrance;Assist for transportation;Assistance with cooking/housework;Direct supervision/assist for financial management   Equipment Recommendations  Other (comment) (tbd by next venue of care)    Recommendations for Other Services       Precautions / Restrictions Precautions Precautions: Fall Required Braces or Orthoses: Knee Immobilizer - Left Restrictions Weight Bearing Restrictions: Yes LLE Weight Bearing: Weight bearing as tolerated Other Position/Activity Restrictions: "Ice and gentle passive range of motion as tolerated." Per Dr. Sabra Heck     Mobility  Bed Mobility Overal bed mobility: Needs Assistance Bed Mobility: Supine to Sit     Supine to sit: Min assist, HOB elevated     General bed mobility comments: minA this date as pt supine on other side of bed. Patient Response: Cooperative, Flat affect  Transfers Overall transfer level: Needs assistance Equipment used: Rolling walker (2 wheels) Transfers: Sit to/from Stand, Bed to chair/wheelchair/BSC Sit to Stand: +2 safety/equipment, Mod assist   Step pivot transfers: Min guard, +2 safety/equipment       General transfer comment: modA +2 from lowered surface this date. MinA at [facility] for sequencing. Limited standing tolerance thus deferring gait this date.    Ambulation/Gait               General Gait Details: Limited standing tolerance thus deferring gait this date.   Stairs             Wheelchair Mobility    Modified Rankin (Stroke Patients Only)       Balance Overall balance assessment: Needs assistance Sitting-balance support: Feet supported, Bilateral upper extremity supported  Sitting balance-Leahy Scale: Fair     Standing balance support: Reliant on assistive device for balance Standing balance-Leahy  Scale: Poor                              Cognition Arousal/Alertness: Awake/alert Behavior During Therapy: WFL for tasks assessed/performed, Flat affect Overall Cognitive Status: Difficult to assess                                          Exercises General Exercises - Lower Extremity Ankle Circles/Pumps: AROM, Strengthening, Both, 10 reps, Supine Hip ABduction/ADduction: AROM, Supine, Strengthening, Both, 10 reps Other Exercises Other Exercises: x10 PROM L knee flexion/extension    General Comments        Pertinent Vitals/Pain Pain Assessment Pain Assessment: Faces Faces Pain Scale: Hurts a little bit Pain Location: LLE with movement Pain Descriptors / Indicators: Grimacing Pain Intervention(s): Limited activity within patient's tolerance, Monitored during session, Repositioned    Home Living                          Prior Function            PT Goals (current goals can now be found in the care plan section) Acute Rehab PT Goals Patient Stated Goal: get better PT Goal Formulation: With patient Time For Goal Achievement: 09/01/22 Potential to Achieve Goals: Good Progress towards PT goals: Progressing toward goals    Frequency    Min 2X/week      PT Plan Current plan remains appropriate    Co-evaluation PT/OT/SLP Co-Evaluation/Treatment: Yes Reason for Co-Treatment: Complexity of the patient's impairments (multi-system involvement);Necessary to address cognition/behavior during functional activity;For patient/therapist safety;To address functional/ADL transfers PT goals addressed during session: Mobility/safety with mobility;Balance;Proper use of DME;Strengthening/ROM OT goals addressed during session: ADL's and self-care      AM-PAC PT "6 Clicks" Mobility   Outcome Measure  Help needed turning from your back to your side while in a flat bed without using bedrails?: A Lot Help needed moving from lying on your back  to sitting on the side of a flat bed without using bedrails?: A Lot Help needed moving to and from a bed to a chair (including a wheelchair)?: A Lot Help needed standing up from a chair using your arms (e.g., wheelchair or bedside chair)?: A Lot Help needed to walk in hospital room?: Total Help needed climbing 3-5 steps with a railing? : Total 6 Click Score: 10    End of Session Equipment Utilized During Treatment: Gait belt Activity Tolerance: Patient tolerated treatment well Patient left: in chair;with call bell/phone within reach;with chair alarm set Nurse Communication: Mobility status PT Visit Diagnosis: Unsteadiness on feet (R26.81);Difficulty in walking, not elsewhere classified (R26.2)     Time: 1137-1209 PT Time Calculation (min) (ACUTE ONLY): 32 min  Charges:  $Therapeutic Exercise: 8-22 mins                    Salem Caster. Fairly IV, PT, DPT Physical Therapist- [facility]  08/24/2022, 12:48 PM

## 2022-08-24 NOTE — Progress Notes (Signed)
Pt discharging to facility, report called to [facility] LPN, EMS to transport, pt with no complaints

## 2022-08-24 NOTE — Progress Notes (Signed)
Occupational Therapy Treatment Patient Details Name: Cindy Huerta MRN: [id number] DOB: August 01, 1946 Today's Date: 08/24/2022   History of present illness Pt is a 76 year old female presenting following fall and dysruia. PMH significant for HTN, hyperlipidemia, TIA, GERD, hypertension, depression with anxiety, CKD-3b, GI bleeding, dCHF, hyponatremia. Further workup showed "nondisplaced subchondral fracture of the  posterolateral tibial plateau, Intrasubstance degeneration of the medial meniscus with probable  nondisplaced degenerative tear of the meniscal body."   OT comments  Upon entering session, pt resting in bed and agreeable to OT/PT co-treatment to maximize safety and participation. Pt required Min A for supine to sit, Mod A +2 to stand from EOB, and Min guard +2 for step pivot to recliner. Pt completed grooming task while sitting in recliner with set up A. Pt required step by step verbal cues for sequencing and safe transfer techniques during mobility this date. Pt with limited standing tolerance and relied heavily on RW for balance. Pt left in recliner with all needs in reach. Pt is making progress toward goal completion. D/C recommendation remains appropriate. OT will continue to follow acutely.    Recommendations for follow up therapy are one component of a multi-disciplinary discharge planning process, led by the attending physician.  Recommendations may be updated based on patient status, additional functional criteria and insurance authorization.    Follow Up Recommendations  Skilled nursing-short term rehab (<3 hours/day)    Assistance Recommended at Discharge Frequent or constant Supervision/Assistance  Patient can return home with the following  A lot of help with walking and/or transfers;A lot of help with bathing/dressing/bathroom   Equipment Recommendations  Other (comment) (defer to next venue of care)    Recommendations for Other Services      Precautions / Restrictions  Precautions Precautions: Fall Required Braces or Orthoses: Knee Immobilizer - Left Knee Immobilizer - Left: Other (comment) (required for weightbearing, Can remove for PROM) Restrictions Weight Bearing Restrictions: Yes LLE Weight Bearing: Weight bearing as tolerated Other Position/Activity Restrictions: "Ice and gentle passive range of motion as tolerated." Per Dr. Sabra Heck       Mobility Bed Mobility Overal bed mobility: Needs Assistance Bed Mobility: Supine to Sit     Supine to sit: Min assist, HOB elevated     General bed mobility comments: Min A for supine to sit and to scoot hips toward EOB    Transfers Overall transfer level: Needs assistance Equipment used: Rolling walker (2 wheels) Transfers: Sit to/from Stand, Bed to chair/wheelchair/BSC Sit to Stand: +2 safety/equipment, Mod assist     Step pivot transfers: Min guard, +2 safety/equipment     General transfer comment: Mod A +2 to stand from EOB, Min A for RW management, verbal cues for anterior weight shifting     Balance Overall balance assessment: Needs assistance Sitting-balance support: Feet supported, Bilateral upper extremity supported Sitting balance-Leahy Scale: Fair     Standing balance support: Reliant on assistive device for balance Standing balance-Leahy Scale: Poor Standing balance comment: limited standing tolerance                           ADL either performed or assessed with clinical judgement   ADL Overall ADL's : Needs assistance/impaired     Grooming: Wash/dry face;Sitting;Cueing for sequencing;Set up               Lower Body Dressing: Maximal assistance;Sitting/lateral leans   Toilet Transfer: +2 for safety/equipment;Rolling walker (2 wheels);Min guard Toilet Transfer Details (  indicate cue type and reason): simulated with step pivot t/f from EOB > recliner Toileting- Clothing Manipulation and Hygiene: Maximal assistance;Sit to/from stand               Extremity/Trunk Assessment Upper Extremity Assessment Upper Extremity Assessment: Generalized weakness   Lower Extremity Assessment Lower Extremity Assessment: Generalized weakness   Cervical / Trunk Assessment Cervical / Trunk Assessment: Normal    Vision Patient Visual Report: No change from baseline Vision Assessment?: No apparent visual deficits   Perception     Praxis      Cognition Arousal/Alertness: Awake/alert Behavior During Therapy: WFL for tasks assessed/performed, Flat affect Overall Cognitive Status: Difficult to assess Area of Impairment: Attention, Following commands, Safety/judgement, Awareness, Problem solving, Orientation, Memory                 Orientation Level: Disoriented to, Situation Current Attention Level: Sustained Memory: Decreased short-term memory Following Commands: Follows one step commands with increased time Safety/Judgement: Decreased awareness of deficits, Decreased awareness of safety Awareness: Emergent Problem Solving: Slow processing, Decreased initiation, Difficulty sequencing, Requires verbal cues, Requires tactile cues          Exercises      Shoulder Instructions       General Comments      Pertinent Vitals/ Pain       Pain Assessment Pain Assessment: Faces Faces Pain Scale: Hurts a little bit Pain Location: LLE with movement Pain Descriptors / Indicators: Grimacing, Discomfort Pain Intervention(s): Limited activity within patient's tolerance, Monitored during session, Repositioned  Home Living                                          Prior Functioning/Environment              Frequency  Min 2X/week        Progress Toward Goals  OT Goals(current goals can now be found in the care plan section)  Progress towards OT goals: Progressing toward goals  Acute Rehab OT Goals OT Goal Formulation: With patient Time For Goal Achievement: 09/02/22 Potential to Achieve Goals: Good   Plan Discharge plan remains appropriate;Frequency remains appropriate    Co-evaluation    PT/OT/SLP Co-Evaluation/Treatment: Yes Reason for Co-Treatment: Complexity of the patient's impairments (multi-system involvement);Necessary to address cognition/behavior during functional activity;For patient/therapist safety;To address functional/ADL transfers PT goals addressed during session: Mobility/safety with mobility;Balance;Proper use of DME;Strengthening/ROM OT goals addressed during session: ADL's and self-care      AM-PAC OT "6 Clicks" Daily Activity     Outcome Measure   Help from another person eating meals?: A Little Help from another person taking care of personal grooming?: A Little Help from another person toileting, which includes using toliet, bedpan, or urinal?: A Lot Help from another person bathing (including washing, rinsing, drying)?: A Little Help from another person to put on and taking off regular upper body clothing?: A Little Help from another person to put on and taking off regular lower body clothing?: A Lot 6 Click Score: 16    End of Session Equipment Utilized During Treatment: Rolling walker (2 wheels);Left knee immobilizer;Gait belt  OT Visit Diagnosis: Unsteadiness on feet (R26.81);Other abnormalities of gait and mobility (R26.89);Muscle weakness (generalized) (M62.81)   Activity Tolerance Patient tolerated treatment well   Patient Left with call bell/phone within reach;in chair;with chair alarm set   Nurse Communication Mobility status  Time: 4196-2229 OT Time Calculation (min): 33 min  Charges: OT General Charges $OT Visit: 1 Visit OT Treatments $Self Care/Home Management : 8-22 mins   [facility] MS, OTR/L ascom [phone]  08/24/22, 1:16 PM

## 2022-08-24 NOTE — TOC Progression Note (Addendum)
Transition of Care [facility]) - Progression Note    Patient Details  Name: Cindy Huerta MRN: [id number] Date of Birth: 08-06-1946  [facility] of [facility]) CM/SW Contact  Laurena Slimmer, RN Phone Number: 08/24/2022, 11:26 AM  Clinical Narrative:    Per [place] portal patient has been approved for [facility] SNF. MD notified.   Message left for Admissions Coordinator, Vilma Meckel to confirm patient will be accepted today.  12:00am Brittney returned call. She made inquiry about patient's anxiety. Spoke with Camera operator. Patient's anxiety more controlled. Brittney reported having discharge summary.   12:10pm Patient's son notified patient would be discharging to [facility].  1:00pm Nurse provided with room number 401 and to call report to [phone]. EMS packet arranged . MD notfied DNR required a signature  3:10pm DNR signed. EMS arranged. TOC signing off.           Expected Discharge Plan and Services                                                 Social Determinants of Health (SDOH) Interventions    Readmission Risk Interventions    06/20/2022    1:01 PM 11/25/2021    1:15 PM 10/10/2021    1:24 PM  Readmission Risk Prevention Plan  Transportation Screening  Complete Complete  PCP or Specialist Appt within 5-7 Days   Complete  PCP or Specialist Appt within 3-5 Days Complete Complete   Home Care Screening   Complete  Medication Review (RN CM)   Complete  HRI or Home Care Consult  Complete   Social Work Consult for [place] Planning/Counseling Complete Complete   Palliative Care Screening Not Applicable Not Applicable   Medication Review [employer])  Complete

## 2022-11-11 ENCOUNTER — Other Ambulatory Visit: Payer: Self-pay | Admitting: Surgery

## 2022-11-11 DIAGNOSIS — Z853 Personal history of malignant neoplasm of breast: Secondary | ICD-10-CM

## 2022-12-10 ENCOUNTER — Ambulatory Visit
Admission: RE | Admit: 2022-12-10 | Discharge: 2022-12-10 | Disposition: A | Discharge status: Home / Self Care | Payer: Medicare PPO | Source: Ambulatory Visit | Attending: Surgery | Admitting: Surgery

## 2022-12-10 ENCOUNTER — Other Ambulatory Visit: Payer: Self-pay | Admitting: Surgery

## 2022-12-10 DIAGNOSIS — Z853 Personal history of malignant neoplasm of breast: Secondary | ICD-10-CM

## 2022-12-10 DIAGNOSIS — Z1231 Encounter for screening mammogram for malignant neoplasm of breast: Secondary | ICD-10-CM | POA: Insufficient documentation

## 2023-08-08 ENCOUNTER — Inpatient Hospital Stay (HOSPITAL_COMMUNITY)
Admission: EM | Admit: 2023-08-08 | Discharge: 2023-08-12 | DRG: 682 | Disposition: A | Discharge status: Skilled Nursing Facility | Payer: Medicare PPO | Attending: Internal Medicine | Admitting: Internal Medicine

## 2023-08-08 ENCOUNTER — Other Ambulatory Visit: Payer: Self-pay

## 2023-08-08 ENCOUNTER — Encounter (HOSPITAL_COMMUNITY): Payer: Self-pay

## 2023-08-08 ENCOUNTER — Emergency Department (HOSPITAL_COMMUNITY): Payer: Medicare PPO

## 2023-08-08 DIAGNOSIS — F03A3 Unspecified dementia, mild, with mood disturbance: Secondary | ICD-10-CM | POA: Diagnosis present

## 2023-08-08 DIAGNOSIS — E876 Hypokalemia: Secondary | ICD-10-CM | POA: Diagnosis present

## 2023-08-08 DIAGNOSIS — Z7982 Long term (current) use of aspirin: Secondary | ICD-10-CM

## 2023-08-08 DIAGNOSIS — R112 Nausea with vomiting, unspecified: Secondary | ICD-10-CM | POA: Diagnosis present

## 2023-08-08 DIAGNOSIS — Z8673 Personal history of transient ischemic attack (TIA), and cerebral infarction without residual deficits: Secondary | ICD-10-CM

## 2023-08-08 DIAGNOSIS — R9431 Abnormal electrocardiogram [ECG] [EKG]: Secondary | ICD-10-CM | POA: Diagnosis present

## 2023-08-08 DIAGNOSIS — Z91040 Latex allergy status: Secondary | ICD-10-CM

## 2023-08-08 DIAGNOSIS — Z79899 Other long term (current) drug therapy: Secondary | ICD-10-CM

## 2023-08-08 DIAGNOSIS — Z66 Do not resuscitate: Secondary | ICD-10-CM | POA: Diagnosis present

## 2023-08-08 DIAGNOSIS — Z881 Allergy status to other antibiotic agents status: Secondary | ICD-10-CM

## 2023-08-08 DIAGNOSIS — Z79890 Hormone replacement therapy (postmenopausal): Secondary | ICD-10-CM

## 2023-08-08 DIAGNOSIS — N1831 Chronic kidney disease, stage 3a: Secondary | ICD-10-CM | POA: Diagnosis present

## 2023-08-08 DIAGNOSIS — K859 Acute pancreatitis without necrosis or infection, unspecified: Secondary | ICD-10-CM | POA: Diagnosis present

## 2023-08-08 DIAGNOSIS — F32A Depression, unspecified: Secondary | ICD-10-CM | POA: Diagnosis present

## 2023-08-08 DIAGNOSIS — Z885 Allergy status to narcotic agent status: Secondary | ICD-10-CM

## 2023-08-08 DIAGNOSIS — K219 Gastro-esophageal reflux disease without esophagitis: Secondary | ICD-10-CM | POA: Diagnosis present

## 2023-08-08 DIAGNOSIS — E785 Hyperlipidemia, unspecified: Secondary | ICD-10-CM | POA: Diagnosis present

## 2023-08-08 DIAGNOSIS — N179 Acute kidney failure, unspecified: Principal | ICD-10-CM | POA: Diagnosis present

## 2023-08-08 DIAGNOSIS — R55 Syncope and collapse: Principal | ICD-10-CM

## 2023-08-08 DIAGNOSIS — Z9012 Acquired absence of left breast and nipple: Secondary | ICD-10-CM

## 2023-08-08 DIAGNOSIS — D72829 Elevated white blood cell count, unspecified: Secondary | ICD-10-CM | POA: Diagnosis present

## 2023-08-08 DIAGNOSIS — Z8249 Family history of ischemic heart disease and other diseases of the circulatory system: Secondary | ICD-10-CM

## 2023-08-08 DIAGNOSIS — I130 Hypertensive heart and chronic kidney disease with heart failure and stage 1 through stage 4 chronic kidney disease, or unspecified chronic kidney disease: Secondary | ICD-10-CM | POA: Diagnosis present

## 2023-08-08 DIAGNOSIS — E860 Dehydration: Secondary | ICD-10-CM | POA: Diagnosis present

## 2023-08-08 DIAGNOSIS — E871 Hypo-osmolality and hyponatremia: Secondary | ICD-10-CM | POA: Diagnosis present

## 2023-08-08 DIAGNOSIS — Z809 Family history of malignant neoplasm, unspecified: Secondary | ICD-10-CM

## 2023-08-08 DIAGNOSIS — Z888 Allergy status to other drugs, medicaments and biological substances status: Secondary | ICD-10-CM

## 2023-08-08 DIAGNOSIS — I10 Essential (primary) hypertension: Secondary | ICD-10-CM | POA: Diagnosis present

## 2023-08-08 DIAGNOSIS — G9341 Metabolic encephalopathy: Secondary | ICD-10-CM

## 2023-08-08 DIAGNOSIS — Z1152 Encounter for screening for COVID-19: Secondary | ICD-10-CM

## 2023-08-08 DIAGNOSIS — R7989 Other specified abnormal findings of blood chemistry: Secondary | ICD-10-CM

## 2023-08-08 DIAGNOSIS — Z853 Personal history of malignant neoplasm of breast: Secondary | ICD-10-CM

## 2023-08-08 DIAGNOSIS — I5032 Chronic diastolic (congestive) heart failure: Secondary | ICD-10-CM | POA: Diagnosis present

## 2023-08-08 DIAGNOSIS — E039 Hypothyroidism, unspecified: Secondary | ICD-10-CM | POA: Diagnosis present

## 2023-08-08 DIAGNOSIS — I951 Orthostatic hypotension: Secondary | ICD-10-CM | POA: Diagnosis present

## 2023-08-08 LAB — COMPREHENSIVE METABOLIC PANEL
ALT: 39 U/L (ref 0–44)
AST: 53 U/L — ABNORMAL HIGH (ref 15–41)
Albumin: 4.1 g/dL (ref 3.5–5.0)
Alkaline Phosphatase: 55 U/L (ref 38–126)
Anion gap: 20 — ABNORMAL HIGH (ref 5–15)
BUN: 52 mg/dL — ABNORMAL HIGH (ref 8–23)
CO2: 22 mmol/L (ref 22–32)
Calcium: 9.1 mg/dL (ref 8.9–10.3)
Chloride: 89 mmol/L — ABNORMAL LOW (ref 98–111)
Creatinine, Ser: 3.33 mg/dL — ABNORMAL HIGH (ref 0.44–1.00)
GFR, Estimated: 14 mL/min — ABNORMAL LOW (ref >60)
Glucose, Bld: 79 mg/dL (ref 70–99)
Potassium: 2.3 mmol/L — CL (ref 3.5–5.1)
Sodium: 131 mmol/L — ABNORMAL LOW (ref 135–145)
Total Bilirubin: 1.2 mg/dL (ref 0.3–1.2)
Total Protein: 8.1 g/dL (ref 6.5–8.1)

## 2023-08-08 LAB — CBC WITH DIFFERENTIAL/PLATELET
Abs Immature Granulocytes: 0.16 10*3/uL — ABNORMAL HIGH (ref 0.00–0.07)
Basophils Absolute: 0.1 10*3/uL (ref 0.0–0.1)
Basophils Relative: 0 %
Eosinophils Absolute: 0.1 10*3/uL (ref 0.0–0.5)
Eosinophils Relative: 0 %
HCT: 38.6 % (ref 36.0–46.0)
Hemoglobin: 13.8 g/dL (ref 12.0–15.0)
Immature Granulocytes: 1 %
Lymphocytes Relative: 15 %
Lymphs Abs: 2.6 10*3/uL (ref 0.7–4.0)
MCH: 31.5 pg (ref 26.0–34.0)
MCHC: 35.8 g/dL (ref 30.0–36.0)
MCV: 88.1 fL (ref 80.0–100.0)
Monocytes Absolute: 1.5 10*3/uL — ABNORMAL HIGH (ref 0.1–1.0)
Monocytes Relative: 9 %
Neutro Abs: 12.4 10*3/uL — ABNORMAL HIGH (ref 1.7–7.7)
Neutrophils Relative %: 75 %
Platelets: 270 10*3/uL (ref 150–400)
RBC: 4.38 MIL/uL (ref 3.87–5.11)
RDW: 12.9 % (ref 11.5–15.5)
WBC: 16.7 10*3/uL — ABNORMAL HIGH (ref 4.0–10.5)
nRBC: 0 % (ref 0.0–0.2)

## 2023-08-08 LAB — TSH: TSH: 3.215 u[IU]/mL (ref 0.350–4.500)

## 2023-08-08 LAB — TROPONIN I (HIGH SENSITIVITY): Troponin I (High Sensitivity): 90 ng/L — ABNORMAL HIGH (ref <18)

## 2023-08-08 LAB — AMMONIA: Ammonia: <10 umol/L (ref 9–35)

## 2023-08-08 LAB — LIPASE, BLOOD: Lipase: 33 U/L (ref 11–51)

## 2023-08-08 LAB — MAGNESIUM: Magnesium: 1.3 mg/dL — ABNORMAL LOW (ref 1.7–2.4)

## 2023-08-08 MED ORDER — POTASSIUM CHLORIDE 10 MEQ/100ML IV SOLN
10.0000 meq | INTRAVENOUS | Status: AC
  Administered 2023-08-08 – 2023-08-09 (×3): 10 meq via INTRAVENOUS
  Filled 2023-08-08 (×3): qty 100

## 2023-08-08 MED ORDER — SODIUM CHLORIDE 0.9 % IV BOLUS
1000.0000 mL | Freq: Once | INTRAVENOUS | Status: AC
  Administered 2023-08-08: 1000 mL via INTRAVENOUS

## 2023-08-08 MED ORDER — ONDANSETRON HCL 4 MG/2ML IJ SOLN
4.0000 mg | Freq: Once | INTRAMUSCULAR | Status: AC
  Administered 2023-08-08: 4 mg via INTRAVENOUS
  Filled 2023-08-08: qty 2

## 2023-08-08 MED ORDER — MORPHINE SULFATE (PF) 4 MG/ML IV SOLN
4.0000 mg | Freq: Once | INTRAVENOUS | Status: AC
  Administered 2023-08-08: 4 mg via INTRAVENOUS
  Filled 2023-08-08: qty 1

## 2023-08-08 NOTE — ED Provider Notes (Signed)
[place] EMERGENCY DEPARTMENT AT [facility] Provider Note   CSN: [id number] Arrival date & time: 08/08/23  2123     History  Chief Complaint  Patient presents with   Loss of Consciousness    Cindy Huerta is a 77 y.o. female.  This is a 77 year old female who presents emergency department today due to persistent vomiting and a syncopal episode.  Patient is here with son who is at bedside and provides history.  Reportedly, patient has been having vomiting over the last several days has been unable to keep anything down.  Today, while she was in the bathroom she had a syncopal episode.  There is no head strike.  Son tells me that baseline, the patient is able to ambulate, has only mild short-term memory issues, is able to clothe herself, and toilet without assistance.  Reviewed the patient's most recent hospital discharge note from 1 year ago.  Patient's past medical history significant for hyperlipidemia, hypertension, depression and anxiety, chronic vomiting.   Loss of Consciousness      Home Medications Prior to Admission medications   Medication Sig Start Date End Date Taking? Authorizing Provider  acetaminophen (TYLENOL) 500 MG tablet Take 500-1,000 mg by mouth every 6 (six) hours as needed for moderate pain.    [provider]  alum & mag hydroxide-simeth (MAALOX/MYLANTA) 200-200-20 MG/5ML suspension Take 30 mLs by mouth every 6 (six) hours as needed for indigestion or heartburn.    [provider]  amLODipine (NORVASC) 10 MG tablet Take 1 tablet (10 mg total) by mouth daily. 08/25/22   Amin, Ankit C, MD  ARTIFICIAL TEARS 0.1-0.3 % SOLN Place 1 drop into both eyes daily as needed for dry eyes. 07/15/22   [provider]  aspirin 81 MG EC tablet Take 1 tablet (81 mg total) by mouth daily. RESTART 48HRS AFTER DISCHARGE Patient not taking: Reported on 07/27/2022 11/25/21   Sung Amabile, DO  atorvastatin (LIPITOR) 80 MG tablet Take 80 mg  by mouth daily. Patient not taking: Reported on 07/27/2022 09/21/21   [provider]  BIOFREEZE 4 % GEL Apply 1 application  topically 2 (two) times daily. 07/15/22   [provider]  bisacodyl (DULCOLAX) 10 MG suppository Place 1 suppository (10 mg total) rectally daily at 12 noon. 07/30/22   Rolly Salter, MD  Cholecalciferol (VITAMIN D-3 PO) Take 1 capsule by mouth daily.    [provider]  docusate sodium (COLACE) 100 MG capsule Take 1 capsule (100 mg total) by mouth 2 (two) times daily. 07/01/22   Danford, Earl Lites, MD  Ferrous Sulfate (IRON PO) Take 1 tablet by mouth daily.    [provider]  FLUoxetine (PROZAC) 20 MG capsule Take 20 mg by mouth daily.    [provider]  fluticasone (FLONASE) 50 MCG/ACT nasal spray Place 1 spray into both nostrils daily as needed for allergies or rhinitis.    [provider]  levothyroxine (SYNTHROID) 75 MCG tablet Take 75 mcg by mouth daily before breakfast. 02/10/21   [provider]  LORazepam (ATIVAN) 0.5 MG tablet Take 1 tablet (0.5 mg total) by mouth daily. 07/01/22   Danford, Earl Lites, MD  magnesium oxide (MAG-OX) 400 MG tablet Take 1 tablet (400 mg total) by mouth daily. 07/30/22   Rolly Salter, MD  meclizine (ANTIVERT) 12.5 MG tablet Take 12.5 mg by mouth 3 (three) times daily as needed for dizziness.    [provider]  metoprolol succinate (TOPROL-XL)  50 MG 24 hr tablet Take 50 mg by mouth 2 (two) times daily. 03/18/21   [provider]  Multiple Vitamins-Minerals (THERA-M) TABS Take 1 tablet by mouth daily.    [provider]  Nutritional Supplements (ENSURE NUTRITION SHAKE) LIQD Take 1 Bottle by mouth 2 (two) times daily as needed (loss of appetite).    [provider]  ondansetron (ZOFRAN-ODT) 4 MG disintegrating tablet Take 1 tablet (4 mg total) by mouth every 8 (eight) hours as needed for nausea or vomiting. 07/30/22   Rolly Salter, MD   pantoprazole (PROTONIX) 40 MG tablet Take 1 tablet (40 mg total) by mouth daily. 10/10/21 09/11/22  Lolita Patella B, MD  polyethylene glycol powder (GLYCOLAX/MIRALAX) 17 GM/SCOOP powder Take 1 capful (17 g) with water by mouth daily. 07/30/22   Rolly Salter, MD  telmisartan (MICARDIS) 80 MG tablet Take 1 tablet (80 mg total) by mouth daily. 07/30/22   Rolly Salter, MD  trolamine salicylate (ASPERCREME) 10 % cream Apply 1 application topically as needed for muscle pain.    [provider]      Allergies    Latex, Novocain [procaine], Zestril [lisinopril], Benadryl [diphenhydramine], Biaxin [clarithromycin], and Roxicodone [oxycodone]    Review of Systems   Review of Systems  Cardiovascular:  Positive for syncope.    Physical Exam Updated Vital Signs BP (!) 178/87 (BP Location: Right Arm)   Pulse 100   Temp 99.1 F (37.3 C) (Oral)   Resp (!) 22   SpO2 100%  Physical Exam Vitals reviewed.  Constitutional:      General: She is not in acute distress.    Comments: Patient does seem a bit confused  HENT:     Head: Normocephalic and atraumatic.     Nose: Nose normal.     Mouth/Throat:     Mouth: Mucous membranes are dry.  Eyes:     Pupils: Pupils are equal, round, and reactive to light.  Cardiovascular:     Rate and Rhythm: Normal rate and regular rhythm.  Pulmonary:     Effort: Pulmonary effort is normal. No respiratory distress.  Abdominal:     General: Abdomen is flat.     Palpations: Abdomen is soft.     Tenderness: There is generalized abdominal tenderness. There is no guarding or rebound.  Musculoskeletal:     Cervical back: Normal range of motion.  Skin:    General: Skin is warm and dry.  Neurological:     Mental Status: She is alert.     Comments: Patient able to move all extremities spontaneously.  Converses with son     ED Results / Procedures / Treatments   Labs (all labs ordered are listed, but only abnormal results are displayed) Labs  Reviewed  CULTURE, BLOOD (ROUTINE X 2)  CULTURE, BLOOD (ROUTINE X 2)  RESP PANEL BY RT-PCR (RSV, FLU A&B, COVID)  [id number]  COMPREHENSIVE METABOLIC PANEL  LIPASE, BLOOD  CBC WITH DIFFERENTIAL/PLATELET  URINALYSIS, W/ REFLEX TO CULTURE (INFECTION SUSPECTED)  AMMONIA  TSH  TROPONIN I (HIGH SENSITIVITY)    EKG None  Radiology No results found.  Procedures Procedures    Medications Ordered in ED Medications  ondansetron (ZOFRAN) injection 4 mg (has no administration in time range)    ED Course/ Medical Decision Making/ A&P  Medical Decision Making This is a 77 year old female presents emergency department after a syncopal episode, days of vomiting.  Differential diagnoses include bowel obstruction, enteritis, gastritis, dehydration, acute kidney injury, acute intra-abdominal infection, cardiogenic syncope.  Plan-with the patient's vomiting, will obtain CT imaging of the abdomen pelvis.  I believe that this is likely the nidus for her syncopal episode as the patient has not been eating or drinking over the last few days.  Will see if there is a cause that we can identify for the symptoms.  Will symptomatically treat.  Will provide the patient with IV fluids.  Labs ordered.  Anticipate admission for the patient.  Patient will be signed out to overnight team pending CT imaging labs, disposition.  Amount and/or Complexity of Data Reviewed Labs: ordered. Radiology: ordered.  Risk Prescription drug management.           Final Clinical Impression(s) / ED Diagnoses Final diagnoses:  None    Rx / DC Orders ED Discharge Orders     None         Arletha Pili, DO 08/08/23 2223

## 2023-08-08 NOTE — ED Triage Notes (Addendum)
Pt bib [facility] coming from home w/ c/o syncopal episode. Called out by family, since Thursday n/v/d and weakness, decreased oral intake. Any time she tries to sit up or stand, she goes unconscious. Pt went unconscious 3x during EMS transport. States 70/30 and hr of 160 when trying to sit up. Dementia, at baseline. 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 Pt has hx of GERD, HTN, hyperlipemia. EMS started pt on LR. Pt alert and oriented x3.  EMS vital signs:  Cbg 160 178/100 ;laying flat, 110 HR  100% on RA etCO2 17 20 right foot

## 2023-08-08 NOTE — ED Provider Notes (Incomplete)
Care assumed from Dr. Andria Meuse, patient with persistent vomiting, syncope, confusion. She will need admission, laboratory workup is pending.

## 2023-08-09 ENCOUNTER — Observation Stay (HOSPITAL_COMMUNITY): Payer: Medicare PPO

## 2023-08-09 DIAGNOSIS — Z1152 Encounter for screening for COVID-19: Secondary | ICD-10-CM | POA: Diagnosis not present

## 2023-08-09 DIAGNOSIS — R9431 Abnormal electrocardiogram [ECG] [EKG]: Secondary | ICD-10-CM | POA: Diagnosis present

## 2023-08-09 DIAGNOSIS — Z66 Do not resuscitate: Secondary | ICD-10-CM | POA: Diagnosis present

## 2023-08-09 DIAGNOSIS — N179 Acute kidney failure, unspecified: Secondary | ICD-10-CM

## 2023-08-09 DIAGNOSIS — I130 Hypertensive heart and chronic kidney disease with heart failure and stage 1 through stage 4 chronic kidney disease, or unspecified chronic kidney disease: Secondary | ICD-10-CM | POA: Diagnosis present

## 2023-08-09 DIAGNOSIS — Z91040 Latex allergy status: Secondary | ICD-10-CM | POA: Diagnosis not present

## 2023-08-09 DIAGNOSIS — N1831 Chronic kidney disease, stage 3a: Secondary | ICD-10-CM | POA: Diagnosis present

## 2023-08-09 DIAGNOSIS — E860 Dehydration: Secondary | ICD-10-CM | POA: Diagnosis present

## 2023-08-09 DIAGNOSIS — R55 Syncope and collapse: Secondary | ICD-10-CM | POA: Diagnosis present

## 2023-08-09 DIAGNOSIS — F03A3 Unspecified dementia, mild, with mood disturbance: Secondary | ICD-10-CM | POA: Diagnosis present

## 2023-08-09 DIAGNOSIS — E876 Hypokalemia: Secondary | ICD-10-CM | POA: Diagnosis present

## 2023-08-09 DIAGNOSIS — G9341 Metabolic encephalopathy: Secondary | ICD-10-CM

## 2023-08-09 DIAGNOSIS — K859 Acute pancreatitis without necrosis or infection, unspecified: Secondary | ICD-10-CM | POA: Diagnosis present

## 2023-08-09 DIAGNOSIS — R7989 Other specified abnormal findings of blood chemistry: Secondary | ICD-10-CM

## 2023-08-09 DIAGNOSIS — Z79890 Hormone replacement therapy (postmenopausal): Secondary | ICD-10-CM | POA: Diagnosis not present

## 2023-08-09 DIAGNOSIS — E039 Hypothyroidism, unspecified: Secondary | ICD-10-CM | POA: Diagnosis present

## 2023-08-09 DIAGNOSIS — E785 Hyperlipidemia, unspecified: Secondary | ICD-10-CM | POA: Diagnosis present

## 2023-08-09 DIAGNOSIS — I5032 Chronic diastolic (congestive) heart failure: Secondary | ICD-10-CM | POA: Diagnosis present

## 2023-08-09 DIAGNOSIS — Z9012 Acquired absence of left breast and nipple: Secondary | ICD-10-CM | POA: Diagnosis not present

## 2023-08-09 DIAGNOSIS — E871 Hypo-osmolality and hyponatremia: Secondary | ICD-10-CM | POA: Diagnosis present

## 2023-08-09 DIAGNOSIS — R112 Nausea with vomiting, unspecified: Secondary | ICD-10-CM | POA: Diagnosis not present

## 2023-08-09 DIAGNOSIS — F32A Depression, unspecified: Secondary | ICD-10-CM | POA: Diagnosis present

## 2023-08-09 DIAGNOSIS — K219 Gastro-esophageal reflux disease without esophagitis: Secondary | ICD-10-CM | POA: Diagnosis present

## 2023-08-09 DIAGNOSIS — Z8673 Personal history of transient ischemic attack (TIA), and cerebral infarction without residual deficits: Secondary | ICD-10-CM | POA: Diagnosis not present

## 2023-08-09 DIAGNOSIS — I951 Orthostatic hypotension: Secondary | ICD-10-CM | POA: Diagnosis present

## 2023-08-09 HISTORY — DX: Acute kidney failure, unspecified: N17.9

## 2023-08-09 HISTORY — DX: Metabolic encephalopathy: G93.41

## 2023-08-09 LAB — CBC
HCT: 34.4 % — ABNORMAL LOW (ref 36.0–46.0)
HCT: 39 % (ref 36.0–46.0)
Hemoglobin: 11.7 g/dL — ABNORMAL LOW (ref 12.0–15.0)
Hemoglobin: 12.8 g/dL (ref 12.0–15.0)
MCH: 30.5 pg (ref 26.0–34.0)
MCH: 30.5 pg (ref 26.0–34.0)
MCHC: 34 g/dL (ref 30.0–36.0)
MCHC: 32.8 g/dL (ref 30.0–36.0)
MCV: 89.6 fL (ref 80.0–100.0)
MCV: 93.1 fL (ref 80.0–100.0)
Platelets: 207 10*3/uL (ref 150–400)
Platelets: 188 10*3/uL (ref 150–400)
RBC: 3.84 MIL/uL — ABNORMAL LOW (ref 3.87–5.11)
RBC: 4.19 MIL/uL (ref 3.87–5.11)
RDW: 13.1 % (ref 11.5–15.5)
RDW: 13.2 % (ref 11.5–15.5)
WBC: 14.3 10*3/uL — ABNORMAL HIGH (ref 4.0–10.5)
WBC: 13.7 10*3/uL — ABNORMAL HIGH (ref 4.0–10.5)
nRBC: 0 % (ref 0.0–0.2)
nRBC: 0 % (ref 0.0–0.2)

## 2023-08-09 LAB — COMPREHENSIVE METABOLIC PANEL
ALT: 28 U/L (ref 0–44)
AST: 38 U/L (ref 15–41)
Albumin: 3.2 g/dL — ABNORMAL LOW (ref 3.5–5.0)
Alkaline Phosphatase: 42 U/L (ref 38–126)
Anion gap: 13 (ref 5–15)
BUN: 46 mg/dL — ABNORMAL HIGH (ref 8–23)
CO2: 20 mmol/L — ABNORMAL LOW (ref 22–32)
Calcium: 7.7 mg/dL — ABNORMAL LOW (ref 8.9–10.3)
Chloride: 98 mmol/L (ref 98–111)
Creatinine, Ser: 2.73 mg/dL — ABNORMAL HIGH (ref 0.44–1.00)
GFR, Estimated: 17 mL/min — ABNORMAL LOW (ref >60)
Glucose, Bld: 89 mg/dL (ref 70–99)
Potassium: 3.1 mmol/L — ABNORMAL LOW (ref 3.5–5.1)
Sodium: 131 mmol/L — ABNORMAL LOW (ref 135–145)
Total Bilirubin: 1.1 mg/dL (ref 0.3–1.2)
Total Protein: 6.3 g/dL — ABNORMAL LOW (ref 6.5–8.1)

## 2023-08-09 LAB — ECHOCARDIOGRAM COMPLETE
Area-P 1/2: 4.68 cm2
Calc EF: 34.9 %
Height: 66 in
MV VTI: 2.33 cm2
S' Lateral: 2.4 cm
Single Plane A2C EF: 8.3 %
Single Plane A4C EF: 72.1 %
Weight: 2363.33 [oz_av]

## 2023-08-09 LAB — VITAMIN B12: Vitamin B-12: 873 pg/mL (ref 180–914)

## 2023-08-09 LAB — RESP PANEL BY RT-PCR (RSV, FLU A&B, COVID)  RVPGX2
Influenza A by PCR: NEGATIVE
Influenza B by PCR: NEGATIVE
Resp Syncytial Virus by PCR: NEGATIVE
SARS Coronavirus 2 by RT PCR: NEGATIVE

## 2023-08-09 LAB — BRAIN NATRIURETIC PEPTIDE: B Natriuretic Peptide: 76.7 pg/mL (ref 0.0–100.0)

## 2023-08-09 LAB — PHOSPHORUS: Phosphorus: 2.9 mg/dL (ref 2.5–4.6)

## 2023-08-09 LAB — TROPONIN I (HIGH SENSITIVITY): Troponin I (High Sensitivity): 93 ng/L — ABNORMAL HIGH (ref <18)

## 2023-08-09 LAB — MAGNESIUM: Magnesium: 1.2 mg/dL — ABNORMAL LOW (ref 1.7–2.4)

## 2023-08-09 MED ORDER — SENNOSIDES-DOCUSATE SODIUM 8.6-50 MG PO TABS: 1.0000 | ORAL_TABLET | Freq: Every evening | ORAL | Status: DC | PRN

## 2023-08-09 MED ORDER — HEPARIN SODIUM (PORCINE) 5000 UNIT/ML IJ SOLN
5000.0000 [IU] | Freq: Three times a day (TID) | INTRAMUSCULAR | Status: DC
  Administered 2023-08-09 – 2023-08-12 (×10): 5000 [IU] via SUBCUTANEOUS
  Filled 2023-08-09 (×10): qty 1

## 2023-08-09 MED ORDER — ACETAMINOPHEN 650 MG RE SUPP: 650.0000 mg | Freq: Four times a day (QID) | RECTAL | Status: DC | PRN

## 2023-08-09 MED ORDER — MAGNESIUM SULFATE 4 GM/100ML IV SOLN
4.0000 g | Freq: Once | INTRAVENOUS | Status: AC
  Administered 2023-08-09: 4 g via INTRAVENOUS
  Filled 2023-08-09: qty 100

## 2023-08-09 MED ORDER — ONDANSETRON HCL 4 MG/2ML IJ SOLN
4.0000 mg | Freq: Four times a day (QID) | INTRAMUSCULAR | Status: DC | PRN
Start: 2023-08-09 — End: 2023-08-09

## 2023-08-09 MED ORDER — ASPIRIN 81 MG PO TBEC
81.0000 mg | DELAYED_RELEASE_TABLET | Freq: Every day | ORAL | Status: DC
  Administered 2023-08-09 – 2023-08-12 (×4): 81 mg via ORAL
  Filled 2023-08-09 (×4): qty 1

## 2023-08-09 MED ORDER — POTASSIUM CHLORIDE 10 MEQ/100ML IV SOLN
10.0000 meq | INTRAVENOUS | Status: DC
Start: 2023-08-09 — End: 2023-08-09

## 2023-08-09 MED ORDER — TRIMETHOBENZAMIDE HCL 100 MG/ML IM SOLN
200.0000 mg | Freq: Three times a day (TID) | INTRAMUSCULAR | Status: DC | PRN
  Filled 2023-08-09: qty 2

## 2023-08-09 MED ORDER — FLUOXETINE HCL 20 MG PO CAPS
20.0000 mg | ORAL_CAPSULE | Freq: Every day | ORAL | Status: DC
  Administered 2023-08-09 – 2023-08-12 (×4): 20 mg via ORAL
  Filled 2023-08-09 (×4): qty 1

## 2023-08-09 MED ORDER — METOPROLOL SUCCINATE ER 50 MG PO TB24
50.0000 mg | ORAL_TABLET | Freq: Two times a day (BID) | ORAL | Status: DC
  Administered 2023-08-09 – 2023-08-11 (×6): 50 mg via ORAL
  Filled 2023-08-09 (×7): qty 1

## 2023-08-09 MED ORDER — MAGNESIUM SULFATE 2 GM/50ML IV SOLN
2.0000 g | Freq: Once | INTRAVENOUS | Status: AC
  Administered 2023-08-09: 2 g via INTRAVENOUS
  Filled 2023-08-09: qty 50

## 2023-08-09 MED ORDER — POTASSIUM CHLORIDE 10 MEQ/100ML IV SOLN
10.0000 meq | INTRAVENOUS | Status: AC
  Administered 2023-08-09 (×6): 10 meq via INTRAVENOUS
  Filled 2023-08-09 (×5): qty 100

## 2023-08-09 MED ORDER — ATORVASTATIN CALCIUM 80 MG PO TABS
80.0000 mg | ORAL_TABLET | Freq: Every day | ORAL | Status: DC
  Administered 2023-08-09 – 2023-08-12 (×4): 80 mg via ORAL
  Filled 2023-08-09 (×4): qty 1

## 2023-08-09 MED ORDER — METOPROLOL TARTRATE 5 MG/5ML IV SOLN: 5.0000 mg | INTRAVENOUS | Status: DC | PRN

## 2023-08-09 MED ORDER — SODIUM CHLORIDE 0.9 % IV SOLN: INTRAVENOUS | Status: AC

## 2023-08-09 MED ORDER — TRAZODONE HCL 50 MG PO TABS: 50.0000 mg | ORAL_TABLET | Freq: Every evening | ORAL | Status: DC | PRN

## 2023-08-09 MED ORDER — CLONIDINE HCL 0.1 MG PO TABS
0.1000 mg | ORAL_TABLET | Freq: Two times a day (BID) | ORAL | Status: DC
  Administered 2023-08-09 – 2023-08-12 (×7): 0.1 mg via ORAL
  Filled 2023-08-09 (×7): qty 1

## 2023-08-09 MED ORDER — LACTATED RINGERS IV BOLUS
1000.0000 mL | Freq: Once | INTRAVENOUS | Status: AC
  Administered 2023-08-09: 1000 mL via INTRAVENOUS

## 2023-08-09 MED ORDER — HYDRALAZINE HCL 20 MG/ML IJ SOLN: 10.0000 mg | INTRAMUSCULAR | Status: DC | PRN

## 2023-08-09 MED ORDER — PANTOPRAZOLE SODIUM 40 MG PO TBEC
40.0000 mg | DELAYED_RELEASE_TABLET | Freq: Every day | ORAL | Status: DC
  Administered 2023-08-09 – 2023-08-12 (×4): 40 mg via ORAL
  Filled 2023-08-09 (×4): qty 1

## 2023-08-09 MED ORDER — LEVOTHYROXINE SODIUM 75 MCG PO TABS
75.0000 ug | ORAL_TABLET | Freq: Every day | ORAL | Status: DC
  Administered 2023-08-10 – 2023-08-12 (×3): 75 ug via ORAL
  Filled 2023-08-09 (×3): qty 1

## 2023-08-09 MED ORDER — ACETAMINOPHEN 325 MG PO TABS
650.0000 mg | ORAL_TABLET | Freq: Four times a day (QID) | ORAL | Status: DC | PRN
  Administered 2023-08-11: 650 mg via ORAL

## 2023-08-09 MED ORDER — AMLODIPINE BESYLATE 10 MG PO TABS
10.0000 mg | ORAL_TABLET | Freq: Every day | ORAL | Status: DC
  Administered 2023-08-09 – 2023-08-12 (×4): 10 mg via ORAL
  Filled 2023-08-09 (×4): qty 1

## 2023-08-09 MED ORDER — IPRATROPIUM-ALBUTEROL 0.5-2.5 (3) MG/3ML IN SOLN: 3.0000 mL | RESPIRATORY_TRACT | Status: DC | PRN

## 2023-08-09 NOTE — Progress Notes (Signed)
PROGRESS NOTE    Cindy Huerta  NGE:[id number] DOB: [birth year] DOA: 08/08/2023 PCP: Dorothey Baseman, MD   Brief Narrative:  77 year old with history of HTN, HLD, hypothyroidism, GERD, CKD stage III, GI bleed, TIA, CHF with preserved EF, depression, breast cancer, cholecystectomy comes to the hospital with persistent vomiting, syncope and confusion.  With EMS patient was hypotensive and syncopized multiple times.  Lab work showed hypokalemia, acute kidney injury.  CT head was negative.  CT abdomen pelvis showed possible acute pancreatitis.   Assessment & Plan:  Principal Problem:   Intractable nausea and vomiting Active Problems:   Hyponatremia   Chronic diastolic CHF (congestive heart failure) (HCC)   Hypothyroidism   Hypokalemia   Benign essential hypertension   AKI (acute kidney injury) (HCC)   Syncope   Hypomagnesemia   QT prolongation   Acute metabolic encephalopathy   Elevated troponin    Intractable nausea and vomiting Possible acute mild pancreatitis as seen on CT scan but lipase levels are negative.  No other abnormality seen on CT abdomen pelvis.  LFTs are normal.  UA is pending.  IV fluids, supportive care.  Antiemetics as needed, diet as tolerated.    AKI on CKD stage III Baseline creatinine 1.2, admission creatinine 3.3 which is slowly improving.  Continue IV fluids and hold nephrotoxic drugs.   Syncope Likely due to severe dehydration/orthostatic hypotension.  PE less likely given no chest pain or hypoxia.  Patient is currently not tachycardic or tachypneic.  No seizure-like activity reported by family.  Hold home antihypertensives at this time.  Continue IV fluid hydration and check orthostatics.  Echocardiogram ordered given EKG changes although troponins not consistent with ACS.  Fall precautions.   Hypokalemia/hypomagnesemia Prolonged QTc Aggressively replete electrolytes.  Check phosphorus   Mild hyponatremia Likely from dehydration, getting IV fluids    Acute metabolic encephalopathy Mildly confused but overall answers most of the question.  Ammonia, TSH, B12 are normal.  No focal neurodeficits.  UA still pending.   Elevated troponin and EKG changes EKG showing new ST depressions/T wave inversions inferolaterally compared to previous EKG from a year ago.  Troponins overall remain flat.  Denies any chest pain.  Echocardiogram ordered   Chronic HFpEF Echo done 2 years ago showing EF 55 to 60%, grade 2 diastolic dysfunction, normal RV function, trivial MVR.  No obvious signs of volume overload.  BNP is normal   Hypertension Hold antihypertensives at this time and check orthostatics.  Will resume when appropriate   Hyperlipidemia Hypothyroidism: TSH normal. GERD History of TIA Depression   I request pharmacy to update home medications reconciliation.  Will resume appropriately   DVT prophylaxis: heparin injection 5,000 Units Start: 08/09/23 0600 Code Status: DNR Family Communication: Son [name] updated Continue hospital stay for ongoing evaluation for multiple electrolyte abnormality, PT/OT, echocardiogram    Subjective: Seen and examined at bedside, tells me overall she feels quite weak   Examination:  General exam: Appears calm and comfortable, elderly frail Respiratory system: Clear to auscultation. Respiratory effort normal. Cardiovascular system: S1 & S2 heard, RRR. No JVD, murmurs, rubs, gallops or clicks. No pedal edema. Gastrointestinal system: Abdomen is nondistended, soft and nontender. No organomegaly or masses felt. Normal bowel sounds heard. Central nervous system: Alert and oriented. No focal neurological deficits. Extremities: Symmetric 4 x 5 power. Skin: No rashes, lesions or ulcers Psychiatry: Judgement and insight appear normal. Mood & affect appropriate.      Diet Orders (From admission, onward)     Start  Ordered   08/09/23 0322  Diet Heart Room service appropriate? Yes; Fluid consistency: Thin   Diet effective now       Question Answer Comment  Room service appropriate? Yes   Fluid consistency: Thin      08/09/23 0325            Objective: Vitals:   08/09/23 0215 08/09/23 0230 08/09/23 0245 08/09/23 0335  BP: (!) 147/62 (!) 145/69 (!) 132/119 (!) 162/71  Pulse: 89 86 87 85  Resp: 18 14 17 19   Temp:    98.3 F (36.8 C)  TempSrc:    Oral  SpO2: 99% 100% 99% 99%  Weight:    67 kg  Height:    5\' 6"  (1.676 m)   No intake or output data in the 24 hours ending 08/09/23 0813 Filed Weights   08/09/23 0335  Weight: 67 kg    Scheduled Meds:  heparin  5,000 Units Subcutaneous Q8H   Continuous Infusions:  sodium chloride 125 mL/hr at 08/09/23 0516   potassium chloride 10 mEq (08/09/23 0655)    Nutritional status     Body mass index is 23.84 kg/m.  Data Reviewed:   CBC: Recent Labs  Lab 08/08/23 2227 08/09/23 0615  WBC 16.7* 14.3*  NEUTROABS 12.4*  --   HGB 13.8 11.7*  HCT 38.6 34.4*  MCV 88.1 89.6  PLT 270 207   Basic Metabolic Panel: Recent Labs  Lab 08/08/23 2227 08/09/23 0615  NA 131* 131*  K 2.3* 3.1*  CL 89* 98  CO2 22 20*  GLUCOSE 79 89  BUN 52* 46*  CREATININE 3.33* 2.73*  CALCIUM 9.1 7.7*  MG 1.3* 1.2*   GFR: Estimated Creatinine Clearance: 15.6 mL/min (A) (by C-G formula based on SCr of 2.73 mg/dL (H)). Liver Function Tests: Recent Labs  Lab 08/08/23 2227 08/09/23 0615  AST 53* 38  ALT 39 28  ALKPHOS 55 42  BILITOT 1.2 1.1  PROT 8.1 6.3*  ALBUMIN 4.1 3.2*   Recent Labs  Lab 08/08/23 2227  LIPASE 33   Recent Labs  Lab 08/08/23 2227  AMMONIA <10   Coagulation Profile: No results for input(s): "INR", "PROTIME" in the last 168 hours. Cardiac Enzymes: No results for input(s): "CKTOTAL", "CKMB", "CKMBINDEX", "TROPONINI" in the last 168 hours. BNP (last 3 results) No results for input(s): "PROBNP" in the last 8760 hours. HbA1C: No results for input(s): "HGBA1C" in the last 72 hours. CBG: No results for input(s):  "GLUCAP" in the last 168 hours. Lipid Profile: No results for input(s): "CHOL", "HDL", "LDLCALC", "TRIG", "CHOLHDL", "LDLDIRECT" in the last 72 hours. Thyroid Function Tests: Recent Labs    08/08/23 2227  TSH 3.215   Anemia Panel: Recent Labs    08/09/23 0615  VITAMINB12 873   Sepsis Labs: No results for input(s): "PROCALCITON", "LATICACIDVEN" in the last 168 hours.  Recent Results (from the past 240 hour(s))  Culture, blood (routine x 2)     Status: None (Preliminary result)   Collection Time: 08/08/23 10:27 PM   Specimen: BLOOD  Result Value Ref Range Status   Specimen Description BLOOD SITE NOT SPECIFIED  Final   Special Requests   Final    BOTTLES DRAWN AEROBIC AND ANAEROBIC Blood Culture adequate volume   Culture   Final    NO GROWTH < 12 HOURS Performed at [facility], [address], [place], [place] [address]    Report Status PENDING  Incomplete  Resp panel by RT-PCR (RSV, Flu  A&B, Covid) Anterior Nasal Swab     Status: None   Collection Time: 08/09/23 12:24 AM   Specimen: Anterior Nasal Swab  Result Value Ref Range Status   SARS Coronavirus 2 by RT PCR NEGATIVE NEGATIVE Final   Influenza A by PCR NEGATIVE NEGATIVE Final   Influenza B by PCR NEGATIVE NEGATIVE Final    Comment: (NOTE) The Xpert Xpress SARS-CoV-2/FLU/RSV plus assay is intended as an aid in the diagnosis of influenza from Nasopharyngeal swab specimens and should not be used as a sole basis for treatment. Nasal washings and aspirates are unacceptable for Xpert Xpress SARS-CoV-2/FLU/RSV testing.  Fact Sheet for Patients: BloggerCourse.com  Fact Sheet for Healthcare Providers: SeriousBroker.it  This test is not yet approved or cleared by the [place] FDA and has been authorized for detection and/or diagnosis of SARS-CoV-2 by FDA under an Emergency Use Authorization (EUA). This EUA will remain in effect (meaning this test can be used)  for the duration of the COVID-19 declaration under Section 564(b)(1) of the Act, 21 U.S.C. section 360bbb-3(b)(1), unless the authorization is terminated or revoked.     Resp Syncytial Virus by PCR NEGATIVE NEGATIVE Final    Comment: (NOTE) Fact Sheet for Patients: BloggerCourse.com  Fact Sheet for Healthcare Providers: SeriousBroker.it  This test is not yet approved or cleared by the [place] FDA and has been authorized for detection and/or diagnosis of SARS-CoV-2 by FDA under an Emergency Use Authorization (EUA). This EUA will remain in effect (meaning this test can be used) for the duration of the COVID-19 declaration under Section 564(b)(1) of the Act, 21 U.S.C. section 360bbb-3(b)(1), unless the authorization is terminated or revoked.  Performed at [facility], [address] [place], [place] [address]          Radiology Studies: CT ABDOMEN PELVIS WO CONTRAST  Result Date: 08/09/2023 CLINICAL DATA:  Abdominal pain and vomiting. EXAM: CT ABDOMEN AND PELVIS WITHOUT CONTRAST TECHNIQUE: Multidetector CT imaging of the abdomen and pelvis was performed following the standard protocol without IV contrast. RADIATION DOSE REDUCTION: This exam was performed according to the departmental dose-optimization program which includes automated exposure control, adjustment of the mA and/or kV according to patient size and/or use of iterative reconstruction technique. COMPARISON:  CTA chest, abdomen and pelvis 07/27/2022, CT abdomen and pelvis with IV contrast 06/05/2021. FINDINGS: Lower chest: Small chronic hiatal hernia. The cardiac size is normal. Visualized lung bases are clear. The right hemidiaphragm again mildly elevated with the dome of the right hemidiaphragm excluded from the study. Hepatobiliary: The dome of the liver was partially excluded from the study. The visualized liver demonstrating no appreciable mass without  contrast. Gallbladder again noted absent without biliary dilatation. Streak artifact from the patient's arms in the field limits fine detail. Pancreas: There is suspected stranding around the pancreatic head, uncinate process and neck although the finding could be exaggerated due to artifact from the patient's arms. Correlate with serum lipase for possible acute pancreatitis. The pancreas is partially atrophic and otherwise unremarkable. There is no appreciable ductal dilatation. Spleen: Unremarkable without contrast and allowing for superimposed streak artifacts. Adrenals/Urinary Tract: There is no adrenal mass. There is no contour deforming abnormality of either kidney. There is no urinary stone or obstruction. The bladder thickness is normal. Stomach/Bowel: No dilatation or wall thickening. An appendix is not seen in this patient. There is fluid in the ascending colon. There are colonic diverticula most numerous along the descending and sigmoid segments but no findings of acute  diverticulitis. Vascular/Lymphatic: Aortic atherosclerosis. No enlarged abdominal or pelvic lymph nodes. Reproductive: Status post hysterectomy. No adnexal masses. Other: No abdominal wall hernia or abnormality. No abdominopelvic ascites. There is no peripancreatic localizing fluid collection, free hemorrhage or free air. Musculoskeletal: There is osteopenia with degenerative changes of the spine, hips and SI joints. No acute or suspicious osseous findings. IMPRESSION: 1. Streak artifact from the patient's arms in the field limits fine detail. 2. There appears to be stranding around the pancreatic head, uncinate process and neck. Correlate with serum lipase for possible acute pancreatitis. No peripancreatic fluid or hemorrhage is seen. 3. Fluid in the ascending colon. No bowel wall thickening or dilatation. 4. Diverticulosis without evidence of diverticulitis. 5. Aortic atherosclerosis. 6. Small hiatal hernia. 7. Osteopenia and  degenerative change. Aortic Atherosclerosis (ICD10-I70.0). Electronically Signed   By: Almira Bar M.D.   On: 08/09/2023 00:49   CT Head Wo Contrast  Result Date: 08/09/2023 CLINICAL DATA:  Vomiting and syncopal episode. Altered mental status EXAM: CT HEAD WITHOUT CONTRAST TECHNIQUE: Contiguous axial images were obtained from the base of the skull through the vertex without intravenous contrast. RADIATION DOSE REDUCTION: This exam was performed according to the departmental dose-optimization program which includes automated exposure control, adjustment of the mA and/or kV according to patient size and/or use of iterative reconstruction technique. COMPARISON:  CT head 08/17/2022 FINDINGS: Brain: No intracranial hemorrhage, mass effect, or evidence of acute infarct. No hydrocephalus. No extra-axial fluid collection. Age-commensurate cerebral atrophy and ill-defined hypoattenuation within the cerebral white matter consistent with chronic small vessel ischemic disease. Vascular: No hyperdense vessel. Intracranial arterial calcification. Skull: No fracture or focal lesion. Sinuses/Orbits: No acute finding. Other: None. IMPRESSION: 1. No acute intracranial abnormality. Electronically Signed   By: Minerva Fester M.D.   On: 08/09/2023 00:37           LOS: 0 days   Time spent= 35 mins    Miguel Rota, MD [facility]  If 7PM-7AM, please contact night-coverage  08/09/2023, 8:13 AM

## 2023-08-09 NOTE — ED Notes (Signed)
MD at Bedside.

## 2023-08-09 NOTE — H&P (Signed)
History and Physical    Cindy Huerta WUJ:[id number] DOB: 1946-04-02 DOA: 08/08/2023  PCP: Dorothey Baseman, MD  Patient coming from: Home  Chief Complaint: Syncope  HPI: Cindy Huerta is a 77 y.o. female with medical history significant of hypertension, hyperlipidemia, hypothyroidism, GERD, CKD stage III, history of GI bleed, TIA, chronic HFpEF, depression, history of breast cancer, history of cholecystectomy presented to ED for evaluation of persistent vomiting, syncope, and confusion.  Patient lost consciousness 3 times with EMS and was found to be hypotensive with blood pressure 70/30 and tachycardic with heart rate of 160 when trying to sit up.  On arrival to the ED, blood pressure noted to be elevated at 178/87.  Satting 100% on room air.  Afebrile.  Labs notable for WBC 16.7, hemoglobin 13.8, sodium 131 (close to baseline), potassium 2.3, chloride 89, BUN 52, creatinine 3.3 (baseline 1.0-1.2), AST 53 and remainder of LFTs normal, UA pending, blood cultures collected, ammonia level <10, TSH normal, troponin 90> 93, magnesium 1.3, COVID/influenza/RSV PCR negative.  CT head negative for acute intracranial abnormality.  CT abdomen pelvis with stranding around the pancreatic head, uncinate process, and neck.  No pancreatic fluid or hemorrhage seen.  Lipase checked and normal.  No other acute findings on CT. Patient received morphine, Zofran, IV mag 4 g, IV potassium 10 mg x 4, and 2 L IV fluids in the ED.  [facility] called to admit.  Patient appears confused.  She is reporting vomiting and diarrhea for several days.  Tells me she passed out a few times at home and that is why her son called EMS.  Denies chest pain, abdominal pain, or shortness of breath.  She stays with her son Viviann Spare. I called and spoke to the patient's son Mr. Jameca Mork over the phone.  Son informed me that patient has mild dementia. Since Thursday, 8/29 patient has been vomiting and has not been able to tolerate p.o. intake.  Family  has not noticed any diarrhea.  Son states she has passed out a few times at home but every time family was present with her and she did not fall or sustain any injuries.  She has not had any fevers at home.    Review of Systems:  Review of Systems  All other systems reviewed and are negative.   Past Medical History:  Diagnosis Date   Arthritis    Breast cancer (HCC)    Depression    GERD (gastroesophageal reflux disease)    HLD (hyperlipidemia)    Hypertension    Hypothyroidism     Past Surgical History:  Procedure Laterality Date   ABDOMINAL HYSTERECTOMY  1982   AXILLARY SENTINEL NODE BIOPSY Left 11/20/2021   Procedure: AXILLARY SENTINEL NODE BIOPSY;  Surgeon: Sung Amabile, DO;  Location: [facility] ORS;  Service: General;  Laterality: Left;   BREAST BIOPSY Left 09/23/2021   3:00 13cmfn venus marker, path pending   BREAST BIOPSY Left 09/23/2021   3:00 14 cmfn heart marker, path pending   CHOLECYSTECTOMY     ESOPHAGOGASTRODUODENOSCOPY (EGD) WITH PROPOFOL N/A 10/08/2021   Procedure: ESOPHAGOGASTRODUODENOSCOPY (EGD) WITH PROPOFOL;  Surgeon: Wyline Mood, MD;  Location: [facility] ENDOSCOPY;  Service: Gastroenterology;  Laterality: N/A;   HEMATOMA EVACUATION Left 11/21/2021   Procedure: EVACUATION HEMATOMA;  Surgeon: Sung Amabile, DO;  Location: [facility] ORS;  Service: General;  Laterality: Left;   IR FLUORO GUIDE CV LINE LEFT  08/17/2022   IR US GUIDE VASC ACCESS LEFT  08/17/2022   IR US GUIDE  VASC ACCESS RIGHT  08/17/2022   left lumpectomy  2019   TOTAL MASTECTOMY Left 11/20/2021   Procedure: TOTAL MASTECTOMY;  Surgeon: Sung Amabile, DO;  Location: [facility] ORS;  Service: General;  Laterality: Left;     reports that she has never smoked. She has never used smokeless tobacco. She reports that she does not drink alcohol and does not use drugs.  Allergies  Allergen Reactions   Latex Rash   Novocain [Procaine] Other (See Comments)    Unsure - told by DDS not to let anyone give it to her   Confusion    Zestril [Lisinopril] Cough   Benadryl [Diphenhydramine] Other (See Comments)    Jitteriness Agitation   Biaxin [Clarithromycin] Other (See Comments)    Confusion     Roxicodone [Oxycodone] Nausea And Vomiting and Anxiety    Family History  Problem Relation Age of Onset   Heart disease Mother    Cancer Paternal Grandmother     Prior to Admission medications   Medication Sig Start Date End Date Taking? Authorizing Provider  acetaminophen (TYLENOL) 500 MG tablet Take 500-1,000 mg by mouth every 6 (six) hours as needed for moderate pain.    [provider]  alum & mag hydroxide-simeth (MAALOX/MYLANTA) 200-200-20 MG/5ML suspension Take 30 mLs by mouth every 6 (six) hours as needed for indigestion or heartburn.    [provider]  amLODipine (NORVASC) 10 MG tablet Take 1 tablet (10 mg total) by mouth daily. 08/25/22   Amin, Ankit C, MD  ARTIFICIAL TEARS 0.1-0.3 % SOLN Place 1 drop into both eyes daily as needed for dry eyes. 07/15/22   [provider]  aspirin 81 MG EC tablet Take 1 tablet (81 mg total) by mouth daily. RESTART 48HRS AFTER DISCHARGE Patient not taking: Reported on 07/27/2022 11/25/21   Sung Amabile, DO  atorvastatin (LIPITOR) 80 MG tablet Take 80 mg by mouth daily. Patient not taking: Reported on 07/27/2022 09/21/21   [provider]  BIOFREEZE 4 % GEL Apply 1 application  topically 2 (two) times daily. 07/15/22   [provider]  bisacodyl (DULCOLAX) 10 MG suppository Place 1 suppository (10 mg total) rectally daily at 12 noon. 07/30/22   Rolly Salter, MD  Cholecalciferol (VITAMIN D-3 PO) Take 1 capsule by mouth daily.    [provider]  docusate sodium (COLACE) 100 MG capsule Take 1 capsule (100 mg total) by mouth 2 (two) times daily. 07/01/22   Danford, Earl Lites, MD  Ferrous Sulfate (IRON PO) Take 1 tablet by mouth daily.    [provider]  FLUoxetine (PROZAC) 20 MG capsule Take 20 mg by  mouth daily.    [provider]  fluticasone (FLONASE) 50 MCG/ACT nasal spray Place 1 spray into both nostrils daily as needed for allergies or rhinitis.    [provider]  levothyroxine (SYNTHROID) 75 MCG tablet Take 75 mcg by mouth daily before breakfast. 02/10/21   [provider]  LORazepam (ATIVAN) 0.5 MG tablet Take 1 tablet (0.5 mg total) by mouth daily. 07/01/22   Danford, Earl Lites, MD  magnesium oxide (MAG-OX) 400 MG tablet Take 1 tablet (400 mg total) by mouth daily. 07/30/22   Rolly Salter, MD  meclizine (ANTIVERT) 12.5 MG tablet Take 12.5 mg by mouth 3 (three) times daily as needed for dizziness.    [provider]  metoprolol succinate (TOPROL-XL) 50 MG 24 hr tablet Take 50 mg by mouth 2 (two) times daily. 03/18/21   [provider]  Multiple Vitamins-Minerals (THERA-M) TABS Take 1 tablet by mouth daily.    [provider]  Nutritional Supplements (ENSURE NUTRITION SHAKE) LIQD Take 1 Bottle by mouth 2 (two) times daily as needed (loss of appetite).    [provider]  ondansetron (ZOFRAN-ODT) 4 MG disintegrating tablet Take 1 tablet (4 mg total) by mouth every 8 (eight) hours as needed for nausea or vomiting. 07/30/22   Rolly Salter, MD  pantoprazole (PROTONIX) 40 MG tablet Take 1 tablet (40 mg total) by mouth daily. 10/10/21 09/11/22  Lolita Patella B, MD  polyethylene glycol powder (GLYCOLAX/MIRALAX) 17 GM/SCOOP powder Take 1 capful (17 g) with water by mouth daily. 07/30/22   Rolly Salter, MD  telmisartan (MICARDIS) 80 MG tablet Take 1 tablet (80 mg total) by mouth daily. 07/30/22   Rolly Salter, MD  trolamine salicylate (ASPERCREME) 10 % cream Apply 1 application topically as needed for muscle pain.    [provider]    Physical Exam: Vitals:   08/08/23 2133 08/09/23 0100 08/09/23 0115  BP: (!) 178/87 (!) 171/71 (!) 164/68  Pulse: 100 87 89  Resp: (!) 22 16 15   Temp: 99.1 F (37.3 C)  98.9 F  (37.2 C)  TempSrc: Oral    SpO2: 100% 97% 98%    Physical Exam Vitals reviewed.  Constitutional:      General: She is not in acute distress. HENT:     Head: Normocephalic and atraumatic.     Mouth/Throat:     Mouth: Mucous membranes are dry.     Comments: Very dry mucous membranes Eyes:     Extraocular Movements: Extraocular movements intact.  Cardiovascular:     Rate and Rhythm: Normal rate and regular rhythm.     Pulses: Normal pulses.  Pulmonary:     Effort: Pulmonary effort is normal. No respiratory distress.     Breath sounds: Normal breath sounds. No wheezing or rales.  Abdominal:     General: Bowel sounds are normal. There is no distension.     Palpations: Abdomen is soft.     Tenderness: There is no abdominal tenderness. There is no guarding.  Musculoskeletal:     Cervical back: Normal range of motion.     Right lower leg: No edema.     Left lower leg: No edema.  Skin:    General: Skin is warm and dry.  Neurological:     General: No focal deficit present.     Mental Status: She is alert.     Cranial Nerves: No cranial nerve deficit.     Sensory: No sensory deficit.     Motor: No weakness.     Comments: AAO x 3 but appears confused Following commands appropriately.     Labs on Admission: I have personally reviewed following labs and imaging studies  CBC: Recent Labs  Lab 08/08/23 2227  WBC 16.7*  NEUTROABS 12.4*  HGB 13.8  HCT 38.6  MCV 88.1  PLT 270   Basic Metabolic Panel: Recent Labs  Lab 08/08/23 2227  NA 131*  K 2.3*  CL 89*  CO2 22  GLUCOSE 79  BUN 52*  CREATININE 3.33*  CALCIUM 9.1  MG 1.3*   GFR: CrCl cannot be calculated (Unknown ideal weight.). Liver Function Tests: Recent Labs  Lab 08/08/23 2227  AST 53*  ALT 39  ALKPHOS 55  BILITOT 1.2  PROT 8.1  ALBUMIN 4.1   Recent Labs  Lab 08/08/23 2227  LIPASE 33  Recent Labs  Lab 08/08/23 2227  AMMONIA <10   Coagulation Profile: No results for input(s): "INR",  "PROTIME" in the last 168 hours. Cardiac Enzymes: No results for input(s): "CKTOTAL", "CKMB", "CKMBINDEX", "TROPONINI" in the last 168 hours. BNP (last 3 results) No results for input(s): "PROBNP" in the last 8760 hours. HbA1C: No results for input(s): "HGBA1C" in the last 72 hours. CBG: No results for input(s): "GLUCAP" in the last 168 hours. Lipid Profile: No results for input(s): "CHOL", "HDL", "LDLCALC", "TRIG", "CHOLHDL", "LDLDIRECT" in the last 72 hours. Thyroid Function Tests: Recent Labs    08/08/23 2227  TSH 3.215   Anemia Panel: No results for input(s): "VITAMINB12", "FOLATE", "FERRITIN", "TIBC", "IRON", "RETICCTPCT" in the last 72 hours. Urine analysis:    Component Value Date/Time   COLORURINE STRAW (A) 08/23/2022 1843   APPEARANCEUR CLEAR (A) 08/23/2022 1843   LABSPEC 1.010 08/23/2022 1843   PHURINE 7.0 08/23/2022 1843   GLUCOSEU NEGATIVE 08/23/2022 1843   HGBUR NEGATIVE 08/23/2022 1843   BILIRUBINUR NEGATIVE 08/23/2022 1843   KETONESUR NEGATIVE 08/23/2022 1843   PROTEINUR NEGATIVE 08/23/2022 1843   NITRITE NEGATIVE 08/23/2022 1843   LEUKOCYTESUR NEGATIVE 08/23/2022 1843    Radiological Exams on Admission: CT ABDOMEN PELVIS WO CONTRAST  Result Date: 08/09/2023 CLINICAL DATA:  Abdominal pain and vomiting. EXAM: CT ABDOMEN AND PELVIS WITHOUT CONTRAST TECHNIQUE: Multidetector CT imaging of the abdomen and pelvis was performed following the standard protocol without IV contrast. RADIATION DOSE REDUCTION: This exam was performed according to the departmental dose-optimization program which includes automated exposure control, adjustment of the mA and/or kV according to patient size and/or use of iterative reconstruction technique. COMPARISON:  CTA chest, abdomen and pelvis 07/27/2022, CT abdomen and pelvis with IV contrast 06/05/2021. FINDINGS: Lower chest: Small chronic hiatal hernia. The cardiac size is normal. Visualized lung bases are clear. The right hemidiaphragm  again mildly elevated with the dome of the right hemidiaphragm excluded from the study. Hepatobiliary: The dome of the liver was partially excluded from the study. The visualized liver demonstrating no appreciable mass without contrast. Gallbladder again noted absent without biliary dilatation. Streak artifact from the patient's arms in the field limits fine detail. Pancreas: There is suspected stranding around the pancreatic head, uncinate process and neck although the finding could be exaggerated due to artifact from the patient's arms. Correlate with serum lipase for possible acute pancreatitis. The pancreas is partially atrophic and otherwise unremarkable. There is no appreciable ductal dilatation. Spleen: Unremarkable without contrast and allowing for superimposed streak artifacts. Adrenals/Urinary Tract: There is no adrenal mass. There is no contour deforming abnormality of either kidney. There is no urinary stone or obstruction. The bladder thickness is normal. Stomach/Bowel: No dilatation or wall thickening. An appendix is not seen in this patient. There is fluid in the ascending colon. There are colonic diverticula most numerous along the descending and sigmoid segments but no findings of acute diverticulitis. Vascular/Lymphatic: Aortic atherosclerosis. No enlarged abdominal or pelvic lymph nodes. Reproductive: Status post hysterectomy. No adnexal masses. Other: No abdominal wall hernia or abnormality. No abdominopelvic ascites. There is no peripancreatic localizing fluid collection, free hemorrhage or free air. Musculoskeletal: There is osteopenia with degenerative changes of the spine, hips and SI joints. No acute or suspicious osseous findings. IMPRESSION: 1. Streak artifact from the patient's arms in the field limits fine detail. 2. There appears to be stranding around the pancreatic head, uncinate process and neck. Correlate with serum lipase for possible acute pancreatitis. No peripancreatic fluid or  hemorrhage  is seen. 3. Fluid in the ascending colon. No bowel wall thickening or dilatation. 4. Diverticulosis without evidence of diverticulitis. 5. Aortic atherosclerosis. 6. Small hiatal hernia. 7. Osteopenia and degenerative change. Aortic Atherosclerosis (ICD10-I70.0). Electronically Signed   By: Almira Bar M.D.   On: 08/09/2023 00:49   CT Head Wo Contrast  Result Date: 08/09/2023 CLINICAL DATA:  Vomiting and syncopal episode. Altered mental status EXAM: CT HEAD WITHOUT CONTRAST TECHNIQUE: Contiguous axial images were obtained from the base of the skull through the vertex without intravenous contrast. RADIATION DOSE REDUCTION: This exam was performed according to the departmental dose-optimization program which includes automated exposure control, adjustment of the mA and/or kV according to patient size and/or use of iterative reconstruction technique. COMPARISON:  CT head 08/17/2022 FINDINGS: Brain: No intracranial hemorrhage, mass effect, or evidence of acute infarct. No hydrocephalus. No extra-axial fluid collection. Age-commensurate cerebral atrophy and ill-defined hypoattenuation within the cerebral white matter consistent with chronic small vessel ischemic disease. Vascular: No hyperdense vessel. Intracranial arterial calcification. Skull: No fracture or focal lesion. Sinuses/Orbits: No acute finding. Other: None. IMPRESSION: 1. No acute intracranial abnormality. Electronically Signed   By: Minerva Fester M.D.   On: 08/09/2023 00:37    EKG: Independently reviewed.  Sinus or ectopic atrial rhythm, new ST depressions/T wave inversions inferolaterally, QTc 540.  Assessment and Plan  Intractable nausea and vomiting WBC count 16.7, afebrile. AST mildly elevated at 53 and remainder of LFTs normal.  Testing negative for COVID and flu.  CT abdomen pelvis with stranding around the pancreatic head, uncinate process, and neck.  No pancreatic fluid or hemorrhage seen.  No other acute findings on CT.   Acute pancreatitis less likely as lipase is normal and patient has no abdominal pain or tenderness.  Continue IV fluid hydration and IM Tigan as needed for nausea/vomiting.  Avoid QT prolonging antiemetics.  UA pending to rule out UTI.  Continue to monitor WBC count.  AKI on CKD stage III Likely prerenal azotemia from severe dehydration in the setting of vomiting for the past 5 days.  She has very dry mucous membranes on exam.  BUN 52, creatinine 3.3 (baseline 1.0-1.2).  CT without evidence of obstructive uropathy.  Continue IV fluid hydration and monitor renal function.  Avoid nephrotoxic agents/hold home telmisartan.  Syncope Likely due to severe dehydration/orthostatic hypotension.  PE less likely given no chest pain or hypoxia.  Patient is currently not tachycardic or tachypneic.  No seizure-like activity reported by family.  Hold home antihypertensives at this time.  Continue IV fluid hydration and check orthostatics.  Echocardiogram ordered given EKG changes although troponins not consistent with ACS.  Fall precautions.  Hypokalemia Hypomagnesemia QT prolongation Replace electrolytes and continue to monitor labs closely.  Avoid QT prolonging drugs and repeat EKG in the morning.  Mild hyponatremia Sodium close to baseline.  Receiving IV fluids, continue to monitor labs.  Acute metabolic encephalopathy Patient is able to answer orientation questions appropriately and follows commands, however, appears confused.  Informed by her son that she does have mild dementia at baseline.  Ammonia level <10 and TSH normal.  CT head negative for acute intracranial abnormality and no focal neurodeficit on exam.  Check B12 level.  UA pending to rule out UTI.  Elevated troponin and EKG changes EKG showing new ST depressions/T wave inversions inferolaterally compared to previous EKG from a year ago.  Troponin elevated but stable (90> 93) and not consistent with ACS.  Patient denies chest pain.  Echocardiogram  ordered.  Chronic HFpEF Echo done 2 years ago showing EF 55 to 60%, grade 2 diastolic dysfunction, normal RV function, trivial MVR. No signs of volume overload at this time.  Check BNP and monitor volume status closely.  Hypertension Hold antihypertensives at this time and check orthostatics.  Hyperlipidemia Hypothyroidism: TSH normal. GERD History of TIA Depression Pharmacy med rec pending.  DVT prophylaxis: SQ Heparin Code Status: DNR/DNI (discussed with the patient's son Mr. Derrianna Burges) Family Communication: Son updated. Level of care: Telemetry bed Admission status: It is my clinical opinion that referral for OBSERVATION is reasonable and necessary in this patient based on the above information provided. The aforementioned taken together are felt to place the patient at high risk for further clinical deterioration. However, it is anticipated that the patient may be medically stable for discharge from the hospital within 24 to 48 hours.  John Giovanni MD [facility]  If 7PM-7AM, please contact night-coverage [email or web address]  08/09/2023, 1:51 AM

## 2023-08-09 NOTE — Progress Notes (Signed)
  Echocardiogram 2D Echocardiogram has been performed.  Cindy Huerta 08/09/2023, 1:44 PM

## 2023-08-09 NOTE — Evaluation (Signed)
Occupational Therapy Evaluation Patient Details Name: Cindy Huerta MRN: [id number] DOB: May 31, 1946 Today's Date: 08/09/2023   History of Present Illness Pt is a 77 y/o female admitted 08/08/23 for intractable nausea and vomiting as well as syncopal episode. PMH includes Arthritis, Breast cancer, Depression, HLD, HTN, and Hypothyroidism; Cholecystectomy; Abdominal hysterectomy (1982); Total mastectomy (Left, 11/20/2021), CKD stage III, history of GI bleed, TIA, and chronic HFpEF .   Clinical Impression   Pt is unreliable historian at this time, per chart review and previous admissions, Pt was ambulatory with RW and mod I for ADL - Son/family perform IADL for patient.  Pt overall min guard up to mod A for seated UB ADL and mod to max A for LB ADL at this time. Pt was able to transfer to [facility] with OT and NT at min/mod A. Pt stating that she was lightheaded, dizzy and felt like she was going to pass out (she did not). BP was 155/72 HR 102 sitting on BSC; 157/79 HR 103 sitting on EOB, 160/70 HR 92 after return supine. At this time recommending skilled OT in the acute setting as well as afterwards at the post-acute rehab <3 hours daily to maximize safety and independence in ADL and functional transfers unless her son feels like he can take her home and provide 24/7 supervision (he was not in the room to confirm or discuss dc options at the time of OT eval).       If plan is discharge home, recommend the following: A lot of help with walking and/or transfers;A lot of help with bathing/dressing/bathroom;Assist for transportation;Help with stairs or ramp for entrance;Supervision due to cognitive status    Functional Status Assessment  Patient has had a recent decline in their functional status and demonstrates the ability to make significant improvements in function in a reasonable and predictable amount of time.  Equipment Recommendations  None recommended by OT (defer to next venue - confirm with family  needs)    Recommendations for Other Services PT consult;Speech consult     Precautions / Restrictions Precautions Precautions: Fall Restrictions Weight Bearing Restrictions: No      Mobility Bed Mobility Overal bed mobility: Needs Assistance Bed Mobility: Supine to Sit, Sit to Supine     Supine to sit: Mod assist, HOB elevated, Used rails Sit to supine: Mod assist, +2 for safety/equipment   General bed mobility comments: assist for trunk elevation and use of bed pad to bring hips EOB, helicopter technique use to return supine    Transfers Overall transfer level: Needs assistance Equipment used: 1 person hand held assist, 2 person hand held assist Transfers: Sit to/from Stand, Bed to chair/wheelchair/BSC Sit to Stand: Min assist, +2 safety/equipment     Step pivot transfers: Mod assist, +2 safety/equipment     General transfer comment: Pt complaining about feeling light headed and dizzy with transfers, anxious. Pt also getting DOE but SpO2 WFL. Multimodal cues for sequencing transfer safely, no use of DME due to symptomatic      Balance Overall balance assessment: Needs assistance Sitting-balance support: Single extremity supported, Bilateral upper extremity supported, Feet supported Sitting balance-Leahy Scale: Poor Sitting balance - Comments: initially requiring mod A, progressed to close min guard   Standing balance support: Bilateral upper extremity supported, Reliant on assistive device for balance (external support) Standing balance-Leahy Scale: Poor  ADL either performed or assessed with clinical judgement   ADL Overall ADL's : Needs assistance/impaired Eating/Feeding: Set up;Sitting   Grooming: Wash/dry face;Minimal assistance;Cueing for sequencing;Sitting Grooming Details (indicate cue type and reason): sitting EOB Upper Body Bathing: Moderate assistance   Lower Body Bathing: Maximal assistance Lower Body Bathing  Details (indicate cue type and reason): knees down Upper Body Dressing : Minimal assistance;Sitting   Lower Body Dressing: Maximal assistance;Sit to/from stand   Toilet Transfer: Moderate assistance;Stand-pivot;BSC/3in1 Toilet Transfer Details (indicate cue type and reason): 1 person face to face multimodal cues Toileting- Clothing Manipulation and Hygiene: Moderate assistance;Sit to/from stand       Functional mobility during ADLs: Moderate assistance;+2 for safety/equipment;Rolling walker (2 wheels);Cueing for safety;Cueing for sequencing General ADL Comments: cognition impacting ADL performance as well as symptoms for syncopy     Vision   Vision Assessment?: No apparent visual deficits Additional Comments: continue to assess as cognition improves     Perception         Praxis         Pertinent Vitals/Pain Pain Assessment Pain Assessment: No/denies pain     Extremity/Trunk Assessment Upper Extremity Assessment Upper Extremity Assessment: Right hand dominant;LUE deficits/detail;Difficult to assess due to impaired cognition;Generalized weakness LUE Deficits / Details: Pt reports decreased ROM and strength due to L mastectomy. cog impaired and impacting LUE eval LUE Coordination: decreased gross motor   Lower Extremity Assessment Lower Extremity Assessment: Defer to PT evaluation       Communication Communication Communication: Difficulty following commands/understanding;Difficulty communicating thoughts/reduced clarity of speech Following commands: Follows one step commands with increased time;Follows one step commands consistently Cueing Techniques: Verbal cues;Gestural cues;Tactile cues;Visual cues   Cognition Arousal: Alert, Stuporous Behavior During Therapy: Anxious Overall Cognitive Status: No family/caregiver present to determine baseline cognitive functioning Area of Impairment: Following commands, Safety/judgement, Awareness, Problem solving, Attention                    Current Attention Level: Sustained   Following Commands: Follows one step commands consistently, Follows one step commands with increased time Safety/Judgement: Decreased awareness of safety, Decreased awareness of deficits Awareness: Emergent Problem Solving: Slow processing, Decreased initiation, Difficulty sequencing, Requires verbal cues, Requires tactile cues General Comments: Pt generally cooperative and pleasant, tangential in conversation, requires multimodal cues for sequencing movement and tasks, Pt anxious with positional changes     General Comments  BP were 155/72 HR 102 sitting on BSC; 157/79 HR 103 sitting on EOB, 160/70 HR 92 supine    Exercises     Shoulder Instructions      Home Living Family/patient expects to be discharged to:: Private residence Living Arrangements: Children (son) Available Help at Discharge: Family;Available 24 hours/day Type of Home: House Home Access: Stairs to enter [employer] of Steps: 2 Entrance Stairs-Rails: None Home Layout: Two level;Bed/bath upstairs Alternate Level Stairs-Number of Steps: 1/2 flight +landing+ 1/2 flight Alternate Level Stairs-Rails: Right;Left;Can reach both Bathroom Shower/Tub: Producer, television/film/video: Standard     Home Equipment: Agricultural consultant (2 wheels);Cane - quad;Shower seat   Additional Comments: Pt unreliable historian and home set up and PLOF from previous admission.      Prior Functioning/Environment Prior Level of Function : Independent/Modified Independent;History of Falls (last six months);Patient poor historian/Family not available             Mobility Comments: RW for mobility, here for fall ADLs Comments: son assists with IADL, mod I with DME according to chart review  OT Problem List: Decreased strength;Decreased activity tolerance;Impaired balance (sitting and/or standing);Decreased safety awareness;Obesity      OT  Treatment/Interventions: Self-care/ADL training;Energy conservation;DME and/or AE instruction;Therapeutic activities;Cognitive remediation/compensation;Patient/family education;Balance training    OT Goals(Current goals can be found in the care plan section) Acute Rehab OT Goals Patient Stated Goal: feel better OT Goal Formulation: With patient Time For Goal Achievement: 08/23/23 Potential to Achieve Goals: Good ADL Goals Pt Will Perform Grooming: with modified independence;sitting Pt Will Perform Upper Body Dressing: with modified independence;sitting Pt Will Perform Lower Body Dressing: with supervision;sit to/from stand Pt Will Transfer to Toilet: with contact guard assist;ambulating Pt Will Perform Toileting - Clothing Manipulation and hygiene: with supervision;sit to/from stand Additional ADL Goal #1: Pt will perform bed mobility at supervision level prior to engaging in ADL  OT Frequency: Min 1X/week    Co-evaluation              AM-PAC OT "6 Clicks" Daily Activity     Outcome Measure Help from another person eating meals?: A Little Help from another person taking care of personal grooming?: A Little Help from another person toileting, which includes using toliet, bedpan, or urinal?: A Lot Help from another person bathing (including washing, rinsing, drying)?: A Lot Help from another person to put on and taking off regular upper body clothing?: A Little Help from another person to put on and taking off regular lower body clothing?: A Lot 6 Click Score: 15   End of Session Equipment Utilized During Treatment: Gait belt Nurse Communication: Mobility status;Precautions  Activity Tolerance: Treatment limited secondary to medical complications (Comment) (symptomatic for signs that lead to syncopy) Patient left: in bed;with call bell/phone within reach;with nursing/sitter in room;with bed alarm set  OT Visit Diagnosis: Unsteadiness on feet (R26.81);Repeated falls (R29.6);Muscle  weakness (generalized) (M62.81);History of falling (Z91.81);Other symptoms and signs involving cognitive function;Dizziness and giddiness (R42)                Time: 1610-9604 OT Time Calculation (min): 22 min Charges:  OT General Charges $OT Visit: 1 Visit OT Evaluation $OT Eval Moderate Complexity: 1 Mod  Nyoka Cowden OTR/L Acute Rehabilitation Services Office: [phone]  Emelda Fear 08/09/2023, 2:34 PM

## 2023-08-09 NOTE — Plan of Care (Signed)
  Problem: Education: Goal: Knowledge of General Education information will improve Description: Including pain rating scale, medication(s)/side effects and non-pharmacologic comfort measures Outcome: Progressing   Problem: Clinical Measurements: Goal: Diagnostic test results will improve Outcome: Progressing   Problem: Nutrition: Goal: Adequate nutrition will be maintained Outcome: Progressing   Problem: Safety: Goal: Ability to remain free from injury will improve Outcome: Progressing

## 2023-08-09 NOTE — Progress Notes (Signed)
NEW ADMISSION NOTE   Arrival Method: ED stretcher Mental Orientation: AAOx3 Telemetry: 5M04 Assessment: Completed Skin: See flowsheet IV: RFA Pain: 0/10 Tubes: n/a Safety Measures: Safety Fall Prevention Plan has been given, discussed and signed Admission: Completed [facility] Orientation: Patient has been orientated to the room, unit and staff.  Family: none at bedside   Orders have been reviewed and implemented. Will continue to monitor the patient. Call light has been placed within reach and bed alarm has been activated.

## 2023-08-09 NOTE — Progress Notes (Signed)
   08/09/23 0100  Spiritual Encounters  Type of Visit Initial  Care provided to: Pt not available  Referral source Trauma page  Reason for visit Trauma  OnCall Visit No   Chaplain responded to a level tro trauma. The patient was attended to by the medical team.  No family is present. If a chaplain is requested someone will respond.   Valerie Roys [facility]  [phone]

## 2023-08-09 NOTE — Progress Notes (Signed)
PT Cancellation Note  Patient Details Name: Cindy Huerta MRN: [id number] DOB: 10-13-1946   Cancelled Treatment:    Reason Eval/Treat Not Completed: Fatigue/lethargy limiting ability to participate Patient sleeping soundly upon Eval attempt. Will re-attempt at later date.    Pamela Maddy 08/09/2023, 3:11 PM

## 2023-08-09 NOTE — ED Notes (Signed)
ED TO INPATIENT HANDOFF REPORT  ED Nurse Name and Phone #:[phone]  S Name/Age/Gender Cindy Huerta 77 y.o. female Room/Bed: 033C/033C  Code Status   Code Status: Prior  Home/SNF/Other Home Patient oriented to: self and place Is this baseline? Yes   Triage Complete: Triage complete  Chief Complaint AKI (acute kidney injury) (HCC) [N17.9]  Triage Note Pt bib [facility] coming from home w/ c/o syncopal episode. Called out by family, since Thursday n/v/d and weakness, decreased oral intake. Any time she tries to sit up or stand, she goes unconscious. Pt went unconscious 3x during EMS transport. States 70/30 and hr of 160 when trying to sit up. Dementia, at baseline. 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 Pt has hx of GERD, HTN, hyperlipemia. EMS started pt on LR. Pt alert and oriented x3.  EMS vital signs:  Cbg 160 178/100 ;laying flat, 110 HR  100% on RA etCO2 17 20 right foot    Allergies Allergies  Allergen Reactions   Latex Rash   Novocain [Procaine] Other (See Comments)    Unsure - told by DDS not to let anyone give it to her  Confusion    Zestril [Lisinopril] Cough   Benadryl [Diphenhydramine] Other (See Comments)    Jitteriness Agitation   Biaxin [Clarithromycin] Other (See Comments)    Confusion     Roxicodone [Oxycodone] Nausea And Vomiting and Anxiety    Level of Care/Admitting Diagnosis ED Disposition     ED Disposition  Admit   Condition  --   Comment  Hospital Area: [facility] [[id number]]  Level of Care: Telemetry Medical [104]  May place patient in observation at [facility] or [facility] if equivalent level of care is available:: Yes  Covid Evaluation: Asymptomatic - no recent exposure (last 10 days) testing not required  Diagnosis: AKI (acute kidney injury) [facility]) [[id number]]  Admitting Physician: John Giovanni [[id number]]  Attending Physician: John Giovanni [[id number]]          B Medical/Surgery History Past  Medical History:  Diagnosis Date   Arthritis    Breast cancer (HCC)    Depression    GERD (gastroesophageal reflux disease)    HLD (hyperlipidemia)    Hypertension    Hypothyroidism    Past Surgical History:  Procedure Laterality Date   ABDOMINAL HYSTERECTOMY  1982   AXILLARY SENTINEL NODE BIOPSY Left 11/20/2021   Procedure: AXILLARY SENTINEL NODE BIOPSY;  Surgeon: Sung Amabile, DO;  Location: [facility] ORS;  Service: General;  Laterality: Left;   BREAST BIOPSY Left 09/23/2021   3:00 13cmfn venus marker, path pending   BREAST BIOPSY Left 09/23/2021   3:00 14 cmfn heart marker, path pending   CHOLECYSTECTOMY     ESOPHAGOGASTRODUODENOSCOPY (EGD) WITH PROPOFOL N/A 10/08/2021   Procedure: ESOPHAGOGASTRODUODENOSCOPY (EGD) WITH PROPOFOL;  Surgeon: Wyline Mood, MD;  Location: [facility] ENDOSCOPY;  Service: Gastroenterology;  Laterality: N/A;   HEMATOMA EVACUATION Left 11/21/2021   Procedure: EVACUATION HEMATOMA;  Surgeon: Sung Amabile, DO;  Location: [facility] ORS;  Service: General;  Laterality: Left;   IR FLUORO GUIDE CV LINE LEFT  08/17/2022   IR US GUIDE VASC ACCESS LEFT  08/17/2022   IR US GUIDE VASC ACCESS RIGHT  08/17/2022   left lumpectomy  2019   TOTAL MASTECTOMY Left 11/20/2021   Procedure: TOTAL MASTECTOMY;  Surgeon: Sung Amabile, DO;  Location: [facility] ORS;  Service: General;  Laterality: Left;     A IV Location/Drains/Wounds Patient Lines/Drains/Airways Status     Active Line/Drains/Airways  Name Placement date Placement time Site Days   Peripheral IV 08/08/23 20 G Anterior;Right Foot 08/08/23  2136  Foot  1   Peripheral IV 08/08/23 22 G 1.75" Anterior;Right Forearm 08/08/23  2238  Forearm  1   External Urinary Catheter 08/17/22  0553  --  357            Intake/Output Last 24 hours No intake or output data in the 24 hours ending 08/09/23 0224  Labs/Imaging Results for orders placed or performed during the hospital encounter of 08/08/23 (from the past 48 hour(s))   Comprehensive metabolic panel     Status: Abnormal   Collection Time: 08/08/23 10:27 PM  Result Value Ref Range   Sodium 131 (L) 135 - 145 mmol/L   Potassium 2.3 (LL) 3.5 - 5.1 mmol/L    Comment: CRITICAL RESULT CALLED TO, READ BACK BY AND VERIFIED WITH Naleah Kofoed, W. RN @ 2325 08/08/23 JBUTLER   Chloride 89 (L) 98 - 111 mmol/L   CO2 22 22 - 32 mmol/L   Glucose, Bld 79 70 - 99 mg/dL    Comment: Glucose reference range applies only to samples taken after fasting for at least 8 hours.   BUN 52 (H) 8 - 23 mg/dL   Creatinine, Ser 4.09 (H) 0.44 - 1.00 mg/dL   Calcium 9.1 8.9 - 81.1 mg/dL   Total Protein 8.1 6.5 - 8.1 g/dL   Albumin 4.1 3.5 - 5.0 g/dL   AST 53 (H) 15 - 41 U/L   ALT 39 0 - 44 U/L   Alkaline Phosphatase 55 38 - 126 U/L   Total Bilirubin 1.2 0.3 - 1.2 mg/dL   GFR, Estimated 14 (L) >60 mL/min    Comment: (NOTE) Calculated using the CKD-EPI Creatinine Equation (2021)    Anion gap 20 (H) 5 - 15    Comment: Performed at [facility], [address], [place], [place] [address]  Lipase, blood     Status: None   Collection Time: 08/08/23 10:27 PM  Result Value Ref Range   Lipase 33 11 - 51 U/L    Comment: Performed at [facility], [address] [place], [place] [address]  CBC with Differential     Status: Abnormal   Collection Time: 08/08/23 10:27 PM  Result Value Ref Range   WBC 16.7 (H) 4.0 - 10.5 K/uL   RBC 4.38 3.87 - 5.11 MIL/uL   Hemoglobin 13.8 12.0 - 15.0 g/dL   HCT 13.0 86.5 - 78.4 %   MCV 88.1 80.0 - 100.0 fL   MCH 31.5 26.0 - 34.0 pg   MCHC 35.8 30.0 - 36.0 g/dL   RDW 69.6 29.5 - 28.4 %   Platelets 270 150 - 400 K/uL   nRBC 0.0 0.0 - 0.2 %   Neutrophils Relative % 75 %   Neutro Abs 12.4 (H) 1.7 - 7.7 K/uL   Lymphocytes Relative 15 %   Lymphs Abs 2.6 0.7 - 4.0 K/uL   Monocytes Relative 9 %   Monocytes Absolute 1.5 (H) 0.1 - 1.0 K/uL   Eosinophils Relative 0 %   Eosinophils Absolute 0.1 0.0 - 0.5 K/uL   Basophils Relative 0 %   Basophils  Absolute 0.1 0.0 - 0.1 K/uL   Immature Granulocytes 1 %   Abs Immature Granulocytes 0.16 (H) 0.00 - 0.07 K/uL    Comment: Performed at [facility], [address], [place], [place] [address]  Ammonia     Status:  None   Collection Time: 08/08/23 10:27 PM  Result Value Ref Range   Ammonia <10 9 - 35 umol/L    Comment: Performed at [facility], [address] [place], [place] [address]  TSH     Status: None   Collection Time: 08/08/23 10:27 PM  Result Value Ref Range   TSH 3.215 0.350 - 4.500 uIU/mL    Comment: Performed by a 3rd Generation assay with a functional sensitivity of <=0.01 uIU/mL. Performed at [facility], [address], [place], [place] [address]   Troponin I (High Sensitivity)     Status: Abnormal   Collection Time: 08/08/23 10:27 PM  Result Value Ref Range   Troponin I (High Sensitivity) 90 (H) <18 ng/L    Comment: (NOTE) Elevated high sensitivity troponin I (hsTnI) values and significant  changes across serial measurements may suggest ACS but many other  chronic and acute conditions are known to elevate hsTnI results.  Refer to the "Links" section for chest pain algorithms and additional  guidance. Performed at [facility], [address], [place], [place] [address]   Magnesium     Status: Abnormal   Collection Time: 08/08/23 10:27 PM  Result Value Ref Range   Magnesium 1.3 (L) 1.7 - 2.4 mg/dL    Comment: Performed at [facility], [address], [place], [place] [address]  Resp panel by RT-PCR (RSV, Flu A&B, Covid) Anterior Nasal Swab     Status: None   Collection Time: 08/09/23 12:24 AM   Specimen: Anterior Nasal Swab  Result Value Ref Range   SARS Coronavirus 2 by RT PCR NEGATIVE NEGATIVE   Influenza A by PCR NEGATIVE NEGATIVE   Influenza B by PCR NEGATIVE NEGATIVE    Comment: (NOTE) The Xpert Xpress SARS-CoV-2/FLU/RSV plus assay is intended as an aid in the diagnosis of influenza from Nasopharyngeal swab specimens  and should not be used as a sole basis for treatment. Nasal washings and aspirates are unacceptable for Xpert Xpress SARS-CoV-2/FLU/RSV testing.  Fact Sheet for Patients: BloggerCourse.com  Fact Sheet for Healthcare Providers: SeriousBroker.it  This test is not yet approved or cleared by the [place] FDA and has been authorized for detection and/or diagnosis of SARS-CoV-2 by FDA under an Emergency Use Authorization (EUA). This EUA will remain in effect (meaning this test can be used) for the duration of the COVID-19 declaration under Section 564(b)(1) of the Act, 21 U.S.C. section 360bbb-3(b)(1), unless the authorization is terminated or revoked.     Resp Syncytial Virus by PCR NEGATIVE NEGATIVE    Comment: (NOTE) Fact Sheet for Patients: BloggerCourse.com  Fact Sheet for Healthcare Providers: SeriousBroker.it  This test is not yet approved or cleared by the [place] FDA and has been authorized for detection and/or diagnosis of SARS-CoV-2 by FDA under an Emergency Use Authorization (EUA). This EUA will remain in effect (meaning this test can be used) for the duration of the COVID-19 declaration under Section 564(b)(1) of the Act, 21 U.S.C. section 360bbb-3(b)(1), unless the authorization is terminated or revoked.  Performed at [facility], [address], [place], [place] [address]   Troponin I (High Sensitivity)     Status: Abnormal   Collection Time: 08/09/23 12:44 AM  Result Value Ref Range   Troponin I (High Sensitivity) 93 (H) <18 ng/L    Comment: (NOTE) Elevated high sensitivity troponin I (hsTnI) values and significant  changes across serial measurements may suggest ACS but many other  chronic and acute conditions are known to elevate hsTnI results.  Refer to the "Links" section for chest pain algorithms and additional  guidance. Performed at [facility], [address] [place], [place] [address]    CT ABDOMEN PELVIS WO CONTRAST  Result Date: 08/09/2023 CLINICAL DATA:  Abdominal pain and vomiting. EXAM: CT ABDOMEN AND PELVIS WITHOUT CONTRAST TECHNIQUE: Multidetector CT imaging of the abdomen and pelvis was performed following the standard protocol without IV contrast. RADIATION DOSE REDUCTION: This exam was performed according to the departmental dose-optimization program which includes automated exposure control, adjustment of the mA and/or kV according to patient size and/or use of iterative reconstruction technique. COMPARISON:  CTA chest, abdomen and pelvis 07/27/2022, CT abdomen and pelvis with IV contrast 06/05/2021. FINDINGS: Lower chest: Small chronic hiatal hernia. The cardiac size is normal. Visualized lung bases are clear. The right hemidiaphragm again mildly elevated with the dome of the right hemidiaphragm excluded from the study. Hepatobiliary: The dome of the liver was partially excluded from the study. The visualized liver demonstrating no appreciable mass without contrast. Gallbladder again noted absent without biliary dilatation. Streak artifact from the patient's arms in the field limits fine detail. Pancreas: There is suspected stranding around the pancreatic head, uncinate process and neck although the finding could be exaggerated due to artifact from the patient's arms. Correlate with serum lipase for possible acute pancreatitis. The pancreas is partially atrophic and otherwise unremarkable. There is no appreciable ductal dilatation. Spleen: Unremarkable without contrast and allowing for superimposed streak artifacts. Adrenals/Urinary Tract: There is no adrenal mass. There is no contour deforming abnormality of either kidney. There is no urinary stone or obstruction. The bladder thickness is normal. Stomach/Bowel: No dilatation or wall thickening. An appendix is not seen in this patient. There is fluid in the ascending colon.  There are colonic diverticula most numerous along the descending and sigmoid segments but no findings of acute diverticulitis. Vascular/Lymphatic: Aortic atherosclerosis. No enlarged abdominal or pelvic lymph nodes. Reproductive: Status post hysterectomy. No adnexal masses. Other: No abdominal wall hernia or abnormality. No abdominopelvic ascites. There is no peripancreatic localizing fluid collection, free hemorrhage or free air. Musculoskeletal: There is osteopenia with degenerative changes of the spine, hips and SI joints. No acute or suspicious osseous findings. IMPRESSION: 1. Streak artifact from the patient's arms in the field limits fine detail. 2. There appears to be stranding around the pancreatic head, uncinate process and neck. Correlate with serum lipase for possible acute pancreatitis. No peripancreatic fluid or hemorrhage is seen. 3. Fluid in the ascending colon. No bowel wall thickening or dilatation. 4. Diverticulosis without evidence of diverticulitis. 5. Aortic atherosclerosis. 6. Small hiatal hernia. 7. Osteopenia and degenerative change. Aortic Atherosclerosis (ICD10-I70.0). Electronically Signed   By: Almira Bar M.D.   On: 08/09/2023 00:49   CT Head Wo Contrast  Result Date: 08/09/2023 CLINICAL DATA:  Vomiting and syncopal episode. Altered mental status EXAM: CT HEAD WITHOUT CONTRAST TECHNIQUE: Contiguous axial images were obtained from the base of the skull through the vertex without intravenous contrast. RADIATION DOSE REDUCTION: This exam was performed according to the departmental dose-optimization program which includes automated exposure control, adjustment of the mA and/or kV according to patient size and/or use of iterative reconstruction technique. COMPARISON:  CT head 08/17/2022 FINDINGS: Brain: No intracranial hemorrhage, mass effect, or evidence of acute infarct. No hydrocephalus. No extra-axial fluid collection. Age-commensurate cerebral atrophy and ill-defined  hypoattenuation within the cerebral white matter consistent with chronic small vessel ischemic disease.  Vascular: No hyperdense vessel. Intracranial arterial calcification. Skull: No fracture or focal lesion. Sinuses/Orbits: No acute finding. Other: None. IMPRESSION: 1. No acute intracranial abnormality. Electronically Signed   By: Minerva Fester M.D.   On: 08/09/2023 00:37    Pending Labs Unresulted Labs (From admission, onward)     Start     Ordered   08/08/23 2158  Culture, blood (routine x 2)  BLOOD CULTURE X 2,   R (with STAT occurrences)      08/08/23 2159   08/08/23 2158  Urinalysis, w/ Reflex to Culture (Infection Suspected) -Urine, Clean Catch  Once,   URGENT       Question:  Specimen Source  Answer:  Urine, Clean Catch   08/08/23 2159            Vitals/Pain Today's Vitals   08/08/23 2133 08/09/23 0052 08/09/23 0100 08/09/23 0115  BP: (!) 178/87  (!) 171/71 (!) 164/68  Pulse: 100  87 89  Resp: (!) 22  16 15   Temp: 99.1 F (37.3 C)   98.9 F (37.2 C)  TempSrc: Oral     SpO2: 100%  97% 98%  PainSc:  Asleep      Isolation Precautions No active isolations  Medications Medications  potassium chloride 10 mEq in 100 mL IVPB (10 mEq Intravenous New Bag/Given 08/09/23 0202)  magnesium sulfate IVPB 4 g 100 mL (has no administration in time range)  lactated ringers bolus 1,000 mL (has no administration in time range)  ondansetron (ZOFRAN) injection 4 mg (4 mg Intravenous Given 08/08/23 2339)  sodium chloride 0.9 % bolus 1,000 mL (0 mLs Intravenous Stopped 08/09/23 0201)  morphine (PF) 4 MG/ML injection 4 mg (4 mg Intravenous Given 08/08/23 2341)    Mobility wheelchair     Focused Assessments     R Recommendations: See Admitting Provider Note  Report given to:   Additional Notes: N/A

## 2023-08-10 DIAGNOSIS — R112 Nausea with vomiting, unspecified: Secondary | ICD-10-CM | POA: Diagnosis not present

## 2023-08-10 LAB — BASIC METABOLIC PANEL
Anion gap: 10 (ref 5–15)
BUN: 27 mg/dL — ABNORMAL HIGH (ref 8–23)
CO2: 24 mmol/L (ref 22–32)
Calcium: 7.9 mg/dL — ABNORMAL LOW (ref 8.9–10.3)
Chloride: 99 mmol/L (ref 98–111)
Creatinine, Ser: 1.38 mg/dL — ABNORMAL HIGH (ref 0.44–1.00)
GFR, Estimated: 39 mL/min — ABNORMAL LOW (ref >60)
Glucose, Bld: 98 mg/dL (ref 70–99)
Potassium: 3.5 mmol/L (ref 3.5–5.1)
Sodium: 133 mmol/L — ABNORMAL LOW (ref 135–145)

## 2023-08-10 LAB — PHOSPHORUS: Phosphorus: 2 mg/dL — ABNORMAL LOW (ref 2.5–4.6)

## 2023-08-10 LAB — MAGNESIUM: Magnesium: 2.9 mg/dL — ABNORMAL HIGH (ref 1.7–2.4)

## 2023-08-10 MED ORDER — POTASSIUM PHOSPHATES 15 MMOLE/5ML IV SOLN
30.0000 mmol | Freq: Once | INTRAVENOUS | Status: AC
  Administered 2023-08-10: 30 mmol via INTRAVENOUS
  Filled 2023-08-10: qty 10

## 2023-08-10 NOTE — Progress Notes (Signed)
PROGRESS NOTE    VINETA BASINGER  AOZ:[id number] DOB: [birth year] DOA: 08/08/2023 PCP: Dorothey Baseman, MD     Brief Narrative:  77 year old with history of HTN, HLD, hypothyroidism, GERD, CKD stage III, GI bleed, TIA, CHF with preserved EF, depression, breast cancer, cholecystectomy comes to the hospital with persistent vomiting, syncope and confusion.  With EMS patient was hypotensive and syncopized multiple times.  Lab work showed hypokalemia, acute kidney injury.  CT head was negative.  CT abdomen pelvis showed possible acute pancreatitis.     Assessment & Plan:  Principal Problem:   Intractable nausea and vomiting Active Problems:   Hyponatremia   Chronic diastolic CHF (congestive heart failure) (HCC)   Hypothyroidism   Hypokalemia   Benign essential hypertension   AKI (acute kidney injury) (HCC)   Syncope   Hypomagnesemia   QT prolongation   Acute metabolic encephalopathy   Elevated troponin    Intractable nausea and vomiting Possible acute mild pancreatitis as seen on CT scan but lipase levels are negative.  No other abnormality seen on CT abdomen pelvis.  LFTs are normal.  UA is pending.  IV fluids, supportive care.  Antiemetics as needed, diet as tolerated.     AKI on CKD stage III Baseline creatinine 1.2, admission creatinine 3.3, slowly improving   Syncope Likely due to severe dehydration/orthostatic hypotension.  PE less likely given no chest pain or hypoxia.  Patient is currently not tachycardic or tachypneic.  No seizure-like activity reported by family.  Echo shows preserved EF with mild LVH and RVH.   Hypokalemia/hypomagnesemia/hypophosphatemia Prolonged QTc Monitor and replete electrolytes   Mild hyponatremia Noted with fluids   Acute metabolic encephalopathy Mildly confused but overall answers most of the question.  Ammonia, TSH, B12 are normal.  No focal neurodeficits.  UA still pending.   Elevated troponin and EKG changes, no chest pain EKG showing  new ST depressions/T wave inversions inferolaterally compared to previous EKG from a year ago.  Echo shows preserved EF   Chronic HFpEF Echo done 2 years ago showing EF 55 to 60%, grade 2 diastolic dysfunction, normal RV function, trivial MVR.  No obvious signs of volume overload.  BNP is normal   Hypertension Resume Norvasc, Toprol and clonidine   Hyperlipidemia -Lipitor  Hypothyroidism: TSH normal.  GERD -Protonix  History of TIA -aspirin and statin  Depression -Prozac         DVT prophylaxis: heparin  Code Status: DNR Family Communication: Son [name] updated Continue hospital stay for ongoing evaluation for multiple electrolyte abnormality, PT/OT        Subjective: Seen at bedside, still feeling little weak but improving   Examination:  General exam: Appears calm and comfortable, elderly frail. Respiratory system: Clear to auscultation. Respiratory effort normal. Cardiovascular system: S1 & S2 heard, RRR. No JVD, murmurs, rubs, gallops or clicks. No pedal edema. Gastrointestinal system: Abdomen is nondistended, soft and nontender. No organomegaly or masses felt. Normal bowel sounds heard. Central nervous system: Alert and oriented. No focal neurological deficits.  Homero Fellers full Extremities: Symmetric 5 x 5 power. Skin: No rashes, lesions or ulcers Psychiatry: Judgement and insight appear normal. Mood & affect appropriate.       Diet Orders (From admission, onward)     Start     Ordered   08/09/23 0322  Diet Heart Room service appropriate? Yes; Fluid consistency: Thin  Diet effective now       Question Answer Comment  Room service appropriate? Yes   Fluid consistency: Thin  08/09/23 0325            Objective: Vitals:   08/09/23 1521 08/09/23 2016 08/10/23 0618 08/10/23 0904  BP: 134/63 (!) 116/57 138/66 (!) 145/62  Pulse: 73 64 63 61  Resp: 19  18 18   Temp: 98.1 F (36.7 C) 97.8 F (36.6 C) 98 F (36.7 C) 98.5 F (36.9 C)  TempSrc:  Oral Oral    SpO2: 97% 100% 99% 100%  Weight:      Height:        Intake/Output Summary (Last 24 hours) at 08/10/2023 1200 Last data filed at 08/10/2023 0616 Gross per 24 hour  Intake 230.37 ml  Output 1800 ml  Net -1569.63 ml   Filed Weights   08/09/23 0335  Weight: 67 kg    Scheduled Meds:  amLODipine  10 mg Oral Daily   aspirin EC  81 mg Oral Daily   atorvastatin  80 mg Oral Daily   cloNIDine  0.1 mg Oral BID   FLUoxetine  20 mg Oral Daily   heparin  5,000 Units Subcutaneous Q8H   levothyroxine  75 mcg Oral Q0600   metoprolol succinate  50 mg Oral BID   pantoprazole  40 mg Oral Daily   Continuous Infusions:  potassium PHOSPHATE IVPB (in mmol)      Nutritional status     Body mass index is 23.84 kg/m.  Data Reviewed:   CBC: Recent Labs  Lab 08/08/23 2227 08/09/23 0615 08/09/23 1017  WBC 16.7* 14.3* 13.7*  NEUTROABS 12.4*  --   --   HGB 13.8 11.7* 12.8  HCT 38.6 34.4* 39.0  MCV 88.1 89.6 93.1  PLT 270 207 188   Basic Metabolic Panel: Recent Labs  Lab 08/08/23 2227 08/09/23 0615 08/09/23 1017 08/10/23 0631  NA 131* 131*  --  133*  K 2.3* 3.1*  --  3.5  CL 89* 98  --  99  CO2 22 20*  --  24  GLUCOSE 79 89  --  98  BUN 52* 46*  --  27*  CREATININE 3.33* 2.73*  --  1.38*  CALCIUM 9.1 7.7*  --  7.9*  MG 1.3* 1.2*  --  2.9*  PHOS  --   --  2.9 2.0*   GFR: Estimated Creatinine Clearance: 30.9 mL/min (A) (by C-G formula based on SCr of 1.38 mg/dL (H)). Liver Function Tests: Recent Labs  Lab 08/08/23 2227 08/09/23 0615  AST 53* 38  ALT 39 28  ALKPHOS 55 42  BILITOT 1.2 1.1  PROT 8.1 6.3*  ALBUMIN 4.1 3.2*   Recent Labs  Lab 08/08/23 2227  LIPASE 33   Recent Labs  Lab 08/08/23 2227  AMMONIA <10   Coagulation Profile: No results for input(s): "INR", "PROTIME" in the last 168 hours. Cardiac Enzymes: No results for input(s): "CKTOTAL", "CKMB", "CKMBINDEX", "TROPONINI" in the last 168 hours. BNP (last 3 results) No results for  input(s): "PROBNP" in the last 8760 hours. HbA1C: No results for input(s): "HGBA1C" in the last 72 hours. CBG: No results for input(s): "GLUCAP" in the last 168 hours. Lipid Profile: No results for input(s): "CHOL", "HDL", "LDLCALC", "TRIG", "CHOLHDL", "LDLDIRECT" in the last 72 hours. Thyroid Function Tests: Recent Labs    08/08/23 2227  TSH 3.215   Anemia Panel: Recent Labs    08/09/23 0615  VITAMINB12 873   Sepsis Labs: No results for input(s): "PROCALCITON", "LATICACIDVEN" in the last 168 hours.  Recent Results (from the past 240 hour(s))  Culture, blood (  routine x 2)     Status: None (Preliminary result)   Collection Time: 08/08/23 10:27 PM   Specimen: BLOOD  Result Value Ref Range Status   Specimen Description BLOOD SITE NOT SPECIFIED  Final   Special Requests   Final    BOTTLES DRAWN AEROBIC AND ANAEROBIC Blood Culture adequate volume   Culture   Final    NO GROWTH 2 DAYS Performed at [facility], [address] [place], [place] [address]    Report Status PENDING  Incomplete  Resp panel by RT-PCR (RSV, Flu A&B, Covid) Anterior Nasal Swab     Status: None   Collection Time: 08/09/23 12:24 AM   Specimen: Anterior Nasal Swab  Result Value Ref Range Status   SARS Coronavirus 2 by RT PCR NEGATIVE NEGATIVE Final   Influenza A by PCR NEGATIVE NEGATIVE Final   Influenza B by PCR NEGATIVE NEGATIVE Final    Comment: (NOTE) The Xpert Xpress SARS-CoV-2/FLU/RSV plus assay is intended as an aid in the diagnosis of influenza from Nasopharyngeal swab specimens and should not be used as a sole basis for treatment. Nasal washings and aspirates are unacceptable for Xpert Xpress SARS-CoV-2/FLU/RSV testing.  Fact Sheet for Patients: BloggerCourse.com  Fact Sheet for Healthcare Providers: SeriousBroker.it  This test is not yet approved or cleared by the [place] FDA and has been authorized for detection and/or  diagnosis of SARS-CoV-2 by FDA under an Emergency Use Authorization (EUA). This EUA will remain in effect (meaning this test can be used) for the duration of the COVID-19 declaration under Section 564(b)(1) of the Act, 21 U.S.C. section 360bbb-3(b)(1), unless the authorization is terminated or revoked.     Resp Syncytial Virus by PCR NEGATIVE NEGATIVE Final    Comment: (NOTE) Fact Sheet for Patients: BloggerCourse.com  Fact Sheet for Healthcare Providers: SeriousBroker.it  This test is not yet approved or cleared by the [place] FDA and has been authorized for detection and/or diagnosis of SARS-CoV-2 by FDA under an Emergency Use Authorization (EUA). This EUA will remain in effect (meaning this test can be used) for the duration of the COVID-19 declaration under Section 564(b)(1) of the Act, 21 U.S.C. section 360bbb-3(b)(1), unless the authorization is terminated or revoked.  Performed at [facility], [address] [place], [place] [address]   Culture, blood (routine x 2)     Status: None (Preliminary result)   Collection Time: 08/09/23  6:15 AM   Specimen: BLOOD  Result Value Ref Range Status   Specimen Description BLOOD BLOOD RIGHT HAND  Final   Special Requests   Final    AEROBIC BOTTLE ONLY Blood Culture results may not be optimal due to an inadequate volume of blood received in culture bottles   Culture   Final    NO GROWTH < 24 HOURS Performed at [facility], [address] [place], [place] [address]    Report Status PENDING  Incomplete         Radiology Studies: ECHOCARDIOGRAM COMPLETE  Result Date: 08/09/2023    ECHOCARDIOGRAM REPORT   Patient Name:   Mitali A Brisbane Date of Exam: 08/09/2023 Medical Rec #:  [id number]       Height:       66.0 in Accession #:    [id number]      Weight:       147.7 lb Date of Birth:  09/01/1946       BSA:  1.758 m Patient Age:    88 years        BP:            127/61 mmHg Patient Gender: F               HR:           98 bpm. Exam Location:  Inpatient Procedure: 2D Echo, 3D Echo, Cardiac Doppler, Color Doppler and Strain Analysis Indications:    R94.31 Abnormal EKG  History:        Patient has prior history of Echocardiogram examinations, most                 recent 07/29/2021. CHF, TIA, Signs/Symptoms:Syncope, Chest Pain                 and Dizziness/Lightheadedness; Risk Factors:Hypertension and                 Dyslipidemia. Breast cancer.  Sonographer:    Sheralyn Boatman RDCS Referring Phys: [id number] VASUNDHRA RATHORE IMPRESSIONS  1. Left ventricular ejection fraction, by estimation, is 60 to 65%. Left ventricular ejection fraction by 3D volume is 63 %. The left ventricle has normal function. The left ventricle has no regional wall motion abnormalities. There is mild concentric left ventricular hypertrophy. Indeterminate diastolic filling due to E-A fusion.  2. Right ventricular systolic function is normal. The right ventricular size is normal. Moderately increased right ventricular wall thickness. There is normal pulmonary artery systolic pressure. The estimated right ventricular systolic pressure is 31.7 mmHg.  3. Right atrial size was mildly dilated.  4. The mitral valve is degenerative. Trivial mitral valve regurgitation. No evidence of mitral stenosis.  5. The aortic valve is tricuspid. Aortic valve regurgitation is not visualized. No aortic stenosis is present.  6. The inferior vena cava is normal in size with greater than 50% respiratory variability, suggesting right atrial pressure of 3 mmHg. FINDINGS  Left Ventricle: Left ventricular ejection fraction, by estimation, is 60 to 65%. Left ventricular ejection fraction by 3D volume is 63 %. The left ventricle has normal function. The left ventricle has no regional wall motion abnormalities. Global longitudinal strain performed but not reported based on interpreter judgement due to suboptimal tracking. The left  ventricular internal cavity size was small. There is mild concentric left ventricular hypertrophy. Indeterminate diastolic filling due to E-A fusion. Right Ventricle: The right ventricular size is normal. Moderately increased right ventricular wall thickness. Right ventricular systolic function is normal. There is normal pulmonary artery systolic pressure. The tricuspid regurgitant velocity is 2.68 m/s, and with an assumed right atrial pressure of 3 mmHg, the estimated right ventricular systolic pressure is 31.7 mmHg. Left Atrium: Left atrial size was normal in size. Right Atrium: Right atrial size was mildly dilated. Pericardium: There is no evidence of pericardial effusion. Mitral Valve: The mitral valve is degenerative in appearance. Trivial mitral valve regurgitation. No evidence of mitral valve stenosis. MV peak gradient, 7.6 mmHg. The mean mitral valve gradient is 4.0 mmHg. Tricuspid Valve: The tricuspid valve is normal in structure. Tricuspid valve regurgitation is mild . No evidence of tricuspid stenosis. Aortic Valve: The aortic valve is tricuspid. Aortic valve regurgitation is not visualized. No aortic stenosis is present. Pulmonic Valve: The pulmonic valve was normal in structure. Pulmonic valve regurgitation is trivial. No evidence of pulmonic stenosis. Aorta: The aortic root and ascending aorta are structurally normal, with no evidence of dilitation. Venous: The inferior vena cava is normal in size with greater than  50% respiratory variability, suggesting right atrial pressure of 3 mmHg. IAS/Shunts: No atrial level shunt detected by color flow Doppler.  LEFT VENTRICLE PLAX 2D LVIDd:         3.50 cm         Diastology LVIDs:         2.40 cm         LV e' medial:    5.87 cm/s LV PW:         1.30 cm         LV E/e' medial:  13.5 LV IVS:        1.20 cm         LV e' lateral:   8.59 cm/s LVOT diam:     2.10 cm         LV E/e' lateral: 9.2 LV SV:         63 LV SV Index:   36 LVOT Area:     3.46 cm        3D  Volume EF                                LV 3D EF:    Left                                             ventricul LV Volumes (MOD)                            ar LV vol d, MOD    20.5 ml                    ejection A2C:                                        fraction LV vol d, MOD    50.9 ml                    by 3D A4C:                                        volume is LV vol s, MOD    18.8 ml                    63 %. A2C: LV vol s, MOD    14.2 ml A4C:                           3D Volume EF: LV SV MOD A2C:   1.7 ml        3D EF:        63 % LV SV MOD A4C:   50.9 ml       LV EDV:       90 ml LV SV MOD BP:    9.1 ml        LV ESV:       33 ml  LV SV:        57 ml RIGHT VENTRICLE             IVC RV S prime:     11.30 cm/s  IVC diam: 1.50 cm TAPSE (M-mode): 2.0 cm LEFT ATRIUM             Index        RIGHT ATRIUM           Index LA diam:        3.00 cm 1.71 cm/m   RA Area:     13.10 cm LA Vol (A2C):   36.3 ml 20.65 ml/m  RA Volume:   29.50 ml  16.78 ml/m LA Vol (A4C):   26.6 ml 15.13 ml/m LA Biplane Vol: 34.0 ml 19.34 ml/m  AORTIC VALVE             PULMONIC VALVE LVOT Vmax:   99.80 cm/s  PR End Diast Vel: 1.72 msec LVOT Vmean:  66.800 cm/s LVOT VTI:    0.183 m  AORTA Ao Root diam: 2.80 cm Ao Asc diam:  3.30 cm MITRAL VALVE                TRICUSPID VALVE MV Area (PHT): 4.68 cm     TR Peak grad:   28.7 mmHg MV Area VTI:   2.33 cm     TR Vmax:        268.00 cm/s MV Peak grad:  7.6 mmHg MV Mean grad:  4.0 mmHg     SHUNTS MV Vmax:       1.38 m/s     Systemic VTI:  0.18 m MV Vmean:      89.3 cm/s    Systemic Diam: 2.10 cm MV Decel Time: 162 msec MV E velocity: 79.10 cm/s MV A velocity: 128.00 cm/s MV E/A ratio:  0.62 Mihai Croitoru MD Electronically signed by Thurmon Fair MD Signature Date/Time: 08/09/2023/3:59:16 PM    Final    CT ABDOMEN PELVIS WO CONTRAST  Result Date: 08/09/2023 CLINICAL DATA:  Abdominal pain and vomiting. EXAM: CT ABDOMEN AND PELVIS WITHOUT CONTRAST TECHNIQUE:  Multidetector CT imaging of the abdomen and pelvis was performed following the standard protocol without IV contrast. RADIATION DOSE REDUCTION: This exam was performed according to the departmental dose-optimization program which includes automated exposure control, adjustment of the mA and/or kV according to patient size and/or use of iterative reconstruction technique. COMPARISON:  CTA chest, abdomen and pelvis 07/27/2022, CT abdomen and pelvis with IV contrast 06/05/2021. FINDINGS: Lower chest: Small chronic hiatal hernia. The cardiac size is normal. Visualized lung bases are clear. The right hemidiaphragm again mildly elevated with the dome of the right hemidiaphragm excluded from the study. Hepatobiliary: The dome of the liver was partially excluded from the study. The visualized liver demonstrating no appreciable mass without contrast. Gallbladder again noted absent without biliary dilatation. Streak artifact from the patient's arms in the field limits fine detail. Pancreas: There is suspected stranding around the pancreatic head, uncinate process and neck although the finding could be exaggerated due to artifact from the patient's arms. Correlate with serum lipase for possible acute pancreatitis. The pancreas is partially atrophic and otherwise unremarkable. There is no appreciable ductal dilatation. Spleen: Unremarkable without contrast and allowing for superimposed streak artifacts. Adrenals/Urinary Tract: There is no adrenal mass. There is no contour deforming abnormality of either kidney. There is no urinary stone or obstruction. The bladder thickness is normal. Stomach/Bowel: No dilatation or wall thickening. An  appendix is not seen in this patient. There is fluid in the ascending colon. There are colonic diverticula most numerous along the descending and sigmoid segments but no findings of acute diverticulitis. Vascular/Lymphatic: Aortic atherosclerosis. No enlarged abdominal or pelvic lymph nodes.  Reproductive: Status post hysterectomy. No adnexal masses. Other: No abdominal wall hernia or abnormality. No abdominopelvic ascites. There is no peripancreatic localizing fluid collection, free hemorrhage or free air. Musculoskeletal: There is osteopenia with degenerative changes of the spine, hips and SI joints. No acute or suspicious osseous findings. IMPRESSION: 1. Streak artifact from the patient's arms in the field limits fine detail. 2. There appears to be stranding around the pancreatic head, uncinate process and neck. Correlate with serum lipase for possible acute pancreatitis. No peripancreatic fluid or hemorrhage is seen. 3. Fluid in the ascending colon. No bowel wall thickening or dilatation. 4. Diverticulosis without evidence of diverticulitis. 5. Aortic atherosclerosis. 6. Small hiatal hernia. 7. Osteopenia and degenerative change. Aortic Atherosclerosis (ICD10-I70.0). Electronically Signed   By: Almira Bar M.D.   On: 08/09/2023 00:49   CT Head Wo Contrast  Result Date: 08/09/2023 CLINICAL DATA:  Vomiting and syncopal episode. Altered mental status EXAM: CT HEAD WITHOUT CONTRAST TECHNIQUE: Contiguous axial images were obtained from the base of the skull through the vertex without intravenous contrast. RADIATION DOSE REDUCTION: This exam was performed according to the departmental dose-optimization program which includes automated exposure control, adjustment of the mA and/or kV according to patient size and/or use of iterative reconstruction technique. COMPARISON:  CT head 08/17/2022 FINDINGS: Brain: No intracranial hemorrhage, mass effect, or evidence of acute infarct. No hydrocephalus. No extra-axial fluid collection. Age-commensurate cerebral atrophy and ill-defined hypoattenuation within the cerebral white matter consistent with chronic small vessel ischemic disease. Vascular: No hyperdense vessel. Intracranial arterial calcification. Skull: No fracture or focal lesion. Sinuses/Orbits: No  acute finding. Other: None. IMPRESSION: 1. No acute intracranial abnormality. Electronically Signed   By: Minerva Fester M.D.   On: 08/09/2023 00:37           LOS: 1 day   Time spent= 35 mins    Miguel Rota, MD [facility]  If 7PM-7AM, please contact night-coverage  08/10/2023, 12:00 PM

## 2023-08-10 NOTE — Hospital Course (Signed)
  Brief Narrative:  77 year old with history of HTN, HLD, hypothyroidism, GERD, CKD stage III, GI bleed, TIA, CHF with preserved EF, depression, breast cancer, cholecystectomy comes to the hospital with persistent vomiting, syncope and confusion.  With EMS patient was hypotensive and syncopized multiple times.  Lab work showed hypokalemia, acute kidney injury.  CT head was negative.  CT abdomen pelvis showed possible acute pancreatitis.     Assessment & Plan:  Principal Problem:   Intractable nausea and vomiting Active Problems:   Hyponatremia   Chronic diastolic CHF (congestive heart failure) (HCC)   Hypothyroidism   Hypokalemia   Benign essential hypertension   AKI (acute kidney injury) (HCC)   Syncope   Hypomagnesemia   QT prolongation   Acute metabolic encephalopathy   Elevated troponin    Intractable nausea and vomiting Possible acute mild pancreatitis as seen on CT scan but lipase levels are negative.  No other abnormality seen on CT abdomen pelvis.  LFTs are normal.  UA is pending.  IV fluids, supportive care.  Antiemetics as needed, diet as tolerated.     AKI on CKD stage III Baseline creatinine 1.2, admission creatinine 3.3, slowly improving   Syncope Likely due to severe dehydration/orthostatic hypotension.  PE less likely given no chest pain or hypoxia.  Patient is currently not tachycardic or tachypneic.  No seizure-like activity reported by family.  Echo shows preserved EF with mild LVH and RVH.   Hypokalemia/hypomagnesemia/hypophosphatemia Prolonged QTc Monitor and replete electrolytes   Mild hyponatremia Noted with fluids   Acute metabolic encephalopathy Mildly confused but overall answers most of the question.  Ammonia, TSH, B12 are normal.  No focal neurodeficits.  UA still pending.   Elevated troponin and EKG changes, no chest pain EKG showing new ST depressions/T wave inversions inferolaterally compared to previous EKG from a year ago.  Echo shows  preserved EF   Chronic HFpEF Echo done 2 years ago showing EF 55 to 60%, grade 2 diastolic dysfunction, normal RV function, trivial MVR.  No obvious signs of volume overload.  BNP is normal   Hypertension Resume Norvasc, Toprol and clonidine   Hyperlipidemia -Lipitor  Hypothyroidism: TSH normal.  GERD -Protonix  History of TIA -aspirin and statin  Depression -Prozac         DVT prophylaxis: heparin  Code Status: DNR Family Communication: Son [name] updated Continue hospital stay for ongoing evaluation for multiple electrolyte abnormality, PT/OT

## 2023-08-10 NOTE — Evaluation (Signed)
Physical Therapy Evaluation Patient Details Name: Cindy Huerta MRN: [id number] DOB: 11/03/1946 Today's Date: 08/10/2023  History of Present Illness  Pt is a 77 y/o female admitted 08/08/23 for intractable nausea and vomiting as well as syncopal episode. PMH includes Arthritis, Breast cancer, Depression, HLD, HTN, and Hypothyroidism; Cholecystectomy; Abdominal hysterectomy (1982); Total mastectomy (Left, 11/20/2021), CKD stage III, history of GI bleed, TIA, and chronic HFpEF .  Clinical Impression  Pt presents to PT with deficits in activity tolerance, gait, balance, functional mobility, cognition. Pt reports intermittent sensation of the room spinning during session as well as tinnitus in L ear after initial bed mobility. Pt appears very anxious during session, expressing a fear of falling multiple times. When ambulating for a short distance pt stops, reporting she feels as if she will fall, but denies dizziness, gait to that point had been relatively steady with support of walker. PT unable to fully assess vestibular function due to tangential thought and pt with difficulty consistently reporting symptoms. PT will continue to follow in an effort to improve activity tolerance and to reduce risk for falls. Pt BP stable throughout session. PT recommends short term inpatient PT services unless pt's caregivers are able to assist pt in all out of bed activity.        If plan is discharge home, recommend the following: A little help with walking and/or transfers;A little help with bathing/dressing/bathroom;Assistance with cooking/housework;Direct supervision/assist for medications management;Direct supervision/assist for financial management;Assist for transportation;Help with stairs or ramp for entrance;Supervision due to cognitive status   Can travel by private vehicle   Yes    Equipment Recommendations BSC/3in1  Recommendations for Other Services       Functional Status Assessment Patient has had  a recent decline in their functional status and demonstrates the ability to make significant improvements in function in a reasonable and predictable amount of time.     Precautions / Restrictions Precautions Precautions: Fall Restrictions Weight Bearing Restrictions: No      Mobility  Bed Mobility Overal bed mobility: Needs Assistance Bed Mobility: Supine to Sit     Supine to sit: Supervision, HOB elevated          Transfers Overall transfer level: Needs assistance Equipment used: 1 person hand held assist, Rolling walker (2 wheels) Transfers: Sit to/from Stand, Bed to chair/wheelchair/BSC Sit to Stand: Contact guard assist   Step pivot transfers: Contact guard assist            Ambulation/Gait Ambulation/Gait assistance: Contact guard assist Gait Distance (Feet): 20 Feet Assistive device: Rolling walker (2 wheels) Gait Pattern/deviations: Step-to pattern Gait velocity: reduced Gait velocity interpretation: <1.8 ft/sec, indicate of risk for recurrent falls   General Gait Details: pt with slowed step-to gait, ambulates ~10' and then becomes anxious and reports a need to turn around. States she feels she is going to fall.  Stairs            Wheelchair Mobility     Tilt Bed    Modified Rankin (Stroke Patients Only)       Balance Overall balance assessment: Needs assistance Sitting-balance support: No upper extremity supported, Feet supported Sitting balance-Leahy Scale: Good     Standing balance support: Single extremity supported, Reliant on assistive device for balance Standing balance-Leahy Scale: Poor                               Pertinent Vitals/Pain Pain Assessment Pain Assessment: No/denies  pain    Home Living Family/patient expects to be discharged to:: Private residence Living Arrangements: Children (son and daugher-in-law) Available Help at Discharge: Family;Available 24 hours/day Type of Home: House Home Access:  Stairs to enter Entrance Stairs-Rails: None Entrance Stairs-Number of Steps: 2 Alternate Level Stairs-Number of Steps: 1/2 flight +landing+ 1/2 flight Home Layout: Two level;Bed/bath upstairs Home Equipment: Agricultural consultant (2 wheels);Cane - quad;Shower seat      Prior Function Prior Level of Function : Independent/Modified Independent;History of Falls (last six months);Patient poor historian/Family not available             Mobility Comments: pt reports often ambulating without DME, states she utilizes a walker occasionally at night ADLs Comments: son assists with IADL, mod I with DME according to chart review     Extremity/Trunk Assessment   Upper Extremity Assessment Upper Extremity Assessment: Generalized weakness    Lower Extremity Assessment Lower Extremity Assessment: Generalized weakness    Cervical / Trunk Assessment Cervical / Trunk Assessment: Normal  Communication   Communication Communication: Difficulty communicating thoughts/reduced clarity of speech  Cognition Arousal: Alert Behavior During Therapy: Anxious Overall Cognitive Status: Impaired/Different from baseline Area of Impairment: Orientation, Attention, Memory, Following commands, Safety/judgement, Problem solving, Awareness                 Orientation Level: Disoriented to, Time Current Attention Level: Sustained Memory: Decreased recall of precautions, Decreased short-term memory Following Commands: Follows one step commands consistently, Follows multi-step commands consistently Safety/Judgement: Decreased awareness of safety, Decreased awareness of deficits Awareness: Emergent Problem Solving: Slow processing, Requires verbal cues General Comments: pt appears to have a great fear of falling. Tangential thought at times. Pt with soft speech throughout session but at times only mouths words without producing sound. Pt often does not answer PT questions directly, instead begins discussing other  topics.        General Comments General comments (skin integrity, edema, etc.): vitals stable during session. Full orthostatics unable to be obtained 2/2 cognitive deficits, however multiple BP recorded in supine and sitting.    Exercises     Assessment/Plan    PT Assessment Patient needs continued PT services  PT Problem List Decreased strength;Decreased activity tolerance;Decreased balance;Decreased mobility;Decreased cognition;Decreased knowledge of use of DME;Decreased safety awareness;Decreased knowledge of precautions       PT Treatment Interventions DME instruction;Gait training;Stair training;Functional mobility training;Therapeutic activities;Neuromuscular re-education;Balance training;Patient/family education    PT Goals (Current goals can be found in the Care Plan section)  Acute Rehab PT Goals Patient Stated Goal: to return home PT Goal Formulation: With patient Time For Goal Achievement: 08/24/23 Potential to Achieve Goals: Fair    Frequency Min 1X/week     Co-evaluation               AM-PAC PT "6 Clicks" Mobility  Outcome Measure Help needed turning from your back to your side while in a flat bed without using bedrails?: A Little Help needed moving from lying on your back to sitting on the side of a flat bed without using bedrails?: A Little Help needed moving to and from a bed to a chair (including a wheelchair)?: A Little Help needed standing up from a chair using your arms (e.g., wheelchair or bedside chair)?: A Little Help needed to walk in hospital room?: A Little Help needed climbing 3-5 steps with a railing? : Total 6 Click Score: 16    End of Session Equipment Utilized During Treatment: Gait belt Activity Tolerance: Other (comment) (limited  by anxiety, fear of falling) Patient left: in chair;with call bell/phone within reach;with chair alarm set Nurse Communication: Mobility status PT Visit Diagnosis: Other abnormalities of gait and mobility  (R26.89);Muscle weakness (generalized) (M62.81);History of falling (Z91.81)    Time: 9604-5409 PT Time Calculation (min) (ACUTE ONLY): 34 min   Charges:   PT Evaluation $PT Eval Low Complexity: 1 Low   PT General Charges $$ ACUTE PT VISIT: 1 Visit         Arlyss Gandy, PT, DPT Acute Rehabilitation Office [phone]   Arlyss Gandy 08/10/2023, 12:22 PM

## 2023-08-10 NOTE — Progress Notes (Signed)
Consult received to place a PIV. In reviewing the charted it is noted no current infusions, no tests/procedures that require a PIV. Patient is restricted on the L arm and infiltration to RFA. VAST recommends to start PIV when patient need for access d/t poor vasculature and restricted L arm. Secure chat was sent to Autumn G RN and Dr. Nelson Chimes. VU. Dr. Nelson Chimes resplied "ok". Tomasita Morrow, RN VAST

## 2023-08-11 LAB — BASIC METABOLIC PANEL
Anion gap: 14 (ref 5–15)
BUN: 16 mg/dL (ref 8–23)
CO2: 20 mmol/L — ABNORMAL LOW (ref 22–32)
Calcium: 8.1 mg/dL — ABNORMAL LOW (ref 8.9–10.3)
Chloride: 99 mmol/L (ref 98–111)
Creatinine, Ser: 1.2 mg/dL — ABNORMAL HIGH (ref 0.44–1.00)
GFR, Estimated: 46 mL/min — ABNORMAL LOW (ref >60)
Glucose, Bld: 109 mg/dL — ABNORMAL HIGH (ref 70–99)
Potassium: 4.1 mmol/L (ref 3.5–5.1)
Sodium: 133 mmol/L — ABNORMAL LOW (ref 135–145)

## 2023-08-11 LAB — MAGNESIUM: Magnesium: 2 mg/dL (ref 1.7–2.4)

## 2023-08-11 NOTE — Progress Notes (Signed)
PROGRESS NOTE    LANEL HURTT  UJW:[id number] DOB: Jul 10, 1946 DOA: 08/08/2023 PCP: Dorothey Baseman, MD     Brief Narrative:  77 year old with history of HTN, HLD, hypothyroidism, GERD, CKD stage III, GI bleed, TIA, CHF with preserved EF, depression, breast cancer, cholecystectomy comes to the hospital with persistent vomiting, syncope and confusion.  With EMS patient was hypotensive and syncopized multiple times.  Lab work showed hypokalemia, acute kidney injury.  CT head was negative.  CT abdomen pelvis showed possible acute pancreatitis.     Assessment & Plan:  Principal Problem:   Intractable nausea and vomiting Active Problems:   Hyponatremia   Chronic diastolic CHF (congestive heart failure) (HCC)   Hypothyroidism   Hypokalemia   Benign essential hypertension   AKI (acute kidney injury) (HCC)   Syncope   Hypomagnesemia   QT prolongation   Acute metabolic encephalopathy   Elevated troponin    Nausea and vomiting Pancreatitis? Possible acute mild pancreatitis as seen on CT scan but lipase levels are negative.  No other abnormality seen on CT abdomen pelvis. LFTs are normal.  UA is pending.  IV fluids, supportive care.  Antiemetics as needed, diet as tolerated. Appears resolved - will check triglycerides     AKI on CKD stage III Baseline creatinine 1.2, admission creatinine 3.3, slowly improving, labs pending for today   Syncope Likely due to severe dehydration/orthostatic hypotension.  PE less likely given no chest pain or hypoxia.  Patient is currently not tachycardic or tachypneic.  No seizure-like activity reported by family.  Echo shows preserved EF with mild LVH and RVH.   Hypokalemia/hypomagnesemia/hypophosphatemia Prolonged QTc Monitor and replete electrolytes   Mild hyponatremia Noted with fluids   Acute metabolic encephalopathy Mildly confused but overall answers most of the question.  Ammonia, TSH, B12 are normal.  No focal neurodeficits.  UA still  pending, have asked nurse to obtain. Son says mentation is close to baseline   Chronic HFpEF Echo done 2 years ago showing EF 55 to 60%, grade 2 diastolic dysfunction, normal RV function, trivial MVR.  No obvious signs of volume overload.  BNP is normal. Mild trop elevation likely demand, asymptomatic   Hypertension Bp controlled Resumed Norvasc, Toprol and clonidine   Hyperlipidemia -Lipitor  Hypothyroidism: TSH normal.  GERD -Protonix  History of TIA -aspirin and statin  Depression -Prozac  Debility Pt advising snf, son desires this, toc is consulted         DVT prophylaxis: heparin  Code Status: DNR Family Communication: Son [name] updated telephonically 9/5 Continue hospital stay for SNF placement        Subjective: Seen at bedside, still feeling little weak but improving   Examination:  General exam: Appears calm and comfortable, elderly frail. Respiratory system: Clear to auscultation. Respiratory effort normal. Cardiovascular system: S1 & S2 heard, RRR. No JVD, murmurs, rubs, gallops or clicks. No pedal edema. Gastrointestinal system: Abdomen is nondistended, soft and nontender. No organomegaly or masses felt. Normal bowel sounds heard. Central nervous system: Alert and oriented to self, place Extremities: Symmetric 5 x 5 power. Skin: No rashes, lesions or ulcers Psychiatry: mild confusion       Diet Orders (From admission, onward)     Start     Ordered   08/09/23 0322  Diet Heart Room service appropriate? Yes; Fluid consistency: Thin  Diet effective now       Question Answer Comment  Room service appropriate? Yes   Fluid consistency: Thin  08/09/23 0325            Objective: Vitals:   08/10/23 1650 08/10/23 2144 08/11/23 0433 08/11/23 0825  BP: (!) 119/56 (!) 132/55 (!) 150/62 (!) 142/74  Pulse: 64 64 66 63  Resp: 18 20 16 16   Temp: 98.6 F (37 C) 98.2 F (36.8 C) 98.3 F (36.8 C) 98.1 F (36.7 C)  TempSrc:   Oral   SpO2:  100% 100% 98% 100%  Weight:      Height:        Intake/Output Summary (Last 24 hours) at 08/11/2023 1213 Last data filed at 08/11/2023 0815 Gross per 24 hour  Intake 430.4 ml  Output 0 ml  Net 430.4 ml   Filed Weights   08/09/23 0335  Weight: 67 kg    Scheduled Meds:  amLODipine  10 mg Oral Daily   aspirin EC  81 mg Oral Daily   atorvastatin  80 mg Oral Daily   cloNIDine  0.1 mg Oral BID   FLUoxetine  20 mg Oral Daily   heparin  5,000 Units Subcutaneous Q8H   levothyroxine  75 mcg Oral Q0600   metoprolol succinate  50 mg Oral BID   pantoprazole  40 mg Oral Daily   Continuous Infusions:    Nutritional status     Body mass index is 23.84 kg/m.  Data Reviewed:   CBC: Recent Labs  Lab 08/08/23 2227 08/09/23 0615 08/09/23 1017  WBC 16.7* 14.3* 13.7*  NEUTROABS 12.4*  --   --   HGB 13.8 11.7* 12.8  HCT 38.6 34.4* 39.0  MCV 88.1 89.6 93.1  PLT 270 207 188   Basic Metabolic Panel: Recent Labs  Lab 08/08/23 2227 08/09/23 0615 08/09/23 1017 08/10/23 0631  NA 131* 131*  --  133*  K 2.3* 3.1*  --  3.5  CL 89* 98  --  99  CO2 22 20*  --  24  GLUCOSE 79 89  --  98  BUN 52* 46*  --  27*  CREATININE 3.33* 2.73*  --  1.38*  CALCIUM 9.1 7.7*  --  7.9*  MG 1.3* 1.2*  --  2.9*  PHOS  --   --  2.9 2.0*   GFR: Estimated Creatinine Clearance: 30.9 mL/min (A) (by C-G formula based on SCr of 1.38 mg/dL (H)). Liver Function Tests: Recent Labs  Lab 08/08/23 2227 08/09/23 0615  AST 53* 38  ALT 39 28  ALKPHOS 55 42  BILITOT 1.2 1.1  PROT 8.1 6.3*  ALBUMIN 4.1 3.2*   Recent Labs  Lab 08/08/23 2227  LIPASE 33   Recent Labs  Lab 08/08/23 2227  AMMONIA <10   Coagulation Profile: No results for input(s): "INR", "PROTIME" in the last 168 hours. Cardiac Enzymes: No results for input(s): "CKTOTAL", "CKMB", "CKMBINDEX", "TROPONINI" in the last 168 hours. BNP (last 3 results) No results for input(s): "PROBNP" in the last 8760 hours. HbA1C: No results for  input(s): "HGBA1C" in the last 72 hours. CBG: No results for input(s): "GLUCAP" in the last 168 hours. Lipid Profile: No results for input(s): "CHOL", "HDL", "LDLCALC", "TRIG", "CHOLHDL", "LDLDIRECT" in the last 72 hours. Thyroid Function Tests: Recent Labs    08/08/23 2227  TSH 3.215   Anemia Panel: Recent Labs    08/09/23 0615  VITAMINB12 873   Sepsis Labs: No results for input(s): "PROCALCITON", "LATICACIDVEN" in the last 168 hours.  Recent Results (from the past 240 hour(s))  Culture, blood (routine x 2)  Status: None (Preliminary result)   Collection Time: 08/08/23 10:27 PM   Specimen: BLOOD  Result Value Ref Range Status   Specimen Description BLOOD SITE NOT SPECIFIED  Final   Special Requests   Final    BOTTLES DRAWN AEROBIC AND ANAEROBIC Blood Culture adequate volume   Culture   Final    NO GROWTH 3 DAYS Performed at [facility], [address], [place], [place] [address]    Report Status PENDING  Incomplete  Resp panel by RT-PCR (RSV, Flu A&B, Covid) Anterior Nasal Swab     Status: None   Collection Time: 08/09/23 12:24 AM   Specimen: Anterior Nasal Swab  Result Value Ref Range Status   SARS Coronavirus 2 by RT PCR NEGATIVE NEGATIVE Final   Influenza A by PCR NEGATIVE NEGATIVE Final   Influenza B by PCR NEGATIVE NEGATIVE Final    Comment: (NOTE) The Xpert Xpress SARS-CoV-2/FLU/RSV plus assay is intended as an aid in the diagnosis of influenza from Nasopharyngeal swab specimens and should not be used as a sole basis for treatment. Nasal washings and aspirates are unacceptable for Xpert Xpress SARS-CoV-2/FLU/RSV testing.  Fact Sheet for Patients: BloggerCourse.com  Fact Sheet for Healthcare Providers: SeriousBroker.it  This test is not yet approved or cleared by the [place] FDA and has been authorized for detection and/or diagnosis of SARS-CoV-2 by FDA under an Emergency Use Authorization  (EUA). This EUA will remain in effect (meaning this test can be used) for the duration of the COVID-19 declaration under Section 564(b)(1) of the Act, 21 U.S.C. section 360bbb-3(b)(1), unless the authorization is terminated or revoked.     Resp Syncytial Virus by PCR NEGATIVE NEGATIVE Final    Comment: (NOTE) Fact Sheet for Patients: BloggerCourse.com  Fact Sheet for Healthcare Providers: SeriousBroker.it  This test is not yet approved or cleared by the [place] FDA and has been authorized for detection and/or diagnosis of SARS-CoV-2 by FDA under an Emergency Use Authorization (EUA). This EUA will remain in effect (meaning this test can be used) for the duration of the COVID-19 declaration under Section 564(b)(1) of the Act, 21 U.S.C. section 360bbb-3(b)(1), unless the authorization is terminated or revoked.  Performed at [facility], [address] [place], [place] [address]   Culture, blood (routine x 2)     Status: None (Preliminary result)   Collection Time: 08/09/23  6:15 AM   Specimen: BLOOD  Result Value Ref Range Status   Specimen Description BLOOD BLOOD RIGHT HAND  Final   Special Requests   Final    AEROBIC BOTTLE ONLY Blood Culture results may not be optimal due to an inadequate volume of blood received in culture bottles   Culture   Final    NO GROWTH 2 DAYS Performed at [facility], [address] [place], [place] [address]    Report Status PENDING  Incomplete         Radiology Studies: ECHOCARDIOGRAM COMPLETE  Result Date: 08/09/2023    ECHOCARDIOGRAM REPORT   Patient Name:   Meliah A Sugg Date of Exam: 08/09/2023 Medical Rec #:  [id number]       Height:       66.0 in Accession #:    [id number]      Weight:       147.7 lb Date of Birth:  Sep 30, 1946       BSA:          1.758 m Patient Age:  77 years        BP:           127/61 mmHg Patient Gender: F               HR:           98 bpm.  Exam Location:  Inpatient Procedure: 2D Echo, 3D Echo, Cardiac Doppler, Color Doppler and Strain Analysis Indications:    R94.31 Abnormal EKG  History:        Patient has prior history of Echocardiogram examinations, most                 recent 07/29/2021. CHF, TIA, Signs/Symptoms:Syncope, Chest Pain                 and Dizziness/Lightheadedness; Risk Factors:Hypertension and                 Dyslipidemia. Breast cancer.  Sonographer:    Sheralyn Boatman RDCS Referring Phys: [id number] VASUNDHRA RATHORE IMPRESSIONS  1. Left ventricular ejection fraction, by estimation, is 60 to 65%. Left ventricular ejection fraction by 3D volume is 63 %. The left ventricle has normal function. The left ventricle has no regional wall motion abnormalities. There is mild concentric left ventricular hypertrophy. Indeterminate diastolic filling due to E-A fusion.  2. Right ventricular systolic function is normal. The right ventricular size is normal. Moderately increased right ventricular wall thickness. There is normal pulmonary artery systolic pressure. The estimated right ventricular systolic pressure is 31.7 mmHg.  3. Right atrial size was mildly dilated.  4. The mitral valve is degenerative. Trivial mitral valve regurgitation. No evidence of mitral stenosis.  5. The aortic valve is tricuspid. Aortic valve regurgitation is not visualized. No aortic stenosis is present.  6. The inferior vena cava is normal in size with greater than 50% respiratory variability, suggesting right atrial pressure of 3 mmHg. FINDINGS  Left Ventricle: Left ventricular ejection fraction, by estimation, is 60 to 65%. Left ventricular ejection fraction by 3D volume is 63 %. The left ventricle has normal function. The left ventricle has no regional wall motion abnormalities. Global longitudinal strain performed but not reported based on interpreter judgement due to suboptimal tracking. The left ventricular internal cavity size was small. There is mild concentric left  ventricular hypertrophy. Indeterminate diastolic filling due to E-A fusion. Right Ventricle: The right ventricular size is normal. Moderately increased right ventricular wall thickness. Right ventricular systolic function is normal. There is normal pulmonary artery systolic pressure. The tricuspid regurgitant velocity is 2.68 m/s, and with an assumed right atrial pressure of 3 mmHg, the estimated right ventricular systolic pressure is 31.7 mmHg. Left Atrium: Left atrial size was normal in size. Right Atrium: Right atrial size was mildly dilated. Pericardium: There is no evidence of pericardial effusion. Mitral Valve: The mitral valve is degenerative in appearance. Trivial mitral valve regurgitation. No evidence of mitral valve stenosis. MV peak gradient, 7.6 mmHg. The mean mitral valve gradient is 4.0 mmHg. Tricuspid Valve: The tricuspid valve is normal in structure. Tricuspid valve regurgitation is mild . No evidence of tricuspid stenosis. Aortic Valve: The aortic valve is tricuspid. Aortic valve regurgitation is not visualized. No aortic stenosis is present. Pulmonic Valve: The pulmonic valve was normal in structure. Pulmonic valve regurgitation is trivial. No evidence of pulmonic stenosis. Aorta: The aortic root and ascending aorta are structurally normal, with no evidence of dilitation. Venous: The inferior vena cava is normal in size with greater than 50% respiratory variability, suggesting right atrial pressure  of 3 mmHg. IAS/Shunts: No atrial level shunt detected by color flow Doppler.  LEFT VENTRICLE PLAX 2D LVIDd:         3.50 cm         Diastology LVIDs:         2.40 cm         LV e' medial:    5.87 cm/s LV PW:         1.30 cm         LV E/e' medial:  13.5 LV IVS:        1.20 cm         LV e' lateral:   8.59 cm/s LVOT diam:     2.10 cm         LV E/e' lateral: 9.2 LV SV:         63 LV SV Index:   36 LVOT Area:     3.46 cm        3D Volume EF                                LV 3D EF:    Left                                              ventricul LV Volumes (MOD)                            ar LV vol d, MOD    20.5 ml                    ejection A2C:                                        fraction LV vol d, MOD    50.9 ml                    by 3D A4C:                                        volume is LV vol s, MOD    18.8 ml                    63 %. A2C: LV vol s, MOD    14.2 ml A4C:                           3D Volume EF: LV SV MOD A2C:   1.7 ml        3D EF:        63 % LV SV MOD A4C:   50.9 ml       LV EDV:       90 ml LV SV MOD BP:    9.1 ml        LV ESV:       33 ml  LV SV:        57 ml RIGHT VENTRICLE             IVC RV S prime:     11.30 cm/s  IVC diam: 1.50 cm TAPSE (M-mode): 2.0 cm LEFT ATRIUM             Index        RIGHT ATRIUM           Index LA diam:        3.00 cm 1.71 cm/m   RA Area:     13.10 cm LA Vol (A2C):   36.3 ml 20.65 ml/m  RA Volume:   29.50 ml  16.78 ml/m LA Vol (A4C):   26.6 ml 15.13 ml/m LA Biplane Vol: 34.0 ml 19.34 ml/m  AORTIC VALVE             PULMONIC VALVE LVOT Vmax:   99.80 cm/s  PR End Diast Vel: 1.72 msec LVOT Vmean:  66.800 cm/s LVOT VTI:    0.183 m  AORTA Ao Root diam: 2.80 cm Ao Asc diam:  3.30 cm MITRAL VALVE                TRICUSPID VALVE MV Area (PHT): 4.68 cm     TR Peak grad:   28.7 mmHg MV Area VTI:   2.33 cm     TR Vmax:        268.00 cm/s MV Peak grad:  7.6 mmHg MV Mean grad:  4.0 mmHg     SHUNTS MV Vmax:       1.38 m/s     Systemic VTI:  0.18 m MV Vmean:      89.3 cm/s    Systemic Diam: 2.10 cm MV Decel Time: 162 msec MV E velocity: 79.10 cm/s MV A velocity: 128.00 cm/s MV E/A ratio:  0.62 Mihai Croitoru MD Electronically signed by Thurmon Fair MD Signature Date/Time: 08/09/2023/3:59:16 PM    Final            LOS: 2 days   Time spent= 35 mins    Silvano Bilis, MD [facility]  If 7PM-7AM, please contact night-coverage  08/11/2023, 12:13 PM

## 2023-08-11 NOTE — TOC Progression Note (Signed)
Transition of Care [facility]) - Progression Note    Patient Details  Name: Cindy Huerta MRN: [id number] Date of Birth: 16-Jul-1946  Transition of Care [facility]) CM/SW Contact  Carmina Miller, [place] Phone Number: 08/11/2023, 3:37 PM  Clinical Narrative:     CSW spoke with pt's son [name], went over the current bed offers, he states pt has been to [place] before, he would Huerta her to return there. CSW explained insurance auth process, no other questions voiced. TOC will continue to follow.   Expected Discharge Plan: Skilled Nursing Facility Barriers to Discharge: Continued Medical Work up  Expected Discharge Plan and Services       Living arrangements for the past 2 months: Single Family Home                                       Social Determinants of Health (SDOH) Interventions SDOH Screenings   Food Insecurity: Patient Declined (12/29/2022)   Received from [facility], [facility]  Housing: Low Risk  (08/17/2022)  Transportation Needs: Patient Declined (12/29/2022)   Received from [facility], [facility]  Utilities: Patient Declined (12/29/2022)   Received from [facility], [facility]  Financial Resource Strain: Patient Declined (12/29/2022)   Received from [facility], [facility]  Tobacco Use: Low Risk  (08/08/2023)    Readmission Risk Interventions    06/20/2022    1:01 PM 11/25/2021    1:15 PM 10/10/2021    1:24 PM  Readmission Risk Prevention Plan  Transportation Screening  Complete Complete  PCP or Specialist Appt within 5-7 Days   Complete  PCP or Specialist Appt within 3-5 Days Complete Complete   Home Care Screening   Complete  Medication Review (RN CM)   Complete  HRI or Home Care Consult  Complete   Social Work Consult for Recovery Care Planning/Counseling Complete Complete   Palliative Care Screening Not  Applicable Not Applicable   Medication Review Oceanographer)  Complete

## 2023-08-11 NOTE — Progress Notes (Signed)
Occupational Therapy Treatment Patient Details Name: Cindy Huerta MRN: [id number] DOB: 12/11/1945 Today's Date: 08/11/2023   History of present illness Pt is a 77 y/o female admitted 08/08/23 for intractable nausea and vomiting as well as syncopal episode. PMH includes Arthritis, Breast cancer, Depression, HLD, HTN, and Hypothyroidism; Cholecystectomy; Abdominal hysterectomy (1982); Total mastectomy (Left, 11/20/2021), CKD stage III, history of GI bleed, TIA, and chronic HFpEF .   OT comments  Pt found incontinent of urine and in a puddle. Attempted to get patient on BSC, but when we came EOB CGA she immediately threw herself backwards stating "I don't want to fall" despite reassurances she declined further attempts at OOB. Pt participated in bed level bath and linen change, very cooperative, tangential in speech. OT POC remains appropriate. OT will continue to follow acutely      If plan is discharge home, recommend the following:  A lot of help with walking and/or transfers;A lot of help with bathing/dressing/bathroom;Assist for transportation;Help with stairs or ramp for entrance;Supervision due to cognitive status   Equipment Recommendations  Other (comment) (defer to next venue of care)    Recommendations for Other Services      Precautions / Restrictions Precautions Precautions: Fall Precaution Comments: anxious Restrictions Weight Bearing Restrictions: No       Mobility Bed Mobility Overal bed mobility: Needs Assistance Bed Mobility: Rolling, Supine to Sit Rolling: Contact guard assist, Used rails (cues for sequencing)   Supine to sit: Contact guard, HOB elevated, Used rails     General bed mobility comments: Pt initially coming EOB CGA upon sitting EOB Pt throwing herself backwards onto the bed saying "I can't do this I am going to fall" despite reassurance - she refused to sit EOB again. She was able to roll for bath and linen change with cues for sequencing and CGA     Transfers                   General transfer comment: NT this session     Balance                                           ADL either performed or assessed with clinical judgement   ADL Overall ADL's : Needs assistance/impaired Eating/Feeding: Minimal assistance;Bed level Eating/Feeding Details (indicate cue type and reason): cut up food for patient, then she was feeding herself when OT left the room Grooming: Wash/dry face;Wash/dry hands;Contact guard assist;Bed level   Upper Body Bathing: Set up;Bed level;Moderate assistance Upper Body Bathing Details (indicate cue type and reason): set up for front, mod A for back Lower Body Bathing: Moderate assistance Lower Body Bathing Details (indicate cue type and reason): knees down assist, Pt is able to wash front and back of naval down to knees Upper Body Dressing : Minimal assistance;Bed level (HOB elevated) Upper Body Dressing Details (indicate cue type and reason): new gown Lower Body Dressing: Maximal assistance;Bed level Lower Body Dressing Details (indicate cue type and reason): new socks             Functional mobility during ADLs:  (not safe today)      Extremity/Trunk Assessment Upper Extremity Assessment Upper Extremity Assessment: RUE deficits/detail RUE Deficits / Details: today Pt complaining of sharp right shoulder pain            Vision       Perception  Praxis      Cognition Arousal: Alert Behavior During Therapy: Anxious Overall Cognitive Status: Impaired/Different from baseline Area of Impairment: Orientation, Attention, Memory, Following commands, Safety/judgement, Problem solving, Awareness                 Orientation Level: Disoriented to, Time, Situation (does not know why she is in the hospital) Current Attention Level: Sustained Memory: Decreased recall of precautions, Decreased short-term memory Following Commands: Follows one step commands  consistently, Follows multi-step commands consistently Safety/Judgement: Decreased awareness of safety, Decreased awareness of deficits Awareness: Emergent Problem Solving: Slow processing, Requires verbal cues General Comments: very fearful of falling, tangential in speech and conversation, does not finish sentences jumps from one topic to the other, thought therapist was Pt's sister        Exercises      Shoulder Instructions       General Comments      Pertinent Vitals/ Pain       Pain Assessment Pain Assessment: Faces Faces Pain Scale: Hurts little more Pain Location: R shoulder with movement Pain Descriptors / Indicators: Grimacing, Tender Pain Intervention(s): Monitored during session, Repositioned  Home Living                                          Prior Functioning/Environment              Frequency  Min 1X/week        Progress Toward Goals  OT Goals(current goals can now be found in the care plan section)  Progress towards OT goals: Not progressing toward goals - comment (decreased OOB tolerance)  Acute Rehab OT Goals Patient Stated Goal: get something to drink OT Goal Formulation: With patient Time For Goal Achievement: 08/23/23 Potential to Achieve Goals: Good  Plan      Co-evaluation                 AM-PAC OT "6 Clicks" Daily Activity     Outcome Measure   Help from another person eating meals?: A Little Help from another person taking care of personal grooming?: A Little Help from another person toileting, which includes using toliet, bedpan, or urinal?: A Lot Help from another person bathing (including washing, rinsing, drying)?: A Lot Help from another person to put on and taking off regular upper body clothing?: A Little Help from another person to put on and taking off regular lower body clothing?: A Lot 6 Click Score: 15    End of Session    OT Visit Diagnosis: Unsteadiness on feet (R26.81);Repeated  falls (R29.6);Muscle weakness (generalized) (M62.81);History of falling (Z91.81);Other symptoms and signs involving cognitive function;Dizziness and giddiness (R42)   Activity Tolerance Patient tolerated treatment well (bed level due to anxiety and fear)   Patient Left in bed;with call bell/phone within reach;with bed alarm set   Nurse Communication Mobility status;Precautions (no purewick in place - full bath performed)        Time: 4098-1191 OT Time Calculation (min): 27 min  Charges: OT General Charges $OT Visit: 1 Visit OT Treatments $Self Care/Home Management : 23-37 mins  Nyoka Cowden OTR/L Acute Rehabilitation Services Office: [phone]  Emelda Fear 08/11/2023, 12:45 PM

## 2023-08-11 NOTE — NC FL2 (Signed)
[place] MEDICAID FL2 LEVEL OF CARE FORM     IDENTIFICATION  Patient Name: Cindy Huerta Birthdate: [birth year] Sex: female Admission Date (Current Location): 08/08/2023  [facility] and [place] Number:  Producer, television/film/video and Address:  [facility] [place], [address], [place], [place] [address]      Provider Number: [id number]  Attending Physician Name and Address:  Kathrynn Running, MD  Relative Name and Phone Number:       Current Level of Care: Hospital Recommended Level of Care: Skilled Nursing Facility Prior Approval Number:    Date Approved/Denied:   PASRR Number: [id number]  Discharge Plan:      Current Diagnoses: Patient Active Problem List   Diagnosis Date Noted   AKI (acute kidney injury) (HCC) 08/09/2023   Syncope 08/09/2023   Hypomagnesemia 08/09/2023   QT prolongation 08/09/2023   Acute metabolic encephalopathy 08/09/2023   Elevated troponin 08/09/2023   Iron deficiency anemia 08/17/2022   Elevated lactic acid level 08/17/2022   UTI (urinary tract infection) 08/17/2022   Fall at home, initial encounter 08/17/2022   Abnormal LFTs 08/17/2022   Dehydration 07/27/2022   Malnutrition of moderate degree 06/30/2022   Acute urinary retention 06/29/2022   Acute renal failure superimposed on stage 3b chronic kidney disease (HCC) 06/28/2022   DNR (do not resuscitate) 06/28/2022   Hyponatremia 06/18/2022   Hypokalemia 06/18/2022   Chronic diastolic CHF (congestive heart failure) (HCC) 06/18/2022   Dysuria 06/18/2022   HTN (hypertension) 11/24/2021   Normocytic anemia 11/24/2021   Vertigo 11/24/2021   Breast CA (HCC) 11/22/2021   Breast cancer (HCC) 11/20/2021   Intractable nausea and vomiting 10/09/2021   Weakness 10/07/2021   Carcinoma of overlapping sites of left breast in female, estrogen receptor positive (HCC) 10/06/2021   Dizziness 10/06/2021   Rectal bleeding 10/06/2021   Nausea & vomiting 10/06/2021   GIB  (gastrointestinal bleeding) 10/06/2021   Left-sided weakness 07/29/2021   HLD (hyperlipidemia) 07/29/2021   Hypothyroidism 07/29/2021   Left sided numbness 07/29/2021   Depression with anxiety    CKD (chronic kidney disease), stage IIIa    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   Epigastric pain 04/23/2021   GERD (gastroesophageal reflux disease) 04/23/2021   Benign essential hypertension 01/20/2021   History of breast cancer 01/20/2021   History of TIA (transient ischemic attack) 01/20/2021   Palpitations 01/20/2021   Malignant neoplasm of 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) 12/06/2018    Orientation RESPIRATION BLADDER Height & Weight     Self, Place  Normal Incontinent Weight: 147 lb 11.3 oz (67 kg) Height:  5\' 6"  (167.6 cm)  BEHAVIORAL SYMPTOMS/MOOD NEUROLOGICAL BOWEL NUTRITION STATUS      Incontinent Diet (see d/c summary)  AMBULATORY STATUS COMMUNICATION OF NEEDS Skin   Extensive Assist Verbally Normal                       Personal Care Assistance Level of Assistance  Bathing, Feeding, Dressing Bathing Assistance: Limited assistance Feeding assistance: Independent Dressing Assistance: Limited assistance     Functional Limitations Info  Sight, Hearing, Speech Sight Info: Adequate Hearing Info: Adequate Speech Info: Adequate    SPECIAL CARE FACTORS FREQUENCY  PT (By licensed PT), OT (By licensed OT)     PT Frequency: 5x/week OT Frequency: 5x/week            Contractures Contractures Info: Not present    Additional Factors Info  Code Status, Allergies Code Status Info: DNR-limited  Allergies Info: Novocain (procaine), oxycodone, Zestril (lisinopril), latex, Benadryl (diphenhydramine), Biaxin (clarithromycin),           Current Medications (08/11/2023):  This is the current hospital active medication list Current Facility-Administered Medications  Medication Dose Route Frequency Provider Last Rate Last Admin   acetaminophen (TYLENOL) tablet  650 mg  650 mg Oral Q6H PRN John Giovanni, MD       Or   acetaminophen (TYLENOL) suppository 650 mg  650 mg Rectal Q6H PRN John Giovanni, MD       amLODipine (NORVASC) tablet 10 mg  10 mg Oral Daily Amin, Ankit C, MD   10 mg at 08/11/23 4403   aspirin EC tablet 81 mg  81 mg Oral Daily Amin, Ankit C, MD   81 mg at 08/11/23 0952   atorvastatin (LIPITOR) tablet 80 mg  80 mg Oral Daily Amin, Ankit C, MD   80 mg at 08/11/23 0952   cloNIDine (CATAPRES) tablet 0.1 mg  0.1 mg Oral BID Amin, Ankit C, MD   0.1 mg at 08/11/23 0952   FLUoxetine (PROZAC) capsule 20 mg  20 mg Oral Daily Amin, Ankit C, MD   20 mg at 08/11/23 0952   heparin injection 5,000 Units  5,000 Units Subcutaneous Doyle Askew, MD   5,000 Units at 08/11/23 0657   hydrALAZINE (APRESOLINE) injection 10 mg  10 mg Intravenous Q4H PRN Amin, Ankit C, MD       ipratropium-albuterol (DUONEB) 0.5-2.5 (3) MG/3ML nebulizer solution 3 mL  3 mL Nebulization Q4H PRN Amin, Ankit C, MD       levothyroxine (SYNTHROID) tablet 75 mcg  75 mcg Oral Q0600 Amin, Ankit C, MD   75 mcg at 08/11/23 0656   metoprolol succinate (TOPROL-XL) 24 hr tablet 50 mg  50 mg Oral BID Amin, Ankit C, MD   50 mg at 08/11/23 4742   metoprolol tartrate (LOPRESSOR) injection 5 mg  5 mg Intravenous Q4H PRN Amin, Ankit C, MD       pantoprazole (PROTONIX) EC tablet 40 mg  40 mg Oral Daily Amin, Ankit C, MD   40 mg at 08/11/23 5956   senna-docusate (Senokot-S) tablet 1 tablet  1 tablet Oral QHS PRN Amin, Ankit C, MD       traZODone (DESYREL) tablet 50 mg  50 mg Oral QHS PRN Amin, Ankit C, MD       trimethobenzamide (TIGAN) injection 200 mg  200 mg Intramuscular Q8H PRN John Giovanni, MD         Discharge Medications: Please see discharge summary for a list of discharge medications.  Relevant Imaging Results:  Relevant Lab Results:   Additional Information SS# [id number]  [facility], LCSW

## 2023-08-11 NOTE — TOC Initial Note (Signed)
Transition of Care [facility]) - Initial/Assessment Note    Patient Details  Name: Cindy Huerta MRN: [id number] Date of Birth: 1946/09/10  Transition of Care [facility]) CM/SW Contact:    Erin Sons, LCSW Phone Number: 08/11/2023, 12:57 PM  Clinical Narrative:                  CSW called pt's son to discuss SNF recommendation. He states pt has been to [facility] and [place] in the past and liked both facilities. Son agreeable to SNF workup for [place] area. CSW to complete fl2 and fax bed requests in hub.   Expected Discharge Plan: Skilled Nursing Facility Barriers to Discharge: Continued Medical Work up      Expected Discharge Plan and Services       Living arrangements for the past 2 months: Single Family Home                                      Prior Living Arrangements/Services Living arrangements for the past 2 months: Single Family Home Lives with:: Adult Children Patient language and need for interpreter reviewed:: Yes        Need for Family Participation in Patient Care: Yes (Comment) Care giver support system in place?: Yes (comment)   Criminal Activity/Legal Involvement Pertinent to Current Situation/Hospitalization: No - Comment as needed  Activities of Daily Living      Permission Sought/Granted                  Emotional Assessment         Alcohol / Substance Use: Not Applicable Psych Involvement: No (comment)  Admission diagnosis:  Syncope and collapse [R55] Hypokalemia [E87.6] Hypomagnesemia [E83.42] Hyponatremia [E87.1] Elevated troponin [R79.89] Acute kidney injury (nontraumatic) (HCC) [N17.9] AKI (acute kidney injury) (HCC) [N17.9] Patient Active Problem List   Diagnosis Date Noted   AKI (acute kidney injury) (HCC) 08/09/2023   Syncope 08/09/2023   Hypomagnesemia 08/09/2023   QT prolongation 08/09/2023   Acute metabolic encephalopathy 08/09/2023   Elevated troponin 08/09/2023   Iron deficiency anemia 08/17/2022    Elevated lactic acid level 08/17/2022   UTI (urinary tract infection) 08/17/2022   Fall at home, initial encounter 08/17/2022   Abnormal LFTs 08/17/2022   Dehydration 07/27/2022   Malnutrition of moderate degree 06/30/2022   Acute urinary retention 06/29/2022   Acute renal failure superimposed on stage 3b chronic kidney disease (HCC) 06/28/2022   DNR (do not resuscitate) 06/28/2022   Hyponatremia 06/18/2022   Hypokalemia 06/18/2022   Chronic diastolic CHF (congestive heart failure) (HCC) 06/18/2022   Dysuria 06/18/2022   HTN (hypertension) 11/24/2021   Normocytic anemia 11/24/2021   Vertigo 11/24/2021   Breast CA (HCC) 11/22/2021   Breast cancer (HCC) 11/20/2021   Intractable nausea and vomiting 10/09/2021   Weakness 10/07/2021   Carcinoma of overlapping sites of left breast in female, estrogen receptor positive (HCC) 10/06/2021   Dizziness 10/06/2021   Rectal bleeding 10/06/2021   Nausea & vomiting 10/06/2021   GIB (gastrointestinal bleeding) 10/06/2021   Left-sided weakness 07/29/2021   HLD (hyperlipidemia) 07/29/2021   Hypothyroidism 07/29/2021   Left sided numbness 07/29/2021   Depression with anxiety    CKD (chronic kidney disease), stage IIIa    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   Epigastric pain 04/23/2021   GERD (gastroesophageal reflux disease) 04/23/2021   Benign essential hypertension 01/20/2021   History of breast cancer 01/20/2021   History  of TIA (transient ischemic attack) 01/20/2021   Palpitations 01/20/2021   Malignant neoplasm of 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) 12/06/2018   PCP:  Dorothey Baseman, MD Pharmacy:   [facility] [phone] - [address] [place] - [address] [place] [address] Phone: [phone] Fax: [phone]  [facility] [address] [place] [address] Phone: [phone] Fax: [phone]     Social Determinants of Health (SDOH) Social History: SDOH  Screenings   Food Insecurity: Patient Declined (12/29/2022)   [facility], [facility]  Housing: Low Risk  (08/17/2022)  Transportation Needs: Patient Declined (12/29/2022)   Received from [facility], [facility]  Utilities: Patient Declined (12/29/2022)   Received from [facility], [facility]  Financial Resource Strain: Patient Declined (12/29/2022)   Received from [facility], [facility]  Tobacco Use: Low Risk  (08/08/2023)   SDOH Interventions:     Readmission Risk Interventions    06/20/2022    1:01 PM 11/25/2021    1:15 PM 10/10/2021    1:24 PM  Readmission Risk Prevention Plan  Transportation Screening  Complete Complete  PCP or Specialist Appt within 5-7 Days   Complete  PCP or Specialist Appt within 3-5 Days Complete Complete   Home Care Screening   Complete  Medication Review (RN CM)   Complete  HRI or Home Care Consult  Complete   Social Work Consult for Recovery Care Planning/Counseling Complete Complete   Palliative Care Screening Not Applicable Not Applicable   Medication Review Oceanographer)  Complete

## 2023-08-11 NOTE — TOC Progression Note (Signed)
Transition of Care [facility]) - Progression Note    Patient Details  Name: Cindy Huerta MRN: [id number] Date of Birth: 1946/07/17  Transition of Care [facility]) CM/SW Contact  Erin Sons, [place] Phone Number: 08/11/2023, 4:37 PM  Clinical Narrative:     CSW attempted to start SNF auth though Online Portal is down. CSW will attempt at later time.   Expected Discharge Plan: Skilled Nursing Facility Barriers to Discharge: Continued Medical Work up  Expected Discharge Plan and Services       Living arrangements for the past 2 months: Single Family Home                                       Social Determinants of Health (SDOH) Interventions SDOH Screenings   Food Insecurity: Patient Declined (12/29/2022)   Received from [facility], [facility]  Housing: Low Risk  (08/17/2022)  Transportation Needs: Patient Declined (12/29/2022)   Received from [facility], [facility]  Utilities: Patient Declined (12/29/2022)   Received from [facility], [facility]  Financial Resource Strain: Patient Declined (12/29/2022)   Received from [facility], [facility]  Tobacco Use: Low Risk  (08/08/2023)    Readmission Risk Interventions    06/20/2022    1:01 PM 11/25/2021    1:15 PM 10/10/2021    1:24 PM  Readmission Risk Prevention Plan  Transportation Screening  Complete Complete  PCP or Specialist Appt within 5-7 Days   Complete  PCP or Specialist Appt within 3-5 Days Complete Complete   Home Care Screening   Complete  Medication Review (RN CM)   Complete  HRI or Home Care Consult  Complete   Social Work Consult for Recovery Care Planning/Counseling Complete Complete   Palliative Care Screening Not Applicable Not Applicable   Medication Review Oceanographer)  Complete

## 2023-08-12 DIAGNOSIS — R112 Nausea with vomiting, unspecified: Secondary | ICD-10-CM | POA: Diagnosis not present

## 2023-08-12 LAB — BASIC METABOLIC PANEL
Anion gap: 9 (ref 5–15)
BUN: 10 mg/dL (ref 8–23)
CO2: 26 mmol/L (ref 22–32)
Calcium: 8.6 mg/dL — ABNORMAL LOW (ref 8.9–10.3)
Chloride: 97 mmol/L — ABNORMAL LOW (ref 98–111)
Creatinine, Ser: 1.13 mg/dL — ABNORMAL HIGH (ref 0.44–1.00)
GFR, Estimated: 49 mL/min — ABNORMAL LOW (ref >60)
Glucose, Bld: 87 mg/dL (ref 70–99)
Potassium: 3.6 mmol/L (ref 3.5–5.1)
Sodium: 132 mmol/L — ABNORMAL LOW (ref 135–145)

## 2023-08-12 LAB — CBC
HCT: 32.1 % — ABNORMAL LOW (ref 36.0–46.0)
Hemoglobin: 10.9 g/dL — ABNORMAL LOW (ref 12.0–15.0)
MCH: 31.4 pg (ref 26.0–34.0)
MCHC: 34 g/dL (ref 30.0–36.0)
MCV: 92.5 fL (ref 80.0–100.0)
Platelets: 218 10*3/uL (ref 150–400)
RBC: 3.47 MIL/uL — ABNORMAL LOW (ref 3.87–5.11)
RDW: 13.1 % (ref 11.5–15.5)
WBC: 9.7 10*3/uL (ref 4.0–10.5)
nRBC: 0 % (ref 0.0–0.2)

## 2023-08-12 LAB — MAGNESIUM: Magnesium: 1.6 mg/dL — ABNORMAL LOW (ref 1.7–2.4)

## 2023-08-12 LAB — TRIGLYCERIDES: Triglycerides: 45 mg/dL (ref <150)

## 2023-08-12 MED ORDER — MAGNESIUM OXIDE -MG SUPPLEMENT 400 (240 MG) MG PO TABS
400.0000 mg | ORAL_TABLET | Freq: Two times a day (BID) | ORAL | Status: DC
  Administered 2023-08-12: 400 mg via ORAL
  Filled 2023-08-12: qty 1

## 2023-08-12 NOTE — Progress Notes (Signed)
Attempt number three made  to facility to call report. This RN was given a direct phone number from the operator at facility [phone]. RN was transferred to receiving nurse and phone rang with no answer

## 2023-08-12 NOTE — Care Management Important Message (Signed)
Important Message  Patient Details  Name: Cindy Huerta MRN: [id number] Date of Birth: [birth year]   Medicare Important Message Given:  Yes     Dorena Bodo 08/12/2023, 3:24 PM

## 2023-08-12 NOTE — TOC Transition Note (Signed)
Transition of Care [facility]) - CM/SW Discharge Note   Patient Details  Name: Cindy Huerta MRN: [id number] Date of Birth: 01/29/1946  Transition of Care [facility]) CM/SW Contact:  Erin Sons, LCSW Phone Number: 08/12/2023, 11:34 AM   Clinical Narrative:     Auth Approved 9/6-9/9 Auth# [id number]  Per MD patient ready for DC to [facility]. RN, patient, patient's family, and facility notified of DC. Discharge Summary and FL2 sent to facility. RN to call report prior to discharge [phone]  room 401). DC packet on chart. Ambulance transport requested for patient.   CSW will sign off for now as social work intervention is no longer needed. Please consult [place] again if new needs arise.   Final next level of care: Skilled Nursing Facility Barriers to Discharge: No Barriers Identified   Patient Goals and CMS Choice      Discharge Placement                Patient chooses bed at: [facility] Patient to be transferred to facility by: [facility] Name of family member notified: Son [name] Patient and family notified of of transfer: 08/12/23  Discharge Plan and Services Additional resources added to the After Visit Summary for                                       Social Determinants of Health (SDOH) Interventions SDOH Screenings   Food Insecurity: Patient Declined (12/29/2022)   Received from [facility], [facility]  Housing: Low Risk  (08/17/2022)  Transportation Needs: Patient Declined (12/29/2022)   Received from [facility], [facility]  Utilities: Patient Declined (12/29/2022)   Received from [facility], [facility]  Financial Resource Strain: Patient Declined (12/29/2022)   Received from [facility], [facility]  Tobacco Use: Low Risk  (08/08/2023)     Readmission Risk Interventions    06/20/2022    1:01 PM 11/25/2021    1:15  PM 10/10/2021    1:24 PM  Readmission Risk Prevention Plan  Transportation Screening  Complete Complete  PCP or Specialist Appt within 5-7 Days   Complete  PCP or Specialist Appt within 3-5 Days Complete Complete   Home Care Screening   Complete  Medication Review (RN CM)   Complete  HRI or Home Care Consult  Complete   Social Work Consult for Recovery Care Planning/Counseling Complete Complete   Palliative Care Screening Not Applicable Not Applicable   Medication Review Oceanographer)  Complete

## 2023-08-12 NOTE — TOC Progression Note (Signed)
Transition of Care [facility]) - Progression Note    Patient Details  Name: ARMIYAH ALMEDA MRN: [id number] Date of Birth: 1946-03-04  Transition of Care [facility]) CM/SW Contact  Erin Sons, [place] Phone Number: 08/12/2023, 9:31 AM  Clinical Narrative:     [facility] is pending. Ref# [id number]   Expected Discharge Plan: Skilled Nursing Facility Barriers to Discharge: Insurance Authorization  Expected Discharge Plan and Services       Living arrangements for the past 2 months: Single Family Home                                       Social Determinants of Health (SDOH) Interventions SDOH Screenings   Food Insecurity: Patient Declined (12/29/2022)   Received from [facility], [facility]  Housing: Low Risk  (08/17/2022)  Transportation Needs: Patient Declined (12/29/2022)   Received from [facility], [facility]  Utilities: Patient Declined (12/29/2022)   Received from [facility], [facility]  Financial Resource Strain: Patient Declined (12/29/2022)   Received from [facility], [facility]  Tobacco Use: Low Risk  (08/08/2023)    Readmission Risk Interventions    06/20/2022    1:01 PM 11/25/2021    1:15 PM 10/10/2021    1:24 PM  Readmission Risk Prevention Plan  Transportation Screening  Complete Complete  PCP or Specialist Appt within 5-7 Days   Complete  PCP or Specialist Appt within 3-5 Days Complete Complete   Home Care Screening   Complete  Medication Review (RN CM)   Complete  HRI or Home Care Consult  Complete   Social Work Consult for Recovery Care Planning/Counseling Complete Complete   Palliative Care Screening Not Applicable Not Applicable   Medication Review Oceanographer)  Complete

## 2023-08-12 NOTE — Progress Notes (Signed)
Physical Therapy Treatment Patient Details Name: Cindy Huerta MRN: [id number] DOB: 07-27-1946 Today's Date: 08/12/2023   History of Present Illness Pt is a 77 y/o female admitted 08/08/23 for intractable nausea and vomiting as well as syncopal episode. PMH includes Arthritis, Breast cancer, Depression, HLD, HTN, and Hypothyroidism; Cholecystectomy; Abdominal hysterectomy (1982); Total mastectomy (Left, 11/20/2021), CKD stage III, history of GI bleed, TIA, and chronic HFpEF .    PT Comments  Pt slowly progressing towards goals. Vestibular evaluation performed today. Symptoms are inconsistent and after Modified Dix-Hallpike and horizontal canal testing pt was reporting less dizziness getting to sitting and was CGA for sitting EOB from Mod A. Pt has difficulty following instructions for vestibular testing. Due to inconsistency with vertigo with movements most likely related to a VOR or neuritis rather than BPPV especially with tinnitus as a symptoms. Pt was able to perform bed mobility and sit to stand/transfer at [facility] after vertigo symptoms improved. Due to current functional status, home set up and available assistance at home recommending skilled physical therapy services < 3 hours/day on discharge from acute care hospital setting at this time in order to decrease risk for falls, injury and re-hospitalization.      If plan is discharge home, recommend the following: A little help with walking and/or transfers;Assist for transportation;Help with stairs or ramp for entrance;Supervision due to cognitive status;Assistance with cooking/housework   Can travel by private vehicle     Yes  Equipment Recommendations  BSC/3in1    Recommendations for Other Services       Precautions / Restrictions Precautions Precautions: Fall Precaution Comments: anxious Restrictions Weight Bearing Restrictions: No     Mobility  Bed Mobility Overal bed mobility: Needs Assistance Bed Mobility: Rolling, Supine to  Sit Rolling: Contact guard assist, Used rails   Supine to sit: Contact guard, HOB elevated, Used rails Sit to supine: Contact guard assist   General bed mobility comments: Pt sat EOB 2x 1x at Min-Mod A for balance with bil UE. After vestibular testing pt sat at [facility] and was able to sit EOB at [facility].    Transfers Overall transfer level: Needs assistance Equipment used: Rolling walker (2 wheels) Transfers: Sit to/from Stand, Bed to chair/wheelchair/BSC Sit to Stand: Contact guard assist   Step pivot transfers: Contact guard assist       General transfer comment: Pt was able after vestibular testing to perform sit to stand and tranfers at CGA with verbal cues intermittently.    Ambulation/Gait Ambulation/Gait assistance: Contact guard assist Gait Distance (Feet): 4 Feet Assistive device: Rolling walker (2 wheels) Gait Pattern/deviations: Step-to pattern Gait velocity: reduced Gait velocity interpretation: <1.8 ft/sec, indicate of risk for recurrent falls   General Gait Details: pt with slowed step-to gait, ambulates ~4' from EOB to recliner.       Balance Overall balance assessment: Needs assistance Sitting-balance support: No upper extremity supported, Feet supported, Bilateral upper extremity supported Sitting balance-Leahy Scale: Poor Sitting balance - Comments: pt requires physical assist intermittently due to dizziness.   Standing balance support: Single extremity supported, Reliant on assistive device for balance Standing balance-Leahy Scale: Poor        Cognition Arousal: Alert Behavior During Therapy: Anxious Overall Cognitive Status: No family/caregiver present to determine baseline cognitive functioning     Following Commands: Follows one step commands with increased time, Follows multi-step commands with increased time     Problem Solving: Slow processing, Requires verbal cues General Comments: very fearful of falling, tangential in speech and  conversation           General Comments General comments (skin integrity, edema, etc.): Vestibular evaluation performed today.Gaze Stabilization and VOR both increased symptoms of dizziness. VOR increased feelings of nausea/vomiting though pt did not vomit. Pt reports improvement with white noise stimulation for tinnitus though she does still hear the tinnitus. Attempted modified Dix-Hallpike, pt has > 1 min nystagmus with any cervical extension and L rotation; improved on the R.      Pertinent Vitals/Pain Pain Assessment Pain Assessment: No/denies pain     PT Goals (current goals can now be found in the care plan section) Acute Rehab PT Goals Patient Stated Goal: to return home PT Goal Formulation: With patient Time For Goal Achievement: 08/24/23 Potential to Achieve Goals: Fair Progress towards PT goals: Progressing toward goals    Frequency    Min 1X/week      PT Plan  Continue with current POC       AM-PAC PT "6 Clicks" Mobility   Outcome Measure  Help needed turning from your back to your side while in a flat bed without using bedrails?: A Little Help needed moving from lying on your back to sitting on the side of a flat bed without using bedrails?: A Little Help needed moving to and from a bed to a chair (including a wheelchair)?: A Little Help needed standing up from a chair using your arms (e.g., wheelchair or bedside chair)?: A Little Help needed to walk in hospital room?: A Little Help needed climbing 3-5 steps with a railing? : Total 6 Click Score: 16    End of Session Equipment Utilized During Treatment: Gait belt Activity Tolerance: Patient tolerated treatment well;Other (comment) (anxiety is limiting for pt) Patient left: in chair;with call bell/phone within reach;with chair alarm set Nurse Communication: Mobility status PT Visit Diagnosis: Other abnormalities of gait and mobility (R26.89);Muscle weakness (generalized) (M62.81);History of falling  (Z91.81)     Time: 1610-9604 PT Time Calculation (min) (ACUTE ONLY): 47 min  Charges:    $Therapeutic Activity: 38-52 mins PT General Charges $$ ACUTE PT VISIT: 1 Visit                     Harrel Carina, DPT, CLT  Acute Rehabilitation Services Office: [phone] (Secure chat preferred)    Claudia Desanctis 08/12/2023, 11:33 AM

## 2023-08-12 NOTE — Discharge Summary (Signed)
Physician Discharge Summary  NOVI BELD UJW:[id number] DOB: [birth year] DOA: 08/08/2023  PCP: Dorothey Baseman, MD  Admit date: 08/08/2023 Discharge date: 08/12/2023  Admitted From: Home Disposition: Skilled nursing facility  Recommendations for Outpatient Follow-up:  Follow up with PCP in 1-2 weeks Please obtain BMP/CBC/magnesium/phosphorus in one week  Discharge Condition: Stable CODE STATUS: DNR Diet recommendation: Low-salt diet, encourage nutrition and fluid intake.  Discharge summary: 77 year old with history of HTN, HLD, hypothyroidism, GERD, CKD stage IIIa, GI bleed, TIA, CHF with preserved EF, depression, breast cancer, cholecystectomy comes to the hospital with persistent vomiting, syncope and confusion.  With EMS patient was hypotensive and syncopized multiple times.  Lab work showed hypokalemia, acute kidney injury.  CT head was negative.  CT abdomen pelvis showed possible acute pancreatitis without complications.  She was admitted and treated symptomatically with IV fluids and nausea medications with clinical improvement.  Remains debilitated but clinically improved.     Assessment & plan of care:    Nausea and vomiting Pancreatitis? Possible acute mild pancreatitis as seen on CT scan but lipase levels are normal. CT scan without complications. LFTs are normal.  Triglycerides are normal. Nausea vomiting and abdominal pain he is completely improved.  She is eating regular diet now with normal bowel function.  There is no gallbladder.  No alcohol.     AKI on CKD stage IIIa: Baseline creatinine 1.2, admission creatinine 3.3, stabilizing.  Back to to her normal.    Syncope Likely due to severe dehydration/orthostatic hypotension.  Echo shows preserved EF with mild LVH and RVH.  Improved.   Hypokalemia/hypomagnesemia/hypophosphatemia Prolonged QTc Monitor and replete electrolytes Will discharge patient on scheduled potassium and magnesium.  Will need recheck in 1 week.    Acute metabolic encephalopathy Mildly confused but overall answers most of the question.  Ammonia, TSH, B12 are normal.  No focal neurodeficits.  Mental status back to baseline.   Chronic HFpEF Echo done 2 years ago showing EF 55 to 60%, grade 2 diastolic dysfunction, normal RV function, trivial MVR.  No obvious signs of volume overload.  BNP is normal. Mild trop elevation likely demand, asymptomatic   Hypertension Bp controlled Resumed Norvasc, Toprol and clonidine.  Patient is high risk of dehydration and orthostatics.  Discontinue hydralazine , telmisartan and diuretics.   Hyperlipidemia -Lipitor.  Continue.   Hypothyroidism: TSH normal.  Continue similar dose of thyroxine.   GERD -Protonix   History of TIA -aspirin and statin.  No new neurological deficits.   Depression -Prozac   Physical debility -Needs a SNF for rehab before going home independent.  Stable for discharge.    Discharge Diagnoses:  Principal Problem:   Intractable nausea and vomiting Active Problems:   Hyponatremia   Chronic diastolic CHF (congestive heart failure) (HCC)   Hypothyroidism   Hypokalemia   Benign essential hypertension   AKI (acute kidney injury) (HCC)   Syncope   Hypomagnesemia   QT prolongation   Acute metabolic encephalopathy   Elevated troponin    Discharge Instructions  Discharge Instructions     Diet - low sodium heart healthy   Complete by: As directed    Increase activity slowly   Complete by: As directed       Allergies as of 08/12/2023       Reactions   Latex Rash   Novocain [procaine] Other (See Comments)   Unsure - told by DDS not to let anyone give it to her  Confusion   Zestril [lisinopril] Cough   Benadryl [  diphenhydramine] Other (See Comments)   Jitteriness Agitation   Biaxin [clarithromycin] Other (See Comments)   Confusion   Roxicodone [oxycodone] Nausea And Vomiting, Anxiety        Medication List     STOP taking these medications     hydrALAZINE 50 MG tablet Commonly known as: APRESOLINE   LORazepam 0.5 MG tablet Commonly known as: ATIVAN   telmisartan 80 MG tablet Commonly known as: MICARDIS   triamterene-hydrochlorothiazide 37.5-25 MG tablet Commonly known as: MAXZIDE-25       TAKE these medications    acetaminophen 500 MG tablet Commonly known as: TYLENOL Take 500-1,000 mg by mouth every 6 (six) hours as needed for moderate pain.   amLODipine 10 MG tablet Commonly known as: NORVASC Take 1 tablet (10 mg total) by mouth daily.   Artificial Tears 0.1-0.3 % Soln Generic drug: Dextran 70-Hypromellose Place 1 drop into both eyes daily as needed for dry eyes.   aspirin EC 81 MG tablet Take 1 tablet (81 mg total) by mouth daily. RESTART 48HRS AFTER DISCHARGE   atorvastatin 80 MG tablet Commonly known as: LIPITOR Take 80 mg by mouth daily.   cloNIDine 0.1 MG tablet Commonly known as: CATAPRES Take 0.1 mg by mouth 2 (two) times daily.   diphenhydramine-acetaminophen 25-500 MG Tabs tablet Commonly known as: TYLENOL PM Take 1 tablet by mouth at bedtime as needed (Pain).   FLUoxetine 20 MG capsule Commonly known as: PROZAC Take 20 mg by mouth daily.   fluticasone 50 MCG/ACT nasal spray Commonly known as: FLONASE Place 1 spray into both nostrils daily as needed for allergies or rhinitis.   IRON PO Take 1 tablet by mouth daily.   levothyroxine 75 MCG tablet Commonly known as: SYNTHROID Take 75 mcg by mouth daily before breakfast.   magnesium oxide 400 MG tablet Commonly known as: MAG-OX Take 1 tablet (400 mg total) by mouth daily.   metoprolol succinate 50 MG 24 hr tablet Commonly known as: TOPROL-XL Take 50 mg by mouth 2 (two) times daily.   pantoprazole 40 MG tablet Commonly known as: PROTONIX Take 1 tablet (40 mg total) by mouth daily.   potassium chloride SA 20 MEQ tablet Commonly known as: KLOR-CON M Take 20 mEq by mouth daily.   trolamine salicylate 10 % cream Commonly  known as: ASPERCREME Apply 1 application topically as needed for muscle pain.   VITAMIN D-3 PO Take 1 capsule by mouth daily.        Allergies  Allergen Reactions   Latex Rash   Novocain [Procaine] Other (See Comments)    Unsure - told by DDS not to let anyone give it to her  Confusion    Zestril [Lisinopril] Cough   Benadryl [Diphenhydramine] Other (See Comments)    Jitteriness Agitation   Biaxin [Clarithromycin] Other (See Comments)    Confusion     Roxicodone [Oxycodone] Nausea And Vomiting and Anxiety    Consultations: None   Procedures/Studies: ECHOCARDIOGRAM COMPLETE  Result Date: 08/09/2023    ECHOCARDIOGRAM REPORT   Patient Name:   Cindy Huerta Date of Exam: 08/09/2023 Medical Rec #:  [id number]       Height:       66.0 in Accession #:    [id number]      Weight:       147.7 lb Date of Birth:  1945-12-29       BSA:          1.758 m Patient Age:    34 years  BP:           127/61 mmHg Patient Gender: F               HR:           98 bpm. Exam Location:  Inpatient Procedure: 2D Echo, 3D Echo, Cardiac Doppler, Color Doppler and Strain Analysis Indications:    R94.31 Abnormal EKG  History:        Patient has prior history of Echocardiogram examinations, most                 recent 07/29/2021. CHF, TIA, Signs/Symptoms:Syncope, Chest Pain                 and Dizziness/Lightheadedness; Risk Factors:Hypertension and                 Dyslipidemia. Breast cancer.  Sonographer:    Sheralyn Boatman RDCS Referring Phys: [id number] VASUNDHRA RATHORE IMPRESSIONS  1. Left ventricular ejection fraction, by estimation, is 60 to 65%. Left ventricular ejection fraction by 3D volume is 63 %. The left ventricle has normal function. The left ventricle has no regional wall motion abnormalities. There is mild concentric left ventricular hypertrophy. Indeterminate diastolic filling due to E-A fusion.  2. Right ventricular systolic function is normal. The right ventricular size is normal. Moderately increased  right ventricular wall thickness. There is normal pulmonary artery systolic pressure. The estimated right ventricular systolic pressure is 31.7 mmHg.  3. Right atrial size was mildly dilated.  4. The mitral valve is degenerative. Trivial mitral valve regurgitation. No evidence of mitral stenosis.  5. The aortic valve is tricuspid. Aortic valve regurgitation is not visualized. No aortic stenosis is present.  6. The inferior vena cava is normal in size with greater than 50% respiratory variability, suggesting right atrial pressure of 3 mmHg. FINDINGS  Left Ventricle: Left ventricular ejection fraction, by estimation, is 60 to 65%. Left ventricular ejection fraction by 3D volume is 63 %. The left ventricle has normal function. The left ventricle has no regional wall motion abnormalities. Global longitudinal strain performed but not reported based on interpreter judgement due to suboptimal tracking. The left ventricular internal cavity size was small. There is mild concentric left ventricular hypertrophy. Indeterminate diastolic filling due to E-A fusion. Right Ventricle: The right ventricular size is normal. Moderately increased right ventricular wall thickness. Right ventricular systolic function is normal. There is normal pulmonary artery systolic pressure. The tricuspid regurgitant velocity is 2.68 m/s, and with an assumed right atrial pressure of 3 mmHg, the estimated right ventricular systolic pressure is 31.7 mmHg. Left Atrium: Left atrial size was normal in size. Right Atrium: Right atrial size was mildly dilated. Pericardium: There is no evidence of pericardial effusion. Mitral Valve: The mitral valve is degenerative in appearance. Trivial mitral valve regurgitation. No evidence of mitral valve stenosis. MV peak gradient, 7.6 mmHg. The mean mitral valve gradient is 4.0 mmHg. Tricuspid Valve: The tricuspid valve is normal in structure. Tricuspid valve regurgitation is mild . No evidence of tricuspid stenosis.  Aortic Valve: The aortic valve is tricuspid. Aortic valve regurgitation is not visualized. No aortic stenosis is present. Pulmonic Valve: The pulmonic valve was normal in structure. Pulmonic valve regurgitation is trivial. No evidence of pulmonic stenosis. Aorta: The aortic root and ascending aorta are structurally normal, with no evidence of dilitation. Venous: The inferior vena cava is normal in size with greater than 50% respiratory variability, suggesting right atrial pressure of 3 mmHg. IAS/Shunts: No atrial level shunt detected  by color flow Doppler.  LEFT VENTRICLE PLAX 2D LVIDd:         3.50 cm         Diastology LVIDs:         2.40 cm         LV e' medial:    5.87 cm/s LV PW:         1.30 cm         LV E/e' medial:  13.5 LV IVS:        1.20 cm         LV e' lateral:   8.59 cm/s LVOT diam:     2.10 cm         LV E/e' lateral: 9.2 LV SV:         63 LV SV Index:   36 LVOT Area:     3.46 cm        3D Volume EF                                LV 3D EF:    Left                                             ventricul LV Volumes (MOD)                            ar LV vol d, MOD    20.5 ml                    ejection A2C:                                        fraction LV vol d, MOD    50.9 ml                    by 3D A4C:                                        volume is LV vol s, MOD    18.8 ml                    63 %. A2C: LV vol s, MOD    14.2 ml A4C:                           3D Volume EF: LV SV MOD A2C:   1.7 ml        3D EF:        63 % LV SV MOD A4C:   50.9 ml       LV EDV:       90 ml LV SV MOD BP:    9.1 ml        LV ESV:       33 ml                                LV SV:  57 ml RIGHT VENTRICLE             IVC RV S prime:     11.30 cm/s  IVC diam: 1.50 cm TAPSE (M-mode): 2.0 cm LEFT ATRIUM             Index        RIGHT ATRIUM           Index LA diam:        3.00 cm 1.71 cm/m   RA Area:     13.10 cm LA Vol (A2C):   36.3 ml 20.65 ml/m  RA Volume:   29.50 ml  16.78 ml/m LA Vol (A4C):   26.6 ml 15.13  ml/m LA Biplane Vol: 34.0 ml 19.34 ml/m  AORTIC VALVE             PULMONIC VALVE LVOT Vmax:   99.80 cm/s  PR End Diast Vel: 1.72 msec LVOT Vmean:  66.800 cm/s LVOT VTI:    0.183 m  AORTA Ao Root diam: 2.80 cm Ao Asc diam:  3.30 cm MITRAL VALVE                TRICUSPID VALVE MV Area (PHT): 4.68 cm     TR Peak grad:   28.7 mmHg MV Area VTI:   2.33 cm     TR Vmax:        268.00 cm/s MV Peak grad:  7.6 mmHg MV Mean grad:  4.0 mmHg     SHUNTS MV Vmax:       1.38 m/s     Systemic VTI:  0.18 m MV Vmean:      89.3 cm/s    Systemic Diam: 2.10 cm MV Decel Time: 162 msec MV E velocity: 79.10 cm/s MV A velocity: 128.00 cm/s MV E/A ratio:  0.62 Mihai Croitoru MD Electronically signed by Thurmon Fair MD Signature Date/Time: 08/09/2023/3:59:16 PM    Final    CT ABDOMEN PELVIS WO CONTRAST  Result Date: 08/09/2023 CLINICAL DATA:  Abdominal pain and vomiting. EXAM: CT ABDOMEN AND PELVIS WITHOUT CONTRAST TECHNIQUE: Multidetector CT imaging of the abdomen and pelvis was performed following the standard protocol without IV contrast. RADIATION DOSE REDUCTION: This exam was performed according to the departmental dose-optimization program which includes automated exposure control, adjustment of the mA and/or kV according to patient size and/or use of iterative reconstruction technique. COMPARISON:  CTA chest, abdomen and pelvis 07/27/2022, CT abdomen and pelvis with IV contrast 06/05/2021. FINDINGS: Lower chest: Small chronic hiatal hernia. The cardiac size is normal. Visualized lung bases are clear. The right hemidiaphragm again mildly elevated with the dome of the right hemidiaphragm excluded from the study. Hepatobiliary: The dome of the liver was partially excluded from the study. The visualized liver demonstrating no appreciable mass without contrast. Gallbladder again noted absent without biliary dilatation. Streak artifact from the patient's arms in the field limits fine detail. Pancreas: There is suspected stranding around  the pancreatic head, uncinate process and neck although the finding could be exaggerated due to artifact from the patient's arms. Correlate with serum lipase for possible acute pancreatitis. The pancreas is partially atrophic and otherwise unremarkable. There is no appreciable ductal dilatation. Spleen: Unremarkable without contrast and allowing for superimposed streak artifacts. Adrenals/Urinary Tract: There is no adrenal mass. There is no contour deforming abnormality of either kidney. There is no urinary stone or obstruction. The bladder thickness is normal. Stomach/Bowel: No dilatation or wall thickening. An appendix is not seen in this patient. There  is fluid in the ascending colon. There are colonic diverticula most numerous along the descending and sigmoid segments but no findings of acute diverticulitis. Vascular/Lymphatic: Aortic atherosclerosis. No enlarged abdominal or pelvic lymph nodes. Reproductive: Status post hysterectomy. No adnexal masses. Other: No abdominal wall hernia or abnormality. No abdominopelvic ascites. There is no peripancreatic localizing fluid collection, free hemorrhage or free air. Musculoskeletal: There is osteopenia with degenerative changes of the spine, hips and SI joints. No acute or suspicious osseous findings. IMPRESSION: 1. Streak artifact from the patient's arms in the field limits fine detail. 2. There appears to be stranding around the pancreatic head, uncinate process and neck. Correlate with serum lipase for possible acute pancreatitis. No peripancreatic fluid or hemorrhage is seen. 3. Fluid in the ascending colon. No bowel wall thickening or dilatation. 4. Diverticulosis without evidence of diverticulitis. 5. Aortic atherosclerosis. 6. Small hiatal hernia. 7. Osteopenia and degenerative change. Aortic Atherosclerosis (ICD10-I70.0). Electronically Signed   By: Almira Bar M.D.   On: 08/09/2023 00:49   CT Head Wo Contrast  Result Date: 08/09/2023 CLINICAL DATA:   Vomiting and syncopal episode. Altered mental status EXAM: CT HEAD WITHOUT CONTRAST TECHNIQUE: Contiguous axial images were obtained from the base of the skull through the vertex without intravenous contrast. RADIATION DOSE REDUCTION: This exam was performed according to the departmental dose-optimization program which includes automated exposure control, adjustment of the mA and/or kV according to patient size and/or use of iterative reconstruction technique. COMPARISON:  CT head 08/17/2022 FINDINGS: Brain: No intracranial hemorrhage, mass effect, or evidence of acute infarct. No hydrocephalus. No extra-axial fluid collection. Age-commensurate cerebral atrophy and ill-defined hypoattenuation within the cerebral white matter consistent with chronic small vessel ischemic disease. Vascular: No hyperdense vessel. Intracranial arterial calcification. Skull: No fracture or focal lesion. Sinuses/Orbits: No acute finding. Other: None. IMPRESSION: 1. No acute intracranial abnormality. Electronically Signed   By: Minerva Fester M.D.   On: 08/09/2023 00:37   (Echo, Carotid, EGD, Colonoscopy, ERCP)    Subjective: Patient seen and examined in the morning rounds.  Pleasant and confused.  Denies any complaints.  Eating regular diet.  Denies any nausea vomiting or abdominal pain.   Discharge Exam: Vitals:   08/12/23 0531 08/12/23 0859  BP: (!) 131/49 (!) 156/63  Pulse: (!) 56 (!) 55  Resp: 15 18  Temp: 97.8 F (36.6 C) 98.4 F (36.9 C)  SpO2: 100% 100%   Vitals:   08/11/23 1523 08/11/23 2119 08/12/23 0531 08/12/23 0859  BP: 121/68 (!) 139/58 (!) 131/49 (!) 156/63  Pulse: (!) 59 (!) 56 (!) 56 (!) 55  Resp: 16 18 15 18   Temp: 98.1 F (36.7 C) 97.9 F (36.6 C) 97.8 F (36.6 C) 98.4 F (36.9 C)  TempSrc: Oral Oral    SpO2: 100% 100% 100% 100%  Weight:   73.1 kg   Height:        General: Pt is alert, awake, not in acute distress Oriented to herself and family. Pleasant and interactive.   Cardiovascular: RRR, S1/S2 +, no rubs, no gallops Respiratory: CTA bilaterally, no wheezing, no rhonchi Abdominal: Soft, NT, ND, bowel sounds + Extremities: no edema, no cyanosis    The results of significant diagnostics from this hospitalization (including imaging, microbiology, ancillary and laboratory) are listed below for reference.     Microbiology: Recent Results (from the past 240 hour(s))  Culture, blood (routine x 2)     Status: None (Preliminary result)   Collection Time: 08/08/23 10:27 PM   Specimen: BLOOD  Result Value Ref Range Status   Specimen Description BLOOD SITE NOT SPECIFIED  Final   Special Requests   Final    BOTTLES DRAWN AEROBIC AND ANAEROBIC Blood Culture adequate volume   Culture   Final    NO GROWTH 4 DAYS Performed at [facility], [address], [place], [place] [address]    Report Status PENDING  Incomplete  Resp panel by RT-PCR (RSV, Flu A&B, Covid) Anterior Nasal Swab     Status: None   Collection Time: 08/09/23 12:24 AM   Specimen: Anterior Nasal Swab  Result Value Ref Range Status   SARS Coronavirus 2 by RT PCR NEGATIVE NEGATIVE Final   Influenza A by PCR NEGATIVE NEGATIVE Final   Influenza B by PCR NEGATIVE NEGATIVE Final    Comment: (NOTE) The Xpert Xpress SARS-CoV-2/FLU/RSV plus assay is intended as an aid in the diagnosis of influenza from Nasopharyngeal swab specimens and should not be used as a sole basis for treatment. Nasal washings and aspirates are unacceptable for Xpert Xpress SARS-CoV-2/FLU/RSV testing.  Fact Sheet for Patients: BloggerCourse.com  Fact Sheet for Healthcare Providers: SeriousBroker.it  This test is not yet approved or cleared by the [place] FDA and has been authorized for detection and/or diagnosis of SARS-CoV-2 by FDA under an Emergency Use Authorization (EUA). This EUA will remain in effect (meaning this test can be used) for the duration of  the COVID-19 declaration under Section 564(b)(1) of the Act, 21 U.S.C. section 360bbb-3(b)(1), unless the authorization is terminated or revoked.     Resp Syncytial Virus by PCR NEGATIVE NEGATIVE Final    Comment: (NOTE) Fact Sheet for Patients: BloggerCourse.com  Fact Sheet for Healthcare Providers: SeriousBroker.it  This test is not yet approved or cleared by the [place] FDA and has been authorized for detection and/or diagnosis of SARS-CoV-2 by FDA under an Emergency Use Authorization (EUA). This EUA will remain in effect (meaning this test can be used) for the duration of the COVID-19 declaration under Section 564(b)(1) of the Act, 21 U.S.C. section 360bbb-3(b)(1), unless the authorization is terminated or revoked.  Performed at [facility], [address] [place], [place] [address]   Culture, blood (routine x 2)     Status: None (Preliminary result)   Collection Time: 08/09/23  6:15 AM   Specimen: BLOOD  Result Value Ref Range Status   Specimen Description BLOOD BLOOD RIGHT HAND  Final   Special Requests   Final    AEROBIC BOTTLE ONLY Blood Culture results may not be optimal due to an inadequate volume of blood received in culture bottles   Culture   Final    NO GROWTH 3 DAYS Performed at [facility], [address], [place], [place] [address]    Report Status PENDING  Incomplete     Labs: BNP (last 3 results) Recent Labs    08/17/22 0403 08/20/22 0530 08/09/23 0615  BNP 33.0 49.4 76.7   Basic Metabolic Panel: Recent Labs  Lab 08/08/23 2227 08/09/23 0615 08/09/23 1017 08/10/23 0631 08/11/23 1000 08/12/23 0716  NA 131* 131*  --  133* 133* 132*  K 2.3* 3.1*  --  3.5 4.1 3.6  CL 89* 98  --  99 99 97*  CO2 22 20*  --  24 20* 26  GLUCOSE 79 89  --  98 109* 87  BUN 52* 46*  --  27* 16 10  CREATININE 3.33* 2.73*  --  1.38* 1.20* 1.13*  CALCIUM  9.1 7.7*  --  7.9* 8.1* 8.6*  MG 1.3* 1.2*   --  2.9* 2.0 1.6*  PHOS  --   --  2.9 2.0*  --   --    Liver Function Tests: Recent Labs  Lab 08/08/23 2227 08/09/23 0615  AST 53* 38  ALT 39 28  ALKPHOS 55 42  BILITOT 1.2 1.1  PROT 8.1 6.3*  ALBUMIN 4.1 3.2*   Recent Labs  Lab 08/08/23 2227  LIPASE 33   Recent Labs  Lab 08/08/23 2227  AMMONIA <10   CBC: Recent Labs  Lab 08/08/23 2227 08/09/23 0615 08/09/23 1017 08/12/23 0716  WBC 16.7* 14.3* 13.7* 9.7  NEUTROABS 12.4*  --   --   --   HGB 13.8 11.7* 12.8 10.9*  HCT 38.6 34.4* 39.0 32.1*  MCV 88.1 89.6 93.1 92.5  PLT 270 207 188 218   Cardiac Enzymes: No results for input(s): "CKTOTAL", "CKMB", "CKMBINDEX", "TROPONINI" in the last 168 hours. BNP: Invalid input(s): "POCBNP" CBG: No results for input(s): "GLUCAP" in the last 168 hours. D-Dimer No results for input(s): "DDIMER" in the last 72 hours. Hgb A1c No results for input(s): "HGBA1C" in the last 72 hours. Lipid Profile Recent Labs    08/12/23 0716  TRIG 45   Thyroid function studies No results for input(s): "TSH", "T4TOTAL", "T3FREE", "THYROIDAB" in the last 72 hours.  Invalid input(s): "FREET3" Anemia work up No results for input(s): "VITAMINB12", "FOLATE", "FERRITIN", "TIBC", "IRON", "RETICCTPCT" in the last 72 hours. Urinalysis    Component Value Date/Time   COLORURINE STRAW (A) 08/23/2022 1843   APPEARANCEUR CLEAR (A) 08/23/2022 1843   LABSPEC 1.010 08/23/2022 1843   PHURINE 7.0 08/23/2022 1843   GLUCOSEU NEGATIVE 08/23/2022 1843   HGBUR NEGATIVE 08/23/2022 1843   BILIRUBINUR NEGATIVE 08/23/2022 1843   KETONESUR NEGATIVE 08/23/2022 1843   PROTEINUR NEGATIVE 08/23/2022 1843   NITRITE NEGATIVE 08/23/2022 1843   LEUKOCYTESUR NEGATIVE 08/23/2022 1843   Sepsis Labs Recent Labs  Lab 08/08/23 2227 08/09/23 0615 08/09/23 1017 08/12/23 0716  WBC 16.7* 14.3* 13.7* 9.7   Microbiology Recent Results (from the past 240 hour(s))  Culture, blood (routine x 2)     Status: None  (Preliminary result)   Collection Time: 08/08/23 10:27 PM   Specimen: BLOOD  Result Value Ref Range Status   Specimen Description BLOOD SITE NOT SPECIFIED  Final   Special Requests   Final    BOTTLES DRAWN AEROBIC AND ANAEROBIC Blood Culture adequate volume   Culture   Final    NO GROWTH 4 DAYS Performed at [facility], [address] [place], [place] [address]    Report Status PENDING  Incomplete  Resp panel by RT-PCR (RSV, Flu A&B, Covid) Anterior Nasal Swab     Status: None   Collection Time: 08/09/23 12:24 AM   Specimen: Anterior Nasal Swab  Result Value Ref Range Status   SARS Coronavirus 2 by RT PCR NEGATIVE NEGATIVE Final   Influenza A by PCR NEGATIVE NEGATIVE Final   Influenza B by PCR NEGATIVE NEGATIVE Final    Comment: (NOTE) The Xpert Xpress SARS-CoV-2/FLU/RSV plus assay is intended as an aid in the diagnosis of influenza from Nasopharyngeal swab specimens and should not be used as a sole basis for treatment. Nasal washings and aspirates are unacceptable for Xpert Xpress SARS-CoV-2/FLU/RSV testing.  Fact Sheet for Patients: BloggerCourse.com  Fact Sheet for Healthcare Providers: SeriousBroker.it  This test is not yet approved or cleared by the [place] FDA  and has been authorized for detection and/or diagnosis of SARS-CoV-2 by FDA under an Emergency Use Authorization (EUA). This EUA will remain in effect (meaning this test can be used) for the duration of the COVID-19 declaration under Section 564(b)(1) of the Act, 21 U.S.C. section 360bbb-3(b)(1), unless the authorization is terminated or revoked.     Resp Syncytial Virus by PCR NEGATIVE NEGATIVE Final    Comment: (NOTE) Fact Sheet for Patients: BloggerCourse.com  Fact Sheet for Healthcare Providers: SeriousBroker.it  This test is not yet approved or cleared by the [place] FDA and has been  authorized for detection and/or diagnosis of SARS-CoV-2 by FDA under an Emergency Use Authorization (EUA). This EUA will remain in effect (meaning this test can be used) for the duration of the COVID-19 declaration under Section 564(b)(1) of the Act, 21 U.S.C. section 360bbb-3(b)(1), unless the authorization is terminated or revoked.  Performed at [facility], [address] [place], [place] [address]   Culture, blood (routine x 2)     Status: None (Preliminary result)   Collection Time: 08/09/23  6:15 AM   Specimen: BLOOD  Result Value Ref Range Status   Specimen Description BLOOD BLOOD RIGHT HAND  Final   Special Requests   Final    AEROBIC BOTTLE ONLY Blood Culture results may not be optimal due to an inadequate volume of blood received in culture bottles   Culture   Final    NO GROWTH 3 DAYS Performed at [facility], [address], [place], [place] [address]    Report Status PENDING  Incomplete     Time coordinating discharge:  35 minutes  SIGNED:   Dorcas Carrow, MD  [facility] 08/12/2023, 10:44 AM

## 2023-08-13 LAB — CULTURE, BLOOD (ROUTINE X 2)
Culture: NO GROWTH
Special Requests: ADEQUATE

## 2023-08-14 LAB — CULTURE, BLOOD (ROUTINE X 2): Culture: NO GROWTH

## 2023-08-18 DIAGNOSIS — K859 Acute pancreatitis without necrosis or infection, unspecified: Secondary | ICD-10-CM

## 2023-08-18 DIAGNOSIS — E785 Hyperlipidemia, unspecified: Secondary | ICD-10-CM | POA: Diagnosis not present

## 2023-08-18 DIAGNOSIS — I10 Essential (primary) hypertension: Secondary | ICD-10-CM | POA: Diagnosis not present

## 2023-08-18 DIAGNOSIS — M25511 Pain in right shoulder: Secondary | ICD-10-CM | POA: Diagnosis not present

## 2023-08-18 DIAGNOSIS — I5032 Chronic diastolic (congestive) heart failure: Secondary | ICD-10-CM | POA: Diagnosis not present

## 2023-08-22 DIAGNOSIS — I10 Essential (primary) hypertension: Secondary | ICD-10-CM

## 2023-08-22 DIAGNOSIS — E785 Hyperlipidemia, unspecified: Secondary | ICD-10-CM

## 2023-08-22 DIAGNOSIS — K219 Gastro-esophageal reflux disease without esophagitis: Secondary | ICD-10-CM

## 2023-08-22 DIAGNOSIS — G9341 Metabolic encephalopathy: Secondary | ICD-10-CM

## 2023-08-22 DIAGNOSIS — I5032 Chronic diastolic (congestive) heart failure: Secondary | ICD-10-CM

## 2023-08-22 DIAGNOSIS — N179 Acute kidney failure, unspecified: Secondary | ICD-10-CM

## 2023-08-22 DIAGNOSIS — K859 Acute pancreatitis without necrosis or infection, unspecified: Secondary | ICD-10-CM

## 2023-08-22 DIAGNOSIS — M25511 Pain in right shoulder: Secondary | ICD-10-CM

## 2023-08-22 DIAGNOSIS — D509 Iron deficiency anemia, unspecified: Secondary | ICD-10-CM

## 2023-09-12 ENCOUNTER — Encounter: Payer: Self-pay | Admitting: Intensive Care

## 2023-09-12 ENCOUNTER — Inpatient Hospital Stay
Admission: EM | Admit: 2023-09-12 | Discharge: 2023-09-16 | DRG: 683 | Disposition: A | Discharge status: Home Health | Payer: Medicare PPO | Attending: Student in an Organized Health Care Education/Training Program | Admitting: Student in an Organized Health Care Education/Training Program

## 2023-09-12 ENCOUNTER — Emergency Department: Payer: Medicare PPO

## 2023-09-12 ENCOUNTER — Other Ambulatory Visit: Payer: Self-pay

## 2023-09-12 DIAGNOSIS — E878 Other disorders of electrolyte and fluid balance, not elsewhere classified: Secondary | ICD-10-CM | POA: Diagnosis present

## 2023-09-12 DIAGNOSIS — R7989 Other specified abnormal findings of blood chemistry: Secondary | ICD-10-CM | POA: Diagnosis present

## 2023-09-12 DIAGNOSIS — N1831 Chronic kidney disease, stage 3a: Secondary | ICD-10-CM | POA: Diagnosis present

## 2023-09-12 DIAGNOSIS — I5032 Chronic diastolic (congestive) heart failure: Secondary | ICD-10-CM | POA: Diagnosis present

## 2023-09-12 DIAGNOSIS — E876 Hypokalemia: Secondary | ICD-10-CM | POA: Diagnosis present

## 2023-09-12 DIAGNOSIS — E872 Acidosis, unspecified: Secondary | ICD-10-CM | POA: Diagnosis present

## 2023-09-12 DIAGNOSIS — F0393 Unspecified dementia, unspecified severity, with mood disturbance: Secondary | ICD-10-CM | POA: Diagnosis present

## 2023-09-12 DIAGNOSIS — K219 Gastro-esophageal reflux disease without esophagitis: Secondary | ICD-10-CM | POA: Diagnosis present

## 2023-09-12 DIAGNOSIS — Z9012 Acquired absence of left breast and nipple: Secondary | ICD-10-CM | POA: Diagnosis not present

## 2023-09-12 DIAGNOSIS — I130 Hypertensive heart and chronic kidney disease with heart failure and stage 1 through stage 4 chronic kidney disease, or unspecified chronic kidney disease: Secondary | ICD-10-CM | POA: Diagnosis present

## 2023-09-12 DIAGNOSIS — Z1152 Encounter for screening for COVID-19: Secondary | ICD-10-CM | POA: Diagnosis not present

## 2023-09-12 DIAGNOSIS — Z853 Personal history of malignant neoplasm of breast: Secondary | ICD-10-CM

## 2023-09-12 DIAGNOSIS — Z881 Allergy status to other antibiotic agents status: Secondary | ICD-10-CM

## 2023-09-12 DIAGNOSIS — R112 Nausea with vomiting, unspecified: Secondary | ICD-10-CM | POA: Diagnosis present

## 2023-09-12 DIAGNOSIS — D72829 Elevated white blood cell count, unspecified: Secondary | ICD-10-CM | POA: Diagnosis not present

## 2023-09-12 DIAGNOSIS — E039 Hypothyroidism, unspecified: Secondary | ICD-10-CM | POA: Diagnosis present

## 2023-09-12 DIAGNOSIS — E860 Dehydration: Secondary | ICD-10-CM | POA: Diagnosis present

## 2023-09-12 DIAGNOSIS — E871 Hypo-osmolality and hyponatremia: Secondary | ICD-10-CM | POA: Diagnosis present

## 2023-09-12 DIAGNOSIS — Z79899 Other long term (current) drug therapy: Secondary | ICD-10-CM

## 2023-09-12 DIAGNOSIS — A419 Sepsis, unspecified organism: Principal | ICD-10-CM

## 2023-09-12 DIAGNOSIS — Z66 Do not resuscitate: Secondary | ICD-10-CM | POA: Diagnosis present

## 2023-09-12 DIAGNOSIS — Z7982 Long term (current) use of aspirin: Secondary | ICD-10-CM

## 2023-09-12 DIAGNOSIS — Z79890 Hormone replacement therapy (postmenopausal): Secondary | ICD-10-CM | POA: Diagnosis not present

## 2023-09-12 DIAGNOSIS — E785 Hyperlipidemia, unspecified: Secondary | ICD-10-CM | POA: Diagnosis present

## 2023-09-12 DIAGNOSIS — N189 Chronic kidney disease, unspecified: Secondary | ICD-10-CM | POA: Diagnosis not present

## 2023-09-12 DIAGNOSIS — Z90710 Acquired absence of both cervix and uterus: Secondary | ICD-10-CM

## 2023-09-12 DIAGNOSIS — D72828 Other elevated white blood cell count: Secondary | ICD-10-CM | POA: Diagnosis present

## 2023-09-12 DIAGNOSIS — Z885 Allergy status to narcotic agent status: Secondary | ICD-10-CM | POA: Diagnosis not present

## 2023-09-12 DIAGNOSIS — Z8249 Family history of ischemic heart disease and other diseases of the circulatory system: Secondary | ICD-10-CM | POA: Diagnosis not present

## 2023-09-12 DIAGNOSIS — Z884 Allergy status to anesthetic agent status: Secondary | ICD-10-CM | POA: Diagnosis not present

## 2023-09-12 DIAGNOSIS — Z91040 Latex allergy status: Secondary | ICD-10-CM

## 2023-09-12 DIAGNOSIS — F32A Depression, unspecified: Secondary | ICD-10-CM | POA: Diagnosis present

## 2023-09-12 DIAGNOSIS — N179 Acute kidney failure, unspecified: Principal | ICD-10-CM | POA: Diagnosis present

## 2023-09-12 LAB — CBC
HCT: 41.4 % (ref 36.0–46.0)
Hemoglobin: 15 g/dL (ref 12.0–15.0)
MCH: 30.7 pg (ref 26.0–34.0)
MCHC: 36.2 g/dL — ABNORMAL HIGH (ref 30.0–36.0)
MCV: 84.8 fL (ref 80.0–100.0)
Platelets: 244 10*3/uL (ref 150–400)
RBC: 4.88 MIL/uL (ref 3.87–5.11)
RDW: 12.1 % (ref 11.5–15.5)
WBC: 25.3 10*3/uL — ABNORMAL HIGH (ref 4.0–10.5)
nRBC: 0 % (ref 0.0–0.2)

## 2023-09-12 LAB — URINALYSIS, ROUTINE W REFLEX MICROSCOPIC
Bacteria, UA: NONE SEEN
Bilirubin Urine: NEGATIVE
Glucose, UA: NEGATIVE mg/dL
Hgb urine dipstick: NEGATIVE
Ketones, ur: 5 mg/dL — AB
Leukocytes,Ua: NEGATIVE
Nitrite: NEGATIVE
Protein, ur: 100 mg/dL — AB
Specific Gravity, Urine: 1.012 (ref 1.005–1.030)
pH: 7 (ref 5.0–8.0)

## 2023-09-12 LAB — TROPONIN I (HIGH SENSITIVITY)
Troponin I (High Sensitivity): 49 ng/L — ABNORMAL HIGH (ref <18)
Troponin I (High Sensitivity): 86 ng/L — ABNORMAL HIGH (ref <18)

## 2023-09-12 LAB — PHOSPHORUS: Phosphorus: 1.2 mg/dL — ABNORMAL LOW (ref 2.5–4.6)

## 2023-09-12 LAB — COMPREHENSIVE METABOLIC PANEL
ALT: UNDETERMINED U/L (ref 0–44)
AST: 64 U/L — ABNORMAL HIGH (ref 15–41)
Albumin: 4.7 g/dL (ref 3.5–5.0)
Alkaline Phosphatase: 70 U/L (ref 38–126)
Anion gap: 21 — ABNORMAL HIGH (ref 5–15)
BUN: 14 mg/dL (ref 8–23)
CO2: 17 mmol/L — ABNORMAL LOW (ref 22–32)
Calcium: 10 mg/dL (ref 8.9–10.3)
Chloride: 86 mmol/L — ABNORMAL LOW (ref 98–111)
Creatinine, Ser: 1.52 mg/dL — ABNORMAL HIGH (ref 0.44–1.00)
GFR, Estimated: 35 mL/min — ABNORMAL LOW (ref >60)
Glucose, Bld: 149 mg/dL — ABNORMAL HIGH (ref 70–99)
Potassium: 3.2 mmol/L — ABNORMAL LOW (ref 3.5–5.1)
Sodium: 124 mmol/L — ABNORMAL LOW (ref 135–145)
Total Bilirubin: 1.6 mg/dL — ABNORMAL HIGH (ref 0.3–1.2)
Total Protein: 9.4 g/dL — ABNORMAL HIGH (ref 6.5–8.1)

## 2023-09-12 LAB — LIPASE, BLOOD: Lipase: 25 U/L (ref 11–51)

## 2023-09-12 LAB — ALT: ALT: 25 U/L (ref 0–44)

## 2023-09-12 LAB — MAGNESIUM: Magnesium: 1.3 mg/dL — ABNORMAL LOW (ref 1.7–2.4)

## 2023-09-12 MED ORDER — ACETAMINOPHEN 325 MG PO TABS
650.0000 mg | ORAL_TABLET | Freq: Four times a day (QID) | ORAL | Status: DC | PRN
  Administered 2023-09-13 – 2023-09-16 (×3): 650 mg via ORAL
  Filled 2023-09-12 (×3): qty 2

## 2023-09-12 MED ORDER — PANTOPRAZOLE SODIUM 40 MG PO TBEC: 40.0000 mg | DELAYED_RELEASE_TABLET | Freq: Every day | ORAL | Status: DC

## 2023-09-12 MED ORDER — VANCOMYCIN HCL IN DEXTROSE 1-5 GM/200ML-% IV SOLN
1000.0000 mg | Freq: Once | INTRAVENOUS | Status: AC
  Administered 2023-09-13: 1000 mg via INTRAVENOUS
  Filled 2023-09-12: qty 200

## 2023-09-12 MED ORDER — SODIUM CHLORIDE 0.9 % IV BOLUS
500.0000 mL | Freq: Once | INTRAVENOUS | Status: AC
  Administered 2023-09-12: 500 mL via INTRAVENOUS

## 2023-09-12 MED ORDER — ATORVASTATIN CALCIUM 20 MG PO TABS
80.0000 mg | ORAL_TABLET | Freq: Every day | ORAL | Status: DC
  Administered 2023-09-13 – 2023-09-16 (×4): 80 mg via ORAL
  Filled 2023-09-12 (×4): qty 4

## 2023-09-12 MED ORDER — DIPHENHYDRAMINE-APAP (SLEEP) 25-500 MG PO TABS: 1.0000 | ORAL_TABLET | Freq: Every evening | ORAL | Status: DC | PRN

## 2023-09-12 MED ORDER — IOHEXOL 300 MG/ML  SOLN
75.0000 mL | Freq: Once | INTRAMUSCULAR | Status: AC | PRN
  Administered 2023-09-12: 75 mL via INTRAVENOUS

## 2023-09-12 MED ORDER — AMLODIPINE BESYLATE 10 MG PO TABS
10.0000 mg | ORAL_TABLET | Freq: Every day | ORAL | Status: DC
  Administered 2023-09-13 – 2023-09-16 (×4): 10 mg via ORAL
  Filled 2023-09-12: qty 2
  Filled 2023-09-12 (×3): qty 1

## 2023-09-12 MED ORDER — METOCLOPRAMIDE HCL 5 MG/ML IJ SOLN
10.0000 mg | Freq: Four times a day (QID) | INTRAMUSCULAR | Status: DC | PRN
  Administered 2023-09-12: 10 mg via INTRAVENOUS
  Filled 2023-09-12: qty 2

## 2023-09-12 MED ORDER — SODIUM CHLORIDE 0.9 % IV SOLN
2.0000 g | Freq: Once | INTRAVENOUS | Status: AC
  Administered 2023-09-13: 2 g via INTRAVENOUS
  Filled 2023-09-12: qty 12.5

## 2023-09-12 MED ORDER — CLONIDINE HCL 0.1 MG PO TABS
0.1000 mg | ORAL_TABLET | Freq: Two times a day (BID) | ORAL | Status: DC
  Administered 2023-09-13 – 2023-09-16 (×8): 0.1 mg via ORAL
  Filled 2023-09-12 (×8): qty 1

## 2023-09-12 MED ORDER — FERROUS SULFATE 325 (65 FE) MG PO TABS
325.0000 mg | ORAL_TABLET | Freq: Every day | ORAL | Status: DC
  Administered 2023-09-13 – 2023-09-16 (×4): 325 mg via ORAL
  Filled 2023-09-12 (×4): qty 1

## 2023-09-12 MED ORDER — PANTOPRAZOLE SODIUM 40 MG IV SOLR
40.0000 mg | Freq: Two times a day (BID) | INTRAVENOUS | Status: DC
  Administered 2023-09-12 – 2023-09-15 (×6): 40 mg via INTRAVENOUS
  Filled 2023-09-12 (×6): qty 10

## 2023-09-12 MED ORDER — MAGNESIUM HYDROXIDE 400 MG/5ML PO SUSP: 30.0000 mL | Freq: Every day | ORAL | Status: DC | PRN

## 2023-09-12 MED ORDER — TRAZODONE HCL 50 MG PO TABS
25.0000 mg | ORAL_TABLET | Freq: Every evening | ORAL | Status: DC | PRN
  Administered 2023-09-13 – 2023-09-14 (×3): 25 mg via ORAL
  Filled 2023-09-12 (×3): qty 1

## 2023-09-12 MED ORDER — POTASSIUM CHLORIDE CRYS ER 20 MEQ PO TBCR
20.0000 meq | EXTENDED_RELEASE_TABLET | Freq: Every day | ORAL | Status: DC
  Administered 2023-09-13 – 2023-09-16 (×5): 20 meq via ORAL
  Filled 2023-09-12 (×5): qty 1

## 2023-09-12 MED ORDER — FLUOXETINE HCL 20 MG PO CAPS
20.0000 mg | ORAL_CAPSULE | Freq: Every day | ORAL | Status: DC
  Administered 2023-09-13 – 2023-09-16 (×4): 20 mg via ORAL
  Filled 2023-09-12 (×4): qty 1

## 2023-09-12 MED ORDER — ENOXAPARIN SODIUM 40 MG/0.4ML IJ SOSY: 40.0000 mg | PREFILLED_SYRINGE | INTRAMUSCULAR | Status: DC

## 2023-09-12 MED ORDER — ACETAMINOPHEN 650 MG RE SUPP: 650.0000 mg | Freq: Four times a day (QID) | RECTAL | Status: DC | PRN

## 2023-09-12 MED ORDER — POTASSIUM CHLORIDE IN NACL 20-0.9 MEQ/L-% IV SOLN
INTRAVENOUS | Status: DC
  Filled 2023-09-12: qty 1000

## 2023-09-12 MED ORDER — METOPROLOL SUCCINATE ER 50 MG PO TB24
50.0000 mg | ORAL_TABLET | Freq: Two times a day (BID) | ORAL | Status: DC
  Administered 2023-09-13 – 2023-09-16 (×8): 50 mg via ORAL
  Filled 2023-09-12 (×8): qty 1

## 2023-09-12 MED ORDER — VITAMIN D 25 MCG (1000 UNIT) PO TABS
1000.0000 [IU] | ORAL_TABLET | Freq: Every day | ORAL | Status: DC
  Administered 2023-09-13 – 2023-09-16 (×4): 1000 [IU] via ORAL
  Filled 2023-09-12 (×4): qty 1

## 2023-09-12 MED ORDER — METRONIDAZOLE 500 MG/100ML IV SOLN
500.0000 mg | Freq: Once | INTRAVENOUS | Status: AC
  Administered 2023-09-13: 500 mg via INTRAVENOUS
  Filled 2023-09-12: qty 100

## 2023-09-12 NOTE — ED Triage Notes (Signed)
Patient arrived by EMS from home with N/V and dizziness since yesterday. Any movement causes N/V. C/o chills. Son reports she is not at baseline even with dementia.   History dementia  EMS vitals: 199/93 b/p 146 CBG 97HR 99% RA

## 2023-09-12 NOTE — H&P (Signed)
[place]   PATIENT NAME: Cindy Huerta    MR#:  [id number]  DATE OF BIRTH:  July 17, 1946  DATE OF ADMISSION:  09/12/2023  PRIMARY CARE PHYSICIAN: Eloisa Northern, MD   Patient is coming from: Home  REQUESTING/REFERRING PHYSICIAN: Artis Delay, MD  CHIEF COMPLAINT:   Chief Complaint  Patient presents with   Emesis    HISTORY OF PRESENT ILLNESS:  Cindy Huerta is a 77 y.o. female with medical history significant for hypertension, stage IIIa CKD, CHF with preserved EF, depression, dementia, hypothyroidism, GERD and depression who presented to the emergency room with acute onset of recurrent nausea and vomiting.  No reported abdominal pain or diarrhea or melena or bright red blood per rectum.  No chest pain or palpitations were reported.  No reported cough or wheezing or dyspnea.  No reported dysuria, oliguria or hematuria or flank pain.  The patient is fairly demented and therefore very poor historian.  ED Course: When she came to the ER, BP was 199/101 with otherwise normal vital signs.  Labs revealed hyponatremia 124 hypochloremia of 86 with hypokalemia of 3.2 and CO2 17 with a blood glucose of 149 creatinine 1.52 and an gap of 21.  Phosphorus 1.2 magnesium 1.3 AST 64 total protein 9.4 with albumin 4.7.  Total bili was 1.6.  High sensitive troponin was 49 and later 8 6.  Lactic acid was 3.9 and CBC showed leukocytosis 25.3.  Urinalysis showed 0-5 WBCs no bacteria and 100 protein.  Blood cultures were drawn. EKG as reviewed by me : EKG showed sinus tachycardia with a rate of 94 with multiple premature complexes ventricular and supraventricular, left axis deviation and prolonged QT interval with QTc 524 MS. Imaging: Not contrast head CT scan revealed stable senescent changes with no acute intracranial abnormalities.  Abdominal pelvic CT scan with contrast revealed right perinephric edema with enhancement of the right renal collecting system and ureter suspicious for UTI and chronic  diverticulosis without diverticulitis, stable hiatal hernia and aortic atherosclerosis.  The patient was given 500 mL IV normal saline bolus.  She will be admitted to a medical telemetry bed for further evaluation and management. PAST MEDICAL HISTORY:   Past Medical History:  Diagnosis Date   Arthritis    Breast cancer (HCC)    Depression    GERD (gastroesophageal reflux disease)    HLD (hyperlipidemia)    Hypertension    Hypothyroidism     PAST SURGICAL HISTORY:   Past Surgical History:  Procedure Laterality Date   ABDOMINAL HYSTERECTOMY  1982   AXILLARY SENTINEL NODE BIOPSY Left 11/20/2021   Procedure: AXILLARY SENTINEL NODE BIOPSY;  Surgeon: Sung Amabile, DO;  Location: [facility] ORS;  Service: General;  Laterality: Left;   BREAST BIOPSY Left 09/23/2021   3:00 13cmfn venus marker, path pending   BREAST BIOPSY Left 09/23/2021   3:00 14 cmfn heart marker, path pending   CHOLECYSTECTOMY     ESOPHAGOGASTRODUODENOSCOPY (EGD) WITH PROPOFOL N/A 10/08/2021   Procedure: ESOPHAGOGASTRODUODENOSCOPY (EGD) WITH PROPOFOL;  Surgeon: Wyline Mood, MD;  Location: [facility] END[facility]COPY;  Service: Gastroenterology;  Laterality: N/A;   HEMATOMA EVACUATION Left 11/21/2021   Procedure: EVACUATION HEMATOMA;  Surgeon: Sung Amabile, DO;  Location: [facility] ORS;  Service: General;  Laterality: Left;   IR FLUORO GUIDE CV LINE LEFT  08/17/2022   IR US GUIDE VASC ACCESS LEFT  08/17/2022   IR US GUIDE VASC ACCESS RIGHT  08/17/2022   left lumpectomy  2019   TOTAL MASTECTOMY Left  11/20/2021   Procedure: TOTAL MASTECTOMY;  Surgeon: Sung Amabile, DO;  Location: [facility] ORS;  Service: General;  Laterality: Left;    SOCIAL HISTORY:   Social History   Tobacco Use   Smoking status: Never   Smokeless tobacco: Never  Substance Use Topics   Alcohol use: Never    FAMILY HISTORY:   Family History  Problem Relation Age of Onset   Heart disease Mother    Cancer Paternal Grandmother     DRUG ALLERGIES:   Allergies   Allergen Reactions   Latex Rash   Novocain [Procaine] Other (See Comments)    Unsure - told by DDS not to let anyone give it to her  Confusion    Zestril [Lisinopril] Cough   Benadryl [Diphenhydramine] Other (See Comments)    Jitteriness Agitation   Biaxin [Clarithromycin] Other (See Comments)    Confusion     Roxicodone [Oxycodone] Nausea And Vomiting and Anxiety    REVIEW OF SYSTEMS:   ROS As per history of present illness. All pertinent systems were reviewed above. Constitutional, HEENT, cardiovascular, respiratory, GI, GU, musculoskeletal, neuro, psychiatric, endocrine, integumentary and hematologic systems were reviewed and are otherwise negative/unremarkable except for positive findings mentioned above in the HPI.   MEDICATIONS AT HOME:   Prior to Admission medications   Medication Sig Start Date End Date Taking? Authorizing Provider  acetaminophen (TYLENOL) 500 MG tablet Take 500-1,000 mg by mouth every 6 (six) hours as needed for moderate pain.    [provider]  amLODipine (NORVASC) 10 MG tablet Take 1 tablet (10 mg total) by mouth daily. 08/25/22   Amin, Ankit C, MD  ARTIFICIAL TEARS 0.1-0.3 % SOLN Place 1 drop into both eyes daily as needed for dry eyes. 07/15/22   [provider]  aspirin 81 MG EC tablet Take 1 tablet (81 mg total) by mouth daily. RESTART 48HRS AFTER DISCHARGE 11/25/21   Tonna Boehringer, Isami, DO  atorvastatin (LIPITOR) 80 MG tablet Take 80 mg by mouth daily. 09/21/21   [provider]  Cholecalciferol (VITAMIN D-3 PO) Take 1 capsule by mouth daily.    [provider]  cloNIDine (CATAPRES) 0.1 MG tablet Take 0.1 mg by mouth 2 (two) times daily.    [provider]  diphenhydramine-acetaminophen (TYLENOL PM) 25-500 MG TABS tablet Take 1 tablet by mouth at bedtime as needed (Pain).    [provider]  Ferrous Sulfate (IRON PO) Take 1 tablet by mouth daily.    [provider]  FLUoxetine (PROZAC) 20 MG  capsule Take 20 mg by mouth daily.    [provider]  fluticasone (FLONASE) 50 MCG/ACT nasal spray Place 1 spray into both nostrils daily as needed for allergies or rhinitis.    [provider]  levothyroxine (SYNTHROID) 75 MCG tablet Take 75 mcg by mouth daily before breakfast. 02/10/21   [provider]  magnesium oxide (MAG-OX) 400 MG tablet Take 1 tablet (400 mg total) by mouth daily. 07/30/22   Rolly Salter, MD  metoprolol succinate (TOPROL-XL) 50 MG 24 hr tablet Take 50 mg by mouth 2 (two) times daily. 03/18/21   [provider]  pantoprazole (PROTONIX) 40 MG tablet Take 1 tablet (40 mg total) by mouth daily. 10/10/21 08/09/23  Lolita Patella B, MD  potassium chloride SA (KLOR-CON M) 20 MEQ tablet Take 20 mEq by mouth daily.    [provider]  trolamine salicylate (ASPERCREME) 10 % cream Apply 1 application topically as needed for muscle pain.  [provider]      VITAL SIGNS:  Blood pressure (!) 172/83, pulse 87, temperature 98 F (36.7 C), temperature source Axillary, resp. rate (!) 22, SpO2 100%.  PHYSICAL EXAMINATION:  Physical Exam  GENERAL:  77 y.o.-year-old African-American female patient lying in the bed with no acute distress.  EYES: Pupils equal, round, reactive to light and accommodation. No scleral icterus. Extraocular muscles intact.  HEENT: Head atraumatic, normocephalic. Oropharynx and nasopharynx clear.  NECK:  Supple, no jugular venous distention. No thyroid enlargement, no tenderness.  LUNGS: Normal breath sounds bilaterally, no wheezing, rales,rhonchi or crepitation. No use of accessory muscles of respiration.  CARDIOVASCULAR: Regular rate and rhythm, S1, S2 normal. No murmurs, rubs, or gallops.  ABDOMEN: Soft, nondistended, nontender. Bowel sounds present. No organomegaly or mass.  EXTREMITIES: No pedal edema, cyanosis, or clubbing.  NEUROLOGIC: Cranial nerves II through XII are intact. Muscle strength 5/5  in all extremities. Sensation intact. Gait not checked.  PSYCHIATRIC: The patient is fairly anxious and was tremulous.  No good eye contact. SKIN: No obvious rash, lesion, or ulcer.   LABORATORY PANEL:   CBC Recent Labs  Lab 09/12/23 1951  WBC 25.3*  HGB 15.0  HCT 41.4  PLT 244   ------------------------------------------------------------------------------------------------------------------  Chemistries  Recent Labs  Lab 09/12/23 1951 09/12/23 2209  NA 124*  --   K 3.2*  --   CL 86*  --   CO2 17*  --   GLUCOSE 149*  --   BUN 14  --   CREATININE 1.52*  --   CALCIUM 10.0  --   MG  --  1.3*  AST 64*  --   ALT QUANTITY NOT SUFFICIENT, UNABLE TO PERFORM TEST 25  ALKPHOS 70  --   BILITOT 1.6*  --    ------------------------------------------------------------------------------------------------------------------  Cardiac Enzymes No results for input(s): "TROPONINI" in the last 168 hours. ------------------------------------------------------------------------------------------------------------------  RADIOLOGY:  CT ABDOMEN PELVIS W CONTRAST  Result Date: 09/12/2023 CLINICAL DATA:  Acute abdominal pain.  Nausea and vomiting. EXAM: CT ABDOMEN AND PELVIS WITH CONTRAST TECHNIQUE: Multidetector CT imaging of the abdomen and pelvis was performed using the standard protocol following bolus administration of intravenous contrast. RADIATION DOSE REDUCTION: This exam was performed according to the departmental dose-optimization program which includes automated exposure control, adjustment of the mA and/or kV according to patient size and/or use of iterative reconstruction technique. CONTRAST:  75mL OMNIPAQUE IOHEXOL 300 MG/ML  SOLN COMPARISON:  CT 08/08/2023 FINDINGS: Lower chest: Stable hiatal hernia.  No basilar airspace disease. Hepatobiliary: Small cyst in the left lobe of the liver, not seen on prior in the absence of IV contrast. Clips in the gallbladder fossa  postcholecystectomy. No biliary dilatation. Pancreas: No ductal dilatation or inflammation. Previous stranding adjacent to the pancreatic head has resolved. Spleen: Normal in size without focal abnormality. Adrenals/Urinary Tract: No adrenal nodule. No hydronephrosis. There is right perinephric edema. Enhancement of the right renal collecting system and ureter. No renal fluid collection. No urolithiasis. No bladder wall thickening. Stomach/Bowel: Again seen hiatal hernia. No bowel obstruction or inflammation. Colonic diverticulosis, prominent in the descending colon. No diverticulitis. The appendix not visualized. Vascular/Lymphatic: Aortic atherosclerosis. No aneurysm. Patent portal vein. No adenopathy. Reproductive: Status post hysterectomy. No adnexal masses. Other: No free air, free fluid, or intra-abdominal fluid collection. Musculoskeletal: Chronic degenerative change in the spine. There are no acute or suspicious osseous abnormalities. IMPRESSION: 1. Right perinephric edema with enhancement of the right renal collecting system and ureter, suspicious for urinary tract infection.  No renal fluid collection. 2. Colonic diverticulosis without diverticulitis. 3. Stable hiatal hernia. Aortic Atherosclerosis (ICD10-I70.0). Electronically Signed   By: Narda Rutherford M.D.   On: 09/12/2023 21:59   CT HEAD WO CONTRAST ( )  Result Date: 09/12/2023 CLINICAL DATA:  Headache, new onset (Age >= 51y), nausea, vomiting EXAM: CT HEAD WITHOUT CONTRAST TECHNIQUE: Contiguous axial images were obtained from the base of the skull through the vertex without intravenous contrast. RADIATION DOSE REDUCTION: This exam was performed according to the departmental dose-optimization program which includes automated exposure control, adjustment of the mA and/or kV according to patient size and/or use of iterative reconstruction technique. COMPARISON:  08/08/2023 FINDINGS: Brain: Normal anatomic configuration. Parenchymal volume loss is  commensurate with the patient's age. Stable moderate periventricular white matter changes are present likely reflecting the sequela of small vessel ischemia. No abnormal intra or extra-axial mass lesion or fluid collection. No abnormal mass effect or midline shift. No evidence of acute intracranial hemorrhage or infarct. Ventricular size is normal. Cerebellum unremarkable. Vascular: No asymmetric hyperdense vasculature at the skull base. Skull: Intact Sinuses/Orbits: Paranasal sinuses are clear. Orbits are unremarkable. Other: Mastoid air cells and middle ear cavities are clear. IMPRESSION: 1. No acute intracranial hemorrhage or infarct. 2. Stable senescent change. Electronically Signed   By: Helyn Numbers M.D.   On: 09/12/2023 21:58      IMPRESSION AND PLAN:  Assessment and Plan: * Hyponatremia - The patient was admitted to medical telemetry bed. - We will continue additional IV normal saline. - Will follow hyponatremia workup.  Acute kidney injury superimposed on chronic kidney disease (HCC) - The patient better with IV normal saline. - We will wait nephrotoxins. - Will follow BMPs.  Leukocytosis - This is associated with elevated lactic acid. - We will need to rule out sepsis. - Will follow blood cultures. - Will place her on broad-spectrum IV antibiotic therapy with vancomycin, cefepime and Flagyl for now.  Hypokalemia - This associated hypomagnesemia. - We will replace potassium and magnesium.  Elevated troponin - This could be related to demand ischemia. - The patient  had no reported chest pain. - We will follow serial troponins.   DVT prophylaxis: Lovenox.  Advanced Care Planning:  Code Status: The is DNR and DNI. Family Communication:  The plan of care was discussed in details with the patient (and family). I answered all questions. The patient agreed to proceed with the above mentioned plan. Further management will depend upon hospital course. Disposition Plan: Back to  previous home environment Consults called: none.  All the records are reviewed and case discussed with ED provider.  Status is: Inpatient  At the time of the admission, it appears that the appropriate admission status for this patient is inpatient.  This is judged to be reasonable and necessary in order to provide the required intensity of service to ensure the patient's safety given the presenting symptoms, physical exam findings and initial radiographic and laboratory data in the context of comorbid conditions.  The patient requires inpatient status due to high intensity of service, high risk of further deterioration and high frequency of surveillance required.  I certify that at the time of admission, it is my clinical judgment that the patient will require inpatient hospital care extending more than 2 midnights.                            Dispo: The patient is from: Home  Anticipated d/c is to: Home              Patient currently is not medically stable to d/c.              Difficult to place patient: No  Hannah Beat M.D on 09/13/2023 at 1:23 AM  [facility]   From 7 PM-7 AM, contact night-coverage [email or web address]  CC: Primary care physician; Eloisa Northern, MD

## 2023-09-12 NOTE — Progress Notes (Signed)
PHARMACY -  BRIEF ANTIBIOTIC NOTE   Pharmacy has received consult(s) for Vanc, Cefepime from an ED provider.  The patient's profile has been reviewed for ht/wt/allergies/indication/available labs.    One time order(s) placed for Vanc 1 gm IV X 1 and Cefepime 2 gm IV X 1  Further antibiotics/pharmacy consults should be ordered by admitting physician if indicated.                       Thank you, Moshe Wenger D 09/12/2023  10:56 PM

## 2023-09-12 NOTE — ED Provider Notes (Signed)
[facility] Provider Note    Event Date/Time   First MD Initiated Contact with Patient 09/12/23 1907     (approximate)   History   Emesis   HPI  Cindy Huerta is a 77 y.o. female  HTN, HLD, hypothyroidism, GERD, CKD stage IIIa, GI bleed, TIA, CHF with preserved EF, depression, breast cancer, cholecystectomy comes in with nausea, vomiting.  I reviewed the notes were patient was admitted on 9/2 for syncope had a CT head CT abdomen pelvis that showed acute pancreatitis she is treated for IV fluids and nausea medicine.  Patient also noted to have prolonged QTc and these were electrolyte abnormalities were replaced.  Patient has a history of dementia but was reportedly seems a little bit more off than normal.  She has no ability to tell me what she is feeling but according to the EMS note it sounds like she has had some nausea and vomiting and according the son was not acting her normal self.  Son was not in the room when I went to the room on multiple attempts to see the patient.   Physical Exam   Triage Vital Signs: ED Triage Vitals [09/12/23 1843]  Encounter Vitals Group     BP (!) 199/101     Systolic BP Percentile      Diastolic BP Percentile      Pulse Rate 95     Resp 18     Temp 98 F (36.7 C)     Temp Source Axillary     SpO2 100 %     Weight      Height      Head Circumference      Peak Flow      Pain Score      Pain Loc      Pain Education      Exclude from Growth Chart     Most recent vital signs: Vitals:   09/12/23 1843  BP: (!) 199/101  Pulse: 95  Resp: 18  Temp: 98 F (36.7 C)  SpO2: 100%     General: Awake, no distress.  CV:  Good peripheral perfusion.  Resp:  Normal effort.  Abd:  No distention.  Slightly tender Other:  Patient is able to say words but not really making sense with the questions I am asking.   ED Results / Procedures / Treatments   Labs (all labs ordered are listed, but only abnormal results are  displayed) Labs Reviewed  LIPASE, BLOOD  COMPREHENSIVE METABOLIC PANEL  CBC  URINALYSIS, ROUTINE W REFLEX MICROSCOPIC  TROPONIN I (HIGH SENSITIVITY)     EKG  My interpretation of EKG:  Sinus tachycardia with diffuse ST depressions no ST elevation no T wave inversions,, normal intervals  Repeat remains sinus with diffuse ST depressions no ST elevation no T wave inversion does but does have prolonged QTc  RADIOLOGY I have reviewed the CT head personally interpreted no evidence of intracranial hemorrhage  PROCEDURES:  Critical Care performed: No  .1-3 Lead EKG Interpretation  Performed by: Concha Se, MD Authorized by: Concha Se, MD     Interpretation: normal     ECG rate:  80   ECG rate assessment: normal     Rhythm: sinus rhythm     Ectopy: none     Conduction: normal   .Critical Care  Performed by: Concha Se, MD Authorized by: Concha Se, MD   Critical care provider statement:  Critical care time (minutes):  30   Critical care was necessary to treat or prevent imminent or life-threatening deterioration of the following conditions:  Sepsis   Critical care was time spent personally by me on the following activities:  Development of treatment plan with patient or surrogate, discussions with consultants, evaluation of patient's response to treatment, examination of patient, ordering and review of laboratory studies, ordering and review of radiographic studies, ordering and performing treatments and interventions, pulse oximetry, re-evaluation of patient's condition and review of old charts    MEDICATIONS ORDERED IN ED: Medications  ceFEPIme (MAXIPIME) 2 g in sodium chloride 0.9 % 100 mL IVPB (has no administration in time range)  metroNIDAZOLE (FLAGYL) IVPB 500 mg (has no administration in time range)  vancomycin (VANCOCIN) IVPB 1000 mg/200 mL premix (has no administration in time range)  amLODipine (NORVASC) tablet 10 mg (has no administration in time  range)  atorvastatin (LIPITOR) tablet 80 mg (has no administration in time range)  cloNIDine (CATAPRES) tablet 0.1 mg (has no administration in time range)  metoprolol succinate (TOPROL-XL) 24 hr tablet 50 mg (has no administration in time range)  FLUoxetine (PROZAC) capsule 20 mg (has no administration in time range)  ferrous sulfate tablet 325 mg (has no administration in time range)  cholecalciferol (VITAMIN D3) 25 MCG (1000 UNIT) tablet 1,000 Units (has no administration in time range)  potassium chloride SA (KLOR-CON M) CR tablet 20 mEq (has no administration in time range)  pantoprazole (PROTONIX) injection 40 mg (40 mg Intravenous Given 09/12/23 2317)  enoxaparin (LOVENOX) injection 40 mg (has no administration in time range)  0.9 % NaCl with KCl 20 mEq/ L  infusion ( Intravenous New Bag/Given 09/12/23 2320)  acetaminophen (TYLENOL) tablet 650 mg (has no administration in time range)    Or  acetaminophen (TYLENOL) suppository 650 mg (has no administration in time range)  traZODone (DESYREL) tablet 25 mg (has no administration in time range)  magnesium hydroxide (MILK OF MAGNESIA) suspension 30 mL (has no administration in time range)  metoCLOPramide (REGLAN) injection 10 mg (10 mg Intravenous Given 09/12/23 2316)  iohexol (OMNIPAQUE) 300 MG/ML solution 75 mL (75 mLs Intravenous Contrast Given 09/12/23 2111)  sodium chloride 0.9 % bolus 500 mL (500 mLs Intravenous New Bag/Given 09/12/23 2214)     IMPRESSION / MDM / ASSESSMENT AND PLAN / ED COURSE  I reviewed the triage vital signs and the nursing notes.   Patient's presentation is most consistent with acute presentation with potential threat to life or bodily function.   Patient comes in with worsening confusion, nausea, vomiting patient is a poor historian will get CT imaging to ensure no evidence of intercranial hemorrhage, obstruction, perforation etc. check electrolyte abnormalities.  EKG with abnormalities diffuse ST depressions  suspect this is related to electrolyte abnormalities.  QTc prolonged here we will hold off on Zofran.  Lipase normal troponin slightly elevated but similar to about a month ago.  Magnesium was low phosphorus is low sodium is low creatinine baseline.   White count is elevated she meets sepsis criteria CT imaging without any source they worsening potentially some stranding around the kidney but UA without evidence of UTI.  This could just be reactive but I have ordered blood cultures, lactate and cover patient broadly in case patient is bacteremic.  Hold off on full fluid resuscitation case this low sodium is chronic in nature do not want to fluid overload her or correct the sodium too fast.  I discussed the case with  the hospital team for admission  The patient is on the cardiac monitor to evaluate for evidence of arrhythmia and/or significant heart rate changes.      FINAL CLINICAL IMPRESSION(S) / ED DIAGNOSES   Final diagnoses:  Sepsis, due to unspecified organism, unspecified whether acute organ dysfunction present (HCC)  Hypokalemia  Hypomagnesemia  Nausea and vomiting, unspecified vomiting type  Hyponatremia     Rx / DC Orders   ED Discharge Orders     None        Note:  This document was prepared using Dragon voice recognition software and may include unintentional dictation errors.   Concha Se, MD 09/12/23 [phone]

## 2023-09-13 ENCOUNTER — Encounter: Payer: Self-pay | Admitting: Family Medicine

## 2023-09-13 DIAGNOSIS — E871 Hypo-osmolality and hyponatremia: Secondary | ICD-10-CM | POA: Diagnosis not present

## 2023-09-13 DIAGNOSIS — R7989 Other specified abnormal findings of blood chemistry: Secondary | ICD-10-CM | POA: Diagnosis present

## 2023-09-13 DIAGNOSIS — D72829 Elevated white blood cell count, unspecified: Secondary | ICD-10-CM | POA: Diagnosis present

## 2023-09-13 DIAGNOSIS — N179 Acute kidney failure, unspecified: Secondary | ICD-10-CM | POA: Diagnosis present

## 2023-09-13 HISTORY — DX: Chronic kidney disease, unspecified: N17.9

## 2023-09-13 LAB — CBC
HCT: 34.9 % — ABNORMAL LOW (ref 36.0–46.0)
Hemoglobin: 12.5 g/dL (ref 12.0–15.0)
MCH: 30.7 pg (ref 26.0–34.0)
MCHC: 35.8 g/dL (ref 30.0–36.0)
MCV: 85.7 fL (ref 80.0–100.0)
Platelets: 261 10*3/uL (ref 150–400)
RBC: 4.07 MIL/uL (ref 3.87–5.11)
RDW: 12.5 % (ref 11.5–15.5)
WBC: 24.1 10*3/uL — ABNORMAL HIGH (ref 4.0–10.5)
nRBC: 0 % (ref 0.0–0.2)

## 2023-09-13 LAB — LACTIC ACID, PLASMA
Lactic Acid, Venous: 1.7 mmol/L (ref 0.5–1.9)
Lactic Acid, Venous: 2.2 mmol/L (ref 0.5–1.9)
Lactic Acid, Venous: 2.3 mmol/L (ref 0.5–1.9)
Lactic Acid, Venous: 3.9 mmol/L (ref 0.5–1.9)

## 2023-09-13 LAB — BASIC METABOLIC PANEL
Anion gap: 14 (ref 5–15)
BUN: 17 mg/dL (ref 8–23)
CO2: 22 mmol/L (ref 22–32)
Calcium: 9 mg/dL (ref 8.9–10.3)
Chloride: 89 mmol/L — ABNORMAL LOW (ref 98–111)
Creatinine, Ser: 1.43 mg/dL — ABNORMAL HIGH (ref 0.44–1.00)
GFR, Estimated: 37 mL/min — ABNORMAL LOW (ref >60)
Glucose, Bld: 139 mg/dL — ABNORMAL HIGH (ref 70–99)
Potassium: 2.8 mmol/L — ABNORMAL LOW (ref 3.5–5.1)
Sodium: 125 mmol/L — ABNORMAL LOW (ref 135–145)

## 2023-09-13 LAB — NA AND K (SODIUM & POTASSIUM), RAND UR
Potassium Urine: 65 mmol/L
Sodium, Ur: 84 mmol/L

## 2023-09-13 LAB — TSH: TSH: 13.199 u[IU]/mL — ABNORMAL HIGH (ref 0.350–4.500)

## 2023-09-13 LAB — OSMOLALITY, URINE: Osmolality, Ur: 385 mosm/kg (ref 300–900)

## 2023-09-13 LAB — T4, FREE: Free T4: 1.55 ng/dL — ABNORMAL HIGH (ref 0.61–1.12)

## 2023-09-13 LAB — SARS CORONAVIRUS 2 BY RT PCR: SARS Coronavirus 2 by RT PCR: NEGATIVE

## 2023-09-13 LAB — OSMOLALITY: Osmolality: 273 mosm/kg — ABNORMAL LOW (ref 275–295)

## 2023-09-13 LAB — APTT: aPTT: 20 s — ABNORMAL LOW (ref 24–36)

## 2023-09-13 LAB — PROTIME-INR
INR: 1.5 — ABNORMAL HIGH (ref 0.8–1.2)
Prothrombin Time: 18 s — ABNORMAL HIGH (ref 11.4–15.2)

## 2023-09-13 LAB — PROCALCITONIN: Procalcitonin: 0.27 ng/mL

## 2023-09-13 MED ORDER — SODIUM CHLORIDE 1 G PO TABS
1.0000 g | ORAL_TABLET | Freq: Three times a day (TID) | ORAL | Status: DC
  Administered 2023-09-13 – 2023-09-16 (×9): 1 g via ORAL
  Filled 2023-09-13 (×8): qty 1

## 2023-09-13 MED ORDER — DIPHENHYDRAMINE HCL 25 MG PO CAPS: 25.0000 mg | ORAL_CAPSULE | Freq: Every evening | ORAL | Status: DC | PRN

## 2023-09-13 MED ORDER — K PHOS MONO-SOD PHOS DI & MONO 155-852-130 MG PO TABS
500.0000 mg | ORAL_TABLET | Freq: Three times a day (TID) | ORAL | Status: DC
  Administered 2023-09-13 – 2023-09-14 (×4): 500 mg via ORAL
  Filled 2023-09-13 (×6): qty 2

## 2023-09-13 MED ORDER — DIPHENHYDRAMINE-APAP (SLEEP) 25-500 MG PO TABS: 1.0000 | ORAL_TABLET | Freq: Every evening | ORAL | Status: DC | PRN

## 2023-09-13 MED ORDER — SODIUM CHLORIDE 0.9 % IV SOLN: 2.0000 g | Freq: Once | INTRAVENOUS | Status: DC

## 2023-09-13 MED ORDER — VANCOMYCIN HCL IN DEXTROSE 1-5 GM/200ML-% IV SOLN: 1000.0000 mg | Freq: Once | INTRAVENOUS | Status: DC

## 2023-09-13 MED ORDER — ENOXAPARIN SODIUM 30 MG/0.3ML IJ SOSY
30.0000 mg | PREFILLED_SYRINGE | INTRAMUSCULAR | Status: DC
  Administered 2023-09-13: 30 mg via SUBCUTANEOUS
  Filled 2023-09-13: qty 0.3

## 2023-09-13 MED ORDER — LACTATED RINGERS IV SOLN: INTRAVENOUS | Status: DC

## 2023-09-13 MED ORDER — ACETAMINOPHEN 500 MG PO TABS: 500.0000 mg | ORAL_TABLET | Freq: Every evening | ORAL | Status: DC | PRN

## 2023-09-13 MED ORDER — MAGNESIUM SULFATE 4 GM/100ML IV SOLN
4.0000 g | Freq: Once | INTRAVENOUS | Status: AC
  Administered 2023-09-13: 4 g via INTRAVENOUS
  Filled 2023-09-13: qty 100

## 2023-09-13 MED ORDER — POTASSIUM CHLORIDE 20 MEQ PO PACK
40.0000 meq | PACK | Freq: Once | ORAL | Status: AC
  Administered 2023-09-13: 40 meq via ORAL
  Filled 2023-09-13: qty 2

## 2023-09-13 MED ORDER — SODIUM CHLORIDE 0.9 % IV SOLN: 2.0000 g | INTRAVENOUS | Status: DC

## 2023-09-13 MED ORDER — VANCOMYCIN HCL 500 MG/100ML IV SOLN
500.0000 mg | Freq: Once | INTRAVENOUS | Status: AC
  Administered 2023-09-13: 500 mg via INTRAVENOUS
  Filled 2023-09-13: qty 100

## 2023-09-13 MED ORDER — METRONIDAZOLE 500 MG/100ML IV SOLN
500.0000 mg | Freq: Two times a day (BID) | INTRAVENOUS | Status: DC
  Administered 2023-09-13: 500 mg via INTRAVENOUS
  Filled 2023-09-13 (×2): qty 100

## 2023-09-13 MED ORDER — LACTATED RINGERS IV SOLN
150.0000 mL/h | INTRAVENOUS | Status: DC
  Administered 2023-09-13: 150 mL/h via INTRAVENOUS

## 2023-09-13 MED ORDER — VANCOMYCIN HCL 1500 MG/300ML IV SOLN: 1500.0000 mg | INTRAVENOUS | Status: DC

## 2023-09-13 NOTE — Progress Notes (Signed)
Progress Note   Patient: Cindy Huerta [id number]:[id number] DOB: 20-Dec-1945 DOA: 09/12/2023     1 DOS: the patient was seen and examined on 09/13/2023   Brief hospital course: Cindy Huerta is a 77 y.o. female with medical history significant for hypertension, stage IIIa CKD, CHF with preserved EF, depression, dementia, hypothyroidism, GERD and depression who presented to the ED on evening of 09/12/2023 for evaluation of recurrent nausea and vomiting.  See full H&P for HPI on admission and ED course.  Pt was admitted for management of hyponatremia and AKI.  Assessment and Plan: * Hyponatremia Due to N/V.  Na on admission 124 >> 125 with NS fluids -- these are on critical shortage.  Will stop after current bag finishes. 10/9 - N/V seems resolved today --PO Na-Cl tabs ordered TID --Encourage PO hydration --On slow LR fluids x 1 day --IV antiemetics to tolerate PO intake if needed --BMP's daily  Acute kidney injury superimposed on chronic kidney disease (HCC) Cr on admission 1.52 (baseline 1.1--1.2). Given 500 cc fluids in ED. Continued on NS fluids on admission. Cr improved to 1.43 --Stopping NS fluids due to critical shortage --Gentle LR fluids x 1 day --Encourage PO hydration - offer fluids to pt frequently & keep water in reach at all times --Monitor BMP  Leukocytosis WBC 25.3 >> 24.1. Associated with elevated lactic acid. Procal 0.27 (with AKI an reduced clearance, unlikely bacterial process) Suspect a viral or foodborne illness causing N/V, not bacterial. Lactic acidosis due to dehydration. UA negative.  No respiratory symptoms. --Stop IV Vanc / Cefepime / Flagyl --Monitor for diarrhea and test for C diff ASAP if needed' --Monitor CBC --Monitor fever curve & closely for any s/sx's of bacterial infection --Follow blood cultures  Hypokalemia K 3.2 on admission >> 2.8 today. --Finish current bag of NS+20 mEq Kcl --Oral Kcl 40 mEq x 1 today --Also getting K-phos --Low dose  daily Kcl 20 mEq resumed --Monitor BMP  Elevated troponin Pt has not chest pain, suspect mild demand ischemia vs poor renal clearance with AKI.  Abnormal TSH TSH elevated 13.199 on admission Suspect non-compliance with Synthroid --Add on free T4  Nausea & vomiting POA, now seems resolved. Anti-emetics PRN On heart diet & seems tolerating        Subjective: Pt seen this AM. She is poor historian, minimally conversational.  Denies feeling sick, nausea or vomiting.     Physical Exam: Vitals:   09/13/23 1138 09/13/23 1140 09/13/23 1222 09/13/23 1523  BP: (!) 126/56  (!) 153/63 (!) 150/69  Pulse:  66 64 68  Resp:  19 16 18   Temp:   98.2 F (36.8 C) 98 F (36.7 C)  TempSrc:      SpO2:  98% 100% 98%  Weight:      Height:       General exam: awake, alert, no acute distress HEENT: moist mucus membranes, hearing grossly normal  Respiratory system: CTAB, no wheezes, rales or rhonchi, normal respiratory effort. Cardiovascular system: normal S1/S2, RRR, no JVD, murmurs, rubs, gallops, no pedal edema.   Gastrointestinal system: soft, NT, ND, no HSM felt, +bowel sounds. Central nervous system: A&O x self. no gross focal neurologic deficits, normal speech Extremities: moves all, no edema, normal tone Skin: dry, intact, normal temperature, normal color, No rashes, lesions or ulcers Psychiatry: normal mood, congruent affect   Data Reviewed:  Notable labs ---  Na 124 >> 125 K 3.2 >> 2.8  Cl 86 >> 89 Bicarb 17 >> 22  Glucose 139 Cr 1.52 >> 1.43 Phos 1.2  Mg 1.3  WBC 25.3 >> 24.1  Family Communication: None present, will attempt to call as time allows  Disposition: Status is: Inpatient Remains inpatient appropriate because: ongoing electrolyte derangements   Planned Discharge Destination: Home    Time spent: 46 minutes  Author: Pennie Banter, DO 09/13/2023 3:36 PM  For on call review [email or web address].

## 2023-09-13 NOTE — Hospital Course (Signed)
Cindy Huerta is a 77 y.o. female with medical history significant for hypertension, stage IIIa CKD, CHF with preserved EF, depression, dementia, hypothyroidism, GERD and depression who presented to the ED on evening of 09/12/2023 for evaluation of recurrent nausea and vomiting.  See full H&P for HPI on admission and ED course.  Pt was admitted for management of hyponatremia and AKI.

## 2023-09-13 NOTE — Assessment & Plan Note (Addendum)
Due to N/V.  Na on admission 124 >> 125 with NS fluids -- these are on critical shortage.  Will stop after current bag finishes. 10/9 - N/V seems resolved today --PO Na-Cl tabs ordered TID --Encourage PO hydration --On slow LR fluids x 1 day --IV antiemetics to tolerate PO intake if needed --BMP's daily

## 2023-09-13 NOTE — Plan of Care (Signed)

## 2023-09-13 NOTE — Assessment & Plan Note (Addendum)
Cr on admission 1.52 (baseline 1.1--1.2). Given 500 cc fluids in ED. Continued on NS fluids on admission. Cr improved to 1.43 --Stopping NS fluids due to critical shortage --Gentle LR fluids x 1 day --Encourage PO hydration - offer fluids to pt frequently & keep water in reach at all times --Monitor BMP

## 2023-09-13 NOTE — Assessment & Plan Note (Addendum)
WBC 25.3 >> 24.1. Associated with elevated lactic acid. Procal 0.27 (with AKI an reduced clearance, unlikely bacterial process) Suspect a viral or foodborne illness causing N/V, not bacterial. Lactic acidosis due to dehydration. UA negative.  No respiratory symptoms. --Stop IV Vanc / Cefepime / Flagyl --Monitor for diarrhea and test for C diff ASAP if needed' --Monitor CBC --Monitor fever curve & closely for any s/sx's of bacterial infection --Follow blood cultures

## 2023-09-13 NOTE — Assessment & Plan Note (Signed)
POA, now seems resolved. Anti-emetics PRN On heart diet & seems tolerating

## 2023-09-13 NOTE — Assessment & Plan Note (Addendum)
K 3.2 on admission >> 2.8 today. --Finish current bag of NS+20 mEq Kcl --Oral Kcl 40 mEq x 1 today --Also getting K-phos --Low dose daily Kcl 20 mEq resumed --Monitor BMP

## 2023-09-13 NOTE — Assessment & Plan Note (Addendum)
Pt has not chest pain, suspect mild demand ischemia vs poor renal clearance with AKI.

## 2023-09-13 NOTE — Progress Notes (Signed)
CODE SEPSIS - PHARMACY COMMUNICATION  **Broad Spectrum Antibiotics should be administered within 1 hour of Sepsis diagnosis**  Time Code Sepsis Called/Page Received: 10/7 @ 2251   Antibiotics Ordered: Vanc , Cefepime and metronidazole   Time of 1st antibiotic administration: Cefepime 2 gm IV X 1 on 10/8 @ 0005   Additional action taken by pharmacy:   If necessary, Name of Provider/Nurse Contacted:     Aeon Koors D ,PharmD Clinical Pharmacist  09/13/2023  1:36 AM

## 2023-09-13 NOTE — Assessment & Plan Note (Signed)
TSH elevated 13.199 on admission Suspect non-compliance with Synthroid --Add on free T4

## 2023-09-13 NOTE — Progress Notes (Signed)
Pharmacy Antibiotic Note  Cindy Huerta is a 77 y.o. female admitted on 09/12/2023 with  unknown source .  Pharmacy has been consulted for Cefepime and Vancomycin dosing.  Plan: Cefepime 2 gm IV X 1 given on 10/08 @ 0005. Cefepime 2 gm IV Q24H ordered to start on 10/09 @ 0000.  Vancomycin 1 gm IV X 1 given in ED on 10/08 @ 0200. Additional Vanc 500 mg IV X 1 ordered to make total loading dose of 1500 mg.  Vancomycin 1500 mg IV Q48H ordered to start on 10/10 @ 0200.  AUC = 540.7 Vanc trough = 11.2   Height: 5\' 6"  (167.6 cm) Weight: 69.5 kg (153 lb 3.5 oz) IBW/kg (Calculated) : 59.3  Temp (24hrs), Avg:99.2 F (37.3 C), Min:98 F (36.7 C), Max:100.4 F (38 C)  Recent Labs  Lab 09/12/23 1951 09/12/23 2330  WBC 25.3*  --   CREATININE 1.52*  --   LATICACIDVEN  --  3.9*    Estimated Creatinine Clearance: 28.1 mL/min (A) (by C-G formula based on SCr of 1.52 mg/dL (H)).    Allergies  Allergen Reactions   Latex Rash   Novocain [Procaine] Other (See Comments)    Unsure - told by DDS not to let anyone give it to her  Confusion    Zestril [Lisinopril] Cough   Benadryl [Diphenhydramine] Other (See Comments)    Jitteriness Agitation   Biaxin [Clarithromycin] Other (See Comments)    Confusion     Roxicodone [Oxycodone] Nausea And Vomiting and Anxiety    Antimicrobials this admission:   >>    >>   Dose adjustments this admission:   Microbiology results:  BCx:   UCx:    Sputum:    MRSA PCR:   Thank you for allowing pharmacy to be a part of this patient's care.  [name] 09/13/2023 2:08 AM

## 2023-09-13 NOTE — Progress Notes (Signed)
Anticoagulation monitoring(Lovenox):  77 yo  female ordered Lovenox 40 mg Q24h    Filed Weights   09/13/23 0147  Weight: 69.5 kg (153 lb 3.5 oz)   BMI  24.7  Lab Results  Component Value Date   CREATININE 1.52 (H) 09/12/2023   CREATININE 1.13 (H) 08/12/2023   CREATININE 1.20 (H) 08/11/2023   Estimated Creatinine Clearance: 28.1 mL/min (A) (by C-G formula based on SCr of 1.52 mg/dL (H)). Hemoglobin & Hematocrit     Component Value Date/Time   HGB 15.0 09/12/2023 1951   HCT 41.4 09/12/2023 1951     Per Protocol for Patient with estCrcl < 30 ml/min and BMI < 30, will transition to Lovenox 30 mg Q24h.

## 2023-09-13 NOTE — TOC Initial Note (Signed)
Transition of Care [facility]) - Initial/Assessment Note    Patient Details  Name: Cindy Huerta MRN: [id number] Date of Birth: 01-23-1946  Transition of Care [facility]) CM/SW Contact:    Marquita Palms, LCSW Phone Number: 09/13/2023, 10:14 AM  Clinical Narrative:                  CSW met with patient bedside and spoke with son via telephone. Patient was awake but unresponsive to the questions being asked. CSW spoke with son who gave information for patient stating that she uses an online [facility] or if picking up [facility]. He stated that she has a walker at home. He reports that he and his wife are her caretakers and they will be picking her up upon discharge.   Expected Discharge Plan: Home w Home Health Services Barriers to Discharge: Continued Medical Work up   Patient Goals and CMS Choice            Expected Discharge Plan and Services       Living arrangements for the past 2 months: Single Family Home                                      Prior Living Arrangements/Services Living arrangements for the past 2 months: Single Family Home Lives with:: Adult Children                   Activities of Daily Living   ADL Screening (condition at time of admission) Independently performs ADLs?: No Does the patient have a NEW difficulty with bathing/dressing/toileting/self-feeding that is expected to last >3 days?: No Does the patient have a NEW difficulty with getting in/out of bed, walking, or climbing stairs that is expected to last >3 days?: No Does the patient have a NEW difficulty with communication that is expected to last >3 days?: No Is the patient deaf or have difficulty hearing?: No Does the patient have difficulty seeing, even when wearing glasses/contacts?: No Does the patient have difficulty concentrating, remembering, or making decisions?: Yes  Permission Sought/Granted                  Emotional Assessment Appearance::  Disheveled Attitude/Demeanor/Rapport: Gracious Affect (typically observed): Other (comment) (confused) Orientation: :  (patient confused)      Admission diagnosis:  Hyponatremia [E87.1] Patient Active Problem List   Diagnosis Date Noted   Acute kidney injury superimposed on chronic kidney disease (HCC) 09/13/2023   Leukocytosis 09/13/2023   AKI (acute kidney injury) (HCC) 08/09/2023   Syncope 08/09/2023   Hypomagnesemia 08/09/2023   QT prolongation 08/09/2023   Acute metabolic encephalopathy 08/09/2023   Elevated troponin 08/09/2023   Iron deficiency anemia 08/17/2022   Elevated lactic acid level 08/17/2022   UTI (urinary tract infection) 08/17/2022   Fall at home, initial encounter 08/17/2022   Abnormal LFTs 08/17/2022   Dehydration 07/27/2022   Malnutrition of moderate degree 06/30/2022   Acute urinary retention 06/29/2022   Acute renal failure superimposed on stage 3b chronic kidney disease (HCC) 06/28/2022   DNR (do not resuscitate) 06/28/2022   Hyponatremia 06/18/2022   Hypokalemia 06/18/2022   Chronic diastolic CHF (congestive heart failure) (HCC) 06/18/2022   Dysuria 06/18/2022   HTN (hypertension) 11/24/2021   Normocytic anemia 11/24/2021   Vertigo 11/24/2021   Breast CA (HCC) 11/22/2021   Breast cancer (HCC) 11/20/2021   Intractable nausea and vomiting 10/09/2021  Weakness 10/07/2021   Carcinoma of overlapping sites of left breast in female, estrogen receptor positive (HCC) 10/06/2021   Dizziness 10/06/2021   Rectal bleeding 10/06/2021   Nausea & vomiting 10/06/2021   GIB (gastrointestinal bleeding) 10/06/2021   Left-sided weakness 07/29/2021   HLD (hyperlipidemia) 07/29/2021   Hypothyroidism 07/29/2021   Left sided numbness 07/29/2021   Depression with anxiety    CKD (chronic kidney disease), stage IIIa    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   Epigastric pain 04/23/2021   GERD (gastroesophageal reflux disease) 04/23/2021   Benign essential hypertension  01/20/2021   History of breast cancer 01/20/2021   History of TIA (transient ischemic attack) 01/20/2021   Palpitations 01/20/2021   Malignant neoplasm of 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) 12/06/2018   PCP:  Eloisa Northern, MD Pharmacy:   [phone] [address] [place] [facility] [address] [place] [address] Phone: [phone] Fax: [phone]  [facility] [address] [place] [address] Phone: [phone] Fax: [phone]     Social Determinants of Health (SDOH) Social History: SDOH Screenings   Food Insecurity: No Food Insecurity (09/13/2023)  Housing: Low Risk  (09/13/2023)  Transportation Needs: No Transportation Needs (09/13/2023)  Utilities: Not At Risk (09/13/2023)  Financial Resource Strain: Patient Declined (12/29/2022)   [facility] from [facility], [facility]  Tobacco Use: Low Risk  (09/13/2023)   SDOH Interventions:     Readmission Risk Interventions    09/13/2023   10:08 AM 06/20/2022    1:01 PM 11/25/2021    1:15 PM  Readmission Risk Prevention Plan  Transportation Screening Complete  Complete  PCP or Specialist Appt within 3-5 Days Complete Complete Complete  HRI or Home Care Consult Complete  Complete  Social Work Consult for Recovery Care Planning/Counseling Complete Complete Complete  Palliative Care Screening Not Applicable Not Applicable Not Applicable  Medication Review Oceanographer) Not Complete  Complete  Med Review Comments Patient's son will speak with nurses upon discharge

## 2023-09-14 DIAGNOSIS — A419 Sepsis, unspecified organism: Secondary | ICD-10-CM

## 2023-09-14 DIAGNOSIS — E876 Hypokalemia: Secondary | ICD-10-CM | POA: Diagnosis not present

## 2023-09-14 DIAGNOSIS — E871 Hypo-osmolality and hyponatremia: Secondary | ICD-10-CM | POA: Diagnosis not present

## 2023-09-14 DIAGNOSIS — R112 Nausea with vomiting, unspecified: Secondary | ICD-10-CM

## 2023-09-14 HISTORY — DX: Sepsis, unspecified organism: A41.9

## 2023-09-14 LAB — CBC
HCT: 29.8 % — ABNORMAL LOW (ref 36.0–46.0)
Hemoglobin: 10.5 g/dL — ABNORMAL LOW (ref 12.0–15.0)
MCH: 30.7 pg (ref 26.0–34.0)
MCHC: 35.2 g/dL (ref 30.0–36.0)
MCV: 87.1 fL (ref 80.0–100.0)
Platelets: 169 10*3/uL (ref 150–400)
RBC: 3.42 MIL/uL — ABNORMAL LOW (ref 3.87–5.11)
RDW: 13.1 % (ref 11.5–15.5)
WBC: 14.6 10*3/uL — ABNORMAL HIGH (ref 4.0–10.5)
nRBC: 0 % (ref 0.0–0.2)

## 2023-09-14 LAB — BASIC METABOLIC PANEL
Anion gap: 10 (ref 5–15)
BUN: 16 mg/dL (ref 8–23)
CO2: 22 mmol/L (ref 22–32)
Calcium: 8.3 mg/dL — ABNORMAL LOW (ref 8.9–10.3)
Chloride: 96 mmol/L — ABNORMAL LOW (ref 98–111)
Creatinine, Ser: 1.27 mg/dL — ABNORMAL HIGH (ref 0.44–1.00)
GFR, Estimated: 43 mL/min — ABNORMAL LOW (ref >60)
Glucose, Bld: 84 mg/dL (ref 70–99)
Potassium: 2.9 mmol/L — ABNORMAL LOW (ref 3.5–5.1)
Sodium: 128 mmol/L — ABNORMAL LOW (ref 135–145)

## 2023-09-14 LAB — MAGNESIUM: Magnesium: 2.2 mg/dL (ref 1.7–2.4)

## 2023-09-14 LAB — PHOSPHORUS: Phosphorus: 3.3 mg/dL (ref 2.5–4.6)

## 2023-09-14 MED ORDER — ENOXAPARIN SODIUM 40 MG/0.4ML IJ SOSY
40.0000 mg | PREFILLED_SYRINGE | INTRAMUSCULAR | Status: DC
  Administered 2023-09-14 – 2023-09-16 (×3): 40 mg via SUBCUTANEOUS
  Filled 2023-09-14 (×3): qty 0.4

## 2023-09-14 MED ORDER — POTASSIUM CHLORIDE CRYS ER 20 MEQ PO TBCR
40.0000 meq | EXTENDED_RELEASE_TABLET | Freq: Two times a day (BID) | ORAL | Status: AC
  Administered 2023-09-14 (×2): 40 meq via ORAL
  Filled 2023-09-14 (×2): qty 2

## 2023-09-14 NOTE — Progress Notes (Signed)
Progress Note Patient: Cindy Huerta VWU:[id number] DOB: July 07, 1946 DOA: 09/12/2023     2 DOS: the patient was seen and examined on 09/14/2023   Brief hospital course: Cindy Huerta is a 77 y.o. female with medical history significant for hypertension, stage IIIa CKD, CHF with preserved EF, depression, dementia, hypothyroidism, GERD and depression. They presented to the ED on evening of 09/12/2023 for evaluation of recurrent nausea and vomiting.     ED Course: BP was 199/101 with otherwise normal vital signs.  Labs: Na+ 124, Cl- 86, K+ 3.2 and CO2 17, glucose 149 creatinine 1.52. Lactic acid 3.9. WBC 25.3.  Urinalysis: 0-5 WBCs no bacteria and 100 protein.  Blood cultures were drawn. EKG: sinus tachycardia with a rate of 94 with multiple premature complexes ventricular and supraventricular, left axis deviation and prolonged QT interval with QTc 524 MS. Imaging: Not contrast head CT: stable senescent changes with no acute intracranial abnormalities.  Abdominal pelvic CT scan with contrast revealed right perinephric edema with enhancement of the right renal collecting system and ureter suspicious for UTI and chronic diverticulosis without diverticulitis, stable hiatal hernia and aortic atherosclerosis.   The patient was given 500 mL IV normal saline bolus and admitted for management of hyponatremia and AKI.  Assessment and Plan: Hyponatremia- Due to N/V.  Na on admission 124 >> 125>128 with NS fluids and transitioned to oral replacement due to critical shortage nationwide for IVF. Symptomatically has improved and has increased PO intake.  --PO Na-Cl tabs ordered TID --IV antiemetics to tolerate PO intake if needed. Put on regular diet. --BMP's daily  Acute kidney injury superimposed on chronic kidney disease (HCC) Cr on admission 1.52 (baseline 1.1--1.2). Now back to baseline 1.2  Hypokalemia K 3.2 on admission >> 2.9 today. --replace orally --Monitor BMP  Elevated troponin- max of 86. Pt  has not chest pain, suspect mild demand ischemia vs poor renal clearance with AKI.  Abnormal TSH- TSH elevated 13.199 on admission Suspect non-compliance with Synthroid. T4 1.55 - repeat TSH after acute illness  Nausea & vomiting POA, now seems resolved. Anti-emetics PRN  Subjective: Pt reports feeling well. Anxious to get home again. Understanding of needing to improve her electrolytes some first.   Physical Exam: Vitals:   09/13/23 1222 09/13/23 1523 09/13/23 2128 09/13/23 2331  BP: (!) 153/63 (!) 150/69 (!) 153/53 121/61  Pulse: 64 68 63 60  Resp: 16 18 16 20   Temp: 98.2 F (36.8 C) 98 F (36.7 C) 98.9 F (37.2 C) 98.3 F (36.8 C)  TempSrc:   Oral   SpO2: 100% 98% 100% 99%  Weight:      Height:       General exam: awake, alert, no acute distress HEENT: moist mucus membranes, hearing grossly normal  Respiratory system: CTAB, no wheezes, rales or rhonchi, normal respiratory effort. Cardiovascular system: normal S1/S2, RRR, no JVD, murmurs, rubs, gallops, no pedal edema.   Gastrointestinal system: soft, NT, ND, no HSM felt, +bowel sounds. Central nervous system: A&O x self. no gross focal neurologic deficits, normal speech Extremities: moves all, no edema, normal tone Skin: dry, intact, normal temperature, normal color, No rashes, lesions or ulcers Psychiatry: normal mood, congruent affect   Data Reviewed:    Latest Ref Rng & Units 09/14/2023    8:36 AM 09/13/2023    4:06 AM 09/12/2023    7:51 PM  CBC  WBC 4.0 - 10.5 K/uL 14.6  24.1  25.3   Hemoglobin 12.0 - 15.0 g/dL 29.5  12.5  15.0   Hematocrit 36.0 - 46.0 % 29.8  34.9  41.4   Platelets 150 - 400 K/uL 169  261  244       Latest Ref Rng & Units 09/14/2023    6:36 AM 09/13/2023    4:06 AM 09/12/2023    7:51 PM  BMP  Glucose 70 - 99 mg/dL 84  629  528   BUN 8 - 23 mg/dL 16  17  14    Creatinine 0.44 - 1.00 mg/dL 4.13  2.44  0.10   Sodium 135 - 145 mmol/L 128  125  124   Potassium 3.5 - 5.1 mmol/L 2.9  2.8  3.2    Chloride 98 - 111 mmol/L 96  89  86   CO2 22 - 32 mmol/L 22  22  17    Calcium 8.9 - 10.3 mg/dL 8.3  9.0  27.2     Family Communication: None at bedside  Disposition: Status is: Inpatient Remains inpatient appropriate because: ongoing electrolyte derangements   Planned Discharge Destination: Home    Time spent: 35 minutes  Author: Leeroy Bock, MD 09/14/2023 7:51 AM  For on call review [email or web address].

## 2023-09-14 NOTE — Plan of Care (Signed)

## 2023-09-14 NOTE — Plan of Care (Signed)
  Problem: Respiratory: Goal: Ability to maintain adequate ventilation will improve Outcome: Progressing   Problem: Education: Goal: Knowledge of General Education information will improve Description: Including pain rating scale, medication(s)/side effects and non-pharmacologic comfort measures Outcome: Progressing   Problem: Health Behavior/Discharge Planning: Goal: Ability to manage health-related needs will improve Outcome: Progressing   Problem: Activity: Goal: Risk for activity intolerance will decrease Outcome: Progressing   Problem: Pain Managment: Goal: General experience of comfort will improve Outcome: Progressing

## 2023-09-15 DIAGNOSIS — A419 Sepsis, unspecified organism: Secondary | ICD-10-CM | POA: Diagnosis not present

## 2023-09-15 DIAGNOSIS — R112 Nausea with vomiting, unspecified: Secondary | ICD-10-CM | POA: Diagnosis not present

## 2023-09-15 DIAGNOSIS — E876 Hypokalemia: Secondary | ICD-10-CM | POA: Diagnosis not present

## 2023-09-15 DIAGNOSIS — E871 Hypo-osmolality and hyponatremia: Secondary | ICD-10-CM | POA: Diagnosis not present

## 2023-09-15 LAB — BASIC METABOLIC PANEL
Anion gap: 8 (ref 5–15)
BUN: 16 mg/dL (ref 8–23)
CO2: 23 mmol/L (ref 22–32)
Calcium: 8.3 mg/dL — ABNORMAL LOW (ref 8.9–10.3)
Chloride: 99 mmol/L (ref 98–111)
Creatinine, Ser: 1.2 mg/dL — ABNORMAL HIGH (ref 0.44–1.00)
GFR, Estimated: 46 mL/min — ABNORMAL LOW (ref >60)
Glucose, Bld: 85 mg/dL (ref 70–99)
Potassium: 3.5 mmol/L (ref 3.5–5.1)
Sodium: 130 mmol/L — ABNORMAL LOW (ref 135–145)

## 2023-09-15 MED ORDER — PANTOPRAZOLE SODIUM 40 MG PO TBEC
40.0000 mg | DELAYED_RELEASE_TABLET | Freq: Two times a day (BID) | ORAL | Status: DC
  Administered 2023-09-15 – 2023-09-16 (×2): 40 mg via ORAL
  Filled 2023-09-15 (×2): qty 1

## 2023-09-15 NOTE — Progress Notes (Signed)
Progress Note Patient: Cindy Huerta ZOX:[id number] DOB: 30-Nov-1946 DOA: 09/12/2023     3 DOS: the patient was seen and examined on 09/15/2023   Brief hospital course: Cindy Huerta is a 77 y.o. female with medical history significant for hypertension, stage IIIa CKD, CHF with preserved EF, depression, dementia, hypothyroidism, GERD and depression. They presented to the ED on evening of 09/12/2023 for evaluation of recurrent nausea and vomiting.     ED Course: BP was 199/101 with otherwise normal vital signs.  Labs: Na+ 124, Cl- 86, K+ 3.2 and CO2 17, glucose 149 creatinine 1.52. Lactic acid 3.9. WBC 25.3.  Urinalysis: 0-5 WBCs no bacteria and 100 protein.  Blood cultures were drawn. EKG: sinus tachycardia with a rate of 94 with multiple premature complexes ventricular and supraventricular, left axis deviation and prolonged QT interval with QTc 524 MS. Imaging: Not contrast head CT: stable senescent changes with no acute intracranial abnormalities.  Abdominal pelvic CT scan with contrast revealed right perinephric edema with enhancement of the right renal collecting system and ureter suspicious for UTI and chronic diverticulosis without diverticulitis, stable hiatal hernia and aortic atherosclerosis.   The patient was given 500 mL IV normal saline bolus and admitted for management of hyponatremia and AKI.  Assessment and Plan: Hyponatremia- Due to N/V.  Na on admission 124 >> 125>128>130 with NS fluids and transitioned to oral replacement due to critical shortage nationwide for IVF. Symptomatically has improved and has increased PO intake.  --PO Na-Cl tabs ordered TID --IV antiemetics to tolerate PO intake if needed. Put on regular diet. --BMP's daily - PT ordered. Patient lives with her son and will likely benefit from [facility].  Acute kidney injury superimposed on chronic kidney disease (HCC) Cr on admission 1.52. Now back to baseline 1.2  Hypokalemia K 3.2 on admission >> 2.9>3.5  today. --replace orally --Monitor BMP  Elevated troponin- max of 86. Pt has not chest pain, suspect mild demand ischemia vs poor renal clearance with AKI.  Abnormal TSH- TSH elevated 13.199 on admission Suspect non-compliance with Synthroid. T4 1.55 - repeat TSH after acute illness  Nausea & vomiting POA, now seems resolved. Anti-emetics PRN  Subjective: Pt reports feeling well She wants to go home but not sure if she is able to get to bathroom like she was before. Discussed her abilities from her baseline and with her son for her home support. Ordering a PT consult and if HH is appropriate she can likely go home tomorrow.  Physical Exam: Vitals:   09/14/23 0750 09/14/23 1523 09/14/23 2150 09/15/23 0011  BP: 138/62 103/73 (!) 120/56 (!) 122/49  Pulse: (!) 58 (!) 58 65 (!) 57  Resp: 16 16  18   Temp: 98.2 F (36.8 C) 98.4 F (36.9 C)  98.4 F (36.9 C)  TempSrc:  Oral    SpO2: 100% 98%  99%  Weight:      Height:       General exam: awake, alert, no acute distress HEENT: moist mucus membranes, hearing grossly normal  Respiratory system: CTAB, no wheezes, rales or rhonchi, normal respiratory effort. Cardiovascular system: normal S1/S2, RRR, no JVD, murmurs, rubs, gallops, no pedal edema.   Gastrointestinal system: soft, NT, ND, no HSM felt, +bowel sounds. Central nervous system: A&O x self. no gross focal neurologic deficits, normal speech Extremities: moves all, no edema, normal tone Skin: dry, intact, normal temperature, normal color, No rashes, lesions or ulcers Psychiatry: normal mood, congruent affect   Data Reviewed:    Latest  Ref Rng & Units 09/14/2023    8:36 AM 09/13/2023    4:06 AM 09/12/2023    7:51 PM  CBC  WBC 4.0 - 10.5 K/uL 14.6  24.1  25.3   Hemoglobin 12.0 - 15.0 g/dL 40.9  81.1  91.4   Hematocrit 36.0 - 46.0 % 29.8  34.9  41.4   Platelets 150 - 400 K/uL 169  261  244       Latest Ref Rng & Units 09/15/2023    5:16 AM 09/14/2023    6:36 AM 09/13/2023     4:06 AM  BMP  Glucose 70 - 99 mg/dL 85  84  782   BUN 8 - 23 mg/dL 16  16  17    Creatinine 0.44 - 1.00 mg/dL 9.56  2.13  0.86   Sodium 135 - 145 mmol/L 130  128  125   Potassium 3.5 - 5.1 mmol/L 3.5  2.9  2.8   Chloride 98 - 111 mmol/L 99  96  89   CO2 22 - 32 mmol/L 23  22  22    Calcium 8.9 - 10.3 mg/dL 8.3  8.3  9.0     Family Communication: None at bedside  Disposition: Status is: Inpatient Remains inpatient appropriate because: ongoing electrolyte derangements   Planned Discharge Destination: Home    Time spent: 35 minutes  Author: Leeroy Bock, MD 09/15/2023 7:25 AM  For on call review [email or web address].

## 2023-09-15 NOTE — Discharge Instructions (Addendum)
Please review your medication list carefully as there have been several changes.  I would like you to follow up with your primary doctor within a week to recheck your blood pressure and sodium level.  Prozac dose was decreased due to possible contribution to lowering sodium. Discuss with her PCP if her depression/anxiety symptoms are needing further treatment not controlled on the lower dose

## 2023-09-15 NOTE — Progress Notes (Signed)
Physical Therapy Evaluation Patient Details Name: Cindy Huerta MRN: [id number] DOB: [birth year] Today's Date: 09/15/2023  History of Present Illness  Cindy Huerta is a 77 y.o. female with medical history significant for hypertension, stage IIIa CKD, CHF with preserved EF, depression, dementia, hypothyroidism, and GERD. Pt presented to the ED on evening of 09/12/2023 for evaluation of recurrent nausea and vomiting.   Clinical Impression  Orders Received. Patient in recliner upon arrival, agreeable to PT evaluation this date. Patient overall poor historian throughout session, therefore history obtained from son via telephone. Per son's reports, patient was Mod I with ambulation (intermittent use of rollator) and ADLs. Did require some assistance from son with IADL's. On evaluation, patient required supervision for transfers and ambulation due to cognition. Patient was able to ambulate for 100 ft, with frequent verbal cues for alignment within RW and negotiation around nursing unit due to decreased attention. Patient does have history of dementia at baseline. Patient will benefit from skilled acute PT services to address impairments (see below for additional details) and maximize patient's functional mobility. Anticipate PT follow up upon hospital discharge. Will continue to follow acutely.       If plan is discharge home, recommend the following: A little help with walking and/or transfers;Supervision due to cognitive status;Help with stairs or ramp for entrance   Can travel by private vehicle        Equipment Recommendations None recommended by PT  Recommendations for Other Services       Functional Status Assessment Patient has had a recent decline in their functional status and demonstrates the ability to make significant improvements in function in a reasonable and predictable amount of time.     Precautions / Restrictions Precautions Precautions: Fall Restrictions Weight Bearing  Restrictions: No      Mobility  Bed Mobility               General bed mobility comments: Not observed; patietn recieved and left in recliner    Transfers Overall transfer level: Needs assistance Equipment used: Rolling walker (2 wheels) Transfers: Sit to/from Stand, Bed to chair/wheelchair/BSC Sit to Stand: Supervision   Step pivot transfers: Supervision       General transfer comment: with use of RW; verbal cues for hand placement to stand/sit and transfer.    Ambulation/Gait Ambulation/Gait assistance: Supervision Gait Distance (Feet): 100 Feet Assistive device: Rolling walker (2 wheels) Gait Pattern/deviations: Decreased step length - right, Decreased step length - left Gait velocity: Decreased     General Gait Details: very slow gait speed, patient frequently distracted throughout ambulation requiring VC's to attention to task and maintain alignment within RW as well as to navigate items in community. Pt reluctant to ambulate without RW this date.  Stairs            Wheelchair Mobility     Tilt Bed    Modified Rankin (Stroke Patients Only)       Balance Overall balance assessment: Needs assistance Sitting-balance support: Feet supported, No upper extremity supported Sitting balance-Leahy Scale: Normal     Standing balance support: During functional activity, Bilateral upper extremity supported Standing balance-Leahy Scale: Good Standing balance comment: No LOB or significant imbalance noted with use of AD.                             Pertinent Vitals/Pain Pain Assessment Pain Assessment: No/denies pain    Home Living Family/patient expects to be discharged  to:: Private residence Living Arrangements: Children (Son) Available Help at Discharge: Family;Available 24 hours/day Type of Home: House Home Access: Stairs to enter Entrance Stairs-Rails: None Entrance Stairs-Number of Steps: 3 Alternate Level Stairs-Number of Steps:  9 (per son reports; has rails and landing) Home Layout: Two level Home Equipment: Cane - quad;Educational psychologist (4 wheels) Additional Comments: Pt unreliable historian; PT spoke with son via telephone to confirm home set up    Prior Function Prior Level of Function : Independent/Modified Independent;History of Falls (last six months);Patient poor historian/Family not available             Mobility Comments: Patient son reports via telephone that she ambulates with use of a rollator intermittent (mainly at night), does ambulate without AD at times throughout day. ADLs Comments: Per son, Mod I with ADL; son assists with IADL     Extremity/Trunk Assessment   Upper Extremity Assessment Upper Extremity Assessment: Overall WFL for tasks assessed    Lower Extremity Assessment Lower Extremity Assessment: Overall WFL for tasks assessed       Communication   Communication Communication: Difficulty communicating thoughts/reduced clarity of speech  Cognition Arousal: Alert Behavior During Therapy: WFL for tasks assessed/performed Overall Cognitive Status: History of cognitive impairments - at baseline                                          General Comments      Exercises Other Exercises Other Exercises: Assisted patient with set up for lunch tray. Educated on prupose of PT role during set up.   Assessment/Plan    PT Assessment Patient needs continued PT services  PT Problem List Decreased activity tolerance;Decreased balance;Decreased mobility       PT Treatment Interventions DME instruction;Gait training;Stair training;Functional mobility training;Therapeutic activities;Therapeutic exercise;Balance training;Neuromuscular re-education;Patient/family education    PT Goals (Current goals can be found in the Care Plan section)  Acute Rehab PT Goals Patient Stated Goal: Get Home PT Goal Formulation: With patient Time For Goal Achievement:  09/29/23 Potential to Achieve Goals: Good    Frequency Min 1X/week     Co-evaluation               AM-PAC PT "6 Clicks" Mobility  Outcome Measure Help needed turning from your back to your side while in a flat bed without using bedrails?: None Help needed moving from lying on your back to sitting on the side of a flat bed without using bedrails?: None Help needed moving to and from a bed to a chair (including a wheelchair)?: None Help needed standing up from a chair using your arms (e.g., wheelchair or bedside chair)?: A Little Help needed to walk in hospital room?: A Little Help needed climbing 3-5 steps with a railing? : A Little 6 Click Score: 21    End of Session Equipment Utilized During Treatment: Gait belt Activity Tolerance: Patient tolerated treatment well Patient left: in chair;with chair alarm set;with call bell/phone within reach Nurse Communication: Mobility status PT Visit Diagnosis: Unsteadiness on feet (R26.81);Other abnormalities of gait and mobility (R26.89);History of falling (Z91.81)    Time: 1340-1410 PT Time Calculation (min) (ACUTE ONLY): 30 min   Charges:   PT Evaluation $PT Eval Low Complexity: 1 Low PT Treatments $Gait Training: 8-22 mins PT General Charges $$ ACUTE PT VISIT: 1 Visit         Howie Ill, PT, DPT  09/15/23 2:28 PM

## 2023-09-15 NOTE — TOC Progression Note (Signed)
Transition of Care [facility]) - Progression Note    Patient Details  Name: Cindy Huerta MRN: [id number] Date of Birth: Aug 26, 1946  [facility] CM/SW Contact  Marlowe Sax, RN Phone Number: 09/15/2023, 3:31 PM  Clinical Narrative:     Met with the patient in the room and discussed DC planning and needs She lives with her son and daughter in law They provide transportation for her She tells me that has has done [facility] PT in the past and still currently does the exercises they showed her She uses hand weights and a putty to work her hands She stated that she feels that she has all she needs and doe snot feel that she needs HH PT again   Expected Discharge Plan: (P) Home w Home Health Services Barriers to Discharge: Continued Medical Work up  Expected Discharge Plan and Services   Discharge Planning Services: CM Consult   Living arrangements for the past 2 months: Single Family Home                 DME Arranged: N/A DME Agency: NA       HH Arranged: NA           Social Determinants of Health (SDOH) Interventions SDOH Screenings   Food Insecurity: No Food Insecurity (09/13/2023)  Housing: Low Risk  (09/13/2023)  Transportation Needs: No Transportation Needs (09/13/2023)  Utilities: Not At Risk (09/13/2023)  Financial Resource Strain: Patient Declined (12/29/2022)   Received from [facility], [facility]  Tobacco Use: Low Risk  (09/13/2023)    Readmission Risk Interventions    09/13/2023   10:08 AM 06/20/2022    1:01 PM 11/25/2021    1:15 PM  Readmission Risk Prevention Plan  Transportation Screening Complete  Complete  PCP or Specialist Appt within 3-5 Days Complete Complete Complete  HRI or Home Care Consult Complete  Complete  Social Work Consult for Recovery Care Planning/Counseling Complete Complete Complete  Palliative Care Screening Not Applicable Not Applicable Not Applicable  Medication Review Furniture conservator/restorer) Not Complete  Complete  Med Review Comments Patient's son will speak with nurses upon discharge

## 2023-09-15 NOTE — Progress Notes (Signed)
PHARMACIST - PHYSICIAN COMMUNICATION  DR:   Dareen Piano  CONCERNING: IV to Oral Route Change Policy  RECOMMENDATION: This patient is receiving pantoprazole by the intravenous route.  Based on criteria approved by the Pharmacy and Therapeutics Committee, the intravenous medication(s) is/are being converted to the equivalent oral dose form(s).   DESCRIPTION: These criteria include: The patient is eating (either orally or via tube) and/or has been taking other orally administered medications for a least 24 hours The patient has no evidence of active gastrointestinal bleeding or impaired GI absorption (gastrectomy, short bowel, patient on TNA or NPO).  If you have questions about this conversion, please contact the Pharmacy Department    Barrie Folk, [facility] 09/15/2023 11:46 AM

## 2023-09-16 DIAGNOSIS — E871 Hypo-osmolality and hyponatremia: Secondary | ICD-10-CM | POA: Diagnosis not present

## 2023-09-16 DIAGNOSIS — E876 Hypokalemia: Secondary | ICD-10-CM | POA: Diagnosis not present

## 2023-09-16 DIAGNOSIS — A419 Sepsis, unspecified organism: Secondary | ICD-10-CM | POA: Diagnosis not present

## 2023-09-16 LAB — CBC
HCT: 31.3 % — ABNORMAL LOW (ref 36.0–46.0)
Hemoglobin: 10.9 g/dL — ABNORMAL LOW (ref 12.0–15.0)
MCH: 30.8 pg (ref 26.0–34.0)
MCHC: 34.8 g/dL (ref 30.0–36.0)
MCV: 88.4 fL (ref 80.0–100.0)
Platelets: 194 10*3/uL (ref 150–400)
RBC: 3.54 MIL/uL — ABNORMAL LOW (ref 3.87–5.11)
RDW: 12.8 % (ref 11.5–15.5)
WBC: 11.9 10*3/uL — ABNORMAL HIGH (ref 4.0–10.5)
nRBC: 0 % (ref 0.0–0.2)

## 2023-09-16 LAB — BASIC METABOLIC PANEL
Anion gap: 8 (ref 5–15)
BUN: 13 mg/dL (ref 8–23)
CO2: 25 mmol/L (ref 22–32)
Calcium: 8.8 mg/dL — ABNORMAL LOW (ref 8.9–10.3)
Chloride: 101 mmol/L (ref 98–111)
Creatinine, Ser: 1.08 mg/dL — ABNORMAL HIGH (ref 0.44–1.00)
GFR, Estimated: 52 mL/min — ABNORMAL LOW (ref >60)
Glucose, Bld: 94 mg/dL (ref 70–99)
Potassium: 3.5 mmol/L (ref 3.5–5.1)
Sodium: 134 mmol/L — ABNORMAL LOW (ref 135–145)

## 2023-09-16 MED ORDER — SODIUM CHLORIDE 1 G PO TABS: 1.0000 g | ORAL_TABLET | Freq: Two times a day (BID) | ORAL | 0 refills | Status: AC

## 2023-09-16 MED ORDER — FLUOXETINE HCL 10 MG PO CAPS: 10.0000 mg | ORAL_CAPSULE | Freq: Every day | ORAL | 0 refills | Status: DC

## 2023-09-16 NOTE — Discharge Summary (Signed)
Physician Discharge Summary  Patient: Cindy Huerta:[id number] DOB: 1946/10/28   Code Status: Prior Admit date: 09/12/2023 Discharge date: 09/16/2023 Disposition: Relative's home, PT PCP: Eloisa Northern, MD  Recommendations for Outpatient Follow-up:  Follow up with PCP within 1-2 weeks Regarding general hospital follow up and preventative care Recommend metabolic panel. Na+ 134 on day of discharge  Discharge Diagnoses:  Principal Problem:   Hyponatremia Active Problems:   Acute kidney injury superimposed on chronic kidney disease (HCC)   Hypokalemia   Leukocytosis   Elevated troponin   Nausea & vomiting   Abnormal TSH   Sepsis [facility])  Brief Hospital Course Summary: [name] is a 77 y.o. female with medical history significant for hypertension, stage IIIa CKD, CHF with preserved EF, depression, dementia, hypothyroidism, GERD and depression. They presented to the ED on evening of 09/12/2023 for evaluation of recurrent nausea and vomiting.     ED Course: BP was 199/101 with otherwise normal vital signs.  Labs: Na+ 124, Cl- 86, K+ 3.2 and CO2 17, glucose 149 creatinine 1.52. Lactic acid 3.9. WBC 25.3.  Urinalysis: 0-5 WBCs no bacteria and 100 protein.  Blood cultures were drawn. EKG: sinus tachycardia with a rate of 94 with multiple premature complexes ventricular and supraventricular, left axis deviation and prolonged QT interval with QTc 524 MS. Imaging: Not contrast head CT: stable senescent changes with no acute intracranial abnormalities.  Abdominal pelvic CT scan with contrast revealed right perinephric edema with enhancement of the right renal collecting system and ureter suspicious for UTI and chronic diverticulosis without diverticulitis, stable hiatal hernia and aortic atherosclerosis.   The patient was given 500 mL IV normal saline bolus and admitted for management of hyponatremia and AKI from intractable N/V.  Patient's N/V resolved with supportive care. Her  sodium slowly corrected with fluid restriction and salt tablets. IVF were used sparingly due to Sport and exercise psychologist. Fortunately, her AKI resolved with time.  She felt well on day of discharge. PT recommended HH.   Discharge Condition: Good, improved Recommended discharge diet: Regular healthy diet  Consultations: None   Procedures/Studies: none  Allergies as of 09/16/2023       Reactions   Latex Rash   Novocain [procaine] Other (See Comments)   Unsure - told by DDS not to let anyone give it to her  Confusion   Zestril [lisinopril] Cough   Benadryl [diphenhydramine] Other (See Comments)   Jitteriness Agitation   Biaxin [clarithromycin] Other (See Comments)   Confusion   Roxicodone [oxycodone] Nausea And Vomiting, Anxiety        Medication List     STOP taking these medications    diphenhydramine-acetaminophen 25-500 MG Tabs tablet Commonly known as: TYLENOL PM   hydrALAZINE 50 MG tablet Commonly known as: APRESOLINE   triamterene-hydrochlorothiazide 37.5-25 MG tablet Commonly known as: MAXZIDE-25       TAKE these medications    acetaminophen 500 MG tablet Commonly known as: TYLENOL Take 500-1,000 mg by mouth every 6 (six) hours as needed for moderate pain.   amLODipine 10 MG tablet Commonly known as: NORVASC Take 1 tablet (10 mg total) by mouth daily.   Artificial Tears 0.1-0.3 % Soln Generic drug: Dextran 70-Hypromellose Place 1 drop into both eyes daily as needed for dry eyes.   aspirin EC 81 MG tablet Take 1 tablet (81 mg total) by mouth daily. RESTART 48HRS AFTER DISCHARGE   atorvastatin 80 MG tablet Commonly known as: LIPITOR Take 80 mg by mouth daily.  cloNIDine 0.1 MG tablet Commonly known as: CATAPRES Take 0.1 mg by mouth 2 (two) times daily.   FLUoxetine 10 MG capsule Commonly known as: PROZAC Take 1 capsule (10 mg total) by mouth daily. What changed:  medication strength how much to take   fluticasone 50 MCG/ACT nasal  spray Commonly known as: FLONASE Place 1 spray into both nostrils daily as needed for allergies or rhinitis.   IRON PO Take 1 tablet by mouth daily.   levothyroxine 75 MCG tablet Commonly known as: SYNTHROID Take 75 mcg by mouth daily before breakfast.   magnesium oxide 400 MG tablet Commonly known as: MAG-OX Take 1 tablet (400 mg total) by mouth daily.   metoprolol succinate 50 MG 24 hr tablet Commonly known as: TOPROL-XL Take 50 mg by mouth 2 (two) times daily.   pantoprazole 40 MG tablet Commonly known as: PROTONIX Take 1 tablet (40 mg total) by mouth daily.   potassium chloride SA 20 MEQ tablet Commonly known as: KLOR-CON M Take 20 mEq by mouth daily.   sodium chloride 1 g tablet Take 1 tablet (1 g total) by mouth 2 (two) times daily with a meal for 5 days.   trolamine salicylate 10 % cream Commonly known as: ASPERCREME Apply 1 application topically as needed for muscle pain.   VITAMIN D-3 PO Take 1 capsule by mouth daily.       Subjective   Pt reports feeling well. She has no complaints today. She is excited to go home.   All questions and concerns were addressed at time of discharge.  Objective  Blood pressure (!) 140/69, pulse 69, temperature 98.2 F (36.8 C), resp. rate 17, height 5\' 6"  (1.676 m), weight 69.5 kg, SpO2 100%.   General: Pt is alert, awake, not in acute distress Cardiovascular: RRR, S1/S2 +, no rubs, no gallops Respiratory: CTA bilaterally, no wheezing, no rhonchi Abdominal: Soft, NT, ND, bowel sounds + Extremities: no edema, no cyanosis  The results of significant diagnostics from this hospitalization (including imaging, microbiology, ancillary and laboratory) are listed below for reference.   Imaging studies: CT ABDOMEN PELVIS W CONTRAST  Result Date: 09/12/2023 CLINICAL DATA:  Acute abdominal pain.  Nausea and vomiting. EXAM: CT ABDOMEN AND PELVIS WITH CONTRAST TECHNIQUE: Multidetector CT imaging of the abdomen and pelvis was  performed using the standard protocol following bolus administration of intravenous contrast. RADIATION DOSE REDUCTION: This exam was performed according to the departmental dose-optimization program which includes automated exposure control, adjustment of the mA and/or kV according to patient size and/or use of iterative reconstruction technique. CONTRAST:  75mL OMNIPAQUE IOHEXOL 300 MG/ML  SOLN COMPARISON:  CT 08/08/2023 FINDINGS: Lower chest: Stable hiatal hernia.  No basilar airspace disease. Hepatobiliary: Small cyst in the left lobe of the liver, not seen on prior in the absence of IV contrast. Clips in the gallbladder fossa postcholecystectomy. No biliary dilatation. Pancreas: No ductal dilatation or inflammation. Previous stranding adjacent to the pancreatic head has resolved. Spleen: Normal in size without focal abnormality. Adrenals/Urinary Tract: No adrenal nodule. No hydronephrosis. There is right perinephric edema. Enhancement of the right renal collecting system and ureter. No renal fluid collection. No urolithiasis. No bladder wall thickening. Stomach/Bowel: Again seen hiatal hernia. No bowel obstruction or inflammation. Colonic diverticulosis, prominent in the descending colon. No diverticulitis. The appendix not visualized. Vascular/Lymphatic: Aortic atherosclerosis. No aneurysm. Patent portal vein. No adenopathy. Reproductive: Status post hysterectomy. No adnexal masses. Other: No free air, free fluid, or intra-abdominal fluid collection. Musculoskeletal: Chronic degenerative  change in the spine. There are no acute or suspicious osseous abnormalities. IMPRESSION: 1. Right perinephric edema with enhancement of the right renal collecting system and ureter, suspicious for urinary tract infection. No renal fluid collection. 2. Colonic diverticulosis without diverticulitis. 3. Stable hiatal hernia. Aortic Atherosclerosis (ICD10-I70.0). Electronically Signed   By: Narda Rutherford M.D.   On: 09/12/2023  21:59   CT HEAD WO CONTRAST ( )  Result Date: 09/12/2023 CLINICAL DATA:  Headache, new onset (Age >= 51y), nausea, vomiting EXAM: CT HEAD WITHOUT CONTRAST TECHNIQUE: Contiguous axial images were obtained from the base of the skull through the vertex without intravenous contrast. RADIATION DOSE REDUCTION: This exam was performed according to the departmental dose-optimization program which includes automated exposure control, adjustment of the mA and/or kV according to patient size and/or use of iterative reconstruction technique. COMPARISON:  08/08/2023 FINDINGS: Brain: Normal anatomic configuration. Parenchymal volume loss is commensurate with the patient's age. Stable moderate periventricular white matter changes are present likely reflecting the sequela of small vessel ischemia. No abnormal intra or extra-axial mass lesion or fluid collection. No abnormal mass effect or midline shift. No evidence of acute intracranial hemorrhage or infarct. Ventricular size is normal. Cerebellum unremarkable. Vascular: No asymmetric hyperdense vasculature at the skull base. Skull: Intact Sinuses/Orbits: Paranasal sinuses are clear. Orbits are unremarkable. Other: Mastoid air cells and middle ear cavities are clear. IMPRESSION: 1. No acute intracranial hemorrhage or infarct. 2. Stable senescent change. Electronically Signed   By: Helyn Numbers M.D.   On: 09/12/2023 21:58    Labs: Basic Metabolic Panel: Recent Labs  Lab 09/12/23 1951 09/12/23 2209 09/13/23 0406 09/14/23 0636 09/15/23 0516 09/16/23 0522  NA 124*  --  125* 128* 130* 134*  K 3.2*  --  2.8* 2.9* 3.5 3.5  CL 86*  --  89* 96* 99 101  CO2 17*  --  22 22 23 25   GLUCOSE 149*  --  139* 84 85 94  BUN 14  --  17 16 16 13   CREATININE 1.52*  --  1.43* 1.27* 1.20* 1.08*  CALCIUM 10.0  --  9.0 8.3* 8.3* 8.8*  MG  --  1.3*  --  2.2  --   --   PHOS  --  1.2*  --  3.3  --   --    CBC: Recent Labs  Lab 09/12/23 1951 09/13/23 0406 09/14/23 0836  09/16/23 0522  WBC 25.3* 24.1* 14.6* 11.9*  HGB 15.0 12.5 10.5* 10.9*  HCT 41.4 34.9* 29.8* 31.3*  MCV 84.8 85.7 87.1 88.4  PLT 244 261 169 194   Microbiology: Results for orders placed or performed during the hospital encounter of 09/12/23  SARS Coronavirus 2 by RT PCR (hospital order, performed in [facility] hospital lab) *cepheid single result test* Anterior Nasal Swab     Status: None   Collection Time: 09/12/23 11:22 AM   Specimen: Anterior Nasal Swab  Result Value Ref Range Status   SARS Coronavirus 2 by RT PCR NEGATIVE NEGATIVE Final    Comment: (NOTE) SARS-CoV-2 target nucleic acids are NOT DETECTED.  The SARS-CoV-2 RNA is generally detectable in upper and lower respiratory specimens during the acute phase of infection. The lowest concentration of SARS-CoV-2 viral copies this assay can detect is 250 copies / mL. A negative result does not preclude SARS-CoV-2 infection and should not be used as the sole basis for treatment or other patient management decisions.  A negative result may occur with improper specimen collection / handling, submission of  specimen other than nasopharyngeal swab, presence of viral mutation(s) within the areas targeted by this assay, and inadequate number of viral copies (<250 copies / mL). A negative result must be combined with clinical observations, patient history, and epidemiological information.  Fact Sheet for Patients:   RoadLapTop.co.za  Fact Sheet for Healthcare Providers: http://kim-miller.com/  This test is not yet approved or  cleared by the [place] FDA and has been authorized for detection and/or diagnosis of SARS-CoV-2 by FDA under an Emergency Use Authorization (EUA).  This EUA will remain in effect (meaning this test can be used) for the duration of the COVID-19 declaration under Section 564(b)(1) of the Act, 21 U.S.C. section 360bbb-3(b)(1), unless the authorization is  terminated or revoked sooner.  Performed at [facility], [address] [place], [place] [address]   Blood culture (routine x 2)     Status: None (Preliminary result)   Collection Time: 09/12/23 11:30 PM   Specimen: BLOOD  Result Value Ref Range Status   Specimen Description BLOOD BLOOD RIGHT ARM  Final   Special Requests   Final    BOTTLES DRAWN AEROBIC AND ANAEROBIC Blood Culture results may not be optimal due to an inadequate volume of blood received in culture bottles   Culture   Final    NO GROWTH 4 DAYS Performed at [facility], [address], [place], [place] [address]    Report Status PENDING  Incomplete  Blood culture (routine x 2)     Status: None (Preliminary result)   Collection Time: 09/13/23 12:00 AM   Specimen: BLOOD  Result Value Ref Range Status   Specimen Description BLOOD right forearm  Final   Special Requests   Final    BOTTLES DRAWN AEROBIC AND ANAEROBIC Blood Culture results may not be optimal due to an inadequate volume of blood received in culture bottles   Culture   Final    NO GROWTH 4 DAYS Performed at [facility], [address] [place], [place] [address]    Report Status PENDING  Incomplete   Time coordinating discharge: Over 30 minutes  Leeroy Bock, MD  [facility] 09/16/2023, 8:56 PM

## 2023-09-16 NOTE — Plan of Care (Signed)
  Problem: Fluid Volume: Goal: Hemodynamic stability will improve Outcome: Progressing   Problem: Respiratory: Goal: Ability to maintain adequate ventilation will improve Outcome: Progressing   Problem: Clinical Measurements: Goal: Diagnostic test results will improve Outcome: Progressing Goal: Respiratory complications will improve Outcome: Progressing Goal: Cardiovascular complication will be avoided Outcome: Progressing   Problem: Activity: Goal: Risk for activity intolerance will decrease Outcome: Progressing   Problem: Nutrition: Goal: Adequate nutrition will be maintained Outcome: Progressing   Problem: Coping: Goal: Level of anxiety will decrease Outcome: Progressing   Problem: Elimination: Goal: Will not experience complications related to bowel motility Outcome: Progressing Goal: Will not experience complications related to urinary retention Outcome: Progressing   Problem: Pain Managment: Goal: General experience of comfort will improve Outcome: Progressing   Problem: Safety: Goal: Ability to remain free from injury will improve Outcome: Progressing   Problem: Skin Integrity: Goal: Risk for impaired skin integrity will decrease Outcome: Progressing

## 2023-09-16 NOTE — Care Management Important Message (Signed)
Important Message  Patient Details  Name: Cindy Huerta MRN: [id number] Date of Birth: 12-19-1945   Important Message Given:  Yes - Medicare IM     Olegario Messier A Theo Krumholz 09/16/2023, 10:17 AM

## 2023-09-16 NOTE — TOC Progression Note (Signed)
Transition of Care [facility]) - Progression Note    Patient Details  Name: Cindy Huerta MRN: [id number] Date of Birth: [birth year]  Transition of [facility] CM/SW Contact  Hetty Ely, RN Phone Number: 09/16/2023, 11:08 AM  Clinical Narrative: CM called both Son's numbers on file to discuss discharge transport no answer, left message to return call.      Expected Discharge Plan: (P) Home w Home Health Services Barriers to Discharge: Continued Medical Work up  Expected Discharge Plan and Services   Discharge Planning Services: CM Consult   Living arrangements for the past 2 months: Single Family Home Expected Discharge Date: 09/16/23               DME Arranged: N/A DME Agency: NA       HH Arranged: NA           Social Determinants of Health (SDOH) Interventions SDOH Screenings   Food Insecurity: No Food Insecurity (09/13/2023)  Housing: Low Risk  (09/13/2023)  Transportation Needs: No Transportation Needs (09/13/2023)  Utilities: Not At Risk (09/13/2023)  Financial Resource Strain: Patient Declined (12/29/2022)   [facility]  Tobacco Use: Low Risk  (09/13/2023)    Readmission Risk Interventions    09/13/2023   10:08 AM 06/20/2022    1:01 PM 11/25/2021    1:15 PM  Readmission Risk Prevention Plan  Transportation Screening Complete  Complete  PCP or Specialist Appt within 3-5 Days Complete Complete Complete  HRI or Home Care Consult Complete  Complete  Social Work Consult for Recovery Care Planning/Counseling Complete Complete Complete  Palliative Care Screening Not Applicable Not Applicable Not Applicable  Medication Review Oceanographer) Not Complete  Complete  Med Review Comments Patient's son will speak with nurses upon discharge

## 2023-09-18 LAB — CULTURE, BLOOD (ROUTINE X 2)
Culture: NO GROWTH
Culture: NO GROWTH

## 2024-04-19 ENCOUNTER — Ambulatory Visit (HOSPITAL_COMMUNITY)
Admission: EM | Admit: 2024-04-19 | Discharge: 2024-04-19 | Disposition: A | Discharge status: Home / Self Care | Attending: Nurse Practitioner | Admitting: Nurse Practitioner

## 2024-04-19 ENCOUNTER — Ambulatory Visit (HOSPITAL_COMMUNITY)

## 2024-04-19 ENCOUNTER — Encounter (HOSPITAL_COMMUNITY): Payer: Self-pay | Admitting: *Deleted

## 2024-04-19 ENCOUNTER — Other Ambulatory Visit: Payer: Self-pay

## 2024-04-19 DIAGNOSIS — R079 Chest pain, unspecified: Secondary | ICD-10-CM

## 2024-04-19 DIAGNOSIS — I160 Hypertensive urgency: Secondary | ICD-10-CM | POA: Diagnosis not present

## 2024-04-19 NOTE — ED Triage Notes (Signed)
 PT has LT CP pain started on Monday. PT reports SHOB with cough.

## 2024-04-19 NOTE — ED Provider Notes (Signed)
 [facility]    CSN: [id number] Arrival date & time: 04/19/24  1230      History   Chief Complaint Chief Complaint  Patient presents with   Chest Pain    HPI Cindy Huerta is a 78 y.o. female.   Patient presents today for 3-day history of left-sided chest pain.  She also reports pain with any movement of her left arm and has also been coughing and has had some shortness of breath as well.  No fever, chills, runny or stuffy nose, sore throat, headache, abdominal pain, nausea/vomiting, or diarrhea.  No change in appetite.  She has taken extra Tylenol  for the chest pain without much relief.  Chest pain comes and goes with movement and at random times.    Past Medical History:  Diagnosis Date   Arthritis    Breast cancer (HCC)    Depression    GERD (gastroesophageal reflux disease)    HLD (hyperlipidemia)    Hypertension    Hypothyroidism     Patient Active Problem List   Diagnosis Date Noted   Sepsis (HCC) 09/14/2023   Acute kidney injury superimposed on chronic kidney disease (HCC) 09/13/2023   Leukocytosis 09/13/2023   Abnormal TSH 09/13/2023   AKI (acute kidney injury) (HCC) 08/09/2023   Syncope 08/09/2023   Hypomagnesemia 08/09/2023   QT prolongation 08/09/2023   Acute metabolic encephalopathy 08/09/2023   Elevated troponin 08/09/2023   Iron  deficiency anemia 08/17/2022   Elevated lactic acid level 08/17/2022   UTI (urinary tract infection) 08/17/2022   Fall at home, initial encounter 08/17/2022   Abnormal LFTs 08/17/2022   Dehydration 07/27/2022   Malnutrition of moderate degree 06/30/2022   Acute urinary retention 06/29/2022   Acute renal failure superimposed on stage 3b chronic kidney disease (HCC) 06/28/2022   DNR (do not resuscitate) 06/28/2022   Hyponatremia 06/18/2022   Hypokalemia 06/18/2022   Chronic diastolic CHF (congestive heart failure) (HCC) 06/18/2022   Dysuria 06/18/2022   HTN (hypertension) 11/24/2021   Normocytic anemia  11/24/2021   Vertigo 11/24/2021   Breast CA (HCC) 11/22/2021   Breast cancer (HCC) 11/20/2021   Intractable nausea and vomiting 10/09/2021   Weakness 10/07/2021   Carcinoma of overlapping sites of left breast in female, estrogen receptor positive (HCC) 10/06/2021   Dizziness 10/06/2021   Rectal bleeding 10/06/2021   Nausea & vomiting 10/06/2021   GIB (gastrointestinal bleeding) 10/06/2021   Left-sided weakness 07/29/2021   HLD (hyperlipidemia) 07/29/2021   Hypothyroidism 07/29/2021   Left sided numbness 07/29/2021   Depression with anxiety    CKD (chronic kidney disease), stage IIIa    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   Epigastric pain 04/23/2021   GERD (gastroesophageal reflux disease) 04/23/2021   Benign essential hypertension 01/20/2021   History of breast cancer 01/20/2021   History of TIA (transient ischemic attack) 01/20/2021   Palpitations 01/20/2021   Malignant neoplasm of 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) 12/06/2018    Past Surgical History:  Procedure Laterality Date   ABDOMINAL HYSTERECTOMY  1982   AXILLARY SENTINEL NODE BIOPSY Left 11/20/2021   Procedure: AXILLARY SENTINEL NODE BIOPSY;  Surgeon: Conrado Delay, DO;  Location: [facility];  Service: General;  Laterality: Left;   BREAST BIOPSY Left 09/23/2021   3:00 13cmfn venus marker, path pending   BREAST BIOPSY Left 09/23/2021   3:00 14 cmfn heart marker, path pending   CHOLECYSTECTOMY     ESOPHAGOGASTRODUODENOSCOPY (EGD) WITH PROPOFOL  N/A 10/08/2021   Procedure: ESOPHAGOGASTRODUODENOSCOPY (EGD) WITH PROPOFOL ;  Surgeon:  Luke Salaam, MD;  Location: [facility];  Service: Gastroenterology;  Laterality: N/A;   HEMATOMA EVACUATION Left 11/21/2021   Procedure: EVACUATION HEMATOMA;  Surgeon: Conrado Delay, DO;  Location: [facility] OR[facility];  Service: General;  Laterality: Left;   IR FLUORO GUIDE CV LINE LEFT  08/17/2022   IR US  GUIDE VASC ACCESS LEFT  08/17/2022   IR US  GUIDE VASC ACCESS RIGHT  08/17/2022   left  lumpectomy  2019   TOTAL MASTECTOMY Left 11/20/2021   Procedure: TOTAL MASTECTOMY;  Surgeon: Conrado Delay, DO;  Location: [facility] OR[facility];  Service: General;  Laterality: Left;    OB History   No obstetric history on file.      Home Medications    Prior to Admission medications   Medication Sig Start Date End Date Taking? Authorizing Provider  acetaminophen  (TYLENOL ) 500 MG tablet Take 500-1,000 mg by mouth every 6 (six) hours as needed for moderate pain.    [provider]  amLODipine  (NORVASC ) 10 MG tablet Take 1 tablet (10 mg total) by mouth daily. 08/25/22   Amin, Ankit C, MD  ARTIFICIAL TEARS 0.1-0.3 % SOLN Place 1 drop into both eyes daily as needed for dry eyes. 07/15/22   [provider]  aspirin  81 MG EC tablet Take 1 tablet (81 mg total) by mouth daily. RESTART 48HRS AFTER DISCHARGE 11/25/21   Rosea Conch, Isami, DO  atorvastatin  (LIPITOR ) 80 MG tablet Take 80 mg by mouth daily. 09/21/21   [provider]  Cholecalciferol  (VITAMIN D -3 PO) Take 1 capsule by mouth daily.    [provider]  cloNIDine  (CATAPRES ) 0.1 MG tablet Take 0.1 mg by mouth 2 (two) times daily.    [provider]  Ferrous Sulfate  (IRON  PO) Take 1 tablet by mouth daily.    [provider]  FLUoxetine  (PROZAC ) 10 MG capsule Take 1 capsule (10 mg total) by mouth daily. 09/16/23 10/16/23  Ree Candy, MD  fluticasone  (FLONASE ) 50 MCG/ACT nasal spray Place 1 spray into both nostrils daily as needed for allergies or rhinitis.    [provider]  levothyroxine  (SYNTHROID ) 75 MCG tablet Take 75 mcg by mouth daily before breakfast. 02/10/21   [provider]  magnesium  oxide (MAG-OX) 400 MG tablet Take 1 tablet (400 mg total) by mouth daily. 07/30/22   Kraig Peru, MD  metoprolol  succinate (TOPROL -XL) 50 MG 24 hr tablet Take 50 mg by mouth 2 (two) times daily. 03/18/21   [provider]  pantoprazole  (PROTONIX ) 40 MG tablet Take 1 tablet (40 mg  total) by mouth daily. 10/10/21 09/12/23  Tiajuana Fluke, MD  potassium chloride  SA (KLOR-CON  M) 20 MEQ tablet Take 20 mEq by mouth daily.    [provider]  trolamine salicylate (ASPERCREME) 10 % cream Apply 1 application topically as needed for muscle pain.    [provider]    Family History Family History  Problem Relation Age of Onset   Heart disease Mother    Cancer Paternal Grandmother     Social History Social History   Tobacco Use   Smoking status: Never   Smokeless tobacco: Never  Vaping Use   Vaping status: Never Used  Substance Use Topics   Alcohol  use: Never   Drug use: Never     Allergies   Latex, Novocain [procaine], Zestril [lisinopril], Benadryl  [diphenhydramine ], Biaxin [clarithromycin], and Roxicodone [oxycodone]   Review of Systems Review of Systems Per HPI  Physical Exam Triage Vital Signs ED Triage Vitals  Encounter Vitals  Group     BP 04/19/24 1429 (!) 196/84     Systolic BP Percentile --      Diastolic BP Percentile --      Pulse Rate 04/19/24 1429 (!) 57     Resp 04/19/24 1429 20     Temp 04/19/24 1429 98.2 F (36.8 C)     Temp src --      SpO2 04/19/24 1429 98 %     Weight --      Height --      Head Circumference --      Peak Flow --      Pain Score 04/19/24 1427 9     Pain Loc --      Pain Education --      Exclude from Growth Chart --    No data found.  Updated Vital Signs BP (!) 196/84   Pulse (!) 57   Temp 98.2 F (36.8 C)   Resp 20   SpO2 98%   Visual Acuity Right Eye Distance:   Left Eye Distance:   Bilateral Distance:    Right Eye Near:   Left Eye Near:    Bilateral Near:     Physical Exam Vitals and nursing note reviewed.  Constitutional:      General: She is not in acute distress.    Appearance: She is well-developed. She is not toxic-appearing.  HENT:     Head: Normocephalic and atraumatic.     Right Ear: External ear normal.     Left Ear: External ear normal.      Mouth/Throat:     Mouth: Mucous membranes are moist.     Pharynx: Oropharynx is clear.  Cardiovascular:     Rate and Rhythm: Normal rate.     Heart sounds: Normal heart sounds. No murmur heard. Pulmonary:     Effort: Pulmonary effort is normal. No respiratory distress.     Breath sounds: Normal breath sounds. No wheezing, rhonchi or rales.  Chest:     Comments: Patient exquisitely tender to palpation of chest wall; she is grimacing and holding her chest and pain Musculoskeletal:     Cervical back: Normal range of motion.  Lymphadenopathy:     Cervical: No cervical adenopathy.  Skin:    General: Skin is warm and dry.     Capillary Refill: Capillary refill takes less than 2 seconds.     Coloration: Skin is not jaundiced or pale.     Findings: No erythema.  Neurological:     Mental Status: She is alert and oriented to person, place, and time.      UC Treatments / Results  Labs (all labs ordered are listed, but only abnormal results are displayed) Labs Reviewed - No data to display  EKG   Radiology No results found.  Procedures Procedures (including critical care time)  Medications Ordered in UC Medications - No data to display  Initial Impression / Assessment and Plan / UC Course  I have reviewed the triage vital signs and the nursing notes.  Pertinent labs & imaging results that were available during my care of the patient were reviewed by me and considered in my medical decision making (see chart for details).   In triage, patient is hypertensive and bradycardic, otherwise vital signs are stable.  She initially checked in with left arm pain and then told the nurse she was having chest pain, so EKG was obtained.  EKG shows normal sinus rhythm with ventricular rate of 63  bpm without significant ST segment or T wave abnormality.  However, given age, hypertension, and risk factors, I recommended further evaluation and management in the emergency room.  I called her son who  had dropped her off at urgent care with her permission to notify him and he is agreeable to transport her to the emergency room.  Patient is safe to transport via private vehicle at this time. Final Clinical Impressions(s) / UC Diagnoses   Final diagnoses:  Chest pain, unspecified type   Discharge Instructions      Please go directly to the emergency room for evaluation and management of the pain you are having  ED Prescriptions   None    PDMP not reviewed this encounter.   Wilhemena Harbour, NP 04/19/24 1751

## 2024-04-19 NOTE — Discharge Instructions (Signed)
 Please go directly to the emergency room for evaluation and management of the pain you are having

## 2024-04-29 ENCOUNTER — Other Ambulatory Visit: Payer: Self-pay

## 2024-04-29 ENCOUNTER — Emergency Department (HOSPITAL_COMMUNITY)

## 2024-04-29 ENCOUNTER — Inpatient Hospital Stay (HOSPITAL_COMMUNITY)
Admission: EM | Admit: 2024-04-29 | Discharge: 2024-05-02 | DRG: 682 | Disposition: A | Discharge status: Skilled Nursing Facility | Attending: Internal Medicine | Admitting: Internal Medicine

## 2024-04-29 DIAGNOSIS — Z885 Allergy status to narcotic agent status: Secondary | ICD-10-CM

## 2024-04-29 DIAGNOSIS — I160 Hypertensive urgency: Secondary | ICD-10-CM | POA: Diagnosis present

## 2024-04-29 DIAGNOSIS — Z90710 Acquired absence of both cervix and uterus: Secondary | ICD-10-CM

## 2024-04-29 DIAGNOSIS — Z7982 Long term (current) use of aspirin: Secondary | ICD-10-CM | POA: Diagnosis not present

## 2024-04-29 DIAGNOSIS — N1831 Chronic kidney disease, stage 3a: Secondary | ICD-10-CM | POA: Diagnosis present

## 2024-04-29 DIAGNOSIS — Z79890 Hormone replacement therapy (postmenopausal): Secondary | ICD-10-CM | POA: Diagnosis not present

## 2024-04-29 DIAGNOSIS — F32A Depression, unspecified: Secondary | ICD-10-CM | POA: Diagnosis present

## 2024-04-29 DIAGNOSIS — Z8249 Family history of ischemic heart disease and other diseases of the circulatory system: Secondary | ICD-10-CM | POA: Diagnosis not present

## 2024-04-29 DIAGNOSIS — E872 Acidosis, unspecified: Secondary | ICD-10-CM | POA: Diagnosis present

## 2024-04-29 DIAGNOSIS — E785 Hyperlipidemia, unspecified: Secondary | ICD-10-CM | POA: Diagnosis present

## 2024-04-29 DIAGNOSIS — I5032 Chronic diastolic (congestive) heart failure: Secondary | ICD-10-CM | POA: Diagnosis present

## 2024-04-29 DIAGNOSIS — R112 Nausea with vomiting, unspecified: Secondary | ICD-10-CM | POA: Diagnosis present

## 2024-04-29 DIAGNOSIS — I130 Hypertensive heart and chronic kidney disease with heart failure and stage 1 through stage 4 chronic kidney disease, or unspecified chronic kidney disease: Secondary | ICD-10-CM | POA: Diagnosis present

## 2024-04-29 DIAGNOSIS — A419 Sepsis, unspecified organism: Secondary | ICD-10-CM | POA: Diagnosis present

## 2024-04-29 DIAGNOSIS — R404 Transient alteration of awareness: Secondary | ICD-10-CM

## 2024-04-29 DIAGNOSIS — F0393 Unspecified dementia, unspecified severity, with mood disturbance: Secondary | ICD-10-CM | POA: Diagnosis present

## 2024-04-29 DIAGNOSIS — E876 Hypokalemia: Secondary | ICD-10-CM | POA: Diagnosis present

## 2024-04-29 DIAGNOSIS — N179 Acute kidney failure, unspecified: Principal | ICD-10-CM | POA: Diagnosis present

## 2024-04-29 DIAGNOSIS — E860 Dehydration: Secondary | ICD-10-CM | POA: Diagnosis present

## 2024-04-29 DIAGNOSIS — T502X5A Adverse effect of carbonic-anhydrase inhibitors, benzothiadiazides and other diuretics, initial encounter: Secondary | ICD-10-CM | POA: Diagnosis present

## 2024-04-29 DIAGNOSIS — K219 Gastro-esophageal reflux disease without esophagitis: Secondary | ICD-10-CM | POA: Diagnosis present

## 2024-04-29 DIAGNOSIS — Z853 Personal history of malignant neoplasm of breast: Secondary | ICD-10-CM | POA: Diagnosis not present

## 2024-04-29 DIAGNOSIS — Z884 Allergy status to anesthetic agent status: Secondary | ICD-10-CM

## 2024-04-29 DIAGNOSIS — Z888 Allergy status to other drugs, medicaments and biological substances status: Secondary | ICD-10-CM

## 2024-04-29 DIAGNOSIS — Z79899 Other long term (current) drug therapy: Secondary | ICD-10-CM

## 2024-04-29 DIAGNOSIS — E039 Hypothyroidism, unspecified: Secondary | ICD-10-CM | POA: Diagnosis present

## 2024-04-29 DIAGNOSIS — G9341 Metabolic encephalopathy: Secondary | ICD-10-CM | POA: Diagnosis present

## 2024-04-29 DIAGNOSIS — Z91040 Latex allergy status: Secondary | ICD-10-CM

## 2024-04-29 DIAGNOSIS — Z8673 Personal history of transient ischemic attack (TIA), and cerebral infarction without residual deficits: Secondary | ICD-10-CM

## 2024-04-29 DIAGNOSIS — Z9012 Acquired absence of left breast and nipple: Secondary | ICD-10-CM

## 2024-04-29 DIAGNOSIS — E871 Hypo-osmolality and hyponatremia: Secondary | ICD-10-CM | POA: Diagnosis present

## 2024-04-29 DIAGNOSIS — E878 Other disorders of electrolyte and fluid balance, not elsewhere classified: Secondary | ICD-10-CM

## 2024-04-29 DIAGNOSIS — Z881 Allergy status to other antibiotic agents status: Secondary | ICD-10-CM

## 2024-04-29 LAB — URINALYSIS, W/ REFLEX TO CULTURE (INFECTION SUSPECTED)
Bacteria, UA: NONE SEEN
Bilirubin Urine: NEGATIVE
Glucose, UA: NEGATIVE mg/dL
Hgb urine dipstick: NEGATIVE
Ketones, ur: 5 mg/dL — AB
Leukocytes,Ua: NEGATIVE
Nitrite: NEGATIVE
Protein, ur: 100 mg/dL — AB
Specific Gravity, Urine: 1.013 (ref 1.005–1.030)
pH: 7 (ref 5.0–8.0)

## 2024-04-29 LAB — CBC
HCT: 37.4 % (ref 36.0–46.0)
Hemoglobin: 13.4 g/dL (ref 12.0–15.0)
MCH: 30.7 pg (ref 26.0–34.0)
MCHC: 35.8 g/dL (ref 30.0–36.0)
MCV: 85.8 fL (ref 80.0–100.0)
Platelets: 293 10*3/uL (ref 150–400)
RBC: 4.36 MIL/uL (ref 3.87–5.11)
RDW: 13.2 % (ref 11.5–15.5)
WBC: 19.3 10*3/uL — ABNORMAL HIGH (ref 4.0–10.5)
nRBC: 0 % (ref 0.0–0.2)

## 2024-04-29 LAB — DIFFERENTIAL
Abs Immature Granulocytes: 0.09 10*3/uL — ABNORMAL HIGH (ref 0.00–0.07)
Basophils Absolute: 0 10*3/uL (ref 0.0–0.1)
Basophils Relative: 0 %
Eosinophils Absolute: 0 10*3/uL (ref 0.0–0.5)
Eosinophils Relative: 0 %
Immature Granulocytes: 1 %
Lymphocytes Relative: 4 %
Lymphs Abs: 0.8 10*3/uL (ref 0.7–4.0)
Monocytes Absolute: 1.1 10*3/uL — ABNORMAL HIGH (ref 0.1–1.0)
Monocytes Relative: 6 %
Neutro Abs: 17.4 10*3/uL — ABNORMAL HIGH (ref 1.7–7.7)
Neutrophils Relative %: 89 %

## 2024-04-29 LAB — I-STAT VENOUS BLOOD GAS, ED
Acid-Base Excess: 4 mmol/L — ABNORMAL HIGH (ref 0.0–2.0)
Bicarbonate: 23.5 mmol/L (ref 20.0–28.0)
Calcium, Ion: 0.96 mmol/L — ABNORMAL LOW (ref 1.15–1.40)
HCT: 40 % (ref 36.0–46.0)
Hemoglobin: 13.6 g/dL (ref 12.0–15.0)
O2 Saturation: 72 %
Potassium: 2.9 mmol/L — ABNORMAL LOW (ref 3.5–5.1)
Sodium: 124 mmol/L — ABNORMAL LOW (ref 135–145)
TCO2: 24 mmol/L (ref 22–32)
pCO2, Ven: 22.7 mmHg — ABNORMAL LOW (ref 44–60)
pH, Ven: 7.623 (ref 7.25–7.43)
pO2, Ven: 30 mmHg — CL (ref 32–45)

## 2024-04-29 LAB — RAPID URINE DRUG SCREEN, HOSP PERFORMED
Amphetamines: NOT DETECTED
Barbiturates: NOT DETECTED
Benzodiazepines: NOT DETECTED
Cocaine: NOT DETECTED
Opiates: NOT DETECTED
Tetrahydrocannabinol: NOT DETECTED

## 2024-04-29 LAB — MAGNESIUM: Magnesium: 1.1 mg/dL — ABNORMAL LOW (ref 1.7–2.4)

## 2024-04-29 LAB — COMPREHENSIVE METABOLIC PANEL WITH GFR
ALT: 23 U/L (ref 0–44)
AST: 49 U/L — ABNORMAL HIGH (ref 15–41)
Albumin: 4.2 g/dL (ref 3.5–5.0)
Alkaline Phosphatase: 67 U/L (ref 38–126)
Anion gap: 16 — ABNORMAL HIGH (ref 5–15)
BUN: 14 mg/dL (ref 8–23)
CO2: 24 mmol/L (ref 22–32)
Calcium: 9.7 mg/dL (ref 8.9–10.3)
Chloride: 85 mmol/L — ABNORMAL LOW (ref 98–111)
Creatinine, Ser: 1.42 mg/dL — ABNORMAL HIGH (ref 0.44–1.00)
GFR, Estimated: 37 mL/min — ABNORMAL LOW (ref >60)
Glucose, Bld: 159 mg/dL — ABNORMAL HIGH (ref 70–99)
Potassium: 2.9 mmol/L — ABNORMAL LOW (ref 3.5–5.1)
Sodium: 125 mmol/L — ABNORMAL LOW (ref 135–145)
Total Bilirubin: 0.8 mg/dL (ref 0.0–1.2)
Total Protein: 8.4 g/dL — ABNORMAL HIGH (ref 6.5–8.1)

## 2024-04-29 LAB — ETHANOL: Alcohol, Ethyl (B): <15 mg/dL (ref <15)

## 2024-04-29 LAB — I-STAT CHEM 8, ED
BUN: 15 mg/dL (ref 8–23)
Calcium, Ion: 0.98 mmol/L — ABNORMAL LOW (ref 1.15–1.40)
Chloride: 86 mmol/L — ABNORMAL LOW (ref 98–111)
Creatinine, Ser: 1.2 mg/dL — ABNORMAL HIGH (ref 0.44–1.00)
Glucose, Bld: 159 mg/dL — ABNORMAL HIGH (ref 70–99)
HCT: 41 % (ref 36.0–46.0)
Hemoglobin: 13.9 g/dL (ref 12.0–15.0)
Potassium: 3.1 mmol/L — ABNORMAL LOW (ref 3.5–5.1)
Sodium: 124 mmol/L — ABNORMAL LOW (ref 135–145)
TCO2: 23 mmol/L (ref 22–32)

## 2024-04-29 LAB — AMMONIA: Ammonia: 15 umol/L (ref 9–35)

## 2024-04-29 LAB — I-STAT CG4 LACTIC ACID, ED
Lactic Acid, Venous: 4.3 mmol/L (ref 0.5–1.9)
Lactic Acid, Venous: 4.6 mmol/L (ref 0.5–1.9)

## 2024-04-29 LAB — TSH: TSH: 6.045 u[IU]/mL — ABNORMAL HIGH (ref 0.350–4.500)

## 2024-04-29 LAB — OSMOLALITY: Osmolality: 272 mosm/kg — ABNORMAL LOW (ref 275–295)

## 2024-04-29 LAB — PROCALCITONIN: Procalcitonin: 0.16 ng/mL

## 2024-04-29 LAB — LIPASE, BLOOD: Lipase: 22 U/L (ref 11–51)

## 2024-04-29 LAB — PHOSPHORUS: Phosphorus: 1.9 mg/dL — ABNORMAL LOW (ref 2.5–4.6)

## 2024-04-29 LAB — CBG MONITORING, ED: Glucose-Capillary: 168 mg/dL — ABNORMAL HIGH (ref 70–99)

## 2024-04-29 LAB — LACTIC ACID, PLASMA: Lactic Acid, Venous: 3.6 mmol/L (ref 0.5–1.9)

## 2024-04-29 LAB — TROPONIN I (HIGH SENSITIVITY)
Troponin I (High Sensitivity): 27 ng/L — ABNORMAL HIGH (ref <18)
Troponin I (High Sensitivity): 46 ng/L — ABNORMAL HIGH (ref <18)

## 2024-04-29 MED ORDER — ONDANSETRON HCL 4 MG PO TABS: 4.0000 mg | ORAL_TABLET | Freq: Four times a day (QID) | ORAL | Status: DC | PRN

## 2024-04-29 MED ORDER — HYDRALAZINE HCL 20 MG/ML IJ SOLN
10.0000 mg | Freq: Four times a day (QID) | INTRAMUSCULAR | Status: DC | PRN
  Administered 2024-04-29: 10 mg via INTRAVENOUS
  Filled 2024-04-29: qty 1

## 2024-04-29 MED ORDER — SODIUM CHLORIDE 0.9 % IV SOLN: INTRAVENOUS | Status: DC

## 2024-04-29 MED ORDER — HYDRALAZINE HCL 20 MG/ML IJ SOLN
20.0000 mg | Freq: Four times a day (QID) | INTRAMUSCULAR | Status: DC | PRN
  Administered 2024-04-29 – 2024-04-30 (×2): 20 mg via INTRAVENOUS
  Filled 2024-04-29 (×2): qty 1

## 2024-04-29 MED ORDER — SODIUM CHLORIDE 0.9 % IV SOLN
1.0000 g | Freq: Once | INTRAVENOUS | Status: AC
  Administered 2024-04-29: 1 g via INTRAVENOUS
  Filled 2024-04-29: qty 10

## 2024-04-29 MED ORDER — HEPARIN SODIUM (PORCINE) 5000 UNIT/ML IJ SOLN
5000.0000 [IU] | Freq: Three times a day (TID) | INTRAMUSCULAR | Status: DC
  Administered 2024-04-29 – 2024-05-02 (×11): 5000 [IU] via SUBCUTANEOUS
  Filled 2024-04-29 (×11): qty 1

## 2024-04-29 MED ORDER — BUTALBITAL-APAP-CAFFEINE 50-325-40 MG PO TABS: 1.0000 | ORAL_TABLET | Freq: Four times a day (QID) | ORAL | Status: DC | PRN

## 2024-04-29 MED ORDER — ONDANSETRON HCL 4 MG/2ML IJ SOLN
4.0000 mg | Freq: Once | INTRAMUSCULAR | Status: AC
  Administered 2024-04-29: 4 mg via INTRAVENOUS
  Filled 2024-04-29: qty 2

## 2024-04-29 MED ORDER — PROCHLORPERAZINE EDISYLATE 10 MG/2ML IJ SOLN
10.0000 mg | Freq: Four times a day (QID) | INTRAMUSCULAR | Status: DC | PRN
  Administered 2024-04-30: 10 mg via INTRAVENOUS
  Filled 2024-04-29: qty 2

## 2024-04-29 MED ORDER — POTASSIUM CHLORIDE 10 MEQ/100ML IV SOLN
10.0000 meq | INTRAVENOUS | Status: AC
  Administered 2024-04-29 (×2): 10 meq via INTRAVENOUS
  Filled 2024-04-29 (×2): qty 100

## 2024-04-29 MED ORDER — ACETAMINOPHEN 325 MG PO TABS: 650.0000 mg | ORAL_TABLET | Freq: Four times a day (QID) | ORAL | Status: DC | PRN

## 2024-04-29 MED ORDER — POTASSIUM CHLORIDE 10 MEQ/100ML IV SOLN
10.0000 meq | Freq: Once | INTRAVENOUS | Status: AC
  Administered 2024-04-29: 10 meq via INTRAVENOUS
  Filled 2024-04-29: qty 100

## 2024-04-29 MED ORDER — LABETALOL HCL 5 MG/ML IV SOLN
10.0000 mg | Freq: Once | INTRAVENOUS | Status: AC
  Administered 2024-04-29: 10 mg via INTRAVENOUS
  Filled 2024-04-29: qty 4

## 2024-04-29 MED ORDER — ONDANSETRON HCL 4 MG/2ML IJ SOLN
4.0000 mg | Freq: Four times a day (QID) | INTRAMUSCULAR | Status: DC | PRN
  Administered 2024-04-29: 4 mg via INTRAVENOUS
  Filled 2024-04-29: qty 2

## 2024-04-29 MED ORDER — ACETAMINOPHEN 650 MG RE SUPP: 650.0000 mg | Freq: Four times a day (QID) | RECTAL | Status: DC | PRN

## 2024-04-29 MED ORDER — SODIUM CHLORIDE 0.9 % IV BOLUS
1000.0000 mL | Freq: Once | INTRAVENOUS | Status: AC
  Administered 2024-04-29: 1000 mL via INTRAVENOUS

## 2024-04-29 NOTE — H&P (Addendum)
 History and Physical    Cindy Huerta:[id number] DOB: [birth year] DOA: 04/29/2024  PCP: Tita Form, MD  Patient coming from: Home  I have personally briefly reviewed patient's old medical records in [facility]  Chief Complaint: Home  HPI: Cindy Huerta is a 78 y.o. female with medical history significant of hypertension, CKD stage IIIa, CHF with preserved ejection fraction, depression, dementia, hypothyroidism, GERD who presented with nausea, vomiting that started last night.  Patient is not good historian.  History gathered from ED notes.  Apparently she had multiple episodes of vomiting, generalized weakness, lethargic.  Patient called her family and letting them know that she was sick and they called EMS and bring her to the emergency department for further eval and management.  Upon my evaluation: Patient dry heaving, chills and complaining of headache.   ED Course: Upon arrival to ED: Afebrile, pulse 108, RR 32, BP 197/88 and maintaining oxygen saturation on room air.  Workup including BG: 168.  CBC shows leukocytosis of 19.3, H&H 13.4/37.4, PLT 293 ethanol: WNL, NA: 125, K: 2.9, chloride 85, creatinine 1.42, GFR 37 and AST of 49.  Lipase and ammonia level: WNL.  Troponin flat.  UA negative for infection.  Blood culture pending.  Lactic acid: 4.6.  Chest x-ray and CT head, cervical spine, CT abdomen/pelvis all resulted negative.  Patient was given IV fluid bolus, Zofran , labetalol  10 mg and Rocephin  in the ED.  [facility] hospital[facility] consulted for admission.  Review of Systems: As per HPI otherwise negative.    Past Medical History:  Diagnosis Date   Arthritis    Breast cancer (HCC)    Depression    GERD (gastroesophageal reflux disease)    HLD (hyperlipidemia)    Hypertension    Hypothyroidism     Past Surgical History:  Procedure Laterality Date   ABDOMINAL HYSTERECTOMY  1982   AXILLARY SENTINEL NODE BIOPSY Left 11/20/2021   Procedure: AXILLARY SENTINEL NODE BIOPSY;   Surgeon: Conrado Delay, DO;  Location: [facility] ORS;  Service: General;  Laterality: Left;   BREAST BIOPSY Left 09/23/2021   3:00 13cmfn venus marker, path pending   BREAST BIOPSY Left 09/23/2021   3:00 14 cmfn heart marker, path pending   CHOLECYSTECTOMY     ESOPHAGOGASTRODUODENOSCOPY (EGD) WITH PROPOFOL  N/A 10/08/2021   Procedure: ESOPHAGOGASTRODUODENOSCOPY (EGD) WITH PROPOFOL ;  Surgeon: Luke Salaam, MD;  Location: [facility] ENDOSCOPY;  Service: Gastroenterology;  Laterality: N/A;   HEMATOMA EVACUATION Left 11/21/2021   Procedure: EVACUATION HEMATOMA;  Surgeon: Conrado Delay, DO;  Location: [facility] ORS;  Service: General;  Laterality: Left;   IR FLUORO GUIDE CV LINE LEFT  08/17/2022   IR US  GUIDE VASC ACCESS LEFT  08/17/2022   IR US  GUIDE VASC ACCESS RIGHT  08/17/2022   left lumpectomy  2019   TOTAL MASTECTOMY Left 11/20/2021   Procedure: TOTAL MASTECTOMY;  Surgeon: Conrado Delay, DO;  Location: [facility] ORS;  Service: General;  Laterality: Left;     reports that she has never smoked. She has never used smokeless tobacco. She reports that she does not drink alcohol  and does not use drugs.  Allergies  Allergen Reactions   Latex Rash   Novocain [Procaine] Other (See Comments)    Unsure - told by DDS not to let anyone give it to her  Confusion    Zestril [Lisinopril] Cough   Benadryl  [Diphenhydramine ] Other (See Comments)    Jitteriness Agitation   Biaxin [Clarithromycin] Other (See Comments)    Confusion  Roxicodone [Oxycodone] Nausea And Vomiting and Anxiety    Family History  Problem Relation Age of Onset   Heart disease Mother    Cancer Paternal Grandmother     Prior to Admission medications   Medication Sig Start Date End Date Taking? Authorizing Provider  hydrALAZINE  (APRESOLINE ) 50 MG tablet Take 50 mg by mouth 3 (three) times daily. 11/03/23  Yes [provider]  levothyroxine  (SYNTHROID ) 50 MCG tablet Take 50 mcg by mouth daily before breakfast. 02/29/24 02/28/25 Yes  [provider]  meclizine  (ANTIVERT ) 12.5 MG tablet Take 12.5 mg by mouth 3 (three) times daily. 02/13/24  Yes [provider]  acetaminophen  (TYLENOL ) 500 MG tablet Take 500-1,000 mg by mouth every 6 (six) hours as needed for moderate pain.    [provider]  amLODipine  (NORVASC ) 10 MG tablet Take 1 tablet (10 mg total) by mouth daily. 08/25/22   Amin, Ankit C, MD  ARTIFICIAL TEARS 0.1-0.3 % SOLN Place 1 drop into both eyes daily as needed for dry eyes. 07/15/22   [provider]  aspirin  81 MG EC tablet Take 1 tablet (81 mg total) by mouth daily. RESTART 48HRS AFTER DISCHARGE 11/25/21   Rosea Conch, Isami, DO  atorvastatin  (LIPITOR ) 80 MG tablet Take 80 mg by mouth daily. 09/21/21   [provider]  Cholecalciferol  (VITAMIN D -3 PO) Take 1 capsule by mouth daily.    [provider]  cloNIDine  (CATAPRES ) 0.1 MG tablet Take 0.1 mg by mouth 2 (two) times daily.    [provider]  Ferrous Sulfate  (IRON  PO) Take 1 tablet by mouth daily.    [provider]  ferrous sulfate  325 (65 FE) MG EC tablet Take 325 mg by mouth daily with breakfast.    [provider]  FLUoxetine  (PROZAC ) 10 MG capsule Take 1 capsule (10 mg total) by mouth daily. 09/16/23 10/16/23  Ree Candy, MD  fluticasone  (FLONASE ) 50 MCG/ACT nasal spray Place 1 spray into both nostrils daily as needed for allergies or rhinitis.    [provider]  levothyroxine  (SYNTHROID ) 75 MCG tablet Take 75 mcg by mouth daily before breakfast. 02/10/21   [provider]  LORazepam  (ATIVAN ) 0.5 MG tablet Take 0.5 mg by mouth daily.    [provider]  magnesium  oxide (MAG-OX) 400 MG tablet Take 1 tablet (400 mg total) by mouth daily. 07/30/22   Kraig Peru, MD  metoprolol  succinate (TOPROL -XL) 50 MG 24 hr tablet Take 50 mg by mouth 2 (two) times daily. 03/18/21   [provider]  pantoprazole  (PROTONIX ) 40 MG tablet Take 1 tablet (40 mg  total) by mouth daily. 10/10/21 09/12/23  Tiajuana Fluke, MD  potassium chloride  SA (KLOR-CON  M) 20 MEQ tablet Take 20 mEq by mouth daily.    [provider]  telmisartan  (MICARDIS ) 80 MG tablet Take 80 mg by mouth daily.    [provider]  triamterene-hydrochlorothiazide (MAXZIDE-25) 37.5-25 MG tablet Take 1 tablet by mouth daily.    [provider]  trolamine salicylate (ASPERCREME) 10 % cream Apply 1 application topically as needed for muscle pain.    [provider]    Physical Exam: Vitals:   04/29/24 1535 04/29/24 1630 04/29/24 1641 04/29/24 1700  BP:  (!) 197/93 (!) 197/93 (!) 203/85  Pulse:  94 (!) 110 88  Resp:   (!) 42 19  Temp: 99.2 F (37.3 C)     TempSrc: Oral     SpO2:  100% 100% 100%  Constitutional: Dry heaving, appears very weak, sick and lethargic Eyes: PERRL, lids and conjunctivae normal ENMT: Mucous membranes are moist. Posterior pharynx clear of any exudate or lesions.Normal dentition.  Neck: normal, supple, no masses, no thyromegaly Respiratory: Tachypneic.  No crackles or wheezing  cardiovascular: Tachycardic, no murmur gallops or rubs.  No leg edema Abdomen: Abdomen soft, mild abdominal tenderness positive, no guarding, no rigidity. Musculoskeletal: no clubbing / cyanosis. No joint deformity upper and lower extremities. Good ROM, no contractures. Normal muscle tone.  Skin: no rashes, lesions, ulcers. No induration Neurologic: Alert but confused.   Labs on Admission: I have personally reviewed following labs and imaging studies  CBC: Recent Labs  Lab 04/29/24 1300 04/29/24 1305 04/29/24 1306  WBC 19.3*  --   --   NEUTROABS 17.4*  --   --   HGB 13.4 13.6 13.9  HCT 37.4 40.0 41.0  MCV 85.8  --   --   PLT 293  --   --    Basic Metabolic Panel: Recent Labs  Lab 04/29/24 1300 04/29/24 1305 04/29/24 1306  NA 125* 124* 124*  K 2.9* 2.9* 3.1*  CL 85*  --  86*  CO2 24  --   --   GLUCOSE 159*  --  159*   BUN 14  --  15  CREATININE 1.42*  --  1.20*  CALCIUM  9.7  --   --    GFR: CrCl cannot be calculated (Unknown ideal weight.). Liver Function Tests: Recent Labs  Lab 04/29/24 1300  AST 49*  ALT 23  ALKPHOS 67  BILITOT 0.8  PROT 8.4*  ALBUMIN 4.2   Recent Labs  Lab 04/29/24 1300  LIPASE 22   Recent Labs  Lab 04/29/24 1300  AMMONIA 15   Coagulation Profile: No results for input(s): "INR", "PROTIME" in the last 168 hours. Cardiac Enzymes: No results for input(s): "CKTOTAL", "CKMB", "CKMBINDEX", "TROPONINI" in the last 168 hours. BNP (last 3 results) No results for input(s): "PROBNP" in the last 8760 hours. HbA1C: No results for input(s): "HGBA1C" in the last 72 hours. CBG: Recent Labs  Lab 04/29/24 1244  GLUCAP 168*   Lipid Profile: No results for input(s): "CHOL", "HDL", "LDLCALC", "TRIG", "CHOLHDL", "LDLDIRECT" in the last 72 hours. Thyroid  Function Tests: No results for input(s): "TSH", "T4TOTAL", "FREET4", "T3FREE", "THYROIDAB" in the last 72 hours. Anemia Panel: No results for input(s): "VITAMINB12", "FOLATE", "FERRITIN", "TIBC", "IRON ", "RETICCTPCT" in the last 72 hours. Urine analysis:    Component Value Date/Time   COLORURINE YELLOW 04/29/2024 1334   APPEARANCEUR CLEAR 04/29/2024 1334   LABSPEC 1.013 04/29/2024 1334   PHURINE 7.0 04/29/2024 1334   GLUCOSEU NEGATIVE 04/29/2024 1334   HGBUR NEGATIVE 04/29/2024 1334   BILIRUBINUR NEGATIVE 04/29/2024 1334   KETONESUR 5 (A) 04/29/2024 1334   PROTEINUR 100 (A) 04/29/2024 1334   NITRITE NEGATIVE 04/29/2024 1334   LEUKOCYTESUR NEGATIVE 04/29/2024 1334    Radiological Exams on Admission: CT ABDOMEN PELVIS WO CONTRAST Result Date: 04/29/2024 CLINICAL DATA:  Acute abdominal pain. EXAM: CT ABDOMEN AND PELVIS WITHOUT CONTRAST TECHNIQUE: Multidetector CT imaging of the abdomen and pelvis was performed following the standard protocol without IV contrast. RADIATION DOSE REDUCTION: This exam was performed  according to the departmental dose-optimization program which includes automated exposure control, adjustment of the mA and/or kV according to patient size and/or use of iterative reconstruction technique. COMPARISON:  09/12/2023 FINDINGS: Lower chest: Heart size is normal. Three-vessel atherosclerotic coronary artery disease. Calcified plaque over the descending thoracic aorta. Lung bases  are clear. Hepatobiliary: Prior cholecystectomy. Liver and biliary tree are normal. Pancreas: Mild fatty replacement of the pancreas which is otherwise normal. Spleen: Normal. Adrenals/Urinary Tract: Adrenal glands are normal. Kidneys are normal in size without hydronephrosis or nephrolithiasis. Very minimal symmetric stranding of the perinephric fat. Ureters and bladder are normal. Stomach/Bowel: Stomach and small bowel are normal. Appendix is not definitely visualized. There is diverticulosis of the colon most prominent over the descending colon. No evidence of diverticulitis. Vascular/Lymphatic: Mild calcified plaque over the abdominal aorta which is normal in caliber. No adenopathy. Reproductive: Status post hysterectomy. No adnexal masses. Other: No evidence of focal inflammatory change. Musculoskeletal: No acute findings. Mild degenerative change of the spine and hips. IMPRESSION: 1. No acute findings in the abdomen/pelvis. 2. Colonic diverticulosis without evidence of diverticulitis. 3. Aortic atherosclerosis. Three-vessel atherosclerotic coronary artery disease. Aortic Atherosclerosis (ICD10-I70.0). Electronically Signed   By: Roda Cirri M.D.   On: 04/29/2024 15:36   CT Cervical Spine Wo Contrast Result Date: 04/29/2024 EXAM: CT CERVICAL SPINE WITHOUT CONTRAST 04/29/2024 03:01:22 PM TECHNIQUE: CT of the cervical was performed without the administration of intravenous contrast. Multiplanar reformatted images are provided for review. Automated exposure control, iterative reconstruction, and/or weight based adjustment  of the mA/kV was utilized to reduce the radiation dose to as low as reasonably achievable. COMPARISON: 08/17/2022 CLINICAL HISTORY: Neck trauma (Age >= 65y). Nausea and vomiting since last night. FINDINGS: CERVICAL SPINE: BONES/ALIGNMENT: There is no acute fracture or traumatic malalignment. DEGENERATIVE CHANGES: Multilevel degenerative changes of the cervical spine are similar to the prior study. SOFT TISSUES: There is no prevertebral soft tissue swelling. IMPRESSION: 1. No acute abnormality of the cervical spine related to the reported neck trauma. 2. Multilevel degenerative changes in the cervical spine, similar to the prior study. Electronically signed by: Audree Leas MD 04/29/2024 03:33 PM EDT RP Workstation: [id number]   CT HEAD WO CONTRAST Result Date: 04/29/2024 EXAM: CT HEAD WITHOUT 04/29/2024 03:01:22 PM TECHNIQUE: CT of the head was performed without the administration of intravenous contrast. Automated exposure control, iterative reconstruction, and/or weight based adjustment of the mA/kV was utilized to reduce the radiation dose to as low as reasonably achievable. COMPARISON: CT head without contrast 09/12/2023 CLINICAL HISTORY: Mental status change, unknown cause. Nausea and vomiting since last night. FINDINGS: BRAIN AND VENTRICLES: There is no acute intracranial hemorrhage, mass effect or midline shift. No abnormal extra-axial fluid collection. The gray-white differentiation is maintained without evidence of an acute infarct. There is no evidence of hydrocephalus. Mild atrophy and moderate diffuse white matter changes are similar to the prior exam. ORBITS: The visualized portion of the orbits demonstrate no acute abnormality. SINUSES: The visualized paranasal sinuses and mastoid air cells demonstrate no acute abnormality. SOFT TISSUES AND SKULL: No acute abnormality of the visualized skull or soft tissues. Atherosclerotic calcifications are present in the cavernous carotid arteries  bilaterally. No hyperdense vessel is present. IMPRESSION: 1. No acute intracranial abnormality. 2. Mild atrophy and moderate diffuse white matter changes, similar to the prior exam. 3. Atherosclerotic calcifications in the cavernous carotid arteries bilaterally. No hyperdense vessel. Electronically signed by: Audree Leas MD 04/29/2024 03:31 PM EDT RP Workstation: [id number]   DG Chest Portable 1 View Result Date: 04/29/2024 EXAM: 1 VIEW XRAY OF THE CHEST 04/29/2024 01:23:31 PM COMPARISON: 11/16/2022 CLINICAL HISTORY: Fall, pain. Altered mental status, nausea and vomiting. Hypertension. FINDINGS: LUNGS AND PLEURA: Lung volumes are low. No focal pulmonary opacity. No pulmonary edema. No pleural effusion. No pneumothorax. HEART AND MEDIASTINUM: No acute abnormality  of the cardiac and mediastinal silhouettes. BONES AND SOFT TISSUES: No acute osseous abnormality. IMPRESSION: 1. No acute process. 2. Low lung volumes. Electronically signed by: Audree Leas MD 04/29/2024 01:51 PM EDT RP Workstation: [id number]   DG Shoulder Right Portable Result Date: 04/29/2024 CLINICAL DATA:  Fall with right shoulder pain. EXAM: RIGHT SHOULDER - 1 VIEW COMPARISON:  None Available. FINDINGS: Minimal degenerate change of the [facility] joint. Minimal degenerative change of the glenohumeral joint. No acute fracture or dislocation. Remainder the exam is unremarkable. IMPRESSION: 1. No acute findings. 2. Minimal degenerative changes. Electronically Signed   By: Roda Cirri M.D.   On: 04/29/2024 13:50    EKG: Independently reviewed. NSR, No ST-T wave changes  Assessment/Plan  Intractable nausea and vomiting: - Lipase: WNL.  CT abdomen/pelvis, chest x-ray, UA negative for infection. - Patient tachycardic, tachypneic and lactic acid elevated likely due to dehydration.  No source of infection identified.  Has leukocytosis but she is afebrile. - Received IV fluid bolus and Rocephin  and Zofran  in the ED. - Admit patient to  stepdown unit..   - Monitor vitals closely. - Will hold off antibiotics for now-will follow pending blood culture.  Acute metabolic encephalopathy: - Likely in the setting of electrolyte imbalance and dehydration. - CT head and cervical spine negative for any acute findings.  UDS negative.  Ammonia and ethanol: WNL.  UA negative for infection. - Keep NPO. - Consult PT/OT  Hypertensive urgency: - Blood pressure elevated upon arrival.  Given labetalol  in the ED. - Start hydralazine  as needed.  Monitor blood pressure closely -  Hyponatremia: Sodium 125 likely in the setting of dehydration - Continue IV fluids and monitor closely - Check urine sodium and osmolality.  Hypokalemia: Replenished. - Check magnesium .  Repeat BMP tomorrow a.m.  AKI on CKD stage IIIa: - Baseline creatinine 1.08.  Patient presented with 1.42. - Continue IV fluids.  Avoid nephrotoxic medications.  Elevated troponin: - Flat.  No chest pain.  Monitor for now.  Hypothyroidism: Check TSH.  History of TIA: On aspirin  and statin  Depression: On Prozac   Unable to resume home medications since med rec review pending by pharmacy   DVT prophylaxis: Heparin  Code Status: Full code Family Communication: None present at bedside.    I called patient's son and updated plan of care and he verbalized understanding.  Disposition Plan: To be determined Consults called: None Admission status: In patient   Elester Grim MD [facility]  If 7PM-7AM, please contact night-coverage [email or web address]  04/29/2024, 5:44 PM

## 2024-04-29 NOTE — ED Notes (Signed)
 CCMD notified via telephone.

## 2024-04-29 NOTE — ED Notes (Signed)
 CCMD called to admit patient for cardiac monitoring.

## 2024-04-29 NOTE — ED Triage Notes (Signed)
 Pt BIBEMS from home, complaints of n/v. Pt called family, family called EMS. Started late last night, altered, and lethargic. Able to follow commands. GCS 14. Pt has been dry heaving with EMS 4mg  -IM Zofran  in route. 12 lead unremarkable. Pt knows her name, DOB, and that she is at the hospital. Unable to tell the day or year.   230/108 BP 116 HR 18-20 RR 159 CBG

## 2024-04-29 NOTE — ED Notes (Addendum)
 First set of blood cultures collected prior to ABX start

## 2024-04-29 NOTE — ED Notes (Signed)
 Dr Monnie Anthony is aware of abnormal lab I-stat lactic and venous blood gas

## 2024-04-29 NOTE — ED Provider Notes (Signed)
 [place] EMERGENCY DEPARTMENT AT [facility] Provider Note   CSN: [id number] Arrival date & time: 04/29/24  1221     History {Add pertinent medical, surgical, social history, OB history to HPI:1}   Cindy Huerta is a 78 y.o. female.     Patient has history of hypertension hyperlipidemia hypothyroidism depression breast cancer acid reflux sepsis.  Records show that patient was seen at an urgent care on the 15th with complaints of coughing and arm pain.  She was also having chest pain.  Patient was noted significant chest wall tenderness.  She was instructed to go to the emergency room.  Patient started having trouble with nausea and vomiting last night.  She has had multiple episodes.  She started feeling weak lethargic.  Patient called her family letting them know that she was sick and they called EMS to bring her to the hospital.  Home Medications Prior to Admission medications   Medication Sig Start Date End Date Taking? Authorizing Provider  acetaminophen  (TYLENOL ) 500 MG tablet Take 500-1,000 mg by mouth every 6 (six) hours as needed for moderate pain.    [provider]  amLODipine  (NORVASC ) 10 MG tablet Take 1 tablet (10 mg total) by mouth daily. 08/25/22   Amin, Ankit C, MD  ARTIFICIAL TEARS 0.1-0.3 % SOLN Place 1 drop into both eyes daily as needed for dry eyes. 07/15/22   [provider]  aspirin  81 MG EC tablet Take 1 tablet (81 mg total) by mouth daily. RESTART 48HRS AFTER DISCHARGE 11/25/21   Rosea Conch, Isami, DO  atorvastatin  (LIPITOR ) 80 MG tablet Take 80 mg by mouth daily. 09/21/21   [provider]  Cholecalciferol  (VITAMIN D -3 PO) Take 1 capsule by mouth daily.    [provider]  cloNIDine  (CATAPRES ) 0.1 MG tablet Take 0.1 mg by mouth 2 (two) times daily.    [provider]  Ferrous Sulfate  (IRON  PO) Take 1 tablet by mouth daily.    [provider]  FLUoxetine  (PROZAC ) 10 MG capsule Take 1 capsule (10 mg  total) by mouth daily. 09/16/23 10/16/23  Ree Candy, MD  fluticasone  (FLONASE ) 50 MCG/ACT nasal spray Place 1 spray into both nostrils daily as needed for allergies or rhinitis.    [provider]  levothyroxine  (SYNTHROID ) 75 MCG tablet Take 75 mcg by mouth daily before breakfast. 02/10/21   [provider]  magnesium  oxide (MAG-OX) 400 MG tablet Take 1 tablet (400 mg total) by mouth daily. 07/30/22   Kraig Peru, MD  metoprolol  succinate (TOPROL -XL) 50 MG 24 hr tablet Take 50 mg by mouth 2 (two) times daily. 03/18/21   [provider]  pantoprazole  (PROTONIX ) 40 MG tablet Take 1 tablet (40 mg total) by mouth daily. 10/10/21 09/12/23  Tiajuana Fluke, MD  potassium chloride  SA (KLOR-CON  M) 20 MEQ tablet Take 20 mEq by mouth daily.    [provider]  trolamine salicylate (ASPERCREME) 10 % cream Apply 1 application topically as needed for muscle pain.    [provider]      Allergies    Latex, Novocain [procaine], Zestril [lisinopril], Benadryl  [diphenhydramine ], Biaxin [clarithromycin], and Roxicodone [oxycodone]    Review of Systems   Review of Systems  Physical Exam Updated Vital Signs BP (!) 205/101   Pulse (!) 108   Temp 98.6 F (37 C)   Resp (!) 33   SpO2 100%  Physical Exam Vitals and nursing note reviewed.  Constitutional:  Appearance: She is well-developed. She is ill-appearing. She is not diaphoretic.  HENT:     Head: Normocephalic and atraumatic.     Right Ear: External ear normal.     Left Ear: External ear normal.  Eyes:     General: No scleral icterus.       Right eye: No discharge.        Left eye: No discharge.     Conjunctiva/sclera: Conjunctivae normal.  Neck:     Trachea: No tracheal deviation.  Cardiovascular:     Rate and Rhythm: Normal rate and regular rhythm.  Pulmonary:     Effort: Pulmonary effort is normal. No respiratory distress.     Breath sounds: Normal breath sounds. No stridor. No  wheezing or rales.  Abdominal:     General: Bowel sounds are normal. There is no distension.     Palpations: Abdomen is soft.     Tenderness: There is abdominal tenderness. There is no guarding or rebound.  Musculoskeletal:        General: Tenderness present. No deformity.     Cervical back: Normal range of motion and neck supple. No rigidity.     Comments: Tenderness palpation right shoulder, pain with range of motion  Skin:    General: Skin is warm and dry.     Findings: No rash.  Neurological:     Cranial Nerves: No cranial nerve deficit, dysarthria or facial asymmetry.     Sensory: No sensory deficit.     Motor: Weakness present. No abnormal muscle tone or seizure activity.     Coordination: Coordination normal.     Comments: Patient able to lift both arms off the bed, patient is able to lift both legs off the bed, patient is slow to answer questions.  She will answer with a few words at a time.  Patient is aware that she is in the hospital.  Confused about the date  Psychiatric:        Mood and Affect: Mood normal.    ED Results / Procedures / Treatments   Labs (all labs ordered are listed, but only abnormal results are displayed) Labs Reviewed  CBC - Abnormal; Notable for the following components:      Result Value   WBC 19.3 (*)    All other components within normal limits  DIFFERENTIAL - Abnormal; Notable for the following components:   Neutro Abs 17.4 (*)    Monocytes Absolute 1.1 (*)    Abs Immature Granulocytes 0.09 (*)    All other components within normal limits  COMPREHENSIVE METABOLIC PANEL WITH GFR - Abnormal; Notable for the following components:   Sodium 125 (*)    Potassium 2.9 (*)    Chloride 85 (*)    Glucose, Bld 159 (*)    Creatinine, Ser 1.42 (*)    Total Protein 8.4 (*)    AST 49 (*)    GFR, Estimated 37 (*)    Anion gap 16 (*)    All other components within normal limits  URINALYSIS, W/ REFLEX TO CULTURE (INFECTION SUSPECTED) - Abnormal; Notable  for the following components:   Ketones, ur 5 (*)    Protein, ur 100 (*)    All other components within normal limits  CBG MONITORING, ED - Abnormal; Notable for the following components:   Glucose-Capillary 168 (*)    All other components within normal limits  I-STAT CHEM 8, ED - Abnormal; Notable for the following components:   Sodium 124 (*)  Potassium 3.1 (*)    Chloride 86 (*)    Creatinine, Ser 1.20 (*)    Glucose, Bld 159 (*)    Calcium , Ion 0.98 (*)    All other components within normal limits  I-STAT VENOUS BLOOD GAS, ED - Abnormal; Notable for the following components:   pH, Ven 7.623 (*)    pCO2, Ven 22.7 (*)    pO2, Ven 30 (*)    Acid-Base Excess 4.0 (*)    Sodium 124 (*)    Potassium 2.9 (*)    Calcium , Ion 0.96 (*)    All other components within normal limits  I-STAT CG4 LACTIC ACID, ED - Abnormal; Notable for the following components:   Lactic Acid, Venous 4.3 (*)    All other components within normal limits  I-STAT CG4 LACTIC ACID, ED - Abnormal; Notable for the following components:   Lactic Acid, Venous 4.6 (*)    All other components within normal limits  TROPONIN I (HIGH SENSITIVITY) - Abnormal; Notable for the following components:   Troponin I (High Sensitivity) 27 (*)    All other components within normal limits  CULTURE, BLOOD (ROUTINE X 2)  CULTURE, BLOOD (ROUTINE X 2)  ETHANOL  RAPID URINE DRUG SCREEN, HOSP PERFORMED  LIPASE, BLOOD  AMMONIA  LACTIC ACID, PLASMA  TROPONIN I (HIGH SENSITIVITY)    EKG EKG Interpretation Date/Time:  Sunday Apr 29 2024 12:31:24 EDT Ventricular Rate:  85 PR Interval:  175 QRS Duration:  83 QT Interval:  407 QTC Calculation: 484 R Axis:   -28  Text Interpretation: Sinus rhythm Probable left atrial enlargement Probable left ventricular hypertrophy Since last tracing rate faster Confirmed by Trish Furl [phone]) on 04/29/2024 1:25:14 PM  Radiology CT ABDOMEN PELVIS WO CONTRAST Result Date: 04/29/2024 CLINICAL  DATA:  Acute abdominal pain. EXAM: CT ABDOMEN AND PELVIS WITHOUT CONTRAST TECHNIQUE: Multidetector CT imaging of the abdomen and pelvis was performed following the standard protocol without IV contrast. RADIATION DOSE REDUCTION: This exam was performed according to the departmental dose-optimization program which includes automated exposure control, adjustment of the mA and/or kV according to patient size and/or use of iterative reconstruction technique. COMPARISON:  09/12/2023 FINDINGS: Lower chest: Heart size is normal. Three-vessel atherosclerotic coronary artery disease. Calcified plaque over the descending thoracic aorta. Lung bases are clear. Hepatobiliary: Prior cholecystectomy. Liver and biliary tree are normal. Pancreas: Mild fatty replacement of the pancreas which is otherwise normal. Spleen: Normal. Adrenals/Urinary Tract: Adrenal glands are normal. Kidneys are normal in size without hydronephrosis or nephrolithiasis. Very minimal symmetric stranding of the perinephric fat. Ureters and bladder are normal. Stomach/Bowel: Stomach and small bowel are normal. Appendix is not definitely visualized. There is diverticulosis of the colon most prominent over the descending colon. No evidence of diverticulitis. Vascular/Lymphatic: Mild calcified plaque over the abdominal aorta which is normal in caliber. No adenopathy. Reproductive: Status post hysterectomy. No adnexal masses. Other: No evidence of focal inflammatory change. Musculoskeletal: No acute findings. Mild degenerative change of the spine and hips. IMPRESSION: 1. No acute findings in the abdomen/pelvis. 2. Colonic diverticulosis without evidence of diverticulitis. 3. Aortic atherosclerosis. Three-vessel atherosclerotic coronary artery disease. Aortic Atherosclerosis (ICD10-I70.0). Electronically Signed   By: Roda Cirri M.D.   On: 04/29/2024 15:36   CT Cervical Spine Wo Contrast Result Date: 04/29/2024 EXAM: CT CERVICAL SPINE WITHOUT CONTRAST  04/29/2024 03:01:22 PM TECHNIQUE: CT of the cervical was performed without the administration of intravenous contrast. Multiplanar reformatted images are provided for review. Automated exposure control, iterative reconstruction,  and/or weight based adjustment of the mA/kV was utilized to reduce the radiation dose to as low as reasonably achievable. COMPARISON: 08/17/2022 CLINICAL HISTORY: Neck trauma (Age >= 65y). Nausea and vomiting since last night. FINDINGS: CERVICAL SPINE: BONES/ALIGNMENT: There is no acute fracture or traumatic malalignment. DEGENERATIVE CHANGES: Multilevel degenerative changes of the cervical spine are similar to the prior study. SOFT TISSUES: There is no prevertebral soft tissue swelling. IMPRESSION: 1. No acute abnormality of the cervical spine related to the reported neck trauma. 2. Multilevel degenerative changes in the cervical spine, similar to the prior study. Electronically signed by: Audree Leas MD 04/29/2024 03:33 PM EDT RP Workstation: [id number]   CT HEAD WO CONTRAST Result Date: 04/29/2024 EXAM: CT HEAD WITHOUT 04/29/2024 03:01:22 PM TECHNIQUE: CT of the head was performed without the administration of intravenous contrast. Automated exposure control, iterative reconstruction, and/or weight based adjustment of the mA/kV was utilized to reduce the radiation dose to as low as reasonably achievable. COMPARISON: CT head without contrast 09/12/2023 CLINICAL HISTORY: Mental status change, unknown cause. Nausea and vomiting since last night. FINDINGS: BRAIN AND VENTRICLES: There is no acute intracranial hemorrhage, mass effect or midline shift. No abnormal extra-axial fluid collection. The gray-white differentiation is maintained without evidence of an acute infarct. There is no evidence of hydrocephalus. Mild atrophy and moderate diffuse white matter changes are similar to the prior exam. ORBITS: The visualized portion of the orbits demonstrate no acute abnormality. SINUSES:  The visualized paranasal sinuses and mastoid air cells demonstrate no acute abnormality. SOFT TISSUES AND SKULL: No acute abnormality of the visualized skull or soft tissues. Atherosclerotic calcifications are present in the cavernous carotid arteries bilaterally. No hyperdense vessel is present. IMPRESSION: 1. No acute intracranial abnormality. 2. Mild atrophy and moderate diffuse white matter changes, similar to the prior exam. 3. Atherosclerotic calcifications in the cavernous carotid arteries bilaterally. No hyperdense vessel. Electronically signed by: Audree Leas MD 04/29/2024 03:31 PM EDT RP Workstation: [id number]   DG Chest Portable 1 View Result Date: 04/29/2024 EXAM: 1 VIEW XRAY OF THE CHEST 04/29/2024 01:23:31 PM COMPARISON: 11/16/2022 CLINICAL HISTORY: Fall, pain. Altered mental status, nausea and vomiting. Hypertension. FINDINGS: LUNGS AND PLEURA: Lung volumes are low. No focal pulmonary opacity. No pulmonary edema. No pleural effusion. No pneumothorax. HEART AND MEDIASTINUM: No acute abnormality of the cardiac and mediastinal silhouettes. BONES AND SOFT TISSUES: No acute osseous abnormality. IMPRESSION: 1. No acute process. 2. Low lung volumes. Electronically signed by: Audree Leas MD 04/29/2024 01:51 PM EDT RP Workstation: [id number]   DG Shoulder Right Portable Result Date: 04/29/2024 CLINICAL DATA:  Fall with right shoulder pain. EXAM: RIGHT SHOULDER - 1 VIEW COMPARISON:  None Available. FINDINGS: Minimal degenerate change of the [facility] joint. Minimal degenerative change of the glenohumeral joint. No acute fracture or dislocation. Remainder the exam is unremarkable. IMPRESSION: 1. No acute findings. 2. Minimal degenerative changes. Electronically Signed   By: Roda Cirri M.D.   On: 04/29/2024 13:50    Procedures .Critical Care  Performed by: Trish Furl, MD Authorized by: Trish Furl, MD   Critical care provider statement:    Critical care time (minutes):  45   Critical  care was time spent personally by me on the following activities:  Development of treatment plan with patient or surrogate, discussions with consultants, evaluation of patient's response to treatment, examination of patient, ordering and review of laboratory studies, ordering and review of radiographic studies, ordering and performing treatments and interventions, pulse oximetry, re-evaluation of patient's condition and review of  old charts   {Document cardiac monitor, telemetry assessment procedure when appropriate:1}  Medications Ordered in ED Medications  labetalol  (NORMODYNE ) injection 10 mg (has no administration in time range)  potassium chloride  10 mEq in 100 mL IVPB (has no administration in time range)  sodium chloride  0.9 % bolus 1,000 mL (has no administration in time range)  ondansetron  (ZOFRAN ) injection 4 mg (4 mg Intravenous Given 04/29/24 1304)  cefTRIAXone  (ROCEPHIN ) 1 g in sodium chloride  0.9 % 100 mL IVPB (0 g Intravenous Stopped 04/29/24 1451)    ED Course/ Medical Decision Making/ A&P Clinical Course as of 04/29/24 1603  Sun Apr 29, 2024  1322 Venous gas shows increased pH.  Decreased pCO2.  Possible hyperventilation. [JK]  1322 The lactic acid level is elevated at 4.3. [JK]  1323 I-stat chem 8, ED(!) I-STAT shows low sodium slightly increased creatinine [JK]  1452 Comprehensive metabolic panel(!) Hyponatremia noted worse compared to previous values although she does have chronic hyponatremia [JK]  1452 Comprehensive metabolic panel(!) [JK]  1452 CBC(!) Leukocytosis noted with a white count of 19.3 [JK]  1553 CT of the abdomen pelvis does not show any acute finding. [JK]  1553 Head CT and C-spine CT without acute findings [JK]  1556 Reviewed findings with patient.  She states she still feeling nauseous.  Will give additional antinausea medications.  Blood pressure still noted to be elevated.  Will give a dose of labetalol  [JK]    Clinical Course User Index [JK] Trish Furl, MD   {   Click here for ABCD2, HEART and other calculatorsREFRESH Note before signing :1}                              Medical Decision Making Frontal diagnosis includes but not limited to cerebral hemorrhage, stroke, metabolic abnormality, sepsis  Amount and/or Complexity of Data Reviewed Labs: ordered. Decision-making details documented in ED Course. Radiology: ordered.  Risk Prescription drug management.   Patient presented to the ED for evaluation of nausea vomiting weakness.  Patient noted to have a significant mount of dry heaving initially.  Patient has had some improvement with medications.  Patient's laboratory tests were notable for significant leukocytosis..Patient presented to the ED for evaluation  Electrolytes were notable for hyponatremia hypokalemia and AKI.  Patient was treated with IV fluids.  She was also given potassium replacement.  She was given medications for nausea and vomiting with improvement.  Patient noted to have a lactic acidosis.  She was started on antibiotics empirically.  No clear source for infection at this time.  Urinalysis does not suggest UTI.  She has CT scan of abdomen pelvis without acute abnormality.  Patient's not having any meningitis or headache suggestive of meningitis encephalitis.  Blood cultures have been sent off we will continue to monitor.    Patient is notably hypertensive here in the ER.  possible jer symptoms could be related to hypertensive emergency urgency/PRES.  Have started on IV labetalol .  Continue to monitor blood pressure closely. Will plan on admission to the hospital for further treatment  {Document critical care time when appropriate:1} {Document review of labs and clinical decision tools ie heart score, Chads2Vasc2 etc:1}  {Document your independent review of radiology images, and any outside records:1} {Document your discussion with family members, caretakers, and with consultants:1} {Document social determinants of  health affecting pt's care:1} {Document your decision making why or why not admission, treatments were needed:1} Final Clinical Impression(s) /  ED Diagnoses Final diagnoses:  Hypertensive urgency  Nausea and vomiting, unspecified vomiting type  Lactic acidosis  Hyponatremia  AKI (acute kidney injury) (HCC)    Rx / DC Orders ED Discharge Orders     None

## 2024-04-29 NOTE — ED Notes (Signed)
 UA completed with clean catch. I&O not needed at this time.

## 2024-04-30 DIAGNOSIS — E872 Acidosis, unspecified: Secondary | ICD-10-CM

## 2024-04-30 DIAGNOSIS — I160 Hypertensive urgency: Secondary | ICD-10-CM | POA: Diagnosis not present

## 2024-04-30 DIAGNOSIS — E871 Hypo-osmolality and hyponatremia: Secondary | ICD-10-CM | POA: Diagnosis not present

## 2024-04-30 DIAGNOSIS — N179 Acute kidney failure, unspecified: Secondary | ICD-10-CM | POA: Diagnosis not present

## 2024-04-30 LAB — LACTIC ACID, PLASMA: Lactic Acid, Venous: 1.4 mmol/L (ref 0.5–1.9)

## 2024-04-30 LAB — CBC
HCT: 32.8 % — ABNORMAL LOW (ref 36.0–46.0)
Hemoglobin: 11.4 g/dL — ABNORMAL LOW (ref 12.0–15.0)
MCH: 30.5 pg (ref 26.0–34.0)
MCHC: 34.8 g/dL (ref 30.0–36.0)
MCV: 87.7 fL (ref 80.0–100.0)
Platelets: 229 10*3/uL (ref 150–400)
RBC: 3.74 MIL/uL — ABNORMAL LOW (ref 3.87–5.11)
RDW: 13.6 % (ref 11.5–15.5)
WBC: 15.6 10*3/uL — ABNORMAL HIGH (ref 4.0–10.5)
nRBC: 0 % (ref 0.0–0.2)

## 2024-04-30 LAB — BASIC METABOLIC PANEL WITH GFR
Anion gap: 10 (ref 5–15)
BUN: 17 mg/dL (ref 8–23)
CO2: 23 mmol/L (ref 22–32)
Calcium: 8.5 mg/dL — ABNORMAL LOW (ref 8.9–10.3)
Chloride: 94 mmol/L — ABNORMAL LOW (ref 98–111)
Creatinine, Ser: 1.69 mg/dL — ABNORMAL HIGH (ref 0.44–1.00)
GFR, Estimated: 30 mL/min — ABNORMAL LOW (ref >60)
Glucose, Bld: 123 mg/dL — ABNORMAL HIGH (ref 70–99)
Potassium: 3 mmol/L — ABNORMAL LOW (ref 3.5–5.1)
Sodium: 127 mmol/L — ABNORMAL LOW (ref 135–145)

## 2024-04-30 MED ORDER — CLONIDINE HCL 0.1 MG PO TABS
0.1000 mg | ORAL_TABLET | Freq: Two times a day (BID) | ORAL | Status: DC
  Administered 2024-04-30 – 2024-05-02 (×7): 0.1 mg via ORAL
  Filled 2024-04-30 (×7): qty 1

## 2024-04-30 MED ORDER — SODIUM CHLORIDE 0.9 % IV SOLN: INTRAVENOUS | Status: DC

## 2024-04-30 MED ORDER — FLUOXETINE HCL 10 MG PO CAPS
10.0000 mg | ORAL_CAPSULE | Freq: Every day | ORAL | Status: DC
  Administered 2024-04-30 – 2024-05-02 (×3): 10 mg via ORAL
  Filled 2024-04-30 (×3): qty 1

## 2024-04-30 MED ORDER — POTASSIUM CHLORIDE CRYS ER 20 MEQ PO TBCR
40.0000 meq | EXTENDED_RELEASE_TABLET | Freq: Once | ORAL | Status: AC
  Administered 2024-04-30: 40 meq via ORAL
  Filled 2024-04-30: qty 2

## 2024-04-30 MED ORDER — LABETALOL HCL 5 MG/ML IV SOLN
10.0000 mg | INTRAVENOUS | Status: AC
  Administered 2024-04-30: 10 mg via INTRAVENOUS
  Filled 2024-04-30: qty 4

## 2024-04-30 MED ORDER — HYDRALAZINE HCL 50 MG PO TABS
50.0000 mg | ORAL_TABLET | Freq: Three times a day (TID) | ORAL | Status: DC
  Administered 2024-04-30 – 2024-05-02 (×10): 50 mg via ORAL
  Filled 2024-04-30 (×10): qty 1

## 2024-04-30 MED ORDER — POTASSIUM PHOSPHATES 15 MMOLE/5ML IV SOLN
30.0000 mmol | Freq: Once | INTRAVENOUS | Status: AC
  Administered 2024-04-30: 30 mmol via INTRAVENOUS
  Filled 2024-04-30: qty 10

## 2024-04-30 MED ORDER — LORAZEPAM 0.5 MG PO TABS
0.5000 mg | ORAL_TABLET | ORAL | Status: DC | PRN
  Administered 2024-04-30 – 2024-05-01 (×2): 0.5 mg via ORAL
  Filled 2024-04-30 (×2): qty 1

## 2024-04-30 MED ORDER — METOPROLOL TARTRATE 25 MG PO TABS
25.0000 mg | ORAL_TABLET | Freq: Two times a day (BID) | ORAL | Status: DC
  Administered 2024-04-30 – 2024-05-02 (×6): 25 mg via ORAL
  Filled 2024-04-30 (×6): qty 1

## 2024-04-30 MED ORDER — LEVOTHYROXINE SODIUM 50 MCG PO TABS
50.0000 ug | ORAL_TABLET | Freq: Every day | ORAL | Status: DC
  Administered 2024-05-01 – 2024-05-02 (×2): 50 ug via ORAL
  Filled 2024-04-30 (×2): qty 1

## 2024-04-30 MED ORDER — MAGNESIUM SULFATE 4 GM/100ML IV SOLN
4.0000 g | Freq: Once | INTRAVENOUS | Status: AC
  Administered 2024-04-30: 4 g via INTRAVENOUS
  Filled 2024-04-30: qty 100

## 2024-04-30 MED ORDER — AMLODIPINE BESYLATE 10 MG PO TABS
10.0000 mg | ORAL_TABLET | Freq: Every day | ORAL | Status: DC
  Administered 2024-04-30 – 2024-05-02 (×3): 10 mg via ORAL
  Filled 2024-04-30 (×3): qty 1

## 2024-04-30 MED ORDER — ASPIRIN 81 MG PO TBEC
81.0000 mg | DELAYED_RELEASE_TABLET | Freq: Every day | ORAL | Status: DC
  Administered 2024-04-30 – 2024-05-02 (×3): 81 mg via ORAL
  Filled 2024-04-30 (×3): qty 1

## 2024-04-30 MED ORDER — FERROUS SULFATE 325 (65 FE) MG PO TABS
325.0000 mg | ORAL_TABLET | Freq: Every day | ORAL | Status: DC
  Administered 2024-05-01 – 2024-05-02 (×2): 325 mg via ORAL
  Filled 2024-04-30 (×2): qty 1

## 2024-04-30 MED ORDER — ATORVASTATIN CALCIUM 80 MG PO TABS
80.0000 mg | ORAL_TABLET | Freq: Every day | ORAL | Status: DC
  Administered 2024-04-30 – 2024-05-02 (×3): 80 mg via ORAL
  Filled 2024-04-30 (×3): qty 1

## 2024-04-30 NOTE — Plan of Care (Signed)

## 2024-04-30 NOTE — Evaluation (Signed)
 Physical Therapy Evaluation Patient Details Name: Cindy Huerta MRN: [id number] DOB: 12-25-1945 Today's Date: 04/30/2024  History of Present Illness  Pt is an 78 y/o female presenting on 04/29/24 with nausea and vomiting. Found to have leukocytosis, AKI, hyponatremia and hypertensive urgency. Imaging negative for acute abnormalities. PMH: dementia, HTN, HFpEF, CKD3  Clinical Impression  Pt admitted with above diagnosis. Pt is from home with son (reports he works from home so is available).  She reports she likes to go to the [facility] and ambulatory with occasional cane use but per prior admission was using rollator at times.  Today, pt limited due to orthostatic hypotension.  She transferred at supervision level then ambulated 7' but became lightheaded and had to return 7' back to bed.  Pt did need UE support/RW for balance.  Suspect that pt will progress well as her intake increases and orthostatic BP improves.  Would recommend HHPT and 24 hr supervision initially.  Pt currently with functional limitations due to the deficits listed below (see PT Problem List). Pt will benefit from acute skilled PT to increase their independence and safety with mobility to allow discharge.           If plan is discharge home, recommend the following: A little help with walking and/or transfers;A little help with bathing/dressing/bathroom;Assistance with cooking/housework;Help with stairs or ramp for entrance   Can travel by private vehicle        Equipment Recommendations None recommended by PT  Recommendations for Other Services       Functional Status Assessment Patient has had a recent decline in their functional status and demonstrates the ability to make significant improvements in function in a reasonable and predictable amount of time.     Precautions / Restrictions Precautions Precautions: Fall;Other (comment) Precaution/Restrictions Comments: monitor orthostatics and HR      Mobility  Bed  Mobility Overal bed mobility: Needs Assistance Bed Mobility: Supine to Sit, Sit to Supine     Supine to sit: Supervision Sit to supine: Supervision   General bed mobility comments: increased time    Transfers Overall transfer level: Needs assistance Equipment used: None Transfers: Sit to/from Stand Sit to Stand: Contact guard assist           General transfer comment: Once up pt reaching for counter requiring min A/HHA for balance    Ambulation/Gait Ambulation/Gait assistance: Min assist, +2 safety/equipment Gait Distance (Feet): 14 Feet Assistive device: 1 person hand held assist, Rolling walker (2 wheels) Gait Pattern/deviations: Step-to pattern, Decreased stride length, Trunk flexed Gait velocity: decreased     General Gait Details: Pt reports no AD use at home, so started without AD but pt reaching for counter and HHA requiring min A so obtained RW to complete walk.  After about 7' forward pt had c/o dizzy/lightheaded and HR up to 139 bpm max.  She started backing quickly to bed with min A for balance and assist with lines  Stairs            Wheelchair Mobility     Tilt Bed    Modified Rankin (Stroke Patients Only)       Balance Overall balance assessment: Needs assistance Sitting-balance support: No upper extremity supported, Feet supported Sitting balance-Leahy Scale: Good     Standing balance support: Bilateral upper extremity supported, During functional activity, Reliant on assistive device for balance, Single extremity supported Standing balance-Leahy Scale: Poor Standing balance comment: reaching out for BUE support to IV pole, hands and sink. Provided  RW w/ improved stability                             Pertinent Vitals/Pain Pain Assessment Pain Assessment: No/denies pain    Home Living Family/patient expects to be discharged to:: Private residence Living Arrangements: Children Available Help at Discharge: Family;Available  24 hours/day (son works from home) Type of Home: House Home Access: Stairs to enter Entrance Stairs-Rails: None Secretary/administrator of Steps: 3 Alternate Level Stairs-Number of Steps: 9 Home Layout: Two level Home Equipment: Cane - quad;Shower seat;Rollator (4 wheels);Rolling Walker (2 wheels) Additional Comments: son works from home per pt    Prior Function Prior Level of Function : Independent/Modified Independent;History of Falls (last six months);Patient poor historian/Family not available             Mobility Comments: pt reports no AD typically needed for mobility. Per prior admission, pt with occasional use of rollator ADLs Comments: Mod I with ADL; son assists with IADL (per prior admission). Pt reports walking at the [facility]     Extremity/Trunk Assessment   Upper Extremity Assessment Upper Extremity Assessment: Defer to OT evaluation    Lower Extremity Assessment Lower Extremity Assessment: Overall WFL for tasks assessed    Cervical / Trunk Assessment Cervical / Trunk Assessment: Normal  Communication   Communication Communication: No apparent difficulties    Cognition Arousal: Alert Behavior During Therapy: Flat affect, Anxious, Impulsive   PT - Cognitive impairments: No family/caregiver present to determine baseline, History of cognitive impairments                       PT - Cognition Comments: Pt became anxious when dizzy/lightheaded needing cues for safety; She was oriented to self, place, and month (not able to state year) Following commands: Impaired Following commands impaired: Follows one step commands with increased time     Cueing Cueing Techniques: Verbal cues, Gestural cues, Tactile cues, Visual cues     General Comments General comments (skin integrity, edema, etc.): BP has been measured in R LE.  Pt's BP prior was 154/75 and HR rest 85 bpm.  With ambulation pt reports lightheaded and HR up to 139 bpm max (after just 7'), pt backed  and sat on bed.  Obtained BP and was 125/67 with leg in dependent position.  Returned to supine BP 161/67 and HR back to 85 bpm.  MD in as pt returning to bed -Suspect orthostatic hypotension due to lightheaded, elevated HR, recent N/V with low intake.  OT notified RN.    Exercises     Assessment/Plan    PT Assessment Patient needs continued PT services  PT Problem List Decreased strength;Cardiopulmonary status limiting activity;Decreased cognition;Decreased activity tolerance;Decreased knowledge of use of DME;Decreased balance;Decreased mobility;Decreased safety awareness       PT Treatment Interventions DME instruction;Therapeutic exercise;Gait training;Stair training;Functional mobility training;Therapeutic activities;Patient/family education;Modalities;Balance training    PT Goals (Current goals can be found in the Care Plan section)  Acute Rehab PT Goals Patient Stated Goal: return home PT Goal Formulation: With patient Time For Goal Achievement: 05/14/24 Potential to Achieve Goals: Good    Frequency Min 2X/week     Co-evaluation PT/OT/SLP Co-Evaluation/Treatment: Yes Reason for Co-Treatment: For patient/therapist safety;To address functional/ADL transfers PT goals addressed during session: Mobility/safety with mobility;Balance OT goals addressed during session: ADL's and self-care;Proper use of Adaptive equipment and DME       AM-PAC PT "6 Clicks" Mobility  Outcome Measure Help needed turning from your back to your side while in a flat bed without using bedrails?: A Little Help needed moving from lying on your back to sitting on the side of a flat bed without using bedrails?: A Little Help needed moving to and from a bed to a chair (including a wheelchair)?: A Little Help needed standing up from a chair using your arms (e.g., wheelchair or bedside chair)?: A Little Help needed to walk in hospital room?: A Little Help needed climbing 3-5 steps with a railing? : A Lot 6  Click Score: 17    End of Session Equipment Utilized During Treatment: Gait belt Activity Tolerance: Treatment limited secondary to medical complications (Comment) Patient left: in bed;with call bell/phone within reach;with bed alarm set Nurse Communication: Mobility status;Other (comment) (orthostatic) PT Visit Diagnosis: Other abnormalities of gait and mobility (R26.89);Muscle weakness (generalized) (M62.81)    Time: 1610-9604 PT Time Calculation (min) (ACUTE ONLY): 25 min   Charges:   PT Evaluation $PT Eval Moderate Complexity: 1 Mod   PT General Charges $$ ACUTE PT VISIT: 1 Visit         Cyd Dowse, PT Acute Rehab [facility] [phone]   Carolynn Citrin 04/30/2024, 1:25 PM

## 2024-04-30 NOTE — Progress Notes (Signed)
 Patient blood pressure is persistently high after giving total 30 mg of IV hydralazine  and IV labetalol  10 mg.  Resuming home blood pressure regimen include amlodipine , clonidine  and hydralazine .  Holding Toprol -XL given patient just received labetalol .   Holding hydrochlorothiazide-triamterene given patient is hyponatremic and have AKI. Continue IV hydralazine  as needed.   Amina Menchaca, MD [facility] 04/30/2024, 1:02 AM

## 2024-04-30 NOTE — Evaluation (Signed)
 Occupational Therapy Evaluation Patient Details Name: Cindy Huerta MRN: [id number] DOB: Aug 14, 1946 Today's Date: 04/30/2024   History of Present Illness   Pt is an 78 y/o female presenting with nausea and vomiting. Found to have leukocytosis, AKI, hyponatremia and hypertensive urgency. Imaging negative for acute abnormalities. PMH: dementia, HTN, HFpEF, CKD3     Clinical Impressions PTA, pt lives with son who pt reports works from home. Pt reports typically Modified Independent with ADLs and does not normally use AD for mobility. Per prior admission, pt with intermittent rollator use. Pt presents now with deficits in endurance, dynamic standing balance and cognition. Pt able to stand without assist but noted w/ poor balance w/o AD, requiring Min A x 2. Trialed using RW with improved stability noted but with sudden onset of dizziness requiring return to bed. Overall, pt requires Min A for ADLs. Anticipate good progress as dizziness resolves in order to return home at DC.  BP pre activity: 154/75 BP after dizziness onset and return to bed: 125/67, 139bpm BP after supine > 5 min: 161/67, 85bpm     If plan is discharge home, recommend the following:   A little help with bathing/dressing/bathroom;A little help with walking and/or transfers;Direct supervision/assist for medications management;Direct supervision/assist for financial management;Supervision due to cognitive status     Functional Status Assessment   Patient has had a recent decline in their functional status and demonstrates the ability to make significant improvements in function in a reasonable and predictable amount of time.     Equipment Recommendations   None recommended by OT     Recommendations for Other Services         Precautions/Restrictions   Precautions Precautions: Fall;Other (comment) Precaution/Restrictions Comments: monitor orthostatics and HR Restrictions Weight Bearing Restrictions Per  Provider Order: No     Mobility Bed Mobility Overal bed mobility: Needs Assistance Bed Mobility: Supine to Sit, Sit to Supine     Supine to sit: Supervision Sit to supine: Supervision        Transfers Overall transfer level: Needs assistance Equipment used: None Transfers: Sit to/from Stand Sit to Stand: Contact guard assist                  Balance Overall balance assessment: Needs assistance Sitting-balance support: No upper extremity supported, Feet supported Sitting balance-Leahy Scale: Fair     Standing balance support: Bilateral upper extremity supported, During functional activity, Reliant on assistive device for balance, No upper extremity supported Standing balance-Leahy Scale: Poor Standing balance comment: reaching out for BUE support to IV pole, hands and sink. Provided RW w/ improved stability                           ADL either performed or assessed with clinical judgement   ADL Overall ADL's : Needs assistance/impaired Eating/Feeding: Set up;Sitting   Grooming: Set up;Sitting   Upper Body Bathing: Sitting;Supervision/ safety   Lower Body Bathing: Minimal assistance;Sit to/from stand;Sitting/lateral leans   Upper Body Dressing : Supervision/safety;Sitting   Lower Body Dressing: Minimal assistance;Sit to/from stand;Sitting/lateral leans   Toilet Transfer: Minimal assistance;Stand-pivot;BSC/3in1   Toileting- Clothing Manipulation and Hygiene: Minimal assistance;Sitting/lateral lean;Sit to/from stand       Functional mobility during ADLs: Minimal assistance;+2 for safety/equipment General ADL Comments: Limited by reported dizziness, suspected orthostatic hypotension noted by increasing HR and delayed BP reading (though lower than previous BPs today)     Vision Ability to See in Adequate Light: 0  Adequate Patient Visual Report: No change from baseline Vision Assessment?: No apparent visual deficits     Perception          Praxis         Pertinent Vitals/Pain Pain Assessment Pain Assessment: No/denies pain     Extremity/Trunk Assessment Upper Extremity Assessment Upper Extremity Assessment: Overall WFL for tasks assessed;Right hand dominant   Lower Extremity Assessment Lower Extremity Assessment: Defer to PT evaluation   Cervical / Trunk Assessment Cervical / Trunk Assessment: Normal   Communication Communication Communication: No apparent difficulties   Cognition Arousal: Alert Behavior During Therapy: Flat affect, Anxious, Impulsive Cognition: History of cognitive impairments             OT - Cognition Comments: hx of dementia, flat affect but responds to humor. impulsive with mobility at times, requiring cues for safety and pacing. anxious behaviors when pt reporting dizziness with activity and quickly backing up to bedside                 Following commands: Impaired Following commands impaired: Follows one step commands with increased time     Cueing  General Comments   Cueing Techniques: Verbal cues;Gestural cues;Tactile cues;Visual cues      Exercises     Shoulder Instructions      Home Living Family/patient expects to be discharged to:: Private residence Living Arrangements: Children Available Help at Discharge: Family;Available 24 hours/day Type of Home: House Home Access: Stairs to enter [employer] of Steps: 3 Entrance Stairs-Rails: None Home Layout: Two level Alternate Level Stairs-Number of Steps: 9 Alternate Level Stairs-Rails: Right;Left;Can reach both Bathroom Shower/Tub: Producer, television/film/video: Standard     Home Equipment: Cane - quad;Shower seat;Rollator (4 wheels);Rolling Walker (2 wheels)   Additional Comments: son works from home per pt      Prior Functioning/Environment Prior Level of Function : Independent/Modified Independent;History of Falls (last six months);Patient poor historian/Family not available              Mobility Comments: pt reports no AD typically needed for mobility. Per prior admission, pt with occasional use of rollator ADLs Comments: Mod I with ADL; son assists with IADL (per prior admission). Pt reports walking at the [facility]    OT Problem List: Decreased activity tolerance;Impaired balance (sitting and/or standing);Decreased cognition;Decreased safety awareness;Decreased knowledge of use of DME or AE   OT Treatment/Interventions: Self-care/ADL training;Therapeutic exercise;Energy conservation;DME and/or AE instruction;Therapeutic activities;Patient/family education;Balance training      OT Goals(Current goals can be found in the care plan section)   Acute Rehab OT Goals Patient Stated Goal: have some ginger ale, not feel so dizzy OT Goal Formulation: With patient Time For Goal Achievement: 05/14/24 Potential to Achieve Goals: Good ADL Goals Pt Will Perform Lower Body Bathing: with supervision;sit to/from stand;sitting/lateral leans Pt Will Perform Lower Body Dressing: with supervision;sitting/lateral leans;sit to/from stand Pt Will Transfer to Toilet: with supervision;ambulating   OT Frequency:  Min 2X/week    Co-evaluation PT/OT/SLP Co-Evaluation/Treatment: Yes Reason for Co-Treatment: For patient/therapist safety;To address functional/ADL transfers   OT goals addressed during session: ADL's and self-care;Proper use of Adaptive equipment and DME      AM-PAC OT "6 Clicks" Daily Activity     Outcome Measure Help from another person eating meals?: A Little Help from another person taking care of personal grooming?: A Little Help from another person toileting, which includes using toliet, bedpan, or urinal?: A Little Help from another person bathing (including washing, rinsing, drying)?:  A Little Help from another person to put on and taking off regular upper body clothing?: A Little Help from another person to put on and taking off regular lower body clothing?: A  Little 6 Click Score: 18   End of Session Equipment Utilized During Treatment: Gait belt;Rolling walker (2 wheels) Nurse Communication: Mobility status  Activity Tolerance: Treatment limited secondary to medical complications (Comment) Patient left: in bed;with call bell/phone within reach;with bed alarm set  OT Visit Diagnosis: Unsteadiness on feet (R26.81);Other abnormalities of gait and mobility (R26.89);Muscle weakness (generalized) (M62.81)                Time: 3664-4034 OT Time Calculation (min): 25 min Charges:  OT General Charges $OT Visit: 1 Visit OT Evaluation $OT Eval Moderate Complexity: 1 Mod  Lawrence Pretty, OTR/L Acute Rehab Services Office: [phone]   Shireen Dory 04/30/2024, 12:45 PM

## 2024-04-30 NOTE — Progress Notes (Signed)
 PROGRESS NOTE        PATIENT DETAILS Name: Cindy Huerta Age: 78 y.o. Sex: female Date of Birth: 12-Jul-1946 Admit Date: 04/29/2024 Admitting Physician Elester Grim, MD ZOX:WRUE, Dann Dust, MD  Brief Summary: Patient is a 78 y.o.  female with history of dementia, HTN, HFpEF, CKD stage IIIa who was brought to the ED on 5/25 for-nausea/vomiting/dry heaving/lethargy-she was found to have leukocytosis/hypertensive urgency/AKI and hyponatremia.  Significant events: 5/25>> admit to [facility]  Significant studies: 5/25>> right shoulder x-ray: No acute findings 5/25>> CXR: No acute process. 5/25>> CT head: No acute abnormality. 5/25>> CT C-spine: No acute abnormality. 5/25>> CT abdomen/pelvis: No acute findings in the abdomen/pelvis.  Significant microbiology data: 5/25>> blood culture: No growth  Procedures: None  Consults: None  Subjective: Confused-discussed with overnight RN-last episode of vomiting was before midnight.   Objective: Vitals: Blood pressure 137/78, pulse 91, temperature 98.5 F (36.9 C), temperature source Oral, resp. rate 18, SpO2 98%.   Exam: Gen Exam:Alert awake-not in any distress HEENT:atraumatic, normocephalic Chest: B/L clear to auscultation anteriorly CVS:S1S2 regular Abdomen:soft non tender, non distended Extremities:no edema Neurology: Non focal Skin: no rash  Pertinent Labs/Radiology:    Latest Ref Rng & Units 04/30/2024    4:11 AM 04/29/2024    1:06 PM 04/29/2024    1:05 PM  CBC  WBC 4.0 - 10.5 K/uL 15.6     Hemoglobin 12.0 - 15.0 g/dL 45.4  09.8  11.9   Hematocrit 36.0 - 46.0 % 32.8  41.0  40.0   Platelets 150 - 400 K/uL 229       Lab Results  Component Value Date   NA 127 (L) 04/30/2024   K 3.0 (L) 04/30/2024   CL 94 (L) 04/30/2024   CO2 23 04/30/2024     Assessment/Plan: Intractable nausea/vomiting Unclear etiology-perhaps a viral syndrome CT abdomen without any SBO or any other underlying  pathology Continue supportive care-IVF/antiemetics Start clear liquids.  Acute metabolic encephalopathy superimposed on dementia Probably secondary to metabolic derangements Neuroimaging negative Supportive care Delirium precautions.  Hyponatremia Secondary to vomiting/HCTZ use Improving with IVF  Hypokalemia/hypomagnesemia/hypophosphatemia Secondary to GI loss Replete/recheck  AKI on CKD stage IIIa Secondary to vomiting along with HCTZ/triamterene/HCTZ use. Continue gentle hydration Frequent bladder scans to ensure no retention Avoid nephrotoxic agents Trend electrolytes  Hypertensive urgency BP better controlled today Amlodipine /clonidine /hydralazine -have been resumed overnight Metoprolol /Maxide/Micardis  remains on hold-will resume over the next several days.  Minimally elevated troponin No anginal symptoms-of unclear significance-possibly false positive elevation in the setting of AKI.  Chronic HFpEF Euvolemic Watch closely as patient on IV fluids.  Hypothyroidism Synthroid  TSH minimally elevated-repeat TSH in 4 to 6 weeks once acute illness is over.  History of TIA Aspirin /statin  Depression Prozac   Dementia Delirium precautions Pleasantly confused this morning  BMI: Estimated body mass index is 24.73 kg/m as calculated from the following:   Height as of 09/13/23: 5\' 6"  (1.676 m).   Weight as of 09/13/23: 69.5 kg.   Code status:   Code Status: Full Code   DVT Prophylaxis: heparin  injection 5,000 Units Start: 04/29/24 1700 SCDs Start: 04/29/24 1650   Family Communication: None at bedside   Disposition Plan: Status is: Inpatient Remains inpatient appropriate because: Severity of illness   Planned Discharge Destination:Home health   Diet: Diet Order  Diet NPO time specified  Diet effective now                     Antimicrobial agents: Anti-infectives (From admission, onward)    Start     Dose/Rate Route Frequency  Ordered Stop   04/29/24 1330  cefTRIAXone  (ROCEPHIN ) 1 g in sodium chloride  0.9 % 100 mL IVPB        1 g 200 mL/hr over 30 Minutes Intravenous  Once 04/29/24 1324 04/29/24 1451        MEDICATIONS: Scheduled Meds:  amLODipine   10 mg Oral Daily   cloNIDine   0.1 mg Oral BID   heparin   5,000 Units Subcutaneous Q8H   hydrALAZINE   50 mg Oral TID   Continuous Infusions:  sodium chloride  100 mL/hr at 04/29/24 1806   magnesium  sulfate bolus IVPB 4 g (04/30/24 0820)   potassium PHOSPHATE  IVPB (in mmol)     PRN Meds:.acetaminophen  **OR** acetaminophen , butalbital-acetaminophen -caffeine, hydrALAZINE , ondansetron  **OR** ondansetron  (ZOFRAN ) IV, prochlorperazine   I have personally reviewed following labs and imaging studies  LABORATORY DATA: CBC: Recent Labs  Lab 04/29/24 1300 04/29/24 1305 04/29/24 1306 04/30/24 0411  WBC 19.3*  --   --  15.6*  NEUTROABS 17.4*  --   --   --   HGB 13.4 13.6 13.9 11.4*  HCT 37.4 40.0 41.0 32.8*  MCV 85.8  --   --  87.7  PLT 293  --   --  229    Basic Metabolic Panel: Recent Labs  Lab 04/29/24 1300 04/29/24 1305 04/29/24 1306 04/29/24 2022 04/30/24 0411  NA 125* 124* 124*  --  127*  K 2.9* 2.9* 3.1*  --  3.0*  CL 85*  --  86*  --  94*  CO2 24  --   --   --  23  GLUCOSE 159*  --  159*  --  123*  BUN 14  --  15  --  17  CREATININE 1.42*  --  1.20*  --  1.69*  CALCIUM  9.7  --   --   --  8.5*  MG  --   --   --  1.1*  --   PHOS  --   --   --  1.9*  --     GFR: CrCl cannot be calculated (Unknown ideal weight.).  Liver Function Tests: Recent Labs  Lab 04/29/24 1300  AST 49*  ALT 23  ALKPHOS 67  BILITOT 0.8  PROT 8.4*  ALBUMIN 4.2   Recent Labs  Lab 04/29/24 1300  LIPASE 22   Recent Labs  Lab 04/29/24 1300  AMMONIA 15    Coagulation Profile: No results for input(s): "INR", "PROTIME" in the last 168 hours.  Cardiac Enzymes: No results for input(s): "CKTOTAL", "CKMB", "CKMBINDEX", "TROPONINI" in the last 168  hours.  BNP (last 3 results) No results for input(s): "PROBNP" in the last 8760 hours.  Lipid Profile: No results for input(s): "CHOL", "HDL", "LDLCALC", "TRIG", "CHOLHDL", "LDLDIRECT" in the last 72 hours.  Thyroid  Function Tests: Recent Labs    04/29/24 1558  TSH 6.045*    Anemia Panel: No results for input(s): "VITAMINB12", "FOLATE", "FERRITIN", "TIBC", "IRON ", "RETICCTPCT" in the last 72 hours.  Urine analysis:    Component Value Date/Time   COLORURINE YELLOW 04/29/2024 1334   APPEARANCEUR CLEAR 04/29/2024 1334   LABSPEC 1.013 04/29/2024 1334   PHURINE 7.0 04/29/2024 1334   GLUCOSEU NEGATIVE 04/29/2024 1334   HGBUR NEGATIVE 04/29/2024 1334   BILIRUBINUR  NEGATIVE 04/29/2024 1334   KETONESUR 5 (A) 04/29/2024 1334   PROTEINUR 100 (A) 04/29/2024 1334   NITRITE NEGATIVE 04/29/2024 1334   LEUKOCYTESUR NEGATIVE 04/29/2024 1334    Sepsis Labs: Lactic Acid, Venous    Component Value Date/Time   LATICACIDVEN 1.4 04/30/2024 0411    MICROBIOLOGY: Recent Results (from the past 240 hours)  Blood culture (routine x 2)     Status: None (Preliminary result)   Collection Time: 04/29/24  1:24 PM   Specimen: BLOOD  Result Value Ref Range Status   Specimen Description BLOOD SITE NOT SPECIFIED  Final   Special Requests   Final    BOTTLES DRAWN AEROBIC AND ANAEROBIC Blood Culture adequate volume   Culture   Final    NO GROWTH < 24 HOURS Performed at [facility], [address], [place], [place] [address]    Report Status PENDING  Incomplete  Blood culture (routine x 2)     Status: None (Preliminary result)   Collection Time: 04/29/24  1:29 PM   Specimen: BLOOD  Result Value Ref Range Status   Specimen Description BLOOD SITE NOT SPECIFIED  Final   Special Requests   Final    AEROBIC BOTTLE ONLY Blood Culture results may not be optimal due to an inadequate volume of blood received in culture bottles   Culture   Final    NO GROWTH < 24 HOURS Performed at [facility], [address], [place], [place] [address]    Report Status PENDING  Incomplete    RADIOLOGY STUDIES/RESULTS: CT ABDOMEN PELVIS WO CONTRAST Result Date: 04/29/2024 CLINICAL DATA:  Acute abdominal pain. EXAM: CT ABDOMEN AND PELVIS WITHOUT CONTRAST TECHNIQUE: Multidetector CT imaging of the abdomen and pelvis was performed following the standard protocol without IV contrast. RADIATION DOSE REDUCTION: This exam was performed according to the departmental dose-optimization program which includes automated exposure control, adjustment of the mA and/or kV according to patient size and/or use of iterative reconstruction technique. COMPARISON:  09/12/2023 FINDINGS: Lower chest: Heart size is normal. Three-vessel atherosclerotic coronary artery disease. Calcified plaque over the descending thoracic aorta. Lung bases are clear. Hepatobiliary: Prior cholecystectomy. Liver and biliary tree are normal. Pancreas: Mild fatty replacement of the pancreas which is otherwise normal. Spleen: Normal. Adrenals/Urinary Tract: Adrenal glands are normal. Kidneys are normal in size without hydronephrosis or nephrolithiasis. Very minimal symmetric stranding of the perinephric fat. Ureters and bladder are normal. Stomach/Bowel: Stomach and small bowel are normal. Appendix is not definitely visualized. There is diverticulosis of the colon most prominent over the descending colon. No evidence of diverticulitis. Vascular/Lymphatic: Mild calcified plaque over the abdominal aorta which is normal in caliber. No adenopathy. Reproductive: Status post hysterectomy. No adnexal masses. Other: No evidence of focal inflammatory change. Musculoskeletal: No acute findings. Mild degenerative change of the spine and hips. IMPRESSION: 1. No acute findings in the abdomen/pelvis. 2. Colonic diverticulosis without evidence of diverticulitis. 3. Aortic atherosclerosis. Three-vessel atherosclerotic coronary artery disease. Aortic Atherosclerosis  (ICD10-I70.0). Electronically Signed   By: Roda Cirri M.D.   On: 04/29/2024 15:36   CT Cervical Spine Wo Contrast Result Date: 04/29/2024 EXAM: CT CERVICAL SPINE WITHOUT CONTRAST 04/29/2024 03:01:22 PM TECHNIQUE: CT of the cervical was performed without the administration of intravenous contrast. Multiplanar reformatted images are provided for review. Automated exposure control, iterative reconstruction, and/or weight based adjustment of the mA/kV was utilized to reduce the radiation dose to as low as reasonably achievable. COMPARISON: 08/17/2022 CLINICAL HISTORY: Neck trauma (Age >=  65y). Nausea and vomiting since last night. FINDINGS: CERVICAL SPINE: BONES/ALIGNMENT: There is no acute fracture or traumatic malalignment. DEGENERATIVE CHANGES: Multilevel degenerative changes of the cervical spine are similar to the prior study. SOFT TISSUES: There is no prevertebral soft tissue swelling. IMPRESSION: 1. No acute abnormality of the cervical spine related to the reported neck trauma. 2. Multilevel degenerative changes in the cervical spine, similar to the prior study. Electronically signed by: Audree Leas MD 04/29/2024 03:33 PM EDT RP Workstation: [id number]   CT HEAD WO CONTRAST Result Date: 04/29/2024 EXAM: CT HEAD WITHOUT 04/29/2024 03:01:22 PM TECHNIQUE: CT of the head was performed without the administration of intravenous contrast. Automated exposure control, iterative reconstruction, and/or weight based adjustment of the mA/kV was utilized to reduce the radiation dose to as low as reasonably achievable. COMPARISON: CT head without contrast 09/12/2023 CLINICAL HISTORY: Mental status change, unknown cause. Nausea and vomiting since last night. FINDINGS: BRAIN AND VENTRICLES: There is no acute intracranial hemorrhage, mass effect or midline shift. No abnormal extra-axial fluid collection. The gray-white differentiation is maintained without evidence of an acute infarct. There is no evidence of  hydrocephalus. Mild atrophy and moderate diffuse white matter changes are similar to the prior exam. ORBITS: The visualized portion of the orbits demonstrate no acute abnormality. SINUSES: The visualized paranasal sinuses and mastoid air cells demonstrate no acute abnormality. SOFT TISSUES AND SKULL: No acute abnormality of the visualized skull or soft tissues. Atherosclerotic calcifications are present in the cavernous carotid arteries bilaterally. No hyperdense vessel is present. IMPRESSION: 1. No acute intracranial abnormality. 2. Mild atrophy and moderate diffuse white matter changes, similar to the prior exam. 3. Atherosclerotic calcifications in the cavernous carotid arteries bilaterally. No hyperdense vessel. Electronically signed by: Audree Leas MD 04/29/2024 03:31 PM EDT RP Workstation: [id number]   DG Chest Portable 1 View Result Date: 04/29/2024 EXAM: 1 VIEW XRAY OF THE CHEST 04/29/2024 01:23:31 PM COMPARISON: 11/16/2022 CLINICAL HISTORY: Fall, pain. Altered mental status, nausea and vomiting. Hypertension. FINDINGS: LUNGS AND PLEURA: Lung volumes are low. No focal pulmonary opacity. No pulmonary edema. No pleural effusion. No pneumothorax. HEART AND MEDIASTINUM: No acute abnormality of the cardiac and mediastinal silhouettes. BONES AND SOFT TISSUES: No acute osseous abnormality. IMPRESSION: 1. No acute process. 2. Low lung volumes. Electronically signed by: Audree Leas MD 04/29/2024 01:51 PM EDT RP Workstation: [id number]   DG Shoulder Right Portable Result Date: 04/29/2024 CLINICAL DATA:  Fall with right shoulder pain. EXAM: RIGHT SHOULDER - 1 VIEW COMPARISON:  None Available. FINDINGS: Minimal degenerate change of the [facility] joint. Minimal degenerative change of the glenohumeral joint. No acute fracture or dislocation. Remainder the exam is unremarkable. IMPRESSION: 1. No acute findings. 2. Minimal degenerative changes. Electronically Signed   By: Roda Cirri M.D.   On: 04/29/2024  13:50     LOS: 1 day   Kimberly Penna, MD  [facility]    To contact the attending provider between 7A-7P or the covering provider during after hours 7P-7A, please log into the web site [email or web address] and access using universal [place] password for that web site. If you do not have the password, please call the hospital operator.  04/30/2024, 8:47 AM

## 2024-05-01 ENCOUNTER — Encounter (HOSPITAL_COMMUNITY): Payer: Self-pay | Admitting: Internal Medicine

## 2024-05-01 DIAGNOSIS — E871 Hypo-osmolality and hyponatremia: Secondary | ICD-10-CM | POA: Diagnosis not present

## 2024-05-01 DIAGNOSIS — I160 Hypertensive urgency: Secondary | ICD-10-CM | POA: Diagnosis not present

## 2024-05-01 DIAGNOSIS — N179 Acute kidney failure, unspecified: Secondary | ICD-10-CM | POA: Diagnosis not present

## 2024-05-01 DIAGNOSIS — E872 Acidosis, unspecified: Secondary | ICD-10-CM | POA: Diagnosis not present

## 2024-05-01 LAB — CBC
HCT: 31.1 % — ABNORMAL LOW (ref 36.0–46.0)
Hemoglobin: 10.2 g/dL — ABNORMAL LOW (ref 12.0–15.0)
MCH: 30.4 pg (ref 26.0–34.0)
MCHC: 32.8 g/dL (ref 30.0–36.0)
MCV: 92.6 fL (ref 80.0–100.0)
Platelets: 182 10*3/uL (ref 150–400)
RBC: 3.36 MIL/uL — ABNORMAL LOW (ref 3.87–5.11)
RDW: 14 % (ref 11.5–15.5)
WBC: 11.4 10*3/uL — ABNORMAL HIGH (ref 4.0–10.5)
nRBC: 0 % (ref 0.0–0.2)

## 2024-05-01 LAB — BASIC METABOLIC PANEL WITH GFR
Anion gap: 7 (ref 5–15)
BUN: 15 mg/dL (ref 8–23)
CO2: 24 mmol/L (ref 22–32)
Calcium: 8.3 mg/dL — ABNORMAL LOW (ref 8.9–10.3)
Chloride: 100 mmol/L (ref 98–111)
Creatinine, Ser: 1.48 mg/dL — ABNORMAL HIGH (ref 0.44–1.00)
GFR, Estimated: 36 mL/min — ABNORMAL LOW (ref >60)
Glucose, Bld: 90 mg/dL (ref 70–99)
Potassium: 3.8 mmol/L (ref 3.5–5.1)
Sodium: 131 mmol/L — ABNORMAL LOW (ref 135–145)

## 2024-05-01 LAB — PHOSPHORUS: Phosphorus: 3.6 mg/dL (ref 2.5–4.6)

## 2024-05-01 LAB — MAGNESIUM: Magnesium: 2.3 mg/dL (ref 1.7–2.4)

## 2024-05-01 MED ORDER — TRAZODONE HCL 50 MG PO TABS
50.0000 mg | ORAL_TABLET | Freq: Every evening | ORAL | Status: DC | PRN
  Administered 2024-05-01 (×2): 50 mg via ORAL
  Filled 2024-05-01 (×2): qty 1

## 2024-05-01 NOTE — Plan of Care (Signed)

## 2024-05-01 NOTE — Progress Notes (Addendum)
 PROGRESS NOTE        PATIENT DETAILS Name: Cindy Huerta Age: 78 y.o. Sex: female Date of Birth: [birth year] Admit Date: 04/29/2024 Admitting Physician Elester Grim, MD ZOX:WRUE, Dann Dust, MD  Brief Summary: Patient is a 78 y.o.  female with history of dementia, HTN, HFpEF, CKD stage IIIa who was brought to the ED on 5/25 for-nausea/vomiting/dry heaving/lethargy-she was found to have leukocytosis/hypertensive urgency/AKI and hyponatremia.  Significant events: 5/25>> admit to [facility]  Significant studies: 5/25>> right shoulder x-ray: No acute findings 5/25>> CXR: No acute process. 5/25>> CT head: No acute abnormality. 5/25>> CT C-spine: No acute abnormality. 5/25>> CT abdomen/pelvis: No acute findings in the abdomen/pelvis.  Significant microbiology data: 5/25>> blood culture: No growth  Procedures: None  Consults: None  Subjective: No nausea vomiting-lying comfortably in bed.  Objective: Vitals: Blood pressure (!) 139/48, pulse 65, temperature 97.7 F (36.5 C), temperature source Oral, resp. rate 20, SpO2 98%.   Exam: Awake/alert Chest: Clear to auscultation CVS: S1-S2 regular Abdomen: Soft nontender nondistended Extremity: No edema  Pertinent Labs/Radiology:    Latest Ref Rng & Units 05/01/2024    3:55 AM 04/30/2024    4:11 AM 04/29/2024    1:06 PM  CBC  WBC 4.0 - 10.5 K/uL 11.4  15.6    Hemoglobin 12.0 - 15.0 g/dL 45.4  09.8  11.9   Hematocrit 36.0 - 46.0 % 31.1  32.8  41.0   Platelets 150 - 400 K/uL 182  229      Lab Results  Component Value Date   NA 131 (L) 05/01/2024   K 3.8 05/01/2024   CL 100 05/01/2024   CO2 24 05/01/2024     Assessment/Plan: Intractable nausea/vomiting Unclear etiology-perhaps a viral syndrome CT abdomen without any SBO or any other underlying pathology No further vomiting Tolerating clears-advance diet.  Acute metabolic encephalopathy superimposed on dementia Probably secondary to metabolic  derangements Neuroimaging negative Supportive care Delirium precautions.  Hyponatremia Secondary to vomiting/HCTZ use Much better-advance diet-stopping IVF.  Hypokalemia/hypomagnesemia/hypophosphatemia Secondary to GI loss Replete.  AKI on CKD stage IIIa Secondary to vomiting along with HCTZ/triamterene/HCTZ use. Better with hydration-resolution of nausea/vomiting. Frequent bladder scans to ensure no retention Avoid nephrotoxic agents Trend electrolytes periodically.  HTN BP controlled Continue amlodipine /clonidine /hydralazine /metoprolol  Maxide/Micardis  remains on hold-will resume over the next several days.  Minimally elevated troponin No anginal symptoms-of unclear significance-possibly false positive elevation in the setting of AKI.  Chronic HFpEF Euvolemic  Hypothyroidism Synthroid  TSH minimally elevated-repeat TSH in 4 to 6 weeks once acute illness is over.  History of TIA Aspirin /statin  Depression Prozac   Dementia Delirium precautions Pleasantly confused this morning  BMI: Estimated body mass index is 24.73 kg/m as calculated from the following:   Height as of 09/13/23: 5\' 6"  (1.676 m).   Weight as of 09/13/23: 69.5 kg.   Code status:   Code Status: Full Code   DVT Prophylaxis: heparin  injection 5,000 Units Start: 04/29/24 1700 SCDs Start: 04/29/24 1650   Family Communication:Son-[name] [phone] -updated 5/26   Disposition Plan: Status is: Inpatient Remains inpatient appropriate because: Severity of illness   Planned Discharge Destination:Home health   Diet: Diet Order             Diet clear liquid Fluid consistency: Thin  Diet effective now  Antimicrobial agents: Anti-infectives (From admission, onward)    Start     Dose/Rate Route Frequency Ordered Stop   04/29/24 1330  cefTRIAXone  (ROCEPHIN ) 1 g in sodium chloride  0.9 % 100 mL IVPB        1 g 200 mL/hr over 30 Minutes Intravenous  Once 04/29/24 1324  04/29/24 1451        MEDICATIONS: Scheduled Meds:  amLODipine   10 mg Oral Daily   aspirin  EC  81 mg Oral Daily   atorvastatin   80 mg Oral Daily   cloNIDine   0.1 mg Oral BID   ferrous sulfate   325 mg Oral Q breakfast   FLUoxetine   10 mg Oral Daily   heparin   5,000 Units Subcutaneous Q8H   hydrALAZINE   50 mg Oral TID   levothyroxine   50 mcg Oral QAC breakfast   metoprolol  tartrate  25 mg Oral BID   Continuous Infusions:  sodium chloride  75 mL/hr at 05/01/24 0335   PRN Meds:.acetaminophen  **OR** acetaminophen , butalbital-acetaminophen -caffeine, hydrALAZINE , LORazepam , ondansetron  **OR** ondansetron  (ZOFRAN ) IV, prochlorperazine, traZODone    I have personally reviewed following labs and imaging studies  LABORATORY DATA: CBC: Recent Labs  Lab 04/29/24 1300 04/29/24 1305 04/29/24 1306 04/30/24 0411 05/01/24 0355  WBC 19.3*  --   --  15.6* 11.4*  NEUTROABS 17.4*  --   --   --   --   HGB 13.4 13.6 13.9 11.4* 10.2*  HCT 37.4 40.0 41.0 32.8* 31.1*  MCV 85.8  --   --  87.7 92.6  PLT 293  --   --  229 182    Basic Metabolic Panel: Recent Labs  Lab 04/29/24 1300 04/29/24 1305 04/29/24 1306 04/29/24 2022 04/30/24 0411 05/01/24 0355  NA 125* 124* 124*  --  127* 131*  K 2.9* 2.9* 3.1*  --  3.0* 3.8  CL 85*  --  86*  --  94* 100  CO2 24  --   --   --  23 24  GLUCOSE 159*  --  159*  --  123* 90  BUN 14  --  15  --  17 15  CREATININE 1.42*  --  1.20*  --  1.69* 1.48*  CALCIUM  9.7  --   --   --  8.5* 8.3*  MG  --   --   --  1.1*  --  2.3  PHOS  --   --   --  1.9*  --  3.6    GFR: CrCl cannot be calculated (Unknown ideal weight.).  Liver Function Tests: Recent Labs  Lab 04/29/24 1300  AST 49*  ALT 23  ALKPHOS 67  BILITOT 0.8  PROT 8.4*  ALBUMIN 4.2   Recent Labs  Lab 04/29/24 1300  LIPASE 22   Recent Labs  Lab 04/29/24 1300  AMMONIA 15    Coagulation Profile: No results for input(s): "INR", "PROTIME" in the last 168 hours.  Cardiac Enzymes: No  results for input(s): "CKTOTAL", "CKMB", "CKMBINDEX", "TROPONINI" in the last 168 hours.  BNP (last 3 results) No results for input(s): "PROBNP" in the last 8760 hours.  Lipid Profile: No results for input(s): "CHOL", "HDL", "LDLCALC", "TRIG", "CHOLHDL", "LDLDIRECT" in the last 72 hours.  Thyroid  Function Tests: Recent Labs    04/29/24 1558  TSH 6.045*    Anemia Panel: No results for input(s): "VITAMINB12", "FOLATE", "FERRITIN", "TIBC", "IRON ", "RETICCTPCT" in the last 72 hours.  Urine analysis:    Component Value Date/Time   COLORURINE YELLOW 04/29/2024 1334   APPEARANCEUR  CLEAR 04/29/2024 1334   LABSPEC 1.013 04/29/2024 1334   PHURINE 7.0 04/29/2024 1334   GLUCOSEU NEGATIVE 04/29/2024 1334   HGBUR NEGATIVE 04/29/2024 1334   BILIRUBINUR NEGATIVE 04/29/2024 1334   KETONESUR 5 (A) 04/29/2024 1334   PROTEINUR 100 (A) 04/29/2024 1334   NITRITE NEGATIVE 04/29/2024 1334   LEUKOCYTESUR NEGATIVE 04/29/2024 1334    Sepsis Labs: Lactic Acid, Venous    Component Value Date/Time   LATICACIDVEN 1.4 04/30/2024 0411    MICROBIOLOGY: Recent Results (from the past 240 hours)  Blood culture (routine x 2)     Status: None (Preliminary result)   Collection Time: 04/29/24  1:24 PM   Specimen: BLOOD  Result Value Ref Range Status   Specimen Description BLOOD SITE NOT SPECIFIED  Final   Special Requests   Final    BOTTLES DRAWN AEROBIC AND ANAEROBIC Blood Culture adequate volume   Culture   Final    NO GROWTH 2 DAYS Performed at [facility], [address], [place], [place] [address]    Report Status PENDING  Incomplete  Blood culture (routine x 2)     Status: None (Preliminary result)   Collection Time: 04/29/24  1:29 PM   Specimen: BLOOD  Result Value Ref Range Status   Specimen Description BLOOD SITE NOT SPECIFIED  Final   Special Requests   Final    AEROBIC BOTTLE ONLY Blood Culture results may not be optimal due to an inadequate volume of blood received in culture  bottles   Culture   Final    NO GROWTH 2 DAYS Performed at [facility], [address], [place], [place] [address]    Report Status PENDING  Incomplete    RADIOLOGY STUDIES/RESULTS: CT ABDOMEN PELVIS WO CONTRAST Result Date: 04/29/2024 CLINICAL DATA:  Acute abdominal pain. EXAM: CT ABDOMEN AND PELVIS WITHOUT CONTRAST TECHNIQUE: Multidetector CT imaging of the abdomen and pelvis was performed following the standard protocol without IV contrast. RADIATION DOSE REDUCTION: This exam was performed according to the departmental dose-optimization program which includes automated exposure control, adjustment of the mA and/or kV according to patient size and/or use of iterative reconstruction technique. COMPARISON:  09/12/2023 FINDINGS: Lower chest: Heart size is normal. Three-vessel atherosclerotic coronary artery disease. Calcified plaque over the descending thoracic aorta. Lung bases are clear. Hepatobiliary: Prior cholecystectomy. Liver and biliary tree are normal. Pancreas: Mild fatty replacement of the pancreas which is otherwise normal. Spleen: Normal. Adrenals/Urinary Tract: Adrenal glands are normal. Kidneys are normal in size without hydronephrosis or nephrolithiasis. Very minimal symmetric stranding of the perinephric fat. Ureters and bladder are normal. Stomach/Bowel: Stomach and small bowel are normal. Appendix is not definitely visualized. There is diverticulosis of the colon most prominent over the descending colon. No evidence of diverticulitis. Vascular/Lymphatic: Mild calcified plaque over the abdominal aorta which is normal in caliber. No adenopathy. Reproductive: Status post hysterectomy. No adnexal masses. Other: No evidence of focal inflammatory change. Musculoskeletal: No acute findings. Mild degenerative change of the spine and hips. IMPRESSION: 1. No acute findings in the abdomen/pelvis. 2. Colonic diverticulosis without evidence of diverticulitis. 3. Aortic atherosclerosis.  Three-vessel atherosclerotic coronary artery disease. Aortic Atherosclerosis (ICD10-I70.0). Electronically Signed   By: Roda Cirri M.D.   On: 04/29/2024 15:36   CT Cervical Spine Wo Contrast Result Date: 04/29/2024 EXAM: CT CERVICAL SPINE WITHOUT CONTRAST 04/29/2024 03:01:22 PM TECHNIQUE: CT of the cervical was performed without the administration of intravenous contrast. Multiplanar reformatted images are provided for review. Automated exposure control, iterative  reconstruction, and/or weight based adjustment of the mA/kV was utilized to reduce the radiation dose to as low as reasonably achievable. COMPARISON: 08/17/2022 CLINICAL HISTORY: Neck trauma (Age >= 65y). Nausea and vomiting since last night. FINDINGS: CERVICAL SPINE: BONES/ALIGNMENT: There is no acute fracture or traumatic malalignment. DEGENERATIVE CHANGES: Multilevel degenerative changes of the cervical spine are similar to the prior study. SOFT TISSUES: There is no prevertebral soft tissue swelling. IMPRESSION: 1. No acute abnormality of the cervical spine related to the reported neck trauma. 2. Multilevel degenerative changes in the cervical spine, similar to the prior study. Electronically signed by: Audree Leas MD 04/29/2024 03:33 PM EDT RP Workstation: [id number]   CT HEAD WO CONTRAST Result Date: 04/29/2024 EXAM: CT HEAD WITHOUT 04/29/2024 03:01:22 PM TECHNIQUE: CT of the head was performed without the administration of intravenous contrast. Automated exposure control, iterative reconstruction, and/or weight based adjustment of the mA/kV was utilized to reduce the radiation dose to as low as reasonably achievable. COMPARISON: CT head without contrast 09/12/2023 CLINICAL HISTORY: Mental status change, unknown cause. Nausea and vomiting since last night. FINDINGS: BRAIN AND VENTRICLES: There is no acute intracranial hemorrhage, mass effect or midline shift. No abnormal extra-axial fluid collection. The gray-white differentiation is  maintained without evidence of an acute infarct. There is no evidence of hydrocephalus. Mild atrophy and moderate diffuse white matter changes are similar to the prior exam. ORBITS: The visualized portion of the orbits demonstrate no acute abnormality. SINUSES: The visualized paranasal sinuses and mastoid air cells demonstrate no acute abnormality. SOFT TISSUES AND SKULL: No acute abnormality of the visualized skull or soft tissues. Atherosclerotic calcifications are present in the cavernous carotid arteries bilaterally. No hyperdense vessel is present. IMPRESSION: 1. No acute intracranial abnormality. 2. Mild atrophy and moderate diffuse white matter changes, similar to the prior exam. 3. Atherosclerotic calcifications in the cavernous carotid arteries bilaterally. No hyperdense vessel. Electronically signed by: Audree Leas MD 04/29/2024 03:31 PM EDT RP Workstation: [id number]   DG Chest Portable 1 View Result Date: 04/29/2024 EXAM: 1 VIEW XRAY OF THE CHEST 04/29/2024 01:23:31 PM COMPARISON: 11/16/2022 CLINICAL HISTORY: Fall, pain. Altered mental status, nausea and vomiting. Hypertension. FINDINGS: LUNGS AND PLEURA: Lung volumes are low. No focal pulmonary opacity. No pulmonary edema. No pleural effusion. No pneumothorax. HEART AND MEDIASTINUM: No acute abnormality of the cardiac and mediastinal silhouettes. BONES AND SOFT TISSUES: No acute osseous abnormality. IMPRESSION: 1. No acute process. 2. Low lung volumes. Electronically signed by: Audree Leas MD 04/29/2024 01:51 PM EDT RP Workstation: [id number]   DG Shoulder Right Portable Result Date: 04/29/2024 CLINICAL DATA:  Fall with right shoulder pain. EXAM: RIGHT SHOULDER - 1 VIEW COMPARISON:  None Available. FINDINGS: Minimal degenerate change of the [facility] joint. Minimal degenerative change of the glenohumeral joint. No acute fracture or dislocation. Remainder the exam is unremarkable. IMPRESSION: 1. No acute findings. 2. Minimal degenerative  changes. Electronically Signed   By: Roda Cirri M.D.   On: 04/29/2024 13:50     LOS: 2 days   Kimberly Penna, MD  [facility]    To contact the attending provider between 7A-7P or the covering provider during after hours 7P-7A, please log into the web site [email or web address] and access using universal [place] password for that web site. If you do not have the password, please call the hospital operator.  05/01/2024, 9:47 AM

## 2024-05-01 NOTE — [place] FL2 (Signed)
 [place]  MEDICAID FL2 LEVEL OF CARE FORM     IDENTIFICATION  Patient Name: Cindy Huerta Birthdate: 03-15-1946 Sex: female Admission Date (Current Location): 04/29/2024  [facility] and [place] Number:  Producer, television/film/video and Address:  [facility], [address] [place], [place] [address]      Provider Number: [id number]  Attending Physician Name and Address:  Burton Casey, MD  Relative Name and Phone Number:       Current Level of Care: Hospital Recommended Level of Care: Skilled Nursing Facility Prior Approval Number:    Date Approved/Denied:   PASRR Number: [id number]  Discharge Plan: SNF    Current Diagnoses: Patient Active Problem List   Diagnosis Date Noted   Nausea and vomiting 04/29/2024   Sepsis (HCC) 09/14/2023   Acute kidney injury superimposed on chronic kidney disease (HCC) 09/13/2023   Leukocytosis 09/13/2023   Abnormal TSH 09/13/2023   AKI (acute kidney injury) (HCC) 08/09/2023   Syncope 08/09/2023   Hypomagnesemia 08/09/2023   QT prolongation 08/09/2023   Acute metabolic encephalopathy 08/09/2023   Elevated troponin 08/09/2023   Iron  deficiency anemia 08/17/2022   Elevated lactic acid level 08/17/2022   UTI (urinary tract infection) 08/17/2022   Fall at home, initial encounter 08/17/2022   Abnormal LFTs 08/17/2022   Dehydration 07/27/2022   Malnutrition of moderate degree 06/30/2022   Acute urinary retention 06/29/2022   Acute renal failure superimposed on stage 3b chronic kidney disease (HCC) 06/28/2022   DNR (do not resuscitate) 06/28/2022   Hyponatremia 06/18/2022   Hypokalemia 06/18/2022   Chronic diastolic CHF (congestive heart failure) (HCC) 06/18/2022   Dysuria 06/18/2022   HTN (hypertension) 11/24/2021   Normocytic anemia 11/24/2021   Vertigo 11/24/2021   Breast CA (HCC) 11/22/2021   Breast cancer (HCC) 11/20/2021   Intractable nausea and vomiting 10/09/2021   Weakness 10/07/2021   Carcinoma of  overlapping sites of left breast in female, estrogen receptor positive (HCC) 10/06/2021   Dizziness 10/06/2021   Rectal bleeding 10/06/2021   Nausea & vomiting 10/06/2021   GIB (gastrointestinal bleeding) 10/06/2021   Left-sided weakness 07/29/2021   HLD (hyperlipidemia) 07/29/2021   Hypothyroidism 07/29/2021   Left sided numbness 07/29/2021   Depression with anxiety    CKD (chronic kidney disease), stage IIIa    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   Epigastric pain 04/23/2021   GERD (gastroesophageal reflux disease) 04/23/2021   Benign essential hypertension 01/20/2021   History of breast cancer 01/20/2021   History of TIA (transient ischemic attack) 01/20/2021   Palpitations 01/20/2021   Malignant neoplasm of 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) 12/06/2018    Orientation RESPIRATION BLADDER Height & Weight     Time, Self, Situation, Place  Normal Continent, External catheter Weight:   Height:     BEHAVIORAL SYMPTOMS/MOOD NEUROLOGICAL BOWEL NUTRITION STATUS      Continent Diet (See dc summary)  AMBULATORY STATUS COMMUNICATION OF NEEDS Skin   Limited Assist Verbally Normal                       Personal Care Assistance Level of Assistance  Bathing, Feeding, Dressing Bathing Assistance: Limited assistance Feeding assistance: Independent Dressing Assistance: Limited assistance     Functional Limitations Info             SPECIAL CARE FACTORS FREQUENCY  PT (By licensed PT), OT (By licensed OT)     PT Frequency: 5x/week OT Frequency: 5x/week  Contractures Contractures Info: Not present    Additional Factors Info  Code Status, Allergies, Psychotropic Code Status Info: Full Allergies Info: Latex, Novocain (Procaine), Zestril (Lisinopril), Benadryl  (Diphenhydramine ), Biaxin (Clarithromycin), Roxicodone (Oxycodone) Psychotropic Info: See dc summary         Current Medications (05/01/2024):  This is the current hospital active medication  list Current Facility-Administered Medications  Medication Dose Route Frequency Provider Last Rate Last Admin   acetaminophen  (TYLENOL ) tablet 650 mg  650 mg Oral Q6H PRN Pahwani, Rinka R, MD       Or   acetaminophen  (TYLENOL ) suppository 650 mg  650 mg Rectal Q6H PRN Pahwani, Rinka R, MD       amLODipine  (NORVASC ) tablet 10 mg  10 mg Oral Daily Sundil, Subrina, MD   10 mg at 05/01/24 0981   aspirin  EC tablet 81 mg  81 mg Oral Daily Ghimire, Shanker M, MD   81 mg at 05/01/24 1914   atorvastatin  (LIPITOR ) tablet 80 mg  80 mg Oral Daily Ghimire, Shanker M, MD   80 mg at 05/01/24 7829   butalbital-acetaminophen -caffeine (FIORICET) 50-325-40 MG per tablet 1 tablet  1 tablet Oral Q6H PRN Sundil, Subrina, MD       cloNIDine  (CATAPRES ) tablet 0.1 mg  0.1 mg Oral BID Sundil, Subrina, MD   0.1 mg at 05/01/24 5621   ferrous sulfate  tablet 325 mg  325 mg Oral Q breakfast Burton Casey, MD   325 mg at 05/01/24 3086   FLUoxetine  (PROZAC ) capsule 10 mg  10 mg Oral Daily Ghimire, Shanker M, MD   10 mg at 05/01/24 0846   heparin  injection 5,000 Units  5,000 Units Subcutaneous Q8H Pahwani, Rinka R, MD   5,000 Units at 05/01/24 0510   hydrALAZINE  (APRESOLINE ) injection 20 mg  20 mg Intravenous Q6H PRN Sundil, Subrina, MD   20 mg at 04/30/24 2358   hydrALAZINE  (APRESOLINE ) tablet 50 mg  50 mg Oral TID Sundil, Subrina, MD   50 mg at 05/01/24 5784   levothyroxine  (SYNTHROID ) tablet 50 mcg  50 mcg Oral QAC breakfast Burton Casey, MD   50 mcg at 05/01/24 0510   LORazepam  (ATIVAN ) tablet 0.5 mg  0.5 mg Oral PRN Burton Casey, MD   0.5 mg at 05/01/24 0845   metoprolol  tartrate (LOPRESSOR ) tablet 25 mg  25 mg Oral BID Ghimire, Shanker M, MD   25 mg at 05/01/24 6962   ondansetron  (ZOFRAN ) tablet 4 mg  4 mg Oral Q6H PRN Pahwani, Rinka R, MD       Or   ondansetron  (ZOFRAN ) injection 4 mg  4 mg Intravenous Q6H PRN Pahwani, Rinka R, MD   4 mg at 04/29/24 1950   prochlorperazine (COMPAZINE) injection 10 mg  10  mg Intravenous Q6H PRN Sundil, Subrina, MD   10 mg at 04/30/24 0139   traZODone  (DESYREL ) tablet 50 mg  50 mg Oral QHS PRN Sundil, Subrina, MD   50 mg at 05/01/24 0022     Discharge Medications: Please see discharge summary for a list of discharge medications.  Relevant Imaging Results:  Relevant Lab Results:   Additional Information SS# [id number]  Jannice Mends, LCSW

## 2024-05-01 NOTE — Progress Notes (Signed)
 Physical Therapy Treatment Patient Details Name: Cindy Huerta MRN: [id number] DOB: 20-Feb-1946 Today's Date: 05/01/2024   History of Present Illness Pt is an 78 y/o female presenting on 04/29/24 with nausea and vomiting. Found to have leukocytosis, AKI, hyponatremia and hypertensive urgency. Imaging negative for acute abnormalities. PMH: dementia, HTN, HFpEF, CKD3    PT Comments  Pt with slower progress than expected today.  She was required increased assistance for bed mobility, very unsteady at times, and ambulated 8' with RW and assist for balance.  She had received Ativan  earlier today per RN , possibly contributing to lethargy and unsteadiness.  Yesterday , there was concern of orthostatic hypotension; today orthostatic hypotension BP were negative but of note BP has been taken on R LE.  Her HR was stable today with activity 60's-70's , were as yesterday went up to 139 bpm with activity.  Pt had some transient c/o dizziness but not the significant lightheadedness she reported yesterday.   With slower progress and continued unsteadiness updated recommendation to Patient will benefit from continued inpatient follow up therapy, <3 hours/day.   If plan is discharge home, recommend the following: A lot of help with walking and/or transfers;A lot of help with bathing/dressing/bathroom;Assistance with cooking/housework;Help with stairs or ramp for entrance   Can travel by private vehicle     Yes  Equipment Recommendations  None recommended by PT    Recommendations for Other Services       Precautions / Restrictions Precautions Precautions: Fall;Other (comment) Precaution/Restrictions Comments: monitor orthostatics and HR     Mobility  Bed Mobility Overal bed mobility: Needs Assistance Bed Mobility: Supine to Sit     Supine to sit: Min assist     General bed mobility comments: Requiring increased time and min A today    Transfers Overall transfer level: Needs  assistance Equipment used: Rolling walker (2 wheels) Transfers: Sit to/from Stand Sit to Stand: Mod assist           General transfer comment: STS x 2 with cues for hand placement and light mod A to rise and steady    Ambulation/Gait Ambulation/Gait assistance: Min assist Gait Distance (Feet): 8 Feet Assistive device: Rolling walker (2 wheels) Gait Pattern/deviations: Step-to pattern, Decreased stride length, Trunk flexed Gait velocity: decreased     General Gait Details: Pt very unsteady requiring close guarding and constant min A and prepared to provide more if needed.  Started with some steps in place and pt unsteady.  Moved chair closer and pt ambulated around bed to chair but needed assistance for balance and RW.  Tending to go forward or to sides.   Stairs             Wheelchair Mobility     Tilt Bed    Modified Rankin (Stroke Patients Only)       Balance Overall balance assessment: Needs assistance Sitting-balance support: Bilateral upper extremity supported Sitting balance-Leahy Scale: Poor Sitting balance - Comments: Pt unsteady at EOB.  At times able to maintain without UE support but most of the time using her arms and having episodes where she leans backward or to L reaching for increased support and needing min/mod A.   Standing balance support: Bilateral upper extremity supported Standing balance-Leahy Scale: Poor Standing balance comment: RW and min/mod A                            Communication    Cognition Arousal:  Alert Behavior During Therapy: WFL for tasks assessed/performed   PT - Cognitive impairments: No family/caregiver present to determine baseline, History of cognitive impairments                       PT - Cognition Comments: Pt initially lethargic and very soft spoken.  Improved throughout session but still decreased safety awareness and only oriented to self today. Was able to state her son's phone  number. Following commands: Impaired Following commands impaired: Follows one step commands with increased time    Cueing Cueing Techniques: Verbal cues, Gestural cues, Tactile cues, Visual cues  Exercises      General Comments General comments (skin integrity, edema, etc.): Pt very unsteady and swaying at times with balance.  She had received Ativan  earlier this morning. Does report hx of vertigo.  She did report some dizziness that was transient during session.    BP were as follows (taken in R LE): Supine 143/57   Sit 163/87   Stand 189/87   Reclined in chair 135/64   HR 60's-70's bpm.       Pertinent Vitals/Pain Pain Assessment Pain Assessment: No/denies pain    Home Living                          Prior Function            PT Goals (current goals can now be found in the care plan section) Progress towards PT goals: Progressing toward goals (slowly)    Frequency    Min 3X/week      PT Plan      Co-evaluation              AM-PAC PT "6 Clicks" Mobility   Outcome Measure  Help needed turning from your back to your side while in a flat bed without using bedrails?: A Little Help needed moving from lying on your back to sitting on the side of a flat bed without using bedrails?: A Lot Help needed moving to and from a bed to a chair (including a wheelchair)?: A Lot Help needed standing up from a chair using your arms (e.g., wheelchair or bedside chair)?: A Lot Help needed to walk in hospital room?: Total Help needed climbing 3-5 steps with a railing? : Total 6 Click Score: 11    End of Session Equipment Utilized During Treatment: Gait belt Activity Tolerance: Patient tolerated treatment well Patient left: with chair alarm set;in chair;with call bell/phone within reach Nurse Communication: Mobility status (notified MD and TOC of updated recommendations) PT Visit Diagnosis: Other abnormalities of gait and mobility (R26.89);Muscle weakness  (generalized) (M62.81)     Time: 1131-1209 PT Time Calculation (min) (ACUTE ONLY): 38 min  Charges:    $Gait Training: 8-22 mins $Therapeutic Activity: 23-37 mins PT General Charges $$ ACUTE PT VISIT: 1 Visit                     Cyd Dowse, PT Acute Rehab [facility] [phone]    Carolynn Citrin 05/01/2024, 12:22 PM

## 2024-05-01 NOTE — TOC Initial Note (Signed)
 Transition of Care [facility]) - Initial/Assessment Note    Patient Details  Name: Cindy Huerta MRN: [id number] Date of Birth: [birth year]  [facility]) CM/SW Contact:    Jannice Mends, LCSW Phone Number: 05/01/2024, 1:00 PM  Clinical Narrative:                 CSW received consult for possible SNF at time of discharge. CSW spoke with patient. Patient reported that she would like home health services and does not want to go to SNF, though she has been to [facility] in the past. She reported she lives with her son and his wife and he works from home. CSW discussed equipment needs and patient has a rolling walker, cane, raised toilet, shower seat, and rollator. CSW confirmed PCP and address. CSW confirmed plan with patient's son and he will be able to transport patient home tomorrow. CSW updated RNCM on need for Home Health Services. Son reports no preference.    Expected Discharge Plan: Home w Home Health Services Barriers to Discharge: Continued Medical Work up   Patient Goals and CMS Choice Patient states their goals for this hospitalization and ongoing recovery are:: Return home CMS Medicare.gov Compare Post Acute Care list provided to:: Patient Choice offered to / list presented to : Patient, Adult Children [place] ownership interest in [facility].provided to:: Patient    Expected Discharge Plan and Services In-house Referral: Clinical Social Work Discharge Planning Services: CM Consult Post Acute Care Choice: Home Health Living arrangements for the past 2 months: Single Family Home                                      Prior Living Arrangements/Services Living arrangements for the past 2 months: Single Family Home Lives with:: Adult Children Patient language and need for interpreter reviewed:: Yes Do you feel safe going back to the place where you live?: Yes      Need for Family Participation in Patient Care: Yes (Comment) Care giver support  system in place?: Yes (comment) Current home services: DME (rolling walker, raised toilet, shouwer seat, rollator) Criminal Activity/Legal Involvement Pertinent to Current Situation/Hospitalization: No - Comment as needed  Activities of Daily Living   ADL Screening (condition at time of admission) Independently performs ADLs?: Yes (appropriate for developmental age) Is the patient deaf or have difficulty hearing?: No Does the patient have difficulty seeing, even when wearing glasses/contacts?: No Does the patient have difficulty concentrating, remembering, or making decisions?: Yes  Permission Sought/Granted Permission sought to share information with : Facility Medical sales representative, Family Supports Permission granted to share information with : Yes, Verbal Permission Granted  Share Information with NAME: [name]  Permission granted to share info w AGENCY: HH  Permission granted to share info w Relationship: Son  Permission granted to share info w Contact Information: [phone]  Emotional Assessment Appearance:: Appears stated age Attitude/Demeanor/Rapport: Engaged Affect (typically observed): Accepting Orientation: : Oriented to Self, Oriented to  Time, Oriented to Place, Oriented to Situation Alcohol  / Substance Use: Not Applicable Psych Involvement: No (comment)  Admission diagnosis:  Lactic acidosis [E87.20] Hyponatremia [E87.1] Hypertensive urgency [I16.0] AKI (acute kidney injury) (HCC) [N17.9] Nausea and vomiting [R11.2] Sepsis (HCC) [A41.9] Nausea and vomiting, unspecified vomiting type [R11.2] Patient Active Problem List   Diagnosis Date Noted   Nausea and vomiting 04/29/2024   Sepsis (HCC) 09/14/2023   Acute kidney injury superimposed  on chronic kidney disease (HCC) 09/13/2023   Leukocytosis 09/13/2023   Abnormal TSH 09/13/2023   AKI (acute kidney injury) (HCC) 08/09/2023   Syncope 08/09/2023   Hypomagnesemia 08/09/2023   QT prolongation 08/09/2023   Acute  metabolic encephalopathy 08/09/2023   Elevated troponin 08/09/2023   Iron  deficiency anemia 08/17/2022   Elevated lactic acid level 08/17/2022   UTI (urinary tract infection) 08/17/2022   Fall at home, initial encounter 08/17/2022   Abnormal LFTs 08/17/2022   Dehydration 07/27/2022   Malnutrition of moderate degree 06/30/2022   Acute urinary retention 06/29/2022   Acute renal failure superimposed on stage 3b chronic kidney disease (HCC) 06/28/2022   DNR (do not resuscitate) 06/28/2022   Hyponatremia 06/18/2022   Hypokalemia 06/18/2022   Chronic diastolic CHF (congestive heart failure) (HCC) 06/18/2022   Dysuria 06/18/2022   HTN (hypertension) 11/24/2021   Normocytic anemia 11/24/2021   Vertigo 11/24/2021   Breast CA (HCC) 11/22/2021   Breast cancer (HCC) 11/20/2021   Intractable nausea and vomiting 10/09/2021   Weakness 10/07/2021   Carcinoma of overlapping sites of left breast in female, estrogen receptor positive (HCC) 10/06/2021   Dizziness 10/06/2021   Rectal bleeding 10/06/2021   Nausea & vomiting 10/06/2021   GIB (gastrointestinal bleeding) 10/06/2021   Left-sided weakness 07/29/2021   HLD (hyperlipidemia) 07/29/2021   Hypothyroidism 07/29/2021   Left sided numbness 07/29/2021   Depression with anxiety    CKD (chronic kidney disease), stage IIIa    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   Epigastric pain 04/23/2021   GERD (gastroesophageal reflux disease) 04/23/2021   Benign essential hypertension 01/20/2021   History of breast cancer 01/20/2021   History of TIA (transient ischemic attack) 01/20/2021   Palpitations 01/20/2021   Malignant neoplasm of 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) 12/06/2018   PCP:  Tita Form, MD Pharmacy:   [facility] [phone] - [address], [place] - [address] [place] [address] Phone: [phone] Fax: [phone]  [facility] [address] [place] [address] Phone:  [phone] Fax: [phone]     Social Drivers of Health (SDOH) Social History: SDOH Screenings   Food Insecurity: No Food Insecurity (04/30/2024)  Housing: Low Risk  (04/30/2024)  Transportation Needs: No Transportation Needs (04/30/2024)  Utilities: Not At Risk (04/30/2024)  Financial Resource Strain: Patient Declined (12/29/2022)   Received from [facility], [facility]  Social Connections: Socially Isolated (05/01/2024)  Tobacco Use: Low Risk  (05/01/2024)   SDOH Interventions:     Readmission Risk Interventions    05/01/2024   12:59 PM 09/13/2023   10:08 AM 06/20/2022    1:01 PM  Readmission Risk Prevention Plan  Transportation Screening  Complete   PCP or Specialist Appt within 3-5 Days  Complete Complete  HRI or Home Care Consult  Complete   Social Work Consult for Recovery Care Planning/Counseling  Complete Complete  Palliative Care Screening  Not Applicable Not Applicable  Medication Review Oceanographer) Complete Not Complete   Med Review Comments  Patient's son will speak with nurses upon discharge   PCP or Specialist appointment within 3-5 days of discharge Complete    HRI or Home Care Consult Complete    SW Recovery Care/Counseling Consult Complete    Palliative Care Screening Not Applicable    Skilled Nursing Facility Patient Refused

## 2024-05-02 DIAGNOSIS — I160 Hypertensive urgency: Secondary | ICD-10-CM | POA: Diagnosis not present

## 2024-05-02 DIAGNOSIS — N179 Acute kidney failure, unspecified: Secondary | ICD-10-CM | POA: Diagnosis not present

## 2024-05-02 DIAGNOSIS — R112 Nausea with vomiting, unspecified: Secondary | ICD-10-CM | POA: Diagnosis not present

## 2024-05-02 DIAGNOSIS — E871 Hypo-osmolality and hyponatremia: Secondary | ICD-10-CM | POA: Diagnosis not present

## 2024-05-02 MED ORDER — ORAL CARE MOUTH RINSE: 15.0000 mL | OROMUCOSAL | Status: DC | PRN

## 2024-05-02 NOTE — TOC Progression Note (Addendum)
 Transition of Care [facility]) - Progression Note    Patient Details  Name: Cindy Huerta MRN: [id number] Date of Birth: 01-12-1946  Transition of Care [facility]) CM/SW Contact  Jannice Mends, LCSW Phone Number: 05/02/2024, 9:52 AM  Clinical Narrative:    9:52 AM-CSW spoke with patient. She is now requesting SNF rehab. She cannot remember if she liked [facility] or [facility] better. We call her son, [name], and he confirmed [facility] and second is [facility] as she has been to both. CSW confirmed that [facility] can accept pending insurance authorization. [facility] is reviewing referral. CSW initiated insurance process pending a SNF choice, Ref# [id number].   12:37 PM-CSW spoke with patient's son and he would like to accept semi-private bed at [facility]. CSW updated insurance, auth pending.   2:18 PM-Insurance approval received for [facility], Ref# [id number], effective 05/02/2024-05/04/2024. CSW updated son and he is requesting [facility] for transport.   Expected Discharge Plan: Skilled Nursing Facility Barriers to Discharge: Insurance Authorization  Expected Discharge Plan and Services In-house Referral: Clinical Social Work Discharge Planning Services: CM Consult Post Acute Care Choice: Home Health Living arrangements for the past 2 months: Single Family Home                                       Social Determinants of Health (SDOH) Interventions SDOH Screenings   Food Insecurity: No Food Insecurity (04/30/2024)  Housing: Low Risk  (04/30/2024)  Transportation Needs: No Transportation Needs (04/30/2024)  Utilities: Not At Risk (04/30/2024)  Financial Resource Strain: Patient Declined (12/29/2022)   Received from [facility], [facility]  Social Connections: Socially Isolated (05/01/2024)  Tobacco Use: Low Risk  (05/01/2024)    Readmission Risk Interventions    05/01/2024   12:59 PM 09/13/2023   10:08 AM 06/20/2022    1:01 PM  Readmission  Risk Prevention Plan  Transportation Screening  Complete   PCP or Specialist Appt within 3-5 Days  Complete Complete  HRI or Home Care Consult  Complete   Social Work Consult for Recovery Care Planning/Counseling  Complete Complete  Palliative Care Screening  Not Applicable Not Applicable  Medication Review Oceanographer) Complete Not Complete   Med Review Comments  Patient's son will speak with nurses upon discharge   PCP or Specialist appointment within 3-5 days of discharge Complete    HRI or Home Care Consult Complete    SW Recovery Care/Counseling Consult Complete    Palliative Care Screening Not Applicable    Skilled Nursing Facility Patient Refused

## 2024-05-02 NOTE — Progress Notes (Signed)
 Occupational Therapy Treatment Patient Details Name: Cindy Huerta MRN: [id number] DOB: 1946/06/20 Today's Date: 05/02/2024   History of present illness Pt is an 78 y/o female presenting on 04/29/24 with nausea and vomiting. Found to have leukocytosis, AKI, hyponatremia and hypertensive urgency. Imaging negative for acute abnormalities. PMH: dementia, HTN, HFpEF, CKD3   OT comments  Pt c/o fatigue and generalized weakness, some pain to L knee with activities. Pt requires min A to assist to EOB, needs assist for BLEs and sitting up in bed. Pt poorly tolerates sitting due to dizziness and fatigue, but motivated to get into recliner and perform activities. Pt requires min/mod A for STS and balance with RW, knees buckled first attempt and had to sit back down on bed, poor safety awareness, not aware of deficits. Pt able to transfer to recliner on 2nd attempt, sits quickly. BP WNLs for supine, sitting, standing. At this time Pt not tolerating OOB activity well, postacute rehab <3hrs/day still recommended, will continue to see acutely to progress as able to maximize activity tolerance and mobility.       If plan is discharge home, recommend the following:  A little help with bathing/dressing/bathroom;A little help with walking and/or transfers;Direct supervision/assist for medications management;Direct supervision/assist for financial management;Supervision due to cognitive status   Equipment Recommendations  None recommended by OT    Recommendations for Other Services      Precautions / Restrictions Precautions Precautions: Fall;Other (comment) Recall of Precautions/Restrictions: Impaired Precaution/Restrictions Comments: monitor orthostatics and HR Restrictions Weight Bearing Restrictions Per Provider Order: No       Mobility Bed Mobility Overal bed mobility: Needs Assistance Bed Mobility: Supine to Sit     Supine to sit: Min assist     General bed mobility comments: min A for BLEs  and sitting up    Transfers Overall transfer level: Needs assistance Equipment used: Rolling walker (2 wheels) Transfers: Sit to/from Stand, Bed to chair/wheelchair/BSC Sit to Stand: Min assist, Mod assist     Step pivot transfers: Min assist, Mod assist     General transfer comment: min/mod for STS and transfer to recliner, poor standing tolerance, gets dizzy quickly     Balance Overall balance assessment: Needs assistance Sitting-balance support: No upper extremity supported, Feet supported Sitting balance-Leahy Scale: Poor Sitting balance - Comments: CGA sitting EOB due to instability and dizziness   Standing balance support: Bilateral upper extremity supported, During functional activity, Reliant on assistive device for balance Standing balance-Leahy Scale: Poor Standing balance comment: reliant on RW for support, verbal cueing for safety and hand placement, gets dizzy quickly                           ADL either performed or assessed with clinical judgement   ADL Overall ADL's : Needs assistance/impaired Eating/Feeding: Set up;Sitting               Upper Body Dressing : Set up;Supervision/safety;Sitting   Lower Body Dressing: Minimal assistance;Sitting/lateral leans;Sit to/from stand;Supervision/safety   Toilet Transfer: Minimal assistance;Moderate assistance;BSC/3in1;Rolling walker (2 wheels)             General ADL Comments: poor standing tolerance, gets dizzy and not able to control sitting safely. able to don/doff socks with increased time/effort    Extremity/Trunk Assessment Upper Extremity Assessment Upper Extremity Assessment: Generalized weakness            Vision       Perception     Praxis  Communication Communication Communication: No apparent difficulties   Cognition Arousal: Alert Behavior During Therapy: WFL for tasks assessed/performed Cognition: History of cognitive impairments             OT - Cognition  Comments: history of dementia, not fully aware of deficits, cueing for safety                 Following commands: Intact        Cueing   Cueing Techniques: Verbal cues  Exercises      Shoulder Instructions       General Comments      Pertinent Vitals/ Pain       Pain Assessment Pain Assessment: 0-10 Pain Score: 4  Pain Location: L knee with activity Pain Descriptors / Indicators: Aching, Discomfort Pain Intervention(s): Monitored during session  Home Living                                          Prior Functioning/Environment              Frequency  Min 2X/week        Progress Toward Goals  OT Goals(current goals can now be found in the care plan section)  Progress towards OT goals: Progressing toward goals  Acute Rehab OT Goals Patient Stated Goal: to improve activity tolerance OT Goal Formulation: With patient Time For Goal Achievement: 05/14/24 Potential to Achieve Goals: Good ADL Goals Pt Will Perform Lower Body Bathing: with supervision;sit to/from stand;sitting/lateral leans Pt Will Perform Lower Body Dressing: with supervision;sitting/lateral leans;sit to/from stand Pt Will Transfer to Toilet: with supervision;ambulating  Plan      Co-evaluation                 AM-PAC OT "6 Clicks" Daily Activity     Outcome Measure   Help from another person eating meals?: A Little Help from another person taking care of personal grooming?: A Little Help from another person toileting, which includes using toliet, bedpan, or urinal?: A Little Help from another person bathing (including washing, rinsing, drying)?: A Little Help from another person to put on and taking off regular upper body clothing?: A Little Help from another person to put on and taking off regular lower body clothing?: A Little 6 Click Score: 18    End of Session Equipment Utilized During Treatment: Gait belt;Rolling walker (2 wheels)  OT Visit  Diagnosis: Unsteadiness on feet (R26.81);Other abnormalities of gait and mobility (R26.89);Muscle weakness (generalized) (M62.81)   Activity Tolerance Patient tolerated treatment well   Patient Left in chair;with call bell/phone within reach;with chair alarm set   Nurse Communication Mobility status        Time: 1610-9604 OT Time Calculation (min): 25 min  Charges: OT General Charges $OT Visit: 1 Visit OT Treatments $Self Care/Home Management : 8-22 mins $Therapeutic Activity: 8-22 mins  Montrice Montuori, OTR/L   Mehtaab Mayeda R Verlie Hellenbrand 05/02/2024, 1:33 PM

## 2024-05-02 NOTE — Progress Notes (Signed)
 PT Cancellation Note  Patient Details Name: Cindy Huerta MRN: [id number] DOB: 09/22/1946   Cancelled Treatment:    Reason Eval/Treat Not Completed: (P) Patient declined, no reason specified. Pt reports they just got approval to d/c to SNF today and requesting to rest while awaiting transport. Will plan to follow-up another day if pt does not d/c.    Vernida Goodie, PT, DPT Acute Rehabilitation Services  Office: [phone]    Ellyn Hack 05/02/2024, 3:27 PM

## 2024-05-02 NOTE — TOC Transition Note (Signed)
 Transition of Care [facility]) - Discharge Note   Patient Details  Name: Cindy Huerta MRN: [id number] Date of Birth: 07-26-1946  Transition of Care [facility]) CM/SW Contact:  Jannice Mends, LCSW Phone Number: 05/02/2024, 4:00 PM   Clinical Narrative:    Patient will DC to: [facility] Anticipated DC date: 05/02/24 Family notified: Son, [name] Transport by: Lyna Sandhoff   Per MD patient ready for DC to [facility]. RN to call report prior to discharge ( [phone] room 604a). RN, patient, patient's family, and facility notified of DC. Discharge Summary and FL2 sent to facility. DC packet on chart. Ambulance transport requested for patient.   CSW will sign off for now as social work intervention is no longer needed. Please consult us  again if new needs arise.     Final next level of care: Skilled Nursing Facility Barriers to Discharge: Barriers Resolved   Patient Goals and CMS Choice Patient states their goals for this hospitalization and ongoing recovery are:: Return home CMS Medicare.gov Compare Post Acute Care list provided to:: Patient Choice offered to / list presented to : Patient, Adult Children [place] ownership interest in [place].provided to:: Patient    Discharge Placement   Existing PASRR number confirmed : 05/02/24          Patient chooses bed at: [facility] Patient to be transferred to facility by: [facility] Name of family member notified: Son Patient and family notified of of transfer: 05/02/24  Discharge Plan and Services Additional resources added to the After Visit Summary for   In-house Referral: Clinical Social Work Discharge Planning Services: CM Consult Post Acute Care Choice: Home Health                               Social Drivers of Health (SDOH) Interventions SDOH Screenings   Food Insecurity: No Food Insecurity (04/30/2024)  Housing: Low Risk  (04/30/2024)  Transportation Needs: No Transportation Needs (04/30/2024)  Utilities: Not  At Risk (04/30/2024)  Financial Resource Strain: Patient Declined (12/29/2022)   [facility], [facility]  Social Connections: Socially Isolated (05/01/2024)  Tobacco Use: Low Risk  (05/01/2024)     Readmission Risk Interventions    05/01/2024   12:59 PM 09/13/2023   10:08 AM 06/20/2022    1:01 PM  Readmission Risk Prevention Plan  Transportation Screening  Complete   PCP or Specialist Appt within 3-5 Days  Complete Complete  HRI or Home Care Consult  Complete   Social Work Consult for Recovery Care Planning/Counseling  Complete Complete  Palliative Care Screening  Not Applicable Not Applicable  Medication Review Oceanographer) Complete Not Complete   Med Review Comments  Patient's son will speak with nurses upon discharge   PCP or Specialist appointment within 3-5 days of discharge Complete    HRI or Home Care Consult Complete    SW Recovery Care/Counseling Consult Complete    Palliative Care Screening Not Applicable    Skilled Nursing Facility Patient Refused

## 2024-05-02 NOTE — Plan of Care (Signed)

## 2024-05-02 NOTE — Care Management Important Message (Signed)
 Important Message  Patient Details  Name: Cindy Huerta MRN: [id number] Date of Birth: Aug 23, 1946   Important Message Given:  Yes - Medicare IM     Wynonia Hedges 05/02/2024, 5:06 PM

## 2024-05-02 NOTE — Progress Notes (Signed)
 PROGRESS NOTE        PATIENT DETAILS Name: Cindy Huerta Age: 78 y.o. Sex: female Date of Birth: [birth year] Admit Date: 04/29/2024 Admitting Physician Elester Grim, MD ZOX:[id number], Dann Dust, MD  Brief Summary: Patient is a 78 y.o.  female with history of dementia, HTN, HFpEF, CKD stage IIIa who was brought to the ED on 5/25 for-nausea/vomiting/dry heaving/lethargy-she was found to have leukocytosis/hypertensive urgency/AKI and hyponatremia.  Significant events: 5/25>> admit to [facility]  Significant studies: 5/25>> right shoulder x-ray: No acute findings 5/25>> CXR: No acute process. 5/25>> CT head: No acute abnormality. 5/25>> CT C-spine: No acute abnormality. 5/25>> CT abdomen/pelvis: No acute findings in the abdomen/pelvis.  Significant microbiology data: 5/25>> blood culture: No growth  Procedures: None  Consults: None  Subjective: No nausea vomiting-tolerating diet.  She now has changed her mind and wants to go to SNF.  Objective: Vitals: Blood pressure 107/77, pulse 75, temperature 98.8 F (37.1 C), temperature source Oral, resp. rate 18, SpO2 98%.   Exam: Awake/ Chest: Clear to auscultation CVS: S1-S2 regular Abdomen: Soft nontender nondistended Nonfocal exam.   Pertinent Labs/Radiology:    Latest Ref Rng & Units 05/01/2024    3:55 AM 04/30/2024    4:11 AM 04/29/2024    1:06 PM  CBC  WBC 4.0 - 10.5 K/uL 11.4  15.6    Hemoglobin 12.0 - 15.0 g/dL 45.4  09.8  11.9   Hematocrit 36.0 - 46.0 % 31.1  32.8  41.0   Platelets 150 - 400 K/uL 182  229      Lab Results  Component Value Date   NA 131 (L) 05/01/2024   K 3.8 05/01/2024   CL 100 05/01/2024   CO2 24 05/01/2024     Assessment/Plan: Intractable nausea/vomiting Unclear etiology-perhaps a viral syndrome CT abdomen without any SBO or any other underlying pathology No further vomiting Tolerating regular diet Continue supportive care.  Acute metabolic encephalopathy superimposed  on dementia Probably secondary to metabolic derangements Neuroimaging negative Significantly better after treatment of underlying metabolic derangements. Supportive care Delirium precautions.  Hyponatremia Secondary to vomiting/HCTZ use Much better with IVF-follow electrolytes periodically.  Hypokalemia/hypomagnesemia/hypophosphatemia Secondary to GI loss Has been repleted.  AKI on CKD stage IIIa Secondary to vomiting along with HCTZ/triamterene/HCTZ use. AKI improved with supportive care-back to baseline Avoid nephrotoxic agents Trend electrolytes periodically.  HTN BP controlled Continue amlodipine /clonidine /hydralazine /metoprolol  Maxide/Micardis  remains on hold-will resume over the next several days.  Minimally elevated troponin No anginal symptoms-of unclear significance-possibly false positive elevation in the setting of AKI.  Chronic HFpEF Euvolemic  Hypothyroidism Synthroid  TSH minimally elevated-repeat TSH in 4 to 6 weeks once acute illness is over.  History of TIA Aspirin /statin  Depression Prozac   Dementia Delirium precautions Pleasantly confused this morning  BMI: Estimated body mass index is 24.73 kg/m as calculated from the following:   Height as of 09/13/23: 5\' 6"  (1.676 m).   Weight as of 09/13/23: 69.5 kg.   Code status:   Code Status: Full Code   DVT Prophylaxis: heparin  injection 5,000 Units Start: 04/29/24 1700 SCDs Start: 04/29/24 1650   Family Communication:Son-[name] [phone] -updated 5/26   Disposition Plan: Status is: Inpatient Remains inpatient appropriate because: Severity of illness   Planned Discharge Destination: SNF.   Diet: Diet Order             Diet Heart Room service appropriate?  Yes; Fluid consistency: Thin  Diet effective now                     Antimicrobial agents: Anti-infectives (From admission, onward)    Start     Dose/Rate Route Frequency Ordered Stop   04/29/24 1330  cefTRIAXone   (ROCEPHIN ) 1 g in sodium chloride  0.9 % 100 mL IVPB        1 g 200 mL/hr over 30 Minutes Intravenous  Once 04/29/24 1324 04/29/24 1451        MEDICATIONS: Scheduled Meds:  amLODipine   10 mg Oral Daily   aspirin  EC  81 mg Oral Daily   atorvastatin   80 mg Oral Daily   cloNIDine   0.1 mg Oral BID   ferrous sulfate   325 mg Oral Q breakfast   FLUoxetine   10 mg Oral Daily   heparin   5,000 Units Subcutaneous Q8H   hydrALAZINE   50 mg Oral TID   levothyroxine   50 mcg Oral QAC breakfast   metoprolol  tartrate  25 mg Oral BID   Continuous Infusions:   PRN Meds:.acetaminophen  **OR** acetaminophen , butalbital-acetaminophen -caffeine, hydrALAZINE , LORazepam , ondansetron  **OR** ondansetron  (ZOFRAN ) IV, mouth rinse, prochlorperazine, traZODone    I have personally reviewed following labs and imaging studies  LABORATORY DATA: CBC: Recent Labs  Lab 04/29/24 1300 04/29/24 1305 04/29/24 1306 04/30/24 0411 05/01/24 0355  WBC 19.3*  --   --  15.6* 11.4*  NEUTROABS 17.4*  --   --   --   --   HGB 13.4 13.6 13.9 11.4* 10.2*  HCT 37.4 40.0 41.0 32.8* 31.1*  MCV 85.8  --   --  87.7 92.6  PLT 293  --   --  229 182    Basic Metabolic Panel: Recent Labs  Lab 04/29/24 1300 04/29/24 1305 04/29/24 1306 04/29/24 2022 04/30/24 0411 05/01/24 0355  NA 125* 124* 124*  --  127* 131*  K 2.9* 2.9* 3.1*  --  3.0* 3.8  CL 85*  --  86*  --  94* 100  CO2 24  --   --   --  23 24  GLUCOSE 159*  --  159*  --  123* 90  BUN 14  --  15  --  17 15  CREATININE 1.42*  --  1.20*  --  1.69* 1.48*  CALCIUM  9.7  --   --   --  8.5* 8.3*  MG  --   --   --  1.1*  --  2.3  PHOS  --   --   --  1.9*  --  3.6    GFR: CrCl cannot be calculated (Unknown ideal weight.).  Liver Function Tests: Recent Labs  Lab 04/29/24 1300  AST 49*  ALT 23  ALKPHOS 67  BILITOT 0.8  PROT 8.4*  ALBUMIN 4.2   Recent Labs  Lab 04/29/24 1300  LIPASE 22   Recent Labs  Lab 04/29/24 1300  AMMONIA 15    Coagulation  Profile: No results for input(s): "INR", "PROTIME" in the last 168 hours.  Cardiac Enzymes: No results for input(s): "CKTOTAL", "CKMB", "CKMBINDEX", "TROPONINI" in the last 168 hours.  BNP (last 3 results) No results for input(s): "PROBNP" in the last 8760 hours.  Lipid Profile: No results for input(s): "CHOL", "HDL", "LDLCALC", "TRIG", "CHOLHDL", "LDLDIRECT" in the last 72 hours.  Thyroid  Function Tests: Recent Labs    04/29/24 1558  TSH 6.045*    Anemia Panel: No results for input(s): "VITAMINB12", "FOLATE", "FERRITIN", "TIBC", "IRON ", "RETICCTPCT" in  the last 72 hours.  Urine analysis:    Component Value Date/Time   COLORURINE YELLOW 04/29/2024 1334   APPEARANCEUR CLEAR 04/29/2024 1334   LABSPEC 1.013 04/29/2024 1334   PHURINE 7.0 04/29/2024 1334   GLUCOSEU NEGATIVE 04/29/2024 1334   HGBUR NEGATIVE 04/29/2024 1334   BILIRUBINUR NEGATIVE 04/29/2024 1334   KETONESUR 5 (A) 04/29/2024 1334   PROTEINUR 100 (A) 04/29/2024 1334   NITRITE NEGATIVE 04/29/2024 1334   LEUKOCYTESUR NEGATIVE 04/29/2024 1334    Sepsis Labs: Lactic Acid, Venous    Component Value Date/Time   LATICACIDVEN 1.4 04/30/2024 0411    MICROBIOLOGY: Recent Results (from the past 240 hours)  Blood culture (routine x 2)     Status: None (Preliminary result)   Collection Time: 04/29/24  1:24 PM   Specimen: BLOOD  Result Value Ref Range Status   Specimen Description BLOOD SITE NOT SPECIFIED  Final   Special Requests   Final    BOTTLES DRAWN AEROBIC AND ANAEROBIC Blood Culture adequate volume   Culture   Final    NO GROWTH 3 DAYS Performed at [facility], [address], [place], [place] [address]    Report Status PENDING  Incomplete  Blood culture (routine x 2)     Status: None (Preliminary result)   Collection Time: 04/29/24  1:29 PM   Specimen: BLOOD  Result Value Ref Range Status   Specimen Description BLOOD SITE NOT SPECIFIED  Final   Special Requests   Final    AEROBIC BOTTLE ONLY  Blood Culture results may not be optimal due to an inadequate volume of blood received in culture bottles   Culture   Final    NO GROWTH 3 DAYS Performed at [facility], [address], [place], [place] [address]    Report Status PENDING  Incomplete    RADIOLOGY STUDIES/RESULTS: No results found.    LOS: 3 days   Kimberly Penna, MD  [facility]    To contact the attending provider between 7A-7P or the covering provider during after hours 7P-7A, please log into the web site [email or web address] and access using universal [employer] password for that web site. If you do not have the password, please call the hospital operator.  05/02/2024, 10:23 AM

## 2024-05-02 NOTE — Progress Notes (Signed)
 Report given to Maggie, Charity fundraiser at [place].  All questions answered.  IV and tele removed.  Belongings packed by this RN, including a pair of glasses, cell phone, and clothing.  Pt in agreement with POC.

## 2024-05-02 NOTE — Discharge Summary (Addendum)
 PATIENT DETAILS Name: Cindy Huerta Age: 78 y.o. Sex: female Date of Birth: Apr 19, 1946 MRN: [id number]. Admitting Physician: Elester Grim, MD JXB:JYNW, Saad, MD  Admit Date: 04/29/2024 Discharge date: 05/02/2024  Recommendations for Outpatient Follow-up:  Follow up with PCP in 1-2 weeks Please obtain CMP/CBC in one week Maxide/Micardis  remains on hold-resume over the next several days-if able   Admitted From:  Home  Disposition: Skilled nursing facility   Discharge Condition: good  CODE STATUS:   Code Status: Full Code   Diet recommendation:  Diet Order             Diet Heart Room service appropriate? Yes; Fluid consistency: Thin  Diet effective now           Diet - low sodium heart healthy                    Brief Summary: Patient is a 78 y.o.  female with history of dementia, HTN, HFpEF, CKD stage IIIa who was brought to the ED on 5/25 for-nausea/vomiting/dry heaving/lethargy-she was found to have leukocytosis/hypertensive urgency/AKI and hyponatremia.   Significant events: 5/25>> admit to [facility]   Significant studies: 5/25>> right shoulder x-ray: No acute findings 5/25>> CXR: No acute process. 5/25>> CT head: No acute abnormality. 5/25>> CT C-spine: No acute abnormality. 5/25>> CT abdomen/pelvis: No acute findings in the abdomen/pelvis.   Significant microbiology data: 5/25>> blood culture: No growth   Procedures: None   Consults: None  Brief Hospital Course: Intractable nausea/vomiting Unclear etiology-perhaps a viral syndrome CT abdomen without any SBO or any other underlying pathology No further vomiting Tolerating regular diet Continue supportive care.   Acute metabolic encephalopathy superimposed on dementia Probably secondary to metabolic derangements Neuroimaging negative Significantly better after treatment of underlying metabolic derangements. Supportive care Delirium precautions.   Hyponatremia Secondary to vomiting/HCTZ  use Much better with IVF-follow electrolytes periodically.   Hypokalemia/hypomagnesemia/hypophosphatemia Secondary to GI loss Has been repleted.   AKI on CKD stage IIIa Secondary to vomiting along with ARB/triamterene/HCTZ use. AKI improved with supportive care-back to baseline Avoid nephrotoxic agents Trend electrolytes periodically.   HTN BP controlled Continue amlodipine /clonidine /hydralazine /metoprolol  Maxide/Micardis  remains on hold-resume over the next several days-if able   Minimally elevated troponin No anginal symptoms-of unclear significance-possibly false positive elevation in the setting of AKI.   Chronic HFpEF Euvolemic   Hypothyroidism Synthroid  TSH minimally elevated-repeat TSH in 4 to 6 weeks once acute illness is over.   History of TIA Aspirin /statin   Depression Prozac    Dementia Delirium precautions Pleasantly confused this morning   BMI: Estimated body mass index is 24.73 kg/m as calculated from the following:   Height as of 09/13/23: 5\' 6"  (1.676 m).   Weight as of 09/13/23: 69.5 kg.   Discharge Diagnoses:  Principal Problem:   Sepsis (HCC) Active Problems:   Nausea and vomiting   Discharge Instructions:  Activity:  As tolerated with Full fall precautions use walker/cane & assistance as needed  Discharge Instructions     Call MD for:  extreme fatigue   Complete by: As directed    Call MD for:  persistant nausea and vomiting   Complete by: As directed    Diet - low sodium heart healthy   Complete by: As directed    Discharge instructions   Complete by: As directed    Follow with Primary MD  Tita Form, MD in 1-2 weeks  Please get a complete blood count and chemistry panel checked by your Primary MD  at your next visit, and again as instructed by your Primary MD.  Get Medicines reviewed and adjusted: Please take all your medications with you for your next visit with your Primary MD  Laboratory/radiological data: Please request  your Primary MD to go over all hospital tests and procedure/radiological results at the follow up, please ask your Primary MD to get all Hospital records sent to his/her office.  In some cases, they will be blood work, cultures and biopsy results pending at the time of your discharge. Please request that your primary care M.D. follows up on these results.  Also Note the following: If you experience worsening of your admission symptoms, develop shortness of breath, life threatening emergency, suicidal or homicidal thoughts you must seek medical attention immediately by calling 911 or calling your MD immediately  if symptoms less severe.  You must read complete instructions/literature along with all the possible adverse reactions/side effects for all the Medicines you take and that have been prescribed to you. Take any new Medicines after you have completely understood and accpet all the possible adverse reactions/side effects.   Do not drive when taking Pain medications or sleeping medications (Benzodaizepines)  Do not take more than prescribed Pain, Sleep and Anxiety Medications. It is not advisable to combine anxiety,sleep and pain medications without talking with your primary care practitioner  Special Instructions: If you have smoked or chewed Tobacco  in the last 2 yrs please stop smoking, stop any regular Alcohol   and or any Recreational drug use.  Wear Seat belts while driving.  Please note: You were cared for by a hospitalist during your hospital stay. Once you are discharged, your primary care physician will handle any further medical issues. Please note that NO REFILLS for any discharge medications will be authorized once you are discharged, as it is imperative that you return to your primary care physician (or establish a relationship with a primary care physician if you do not have one) for your post hospital discharge needs so that they can reassess your need for medications and monitor  your lab values.   Increase activity slowly   Complete by: As directed       Allergies as of 05/02/2024       Reactions   Latex Rash   Novocain [procaine] Other (See Comments)   Unsure - told by DDS not to let anyone give it to her  Confusion   Zestril [lisinopril] Cough   Benadryl  [diphenhydramine ] Other (See Comments)   Jitteriness Agitation   Biaxin [clarithromycin] Other (See Comments)   Confusion   Roxicodone [oxycodone] Nausea And Vomiting, Anxiety        Medication List     STOP taking these medications    LORazepam  0.5 MG tablet Commonly known as: ATIVAN    potassium chloride  SA 20 MEQ tablet Commonly known as: KLOR-CON  M   telmisartan  80 MG tablet Commonly known as: MICARDIS    triamterene-hydrochlorothiazide 37.5-25 MG tablet Commonly known as: MAXZIDE-25       TAKE these medications    acetaminophen  500 MG tablet Commonly known as: TYLENOL  Take 500-1,000 mg by mouth every 6 (six) hours as needed for moderate pain.   amLODipine  10 MG tablet Commonly known as: NORVASC  Take 1 tablet (10 mg total) by mouth daily.   Artificial Tears 0.1-0.3 % Soln Generic drug: Dextran 70-Hypromellose Place 1 drop into both eyes daily as needed for dry eyes.   aspirin  EC 81 MG tablet Take 1 tablet (81 mg total) by mouth  daily. RESTART 48HRS AFTER DISCHARGE   atorvastatin  80 MG tablet Commonly known as: LIPITOR  Take 80 mg by mouth daily.   cloNIDine  0.1 MG tablet Commonly known as: CATAPRES  Take 0.1 mg by mouth 2 (two) times daily.   ferrous sulfate  325 (65 FE) MG EC tablet Take 325 mg by mouth daily with breakfast.   FLUoxetine  10 MG capsule Commonly known as: PROZAC  Take 1 capsule (10 mg total) by mouth daily.   fluticasone  50 MCG/ACT nasal spray Commonly known as: FLONASE  Place 1 spray into both nostrils daily as needed for allergies or rhinitis.   hydrALAZINE  50 MG tablet Commonly known as: APRESOLINE  Take 50 mg by mouth 3 (three) times daily.    levothyroxine  50 MCG tablet Commonly known as: SYNTHROID  Take 50 mcg by mouth daily before breakfast.   magnesium  oxide 400 MG tablet Commonly known as: MAG-OX Take 1 tablet (400 mg total) by mouth daily.   meclizine  12.5 MG tablet Commonly known as: ANTIVERT  Take 12.5 mg by mouth as needed for dizziness.   metoprolol  succinate 50 MG 24 hr tablet Commonly known as: TOPROL -XL Take 50 mg by mouth 2 (two) times daily.   pantoprazole  40 MG tablet Commonly known as: PROTONIX  Take 1 tablet (40 mg total) by mouth daily.   trolamine salicylate 10 % cream Commonly known as: ASPERCREME Apply 1 application topically as needed for muscle pain.   VITAMIN D -3 PO Take 1 capsule by mouth daily.        Contact information for follow-up providers     Tita Form, MD. Schedule an appointment as soon as possible for a visit in 1 week(s).   Specialty: Internal Medicine Contact information: [address] [place] [address] [phone]              Contact information for after-discharge care     Destination     [facility]   Service: Skilled Nursing Contact information: [address] [place]  [address] [phone]                    Allergies  Allergen Reactions   Latex Rash   Novocain [Procaine] Other (See Comments)    Unsure - told by DDS not to let anyone give it to her  Confusion    Zestril [Lisinopril] Cough   Benadryl  [Diphenhydramine ] Other (See Comments)    Jitteriness Agitation   Biaxin [Clarithromycin] Other (See Comments)    Confusion     Roxicodone [Oxycodone] Nausea And Vomiting and Anxiety     Other Procedures/Studies: CT ABDOMEN PELVIS WO CONTRAST Result Date: 04/29/2024 CLINICAL DATA:  Acute abdominal pain. EXAM: CT ABDOMEN AND PELVIS WITHOUT CONTRAST TECHNIQUE: Multidetector CT imaging of the abdomen and pelvis was performed following the standard protocol without IV contrast. RADIATION  DOSE REDUCTION: This exam was performed according to the departmental dose-optimization program which includes automated exposure control, adjustment of the mA and/or kV according to patient size and/or use of iterative reconstruction technique. COMPARISON:  09/12/2023 FINDINGS: Lower chest: Heart size is normal. Three-vessel atherosclerotic coronary artery disease. Calcified plaque over the descending thoracic aorta. Lung bases are clear. Hepatobiliary: Prior cholecystectomy. Liver and biliary tree are normal. Pancreas: Mild fatty replacement of the pancreas which is otherwise normal. Spleen: Normal. Adrenals/Urinary Tract: Adrenal glands are normal. Kidneys are normal in size without hydronephrosis or nephrolithiasis. Very minimal symmetric stranding of the perinephric fat. Ureters and bladder are normal. Stomach/Bowel: Stomach and small bowel are  normal. Appendix is not definitely visualized. There is diverticulosis of the colon most prominent over the descending colon. No evidence of diverticulitis. Vascular/Lymphatic: Mild calcified plaque over the abdominal aorta which is normal in caliber. No adenopathy. Reproductive: Status post hysterectomy. No adnexal masses. Other: No evidence of focal inflammatory change. Musculoskeletal: No acute findings. Mild degenerative change of the spine and hips. IMPRESSION: 1. No acute findings in the abdomen/pelvis. 2. Colonic diverticulosis without evidence of diverticulitis. 3. Aortic atherosclerosis. Three-vessel atherosclerotic coronary artery disease. Aortic Atherosclerosis (ICD10-I70.0). Electronically Signed   By: Roda Cirri M.D.   On: 04/29/2024 15:36   CT Cervical Spine Wo Contrast Result Date: 04/29/2024 EXAM: CT CERVICAL SPINE WITHOUT CONTRAST 04/29/2024 03:01:22 PM TECHNIQUE: CT of the cervical was performed without the administration of intravenous contrast. Multiplanar reformatted images are provided for review. Automated exposure control, iterative  reconstruction, and/or weight based adjustment of the mA/kV was utilized to reduce the radiation dose to as low as reasonably achievable. COMPARISON: 08/17/2022 CLINICAL HISTORY: Neck trauma (Age >= 65y). Nausea and vomiting since last night. FINDINGS: CERVICAL SPINE: BONES/ALIGNMENT: There is no acute fracture or traumatic malalignment. DEGENERATIVE CHANGES: Multilevel degenerative changes of the cervical spine are similar to the prior study. SOFT TISSUES: There is no prevertebral soft tissue swelling. IMPRESSION: 1. No acute abnormality of the cervical spine related to the reported neck trauma. 2. Multilevel degenerative changes in the cervical spine, similar to the prior study. Electronically signed by: Audree Leas MD 04/29/2024 03:33 PM EDT RP Workstation: [id number]   CT HEAD WO CONTRAST Result Date: 04/29/2024 EXAM: CT HEAD WITHOUT 04/29/2024 03:01:22 PM TECHNIQUE: CT of the head was performed without the administration of intravenous contrast. Automated exposure control, iterative reconstruction, and/or weight based adjustment of the mA/kV was utilized to reduce the radiation dose to as low as reasonably achievable. COMPARISON: CT head without contrast 09/12/2023 CLINICAL HISTORY: Mental status change, unknown cause. Nausea and vomiting since last night. FINDINGS: BRAIN AND VENTRICLES: There is no acute intracranial hemorrhage, mass effect or midline shift. No abnormal extra-axial fluid collection. The gray-white differentiation is maintained without evidence of an acute infarct. There is no evidence of hydrocephalus. Mild atrophy and moderate diffuse white matter changes are similar to the prior exam. ORBITS: The visualized portion of the orbits demonstrate no acute abnormality. SINUSES: The visualized paranasal sinuses and mastoid air cells demonstrate no acute abnormality. SOFT TISSUES AND SKULL: No acute abnormality of the visualized skull or soft tissues. Atherosclerotic calcifications are  present in the cavernous carotid arteries bilaterally. No hyperdense vessel is present. IMPRESSION: 1. No acute intracranial abnormality. 2. Mild atrophy and moderate diffuse white matter changes, similar to the prior exam. 3. Atherosclerotic calcifications in the cavernous carotid arteries bilaterally. No hyperdense vessel. Electronically signed by: Audree Leas MD 04/29/2024 03:31 PM EDT RP Workstation: [id number]   DG Chest Portable 1 View Result Date: 04/29/2024 EXAM: 1 VIEW XRAY OF THE CHEST 04/29/2024 01:23:31 PM COMPARISON: 11/16/2022 CLINICAL HISTORY: Fall, pain. Altered mental status, nausea and vomiting. Hypertension. FINDINGS: LUNGS AND PLEURA: Lung volumes are low. No focal pulmonary opacity. No pulmonary edema. No pleural effusion. No pneumothorax. HEART AND MEDIASTINUM: No acute abnormality of the cardiac and mediastinal silhouettes. BONES AND SOFT TISSUES: No acute osseous abnormality. IMPRESSION: 1. No acute process. 2. Low lung volumes. Electronically signed by: Audree Leas MD 04/29/2024 01:51 PM EDT RP Workstation: [id number]   DG Shoulder Right Portable Result Date: 04/29/2024 CLINICAL DATA:  Fall with right shoulder pain. EXAM: RIGHT SHOULDER - 1  VIEW COMPARISON:  None Available. FINDINGS: Minimal degenerate change of the [facility] joint. Minimal degenerative change of the glenohumeral joint. No acute fracture or dislocation. Remainder the exam is unremarkable. IMPRESSION: 1. No acute findings. 2. Minimal degenerative changes. Electronically Signed   By: Roda Cirri M.D.   On: 04/29/2024 13:50     TODAY-DAY OF DISCHARGE:  Subjective:   Lataja Newland today has no headache,no chest abdominal pain,no new weakness tingling or numbness, feels much better wants to go home today.  Objective:   Blood pressure 129/64, pulse 80, temperature 98.2 F (36.8 C), temperature source Oral, resp. rate 20, SpO2 98%.  Intake/Output Summary (Last 24 hours) at 05/02/2024 1420 Last data  filed at 05/02/2024 1225 Gross per 24 hour  Intake 480 ml  Output 1025 ml  Net -545 ml   There were no vitals filed for this visit.  Exam: Awake Alert, Oriented *3, No new F.N deficits, Normal affect [place].AT,PERRAL Supple Neck,No JVD, No cervical lymphadenopathy appriciated.  Symmetrical Chest wall movement, Good air movement bilaterally, CTAB RRR,No Gallops,Rubs or new Murmurs, No Parasternal Heave +ve B.Sounds, Abd Soft, Non tender, No organomegaly appriciated, No rebound -guarding or rigidity. No Cyanosis, Clubbing or edema, No new Rash or bruise   PERTINENT RADIOLOGIC STUDIES: No results found.   PERTINENT LAB RESULTS: CBC: Recent Labs    04/30/24 0411 05/01/24 0355  WBC 15.6* 11.4*  HGB 11.4* 10.2*  HCT 32.8* 31.1*  PLT 229 182   CMET CMP     Component Value Date/Time   NA 131 (L) 05/01/2024 0355   K 3.8 05/01/2024 0355   CL 100 05/01/2024 0355   CO2 24 05/01/2024 0355   GLUCOSE 90 05/01/2024 0355   BUN 15 05/01/2024 0355   CREATININE 1.48 (H) 05/01/2024 0355   CALCIUM  8.3 (L) 05/01/2024 0355   PROT 8.4 (H) 04/29/2024 1300   ALBUMIN 4.2 04/29/2024 1300   AST 49 (H) 04/29/2024 1300   ALT 23 04/29/2024 1300   ALKPHOS 67 04/29/2024 1300   BILITOT 0.8 04/29/2024 1300   GFRNONAA 36 (L) 05/01/2024 0355    GFR CrCl cannot be calculated (Unknown ideal weight.). No results for input(s): "LIPASE", "AMYLASE" in the last 72 hours. No results for input(s): "CKTOTAL", "CKMB", "CKMBINDEX", "TROPONINI" in the last 72 hours. Invalid input(s): "POCBNP" No results for input(s): "DDIMER" in the last 72 hours. No results for input(s): "HGBA1C" in the last 72 hours. No results for input(s): "CHOL", "HDL", "LDLCALC", "TRIG", "CHOLHDL", "LDLDIRECT" in the last 72 hours. Recent Labs    04/29/24 1558  TSH 6.045*   No results for input(s): "VITAMINB12", "FOLATE", "FERRITIN", "TIBC", "IRON ", "RETICCTPCT" in the last 72 hours. Coags: No results for input(s): "INR" in the  last 72 hours.  Invalid input(s): "PT" Microbiology: Recent Results (from the past 240 hours)  Blood culture (routine x 2)     Status: None (Preliminary result)   Collection Time: 04/29/24  1:24 PM   Specimen: BLOOD  Result Value Ref Range Status   Specimen Description BLOOD SITE NOT SPECIFIED  Final   Special Requests   Final    BOTTLES DRAWN AEROBIC AND ANAEROBIC Blood Culture adequate volume   Culture   Final    NO GROWTH 3 DAYS Performed at [facility], [address] [place], [place] [address]    Report Status PENDING  Incomplete  Blood culture (routine x 2)     Status: None (Preliminary result)   Collection Time: 04/29/24  1:29 PM  Specimen: BLOOD  Result Value Ref Range Status   Specimen Description BLOOD SITE NOT SPECIFIED  Final   Special Requests   Final    AEROBIC BOTTLE ONLY Blood Culture results may not be optimal due to an inadequate volume of blood received in culture bottles   Culture   Final    NO GROWTH 3 DAYS Performed at [facility], [address] [place], [place] [address]    Report Status PENDING  Incomplete    FURTHER DISCHARGE INSTRUCTIONS:  Get Medicines reviewed and adjusted: Please take all your medications with you for your next visit with your Primary MD  Laboratory/radiological data: Please request your Primary MD to go over all hospital tests and procedure/radiological results at the follow up, please ask your Primary MD to get all Hospital records sent to his/her office.  In some cases, they will be blood work, cultures and biopsy results pending at the time of your discharge. Please request that your primary care M.D. goes through all the records of your hospital data and follows up on these results.  Also Note the following: If you experience worsening of your admission symptoms, develop shortness of breath, life threatening emergency, suicidal or homicidal thoughts you must seek medical attention immediately by calling 911  or calling your MD immediately  if symptoms less severe.  You must read complete instructions/literature along with all the possible adverse reactions/side effects for all the Medicines you take and that have been prescribed to you. Take any new Medicines after you have completely understood and accpet all the possible adverse reactions/side effects.   Do not drive when taking Pain medications or sleeping medications (Benzodaizepines)  Do not take more than prescribed Pain, Sleep and Anxiety Medications. It is not advisable to combine anxiety,sleep and pain medications without talking with your primary care practitioner  Special Instructions: If you have smoked or chewed Tobacco  in the last 2 yrs please stop smoking, stop any regular Alcohol   and or any Recreational drug use.  Wear Seat belts while driving.  Please note: You were cared for by a hospitalist during your hospital stay. Once you are discharged, your primary care physician will handle any further medical issues. Please note that NO REFILLS for any discharge medications will be authorized once you are discharged, as it is imperative that you return to your primary care physician (or establish a relationship with a primary care physician if you do not have one) for your post hospital discharge needs so that they can reassess your need for medications and monitor your lab values.  Total Time spent coordinating discharge including counseling, education and face to face time equals greater than 30 minutes.  Signed: Natasha Paulson 05/02/2024 2:20 PM

## 2024-05-03 DIAGNOSIS — E785 Hyperlipidemia, unspecified: Secondary | ICD-10-CM | POA: Diagnosis not present

## 2024-05-03 DIAGNOSIS — N183 Chronic kidney disease, stage 3 unspecified: Secondary | ICD-10-CM | POA: Diagnosis not present

## 2024-05-03 DIAGNOSIS — R531 Weakness: Secondary | ICD-10-CM | POA: Diagnosis not present

## 2024-05-03 DIAGNOSIS — I10 Essential (primary) hypertension: Secondary | ICD-10-CM | POA: Diagnosis not present

## 2024-05-04 LAB — CULTURE, BLOOD (ROUTINE X 2)
Culture: NO GROWTH
Culture: NO GROWTH
Special Requests: ADEQUATE

## 2024-05-24 ENCOUNTER — Other Ambulatory Visit: Payer: Self-pay

## 2024-05-24 ENCOUNTER — Emergency Department (HOSPITAL_COMMUNITY)

## 2024-05-24 ENCOUNTER — Encounter (HOSPITAL_COMMUNITY): Payer: Self-pay | Admitting: Emergency Medicine

## 2024-05-24 ENCOUNTER — Inpatient Hospital Stay (HOSPITAL_COMMUNITY)
Admission: EM | Admit: 2024-05-24 | Discharge: 2024-05-31 | DRG: 866 | Disposition: A | Discharge status: Home / Self Care | Attending: Family Medicine | Admitting: Family Medicine

## 2024-05-24 DIAGNOSIS — Z90710 Acquired absence of both cervix and uterus: Secondary | ICD-10-CM

## 2024-05-24 DIAGNOSIS — E872 Acidosis, unspecified: Secondary | ICD-10-CM | POA: Diagnosis present

## 2024-05-24 DIAGNOSIS — D72829 Elevated white blood cell count, unspecified: Secondary | ICD-10-CM | POA: Diagnosis present

## 2024-05-24 DIAGNOSIS — E871 Hypo-osmolality and hyponatremia: Secondary | ICD-10-CM | POA: Diagnosis present

## 2024-05-24 DIAGNOSIS — Z8673 Personal history of transient ischemic attack (TIA), and cerebral infarction without residual deficits: Secondary | ICD-10-CM

## 2024-05-24 DIAGNOSIS — F418 Other specified anxiety disorders: Secondary | ICD-10-CM | POA: Diagnosis present

## 2024-05-24 DIAGNOSIS — E039 Hypothyroidism, unspecified: Secondary | ICD-10-CM | POA: Diagnosis present

## 2024-05-24 DIAGNOSIS — I160 Hypertensive urgency: Secondary | ICD-10-CM | POA: Diagnosis present

## 2024-05-24 DIAGNOSIS — B349 Viral infection, unspecified: Secondary | ICD-10-CM | POA: Diagnosis not present

## 2024-05-24 DIAGNOSIS — F0394 Unspecified dementia, unspecified severity, with anxiety: Secondary | ICD-10-CM | POA: Diagnosis present

## 2024-05-24 DIAGNOSIS — R112 Nausea with vomiting, unspecified: Secondary | ICD-10-CM | POA: Diagnosis not present

## 2024-05-24 DIAGNOSIS — I2510 Atherosclerotic heart disease of native coronary artery without angina pectoris: Secondary | ICD-10-CM | POA: Diagnosis present

## 2024-05-24 DIAGNOSIS — F0393 Unspecified dementia, unspecified severity, with mood disturbance: Secondary | ICD-10-CM | POA: Diagnosis present

## 2024-05-24 DIAGNOSIS — Z8249 Family history of ischemic heart disease and other diseases of the circulatory system: Secondary | ICD-10-CM

## 2024-05-24 DIAGNOSIS — E785 Hyperlipidemia, unspecified: Secondary | ICD-10-CM | POA: Diagnosis present

## 2024-05-24 DIAGNOSIS — E860 Dehydration: Secondary | ICD-10-CM | POA: Diagnosis present

## 2024-05-24 DIAGNOSIS — Z66 Do not resuscitate: Secondary | ICD-10-CM | POA: Diagnosis present

## 2024-05-24 DIAGNOSIS — R7989 Other specified abnormal findings of blood chemistry: Secondary | ICD-10-CM | POA: Diagnosis present

## 2024-05-24 DIAGNOSIS — E0781 Sick-euthyroid syndrome: Secondary | ICD-10-CM | POA: Diagnosis present

## 2024-05-24 DIAGNOSIS — I130 Hypertensive heart and chronic kidney disease with heart failure and stage 1 through stage 4 chronic kidney disease, or unspecified chronic kidney disease: Secondary | ICD-10-CM | POA: Diagnosis present

## 2024-05-24 DIAGNOSIS — Z79890 Hormone replacement therapy (postmenopausal): Secondary | ICD-10-CM

## 2024-05-24 DIAGNOSIS — I5032 Chronic diastolic (congestive) heart failure: Secondary | ICD-10-CM | POA: Diagnosis present

## 2024-05-24 DIAGNOSIS — N183 Chronic kidney disease, stage 3 unspecified: Secondary | ICD-10-CM | POA: Diagnosis present

## 2024-05-24 DIAGNOSIS — Z7982 Long term (current) use of aspirin: Secondary | ICD-10-CM

## 2024-05-24 DIAGNOSIS — R42 Dizziness and giddiness: Secondary | ICD-10-CM

## 2024-05-24 DIAGNOSIS — N1831 Chronic kidney disease, stage 3a: Secondary | ICD-10-CM | POA: Diagnosis present

## 2024-05-24 DIAGNOSIS — E878 Other disorders of electrolyte and fluid balance, not elsewhere classified: Secondary | ICD-10-CM | POA: Diagnosis present

## 2024-05-24 DIAGNOSIS — Z9012 Acquired absence of left breast and nipple: Secondary | ICD-10-CM

## 2024-05-24 DIAGNOSIS — R9431 Abnormal electrocardiogram [ECG] [EKG]: Secondary | ICD-10-CM | POA: Diagnosis present

## 2024-05-24 DIAGNOSIS — F0390 Unspecified dementia without behavioral disturbance: Secondary | ICD-10-CM | POA: Diagnosis present

## 2024-05-24 DIAGNOSIS — Z79899 Other long term (current) drug therapy: Secondary | ICD-10-CM

## 2024-05-24 DIAGNOSIS — F32A Depression, unspecified: Secondary | ICD-10-CM | POA: Diagnosis present

## 2024-05-24 DIAGNOSIS — Z91040 Latex allergy status: Secondary | ICD-10-CM

## 2024-05-24 DIAGNOSIS — Z751 Person awaiting admission to adequate facility elsewhere: Secondary | ICD-10-CM

## 2024-05-24 DIAGNOSIS — Z853 Personal history of malignant neoplasm of breast: Secondary | ICD-10-CM

## 2024-05-24 DIAGNOSIS — K5730 Diverticulosis of large intestine without perforation or abscess without bleeding: Secondary | ICD-10-CM | POA: Diagnosis present

## 2024-05-24 LAB — CBC WITH DIFFERENTIAL/PLATELET
Abs Immature Granulocytes: 0.12 10*3/uL — ABNORMAL HIGH (ref 0.00–0.07)
Basophils Absolute: 0 10*3/uL (ref 0.0–0.1)
Basophils Relative: 0 %
Eosinophils Absolute: 0 10*3/uL (ref 0.0–0.5)
Eosinophils Relative: 0 %
HCT: 37 % (ref 36.0–46.0)
Hemoglobin: 13.1 g/dL (ref 12.0–15.0)
Immature Granulocytes: 1 %
Lymphocytes Relative: 3 %
Lymphs Abs: 0.6 10*3/uL — ABNORMAL LOW (ref 0.7–4.0)
MCH: 30.8 pg (ref 26.0–34.0)
MCHC: 35.4 g/dL (ref 30.0–36.0)
MCV: 87.1 fL (ref 80.0–100.0)
Monocytes Absolute: 1.1 10*3/uL — ABNORMAL HIGH (ref 0.1–1.0)
Monocytes Relative: 6 %
Neutro Abs: 16.9 10*3/uL — ABNORMAL HIGH (ref 1.7–7.7)
Neutrophils Relative %: 90 %
Platelets: 270 10*3/uL (ref 150–400)
RBC: 4.25 MIL/uL (ref 3.87–5.11)
RDW: 12.2 % (ref 11.5–15.5)
WBC: 18.8 10*3/uL — ABNORMAL HIGH (ref 4.0–10.5)
nRBC: 0 % (ref 0.0–0.2)

## 2024-05-24 LAB — COMPREHENSIVE METABOLIC PANEL WITH GFR
ALT: 23 U/L (ref 0–44)
AST: 47 U/L — ABNORMAL HIGH (ref 15–41)
Albumin: 4.1 g/dL (ref 3.5–5.0)
Alkaline Phosphatase: 57 U/L (ref 38–126)
Anion gap: 20 — ABNORMAL HIGH (ref 5–15)
BUN: 8 mg/dL (ref 8–23)
CO2: 20 mmol/L — ABNORMAL LOW (ref 22–32)
Calcium: 10.2 mg/dL (ref 8.9–10.3)
Chloride: 85 mmol/L — ABNORMAL LOW (ref 98–111)
Creatinine, Ser: 1.29 mg/dL — ABNORMAL HIGH (ref 0.44–1.00)
GFR, Estimated: 42 mL/min — ABNORMAL LOW (ref >60)
Glucose, Bld: 197 mg/dL — ABNORMAL HIGH (ref 70–99)
Potassium: 3.5 mmol/L (ref 3.5–5.1)
Sodium: 125 mmol/L — ABNORMAL LOW (ref 135–145)
Total Bilirubin: 0.9 mg/dL (ref 0.0–1.2)
Total Protein: 8.5 g/dL — ABNORMAL HIGH (ref 6.5–8.1)

## 2024-05-24 LAB — MAGNESIUM: Magnesium: 1.4 mg/dL — ABNORMAL LOW (ref 1.7–2.4)

## 2024-05-24 LAB — LIPASE, BLOOD: Lipase: 21 U/L (ref 11–51)

## 2024-05-24 MED ORDER — FAMOTIDINE IN NACL 20-0.9 MG/50ML-% IV SOLN
20.0000 mg | Freq: Once | INTRAVENOUS | Status: AC
  Administered 2024-05-24: 20 mg via INTRAVENOUS
  Filled 2024-05-24: qty 50

## 2024-05-24 MED ORDER — METOCLOPRAMIDE HCL 5 MG/ML IJ SOLN
10.0000 mg | Freq: Once | INTRAMUSCULAR | Status: AC
  Administered 2024-05-24: 10 mg via INTRAVENOUS
  Filled 2024-05-24: qty 2

## 2024-05-24 MED ORDER — MECLIZINE HCL 25 MG PO TABS
25.0000 mg | ORAL_TABLET | Freq: Once | ORAL | Status: AC
  Administered 2024-05-24: 25 mg via ORAL
  Filled 2024-05-24: qty 1

## 2024-05-24 MED ORDER — LACTATED RINGERS IV BOLUS
1000.0000 mL | Freq: Once | INTRAVENOUS | Status: AC
  Administered 2024-05-24: 1000 mL via INTRAVENOUS

## 2024-05-24 MED ORDER — MAGNESIUM SULFATE 2 GM/50ML IV SOLN
2.0000 g | Freq: Once | INTRAVENOUS | Status: AC
  Administered 2024-05-25: 2 g via INTRAVENOUS
  Filled 2024-05-24: qty 50

## 2024-05-24 NOTE — ED Provider Notes (Cosign Needed)
 [facility] Provider Note   CSN: [id number] Arrival date & time: 05/24/24  2059     History Chief Complaint  Patient presents with   Headache   Abdominal Pain   Chest Pain    Cindy Huerta is a 78 y.o. female w/ PMHx CKD, HTN, GERD, GIB, TIA, dementia hypothyroidism, intractable nausea vomiting who presents to the ED for evaluation of nausea vomiting.  Patient reports nausea and vomiting began at approximately 2 PM today.  She states she is still drinking water .  She feels hot.  She reports pain throughout her abdomen worse in the left upper quadrant.  She complains of dizziness.  She denies chest pain shortness of breath.  She is unsure if she has dysuria.  She is oriented to self only.    Physical Exam Updated Vital Signs BP (!) 154/107 (BP Location: Right Arm)   Pulse 84   Temp 99.1 F (37.3 C) (Oral)   Resp 18   Ht 5' 6 (1.676 m)   Wt 69.5 kg   SpO2 100%   BMI 24.73 kg/m  Physical Exam Vitals and nursing note reviewed.  Constitutional:      Appearance: She is well-developed. She is obese. She is ill-appearing.  HENT:     Head: Normocephalic and atraumatic.     Mouth/Throat:     Comments: Dry mm  Eyes:     Extraocular Movements: Extraocular movements intact.     Conjunctiva/sclera: Conjunctivae normal.    Cardiovascular:     Rate and Rhythm: Normal rate and regular rhythm.     Heart sounds: Normal heart sounds. No murmur heard. Pulmonary:     Effort: Pulmonary effort is normal. Tachypnea present. No respiratory distress.     Breath sounds: Normal breath sounds.  Abdominal:     Palpations: Abdomen is soft.     Tenderness: There is no abdominal tenderness.   Musculoskeletal:        General: No swelling.     Cervical back: Neck supple.   Skin:    General: Skin is warm and dry.     Capillary Refill: Capillary refill takes less than 2 seconds.   Neurological:     Mental Status: She is alert.     Comments:  Oriented to self only  Psychiatric:        Mood and Affect: Mood is anxious.     ED Results / Procedures / Treatments   Labs (all labs ordered are listed, but only abnormal results are displayed) Labs Reviewed  CBC WITH DIFFERENTIAL/PLATELET - Abnormal; Notable for the following components:      Result Value   WBC 18.8 (*)    Neutro Abs 16.9 (*)    Lymphs Abs 0.6 (*)    Monocytes Absolute 1.1 (*)    Abs Immature Granulocytes 0.12 (*)    All other components within normal limits  COMPREHENSIVE METABOLIC PANEL WITH GFR - Abnormal; Notable for the following components:   Sodium 125 (*)    Chloride 85 (*)    CO2 20 (*)    Glucose, Bld 197 (*)    Creatinine, Ser 1.29 (*)    Total Protein 8.5 (*)    AST 47 (*)    GFR, Estimated 42 (*)    Anion gap 20 (*)    All other components within normal limits  MAGNESIUM  - Abnormal; Notable for the following components:   Magnesium  1.4 (*)    All other components  within normal limits  LIPASE, BLOOD  URINALYSIS, W/ REFLEX TO CULTURE (INFECTION SUSPECTED)  CBG MONITORING, ED  I-STAT CG4 LACTIC ACID, ED    EKG EKG Interpretation Date/Time:  Thursday May 24 2024 22:32:36 EDT Ventricular Rate:  77 PR Interval:    QRS Duration:  100 QT Interval:  448 QTC Calculation: 508 R Axis:   -13  Text Interpretation: Sinus rhythm Nonspecific T wave abnormality Confirmed by Guadalupe Lee ([id number]) on 05/24/2024 10:49:27 PM  Radiology CT ABDOMEN PELVIS WO CONTRAST Result Date: 05/24/2024 EXAM: CT ABDOMEN AND PELVIS WITHOUT CONTRAST 05/24/2024 11:00:33 PM TECHNIQUE: CT of the abdomen and pelvis was performed without the administration of intravenous contrast. Multiplanar reformatted images are provided for review. Automated exposure control, iterative reconstruction, and/or weight based adjustment of the mA/kV was utilized to reduce the radiation dose to as low as reasonably achievable. COMPARISON: 04/29/2024 CLINICAL HISTORY: Abdominal pain, acute,  nonlocalized. No contrast. Patient arrives [facility] from home for headache and n/v x1 day. Hx of the same and sent to hospital for treatment in the past. Patient also started to complain of chest en route to hospital. History of dementia and at baseline according to family. FINDINGS: LOWER CHEST: Small hiatal hernia. LIVER: The liver is unremarkable. GALLBLADDER AND BILE DUCTS: Status post cholecystectomy. No biliary ductal dilatation. SPLEEN: No acute abnormality. PANCREAS: No acute abnormality. ADRENAL GLANDS: No acute abnormality. KIDNEYS, URETERS AND BLADDER: No stones in the kidneys or ureters. No hydronephrosis. Mild right perinephric fluid/stranding, chronic. Urinary bladder is unremarkable. GI AND BOWEL: Left colonic diverticulosis, without evidence of diverticulitis. The appendix is not completely visualized, possibly surgically absent. No bowel obstruction. No bowel wall thickening. PERITONEUM AND RETROPERITONEUM: No ascites. No free air. VASCULATURE: Atherosclerotic calcifications of the abdominal aorta and branch vessels. LYMPH NODES: No lymphadenopathy. REPRODUCTIVE ORGANS: Status post hysterectomy. BONES AND SOFT TISSUES: No acute osseous abnormality. No focal soft tissue abnormality. IMPRESSION: 1. No acute findings. 2. Left colonic diverticulosis, without evidence of diverticulitis. Electronically signed by: Zadie Herter MD 05/24/2024 11:05 PM EDT RP Workstation: [id number]    Medications Ordered in ED Medications  magnesium  sulfate IVPB 2 g 50 mL (has no administration in time range)  lactated ringers  bolus 1,000 mL (1,000 mLs Intravenous New Bag/Given 05/24/24 2214)  famotidine (PEPCID) IVPB 20 mg premix (0 mg Intravenous Stopped 05/24/24 2255)  metoCLOPramide  (REGLAN ) injection 10 mg (10 mg Intravenous Given 05/24/24 2213)  meclizine  (ANTIVERT ) tablet 25 mg (25 mg Oral Given 05/24/24 2224)    ED Course/ Medical Decision Making/ A&P  Cindy Huerta is a 78 y.o. female presents as  detailed above  Differential ddx: Vertigo, pancreatitis, GERD, hypoglycemia, dehydration, urinary tract infection  On arrival, patient afebrile hemodynamically stable no hypoxia or respiratory distress.Disoriented but has dementia. No focal deficit. No abdominal tenderness  ED Work-up: Please see details of labs and imaging listed above. Per chart review patient is prescribed meclizine  for dizziness.  Will administer Pepcid meclizine  Reglan  liter fluid bolus for symptom treatment. CT abdomen pelvis without contrast without acute intra-abdominal abnormality. EKG without evidence of ischemia.  QTc is 508.  Will administer 2 g of magnesium . CBC with leukocytosis 18.8.  Source unclear at this time. On reassessment patient more alert and interactive. She was unable to tolerate PO. 2g mag ordered  No other significant electrolyte changes from baseline.  Patient's work-up not complete at time of handoff. Handoff given to oncoming team Patient seen with supervising physician who agrees with plan.  Final Clinical Impression(s) / ED  Diagnoses Final diagnoses:  Nausea and vomiting, unspecified vomiting type    Angele Keller, DO PGY-3 Emergency Medicine    Angele Keller, DO 05/24/24 2327

## 2024-05-24 NOTE — ED Triage Notes (Signed)
 Patient arrives [facility] from home for headache and n/v x1 day. Hx of the same and sent to hospital for treatment in the past. Patient also started to complain of chest en route to hospital. History of dementia and at baseline according to family.

## 2024-05-25 DIAGNOSIS — Z7982 Long term (current) use of aspirin: Secondary | ICD-10-CM | POA: Diagnosis not present

## 2024-05-25 DIAGNOSIS — F0390 Unspecified dementia without behavioral disturbance: Secondary | ICD-10-CM | POA: Diagnosis present

## 2024-05-25 DIAGNOSIS — R42 Dizziness and giddiness: Secondary | ICD-10-CM | POA: Diagnosis not present

## 2024-05-25 DIAGNOSIS — Z8673 Personal history of transient ischemic attack (TIA), and cerebral infarction without residual deficits: Secondary | ICD-10-CM

## 2024-05-25 DIAGNOSIS — Z9012 Acquired absence of left breast and nipple: Secondary | ICD-10-CM | POA: Diagnosis not present

## 2024-05-25 DIAGNOSIS — Z8249 Family history of ischemic heart disease and other diseases of the circulatory system: Secondary | ICD-10-CM | POA: Diagnosis not present

## 2024-05-25 DIAGNOSIS — I160 Hypertensive urgency: Secondary | ICD-10-CM | POA: Diagnosis present

## 2024-05-25 DIAGNOSIS — E871 Hypo-osmolality and hyponatremia: Secondary | ICD-10-CM | POA: Diagnosis present

## 2024-05-25 DIAGNOSIS — D72829 Elevated white blood cell count, unspecified: Secondary | ICD-10-CM

## 2024-05-25 DIAGNOSIS — F32A Depression, unspecified: Secondary | ICD-10-CM | POA: Diagnosis present

## 2024-05-25 DIAGNOSIS — R112 Nausea with vomiting, unspecified: Secondary | ICD-10-CM | POA: Diagnosis present

## 2024-05-25 DIAGNOSIS — B349 Viral infection, unspecified: Secondary | ICD-10-CM | POA: Diagnosis present

## 2024-05-25 DIAGNOSIS — Z66 Do not resuscitate: Secondary | ICD-10-CM | POA: Diagnosis present

## 2024-05-25 DIAGNOSIS — N1831 Chronic kidney disease, stage 3a: Secondary | ICD-10-CM

## 2024-05-25 DIAGNOSIS — F418 Other specified anxiety disorders: Secondary | ICD-10-CM

## 2024-05-25 DIAGNOSIS — I5032 Chronic diastolic (congestive) heart failure: Secondary | ICD-10-CM | POA: Diagnosis present

## 2024-05-25 DIAGNOSIS — E785 Hyperlipidemia, unspecified: Secondary | ICD-10-CM | POA: Diagnosis present

## 2024-05-25 DIAGNOSIS — E039 Hypothyroidism, unspecified: Secondary | ICD-10-CM

## 2024-05-25 DIAGNOSIS — E872 Acidosis, unspecified: Secondary | ICD-10-CM | POA: Diagnosis present

## 2024-05-25 DIAGNOSIS — F0393 Unspecified dementia, unspecified severity, with mood disturbance: Secondary | ICD-10-CM | POA: Diagnosis present

## 2024-05-25 DIAGNOSIS — E0781 Sick-euthyroid syndrome: Secondary | ICD-10-CM | POA: Diagnosis present

## 2024-05-25 DIAGNOSIS — I130 Hypertensive heart and chronic kidney disease with heart failure and stage 1 through stage 4 chronic kidney disease, or unspecified chronic kidney disease: Secondary | ICD-10-CM | POA: Diagnosis present

## 2024-05-25 DIAGNOSIS — Z79890 Hormone replacement therapy (postmenopausal): Secondary | ICD-10-CM | POA: Diagnosis not present

## 2024-05-25 DIAGNOSIS — I2510 Atherosclerotic heart disease of native coronary artery without angina pectoris: Secondary | ICD-10-CM | POA: Diagnosis present

## 2024-05-25 DIAGNOSIS — E860 Dehydration: Secondary | ICD-10-CM | POA: Diagnosis present

## 2024-05-25 DIAGNOSIS — R9431 Abnormal electrocardiogram [ECG] [EKG]: Secondary | ICD-10-CM | POA: Diagnosis present

## 2024-05-25 DIAGNOSIS — Z79899 Other long term (current) drug therapy: Secondary | ICD-10-CM | POA: Diagnosis not present

## 2024-05-25 DIAGNOSIS — E878 Other disorders of electrolyte and fluid balance, not elsewhere classified: Secondary | ICD-10-CM | POA: Insufficient documentation

## 2024-05-25 DIAGNOSIS — R7989 Other specified abnormal findings of blood chemistry: Secondary | ICD-10-CM

## 2024-05-25 DIAGNOSIS — F0394 Unspecified dementia, unspecified severity, with anxiety: Secondary | ICD-10-CM | POA: Diagnosis present

## 2024-05-25 LAB — COMPREHENSIVE METABOLIC PANEL WITH GFR
ALT: 24 U/L (ref 0–44)
AST: 46 U/L — ABNORMAL HIGH (ref 15–41)
Albumin: 3.9 g/dL (ref 3.5–5.0)
Alkaline Phosphatase: 56 U/L (ref 38–126)
Anion gap: 16 — ABNORMAL HIGH (ref 5–15)
BUN: 6 mg/dL — ABNORMAL LOW (ref 8–23)
CO2: 23 mmol/L (ref 22–32)
Calcium: 9.2 mg/dL (ref 8.9–10.3)
Chloride: 86 mmol/L — ABNORMAL LOW (ref 98–111)
Creatinine, Ser: 0.98 mg/dL (ref 0.44–1.00)
GFR, Estimated: 58 mL/min — ABNORMAL LOW (ref >60)
Glucose, Bld: 132 mg/dL — ABNORMAL HIGH (ref 70–99)
Potassium: 2.8 mmol/L — ABNORMAL LOW (ref 3.5–5.1)
Sodium: 125 mmol/L — ABNORMAL LOW (ref 135–145)
Total Bilirubin: 0.8 mg/dL (ref 0.0–1.2)
Total Protein: 8.2 g/dL — ABNORMAL HIGH (ref 6.5–8.1)

## 2024-05-25 LAB — RAPID URINE DRUG SCREEN, HOSP PERFORMED
Amphetamines: NOT DETECTED
Barbiturates: NOT DETECTED
Benzodiazepines: NOT DETECTED
Cocaine: NOT DETECTED
Opiates: NOT DETECTED
Tetrahydrocannabinol: NOT DETECTED

## 2024-05-25 LAB — URINALYSIS, W/ REFLEX TO CULTURE (INFECTION SUSPECTED)
Bilirubin Urine: NEGATIVE
Glucose, UA: NEGATIVE mg/dL
Hgb urine dipstick: NEGATIVE
Ketones, ur: NEGATIVE mg/dL
Leukocytes,Ua: NEGATIVE
Nitrite: NEGATIVE
Protein, ur: 100 mg/dL — AB
Specific Gravity, Urine: 1.008 (ref 1.005–1.030)
pH: 8 (ref 5.0–8.0)

## 2024-05-25 LAB — CBC
HCT: 37.2 % (ref 36.0–46.0)
Hemoglobin: 13.2 g/dL (ref 12.0–15.0)
MCH: 30.7 pg (ref 26.0–34.0)
MCHC: 35.5 g/dL (ref 30.0–36.0)
MCV: 86.5 fL (ref 80.0–100.0)
Platelets: 244 10*3/uL (ref 150–400)
RBC: 4.3 MIL/uL (ref 3.87–5.11)
RDW: 12.9 % (ref 11.5–15.5)
WBC: 21.6 10*3/uL — ABNORMAL HIGH (ref 4.0–10.5)
nRBC: 0 % (ref 0.0–0.2)

## 2024-05-25 LAB — I-STAT CG4 LACTIC ACID, ED
Lactic Acid, Venous: 2.8 mmol/L (ref 0.5–1.9)
Lactic Acid, Venous: 3.9 mmol/L (ref 0.5–1.9)

## 2024-05-25 LAB — LACTIC ACID, PLASMA
Lactic Acid, Venous: 2 mmol/L (ref 0.5–1.9)
Lactic Acid, Venous: 1.9 mmol/L (ref 0.5–1.9)

## 2024-05-25 LAB — TSH: TSH: 6.258 u[IU]/mL — ABNORMAL HIGH (ref 0.350–4.500)

## 2024-05-25 LAB — TROPONIN I (HIGH SENSITIVITY)
Troponin I (High Sensitivity): 16 ng/L (ref <18)
Troponin I (High Sensitivity): 19 ng/L — ABNORMAL HIGH (ref <18)

## 2024-05-25 MED ORDER — SODIUM CHLORIDE 0.9 % IV SOLN
INTRAVENOUS | Status: DC
  Filled 2024-05-25: qty 1000

## 2024-05-25 MED ORDER — DEXTRAN 70-HYPROMELLOSE 0.1-0.3 % OP SOLN: 1.0000 [drp] | Freq: Every day | OPHTHALMIC | Status: DC | PRN

## 2024-05-25 MED ORDER — TRIMETHOBENZAMIDE HCL 100 MG/ML IM SOLN
200.0000 mg | Freq: Once | INTRAMUSCULAR | Status: AC
  Administered 2024-05-25: 200 mg via INTRAMUSCULAR
  Filled 2024-05-25: qty 2

## 2024-05-25 MED ORDER — FLUTICASONE PROPIONATE 50 MCG/ACT NA SUSP: 1.0000 | Freq: Every day | NASAL | Status: DC | PRN

## 2024-05-25 MED ORDER — ACETAMINOPHEN 650 MG RE SUPP: 650.0000 mg | Freq: Four times a day (QID) | RECTAL | Status: DC | PRN

## 2024-05-25 MED ORDER — SODIUM CHLORIDE 0.9 % IV SOLN
1.0000 g | Freq: Once | INTRAVENOUS | Status: AC
  Administered 2024-05-25: 1 g via INTRAVENOUS
  Filled 2024-05-25: qty 10

## 2024-05-25 MED ORDER — MECLIZINE HCL 12.5 MG PO TABS: 12.5000 mg | ORAL_TABLET | ORAL | Status: DC | PRN

## 2024-05-25 MED ORDER — FERROUS SULFATE 325 (65 FE) MG PO TABS
325.0000 mg | ORAL_TABLET | Freq: Every day | ORAL | Status: DC
  Administered 2024-05-25 – 2024-05-30 (×6): 325 mg via ORAL
  Filled 2024-05-25 (×6): qty 1

## 2024-05-25 MED ORDER — TETRAHYDROZOLINE HCL 0.05 % OP SOLN: 1.0000 [drp] | Freq: Every day | OPHTHALMIC | Status: DC | PRN

## 2024-05-25 MED ORDER — ASPIRIN 81 MG PO TBEC
81.0000 mg | DELAYED_RELEASE_TABLET | Freq: Every day | ORAL | Status: DC
  Administered 2024-05-25 – 2024-05-30 (×6): 81 mg via ORAL
  Filled 2024-05-25 (×6): qty 1

## 2024-05-25 MED ORDER — ACETAMINOPHEN 325 MG PO TABS
650.0000 mg | ORAL_TABLET | Freq: Four times a day (QID) | ORAL | Status: DC | PRN
Start: 2024-05-25 — End: 2024-05-31
  Administered 2024-05-30: 650 mg via ORAL
  Filled 2024-05-25: qty 2

## 2024-05-25 MED ORDER — SODIUM CHLORIDE 0.9 % IV SOLN: INTRAVENOUS | Status: DC

## 2024-05-25 MED ORDER — AMLODIPINE BESYLATE 10 MG PO TABS
10.0000 mg | ORAL_TABLET | Freq: Every day | ORAL | Status: DC
  Administered 2024-05-26 – 2024-05-31 (×6): 10 mg via ORAL
  Filled 2024-05-25 (×6): qty 1

## 2024-05-25 MED ORDER — CLONIDINE HCL 0.1 MG PO TABS
0.1000 mg | ORAL_TABLET | Freq: Every day | ORAL | Status: DC
  Administered 2024-05-26 – 2024-05-27 (×2): 0.1 mg via ORAL
  Filled 2024-05-25 (×2): qty 1

## 2024-05-25 MED ORDER — METOPROLOL SUCCINATE ER 50 MG PO TB24
50.0000 mg | ORAL_TABLET | Freq: Two times a day (BID) | ORAL | Status: DC
  Administered 2024-05-25 – 2024-05-31 (×12): 50 mg via ORAL
  Filled 2024-05-25 (×12): qty 1

## 2024-05-25 MED ORDER — PROCHLORPERAZINE EDISYLATE 10 MG/2ML IJ SOLN
5.0000 mg | Freq: Once | INTRAMUSCULAR | Status: AC
  Administered 2024-05-25: 5 mg via INTRAVENOUS
  Filled 2024-05-25: qty 2

## 2024-05-25 MED ORDER — PANTOPRAZOLE SODIUM 40 MG PO TBEC
40.0000 mg | DELAYED_RELEASE_TABLET | Freq: Every day | ORAL | Status: DC
  Administered 2024-05-26 – 2024-05-31 (×6): 40 mg via ORAL
  Filled 2024-05-25 (×6): qty 1

## 2024-05-25 MED ORDER — ENOXAPARIN SODIUM 40 MG/0.4ML IJ SOSY
40.0000 mg | PREFILLED_SYRINGE | INTRAMUSCULAR | Status: DC
  Administered 2024-05-25 – 2024-05-30 (×6): 40 mg via SUBCUTANEOUS
  Filled 2024-05-25 (×6): qty 0.4

## 2024-05-25 MED ORDER — ALBUTEROL SULFATE (2.5 MG/3ML) 0.083% IN NEBU: 2.5000 mg | INHALATION_SOLUTION | Freq: Four times a day (QID) | RESPIRATORY_TRACT | Status: DC | PRN

## 2024-05-25 MED ORDER — SODIUM CHLORIDE 0.9% FLUSH
3.0000 mL | Freq: Two times a day (BID) | INTRAVENOUS | Status: DC
  Administered 2024-05-25 – 2024-05-31 (×9): 3 mL via INTRAVENOUS

## 2024-05-25 MED ORDER — ATORVASTATIN CALCIUM 80 MG PO TABS
80.0000 mg | ORAL_TABLET | Freq: Every day | ORAL | Status: DC
  Administered 2024-05-26 – 2024-05-31 (×6): 80 mg via ORAL
  Filled 2024-05-25 (×5): qty 1
  Filled 2024-05-25: qty 2

## 2024-05-25 MED ORDER — SODIUM CHLORIDE 0.9 % IV BOLUS
500.0000 mL | Freq: Once | INTRAVENOUS | Status: AC
  Administered 2024-05-25: 500 mL via INTRAVENOUS

## 2024-05-25 MED ORDER — HYDRALAZINE HCL 50 MG PO TABS
50.0000 mg | ORAL_TABLET | Freq: Three times a day (TID) | ORAL | Status: DC
  Administered 2024-05-25 – 2024-05-28 (×9): 50 mg via ORAL
  Filled 2024-05-25 (×9): qty 1

## 2024-05-25 MED ORDER — MAGNESIUM OXIDE -MG SUPPLEMENT 400 (240 MG) MG PO TABS
400.0000 mg | ORAL_TABLET | Freq: Every day | ORAL | Status: DC
  Administered 2024-05-25 – 2024-05-30 (×6): 400 mg via ORAL
  Filled 2024-05-25 (×6): qty 1

## 2024-05-25 MED ORDER — ONDANSETRON HCL 4 MG/2ML IJ SOLN
4.0000 mg | Freq: Three times a day (TID) | INTRAMUSCULAR | Status: DC | PRN
  Administered 2024-05-25: 4 mg via INTRAVENOUS
  Filled 2024-05-25: qty 2

## 2024-05-25 MED ORDER — TRIMETHOBENZAMIDE HCL 100 MG/ML IM SOLN
200.0000 mg | Freq: Four times a day (QID) | INTRAMUSCULAR | Status: DC | PRN
  Administered 2024-05-25: 200 mg via INTRAMUSCULAR
  Filled 2024-05-25 (×2): qty 2

## 2024-05-25 MED ORDER — LEVOTHYROXINE SODIUM 50 MCG PO TABS
50.0000 ug | ORAL_TABLET | Freq: Every day | ORAL | Status: DC
  Administered 2024-05-27 – 2024-05-31 (×5): 50 ug via ORAL
  Filled 2024-05-25 (×7): qty 1

## 2024-05-25 MED ORDER — LORAZEPAM 0.5 MG PO TABS: 0.5000 mg | ORAL_TABLET | Freq: Every day | ORAL | Status: DC | PRN

## 2024-05-25 MED ORDER — HYDRALAZINE HCL 20 MG/ML IJ SOLN
10.0000 mg | INTRAMUSCULAR | Status: DC | PRN
  Filled 2024-05-25: qty 1

## 2024-05-25 NOTE — H&P (Signed)
 History and Physical    Patient: Cindy Huerta:[id number] DOB: [birth year] DOA: 05/24/2024 DOS: the patient was seen and examined on 05/25/2024 PCP: Tita Form, MD  Patient coming from: Home  Chief Complaint:  Chief Complaint  Patient presents with   Headache   Abdominal Pain   Chest Pain   HPI: Cindy Huerta is a 78 y.o. female with medical history significant of hypertension, hyperlipidemia, heart failure with preserved EF, depression, CAD, hypothyroidism dementia, GERD, presented with complaints of nausea and vomiting.   She began experiencing nausea and vomiting around 2:00 PM the previous day. She is unable to recall what she ate prior to the onset of symptoms, but mentioned it was something from the refrigerator.  She reports stomach pain that started concurrently with the vomiting. The pain is primarily located on the left side of her abdomen and occasionally radiates towards her chest. She describes the pain as constant and tender to touch.  No urinary symptoms such as burning, frequency, or discomfort during urination. No cough, shortness of breath, or fever.  Her gallbladder has been removed in the past. She does not consume alcohol  or smoke.  Over the phone her son adds additional information and states that she seems to get symptoms of nausea and vomiting with the weather changes.  Patient had been seen in the hospital on 5/25-5/28 with similar intractable nausea and vomiting, found to have leukocytosis, hypertensive urgency, AKI, and hyponatremia which resolved with supportive care.  Imaging studies have been done but found no acute abnormalities.  He felt that that hospitalization was likely due to something she ate, but she had not eaten anything out of the norm to his knowledge.  On admission to the emergency department patient was noted to be afebrile with respirations up to 27, blood pressures elevated up to 195/82, and O2 saturations maintained on room air.  Labs  significant for WBC 18.8, sodium 125, chloride 85, CO2 20, BUN 8, creatinine 1.29, anion gap 20,  magnesium  1.4, lactic acid 3.9-> 2.8.  Chest x-ray showed no acute abnormality.  Urinalysis showed many bacteria but was negative for leukocytes, nitrates, and 0-5 WBCs.  CT scan of the abdomen and pelvis noted a left-sided diverticulosis without acute signs of diverticulitis.  Patient has been given 1.5 L of lactated Ringer 's, magnesium  sulfate 2 g IV, Reglan , Compazine , Tigan , Pepcid, and Rocephin  IV.  Review of Systems: As mentioned in the history of present illness. All other systems reviewed and are negative. Past Medical History:  Diagnosis Date   Arthritis    Breast cancer (HCC)    Depression    GERD (gastroesophageal reflux disease)    HLD (hyperlipidemia)    Hypertension    Hypothyroidism    Past Surgical History:  Procedure Laterality Date   ABDOMINAL HYSTERECTOMY  1982   AXILLARY SENTINEL NODE BIOPSY Left 11/20/2021   Procedure: AXILLARY SENTINEL NODE BIOPSY;  Surgeon: Conrado Delay, DO;  Location: [facility] ORS;  Service: General;  Laterality: Left;   BREAST BIOPSY Left 09/23/2021   3:00 13cmfn venus marker, path pending   BREAST BIOPSY Left 09/23/2021   3:00 14 cmfn heart marker, path pending   CHOLECYSTECTOMY     ESOPHAGOGASTRODUODENOSCOPY (EGD) WITH PROPOFOL  N/A 10/08/2021   Procedure: ESOPHAGOGASTRODUODENOSCOPY (EGD) WITH PROPOFOL ;  Surgeon: Luke Salaam, MD;  Location: [facility] ENDOSCOPY;  Service: Gastroenterology;  Laterality: N/A;   HEMATOMA EVACUATION Left 11/21/2021   Procedure: EVACUATION HEMATOMA;  Surgeon: Conrado Delay, DO;  Location: [facility] ORS;  Service: General;  Laterality: Left;   IR FLUORO GUIDE CV LINE LEFT  08/17/2022   IR US  GUIDE VASC ACCESS LEFT  08/17/2022   IR US  GUIDE VASC ACCESS RIGHT  08/17/2022   left lumpectomy  2019   TOTAL MASTECTOMY Left 11/20/2021   Procedure: TOTAL MASTECTOMY;  Surgeon: Conrado Delay, DO;  Location: [facility] ORS;  Service: General;  Laterality:  Left;   Social History:  reports that she has never smoked. She has never used smokeless tobacco. She reports that she does not drink alcohol  and does not use drugs.  Allergies  Allergen Reactions   Latex Rash   Novocain [Procaine] Other (See Comments)    Unsure - told by DDS not to let anyone give it to her  Confusion    Zestril [Lisinopril] Cough   Benadryl  [Diphenhydramine ] Other (See Comments)    Jitteriness Agitation   Biaxin [Clarithromycin] Other (See Comments)    Confusion     Roxicodone [Oxycodone] Nausea And Vomiting and Anxiety    Family History  Problem Relation Age of Onset   Heart disease Mother    Cancer Paternal Grandmother     Prior to Admission medications   Medication Sig Start Date End Date Taking? Authorizing Provider  acetaminophen  (TYLENOL ) 500 MG tablet Take 500-1,000 mg by mouth daily as needed for moderate pain (pain score 4-6) or mild pain (pain score 1-3).   Yes [provider]  amLODipine  (NORVASC ) 10 MG tablet Take 1 tablet (10 mg total) by mouth daily. 08/25/22  Yes Amin, Ankit C, MD  ARTIFICIAL TEARS 0.1-0.3 % SOLN Place 1 drop into both eyes daily as needed for dry eyes. 07/15/22  Yes [provider]  aspirin  81 MG EC tablet Take 1 tablet (81 mg total) by mouth daily. RESTART 48HRS AFTER DISCHARGE Patient taking differently: Take 81 mg by mouth at bedtime. 11/25/21  Yes Sakai, Isami, DO  atorvastatin  (LIPITOR ) 80 MG tablet Take 80 mg by mouth daily. 09/21/21  Yes [provider]  Cholecalciferol  (VITAMIN D -3 PO) Take 1 capsule by mouth at bedtime.   Yes [provider]  cloNIDine  (CATAPRES ) 0.1 MG tablet Take 0.1 mg by mouth daily.   Yes [provider]  ferrous sulfate  325 (65 FE) MG EC tablet Take 325 mg by mouth at bedtime.   Yes [provider]  fluticasone  (FLONASE ) 50 MCG/ACT nasal spray Place 1 spray into both nostrils daily as needed for allergies or rhinitis.   Yes [provider]  hydrALAZINE  (APRESOLINE ) 50 MG tablet Take 50 mg by mouth 3 (three) times daily. 11/03/23  Yes [provider]  levothyroxine  (SYNTHROID ) 50 MCG tablet Take 50 mcg by mouth daily before breakfast. 02/29/24 02/28/25 Yes [provider]  LORazepam  (ATIVAN ) 0.5 MG tablet Take 0.5 mg by mouth daily as needed (shaking).   Yes [provider]  magnesium  oxide (MAG-OX) 400 MG tablet Take 1 tablet (400 mg total) by mouth daily. Patient taking differently: Take 400 mg by mouth at bedtime. 07/30/22  Yes Kraig Peru, MD  meclizine  (ANTIVERT ) 12.5 MG tablet Take 12.5 mg by mouth as needed for dizziness. 02/13/24  Yes [provider]  metoprolol  succinate (TOPROL -XL) 50 MG 24 hr tablet Take 50 mg by mouth 2 (two) times daily. 03/18/21  Yes [provider]  pantoprazole  (PROTONIX ) 40 MG tablet Take 1 tablet (40 mg total) by mouth daily. 10/10/21 05/25/24 Yes Sreenath, Sudheer B, MD  telmisartan  (MICARDIS ) 80 MG tablet Take 80 mg by mouth daily.  05/06/24  Yes [provider]  triamterene-hydrochlorothiazide (MAXZIDE-25) 37.5-25 MG tablet Take 1 tablet by mouth daily.   Yes [provider]  trolamine salicylate (ASPERCREME) 10 % cream Apply 1 Application topically as needed for muscle pain.   Yes [provider]  FLUoxetine  (PROZAC ) 10 MG capsule Take 1 capsule (10 mg total) by mouth daily. Patient not taking: Reported on 05/25/2024 09/16/23 04/30/24  Ree Candy, MD    Physical Exam: Vitals:   05/25/24 0515 05/25/24 0715 05/25/24 0739 05/25/24 0815  BP: (!) 171/78 (!) 198/82 (!) 183/83 (!) 177/74  Pulse: 86 77 86 76  Resp: (!) 27 18 19 18   Temp:   98.2 F (36.8 C)   TempSrc:   Oral   SpO2: 100% 100% 100% 100%  Weight:      Height:        Constitutional: Female who appears acutely ill but in no acute distress able to follow commands Eyes: PERRL, lids and conjunctivae normal ENMT: Mucous membranes are moist.   Dried yellow emesis present on the side of mouth. Neck: normal, supple,  Respiratory: clear to auscultation bilaterally, no wheezing, no crackles. Normal respiratory effort. No accessory muscle use.  Cardiovascular: Regular rate and rhythm, no murmurs / rubs / gallops. No extremity edema.   Abdomen: Mild left quadrant tenderness to palpation appreciated.  Bowel sounds positive.  Musculoskeletal: no clubbing / cyanosis. No joint deformity upper and lower extremities. Good ROM, no contractures. Normal muscle tone.  Skin: no rashes, lesions, ulcers. No induration Neurologic: CN 2-12 grossly intact. Sensation intact, DTR normal. Strength 5/5 in all 4.  Psychiatric: Normal judgment and insight. Alert and oriented x 3. Normal mood.   Data Reviewed:  EKG revealed sinus rhythm at 77 bpm with QTc 508.  Reviewed labs, imaging, and pertinent records as documented.    Assessment and Plan:  Nausea and vomiting Patient presents with complaints of nausea and vomiting and left lower quadrant abdominal pain.  Urinalysis showed many bacteria but no significant signs for infection.  Patient denied having any dysuria symptoms.  CT scan of the abdomen pelvis noted diverticulosis without signs of diverticulitis.  Patient had been bolused 1.5 L of IV fluids and given empiric antibiotics of Rocephin .  Question possibility of viral gastroenteritis.  No clear source of infection appreciated. - Admit to a medical telemetry bed - N.p.o. - Advance patient's diet as tolerated once able - IV fluids as tolerated - Tigan  IM as needed  Hypertensive urgency Blood pressures initially elevated up to 198/82.   - Continue clonidine , amlodipine , metoprolol  - Held triamterene hydrochlorothiazide and Maxalt. - Hydralazine  IV as needed for elevated blood pressures  Leukocytosis Lactic acidosis Acute.  WBC elevated 18.8 and lactic acid initially 3.9 with repeat check 2.8..  Similar elevation noted during previous  hospitalization with the same.  No clear cause of was found at that time.  Workup so far negative. - Recheck CBC and trend lactic acid levels.  Elevated troponin Patient did report the left-sided abdominal pain radiated up into her chest some.  High-sensitivity troponin 16-> 19.  EKG without significant ischemic changes. Continue to monitor-  Hyponatremia Hypochloremia Acute on chronic.  On admission sodium noted to be 125 with chloride 85.  This is similar to her prior., but suspect secondary to reports of nausea and vomiting. - Normal saline IV fluids  - Recheck levels in a.m.  Prolonged QT interval QTc noted to be 508 on admission.   - Avoid QT prolonging  medications - Correct electrolyte abnormalities - Recheck EKG  Hypomagnesemia Acute.  Magnesium  noted to be 1.4.  Patient had been given 2 g of magnesium  sulfate. - Continue to monitor replace as needed  Dementia - Delirium precautions  Chronic kidney disease stage IIIa Creatinine noted to be 1.29 which appears similar to prior - Continue to monitor  Anxiety and depression Patient reportedly ran out of this prescription. - Held Prozac  - Continue Ativan  as needed  Hypothyroidism TSH was 6.045 when checked on 5/25. - Check TSH  History of TIA - Continue aspirin  and statin    DVT prophylaxis: Lovenox   Advance Care Planning:   Code Status: Do not attempt resuscitation (DNR) PRE-ARREST INTERVENTIONS DESIRED   Consults: None  Family Communication: Updated over the phone  Severity of Illness: The appropriate patient status for this patient is OBSERVATION. Observation status is judged to be reasonable and necessary in order to provide the required intensity of service to ensure the patient's safety. The patient's presenting symptoms, physical exam findings, and initial radiographic and laboratory data in the context of their medical condition is felt to place them at decreased risk for further clinical deterioration.  Furthermore, it is anticipated that the patient will be medically stable for discharge from the hospital within 2 midnights of admission.   Author: Lena Qualia, MD 05/25/2024 10:31 AM  For on call review [email or web address].

## 2024-05-25 NOTE — ED Notes (Signed)
 CCMD contacted to place pt on cardiac monitoring

## 2024-05-25 NOTE — Plan of Care (Signed)

## 2024-05-25 NOTE — ED Provider Notes (Signed)
 Signout from [employer], DO.  Patient is a 78 year old female with a history of CKD, hypertension, GERD, TIA, dementia, hypothyroidism presents with complaints of nausea, vomiting, abdominal pain and chest pain.  Symptoms started earlier today.  Was admitted recently for intractable nausea and vomiting.  Patient reports symptoms feel similar.  Pain is described as burning.  Does endorse dizziness as room spinning.   Physical Exam  BP (!) 154/107 (BP Location: Right Arm)   Pulse 84   Temp 99.1 F (37.3 C) (Oral)   Resp 18   Ht 5' 6 (1.676 m)   Wt 69.5 kg   SpO2 100%   BMI 24.73 kg/m   Physical Exam Vitals and nursing note reviewed.  Constitutional:      General: She is not in acute distress.    Appearance: She is well-developed.  HENT:     Head: Normocephalic and atraumatic.   Eyes:     Conjunctiva/sclera: Conjunctivae normal.    Cardiovascular:     Rate and Rhythm: Normal rate and regular rhythm.     Heart sounds: No murmur heard. Pulmonary:     Effort: Pulmonary effort is normal. No respiratory distress.     Breath sounds: Normal breath sounds.     Comments: Reproducible left-sided chest wall tenderness Abdominal:     Palpations: Abdomen is soft.     Tenderness: There is abdominal tenderness.     Comments: Mild generalized abdominal tenderness worse epigastric and left upper quadrant, soft nondistended   Musculoskeletal:        General: No swelling.     Cervical back: Neck supple.   Skin:    General: Skin is warm and dry.     Capillary Refill: Capillary refill takes less than 2 seconds.   Neurological:     Mental Status: She is alert.     Comments: No nuchal rigidity, moves all extremities spontaneously, PERRL, alert and oriented  Psychiatric:        Mood and Affect: Mood normal.    Procedures  Procedures  ED Course / MDM    Medical Decision Making Amount and/or Complexity of Data Reviewed Labs: ordered. Radiology: ordered.  Risk Prescription drug  management.    CBC with leukocytosis of 18.8, CMP with hyponatremia of 125, seems to be near her baseline, creatinine of 1.29 appears to be downtrending from recent AKI, anion gap of 20, Mg of 1.4, lipase within normal limits, troponin without elevation, lactic of 3.9, UA with many bacteria, 0-5 WBCs, negative nitrites, negative leukocytes  CT with perinephric stranding, overall consistent with pyelonephritis.  Will give Rocephin .  Will additionally admit for intractable nausea and vomiting.  First troponin mildly elevated at 19 will continue to trend troponins.  Discussed patient with Dr. [facility], agreed for admission.    Felicie Horning, PA-C 05/25/24 0454    Maralee Senate, April, MD 05/25/24 [phone]

## 2024-05-26 DIAGNOSIS — R112 Nausea with vomiting, unspecified: Secondary | ICD-10-CM | POA: Diagnosis not present

## 2024-05-26 DIAGNOSIS — D72829 Elevated white blood cell count, unspecified: Secondary | ICD-10-CM | POA: Diagnosis not present

## 2024-05-26 DIAGNOSIS — E872 Acidosis, unspecified: Secondary | ICD-10-CM | POA: Diagnosis not present

## 2024-05-26 DIAGNOSIS — I160 Hypertensive urgency: Secondary | ICD-10-CM | POA: Diagnosis not present

## 2024-05-26 LAB — BASIC METABOLIC PANEL WITH GFR
Anion gap: 8 (ref 5–15)
Anion gap: 9 (ref 5–15)
BUN: 8 mg/dL (ref 8–23)
BUN: 8 mg/dL (ref 8–23)
CO2: 25 mmol/L (ref 22–32)
CO2: 22 mmol/L (ref 22–32)
Calcium: 9.2 mg/dL (ref 8.9–10.3)
Calcium: 9 mg/dL (ref 8.9–10.3)
Chloride: 96 mmol/L — ABNORMAL LOW (ref 98–111)
Chloride: 100 mmol/L (ref 98–111)
Creatinine, Ser: 1.01 mg/dL — ABNORMAL HIGH (ref 0.44–1.00)
Creatinine, Ser: 1 mg/dL (ref 0.44–1.00)
GFR, Estimated: 56 mL/min — ABNORMAL LOW (ref >60)
GFR, Estimated: 57 mL/min — ABNORMAL LOW (ref >60)
Glucose, Bld: 106 mg/dL — ABNORMAL HIGH (ref 70–99)
Glucose, Bld: 108 mg/dL — ABNORMAL HIGH (ref 70–99)
Potassium: 3.6 mmol/L (ref 3.5–5.1)
Potassium: 3.7 mmol/L (ref 3.5–5.1)
Sodium: 129 mmol/L — ABNORMAL LOW (ref 135–145)
Sodium: 131 mmol/L — ABNORMAL LOW (ref 135–145)

## 2024-05-26 LAB — CBC
HCT: 34.4 % — ABNORMAL LOW (ref 36.0–46.0)
Hemoglobin: 12.3 g/dL (ref 12.0–15.0)
MCH: 31.2 pg (ref 26.0–34.0)
MCHC: 35.8 g/dL (ref 30.0–36.0)
MCV: 87.3 fL (ref 80.0–100.0)
Platelets: 244 10*3/uL (ref 150–400)
RBC: 3.94 MIL/uL (ref 3.87–5.11)
RDW: 13.1 % (ref 11.5–15.5)
WBC: 15.5 10*3/uL — ABNORMAL HIGH (ref 4.0–10.5)
nRBC: 0 % (ref 0.0–0.2)

## 2024-05-26 LAB — T4, FREE: Free T4: 1.25 ng/dL — ABNORMAL HIGH (ref 0.61–1.12)

## 2024-05-26 MED ORDER — HYDRALAZINE HCL 20 MG/ML IJ SOLN
10.0000 mg | INTRAMUSCULAR | Status: DC | PRN
  Filled 2024-05-26: qty 1

## 2024-05-26 MED ORDER — LABETALOL HCL 5 MG/ML IV SOLN
10.0000 mg | INTRAVENOUS | Status: DC | PRN
  Administered 2024-05-26: 10 mg via INTRAVENOUS
  Filled 2024-05-26: qty 4

## 2024-05-26 MED ORDER — POTASSIUM CHLORIDE IN NACL 40-0.9 MEQ/L-% IV SOLN
INTRAVENOUS | Status: DC
  Filled 2024-05-26 (×2): qty 1000

## 2024-05-26 NOTE — Assessment & Plan Note (Signed)
-   Suspected reactive with possible hemoconcentration - Trend for now

## 2024-05-26 NOTE — Progress Notes (Signed)
 Progress Note    JAQUELINNE GLENDENING   FMW:[id number]  DOB: [birth year]  DOA: 05/24/2024     1 PCP: Glover [name], MD  Initial CC: N/V  Hospital Course: Ms. Hinojosa is an 78 yo female with PMH dementia, HTN, HLD, CHF, depression, GERD, CAD, hypothyroidism who presented with nausea and vomiting. There was also some reported abdominal pain but this seemed to have started after she developed nausea and vomiting. No urinary symptoms such as burning, frequency, or discomfort during urination. No cough, shortness of breath, or fever.   Her gallbladder has been removed in the past. She does not consume alcohol  or smoke.  Over the phone her son adds additional information and states that she seems to get symptoms of nausea and vomiting with the weather changes.  Patient had been seen in the hospital on 5/25-5/28 with similar intractable nausea and vomiting, found to have leukocytosis, hypertensive urgency, AKI, and hyponatremia which resolved with supportive care.  Imaging studies have been done but found no acute abnormalities.  He felt that that hospitalization was likely due to something she ate, but she had not eaten anything out of the norm to his knowledge.   CT abdomen/pelvis showed diverticulosis otherwise no acute findings.  CXR also unremarkable.  Urinalysis negative for signs of infection.  She was started on fluids and admitted for further workup.  Interval History:  Seems to be doing better this morning.  She was wanting to trial liquid diet.  Abdominal pain also seems improved.  Called and updated son this afternoon also.  Assessment and Plan: * Nausea & vomiting - Workup has been negative.  CT abdomen/pelvis reassuring with no acute findings.  Denies any diarrhea -Suspecting most likely a viral syndrome -Continue fluids and will trial clear liquids today and advance as able - Continue pain and nausea control  Hypertensive urgency - Possibly pain related and/or unable to take  medications - Continue PRN meds also  Lactic acidosis - Likely combination of volume depletion/dehydration with viral illness  Leukocytosis - Suspected reactive with possible hemoconcentration - Trend for now  Chronic diastolic CHF (congestive heart failure) (HCC) - No signs/symptoms of exacerbation - Last echo 08/09/2019 4: EF 60 to 65%, no RWMA, indeterminate diastolic parameters -Monitor while on fluids  Elevated troponin - No chest pains and flat trend  Hyponatremia - Sodium 125 on admission. -Suspected hypovolemic hyponatremia from nausea/vomiting - Continue fluids - Trend BMP  QT prolongation - Monitoring electrolytes  Hypomagnesemia - Replete as needed  Dementia (HCC) - At baseline - Continue delirium precautions  CKD (chronic kidney disease), stage IIIa - patient has history of CKD3a. Baseline creat ~ 1.2, eGFR~ 50-55   Depression with anxiety - No home meds noted  Hypothyroidism - Likely sick euthyroid -TSH elevated, 6.258.  Free T4 also mildly elevated, 1.25 -Continue Synthroid  - Could consider repeating thyroid  studies in about 4 weeks  History of TIA (transient ischemic attack) - Continue aspirin , statin    Old records reviewed in assessment of this patient  Antimicrobials:   DVT prophylaxis:  enoxaparin  (LOVENOX ) injection 40 mg Start: 05/25/24 2200   Code Status:   Code Status: Do not attempt resuscitation (DNR) PRE-ARREST INTERVENTIONS DESIRED  Mobility Assessment (Last 72 Hours)     Mobility Assessment     Row Name 05/26/24 0830 05/25/24 2225 05/25/24 1300       Does patient have an order for bedrest or is patient medically unstable No - Continue assessment No - Continue assessment No -  Continue assessment     What is the highest level of mobility based on the progressive mobility assessment? Level 1 (Bedfast) - Unable to balance while sitting on edge of bed Level 1 (Bedfast) - Unable to balance while sitting on edge of bed Level 1  (Bedfast) - Unable to balance while sitting on edge of bed        Barriers to discharge: none Disposition Plan:  Home  HH orders placed: n/a Status is: Inpt  Objective: Blood pressure (!) 172/81, pulse 79, temperature 98.1 F (36.7 C), temperature source Oral, resp. rate 17, height 5' 6 (1.676 m), weight 73.3 kg, SpO2 100%.  Examination:  Physical Exam Constitutional:      Appearance: Normal appearance.  HENT:     Head: Normocephalic and atraumatic.     Mouth/Throat:     Mouth: Mucous membranes are moist.   Eyes:     Extraocular Movements: Extraocular movements intact.    Cardiovascular:     Rate and Rhythm: Normal rate and regular rhythm.  Pulmonary:     Effort: Pulmonary effort is normal. No respiratory distress.     Breath sounds: Normal breath sounds. No wheezing.  Abdominal:     General: Bowel sounds are normal. There is no distension.     Palpations: Abdomen is soft.     Tenderness: There is no abdominal tenderness.   Musculoskeletal:        General: Normal range of motion.     Cervical back: Normal range of motion and neck supple.   Skin:    General: Skin is warm and dry.   Neurological:     General: No focal deficit present.     Mental Status: She is alert.     Comments: Underlying dementia appreciated   Psychiatric:        Mood and Affect: Mood normal.      Consultants:    Procedures:    Data Reviewed: Results for orders placed or performed during the hospital encounter of 05/24/24 (from the past 24 hours)  Comprehensive metabolic panel     Status: Abnormal   Collection Time: 05/25/24  3:39 PM  Result Value Ref Range   Sodium 125 (L) 135 - 145 mmol/L   Potassium 2.8 (L) 3.5 - 5.1 mmol/L   Chloride 86 (L) 98 - 111 mmol/L   CO2 23 22 - 32 mmol/L   Glucose, Bld 132 (H) 70 - 99 mg/dL   BUN 6 (L) 8 - 23 mg/dL   Creatinine, Ser 9.01 0.44 - 1.00 mg/dL   Calcium  9.2 8.9 - 10.3 mg/dL   Total Protein 8.2 (H) 6.5 - 8.1 g/dL   Albumin 3.9 3.5 - 5.0  g/dL   AST 46 (H) 15 - 41 U/L   ALT 24 0 - 44 U/L   Alkaline Phosphatase 56 38 - 126 U/L   Total Bilirubin 0.8 0.0 - 1.2 mg/dL   GFR, Estimated 58 (L) >60 mL/min   Anion gap 16 (H) 5 - 15  CBC     Status: Abnormal   Collection Time: 05/25/24  3:39 PM  Result Value Ref Range   WBC 21.6 (H) 4.0 - 10.5 K/uL   RBC 4.30 3.87 - 5.11 MIL/uL   Hemoglobin 13.2 12.0 - 15.0 g/dL   HCT 62.7 63.9 - 53.9 %   MCV 86.5 80.0 - 100.0 fL   MCH 30.7 26.0 - 34.0 pg   MCHC 35.5 30.0 - 36.0 g/dL   RDW 87.0 88.4 -  15.5 %   Platelets 244 150 - 400 K/uL   nRBC 0.0 0.0 - 0.2 %  Lactic acid, plasma     Status: Abnormal   Collection Time: 05/25/24  3:39 PM  Result Value Ref Range   Lactic Acid, Venous 2.0 (HH) 0.5 - 1.9 mmol/L  TSH     Status: Abnormal   Collection Time: 05/25/24  3:39 PM  Result Value Ref Range   TSH 6.258 (H) 0.350 - 4.500 uIU/mL  Lactic acid, plasma     Status: None   Collection Time: 05/25/24  6:38 PM  Result Value Ref Range   Lactic Acid, Venous 1.9 0.5 - 1.9 mmol/L  CBC     Status: Abnormal   Collection Time: 05/26/24  6:15 AM  Result Value Ref Range   WBC 15.5 (H) 4.0 - 10.5 K/uL   RBC 3.94 3.87 - 5.11 MIL/uL   Hemoglobin 12.3 12.0 - 15.0 g/dL   HCT 65.5 (L) 63.9 - 53.9 %   MCV 87.3 80.0 - 100.0 fL   MCH 31.2 26.0 - 34.0 pg   MCHC 35.8 30.0 - 36.0 g/dL   RDW 86.8 88.4 - 84.4 %   Platelets 244 150 - 400 K/uL   nRBC 0.0 0.0 - 0.2 %  Basic metabolic panel     Status: Abnormal   Collection Time: 05/26/24  6:15 AM  Result Value Ref Range   Sodium 129 (L) 135 - 145 mmol/L   Potassium 3.6 3.5 - 5.1 mmol/L   Chloride 96 (L) 98 - 111 mmol/L   CO2 25 22 - 32 mmol/L   Glucose, Bld 106 (H) 70 - 99 mg/dL   BUN 8 8 - 23 mg/dL   Creatinine, Ser 8.98 (H) 0.44 - 1.00 mg/dL   Calcium  9.2 8.9 - 10.3 mg/dL   GFR, Estimated 56 (L) >60 mL/min   Anion gap 8 5 - 15  T4, free     Status: Abnormal   Collection Time: 05/26/24  6:15 AM  Result Value Ref Range   Free T4 1.25 (H) 0.61 -  1.12 ng/dL    I have reviewed pertinent nursing notes, vitals, labs, and images as necessary. I have ordered labwork to follow up on as indicated.  I have reviewed the last notes from staff over past 24 hours. I have discussed patient's care plan and test results with nursing staff, CM/SW, and other staff as appropriate.  Time spent: Greater than 50% of the 55 minute visit was spent in counseling/coordination of care for the patient as laid out in the A&P.   LOS: 1 day   Alm Apo, MD [facility] 05/26/2024, 12:17 PM

## 2024-05-26 NOTE — Assessment & Plan Note (Signed)
-   Possibly pain related and/or unable to take medications - Continue PRN meds also

## 2024-05-26 NOTE — Assessment & Plan Note (Signed)
 -  No home meds noted ?

## 2024-05-26 NOTE — Assessment & Plan Note (Signed)
-   Monitoring electrolytes

## 2024-05-26 NOTE — Assessment & Plan Note (Signed)
-   Likely sick euthyroid -TSH elevated, 6.258.  Free T4 also mildly elevated, 1.25 -Continue Synthroid  - Could consider repeating thyroid  studies in about 4 weeks

## 2024-05-26 NOTE — Assessment & Plan Note (Signed)
-   Sodium 125 on admission. -Suspected hypovolemic hyponatremia from nausea/vomiting - Improved with fluids - Continue diet

## 2024-05-26 NOTE — Assessment & Plan Note (Signed)
-

## 2024-05-26 NOTE — Assessment & Plan Note (Signed)
-   Workup has been negative.  CT abdomen/pelvis reassuring with no acute findings.  Denies any diarrhea -Suspecting most likely a viral syndrome; sounds like also some dizziness may be contributing to her vomiting -Advance diet today - Continue pain and nausea control

## 2024-05-26 NOTE — Assessment & Plan Note (Signed)
-   No signs/symptoms of exacerbation - Last echo 08/09/2019 4: EF 60 to 65%, no RWMA, indeterminate diastolic parameters -Monitor while on fluids

## 2024-05-26 NOTE — Hospital Course (Signed)
 Ms. Mcdowell is an 78 yo female with PMH dementia, HTN, HLD, CHF, depression, GERD, CAD, hypothyroidism who presented with nausea and vomiting. There was also some reported abdominal pain but this seemed to have started after she developed nausea and vomiting. No urinary symptoms such as burning, frequency, or discomfort during urination. No cough, shortness of breath, or fever.   Her gallbladder has been removed in the past. She does not consume alcohol  or smoke.  Over the phone her son adds additional information and states that she seems to get symptoms of nausea and vomiting with the weather changes.  Patient had been seen in the hospital on 5/25-5/28 with similar intractable nausea and vomiting, found to have leukocytosis, hypertensive urgency, AKI, and hyponatremia which resolved with supportive care.  Imaging studies have been done but found no acute abnormalities.  He felt that that hospitalization was likely due to something she ate, but she had not eaten anything out of the norm to his knowledge.   CT abdomen/pelvis showed diverticulosis otherwise no acute findings.  CXR also unremarkable.  Urinalysis negative for signs of infection.  She was started on fluids and admitted for further workup.

## 2024-05-26 NOTE — Assessment & Plan Note (Signed)
-   No chest pains and flat trend

## 2024-05-26 NOTE — Assessment & Plan Note (Signed)
 Continue aspirin, statin.

## 2024-05-26 NOTE — Assessment & Plan Note (Signed)
-   patient has history of CKD3a. Baseline creat ~ 1.2, eGFR~ 50-55

## 2024-05-26 NOTE — Assessment & Plan Note (Signed)
-   Likely combination of volume depletion/dehydration with viral illness

## 2024-05-26 NOTE — Assessment & Plan Note (Signed)
-   At baseline - Continue delirium precautions No agitation

## 2024-05-27 DIAGNOSIS — D72829 Elevated white blood cell count, unspecified: Secondary | ICD-10-CM | POA: Diagnosis not present

## 2024-05-27 DIAGNOSIS — R112 Nausea with vomiting, unspecified: Secondary | ICD-10-CM | POA: Diagnosis not present

## 2024-05-27 DIAGNOSIS — E871 Hypo-osmolality and hyponatremia: Secondary | ICD-10-CM | POA: Diagnosis not present

## 2024-05-27 LAB — CBC WITH DIFFERENTIAL/PLATELET
Abs Immature Granulocytes: 0.08 10*3/uL — ABNORMAL HIGH (ref 0.00–0.07)
Basophils Absolute: 0.1 10*3/uL (ref 0.0–0.1)
Basophils Relative: 1 %
Eosinophils Absolute: 0.2 10*3/uL (ref 0.0–0.5)
Eosinophils Relative: 2 %
HCT: 32.7 % — ABNORMAL LOW (ref 36.0–46.0)
Hemoglobin: 11.3 g/dL — ABNORMAL LOW (ref 12.0–15.0)
Immature Granulocytes: 1 %
Lymphocytes Relative: 19 %
Lymphs Abs: 2.4 10*3/uL (ref 0.7–4.0)
MCH: 31.1 pg (ref 26.0–34.0)
MCHC: 34.6 g/dL (ref 30.0–36.0)
MCV: 90.1 fL (ref 80.0–100.0)
Monocytes Absolute: 1.6 10*3/uL — ABNORMAL HIGH (ref 0.1–1.0)
Monocytes Relative: 13 %
Neutro Abs: 8.1 10*3/uL — ABNORMAL HIGH (ref 1.7–7.7)
Neutrophils Relative %: 64 %
Platelets: 210 10*3/uL (ref 150–400)
RBC: 3.63 MIL/uL — ABNORMAL LOW (ref 3.87–5.11)
RDW: 13.2 % (ref 11.5–15.5)
WBC: 12.5 10*3/uL — ABNORMAL HIGH (ref 4.0–10.5)
nRBC: 0 % (ref 0.0–0.2)

## 2024-05-27 LAB — BASIC METABOLIC PANEL WITH GFR
Anion gap: 5 (ref 5–15)
BUN: 7 mg/dL — ABNORMAL LOW (ref 8–23)
CO2: 20 mmol/L — ABNORMAL LOW (ref 22–32)
Calcium: 8.4 mg/dL — ABNORMAL LOW (ref 8.9–10.3)
Chloride: 105 mmol/L (ref 98–111)
Creatinine, Ser: 1.07 mg/dL — ABNORMAL HIGH (ref 0.44–1.00)
GFR, Estimated: 53 mL/min — ABNORMAL LOW (ref >60)
Glucose, Bld: 91 mg/dL (ref 70–99)
Potassium: 4 mmol/L (ref 3.5–5.1)
Sodium: 130 mmol/L — ABNORMAL LOW (ref 135–145)

## 2024-05-27 LAB — MAGNESIUM: Magnesium: 1.8 mg/dL (ref 1.7–2.4)

## 2024-05-27 MED ORDER — LORATADINE 10 MG PO TABS
10.0000 mg | ORAL_TABLET | Freq: Every day | ORAL | Status: DC
  Administered 2024-05-27 – 2024-05-31 (×5): 10 mg via ORAL
  Filled 2024-05-27 (×5): qty 1

## 2024-05-27 NOTE — Progress Notes (Signed)
 Progress Note    Cindy Huerta   FMW:[id number]  DOB: [birth year]  DOA: 05/24/2024     2 PCP: Cindy [name], MD  Initial CC: N/V  Hospital Course: Cindy Huerta is an 78 yo female with PMH dementia, HTN, HLD, CHF, depression, GERD, CAD, hypothyroidism who presented with nausea and vomiting. There was also some reported abdominal pain but this seemed to have started after she developed nausea and vomiting. No urinary symptoms such as burning, frequency, or discomfort during urination. No cough, shortness of breath, or fever.   Her gallbladder has been removed in the past. She does not consume alcohol  or smoke.  Over the phone her son adds additional information and states that she seems to get symptoms of nausea and vomiting with the weather changes.  Patient had been seen in the hospital on 5/25-5/28 with similar intractable nausea and vomiting, found to have leukocytosis, hypertensive urgency, AKI, and hyponatremia which resolved with supportive care.  Imaging studies have been done but found no acute abnormalities.  He felt that that hospitalization was likely due to something she ate, but she had not eaten anything out of the norm to his knowledge.   CT abdomen/pelvis showed diverticulosis otherwise no acute findings.  CXR also unremarkable.  Urinalysis negative for signs of infection.  She was started on fluids and admitted for further workup.  Interval History:  Tolerated clear liquids yesterday.  Advancing diet further today.  Seems to still have some positional dizziness which may be contributing to her nausea and vomiting.  Assessment and Plan: * Nausea & vomiting - Workup has been negative.  CT abdomen/pelvis reassuring with no acute findings.  Denies any diarrhea -Suspecting most likely a viral syndrome; sounds like also some dizziness may be contributing to her vomiting -Advance diet today - Continue pain and nausea control  Hypertensive urgency-resolved as of 05/27/2024 -  Possibly pain related and/or unable to take medications - Continue PRN meds also  Leukocytosis - Suspected reactive with possible hemoconcentration - Trend for now  Chronic diastolic CHF (congestive heart failure) (HCC) - No signs/symptoms of exacerbation - Last echo 08/09/2019 4: EF 60 to 65%, no RWMA, indeterminate diastolic parameters -Monitor while on fluids  Hyponatremia - Sodium 125 on admission. -Suspected hypovolemic hyponatremia from nausea/vomiting - Continue fluids - Trend BMP  Lactic acidosis-resolved as of 05/27/2024 - Likely combination of volume depletion/dehydration with viral illness  Elevated troponin - No chest pains and flat trend  QT prolongation - Monitoring electrolytes  Hypomagnesemia - Replete as needed  Dementia (HCC) - At baseline - Continue delirium precautions  CKD (chronic kidney disease), stage IIIa - patient has history of CKD3a. Baseline creat ~ 1.2, eGFR~ 50-55   Depression with anxiety - No home meds noted  Hypothyroidism - Likely sick euthyroid -TSH elevated, 6.258.  Free T4 also mildly elevated, 1.25 -Continue Synthroid  - Could consider repeating thyroid  studies in about 4 weeks  History of TIA (transient ischemic attack) - Continue aspirin , statin    Old records reviewed in assessment of this patient  Antimicrobials:   DVT prophylaxis:  enoxaparin  (LOVENOX ) injection 40 mg Start: 05/25/24 2200   Code Status:   Code Status: Do not attempt resuscitation (DNR) PRE-ARREST INTERVENTIONS DESIRED  Mobility Assessment (Last 72 Hours)     Mobility Assessment     Row Name 05/26/24 2351 05/26/24 0830 05/25/24 2225 05/25/24 1300     Does patient have an order for bedrest or is patient medically unstable No - Continue assessment  No - Continue assessment No - Continue assessment No - Continue assessment    What is the highest level of mobility based on the progressive mobility assessment? Level 1 (Bedfast) - Unable to balance  while sitting on edge of bed Level 1 (Bedfast) - Unable to balance while sitting on edge of bed Level 1 (Bedfast) - Unable to balance while sitting on edge of bed Level 1 (Bedfast) - Unable to balance while sitting on edge of bed       Barriers to discharge: none Disposition Plan:  Home  HH orders placed: n/a Status is: Inpt  Objective: Blood pressure (!) 150/62, pulse 66, temperature 98.4 F (36.9 C), temperature source Oral, resp. rate 18, height 5' 6 (1.676 m), weight 73.3 kg, SpO2 97%.  Examination:  Physical Exam Constitutional:      Appearance: Normal appearance.  HENT:     Head: Normocephalic and atraumatic.     Mouth/Throat:     Mouth: Mucous membranes are moist.   Eyes:     Extraocular Movements: Extraocular movements intact.    Cardiovascular:     Rate and Rhythm: Normal rate and regular rhythm.  Pulmonary:     Effort: Pulmonary effort is normal. No respiratory distress.     Breath sounds: Normal breath sounds. No wheezing.  Abdominal:     General: Bowel sounds are normal. There is no distension.     Palpations: Abdomen is soft.     Tenderness: There is no abdominal tenderness.   Musculoskeletal:        General: Normal range of motion.     Cervical back: Normal range of motion and neck supple.   Skin:    General: Skin is warm and dry.   Neurological:     General: No focal deficit present.     Mental Status: She is alert.     Comments: Underlying dementia appreciated   Psychiatric:        Mood and Affect: Mood normal.      Consultants:    Procedures:    Data Reviewed: Results for orders placed or performed during the hospital encounter of 05/24/24 (from the past 24 hours)  Basic metabolic panel     Status: Abnormal   Collection Time: 05/26/24  5:29 PM  Result Value Ref Range   Sodium 131 (L) 135 - 145 mmol/L   Potassium 3.7 3.5 - 5.1 mmol/L   Chloride 100 98 - 111 mmol/L   CO2 22 22 - 32 mmol/L   Glucose, Bld 108 (H) 70 - 99 mg/dL   BUN 8 8  - 23 mg/dL   Creatinine, Ser 8.99 0.44 - 1.00 mg/dL   Calcium  9.0 8.9 - 10.3 mg/dL   GFR, Estimated 57 (L) >60 mL/min   Anion gap 9 5 - 15  Basic metabolic panel with GFR     Status: Abnormal   Collection Time: 05/27/24  4:41 AM  Result Value Ref Range   Sodium 130 (L) 135 - 145 mmol/L   Potassium 4.0 3.5 - 5.1 mmol/L   Chloride 105 98 - 111 mmol/L   CO2 20 (L) 22 - 32 mmol/L   Glucose, Bld 91 70 - 99 mg/dL   BUN 7 (L) 8 - 23 mg/dL   Creatinine, Ser 8.92 (H) 0.44 - 1.00 mg/dL   Calcium  8.4 (L) 8.9 - 10.3 mg/dL   GFR, Estimated 53 (L) >60 mL/min   Anion gap 5 5 - 15  CBC with Differential/Platelet  Status: Abnormal   Collection Time: 05/27/24  4:41 AM  Result Value Ref Range   WBC 12.5 (H) 4.0 - 10.5 K/uL   RBC 3.63 (L) 3.87 - 5.11 MIL/uL   Hemoglobin 11.3 (L) 12.0 - 15.0 g/dL   HCT 67.2 (L) 63.9 - 53.9 %   MCV 90.1 80.0 - 100.0 fL   MCH 31.1 26.0 - 34.0 pg   MCHC 34.6 30.0 - 36.0 g/dL   RDW 86.7 88.4 - 84.4 %   Platelets 210 150 - 400 K/uL   nRBC 0.0 0.0 - 0.2 %   Neutrophils Relative % 64 %   Neutro Abs 8.1 (H) 1.7 - 7.7 K/uL   Lymphocytes Relative 19 %   Lymphs Abs 2.4 0.7 - 4.0 K/uL   Monocytes Relative 13 %   Monocytes Absolute 1.6 (H) 0.1 - 1.0 K/uL   Eosinophils Relative 2 %   Eosinophils Absolute 0.2 0.0 - 0.5 K/uL   Basophils Relative 1 %   Basophils Absolute 0.1 0.0 - 0.1 K/uL   Immature Granulocytes 1 %   Abs Immature Granulocytes 0.08 (H) 0.00 - 0.07 K/uL  Magnesium      Status: None   Collection Time: 05/27/24  4:41 AM  Result Value Ref Range   Magnesium  1.8 1.7 - 2.4 mg/dL    I have reviewed pertinent nursing notes, vitals, labs, and images as necessary. I have ordered labwork to follow up on as indicated.  I have reviewed the last notes from staff over past 24 hours. I have discussed patient's care plan and test results with nursing staff, CM/SW, and other staff as appropriate.  Time spent: Greater than 50% of the 55 minute visit was spent in  counseling/coordination of care for the patient as laid out in the A&P.   LOS: 2 days   Alm Apo, MD [facility] 05/27/2024, 12:37 PM

## 2024-05-27 NOTE — Plan of Care (Signed)
  Problem: Education: Goal: Knowledge of General Education information will improve Description: Including pain rating scale, medication(s)/side effects and non-pharmacologic comfort measures Outcome: Progressing   Problem: Clinical Measurements: Goal: Respiratory complications will improve Outcome: Progressing   Problem: Clinical Measurements: Goal: Cardiovascular complication will be avoided Outcome: Progressing   Problem: Nutrition: Goal: Adequate nutrition will be maintained Outcome: Progressing   Problem: Coping: Goal: Level of anxiety will decrease Outcome: Progressing   Problem: Elimination: Goal: Will not experience complications related to urinary retention Outcome: Progressing   Problem: Safety: Goal: Ability to remain free from injury will improve Outcome: Progressing   Problem: Skin Integrity: Goal: Risk for impaired skin integrity will decrease Outcome: Progressing

## 2024-05-28 ENCOUNTER — Inpatient Hospital Stay (HOSPITAL_COMMUNITY)

## 2024-05-28 DIAGNOSIS — R42 Dizziness and giddiness: Secondary | ICD-10-CM

## 2024-05-28 DIAGNOSIS — D72829 Elevated white blood cell count, unspecified: Secondary | ICD-10-CM | POA: Diagnosis not present

## 2024-05-28 DIAGNOSIS — E871 Hypo-osmolality and hyponatremia: Secondary | ICD-10-CM | POA: Diagnosis not present

## 2024-05-28 DIAGNOSIS — R112 Nausea with vomiting, unspecified: Secondary | ICD-10-CM | POA: Diagnosis not present

## 2024-05-28 LAB — CBC WITH DIFFERENTIAL/PLATELET
Abs Immature Granulocytes: 0.1 10*3/uL — ABNORMAL HIGH (ref 0.00–0.07)
Basophils Absolute: 0.1 10*3/uL (ref 0.0–0.1)
Basophils Relative: 0 %
Eosinophils Absolute: 0.4 10*3/uL (ref 0.0–0.5)
Eosinophils Relative: 3 %
HCT: 34.7 % — ABNORMAL LOW (ref 36.0–46.0)
Hemoglobin: 11.6 g/dL — ABNORMAL LOW (ref 12.0–15.0)
Immature Granulocytes: 1 %
Lymphocytes Relative: 15 %
Lymphs Abs: 2.1 10*3/uL (ref 0.7–4.0)
MCH: 30.7 pg (ref 26.0–34.0)
MCHC: 33.4 g/dL (ref 30.0–36.0)
MCV: 91.8 fL (ref 80.0–100.0)
Monocytes Absolute: 1.4 10*3/uL — ABNORMAL HIGH (ref 0.1–1.0)
Monocytes Relative: 10 %
Neutro Abs: 10.6 10*3/uL — ABNORMAL HIGH (ref 1.7–7.7)
Neutrophils Relative %: 71 %
Platelets: 203 10*3/uL (ref 150–400)
RBC: 3.78 MIL/uL — ABNORMAL LOW (ref 3.87–5.11)
RDW: 13 % (ref 11.5–15.5)
WBC: 14.7 10*3/uL — ABNORMAL HIGH (ref 4.0–10.5)
nRBC: 0 % (ref 0.0–0.2)

## 2024-05-28 LAB — MAGNESIUM: Magnesium: 1.5 mg/dL — ABNORMAL LOW (ref 1.7–2.4)

## 2024-05-28 LAB — BASIC METABOLIC PANEL WITH GFR
Anion gap: 9 (ref 5–15)
BUN: 8 mg/dL (ref 8–23)
CO2: 19 mmol/L — ABNORMAL LOW (ref 22–32)
Calcium: 8.9 mg/dL (ref 8.9–10.3)
Chloride: 103 mmol/L (ref 98–111)
Creatinine, Ser: 0.94 mg/dL (ref 0.44–1.00)
GFR, Estimated: >60 mL/min (ref >60)
Glucose, Bld: 96 mg/dL (ref 70–99)
Potassium: 3.6 mmol/L (ref 3.5–5.1)
Sodium: 131 mmol/L — ABNORMAL LOW (ref 135–145)

## 2024-05-28 LAB — T3: T3, Total: 118 ng/dL (ref 71–180)

## 2024-05-28 MED ORDER — MAGNESIUM SULFATE 2 GM/50ML IV SOLN
2.0000 g | Freq: Once | INTRAVENOUS | Status: AC
  Administered 2024-05-28: 2 g via INTRAVENOUS
  Filled 2024-05-28: qty 50

## 2024-05-28 MED ORDER — CLONIDINE HCL 0.1 MG PO TABS
0.2000 mg | ORAL_TABLET | Freq: Every day | ORAL | Status: DC
  Administered 2024-05-28: 0.2 mg via ORAL
  Filled 2024-05-28: qty 2

## 2024-05-28 MED ORDER — MECLIZINE HCL 12.5 MG PO TABS: 12.5000 mg | ORAL_TABLET | Freq: Two times a day (BID) | ORAL | Status: DC | PRN

## 2024-05-28 MED ORDER — CLONIDINE HCL 0.1 MG PO TABS
0.1000 mg | ORAL_TABLET | Freq: Every day | ORAL | Status: DC
  Administered 2024-05-29 – 2024-05-31 (×3): 0.1 mg via ORAL
  Filled 2024-05-28 (×3): qty 1

## 2024-05-28 MED ORDER — HYDRALAZINE HCL 25 MG PO TABS: 75.0000 mg | ORAL_TABLET | Freq: Three times a day (TID) | ORAL | 2 refills | Status: DC

## 2024-05-28 MED ORDER — HYDRALAZINE HCL 50 MG PO TABS
75.0000 mg | ORAL_TABLET | Freq: Three times a day (TID) | ORAL | Status: DC
  Administered 2024-05-28 – 2024-05-31 (×9): 75 mg via ORAL
  Filled 2024-05-28 (×9): qty 1

## 2024-05-28 NOTE — Evaluation (Signed)
 Occupational Therapy Evaluation Patient Details Name: Cindy Huerta MRN: [id number] DOB: 02/14/1946 Today's Date: 05/28/2024   History of Present Illness   Pt is a 78 y/o female admitted 05/24/24  with nausea and vomiting, also reporting abdominal pain. Recent admission 04/30/24 with nausea and syncopal episode. PMH includes Arthritis, Breast cancer, Depression, HLD, HTN, and Hypothyroidism; Cholecystectomy; Abdominal hysterectomy (1982); Total mastectomy (Left, 11/20/2021), CKD stage III, history of GI bleed, TIA, and chronic HFpEF .     Clinical Impressions Pt admitted for above, PTA pt reports being ind with ADLs wihile family assists with iADLs, she also reports ambulating with RW. She is a poor historian at baseline. She currently c/o dizziness with prolonged OOB mobility and positional changes, able to ambulate with CGA + RW but dizziness impairs balance. She also needs minA to CGA for ADLs. OT to continue following pt acutely to address listed deficits and help transition to next level of care. Patient will benefit from continued inpatient follow up therapy, <3 hours/day as she reports that her son may not be able to be there all the time when she is mobilizing, wants to remain safe.      If plan is discharge home, recommend the following:   A little help with walking and/or transfers;A little help with bathing/dressing/bathroom;Assistance with cooking/housework;Supervision due to cognitive status     Functional Status Assessment   Patient has had a recent decline in their functional status and/or demonstrates limited ability to make significant improvements in function in a reasonable and predictable amount of time     Equipment Recommendations   None recommended by OT     Recommendations for Other Services         Precautions/Restrictions   Precautions Precautions: Fall Restrictions Weight Bearing Restrictions Per Provider Order: No     Mobility Bed  Mobility Overal bed mobility: Needs Assistance Bed Mobility: Supine to Sit     Supine to sit: Supervision, Used rails          Transfers Overall transfer level: Needs assistance Equipment used: Rolling walker (2 wheels) Transfers: Sit to/from Stand Sit to Stand: Contact guard assist           General transfer comment: Pt stood from EOB CGA, then stood from bedside chair with CGA but felt dizzy duirng transition and self corrected.      Balance Overall balance assessment: Needs assistance   Sitting balance-Leahy Scale: Good Sitting balance - Comments: sitting EOB   Standing balance support: Single extremity supported, Bilateral upper extremity supported, Reliant on assistive device for balance Standing balance-Leahy Scale: Poor Standing balance comment: RW support.                           ADL either performed or assessed with clinical judgement   ADL Overall ADL's : Needs assistance/impaired Eating/Feeding: Independent;Sitting   Grooming: Standing;Wash/dry face;Contact guard assist   Upper Body Bathing: Sitting;Contact guard assist   Lower Body Bathing: Sitting/lateral leans;Contact guard assist   Upper Body Dressing : Sitting;Contact guard assist   Lower Body Dressing: Sit to/from stand;Minimal assistance   Toilet Transfer: Contact guard assist;Rolling walker (2 wheels);Ambulation   Toileting- Clothing Manipulation and Hygiene: Contact guard assist;Sit to/from stand       Functional mobility during ADLs: Contact guard assist;Rolling walker (2 wheels)       Vision   Vision Assessment?: No apparent visual deficits     Perception  Praxis         Pertinent Vitals/Pain Pain Assessment Pain Assessment: No/denies pain     Extremity/Trunk Assessment Upper Extremity Assessment Upper Extremity Assessment: Overall WFL for tasks assessed   Lower Extremity Assessment Lower Extremity Assessment: Generalized weakness   Cervical /  Trunk Assessment Cervical / Trunk Assessment: Kyphotic   Communication Communication Communication: No apparent difficulties   Cognition Arousal: Alert Behavior During Therapy: WFL for tasks assessed/performed Cognition: History of cognitive impairments             OT - Cognition Comments: hx of dementia at baseline, follows simple commands.                 Following commands: Intact       Cueing  General Comments   Cueing Techniques: Verbal cues  reports feeling dizzy during prolonged standing, BP 139/69 but taken seated following incident. Felt dizzy during transiiton to stand as fell.   Exercises     Shoulder Instructions      Home Living Family/patient expects to be discharged to:: Private residence Living Arrangements: Children (son and DIL) Available Help at Discharge: Family;Available 24 hours/day (son and daughter in law work from home) Type of Home: House Home Access: Stairs to enter Secretary/administrator of Steps: 2 Entrance Stairs-Rails: None Home Layout: Two level;Bed/bath upstairs Alternate Level Stairs-Number of Steps: 12;' pt  unsure Alternate Level Stairs-Rails: Left Bathroom Shower/Tub: Producer, television/film/video: Standard     Home Equipment: Shower seat - built in;BSC/3in1;Cane - quad;Cane - single Librarian, academic (2 wheels);Rollator (4 wheels)   Additional Comments: son works from home per pt      Prior Functioning/Environment Prior Level of Function : Patient poor historian/Family not available;Independent/Modified Independent             Mobility Comments: Pt reports she may ambulate no AD or may ambulate with cane depending on need. goes up stairs unaided ADLs Comments: family performs iADLs, ind with ADLs. Used to attend the [facility].    OT Problem List: Decreased strength;Impaired balance (sitting and/or standing);Decreased cognition   OT Treatment/Interventions: Self-care/ADL training;Patient/family  education;Therapeutic activities;DME and/or AE instruction;Balance training      OT Goals(Current goals can be found in the care plan section)   Acute Rehab OT Goals Patient Stated Goal: To get better then go home. OT Goal Formulation: With patient Time For Goal Achievement: 06/11/24 Potential to Achieve Goals: Good ADL Goals Pt Will Perform Grooming: with supervision;standing Pt Will Perform Lower Body Bathing: with set-up;with supervision;sit to/from stand Pt Will Perform Lower Body Dressing: with supervision;sit to/from stand Pt Will Transfer to Toilet: with supervision;ambulating Pt Will Perform Tub/Shower Transfer: Shower transfer;ambulating;with supervision;shower seat   OT Frequency:  Min 2X/week    Co-evaluation              AM-PAC OT 6 Clicks Daily Activity     Outcome Measure Help from another person eating meals?: None Help from another person taking care of personal grooming?: A Little Help from another person toileting, which includes using toliet, bedpan, or urinal?: A Little Help from another person bathing (including washing, rinsing, drying)?: A Little Help from another person to put on and taking off regular upper body clothing?: A Little Help from another person to put on and taking off regular lower body clothing?: A Little 6 Click Score: 19   End of Session Equipment Utilized During Treatment: Gait belt;Rolling walker (2 wheels) Nurse Communication: Mobility status;Other (comment) (dizziness)  Activity  Tolerance: Patient tolerated treatment well Patient left: Other (comment);in chair (with providing PT at bedside)  OT Visit Diagnosis: Unsteadiness on feet (R26.81);Other abnormalities of gait and mobility (R26.89);Muscle weakness (generalized) (M62.81)                Time: 1131-1150 OT Time Calculation (min): 19 min Charges:  OT General Charges $OT Visit: 1 Visit OT Evaluation $OT Eval Low Complexity: 1 Low  05/28/2024  AB, OTR/L  Acute  Rehabilitation Services  Office: [phone]   Curtistine JONETTA Das 05/28/2024, 1:33 PM

## 2024-05-28 NOTE — TOC Progression Note (Addendum)
 Transition of Care [facility]) - Progression Note    Patient Details  Name: RHINA KRAMME MRN: [id number] Date of Birth: Apr 11, 1946  Transition of [facility]) CM/SW Contact  Yonatan Guitron A [place], LCSW Phone Number: 05/28/2024, 10:59 AM  Clinical Narrative:     Update 1554 Pt requested SNF at discharge, son agreeable, preference for returning home with home health if rehab can be provided at facility. Pt preference for [facility], was there recently. CSW contacted [facility] regarding bed openings and if pt is in co-pay days. SNF workup completed, pt faxed out in case [facility] does not have availability, bed offers pending.   1059 CSW met with pt and pt's son Marcey via phone. He informed CSW that pt is from home with son, says that pt had home health with [facility] before hospital admission. CSW notified provider, requested PT/OT evaluation. RNCM notified.    TOC will continue to follow.        Expected Discharge Plan and Services                                               Social Determinants of Health (SDOH) Interventions SDOH Screenings   Food Insecurity: No Food Insecurity (05/25/2024)  Housing: Low Risk  (05/25/2024)  Transportation Needs: No Transportation Needs (05/25/2024)  Utilities: Not At Risk (05/25/2024)  Financial Resource Strain: Patient Declined (12/29/2022)   Received from [facility]  Social Connections: Socially Isolated (05/25/2024)  Tobacco Use: Low Risk  (05/24/2024)    Readmission Risk Interventions    05/01/2024   12:59 PM 09/13/2023   10:08 AM 06/20/2022    1:01 PM  Readmission Risk Prevention Plan  Transportation Screening  Complete   PCP or Specialist Appt within 3-5 Days  Complete Complete  HRI or Home Care Consult  Complete   Social Work Consult for Recovery Care Planning/Counseling  Complete Complete  Palliative Care Screening  Not Applicable Not Applicable  Medication Review Oceanographer) Complete Not Complete    Med Review Comments  Patient's son will speak with nurses upon discharge   PCP or Specialist appointment within 3-5 days of discharge Complete    HRI or Home Care Consult Complete    SW Recovery Care/Counseling Consult Complete    Palliative Care Screening Not Applicable    Skilled Nursing Facility Patient Refused

## 2024-05-28 NOTE — Progress Notes (Signed)
 Assumed care @ 0700, even unlabored respirations, skin warm and dry to the touch, no n/v, tolerated meals with no difficulty, pt able to pivot form bed to [facility] with 1 person assist, call light placed within reach.

## 2024-05-28 NOTE — NC FL2 (Signed)
 [place]  MEDICAID FL2 LEVEL OF CARE FORM     IDENTIFICATION  Patient Name: Cindy Huerta Birthdate: 05-13-1946 Sex: female Admission Date (Current Location): 05/24/2024  [facility] [place] and [place] Number:  Producer, television/film/video and Address:  [facility] [place], [address], [place], [place] [address]      Provider Number: [id number]  Attending Physician Name and Address:  [name], MD  Relative Name and Phone Number:  Alexyss, Balzarini)  [phone]    Current Level of Care: Hospital Recommended Level of Care: Skilled Nursing Facility Prior Approval Number:    Date Approved/Denied:   PASRR Number: [id number]  Discharge Plan: SNF    Current Diagnoses: Patient Active Problem List   Diagnosis Date Noted   Hypochloremia 05/25/2024   Dementia (HCC) 05/25/2024   Nausea and vomiting 04/29/2024   Sepsis (HCC) 09/14/2023   Acute kidney injury superimposed on chronic kidney disease (HCC) 09/13/2023   Leukocytosis 09/13/2023   Abnormal TSH 09/13/2023   AKI (acute kidney injury) (HCC) 08/09/2023   Syncope 08/09/2023   Hypomagnesemia 08/09/2023   QT prolongation 08/09/2023   Acute metabolic encephalopathy 08/09/2023   Elevated troponin 08/09/2023   Iron  deficiency anemia 08/17/2022   Elevated lactic acid level 08/17/2022   UTI (urinary tract infection) 08/17/2022   Fall at home, initial encounter 08/17/2022   Abnormal LFTs 08/17/2022   Dehydration 07/27/2022   Malnutrition of moderate degree 06/30/2022   Acute urinary retention 06/29/2022   Acute renal failure superimposed on stage 3b chronic kidney disease (HCC) 06/28/2022   DNR (do not resuscitate) 06/28/2022   Hyponatremia 06/18/2022   Hypokalemia 06/18/2022   Chronic diastolic CHF (congestive heart failure) (HCC) 06/18/2022   Dysuria 06/18/2022   HTN (hypertension) 11/24/2021   Normocytic anemia 11/24/2021   Vertigo 11/24/2021   Breast CA (HCC) 11/22/2021   Breast cancer (HCC) 11/20/2021    Intractable nausea and vomiting 10/09/2021   Weakness 10/07/2021   Carcinoma of overlapping sites of left breast in female, estrogen receptor positive (HCC) 10/06/2021   Dizziness 10/06/2021   Rectal bleeding 10/06/2021   GIB (gastrointestinal bleeding) 10/06/2021   Left-sided weakness 07/29/2021   HLD (hyperlipidemia) 07/29/2021   Hypothyroidism 07/29/2021   Left sided numbness 07/29/2021   Depression with anxiety    CKD (chronic kidney disease), stage IIIa    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   Epigastric pain 04/23/2021   GERD (gastroesophageal reflux disease) 04/23/2021   Benign essential hypertension 01/20/2021   History of breast cancer 01/20/2021   History of TIA (transient ischemic attack) 01/20/2021   Palpitations 01/20/2021   Malignant neoplasm of 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) 12/06/2018    Orientation RESPIRATION BLADDER Height & Weight     Self, Situation, Place  Normal Continent Weight: 73.3 kg Height:  5' 6 (167.6 cm)  BEHAVIORAL SYMPTOMS/MOOD NEUROLOGICAL BOWEL NUTRITION STATUS      Continent Diet (See Discharge Summary)  AMBULATORY STATUS COMMUNICATION OF NEEDS Skin   Limited Assist Verbally Normal                       Personal Care Assistance Level of Assistance  Feeding, Dressing   Feeding assistance: Limited assistance Dressing Assistance: Limited assistance     Functional Limitations Info             SPECIAL CARE FACTORS FREQUENCY  PT (By licensed PT), OT (By licensed OT)     PT Frequency: 3-5 x per week OT Frequency: 3-5 x  per week            Contractures Contractures Info: Not present    Additional Factors Info  Code Status, Allergies Code Status Info: DNR Allergies Info: Novocain, Zestril, Benadryl , Biaxin, Roxicodone           Current Medications (05/28/2024):  This is the current hospital active medication list Current Facility-Administered Medications  Medication Dose Route Frequency Provider Last  Rate Last Admin   acetaminophen  (TYLENOL ) tablet 650 mg  650 mg Oral Q6H PRN Smith, Rondell A, MD       Or   acetaminophen  (TYLENOL ) suppository 650 mg  650 mg Rectal Q6H PRN Claudene Reeves A, MD       albuterol  (PROVENTIL ) (2.5 MG/3ML) 0.083% nebulizer solution 2.5 mg  2.5 mg Nebulization Q6H PRN Smith, Rondell A, MD       amLODipine  (NORVASC ) tablet 10 mg  10 mg Oral Daily Claudene, Rondell A, MD   10 mg at 05/28/24 0859   aspirin  EC tablet 81 mg  81 mg Oral QHS Smith, Rondell A, MD   81 mg at 05/27/24 2134   atorvastatin  (LIPITOR ) tablet 80 mg  80 mg Oral Daily Smith, Rondell A, MD   80 mg at 05/28/24 0858   [START ON 05/29/2024] cloNIDine  (CATAPRES ) tablet 0.1 mg  0.1 mg Oral Daily [name], MD       enoxaparin  (LOVENOX ) injection 40 mg  40 mg Subcutaneous Q24H Claudene, Rondell A, MD   40 mg at 05/27/24 2133   ferrous sulfate  tablet 325 mg  325 mg Oral QHS Smith, Rondell A, MD   325 mg at 05/27/24 2134   fluticasone  (FLONASE ) 50 MCG/ACT nasal spray 1 spray  1 spray Each Nare Daily PRN Claudene Reeves LABOR, MD       hydrALAZINE  (APRESOLINE ) injection 10 mg  10 mg Intravenous Q4H PRN [name], MD       hydrALAZINE  (APRESOLINE ) tablet 75 mg  75 mg Oral TID [name], MD       labetalol  (NORMODYNE ) injection 10 mg  10 mg Intravenous Q4H PRN [name], MD   10 mg at 05/26/24 1844   levothyroxine  (SYNTHROID ) tablet 50 mcg  50 mcg Oral QAC breakfast Claudene Reeves A, MD   50 mcg at 05/28/24 9192   loratadine  (CLARITIN ) tablet 10 mg  10 mg Oral Daily [name], MD   10 mg at 05/28/24 9141   LORazepam  (ATIVAN ) tablet 0.5 mg  0.5 mg Oral Daily PRN Smith, Rondell A, MD       magnesium  oxide (MAG-OX) tablet 400 mg  400 mg Oral QHS Smith, Rondell A, MD   400 mg at 05/27/24 2134   meclizine  (ANTIVERT ) tablet 12.5 mg  12.5 mg Oral PRN Smith, Rondell A, MD       metoprolol  succinate (TOPROL -XL) 24 hr tablet 50 mg  50 mg Oral BID Claudene, Rondell A, MD   50 mg at 05/28/24 0858   ondansetron   (ZOFRAN ) injection 4 mg  4 mg Intravenous Q8H PRN Smith, Rondell A, MD   4 mg at 05/25/24 1643   pantoprazole  (PROTONIX ) EC tablet 40 mg  40 mg Oral Daily Smith, Rondell A, MD   40 mg at 05/28/24 0859   sodium chloride  flush (NS) 0.9 % injection 3 mL  3 mL Intravenous Q12H Smith, Rondell A, MD   3 mL at 05/28/24 0900   tetrahydrozoline 0.05 % ophthalmic solution 1 drop  1 drop Both Eyes Daily PRN Smith, Rondell  A, MD         Discharge Medications: Please see discharge summary for a list of discharge medications.  Relevant Imaging Results:  Relevant Lab Results:   Additional Information SS# [id number]  Rosaline JONELLE Joe, RN

## 2024-05-28 NOTE — Evaluation (Signed)
 Physical Therapy Evaluation Patient Details Name: Cindy Huerta MRN: [id number] DOB: 19-May-1946 Today's Date: 05/28/2024  History of Present Illness  Pt is a 78 y/o female admitted 05/24/24  with nausea and vomiting, also reporting abdominal pain. Recent admission 04/30/24 with nausea and syncopal episode. PMH includes Arthritis, Breast cancer, Depression, HLD, HTN, and Hypothyroidism; Cholecystectomy; Abdominal hysterectomy (1982); Total mastectomy (Left, 11/20/2021), CKD stage III, history of GI bleed, TIA, and chronic HFpEF .  Clinical Impression  Patient presents with decreased mobility due to generalized weakness, decreased balance and dizziness.  BP measurements not indicative of orthostatic hypotension though pt reports h/o vertigo.  Likely exacerbated due to bedrest, though not formally tested today due to focus on practicing stairs as has second floor bedroom at home and due to pt wanting to eat her lunch.  She was able to ambulate in the room with R and CGA reporting fatigue.  She negotiated 10 steps with L rail and min A throughout.  She reports though family is present they work from home and cannot assist her.  Feel she may need inpatient rehab (<3 hours/day) at d/c.  PT will continue to follow and assess vestibular function next session.       If plan is discharge home, recommend the following: A little help with walking and/or transfers;A little help with bathing/dressing/bathroom;Help with stairs or ramp for entrance;Assist for transportation;Assistance with cooking/housework   Can travel by private vehicle   Yes    Equipment Recommendations None recommended by PT  Recommendations for Other Services       Functional Status Assessment Patient has had a recent decline in their functional status and demonstrates the ability to make significant improvements in function in a reasonable and predictable amount of time.     Precautions / Restrictions Precautions Precautions: Fall       Mobility  Bed Mobility               General bed mobility comments: up with OT    Transfers Overall transfer level: Needs assistance Equipment used: Rolling walker (2 wheels) Transfers: Sit to/from Stand Sit to Stand: Contact guard assist           General transfer comment: assist for balance, and due to feeling dizzy    Ambulation/Gait Ambulation/Gait assistance: Contact guard assist Gait Distance (Feet): 12 Feet Assistive device: Rolling walker (2 wheels) Gait Pattern/deviations: Step-through pattern, Decreased stride length       General Gait Details: around bed to get to recliner in room  Stairs Stairs: Yes Stairs assistance: Min assist Stair Management: One rail Left, Step to pattern, Sideways, Forwards (with hand hold A) Number of Stairs: 10 General stair comments: up stairs with side technique and two hands to one rail with assist for balance, down steps with forward technique and HHA with rail  Wheelchair Mobility     Tilt Bed    Modified Rankin (Stroke Patients Only)       Balance Overall balance assessment: Needs assistance   Sitting balance-Leahy Scale: Good     Standing balance support: Single extremity supported, Bilateral upper extremity supported, Reliant on assistive device for balance Standing balance-Leahy Scale: Poor                               Pertinent Vitals/Pain Pain Assessment Pain Assessment: No/denies pain    Home Living Family/patient expects to be discharged to:: Private residence Living Arrangements: Children (son and  wife) Available Help at Discharge: Family;Available 24 hours/day (son and daughter in law work from home) Type of Home: House Home Access: Stairs to enter Entrance Stairs-Rails: None Entrance Stairs-Number of Steps: 2 Alternate Level Stairs-Number of Steps: 12;' pt  unsure Home Layout: Two level;Bed/bath upstairs Home Equipment: Shower seat - built in;BSC/3in1;Cane -  quad;Cane - single point;Rolling Walker (2 wheels);Rollator (4 wheels) Additional Comments: son works from home per pt    Prior Function Prior Level of Function : Patient poor historian/Family not available;Independent/Modified Independent             Mobility Comments: Pt reports she may ambulate no AD or may ambulate with cane depending on need. goes up stairs unaided ADLs Comments: family performs iADLs, ind with ADLs. Used to attend the [facility].     Extremity/Trunk Assessment   Upper Extremity Assessment Upper Extremity Assessment: Defer to OT evaluation    Lower Extremity Assessment Lower Extremity Assessment: Generalized weakness    Cervical / Trunk Assessment Cervical / Trunk Assessment: Kyphotic  Communication   Communication Communication: No apparent difficulties    Cognition Arousal: Alert Behavior During Therapy: WFL for tasks assessed/performed   PT - Cognitive impairments: History of cognitive impairments, No family/caregiver present to determine baseline                       PT - Cognition Comments: seems well oriented and asking for rehab (not feeling safe for home as family works at home and cannot help her) Following commands: Intact       Cueing Cueing Techniques: Verbal cues     General Comments General comments (skin integrity, edema, etc.): reports feeling dizzy and as if moving after settled in recliner; reports history of vertigo and took medicine for it.  Seems more like likely hypofunction though not formally tested (pt wanting to eat lunch after ambulation)    Exercises     Assessment/Plan    PT Assessment Patient needs continued PT services  PT Problem List Decreased activity tolerance;Decreased balance;Decreased mobility;Decreased knowledge of use of DME       PT Treatment Interventions DME instruction;Gait training;Stair training;Patient/family education;Neuromuscular re-education;Functional mobility  training;Therapeutic activities;Therapeutic exercise;Balance training;Canalith reposition    PT Goals (Current goals can be found in the Care Plan section)  Acute Rehab PT Goals Patient Stated Goal: patient stating feels she needs rehab prior to home PT Goal Formulation: With patient Time For Goal Achievement: 06/11/24 Potential to Achieve Goals: Good    Frequency Min 2X/week     Co-evaluation               AM-PAC PT 6 Clicks Mobility  Outcome Measure Help needed turning from your back to your side while in a flat bed without using bedrails?: A Little Help needed moving from lying on your back to sitting on the side of a flat bed without using bedrails?: A Little Help needed moving to and from a bed to a chair (including a wheelchair)?: A Little Help needed standing up from a chair using your arms (e.g., wheelchair or bedside chair)?: A Little Help needed to walk in hospital room?: A Little Help needed climbing 3-5 steps with a railing? : A Little 6 Click Score: 18    End of Session Equipment Utilized During Treatment: Gait belt Activity Tolerance: Patient limited by fatigue Patient left: in chair;with call bell/phone within reach;with chair alarm set   PT Visit Diagnosis: Muscle weakness (generalized) (M62.81);Other abnormalities of gait and mobility (  R26.89);Dizziness and giddiness (R42)    Time: 8849-8784 PT Time Calculation (min) (ACUTE ONLY): 25 min   Charges:   PT Evaluation $PT Eval Moderate Complexity: 1 Mod PT Treatments $Gait Training: 8-22 mins PT General Charges $$ ACUTE PT VISIT: 1 Visit         Micheline Portal, PT Acute Rehabilitation Services Office:[phone] 05/28/2024   Montie Portal 05/28/2024, 1:05 PM

## 2024-05-28 NOTE — Discharge Instructions (Signed)
 Check blood pressure daily and record values to bring to outpatient follow up visit with primary care.  If your blood pressure at home is greater than 150/95 then take an extra tablet of clonidine  0.1 mg also.  If you blood pressure at home is less than 90/60 then reduce your hydralazine  to 50 mg three times a day (2 tablets of the new prescription which is 25 mg tablets).

## 2024-05-28 NOTE — Assessment & Plan Note (Signed)
-   Still present intermittently and still affecting her ability to ambulate safely and work with therapy -Does not sound like safe for returning home unless family able to manage -PT recommending SNF for now -Given persistence of dizziness, will check MRI brain at this time

## 2024-05-28 NOTE — Progress Notes (Signed)
 Progress Note    Cindy Huerta   FMW:[id number]  DOB: [birth year]  DOA: 05/24/2024     3 PCP: Glover [name], MD  Initial CC: N/V  Hospital Course: Cindy Huerta is an 78 yo female with PMH dementia, HTN, HLD, CHF, depression, GERD, CAD, hypothyroidism who presented with nausea and vomiting. There was also some reported abdominal pain but this seemed to have started after she developed nausea and vomiting. No urinary symptoms such as burning, frequency, or discomfort during urination. No cough, shortness of breath, or fever.   Her gallbladder has been removed in the past. She does not consume alcohol  or smoke.  Over the phone her son adds additional information and states that she seems to get symptoms of nausea and vomiting with the weather changes.  Patient had been seen in the hospital on 5/25-5/28 with similar intractable nausea and vomiting, found to have leukocytosis, hypertensive urgency, AKI, and hyponatremia which resolved with supportive care.  Imaging studies have been done but found no acute abnormalities.  He felt that that hospitalization was likely due to something she ate, but she had not eaten anything out of the norm to his knowledge.   CT abdomen/pelvis showed diverticulosis otherwise no acute findings.  CXR also unremarkable.  Urinalysis negative for signs of infection.  She was started on fluids and admitted for further workup.  Interval History:  No events overnight.  Unfortunately still became dizzy when working with therapy today. Canceling discharge and will pursue MRI brain at this time and see about family opinion on patient going to rehab.  Assessment and Plan: * Nausea & vomiting-resolved as of 05/28/2024 - Workup has been negative.  CT abdomen/pelvis reassuring with no acute findings.  Denies any diarrhea -Suspecting most likely a viral syndrome; sounds like also some dizziness may be contributing to her vomiting - Other considered differential is CVA causing  persistent dizziness leading to nausea/vomiting -Has tolerated diet advancement  Dizziness - Still present intermittently and still affecting her ability to ambulate safely and work with therapy -Does not sound like safe for returning home unless family able to manage -PT recommending SNF for now -Given persistence of dizziness, will check MRI brain at this time  Hypertensive urgency-resolved as of 05/27/2024 - Possibly pain related and/or unable to take medications - Continue PRN meds also  Leukocytosis - Suspected reactive with possible hemoconcentration - Trend for now  Chronic diastolic CHF (congestive heart failure) (HCC) - No signs/symptoms of exacerbation - Last echo 08/09/2019 4: EF 60 to 65%, no RWMA, indeterminate diastolic parameters -Monitor while on fluids  Hyponatremia - Sodium 125 on admission. -Suspected hypovolemic hyponatremia from nausea/vomiting - Improved with fluids - Continue diet  Lactic acidosis-resolved as of 05/27/2024 - Likely combination of volume depletion/dehydration with viral illness  Elevated troponin - No chest pains and flat trend  QT prolongation - Monitoring electrolytes  Hypomagnesemia - Replete as needed  Dementia (HCC) - At baseline - Continue delirium precautions  CKD (chronic kidney disease), stage IIIa - patient has history of CKD3a. Baseline creat ~ 1.2, eGFR~ 50-55   Depression with anxiety - No home meds noted  Hypothyroidism - Likely sick euthyroid -TSH elevated, 6.258.  Free T4 also mildly elevated, 1.25 -Continue Synthroid  - Could consider repeating thyroid  studies in about 4 weeks  History of TIA (transient ischemic attack) - Continue aspirin , statin    Old records reviewed in assessment of this patient  Antimicrobials:   DVT prophylaxis:  enoxaparin  (LOVENOX ) injection 40 mg  Start: 05/25/24 2200   Code Status:   Code Status: Do not attempt resuscitation (DNR) PRE-ARREST INTERVENTIONS  DESIRED  Mobility Assessment (Last 72 Hours)     Mobility Assessment     Row Name 05/28/24 1303 05/28/24 0845 05/27/24 2345 05/27/24 0740 05/26/24 2351   Does patient have an order for bedrest or is patient medically unstable -- No - Continue assessment No - Continue assessment No - Continue assessment No - Continue assessment   What is the highest level of mobility based on the progressive mobility assessment? Level 5 (Walks with assist in room/hall) - Balance while stepping forward/back and can walk in room with assist - Complete Level 1 (Bedfast) - Unable to balance while sitting on edge of bed Level 1 (Bedfast) - Unable to balance while sitting on edge of bed Level 1 (Bedfast) - Unable to balance while sitting on edge of bed Level 1 (Bedfast) - Unable to balance while sitting on edge of bed   Is the above level different from baseline mobility prior to current illness? -- Yes - Recommend PT order -- Yes - Recommend PT order --    Row Name 05/26/24 0830 05/25/24 2225         Does patient have an order for bedrest or is patient medically unstable No - Continue assessment No - Continue assessment      What is the highest level of mobility based on the progressive mobility assessment? Level 1 (Bedfast) - Unable to balance while sitting on edge of bed Level 1 (Bedfast) - Unable to balance while sitting on edge of bed         Barriers to discharge: none Disposition Plan: TBD HH orders placed: TBD Status is: Inpt  Objective: Blood pressure 139/71, pulse 70, temperature 98 F (36.7 C), resp. rate 18, height 5' 6 (1.676 m), weight 73.3 kg, SpO2 100%.  Examination:  Physical Exam Constitutional:      Appearance: Normal appearance.  HENT:     Head: Normocephalic and atraumatic.     Mouth/Throat:     Mouth: Mucous membranes are moist.   Eyes:     Extraocular Movements: Extraocular movements intact.    Cardiovascular:     Rate and Rhythm: Normal rate and regular rhythm.  Pulmonary:      Effort: Pulmonary effort is normal. No respiratory distress.     Breath sounds: Normal breath sounds. No wheezing.  Abdominal:     General: Bowel sounds are normal. There is no distension.     Palpations: Abdomen is soft.     Tenderness: There is no abdominal tenderness.   Musculoskeletal:        General: Normal range of motion.     Cervical back: Normal range of motion and neck supple.   Skin:    General: Skin is warm and dry.   Neurological:     General: No focal deficit present.     Mental Status: She is alert.     Comments: Underlying dementia appreciated   Psychiatric:        Mood and Affect: Mood normal.      Consultants:    Procedures:    Data Reviewed: Results for orders placed or performed during the hospital encounter of 05/24/24 (from the past 24 hours)  Basic metabolic panel with GFR     Status: Abnormal   Collection Time: 05/28/24  4:25 AM  Result Value Ref Range   Sodium 131 (L) 135 - 145 mmol/L   Potassium  3.6 3.5 - 5.1 mmol/L   Chloride 103 98 - 111 mmol/L   CO2 19 (L) 22 - 32 mmol/L   Glucose, Bld 96 70 - 99 mg/dL   BUN 8 8 - 23 mg/dL   Creatinine, Ser 9.05 0.44 - 1.00 mg/dL   Calcium  8.9 8.9 - 10.3 mg/dL   GFR, Estimated >39 >39 mL/min   Anion gap 9 5 - 15  CBC with Differential/Platelet     Status: Abnormal   Collection Time: 05/28/24  4:25 AM  Result Value Ref Range   WBC 14.7 (H) 4.0 - 10.5 K/uL   RBC 3.78 (L) 3.87 - 5.11 MIL/uL   Hemoglobin 11.6 (L) 12.0 - 15.0 g/dL   HCT 65.2 (L) 63.9 - 53.9 %   MCV 91.8 80.0 - 100.0 fL   MCH 30.7 26.0 - 34.0 pg   MCHC 33.4 30.0 - 36.0 g/dL   RDW 86.9 88.4 - 84.4 %   Platelets 203 150 - 400 K/uL   nRBC 0.0 0.0 - 0.2 %   Neutrophils Relative % 71 %   Neutro Abs 10.6 (H) 1.7 - 7.7 K/uL   Lymphocytes Relative 15 %   Lymphs Abs 2.1 0.7 - 4.0 K/uL   Monocytes Relative 10 %   Monocytes Absolute 1.4 (H) 0.1 - 1.0 K/uL   Eosinophils Relative 3 %   Eosinophils Absolute 0.4 0.0 - 0.5 K/uL   Basophils  Relative 0 %   Basophils Absolute 0.1 0.0 - 0.1 K/uL   Immature Granulocytes 1 %   Abs Immature Granulocytes 0.10 (H) 0.00 - 0.07 K/uL  Magnesium      Status: Abnormal   Collection Time: 05/28/24  4:25 AM  Result Value Ref Range   Magnesium  1.5 (L) 1.7 - 2.4 mg/dL    I have reviewed pertinent nursing notes, vitals, labs, and images as necessary. I have ordered labwork to follow up on as indicated.  I have reviewed the last notes from staff over past 24 hours. I have discussed patient's care plan and test results with nursing staff, CM/SW, and other staff as appropriate.  Time spent: Greater than 50% of the 55 minute visit was spent in counseling/coordination of care for the patient as laid out in the A&P.   LOS: 3 days   Alm Apo, MD [facility] 05/28/2024, 1:26 PM

## 2024-05-28 NOTE — Plan of Care (Signed)

## 2024-05-29 DIAGNOSIS — D72829 Elevated white blood cell count, unspecified: Secondary | ICD-10-CM | POA: Diagnosis not present

## 2024-05-29 DIAGNOSIS — R42 Dizziness and giddiness: Secondary | ICD-10-CM | POA: Diagnosis not present

## 2024-05-29 DIAGNOSIS — E871 Hypo-osmolality and hyponatremia: Secondary | ICD-10-CM | POA: Diagnosis not present

## 2024-05-29 DIAGNOSIS — R112 Nausea with vomiting, unspecified: Secondary | ICD-10-CM | POA: Diagnosis not present

## 2024-05-29 LAB — CBC WITH DIFFERENTIAL/PLATELET
Abs Immature Granulocytes: 0.1 10*3/uL — ABNORMAL HIGH (ref 0.00–0.07)
Basophils Absolute: 0.1 10*3/uL (ref 0.0–0.1)
Basophils Relative: 0 %
Eosinophils Absolute: 0.4 10*3/uL (ref 0.0–0.5)
Eosinophils Relative: 3 %
HCT: 32.4 % — ABNORMAL LOW (ref 36.0–46.0)
Hemoglobin: 11.2 g/dL — ABNORMAL LOW (ref 12.0–15.0)
Immature Granulocytes: 1 %
Lymphocytes Relative: 18 %
Lymphs Abs: 2.5 10*3/uL (ref 0.7–4.0)
MCH: 30.9 pg (ref 26.0–34.0)
MCHC: 34.6 g/dL (ref 30.0–36.0)
MCV: 89.5 fL (ref 80.0–100.0)
Monocytes Absolute: 1.3 10*3/uL — ABNORMAL HIGH (ref 0.1–1.0)
Monocytes Relative: 10 %
Neutro Abs: 9.1 10*3/uL — ABNORMAL HIGH (ref 1.7–7.7)
Neutrophils Relative %: 68 %
Platelets: 232 10*3/uL (ref 150–400)
RBC: 3.62 MIL/uL — ABNORMAL LOW (ref 3.87–5.11)
RDW: 13.1 % (ref 11.5–15.5)
WBC: 13.4 10*3/uL — ABNORMAL HIGH (ref 4.0–10.5)
nRBC: 0 % (ref 0.0–0.2)

## 2024-05-29 LAB — BASIC METABOLIC PANEL WITH GFR
Anion gap: 6 (ref 5–15)
BUN: 11 mg/dL (ref 8–23)
CO2: 24 mmol/L (ref 22–32)
Calcium: 8.8 mg/dL — ABNORMAL LOW (ref 8.9–10.3)
Chloride: 99 mmol/L (ref 98–111)
Creatinine, Ser: 1.12 mg/dL — ABNORMAL HIGH (ref 0.44–1.00)
GFR, Estimated: 50 mL/min — ABNORMAL LOW (ref >60)
Glucose, Bld: 98 mg/dL (ref 70–99)
Potassium: 3.3 mmol/L — ABNORMAL LOW (ref 3.5–5.1)
Sodium: 129 mmol/L — ABNORMAL LOW (ref 135–145)

## 2024-05-29 LAB — MAGNESIUM: Magnesium: 1.7 mg/dL (ref 1.7–2.4)

## 2024-05-29 MED ORDER — POTASSIUM CHLORIDE CRYS ER 20 MEQ PO TBCR
40.0000 meq | EXTENDED_RELEASE_TABLET | Freq: Once | ORAL | Status: AC
  Administered 2024-05-29: 40 meq via ORAL
  Filled 2024-05-29: qty 2

## 2024-05-29 NOTE — Progress Notes (Signed)
 Mobility Specialist: Progress Note   05/29/24 1613  Mobility  Activity Ambulated with assistance in hallway  Level of Assistance Contact guard assist, steadying assist  Assistive Device Front wheel walker  Distance Ambulated (ft) 150 ft  Activity Response Tolerated well  Mobility Referral Yes  Mobility visit 1 Mobility  Mobility Specialist Start Time (ACUTE ONLY) 1355  Mobility Specialist Stop Time (ACUTE ONLY) 1415  Mobility Specialist Time Calculation (min) (ACUTE ONLY) 20 min    Pt received in chair, agreeable to additional mobility session. CG throughout. Denies any dizziness. Distance limited by LE muscle fatigue. Ambulated to the NS and back without fault. Left in bed with all needs met, call bell in reach.   Ileana Lute Mobility Specialist Please contact via SecureChat or Rehab office at [phone]

## 2024-05-29 NOTE — Progress Notes (Signed)
 Physical Therapy Treatment Patient Details Name: Cindy Huerta MRN: [id number] DOB: [birth year] Today's Date: 05/29/2024   History of Present Illness Pt is a 78 y/o female admitted 05/24/24  with nausea and vomiting, also reporting abdominal pain. Recent admission 04/30/24 with nausea and syncopal episode. PMH includes Arthritis, Breast cancer, Depression, HLD, HTN, and Hypothyroidism; Cholecystectomy; Abdominal hysterectomy (1982); Total mastectomy (Left, 11/20/2021), CKD stage III, history of GI bleed, TIA, and chronic HFpEF .    PT Comments  Patient progressing with mobility and denied dizziness this session, though nervous to complete vestibular exam due to fear it might make her dizzy.  Education completed about progressive mobility for either adaptation or habituation.  Fearful walking backwards needing mod cues and A for safety backing to bed in the room.  She remains appropriate for pos-acute inpatient rehab at d/c.     If plan is discharge home, recommend the following: A little help with walking and/or transfers;A little help with bathing/dressing/bathroom;Help with stairs or ramp for entrance;Assist for transportation;Assistance with cooking/housework   Can travel by private vehicle     Yes  Equipment Recommendations  None recommended by PT    Recommendations for Other Services       Precautions / Restrictions Precautions Precautions: Fall     Mobility  Bed Mobility Overal bed mobility: Needs Assistance       Supine to sit: Supervision     General bed mobility comments: assist for environmental set up    Transfers Overall transfer level: Needs assistance Equipment used: Rolling walker (2 wheels) Transfers: Sit to/from Stand Sit to Stand: Contact guard assist           General transfer comment: assist for balance though no c/o dizziness today    Ambulation/Gait Ambulation/Gait assistance: Contact guard assist, Supervision Gait Distance (Feet): 140  Feet Assistive device: Rolling walker (2 wheels) Gait Pattern/deviations: Step-through pattern, Decreased stride length, Trunk flexed       General Gait Details: in hallway with RW with occasional A due to fatigue and in room for balance backing up to bed with cues for walker proximity   Stairs             Wheelchair Mobility     Tilt Bed    Modified Rankin (Stroke Patients Only)       Balance Overall balance assessment: Needs assistance   Sitting balance-Leahy Scale: Good     Standing balance support: Single extremity supported, Bilateral upper extremity supported, Reliant on assistive device for balance Standing balance-Leahy Scale: Poor Standing balance comment: RW support.                            Communication Communication Communication: No apparent difficulties  Cognition Arousal: Alert Behavior During Therapy: WFL for tasks assessed/performed   PT - Cognitive impairments: History of cognitive impairments, No family/caregiver present to determine baseline                       PT - Cognition Comments: remembered walking in hallway earlier today, asking to speak with social worker about getting out of here Following commands: Intact      Cueing Cueing Techniques: Verbal cues  Exercises      General Comments General comments (skin integrity, edema, etc.): denied dizziness today, initiated vestibular exam though pt declined to continue during VOR testing.  Did note some issues with smooth pursuits with end gaze nystagmus though saccades slow  but WNL.  Educated if no longer feeling dizzy likely improved with increased mobility so to continue with A mobility to improve vestibular adapation (if hypofuntion) or habituation (if central cause)      Pertinent Vitals/Pain Pain Assessment Pain Assessment: No/denies pain    Home Living                          Prior Function            PT Goals (current goals can now  be found in the care plan section) Progress towards PT goals: Progressing toward goals    Frequency    Min 2X/week      PT Plan      Co-evaluation              AM-PAC PT 6 Clicks Mobility   Outcome Measure  Help needed turning from your back to your side while in a flat bed without using bedrails?: A Little Help needed moving from lying on your back to sitting on the side of a flat bed without using bedrails?: A Little Help needed moving to and from a bed to a chair (including a wheelchair)?: A Little Help needed standing up from a chair using your arms (e.g., wheelchair or bedside chair)?: A Little Help needed to walk in hospital room?: A Little Help needed climbing 3-5 steps with a railing? : A Little 6 Click Score: 18    End of Session Equipment Utilized During Treatment: Gait belt Activity Tolerance: Patient limited by fatigue Patient left: in bed;with bed alarm set   PT Visit Diagnosis: Muscle weakness (generalized) (M62.81);Other abnormalities of gait and mobility (R26.89);Dizziness and giddiness (R42)     Time: 8457-8396 PT Time Calculation (min) (ACUTE ONLY): 21 min  Charges:    $Gait Training: 8-22 mins PT General Charges $$ ACUTE PT VISIT: 1 Visit                     Micheline Huerta, PT Acute Rehabilitation Services Office:[phone] 05/29/2024    Cindy Huerta 05/29/2024, 7:17 PM

## 2024-05-29 NOTE — Progress Notes (Addendum)
 Mobility Specialist: Progress Note   05/29/24 1611  Mobility  Activity Ambulated with assistance to bathroom  Level of Assistance Contact guard assist, steadying assist  Assistive Device Front wheel walker  Distance Ambulated (ft) 30 ft  Activity Response Tolerated well  Mobility Referral Yes  Mobility visit 1 Mobility  Mobility Specialist Start Time (ACUTE ONLY) 1210  Mobility Specialist Stop Time (ACUTE ONLY) 1224  Mobility Specialist Time Calculation (min) (ACUTE ONLY) 14 min    Pt received in bed, agreeable to mobility session. Requested to use the BR then sit up in the chair for lunch. Ambulated to the BR and then to the chair without fault. No complaints; denies any dizziness. Left in chair with all needs met, call bell in reach.   Ileana Lute Mobility Specialist Please contact via SecureChat or Rehab office at [phone]

## 2024-05-29 NOTE — TOC Progression Note (Signed)
 Transition of Care [facility]) - Progression Note    Patient Details  Name: Cindy Huerta MRN: [id number] Date of Birth: 05/22/1946  [facility]) CM/SW Contact  Verdene Creson A [place], LCSW Phone Number: 05/29/2024, 4:23 PM  Clinical Narrative:     CSW followed up with [facility] regarding bed offers to facility for placement. They informed CSW that pt has 5 days left on her 20 day Medicare coverage before she in in copay days at $204/day. CSW reached out to pt's son, Marcey, informed him of the bed offer and bed days, he stated that would be fine for pt to pay the copay days if needed.   CSW then met with pt at bedside and followed up and informed her of bed offer and copay days, she also accepted, requested CSW reach out to [place], which was done previously. Pt and family notified of placement, agreeable to [facility] for rehab.   Bed offer accepted to [facility], CSW to start insurance authorization as pt is medically stable for DC.   TOC will continue to follow.        Expected Discharge Plan and Services         Expected Discharge Date: 05/28/24                                     Social Determinants of Health (SDOH) Interventions SDOH Screenings   Food Insecurity: No Food Insecurity (05/25/2024)  Housing: Low Risk  (05/25/2024)  Transportation Needs: No Transportation Needs (05/25/2024)  Utilities: Not At Risk (05/25/2024)  Financial Resource Strain: Patient Declined (12/29/2022)   Received from [facility]  Social Connections: Socially Isolated (05/25/2024)  Tobacco Use: Low Risk  (05/24/2024)    Readmission Risk Interventions    05/01/2024   12:59 PM 09/13/2023   10:08 AM 06/20/2022    1:01 PM  Readmission Risk Prevention Plan  Transportation Screening  Complete   PCP or Specialist Appt within 3-5 Days  Complete Complete  HRI or Home Care Consult  Complete   Social Work Consult for Recovery Care Planning/Counseling  Complete Complete   Palliative Care Screening  Not Applicable Not Applicable  Medication Review Oceanographer) Complete Not Complete   Med Review Comments  Patient's son will speak with nurses upon discharge   PCP or Specialist appointment within 3-5 days of discharge Complete    HRI or Home Care Consult Complete    SW Recovery Care/Counseling Consult Complete    Palliative Care Screening Not Applicable    Skilled Nursing Facility Patient Refused

## 2024-05-29 NOTE — Progress Notes (Signed)
 Progress Note    Cindy Huerta   FMW:[id number]  DOB: 1946/08/15  DOA: 05/24/2024     4 PCP: Glover [name], MD  Initial CC: N/V  Hospital Course: [name] is an 78 yo female with PMH dementia, HTN, HLD, CHF, depression, GERD, CAD, hypothyroidism who presented with nausea and vomiting. There was also some reported abdominal pain but this seemed to have started after she developed nausea and vomiting. No urinary symptoms such as burning, frequency, or discomfort during urination. No cough, shortness of breath, or fever.   Her gallbladder has been removed in the past. She does not consume alcohol  or smoke.  Over the phone her son adds additional information and states that she seems to get symptoms of nausea and vomiting with the weather changes.  Patient had been seen in the hospital on 5/25-5/28 with similar intractable nausea and vomiting, found to have leukocytosis, hypertensive urgency, AKI, and hyponatremia which resolved with supportive care.  Imaging studies have been done but found no acute abnormalities.  He felt that that hospitalization was likely due to something she ate, but she had not eaten anything out of the norm to his knowledge.   CT abdomen/pelvis showed diverticulosis otherwise no acute findings.  CXR also unremarkable.  Urinalysis negative for signs of infection.  She was started on fluids and admitted for further workup.  Interval History:  No events overnight.  Medically stable and awaiting SNF placement.  Assessment and Plan: * Nausea & vomiting-resolved as of 05/28/2024 - Workup has been negative.  CT abdomen/pelvis reassuring with no acute findings.  Denies any diarrhea -Suspecting most likely a viral syndrome; sounds like also some dizziness may be contributing to her vomiting - MRI brain reassuring as negative for stroke as well -Has tolerated diet advancement  Dizziness - Still present intermittently and still affecting her ability to ambulate safely  and work with therapy -Does not sound like safe for returning home unless family able to manage -PT recommending SNF for now - MRI brain obtained on 05/28/2024 for thoroughness.  Negative for acute stroke  Hypertensive urgency-resolved as of 05/27/2024 - Possibly pain related and/or unable to take medications - Continue PRN meds also  Leukocytosis - Suspected reactive with possible hemoconcentration and/or viral illness - Trend for now  Chronic diastolic CHF (congestive heart failure) (HCC) - No signs/symptoms of exacerbation - Last echo 08/09/2019 4: EF 60 to 65%, no RWMA, indeterminate diastolic parameters -Monitor while on fluids  Hyponatremia - Sodium 125 on admission. -Suspected hypovolemic hyponatremia from nausea/vomiting - Improved with fluids - Continue diet  Lactic acidosis-resolved as of 05/27/2024 - Likely combination of volume depletion/dehydration with viral illness  Elevated troponin - No chest pains and flat trend  QT prolongation - Monitoring electrolytes  Hypomagnesemia - Replete as needed  Dementia (HCC) - At baseline - Continue delirium precautions  CKD (chronic kidney disease), stage IIIa - patient has history of CKD3a. Baseline creat ~ 1.2, eGFR~ 50-55   Depression with anxiety - No home meds noted  Hypothyroidism - Likely sick euthyroid -TSH elevated, 6.258.  Free T4 also mildly elevated, 1.25 -Continue Synthroid  - Could consider repeating thyroid  studies in about 4 weeks  History of TIA (transient ischemic attack) - Continue aspirin , statin    Old records reviewed in assessment of this patient  Antimicrobials:   DVT prophylaxis:  enoxaparin  (LOVENOX ) injection 40 mg Start: 05/25/24 2200   Code Status:   Code Status: Do not attempt resuscitation (DNR) PRE-ARREST INTERVENTIONS DESIRED  Mobility Assessment (Last 72 Hours)     Mobility Assessment     Row Name 05/29/24 0900 05/28/24 2130 05/28/24 1330 05/28/24 1303 05/28/24 0845    Does patient have an order for bedrest or is patient medically unstable No - Continue assessment No - Continue assessment -- -- No - Continue assessment   What is the highest level of mobility based on the progressive mobility assessment? Level 5 (Walks with assist in room/hall) - Balance while stepping forward/back and can walk in room with assist - Complete Level 5 (Walks with assist in room/hall) - Balance while stepping forward/back and can walk in room with assist - Complete Level 5 (Walks with assist in room/hall) - Balance while stepping forward/back and can walk in room with assist - Complete Level 5 (Walks with assist in room/hall) - Balance while stepping forward/back and can walk in room with assist - Complete Level 1 (Bedfast) - Unable to balance while sitting on edge of bed   Is the above level different from baseline mobility prior to current illness? Yes - Recommend PT order Yes - Recommend PT order -- -- Yes - Recommend PT order    Row Name 05/27/24 2345 05/27/24 0740 05/26/24 2351       Does patient have an order for bedrest or is patient medically unstable No - Continue assessment No - Continue assessment No - Continue assessment     What is the highest level of mobility based on the progressive mobility assessment? Level 1 (Bedfast) - Unable to balance while sitting on edge of bed Level 1 (Bedfast) - Unable to balance while sitting on edge of bed Level 1 (Bedfast) - Unable to balance while sitting on edge of bed     Is the above level different from baseline mobility prior to current illness? -- Yes - Recommend PT order --        Barriers to discharge: none Disposition Plan: TBD HH orders placed: TBD Status is: Inpt  Objective: Blood pressure 132/63, pulse 69, temperature 98 F (36.7 C), resp. rate 18, height 5' 6 (1.676 m), weight 73.4 kg, SpO2 100%.  Examination:  Physical Exam Constitutional:      Appearance: Normal appearance.  HENT:     Head: Normocephalic and  atraumatic.     Mouth/Throat:     Mouth: Mucous membranes are moist.   Eyes:     Extraocular Movements: Extraocular movements intact.    Cardiovascular:     Rate and Rhythm: Normal rate and regular rhythm.  Pulmonary:     Effort: Pulmonary effort is normal. No respiratory distress.     Breath sounds: Normal breath sounds. No wheezing.  Abdominal:     General: Bowel sounds are normal. There is no distension.     Palpations: Abdomen is soft.     Tenderness: There is no abdominal tenderness.   Musculoskeletal:        General: Normal range of motion.     Cervical back: Normal range of motion and neck supple.   Skin:    General: Skin is warm and dry.   Neurological:     General: No focal deficit present.     Mental Status: She is alert.     Comments: Underlying dementia appreciated   Psychiatric:        Mood and Affect: Mood normal.      Consultants:    Procedures:    Data Reviewed: Results for orders placed or performed during the hospital encounter of  05/24/24 (from the past 24 hours)  Basic metabolic panel with GFR     Status: Abnormal   Collection Time: 05/29/24  4:24 AM  Result Value Ref Range   Sodium 129 (L) 135 - 145 mmol/L   Potassium 3.3 (L) 3.5 - 5.1 mmol/L   Chloride 99 98 - 111 mmol/L   CO2 24 22 - 32 mmol/L   Glucose, Bld 98 70 - 99 mg/dL   BUN 11 8 - 23 mg/dL   Creatinine, Ser 8.87 (H) 0.44 - 1.00 mg/dL   Calcium  8.8 (L) 8.9 - 10.3 mg/dL   GFR, Estimated 50 (L) >60 mL/min   Anion gap 6 5 - 15  CBC with Differential/Platelet     Status: Abnormal   Collection Time: 05/29/24  4:24 AM  Result Value Ref Range   WBC 13.4 (H) 4.0 - 10.5 K/uL   RBC 3.62 (L) 3.87 - 5.11 MIL/uL   Hemoglobin 11.2 (L) 12.0 - 15.0 g/dL   HCT 67.5 (L) 63.9 - 53.9 %   MCV 89.5 80.0 - 100.0 fL   MCH 30.9 26.0 - 34.0 pg   MCHC 34.6 30.0 - 36.0 g/dL   RDW 86.8 88.4 - 84.4 %   Platelets 232 150 - 400 K/uL   nRBC 0.0 0.0 - 0.2 %   Neutrophils Relative % 68 %   Neutro Abs 9.1  (H) 1.7 - 7.7 K/uL   Lymphocytes Relative 18 %   Lymphs Abs 2.5 0.7 - 4.0 K/uL   Monocytes Relative 10 %   Monocytes Absolute 1.3 (H) 0.1 - 1.0 K/uL   Eosinophils Relative 3 %   Eosinophils Absolute 0.4 0.0 - 0.5 K/uL   Basophils Relative 0 %   Basophils Absolute 0.1 0.0 - 0.1 K/uL   Immature Granulocytes 1 %   Abs Immature Granulocytes 0.10 (H) 0.00 - 0.07 K/uL  Magnesium      Status: None   Collection Time: 05/29/24  4:24 AM  Result Value Ref Range   Magnesium  1.7 1.7 - 2.4 mg/dL    I have reviewed pertinent nursing notes, vitals, labs, and images as necessary. I have ordered labwork to follow up on as indicated.  I have reviewed the last notes from staff over past 24 hours. I have discussed patient's care plan and test results with nursing staff, CM/SW, and other staff as appropriate.    LOS: 4 days   Alm Apo, MD [facility] 05/29/2024, 1:58 PM

## 2024-05-29 NOTE — Plan of Care (Signed)

## 2024-05-30 DIAGNOSIS — R112 Nausea with vomiting, unspecified: Secondary | ICD-10-CM | POA: Diagnosis not present

## 2024-05-30 LAB — CBC WITH DIFFERENTIAL/PLATELET
Abs Immature Granulocytes: 0.08 10*3/uL — ABNORMAL HIGH (ref 0.00–0.07)
Basophils Absolute: 0.1 10*3/uL (ref 0.0–0.1)
Basophils Relative: 1 %
Eosinophils Absolute: 0.4 10*3/uL (ref 0.0–0.5)
Eosinophils Relative: 3 %
HCT: 31.4 % — ABNORMAL LOW (ref 36.0–46.0)
Hemoglobin: 10.7 g/dL — ABNORMAL LOW (ref 12.0–15.0)
Immature Granulocytes: 1 %
Lymphocytes Relative: 22 %
Lymphs Abs: 2.7 10*3/uL (ref 0.7–4.0)
MCH: 30.9 pg (ref 26.0–34.0)
MCHC: 34.1 g/dL (ref 30.0–36.0)
MCV: 90.8 fL (ref 80.0–100.0)
Monocytes Absolute: 1.3 10*3/uL — ABNORMAL HIGH (ref 0.1–1.0)
Monocytes Relative: 11 %
Neutro Abs: 7.6 10*3/uL (ref 1.7–7.7)
Neutrophils Relative %: 62 %
Platelets: 211 10*3/uL (ref 150–400)
RBC: 3.46 MIL/uL — ABNORMAL LOW (ref 3.87–5.11)
RDW: 13.2 % (ref 11.5–15.5)
WBC: 12 10*3/uL — ABNORMAL HIGH (ref 4.0–10.5)
nRBC: 0 % (ref 0.0–0.2)

## 2024-05-30 LAB — BASIC METABOLIC PANEL WITH GFR
Anion gap: 10 (ref 5–15)
BUN: 13 mg/dL (ref 8–23)
CO2: 21 mmol/L — ABNORMAL LOW (ref 22–32)
Calcium: 8.9 mg/dL (ref 8.9–10.3)
Chloride: 100 mmol/L (ref 98–111)
Creatinine, Ser: 1.22 mg/dL — ABNORMAL HIGH (ref 0.44–1.00)
GFR, Estimated: 45 mL/min — ABNORMAL LOW (ref >60)
Glucose, Bld: 88 mg/dL (ref 70–99)
Potassium: 3.7 mmol/L (ref 3.5–5.1)
Sodium: 131 mmol/L — ABNORMAL LOW (ref 135–145)

## 2024-05-30 LAB — MAGNESIUM: Magnesium: 1.4 mg/dL — ABNORMAL LOW (ref 1.7–2.4)

## 2024-05-30 MED ORDER — MAGNESIUM SULFATE 4 GM/100ML IV SOLN
4.0000 g | Freq: Once | INTRAVENOUS | Status: AC
  Administered 2024-05-30: 4 g via INTRAVENOUS
  Filled 2024-05-30: qty 100

## 2024-05-30 MED ORDER — POLYETHYLENE GLYCOL 3350 17 G PO PACK
17.0000 g | PACK | Freq: Every day | ORAL | Status: DC | PRN
  Administered 2024-05-30: 17 g via ORAL
  Filled 2024-05-30: qty 1

## 2024-05-30 NOTE — Plan of Care (Signed)

## 2024-05-30 NOTE — TOC Progression Note (Addendum)
 Transition of Care [facility]) - Progression Note    Patient Details  Name: Cindy Huerta MRN: [id number] Date of Birth: 03/03/1946  Transition of Care [facility]) CM/SW Contact  Eashan Schipani A [place], LCSW Phone Number: 05/30/2024, 12:26 PM  Clinical Narrative:     Update 1645: Pt's auth still pending for rehab.    CSW informed that pt was medically stable for discharge. CSW started insurance authorization, authorization pending.   Mayme Barrows PI#[id number]   CSW informed by [place] [facility] that pt's bed is available Thursday at the earliest, if auth approved, able to DC then. Provider notified.    TOC will continue to follow.        Expected Discharge Plan and Services         Expected Discharge Date: 05/30/24                                     Social Determinants of Health (SDOH) Interventions SDOH Screenings   Food Insecurity: No Food Insecurity (05/25/2024)  Housing: Low Risk  (05/25/2024)  Transportation Needs: No Transportation Needs (05/25/2024)  Utilities: Not At Risk (05/25/2024)  Financial Resource Strain: Patient Declined (12/29/2022)   Received from [facility]  Social Connections: Socially Isolated (05/25/2024)  Tobacco Use: Low Risk  (05/24/2024)    Readmission Risk Interventions    05/01/2024   12:59 PM 09/13/2023   10:08 AM 06/20/2022    1:01 PM  Readmission Risk Prevention Plan  Transportation Screening  Complete   PCP or Specialist Appt within 3-5 Days  Complete Complete  HRI or Home Care Consult  Complete   Social Work Consult for Recovery Care Planning/Counseling  Complete Complete  Palliative Care Screening  Not Applicable Not Applicable  Medication Review Oceanographer) Complete Not Complete   Med Review Comments  Patient's son will speak with nurses upon discharge   PCP or Specialist appointment within 3-5 days of discharge Complete    HRI or Home Care Consult Complete    SW Recovery Care/Counseling Consult Complete     Palliative Care Screening Not Applicable    Skilled Nursing Facility Patient Refused

## 2024-05-30 NOTE — Progress Notes (Signed)
 Mobility Specialist: Progress Note   05/30/24 1536  Mobility  Activity Ambulated with assistance in hallway  Level of Assistance Contact guard assist, steadying assist  Assistive Device Front wheel walker  Distance Ambulated (ft) 300 ft  Activity Response Tolerated well  Mobility Referral Yes  Mobility visit 1 Mobility  Mobility Specialist Start Time (ACUTE ONLY) 1510  Mobility Specialist Stop Time (ACUTE ONLY) 1525  Mobility Specialist Time Calculation (min) (ACUTE ONLY) 15 min    Pt received in chair, eager for additional mobility session. CG throughout. No complaints. Returned to room without fault. Left in bed with all needs met, call bell in reach.   Cindy Huerta Mobility Specialist Please contact via SecureChat or Rehab office at [phone]

## 2024-05-30 NOTE — Care Management Important Message (Signed)
 Important Message  Patient Details  Name: Cindy Huerta MRN: [id number] Date of Birth: 12/28/1945   Important Message Given:  Yes - Medicare IM     Claretta Deed 05/30/2024, 3:52 PM

## 2024-05-30 NOTE — Discharge Summary (Signed)
 Physician Discharge Summary  Cindy Huerta FMW:[id number] DOB: [birth year] DOA: 05/24/2024  PCP: Glover [name], MD  Admit date: 05/24/2024 Discharge date: 05/30/2024 30 Day Unplanned Readmission Risk Score    Flowsheet Row ED to Hosp-Admission (Current) from 05/24/2024 in [place] [facility]  30 Day Unplanned Readmission Risk Score (%) 31.06 Filed at 05/30/2024 0801    This score is the patient's risk of an unplanned readmission within 30 days of being discharged (0 -100%). The score is based on dignosis, age, lab data, medications, orders, and past utilization.   Low:  0-14.9   Medium: 15-21.9   High: 22-29.9   Extreme: 30 and above          Admitted From: Home Disposition: SNF  Recommendations for Outpatient Follow-up:  Follow up with PCP in 1-2 weeks Please obtain BMP/CBC in one week Please follow up with your PCP on the following pending results: Unresulted Labs (From admission, onward)     Start     Ordered   05/27/24 0500  Basic metabolic panel with GFR  Daily,   R     Question:  Specimen collection method  Answer:  Lab=Lab collect   05/26/24 1530   05/27/24 0500  CBC with Differential/Platelet  Daily,   R     Question:  Specimen collection method  Answer:  Lab=Lab collect   05/26/24 1530   05/27/24 0500  Magnesium   Daily,   R     Question:  Specimen collection method  Answer:  Lab=Lab collect   05/26/24 1530              Home Health: None Equipment/Devices: None  Discharge Condition: Stable CODE STATUS: DNR Diet recommendation: Cardiac  Subjective: Seen and examined, she has no complaints, she says  when am I going to go to rehab   Brief/Interim Summary: Cindy Huerta is an 78 yo female with PMH dementia, HTN, HLD, CHF, depression, GERD, CAD, hypothyroidism who presented with nausea and vomiting. There was also some reported abdominal pain but this seemed to have started after she developed nausea and vomiting. No urinary symptoms such as burning,  frequency, or discomfort during urination. No cough, shortness of breath, or fever.   Patient had been seen in the hospital on 5/25-5/28 with similar intractable nausea and vomiting, found to have leukocytosis, hypertensive urgency, AKI, and hyponatremia which resolved with supportive care.  Imaging studies have been done but found no acute abnormalities.     CT abdomen/pelvis this hospitalization showed diverticulosis otherwise no acute findings.  CXR also unremarkable.  Urinalysis negative for signs of infection.  She was started on fluids and admitted for further workup.  Details below.  * Nausea & vomiting-resolved as of 05/28/2024 - Workup has been negative.  CT abdomen/pelvis reassuring with no acute findings.  Denies any diarrhea -Suspecting most likely a viral syndrome; sounds like also some dizziness may be contributing to her vomiting - MRI brain reassuring as negative for stroke as well -Has tolerated diet advancement   Dizziness - No more dizziness today. - MRI brain obtained on 05/28/2024 for thoroughness.  Negative for acute stroke.  Seen by PT OT, they recommended SNF, she is being discharged to SNF today.   Hypertensive urgency-resolved as of 05/27/2024 Continue following medications.   Leukocytosis - Suspected reactive with possible hemoconcentration and/or viral illness - Improving.  She is afebrile.   Chronic diastolic CHF (congestive heart failure) (HCC) - No signs/symptoms of exacerbation - Last echo 08/09/2019 4:  EF 60 to 65%, no RWMA, indeterminate diastolic parameters -Monitor while on fluids   Hyponatremia - Sodium 125 on admission.  Now up to 131.  Suspected hypovolemic hyponatremia due to nausea vomiting.   Lactic acidosis-resolved as of 05/27/2024 - Likely combination of volume depletion/dehydration with viral illness   Elevated troponin - No chest pains and flat trend   QT prolongation - Monitoring electrolytes   Hypomagnesemia - Low again, will be  replenished before discharge today.   Dementia (HCC) - At baseline - Continue delirium precautions   CKD (chronic kidney disease), stage IIIa - patient has history of CKD3a. Baseline creat ~ 1.2, eGFR~ 50-55   Depression with anxiety - No home meds noted   Hypothyroidism - Likely sick euthyroid -TSH elevated, 6.258.  Free T4 also mildly elevated, 1.25 -Continue Synthroid  - Could consider repeating thyroid  studies in about 4 weeks   History of TIA (transient ischemic attack) - Continue aspirin , statin    Discharge plan was discussed with patient and/or family member and they verbalized understanding and agreed with it.  Discharge Diagnoses:  Active Problems:   Dizziness   Hyponatremia   Chronic diastolic CHF (congestive heart failure) (HCC)   Leukocytosis   Elevated troponin   QT prolongation   Hypomagnesemia   Dementia (HCC)   CKD (chronic kidney disease), stage IIIa   Depression with anxiety   Hypothyroidism   History of TIA (transient ischemic attack)    Discharge Instructions  Discharge Instructions     Diet general   Complete by: As directed    Increase activity slowly   Complete by: As directed       Allergies as of 05/30/2024       Reactions   Latex Rash   Novocain [procaine] Other (See Comments)   Unsure - told by DDS not to let anyone give it to her  Confusion   Zestril [lisinopril] Cough   Benadryl  [diphenhydramine ] Other (See Comments)   Jitteriness Agitation   Biaxin [clarithromycin] Other (See Comments)   Confusion   Roxicodone [oxycodone] Nausea And Vomiting, Anxiety        Medication List     STOP taking these medications    triamterene-hydrochlorothiazide 37.5-25 MG tablet Commonly known as: MAXZIDE-25       TAKE these medications    acetaminophen  500 MG tablet Commonly known as: TYLENOL  Take 500-1,000 mg by mouth daily as needed for moderate pain (pain score 4-6) or mild pain (pain score 1-3).   amLODipine  10 MG  tablet Commonly known as: NORVASC  Take 1 tablet (10 mg total) by mouth daily.   Artificial Tears 0.1-0.3 % Soln Generic drug: Dextran 70-Hypromellose Place 1 drop into both eyes daily as needed for dry eyes.   aspirin  EC 81 MG tablet Take 1 tablet (81 mg total) by mouth daily. RESTART 48HRS AFTER DISCHARGE What changed:  when to take this additional instructions   atorvastatin  80 MG tablet Commonly known as: LIPITOR  Take 80 mg by mouth daily.   cloNIDine  0.1 MG tablet Commonly known as: CATAPRES  Take 0.1 mg by mouth daily.   ferrous sulfate  325 (65 FE) MG EC tablet Take 325 mg by mouth at bedtime.   FLUoxetine  10 MG capsule Commonly known as: PROZAC  Take 1 capsule (10 mg total) by mouth daily.   fluticasone  50 MCG/ACT nasal spray Commonly known as: FLONASE  Place 1 spray into both nostrils daily as needed for allergies or rhinitis.   hydrALAZINE  25 MG tablet Commonly known as: APRESOLINE  Take  3 tablets (75 mg total) by mouth 3 (three) times daily. What changed:  medication strength how much to take   levothyroxine  50 MCG tablet Commonly known as: SYNTHROID  Take 50 mcg by mouth daily before breakfast.   LORazepam  0.5 MG tablet Commonly known as: ATIVAN  Take 0.5 mg by mouth daily as needed (shaking).   magnesium  oxide 400 MG tablet Commonly known as: MAG-OX Take 1 tablet (400 mg total) by mouth daily. What changed: when to take this   meclizine  12.5 MG tablet Commonly known as: ANTIVERT  Take 1 tablet (12.5 mg total) by mouth 2 (two) times daily as needed for dizziness. What changed: when to take this   metoprolol  succinate 50 MG 24 hr tablet Commonly known as: TOPROL -XL Take 50 mg by mouth 2 (two) times daily.   pantoprazole  40 MG tablet Commonly known as: PROTONIX  Take 1 tablet (40 mg total) by mouth daily.   telmisartan  80 MG tablet Commonly known as: MICARDIS  Take 80 mg by mouth daily.   trolamine salicylate 10 % cream Commonly known as:  ASPERCREME Apply 1 Application topically as needed for muscle pain.   VITAMIN D -3 PO Take 1 capsule by mouth at bedtime.        Contact information for follow-up providers     Glover [name], MD. Schedule an appointment as soon as possible for a visit in 1 week(s).   Specialty: Family Medicine Contact information: [address] [place] [address] [phone]              Contact information for after-discharge care     Destination     [facility] .   Service: Skilled Nursing Contact information: [address] [place]  [address] [phone]                    Allergies  Allergen Reactions   Latex Rash   Novocain [Procaine] Other (See Comments)    Unsure - told by DDS not to let anyone give it to her  Confusion    Zestril [Lisinopril] Cough   Benadryl  [Diphenhydramine ] Other (See Comments)    Jitteriness Agitation   Biaxin [Clarithromycin] Other (See Comments)    Confusion     Roxicodone [Oxycodone] Nausea And Vomiting and Anxiety    Consultations: None   Procedures/Studies: MR BRAIN WO CONTRAST Result Date: 05/28/2024 CLINICAL DATA:  persistent dizziness; rule out cerebellar cva EXAM: MRI HEAD WITHOUT CONTRAST TECHNIQUE: Multiplanar, multiecho pulse sequences of the brain and surrounding structures were obtained without intravenous contrast. COMPARISON:  CT of the head dated Apr 29, 2024. FINDINGS: Brain: Age-related cerebral and cerebellar volume loss and moderately advanced diffuse cerebral white matter disease. No evidence of hemorrhage, mass, acute cortical infarct or hydrocephalus. Vascular: Normal flow voids. Skull and upper cervical spine: Normal marrow signal. No osseous lesions present. Sinuses/Orbits: Normal. Other: None. IMPRESSION: 1. Moderately advanced diffuse cerebral white matter disease. No apparent acute process. Electronically Signed   By: Evalene Coho M.D.   On: 05/28/2024 15:00   DG Chest  Portable 1 View Result Date: 05/24/2024 EXAM: 1 VIEW XRAY OF THE CHEST 05/24/2024 11:45:00 PM COMPARISON: 04/29/2024 CLINICAL HISTORY: Eval for PNA. Patient actively throwing up. FINDINGS: LUNGS AND PLEURA: No focal pulmonary opacity. No pulmonary edema. No pleural effusion. No pneumothorax. HEART AND MEDIASTINUM: No acute abnormality of the cardiac and mediastinal silhouettes. BONES AND SOFT TISSUES: No acute osseous abnormality. IMPRESSION: 1. No acute process. Electronically signed by: Pinkie Pebbles MD 05/24/2024 11:48 PM  EDT RP Workstation: [id number]   CT ABDOMEN PELVIS WO CONTRAST Result Date: 05/24/2024 EXAM: CT ABDOMEN AND PELVIS WITHOUT CONTRAST 05/24/2024 11:00:33 PM TECHNIQUE: CT of the abdomen and pelvis was performed without the administration of intravenous contrast. Multiplanar reformatted images are provided for review. Automated exposure control, iterative reconstruction, and/or weight based adjustment of the mA/kV was utilized to reduce the radiation dose to as low as reasonably achievable. COMPARISON: 04/29/2024 CLINICAL HISTORY: Abdominal pain, acute, nonlocalized. No contrast. Patient arrives [facility] from home for headache and n/v x1 day. Hx of the same and sent to hospital for treatment in the past. Patient also started to complain of chest en route to hospital. History of dementia and at baseline according to family. FINDINGS: LOWER CHEST: Small hiatal hernia. LIVER: The liver is unremarkable. GALLBLADDER AND BILE DUCTS: Status post cholecystectomy. No biliary ductal dilatation. SPLEEN: No acute abnormality. PANCREAS: No acute abnormality. ADRENAL GLANDS: No acute abnormality. KIDNEYS, URETERS AND BLADDER: No stones in the kidneys or ureters. No hydronephrosis. Mild right perinephric fluid/stranding, chronic. Urinary bladder is unremarkable. GI AND BOWEL: Left colonic diverticulosis, without evidence of diverticulitis. The appendix is not completely visualized, possibly surgically absent.  No bowel obstruction. No bowel wall thickening. PERITONEUM AND RETROPERITONEUM: No ascites. No free air. VASCULATURE: Atherosclerotic calcifications of the abdominal aorta and branch vessels. LYMPH NODES: No lymphadenopathy. REPRODUCTIVE ORGANS: Status post hysterectomy. BONES AND SOFT TISSUES: No acute osseous abnormality. No focal soft tissue abnormality. IMPRESSION: 1. No acute findings. 2. Left colonic diverticulosis, without evidence of diverticulitis. Electronically signed by: Pinkie Pebbles MD 05/24/2024 11:05 PM EDT RP Workstation: [id number]     Discharge Exam: Vitals:   05/30/24 0429 05/30/24 0811  BP: (!) 160/78 (!) 144/68  Pulse: 78 65  Resp: 18 19  Temp: 98 F (36.7 C) 97.8 F (36.6 C)  SpO2: 100% 100%   Vitals:   05/29/24 2033 05/30/24 0429 05/30/24 0500 05/30/24 0811  BP: (!) 158/73 (!) 160/78  (!) 144/68  Pulse: 70 78  65  Resp: 17 18  19   Temp: 98.5 F (36.9 C) 98 F (36.7 C)  97.8 F (36.6 C)  TempSrc:  Oral    SpO2: 100% 100%  100%  Weight:   75 kg   Height:        General: Pt is alert, awake, not in acute distress Cardiovascular: RRR, S1/S2 +, no rubs, no gallops Respiratory: CTA bilaterally, no wheezing, no rhonchi Abdominal: Soft, NT, ND, bowel sounds + Extremities: no edema, no cyanosis    The results of significant diagnostics from this hospitalization (including imaging, microbiology, ancillary and laboratory) are listed below for reference.     Microbiology: No results found for this or any previous visit (from the past 240 hours).   Labs: BNP (last 3 results) Recent Labs    08/09/23 0615  BNP 76.7   Basic Metabolic Panel: Recent Labs  Lab 05/24/24 2125 05/25/24 1539 05/26/24 1729 05/27/24 0441 05/28/24 0425 05/29/24 0424 05/30/24 0309  NA 125*   < > 131* 130* 131* 129* 131*  K 3.5   < > 3.7 4.0 3.6 3.3* 3.7  CL 85*   < > 100 105 103 99 100  CO2 20*   < > 22 20* 19* 24 21*  GLUCOSE 197*   < > 108* 91 96 98 88  BUN 8   < > 8  7* 8 11 13   CREATININE 1.29*   < > 1.00 1.07* 0.94 1.12* 1.22*  CALCIUM  10.2   < >  9.0 8.4* 8.9 8.8* 8.9  MG 1.4*  --   --  1.8 1.5* 1.7 1.4*   < > = values in this interval not displayed.   Liver Function Tests: Recent Labs  Lab 05/24/24 2125 05/25/24 1539  AST 47* 46*  ALT 23 24  ALKPHOS 57 56  BILITOT 0.9 0.8  PROT 8.5* 8.2*  ALBUMIN 4.1 3.9   Recent Labs  Lab 05/24/24 2125  LIPASE 21   No results for input(s): AMMONIA in the last 168 hours. CBC: Recent Labs  Lab 05/24/24 2125 05/25/24 1539 05/26/24 0615 05/27/24 0441 05/28/24 0425 05/29/24 0424 05/30/24 0309  WBC 18.8*   < > 15.5* 12.5* 14.7* 13.4* 12.0*  NEUTROABS 16.9*  --   --  8.1* 10.6* 9.1* 7.6  HGB 13.1   < > 12.3 11.3* 11.6* 11.2* 10.7*  HCT 37.0   < > 34.4* 32.7* 34.7* 32.4* 31.4*  MCV 87.1   < > 87.3 90.1 91.8 89.5 90.8  PLT 270   < > 244 210 203 232 211   < > = values in this interval not displayed.   Cardiac Enzymes: No results for input(s): CKTOTAL, CKMB, CKMBINDEX, TROPONINI in the last 168 hours. BNP: Invalid input(s): POCBNP CBG: No results for input(s): GLUCAP in the last 168 hours. D-Dimer No results for input(s): DDIMER in the last 72 hours. Hgb A1c No results for input(s): HGBA1C in the last 72 hours. Lipid Profile No results for input(s): CHOL, HDL, LDLCALC, TRIG, CHOLHDL, LDLDIRECT in the last 72 hours. Thyroid  function studies No results for input(s): TSH, T4TOTAL, T3FREE, THYROIDAB in the last 72 hours.  Invalid input(s): FREET3 Anemia work up No results for input(s): VITAMINB12, FOLATE, FERRITIN, TIBC, IRON , RETICCTPCT in the last 72 hours. Urinalysis    Component Value Date/Time   COLORURINE YELLOW 05/25/2024 0031   APPEARANCEUR HAZY (A) 05/25/2024 0031   LABSPEC 1.008 05/25/2024 0031   PHURINE 8.0 05/25/2024 0031   GLUCOSEU NEGATIVE 05/25/2024 0031   HGBUR NEGATIVE 05/25/2024 0031   BILIRUBINUR NEGATIVE 05/25/2024  0031   KETONESUR NEGATIVE 05/25/2024 0031   PROTEINUR 100 (A) 05/25/2024 0031   NITRITE NEGATIVE 05/25/2024 0031   LEUKOCYTESUR NEGATIVE 05/25/2024 0031   Sepsis Labs Recent Labs  Lab 05/27/24 0441 05/28/24 0425 05/29/24 0424 05/30/24 0309  WBC 12.5* 14.7* 13.4* 12.0*   Microbiology No results found for this or any previous visit (from the past 240 hours).  FURTHER DISCHARGE INSTRUCTIONS:   Get Medicines reviewed and adjusted: Please take all your medications with you for your next visit with your Primary MD   Laboratory/radiological data: Please request your Primary MD to go over all hospital tests and procedure/radiological results at the follow up, please ask your Primary MD to get all Hospital records sent to his/her office.   In some cases, they will be blood work, cultures and biopsy results pending at the time of your discharge. Please request that your primary care M.D. goes through all the records of your hospital data and follows up on these results.   Also Note the following: If you experience worsening of your admission symptoms, develop shortness of breath, life threatening emergency, suicidal or homicidal thoughts you must seek medical attention immediately by calling 911 or calling your MD immediately  if symptoms less severe.   You must read complete instructions/literature along with all the possible adverse reactions/side effects for all the Medicines you take and that have been prescribed to you. Take any new Medicines after you  have completely understood and accpet all the possible adverse reactions/side effects.    patient was instructed, not to drive, operate heavy machinery, perform activities at heights, swimming or participation in water  activities or provide baby-sitting services while on Pain, Sleep and Anxiety Medications; until their outpatient Physician has advised to do so again. Also recommended to not to take more than prescribed Pain, Sleep and  Anxiety Medications.  It is not advisable to combine anxiety, sleep and pain medications without talking with your primary care provider.     Wear Seat belts while driving.   Please note: You were cared for by a hospitalist during your hospital stay. Once you are discharged, your primary care physician will handle any further medical issues. Please note that NO REFILLS for any discharge medications will be authorized once you are discharged, as it is imperative that you return to your primary care physician (or establish a relationship with a primary care physician if you do not have one) for your post hospital discharge needs so that they can reassess your need for medications and monitor your lab values  Time coordinating discharge: Over 30 minutes  SIGNED:   Fredia Skeeter, MD  [facility] 05/30/2024, 10:37 AM *Please note that this is a verbal dictation therefore any spelling or grammatical errors are due to the Dragon Medical One system interpretation. If 7PM-7AM, please contact night-coverage [email or web address]

## 2024-05-30 NOTE — Progress Notes (Signed)
 Mobility Specialist: Progress Note   05/30/24 1336  Mobility  Activity Ambulated with assistance in hallway  Level of Assistance Contact guard assist, steadying assist  Assistive Device Front wheel walker  Distance Ambulated (ft) 200 ft  Activity Response Tolerated well  Mobility Referral Yes  Mobility visit 1 Mobility  Mobility Specialist Start Time (ACUTE ONLY) 0920  Mobility Specialist Stop Time (ACUTE ONLY) 0944  Mobility Specialist Time Calculation (min) (ACUTE ONLY) 24 min    Pt received in bed, agreeable to mobility session. CG throughout. No complaints. Returned to room without fault. Left in chair with all needs met, call bell in reach.   Ileana Lute Mobility Specialist Please contact via SecureChat or Rehab office at [phone]

## 2024-05-31 DIAGNOSIS — R112 Nausea with vomiting, unspecified: Secondary | ICD-10-CM | POA: Diagnosis not present

## 2024-05-31 LAB — CBC WITH DIFFERENTIAL/PLATELET
Abs Immature Granulocytes: 0.12 10*3/uL — ABNORMAL HIGH (ref 0.00–0.07)
Basophils Absolute: 0.1 10*3/uL (ref 0.0–0.1)
Basophils Relative: 1 %
Eosinophils Absolute: 0.5 10*3/uL (ref 0.0–0.5)
Eosinophils Relative: 3 %
HCT: 32.3 % — ABNORMAL LOW (ref 36.0–46.0)
Hemoglobin: 11.1 g/dL — ABNORMAL LOW (ref 12.0–15.0)
Immature Granulocytes: 1 %
Lymphocytes Relative: 20 %
Lymphs Abs: 2.6 10*3/uL (ref 0.7–4.0)
MCH: 30.9 pg (ref 26.0–34.0)
MCHC: 34.4 g/dL (ref 30.0–36.0)
MCV: 90 fL (ref 80.0–100.0)
Monocytes Absolute: 1.4 10*3/uL — ABNORMAL HIGH (ref 0.1–1.0)
Monocytes Relative: 10 %
Neutro Abs: 8.6 10*3/uL — ABNORMAL HIGH (ref 1.7–7.7)
Neutrophils Relative %: 65 %
Platelets: 231 10*3/uL (ref 150–400)
RBC: 3.59 MIL/uL — ABNORMAL LOW (ref 3.87–5.11)
RDW: 13.2 % (ref 11.5–15.5)
WBC: 13.2 10*3/uL — ABNORMAL HIGH (ref 4.0–10.5)
nRBC: 0 % (ref 0.0–0.2)

## 2024-05-31 LAB — BASIC METABOLIC PANEL WITH GFR
Anion gap: 7 (ref 5–15)
BUN: 14 mg/dL (ref 8–23)
CO2: 22 mmol/L (ref 22–32)
Calcium: 8.9 mg/dL (ref 8.9–10.3)
Chloride: 104 mmol/L (ref 98–111)
Creatinine, Ser: 1.18 mg/dL — ABNORMAL HIGH (ref 0.44–1.00)
GFR, Estimated: 47 mL/min — ABNORMAL LOW (ref >60)
Glucose, Bld: 112 mg/dL — ABNORMAL HIGH (ref 70–99)
Potassium: 3.6 mmol/L (ref 3.5–5.1)
Sodium: 133 mmol/L — ABNORMAL LOW (ref 135–145)

## 2024-05-31 LAB — MAGNESIUM: Magnesium: 2 mg/dL (ref 1.7–2.4)

## 2024-05-31 NOTE — Progress Notes (Signed)
 6/26 Peer to Peer was offered and the request was denied. The reason given is the level of function is too high

## 2024-05-31 NOTE — TOC Transition Note (Signed)
 Transition of Care [facility]) - Discharge Note   Patient Details  Name: Cindy Huerta MRN: [id number] Date of Birth: 1946-09-07  Transition [facility]) CM/SW Contact:  Rosaline JONELLE Joe, RN Phone Number: 05/31/2024, 11:59 AM   Clinical Narrative:    CM spoke with MSW and attending physician and patient's insurance declined her for [facility] placement.  Patient is medically stable to discharge and return home.  I called and spoke with the patient's son, Cindy Huerta by phone and he plans to pick the patient up by car today to take her home.  Patient's son requests continued services through [facility] for PT, OT - I added MSW and aide to the [facility] orders to be co-signed by the MD.  OT plans to meet with the patient at the bedside to provide family with gait belt for home.  DME at the home includes shower seat, 3:1, Cane, RW and rolator.  I placed referral to [facility] to assist the son with ALf/family care home placement in the next 1-2 months.  Referral also placed with [facility] - son provided with number to call if he needs extra hours outside of home health.  Son also updated regarding [facility] for seniors.  Son plans to pick the patient up at 3 pm today to take her home by car.         Patient Goals and CMS Choice            Discharge Placement                       Discharge Plan and Services Additional resources added to the After Visit Summary for                                       Social Drivers of Health (SDOH) Interventions SDOH Screenings   Food Insecurity: No Food Insecurity (05/25/2024)  Housing: Low Risk  (05/25/2024)  Transportation Needs: No Transportation Needs (05/25/2024)  Utilities: Not At Risk (05/25/2024)  Financial Resource Strain: Patient Declined (12/29/2022)   Received from [facility]  Social Connections: Socially Isolated (05/25/2024)  Tobacco Use: Low  Risk  (05/24/2024)     Readmission Risk Interventions    05/01/2024   12:59 PM 09/13/2023   10:08 AM 06/20/2022    1:01 PM  Readmission Risk Prevention Plan  Transportation Screening  Complete   PCP or Specialist Appt within 3-5 Days  Complete Complete  HRI or Home Care Consult  Complete   Social Work Consult for Recovery Care Planning/Counseling  Complete Complete  Palliative Care Screening  Not Applicable Not Applicable  Medication Review Oceanographer) Complete Not Complete   Med Review Comments  Patient's son will speak with nurses upon discharge   PCP or Specialist appointment within 3-5 days of discharge Complete    HRI or Home Care Consult Complete    SW Recovery Care/Counseling Consult Complete    Palliative Care Screening Not Applicable    Skilled Nursing Facility Patient Refused

## 2024-05-31 NOTE — TOC Progression Note (Signed)
 Transition of Care [facility]) - Progression Note    Patient Details  Name: Cindy Huerta MRN: [id number] Date of Birth: July 29, 1946  Transition of Care [facility]) CM/SW Contact  Kaila Devries A [place], LCSW Phone Number: 05/31/2024, 9:19 AM  Clinical Narrative:     CSW was notified that insurance is offering p2p for pt. Deadline to complete by 12 noon today. [phone] option #5. Provider notified.    TOC will continue to follow.      Expected Discharge Plan and Services         Expected Discharge Date: 05/30/24                                     Social Determinants of Health (SDOH) Interventions SDOH Screenings   Food Insecurity: No Food Insecurity (05/25/2024)  Housing: Low Risk  (05/25/2024)  Transportation Needs: No Transportation Needs (05/25/2024)  Utilities: Not At Risk (05/25/2024)  Financial Resource Strain: Patient Declined (12/29/2022)   [facility] from [facility]  Social Connections: Socially Isolated (05/25/2024)  Tobacco Use: Low Risk  (05/24/2024)    Readmission Risk Interventions    05/01/2024   12:59 PM 09/13/2023   10:08 AM 06/20/2022    1:01 PM  Readmission Risk Prevention Plan  Transportation Screening  Complete   PCP or Specialist Appt within 3-5 Days  Complete Complete  HRI or Home Care Consult  Complete   Social Work Consult for Recovery Care Planning/Counseling  Complete Complete  Palliative Care Screening  Not Applicable Not Applicable  Medication Review Oceanographer) Complete Not Complete   Med Review Comments  Patient's son will speak with nurses upon discharge   PCP or Specialist appointment within 3-5 days of discharge Complete    HRI or Home Care Consult Complete    SW Recovery Care/Counseling Consult Complete    Palliative Care Screening Not Applicable    Skilled Nursing Facility Patient Refused

## 2024-05-31 NOTE — Progress Notes (Signed)
 Mobility Specialist: Progress Note   05/31/24 1254  Mobility  Activity Ambulated with assistance in hallway  Level of Assistance Contact guard assist, steadying assist  Assistive Device Front wheel walker  Distance Ambulated (ft) 400 ft  Activity Response Tolerated well  Mobility Referral Yes  Mobility visit 1 Mobility  Mobility Specialist Start Time (ACUTE ONLY) 1117  Mobility Specialist Stop Time (ACUTE ONLY) 1133  Mobility Specialist Time Calculation (min) (ACUTE ONLY) 16 min    Received pt in chair having no complaints and agreeable to mobility. Pt was asymptomatic throughout ambulation and returned to room w/o fault. Left in chair w/ call bell in reach and all needs met.   Ileana Lute Mobility Specialist Please contact via SecureChat or Rehab office at [phone]

## 2024-05-31 NOTE — Progress Notes (Addendum)
 Physical Therapy Treatment Patient Details Name: Cindy Huerta MRN: [id number] DOB: [birth year] Today's Date: 05/31/2024   History of Present Illness Pt is a 78 y/o female admitted 05/24/24  with nausea and vomiting, also reporting abdominal pain. Recent admission 04/30/24 with nausea and syncopal episode. PMH includes Arthritis, Breast cancer, Depression, HLD, HTN, and Hypothyroidism; Cholecystectomy; Abdominal hysterectomy (1982); Total mastectomy (Left, 11/20/2021), CKD stage III, history of GI bleed, TIA, and chronic HFpEF.    PT Comments  Pt received in recliner, agreeable to therapy session, requesting assist to bathroom first due to slow nursing staff response to call bell. Pt needing up to CGA for functional mobility tasks, but mostly Supervision today with RW support for safety, no LOB or acute s/sx distress. Pt performed household distance gait trial and stairs x8 to simulate ascending stairwell to second floor at home, no significant difficulty with single UE support. Discussion on pt progress with supervising PT Cyndi W and pt and updated below, pt has support from family at home and son planning to pick her up after work for discharge today, recommend HHPT.     If plan is discharge home, recommend the following: A little help with walking and/or transfers;A little help with bathing/dressing/bathroom;Help with stairs or ramp for entrance;Assist for transportation;Assistance with cooking/housework;Supervision due to cognitive status   Can travel by private vehicle     Yes  Equipment Recommendations  None recommended by PT    Recommendations for Other Services       Precautions / Restrictions Precautions Precautions: Fall Recall of Precautions/Restrictions: Intact Precaution/Restrictions Comments: compliant with call bell use today Restrictions Weight Bearing Restrictions Per Provider Order: No     Mobility  Bed Mobility Overal bed mobility: Needs Assistance              General bed mobility comments: pt received/remains in recliner    Transfers Overall transfer level: Needs assistance Equipment used: Rolling walker (2 wheels) Transfers: Sit to/from Stand Sit to Stand: Supervision           General transfer comment: chair<>RW, low toilet seat<>RW using wall rail    Ambulation/Gait Ambulation/Gait assistance: Supervision Gait Distance (Feet): 30 Feet (61ft, to toilet, then 70ft in room) Assistive device: Rolling walker (2 wheels) Gait Pattern/deviations: Step-through pattern, Decreased stride length       General Gait Details: min cues for RW proximity; distance limited due to need for toileting and only minimal c/o fatigue after stair negotiation   Stairs Stairs: Yes Stairs assistance: Contact guard assist, Supervision Stair Management: One rail Left, Step to pattern, Forwards, Two rails Number of Stairs: 8 General stair comments: 4 steps BUE support with supervision, then 4 steps with only single rail, pt offered UE support for hand opposite rail but did not need HHA on other side, CGA for safety, no buckling.   Wheelchair Mobility     Tilt Bed    Modified Rankin (Stroke Patients Only)       Balance Overall balance assessment: Mild deficits observed, not formally tested             Standing balance comment: RW support-dynamic; standing at sink to wash hands and at toilet for hygiene without LOB unsupported                            Communication Communication Communication: No apparent difficulties  Cognition Arousal: Alert Behavior During Therapy: WFL for tasks assessed/performed, Flat affect   PT -  Cognitive impairments: History of cognitive impairments, No family/caregiver present to determine baseline                       PT - Cognition Comments: Pt mildly skeptical with newer staff member, but with discussion on plan for session, pt cooperative with encouragement. Following commands:  Intact      Cueing Cueing Techniques: Verbal cues  Exercises      General Comments General comments (skin integrity, edema, etc.): no acute s/sx distress or c/o dizziness today      Pertinent Vitals/Pain Pain Assessment Pain Assessment: No/denies pain    Home Living                          Prior Function            PT Goals (current goals can now be found in the care plan section) Acute Rehab PT Goals Patient Stated Goal: Patient looking forward to going home PT Goal Formulation: With patient Time For Goal Achievement: 06/11/24 Progress towards PT goals: Progressing toward goals    Frequency    Min 2X/week      PT Plan      Co-evaluation              AM-PAC PT 6 Clicks Mobility   Outcome Measure  Help needed turning from your back to your side while in a flat bed without using bedrails?: None Help needed moving from lying on your back to sitting on the side of a flat bed without using bedrails?: A Little Help needed moving to and from a bed to a chair (including a wheelchair)?: A Little Help needed standing up from a chair using your arms (e.g., wheelchair or bedside chair)?: A Little Help needed to walk in hospital room?: A Little Help needed climbing 3-5 steps with a railing? : A Little 6 Click Score: 19    End of Session Equipment Utilized During Treatment: Gait belt Activity Tolerance: Patient tolerated treatment well Patient left: in chair;with call bell/phone within reach;with chair alarm set;Other (comment) (NT in room) Nurse Communication: Mobility status PT Visit Diagnosis: Muscle weakness (generalized) (M62.81);Other abnormalities of gait and mobility (R26.89);Dizziness and giddiness (R42)     Time: 8584-8570 PT Time Calculation (min) (ACUTE ONLY): 14 min  Charges:    $Gait Training: 8-22 mins PT General Charges $$ ACUTE PT VISIT: 1 Visit                     Tahmir Kleckner P., PTA Acute Rehabilitation Services Secure Chat  Preferred 9a-5:30pm Office: [phone]    Connell HERO [facility] 05/31/2024, 2:43 PM

## 2024-05-31 NOTE — Progress Notes (Signed)
 PT Cancellation Note  Patient Details Name: Cindy Huerta MRN: [id number] DOB: September 01, 1946   Cancelled Treatment:    Reason Eval/Treat Not Completed: (P) Other (comment) (pt eating lunch, defers PT session to practice stairs until later.) Will continue efforts per PT plan of care as schedule permits.   Cindy Huerta 05/31/2024, 1:46 PM

## 2024-05-31 NOTE — Progress Notes (Signed)
 Mobility Specialist: Progress Note   05/31/24 1253  Mobility  Activity Ambulated with assistance in hallway  Level of Assistance Contact guard assist, steadying assist  Assistive Device Front wheel walker  Distance Ambulated (ft) 300 ft  Activity Response Tolerated well  Mobility Referral Yes  Mobility visit 1 Mobility  Mobility Specialist Start Time (ACUTE ONLY) 0825  Mobility Specialist Stop Time (ACUTE ONLY) 0839  Mobility Specialist Time Calculation (min) (ACUTE ONLY) 14 min    Received pt in bed having no complaints and agreeable to mobility. Pt was asymptomatic throughout ambulation and returned to room w/o fault. Left in chair w/ call bell in reach and all needs met.   Ileana Lute Mobility Specialist Please contact via SecureChat or Rehab office at [phone]

## 2024-05-31 NOTE — Discharge Summary (Signed)
 Physician Discharge Summary  Cindy Huerta FMW:[id number] DOB: 08/05/1946 DOA: 05/24/2024  PCP: Cindy [name], MD  Admit date: 05/24/2024 Discharge date: 05/31/2024 30 Day Unplanned Readmission Risk Score    Flowsheet Row ED to Hosp-Admission (Current) from 05/24/2024 in [place] [facility]  30 Day Unplanned Readmission Risk Score (%) 31.06 Filed at 05/30/2024 0801    This score is the patient's risk of an unplanned readmission within 30 days of being discharged (0 -100%). The score is based on dignosis, age, lab data, medications, orders, and past utilization.   Low:  0-14.9   Medium: 15-21.9   High: 22-29.9   Extreme: 30 and above          Admitted From: Home Disposition: Home  Recommendations for Outpatient Follow-up:  Follow up with PCP in 1-2 weeks Please obtain BMP/CBC in one week Please follow up with your PCP on the following pending results: Unresulted Labs (From admission, onward)    None         Home Health: None Equipment/Devices: None  Discharge Condition: Stable CODE STATUS: DNR Diet recommendation: Cardiac  Subjective: Seen and examined, she has no complaints, she says  when am I going to go to rehab   Brief/Interim Summary: Cindy Huerta is an 78 yo female with PMH dementia, HTN, HLD, CHF, depression, GERD, CAD, hypothyroidism who presented with nausea and vomiting. There was also some reported abdominal pain but this seemed to have started after she developed nausea and vomiting. No urinary symptoms such as burning, frequency, or discomfort during urination. No cough, shortness of breath, or fever.   Patient had been seen in the hospital on 5/25-5/28 with similar intractable nausea and vomiting, found to have leukocytosis, hypertensive urgency, AKI, and hyponatremia which resolved with supportive care.  Imaging studies have been done but found no acute abnormalities.     CT abdomen/pelvis this hospitalization showed diverticulosis otherwise no  acute findings.  CXR also unremarkable.  Urinalysis negative for signs of infection.  She was started on fluids and admitted for further workup.  Details below.  * Nausea & vomiting-resolved as of 05/28/2024 - Workup has been negative.  CT abdomen/pelvis reassuring with no acute findings.  Denies any diarrhea -Suspecting most likely a viral syndrome; sounds like also some dizziness may be contributing to her vomiting - MRI brain reassuring as negative for stroke as well -Has tolerated diet advancement   Dizziness - No more dizziness today. - MRI brain obtained on 05/28/2024 for thoroughness.  Negative for acute stroke.  Seen by PT OT, they recommended SNF, she is being discharged to SNF today.   Hypertensive urgency-resolved as of 05/27/2024 Continue following medications.   Leukocytosis - Suspected reactive with possible hemoconcentration and/or viral illness - Improving.  She is afebrile.   Chronic diastolic CHF (congestive heart failure) (HCC) - No signs/symptoms of exacerbation - Last echo 08/09/2019 4: EF 60 to 65%, no RWMA, indeterminate diastolic parameters -Monitor while on fluids   Hyponatremia - Sodium 125 on admission.  Now up to 131.  Suspected hypovolemic hyponatremia due to nausea vomiting.   Lactic acidosis-resolved as of 05/27/2024 - Likely combination of volume depletion/dehydration with viral illness   Elevated troponin - No chest pains and flat trend   QT prolongation - Monitoring electrolytes   Hypomagnesemia - Low again, will be replenished before discharge today.   Dementia (HCC) - At baseline - Continue delirium precautions   CKD (chronic kidney disease), stage IIIa - patient has history of  [id number]. Baseline creat ~ 1.2, eGFR~ 50-55   Depression with anxiety - No home meds noted   Hypothyroidism - Likely sick euthyroid -TSH elevated, 6.258.  Free T4 also mildly elevated, 1.25 -Continue Synthroid  - Could consider repeating thyroid  studies in about 4  weeks   History of TIA (transient ischemic attack) - Continue aspirin , statin   Disposition: PT OT recommended SNF, insurance authorization was pending yesterday however in anticipation of receiving that, I prepared the discharge yesterday on 05/30/2024 however I was informed today that patient's insurance was denied and peer-to-peer was requested.  I did peer to peer with insurance company, provided all the required data, peer to peer was denied as well.  TOC spoke to the family, they are okay with taking the patient back home with home health.  Patient being discharged to home today instead of SNF.  Patient was seen and examined today.  No changes compared to yesterday.  No complaints.  She is as stable as she was yesterday.  Discharge plan was discussed with patient and/or family member and they verbalized understanding and agreed with it.  Discharge Diagnoses:  Active Problems:   Dizziness   Hyponatremia   Chronic diastolic CHF (congestive heart failure) (HCC)   Leukocytosis   Elevated troponin   QT prolongation   Hypomagnesemia   Dementia (HCC)   CKD (chronic kidney disease), stage IIIa   Depression with anxiety   Hypothyroidism   History of TIA (transient ischemic attack)    Discharge Instructions  Discharge Instructions     Diet general   Complete by: As directed    Increase activity slowly   Complete by: As directed       Allergies as of 05/31/2024       Reactions   Latex Rash   Novocain [procaine] Other (See Comments)   Unsure - told by DDS not to let anyone give it to her  Confusion   Zestril [lisinopril] Cough   Benadryl  [diphenhydramine ] Other (See Comments)   Jitteriness Agitation   Biaxin [clarithromycin] Other (See Comments)   Confusion   Roxicodone [oxycodone] Nausea And Vomiting, Anxiety        Medication List     STOP taking these medications    triamterene-hydrochlorothiazide 37.5-25 MG tablet Commonly known as: MAXZIDE-25       TAKE  these medications    acetaminophen  500 MG tablet Commonly known as: TYLENOL  Take 500-1,000 mg by mouth daily as needed for moderate pain (pain score 4-6) or mild pain (pain score 1-3).   amLODipine  10 MG tablet Commonly known as: NORVASC  Take 1 tablet (10 mg total) by mouth daily.   Artificial Tears 0.1-0.3 % Soln Generic drug: Dextran 70-Hypromellose Place 1 drop into both eyes daily as needed for dry eyes.   aspirin  EC 81 MG tablet Take 1 tablet (81 mg total) by mouth daily. RESTART 48HRS AFTER DISCHARGE What changed:  when to take this additional instructions   atorvastatin  80 MG tablet Commonly known as: LIPITOR  Take 80 mg by mouth daily.   cloNIDine  0.1 MG tablet Commonly known as: CATAPRES  Take 0.1 mg by mouth daily.   ferrous sulfate  325 (65 FE) MG EC tablet Take 325 mg by mouth at bedtime.   FLUoxetine  10 MG capsule Commonly known as: PROZAC  Take 1 capsule (10 mg total) by mouth daily.   fluticasone  50 MCG/ACT nasal spray Commonly known as: FLONASE  Place 1 spray into both nostrils daily as needed for allergies or rhinitis.   hydrALAZINE  25  MG tablet Commonly known as: APRESOLINE  Take 3 tablets (75 mg total) by mouth 3 (three) times daily. What changed:  medication strength how much to take   levothyroxine  50 MCG tablet Commonly known as: SYNTHROID  Take 50 mcg by mouth daily before breakfast.   LORazepam  0.5 MG tablet Commonly known as: ATIVAN  Take 0.5 mg by mouth daily as needed (shaking).   magnesium  oxide 400 MG tablet Commonly known as: MAG-OX Take 1 tablet (400 mg total) by mouth daily. What changed: when to take this   meclizine  12.5 MG tablet Commonly known as: ANTIVERT  Take 1 tablet (12.5 mg total) by mouth 2 (two) times daily as needed for dizziness. What changed: when to take this   metoprolol  succinate 50 MG 24 hr tablet Commonly known as: TOPROL -XL Take 50 mg by mouth 2 (two) times daily.   pantoprazole  40 MG tablet Commonly  known as: PROTONIX  Take 1 tablet (40 mg total) by mouth daily.   telmisartan  80 MG tablet Commonly known as: MICARDIS  Take 80 mg by mouth daily.   trolamine salicylate 10 % cream Commonly known as: ASPERCREME Apply 1 Application topically as needed for muscle pain.   VITAMIN D -3 PO Take 1 capsule by mouth at bedtime.         Contact information for follow-up providers     Cindy [name], MD. Schedule an appointment as soon as possible for a visit in 1 week(s).   Specialty: Family Medicine Contact information: [address] [place] [address] [phone]         [facility] Follow up.   Specialty: Home Health Services Why: [facility] will provide home health services.  They will call you in the next 24 hours to set up services for Pt, OT, MSW and aide. Contact information: [address] [place] [address] [phone]         [facility] Follow up.   Why: Please call [facility] to receive assistance with ALF versus family care home placement. Contact information: [phone]             Contact information for after-discharge care     Destination     [facility] .   Service: Skilled Nursing Contact information: [address] [place]   [phone]                    Allergies  Allergen Reactions   Latex Rash   Novocain [Procaine] Other (See Comments)    Unsure - told by DDS not to let anyone give it to her  Confusion    Zestril [Lisinopril] Cough   Benadryl  [Diphenhydramine ] Other (See Comments)    Jitteriness Agitation   Biaxin [Clarithromycin] Other (See Comments)    Confusion     Roxicodone [Oxycodone] Nausea And Vomiting and Anxiety    Consultations: None   Procedures/Studies: MR BRAIN WO CONTRAST Result Date: 05/28/2024 CLINICAL DATA:  persistent dizziness; rule out cerebellar cva EXAM: MRI HEAD WITHOUT CONTRAST TECHNIQUE:  Multiplanar, multiecho pulse sequences of the brain and surrounding structures were obtained without intravenous contrast. COMPARISON:  CT of the head dated Apr 29, 2024. FINDINGS: Brain: Age-related cerebral and cerebellar volume loss and moderately advanced diffuse cerebral white matter disease. No evidence of hemorrhage, mass, acute cortical infarct or hydrocephalus. Vascular: Normal flow voids. Skull and upper cervical spine: Normal marrow signal. No osseous lesions present. Sinuses/Orbits: Normal. Other: None. IMPRESSION: 1. Moderately advanced diffuse cerebral white  matter disease. No apparent acute process. Electronically Signed   By: Evalene Coho M.D.   On: 05/28/2024 15:00   DG Chest Portable 1 View Result Date: 05/24/2024 EXAM: 1 VIEW XRAY OF THE CHEST 05/24/2024 11:45:00 PM COMPARISON: 04/29/2024 CLINICAL HISTORY: Eval for PNA. Patient actively throwing up. FINDINGS: LUNGS AND PLEURA: No focal pulmonary opacity. No pulmonary edema. No pleural effusion. No pneumothorax. HEART AND MEDIASTINUM: No acute abnormality of the cardiac and mediastinal silhouettes. BONES AND SOFT TISSUES: No acute osseous abnormality. IMPRESSION: 1. No acute process. Electronically signed by: Pinkie Pebbles MD 05/24/2024 11:48 PM EDT RP Workstation: [id number]   CT ABDOMEN PELVIS WO CONTRAST Result Date: 05/24/2024 EXAM: CT ABDOMEN AND PELVIS WITHOUT CONTRAST 05/24/2024 11:00:33 PM TECHNIQUE: CT of the abdomen and pelvis was performed without the administration of intravenous contrast. Multiplanar reformatted images are provided for review. Automated exposure control, iterative reconstruction, and/or weight based adjustment of the mA/kV was utilized to reduce the radiation dose to as low as reasonably achievable. COMPARISON: 04/29/2024 CLINICAL HISTORY: Abdominal pain, acute, nonlocalized. No contrast. Patient arrives [facility] from home for headache and n/v x1 day. Hx of the same and sent to hospital for treatment in the  past. Patient also started to complain of chest en route to hospital. History of dementia and at baseline according to family. FINDINGS: LOWER CHEST: Small hiatal hernia. LIVER: The liver is unremarkable. GALLBLADDER AND BILE DUCTS: Status post cholecystectomy. No biliary ductal dilatation. SPLEEN: No acute abnormality. PANCREAS: No acute abnormality. ADRENAL GLANDS: No acute abnormality. KIDNEYS, URETERS AND BLADDER: No stones in the kidneys or ureters. No hydronephrosis. Mild right perinephric fluid/stranding, chronic. Urinary bladder is unremarkable. GI AND BOWEL: Left colonic diverticulosis, without evidence of diverticulitis. The appendix is not completely visualized, possibly surgically absent. No bowel obstruction. No bowel wall thickening. PERITONEUM AND RETROPERITONEUM: No ascites. No free air. VASCULATURE: Atherosclerotic calcifications of the abdominal aorta and branch vessels. LYMPH NODES: No lymphadenopathy. REPRODUCTIVE ORGANS: Status post hysterectomy. BONES AND SOFT TISSUES: No acute osseous abnormality. No focal soft tissue abnormality. IMPRESSION: 1. No acute findings. 2. Left colonic diverticulosis, without evidence of diverticulitis. Electronically signed by: Pinkie Pebbles MD 05/24/2024 11:05 PM EDT RP Workstation: [id number]     Discharge Exam: Vitals:   05/30/24 1633 05/31/24 0458  BP: 139/64 132/69  Pulse: 67 66  Resp: 19 17  Temp: 97.7 F (36.5 C) 98.2 F (36.8 C)  SpO2: 100% 100%   Vitals:   05/30/24 0811 05/30/24 1633 05/31/24 0458 05/31/24 0500  BP: (!) 144/68 139/64 132/69   Pulse: 65 67 66   Resp: 19 19 17    Temp: 97.8 F (36.6 C) 97.7 F (36.5 C) 98.2 F (36.8 C)   TempSrc:      SpO2: 100% 100% 100%   Weight:    73 kg  Height:        General: Pt is alert, awake, not in acute distress Cardiovascular: RRR, S1/S2 +, no rubs, no gallops Respiratory: CTA bilaterally, no wheezing, no rhonchi Abdominal: Soft, NT, ND, bowel sounds + Extremities: no edema, no  cyanosis    The results of significant diagnostics from this hospitalization (including imaging, microbiology, ancillary and laboratory) are listed below for reference.     Microbiology: No results found for this or any previous visit (from the past 240 hours).   Labs: BNP (last 3 results) Recent Labs    08/09/23 0615  BNP 76.7   Basic Metabolic Panel: Recent Labs  Lab 05/27/24 0441 05/28/24 0425 05/29/24 0424  05/30/24 0309 05/31/24 0358  NA 130* 131* 129* 131* 133*  K 4.0 3.6 3.3* 3.7 3.6  CL 105 103 99 100 104  CO2 20* 19* 24 21* 22  GLUCOSE 91 96 98 88 112*  BUN 7* 8 11 13 14   CREATININE 1.07* 0.94 1.12* 1.22* 1.18*  CALCIUM  8.4* 8.9 8.8* 8.9 8.9  MG 1.8 1.5* 1.7 1.4* 2.0   Liver Function Tests: Recent Labs  Lab 05/24/24 2125 05/25/24 1539  AST 47* 46*  ALT 23 24  ALKPHOS 57 56  BILITOT 0.9 0.8  PROT 8.5* 8.2*  ALBUMIN 4.1 3.9   Recent Labs  Lab 05/24/24 2125  LIPASE 21   No results for input(s): AMMONIA in the last 168 hours. CBC: Recent Labs  Lab 05/27/24 0441 05/28/24 0425 05/29/24 0424 05/30/24 0309 05/31/24 0358  WBC 12.5* 14.7* 13.4* 12.0* 13.2*  NEUTROABS 8.1* 10.6* 9.1* 7.6 8.6*  HGB 11.3* 11.6* 11.2* 10.7* 11.1*  HCT 32.7* 34.7* 32.4* 31.4* 32.3*  MCV 90.1 91.8 89.5 90.8 90.0  PLT 210 203 232 211 231   Cardiac Enzymes: No results for input(s): CKTOTAL, CKMB, CKMBINDEX, TROPONINI in the last 168 hours. BNP: Invalid input(s): POCBNP CBG: No results for input(s): GLUCAP in the last 168 hours. D-Dimer No results for input(s): DDIMER in the last 72 hours. Hgb A1c No results for input(s): HGBA1C in the last 72 hours. Lipid Profile No results for input(s): CHOL, HDL, LDLCALC, TRIG, CHOLHDL, LDLDIRECT in the last 72 hours. Thyroid  function studies No results for input(s): TSH, T4TOTAL, T3FREE, THYROIDAB in the last 72 hours.  Invalid input(s): FREET3 Anemia work up No results for input(s):  VITAMINB12, FOLATE, FERRITIN, TIBC, IRON , RETICCTPCT in the last 72 hours. Urinalysis    Component Value Date/Time   COLORURINE YELLOW 05/25/2024 0031   APPEARANCEUR HAZY (A) 05/25/2024 0031   LABSPEC 1.008 05/25/2024 0031   PHURINE 8.0 05/25/2024 0031   GLUCOSEU NEGATIVE 05/25/2024 0031   HGBUR NEGATIVE 05/25/2024 0031   BILIRUBINUR NEGATIVE 05/25/2024 0031   KETONESUR NEGATIVE 05/25/2024 0031   PROTEINUR 100 (A) 05/25/2024 0031   NITRITE NEGATIVE 05/25/2024 0031   LEUKOCYTESUR NEGATIVE 05/25/2024 0031   Sepsis Labs Recent Labs  Lab 05/28/24 0425 05/29/24 0424 05/30/24 0309 05/31/24 0358  WBC 14.7* 13.4* 12.0* 13.2*   Microbiology No results found for this or any previous visit (from the past 240 hours).  FURTHER DISCHARGE INSTRUCTIONS:   Get Medicines reviewed and adjusted: Please take all your medications with you for your next visit with your Primary MD   Laboratory/radiological data: Please request your Primary MD to go over all hospital tests and procedure/radiological results at the follow up, please ask your Primary MD to get all Hospital records sent to his/her office.   In some cases, they will be blood work, cultures and biopsy results pending at the time of your discharge. Please request that your primary care M.D. goes through all the records of your hospital data and follows up on these results.   Also Note the following: If you experience worsening of your admission symptoms, develop shortness of breath, life threatening emergency, suicidal or homicidal thoughts you must seek medical attention immediately by calling 911 or calling your MD immediately  if symptoms less severe.   You must read complete instructions/literature along with all the possible adverse reactions/side effects for all the Medicines you take and that have been prescribed to you. Take any new Medicines after you have completely understood and accpet all the possible  adverse  reactions/side effects.    patient was instructed, not to drive, operate heavy machinery, perform activities at heights, swimming or participation in water  activities or provide baby-sitting services while on Pain, Sleep and Anxiety Medications; until their outpatient Physician has advised to do so again. Also recommended to not to take more than prescribed Pain, Sleep and Anxiety Medications.  It is not advisable to combine anxiety, sleep and pain medications without talking with your primary care provider.     Wear Seat belts while driving.   Please note: You were cared for by a hospitalist during your hospital stay. Once you are discharged, your primary care physician will handle any further medical issues. Please note that NO REFILLS for any discharge medications will be authorized once you are discharged, as it is imperative that you return to your primary care physician (or establish a relationship with a primary care physician if you do not have one) for your post hospital discharge needs so that they can reassess your need for medications and monitor your lab values  Time coordinating discharge: Over 30 minutes  SIGNED:   Fredia Skeeter, MD  [facility] 05/31/2024, 12:07 PM *Please note that this is a verbal dictation therefore any spelling or grammatical errors are due to the Dragon Medical One system interpretation. If 7PM-7AM, please contact night-coverage [email or web address]

## 2024-06-12 ENCOUNTER — Inpatient Hospital Stay
Admission: EM | Admit: 2024-06-12 | Discharge: 2024-06-16 | DRG: 394 | Disposition: A | Discharge status: Home Health | Attending: Internal Medicine | Admitting: Internal Medicine

## 2024-06-12 ENCOUNTER — Emergency Department

## 2024-06-12 DIAGNOSIS — E039 Hypothyroidism, unspecified: Secondary | ICD-10-CM | POA: Diagnosis present

## 2024-06-12 DIAGNOSIS — R112 Nausea with vomiting, unspecified: Secondary | ICD-10-CM | POA: Diagnosis present

## 2024-06-12 DIAGNOSIS — Z9012 Acquired absence of left breast and nipple: Secondary | ICD-10-CM

## 2024-06-12 DIAGNOSIS — Z8249 Family history of ischemic heart disease and other diseases of the circulatory system: Secondary | ICD-10-CM

## 2024-06-12 DIAGNOSIS — I10 Essential (primary) hypertension: Secondary | ICD-10-CM | POA: Diagnosis present

## 2024-06-12 DIAGNOSIS — F32A Depression, unspecified: Secondary | ICD-10-CM | POA: Diagnosis present

## 2024-06-12 DIAGNOSIS — K5730 Diverticulosis of large intestine without perforation or abscess without bleeding: Secondary | ICD-10-CM | POA: Diagnosis present

## 2024-06-12 DIAGNOSIS — Z881 Allergy status to other antibiotic agents status: Secondary | ICD-10-CM

## 2024-06-12 DIAGNOSIS — Z853 Personal history of malignant neoplasm of breast: Secondary | ICD-10-CM

## 2024-06-12 DIAGNOSIS — F0390 Unspecified dementia without behavioral disturbance: Secondary | ICD-10-CM | POA: Diagnosis present

## 2024-06-12 DIAGNOSIS — E876 Hypokalemia: Secondary | ICD-10-CM | POA: Diagnosis present

## 2024-06-12 DIAGNOSIS — I2510 Atherosclerotic heart disease of native coronary artery without angina pectoris: Secondary | ICD-10-CM | POA: Diagnosis present

## 2024-06-12 DIAGNOSIS — Z91040 Latex allergy status: Secondary | ICD-10-CM

## 2024-06-12 DIAGNOSIS — Z66 Do not resuscitate: Secondary | ICD-10-CM | POA: Diagnosis present

## 2024-06-12 DIAGNOSIS — Z809 Family history of malignant neoplasm, unspecified: Secondary | ICD-10-CM

## 2024-06-12 DIAGNOSIS — R1115 Cyclical vomiting syndrome unrelated to migraine: Principal | ICD-10-CM | POA: Diagnosis present

## 2024-06-12 DIAGNOSIS — Z7982 Long term (current) use of aspirin: Secondary | ICD-10-CM

## 2024-06-12 DIAGNOSIS — E872 Acidosis, unspecified: Secondary | ICD-10-CM | POA: Diagnosis present

## 2024-06-12 DIAGNOSIS — Z79899 Other long term (current) drug therapy: Secondary | ICD-10-CM

## 2024-06-12 DIAGNOSIS — Z79890 Hormone replacement therapy (postmenopausal): Secondary | ICD-10-CM

## 2024-06-12 DIAGNOSIS — Z888 Allergy status to other drugs, medicaments and biological substances status: Secondary | ICD-10-CM

## 2024-06-12 DIAGNOSIS — E785 Hyperlipidemia, unspecified: Secondary | ICD-10-CM | POA: Diagnosis present

## 2024-06-12 DIAGNOSIS — R519 Headache, unspecified: Principal | ICD-10-CM | POA: Diagnosis present

## 2024-06-12 DIAGNOSIS — Z885 Allergy status to narcotic agent status: Secondary | ICD-10-CM

## 2024-06-12 DIAGNOSIS — D72829 Elevated white blood cell count, unspecified: Secondary | ICD-10-CM | POA: Diagnosis present

## 2024-06-12 DIAGNOSIS — K219 Gastro-esophageal reflux disease without esophagitis: Secondary | ICD-10-CM | POA: Diagnosis present

## 2024-06-12 DIAGNOSIS — E871 Hypo-osmolality and hyponatremia: Secondary | ICD-10-CM | POA: Diagnosis present

## 2024-06-12 DIAGNOSIS — I110 Hypertensive heart disease with heart failure: Secondary | ICD-10-CM | POA: Diagnosis present

## 2024-06-12 DIAGNOSIS — I5032 Chronic diastolic (congestive) heart failure: Secondary | ICD-10-CM | POA: Diagnosis present

## 2024-06-12 LAB — URINALYSIS, W/ REFLEX TO CULTURE (INFECTION SUSPECTED)
Bacteria, UA: NONE SEEN
Bilirubin Urine: NEGATIVE
Glucose, UA: NEGATIVE mg/dL
Hgb urine dipstick: NEGATIVE
Ketones, ur: NEGATIVE mg/dL
Leukocytes,Ua: NEGATIVE
Nitrite: NEGATIVE
Protein, ur: 30 mg/dL — AB
Specific Gravity, Urine: 1.009 (ref 1.005–1.030)
pH: 8 (ref 5.0–8.0)

## 2024-06-12 LAB — COMPREHENSIVE METABOLIC PANEL WITH GFR
ALT: 18 U/L (ref 0–44)
AST: 56 U/L — ABNORMAL HIGH (ref 15–41)
Albumin: 4.2 g/dL (ref 3.5–5.0)
Alkaline Phosphatase: 60 U/L (ref 38–126)
Anion gap: 20 — ABNORMAL HIGH (ref 5–15)
BUN: 10 mg/dL (ref 8–23)
CO2: 18 mmol/L — ABNORMAL LOW (ref 22–32)
Calcium: 9.9 mg/dL (ref 8.9–10.3)
Chloride: 94 mmol/L — ABNORMAL LOW (ref 98–111)
Creatinine, Ser: 1.26 mg/dL — ABNORMAL HIGH (ref 0.44–1.00)
GFR, Estimated: 43 mL/min — ABNORMAL LOW (ref >60)
Glucose, Bld: 153 mg/dL — ABNORMAL HIGH (ref 70–99)
Potassium: 3.5 mmol/L (ref 3.5–5.1)
Sodium: 132 mmol/L — ABNORMAL LOW (ref 135–145)
Total Bilirubin: 1.4 mg/dL — ABNORMAL HIGH (ref 0.0–1.2)
Total Protein: 8.7 g/dL — ABNORMAL HIGH (ref 6.5–8.1)

## 2024-06-12 LAB — PROCALCITONIN: Procalcitonin: 0.12 ng/mL

## 2024-06-12 LAB — CBC WITH DIFFERENTIAL/PLATELET
Abs Immature Granulocytes: 0.15 K/uL — ABNORMAL HIGH (ref 0.00–0.07)
Basophils Absolute: 0 K/uL (ref 0.0–0.1)
Basophils Relative: 0 %
Eosinophils Absolute: 0 K/uL (ref 0.0–0.5)
Eosinophils Relative: 0 %
HCT: 37 % (ref 36.0–46.0)
Hemoglobin: 12.8 g/dL (ref 12.0–15.0)
Immature Granulocytes: 1 %
Lymphocytes Relative: 5 %
Lymphs Abs: 0.9 K/uL (ref 0.7–4.0)
MCH: 30.9 pg (ref 26.0–34.0)
MCHC: 34.6 g/dL (ref 30.0–36.0)
MCV: 89.4 fL (ref 80.0–100.0)
Monocytes Absolute: 1 K/uL (ref 0.1–1.0)
Monocytes Relative: 5 %
Neutro Abs: 16.9 K/uL — ABNORMAL HIGH (ref 1.7–7.7)
Neutrophils Relative %: 89 %
Platelets: 313 K/uL (ref 150–400)
RBC: 4.14 MIL/uL (ref 3.87–5.11)
RDW: 13.3 % (ref 11.5–15.5)
WBC: 19 K/uL — ABNORMAL HIGH (ref 4.0–10.5)
nRBC: 0 % (ref 0.0–0.2)

## 2024-06-12 LAB — LIPASE, BLOOD: Lipase: 22 U/L (ref 11–51)

## 2024-06-12 LAB — LACTIC ACID, PLASMA
Lactic Acid, Venous: 1.5 mmol/L (ref 0.5–1.9)
Lactic Acid, Venous: 4.5 mmol/L (ref 0.5–1.9)

## 2024-06-12 MED ORDER — PROCHLORPERAZINE EDISYLATE 10 MG/2ML IJ SOLN
10.0000 mg | INTRAMUSCULAR | Status: DC | PRN
  Administered 2024-06-13: 10 mg via INTRAVENOUS
  Filled 2024-06-12: qty 2

## 2024-06-12 MED ORDER — MORPHINE SULFATE (PF) 2 MG/ML IV SOLN: 2.0000 mg | INTRAVENOUS | Status: DC | PRN

## 2024-06-12 MED ORDER — IOHEXOL 300 MG/ML  SOLN
80.0000 mL | Freq: Once | INTRAMUSCULAR | Status: AC | PRN
  Administered 2024-06-12: 80 mL via INTRAVENOUS

## 2024-06-12 MED ORDER — DIPHENHYDRAMINE HCL 50 MG/ML IJ SOLN
25.0000 mg | Freq: Once | INTRAMUSCULAR | Status: AC
  Administered 2024-06-12: 25 mg via INTRAVENOUS
  Filled 2024-06-12: qty 1

## 2024-06-12 MED ORDER — LACTATED RINGERS IV BOLUS (SEPSIS)
1000.0000 mL | Freq: Once | INTRAVENOUS | Status: AC
  Administered 2024-06-12: 1000 mL via INTRAVENOUS

## 2024-06-12 MED ORDER — VANCOMYCIN HCL 1500 MG/300ML IV SOLN
1500.0000 mg | Freq: Once | INTRAVENOUS | Status: AC
  Administered 2024-06-13: 1500 mg via INTRAVENOUS
  Filled 2024-06-12: qty 300

## 2024-06-12 MED ORDER — MAGNESIUM OXIDE 400 MG PO TABS
400.0000 mg | ORAL_TABLET | Freq: Every day | ORAL | Status: DC
  Administered 2024-06-13: 400 mg via ORAL
  Filled 2024-06-12 (×2): qty 1

## 2024-06-12 MED ORDER — METOCLOPRAMIDE HCL 5 MG/ML IJ SOLN
10.0000 mg | Freq: Four times a day (QID) | INTRAMUSCULAR | Status: AC
  Administered 2024-06-12 – 2024-06-13 (×3): 10 mg via INTRAVENOUS
  Filled 2024-06-12 (×3): qty 2

## 2024-06-12 MED ORDER — SODIUM CHLORIDE 0.9% FLUSH: 10.0000 mL | INTRAVENOUS | Status: DC | PRN

## 2024-06-12 MED ORDER — LACTATED RINGERS IV SOLN: INTRAVENOUS | Status: DC

## 2024-06-12 MED ORDER — VANCOMYCIN HCL IN DEXTROSE 1-5 GM/200ML-% IV SOLN
1000.0000 mg | Freq: Once | INTRAVENOUS | Status: DC
  Filled 2024-06-12: qty 200

## 2024-06-12 MED ORDER — LORAZEPAM 0.5 MG PO TABS
0.5000 mg | ORAL_TABLET | Freq: Every day | ORAL | Status: DC | PRN
  Administered 2024-06-13 – 2024-06-14 (×2): 0.5 mg via ORAL
  Filled 2024-06-12 (×2): qty 1

## 2024-06-12 MED ORDER — LACTATED RINGERS IV BOLUS (SEPSIS)
250.0000 mL | Freq: Once | INTRAVENOUS | Status: AC
  Administered 2024-06-13: 250 mL via INTRAVENOUS

## 2024-06-12 MED ORDER — HYDRALAZINE HCL 50 MG PO TABS
75.0000 mg | ORAL_TABLET | Freq: Three times a day (TID) | ORAL | Status: DC
  Administered 2024-06-12 – 2024-06-16 (×11): 75 mg via ORAL
  Filled 2024-06-12: qty 1
  Filled 2024-06-12: qty 2
  Filled 2024-06-12 (×9): qty 1

## 2024-06-12 MED ORDER — ACETAMINOPHEN 650 MG RE SUPP: 650.0000 mg | Freq: Four times a day (QID) | RECTAL | Status: DC | PRN

## 2024-06-12 MED ORDER — SODIUM CHLORIDE 0.9 % IV SOLN
2.0000 g | Freq: Once | INTRAVENOUS | Status: AC
  Administered 2024-06-12: 2 g via INTRAVENOUS
  Filled 2024-06-12: qty 12.5

## 2024-06-12 MED ORDER — PANTOPRAZOLE SODIUM 40 MG IV SOLR
40.0000 mg | INTRAVENOUS | Status: DC
  Administered 2024-06-12 – 2024-06-15 (×4): 40 mg via INTRAVENOUS
  Filled 2024-06-12 (×5): qty 10

## 2024-06-12 MED ORDER — METRONIDAZOLE 500 MG/100ML IV SOLN
500.0000 mg | Freq: Once | INTRAVENOUS | Status: AC
  Administered 2024-06-12: 500 mg via INTRAVENOUS
  Filled 2024-06-12: qty 100

## 2024-06-12 MED ORDER — LEVOTHYROXINE SODIUM 50 MCG PO TABS
50.0000 ug | ORAL_TABLET | Freq: Every day | ORAL | Status: DC
  Administered 2024-06-13 – 2024-06-16 (×4): 50 ug via ORAL
  Filled 2024-06-12 (×4): qty 1

## 2024-06-12 MED ORDER — ENOXAPARIN SODIUM 40 MG/0.4ML IJ SOSY
40.0000 mg | PREFILLED_SYRINGE | INTRAMUSCULAR | Status: DC
  Administered 2024-06-12 – 2024-06-15 (×4): 40 mg via SUBCUTANEOUS
  Filled 2024-06-12 (×4): qty 0.4

## 2024-06-12 MED ORDER — SODIUM CHLORIDE 0.9% FLUSH
10.0000 mL | Freq: Two times a day (BID) | INTRAVENOUS | Status: DC
  Administered 2024-06-13 – 2024-06-15 (×5): 10 mL
  Administered 2024-06-16: 20 mL

## 2024-06-12 MED ORDER — PROCHLORPERAZINE EDISYLATE 10 MG/2ML IJ SOLN
10.0000 mg | Freq: Once | INTRAMUSCULAR | Status: AC
  Administered 2024-06-12: 10 mg via INTRAVENOUS
  Filled 2024-06-12: qty 2

## 2024-06-12 MED ORDER — HYDRALAZINE HCL 20 MG/ML IJ SOLN: 5.0000 mg | INTRAMUSCULAR | Status: DC | PRN

## 2024-06-12 MED ORDER — ASPIRIN 81 MG PO TBEC
81.0000 mg | DELAYED_RELEASE_TABLET | Freq: Every day | ORAL | Status: DC
  Administered 2024-06-12 – 2024-06-15 (×4): 81 mg via ORAL
  Filled 2024-06-12 (×4): qty 1

## 2024-06-12 MED ORDER — ACETAMINOPHEN 325 MG PO TABS
650.0000 mg | ORAL_TABLET | Freq: Four times a day (QID) | ORAL | Status: DC | PRN
Start: 2024-06-12 — End: 2024-06-16
  Administered 2024-06-13 – 2024-06-14 (×2): 650 mg via ORAL
  Filled 2024-06-12 (×2): qty 2

## 2024-06-12 NOTE — Sepsis Progress Note (Signed)
 Repeat lactic not collected yet.  Sent secure chat to bedside RN RONAL Collier requesting collection.

## 2024-06-12 NOTE — Consult Note (Signed)
 CODE SEPSIS - PHARMACY COMMUNICATION  **Broad-spectrum antimicrobials should be administered within one hour of sepsis diagnosis**  Time Code Sepsis call or page was received: 1730  Antibiotics ordered: Cefepime , Vancomycin , Metronidazole   Time of first antibiotic administration: 1815  Additional action taken by pharmacy: Messaged RN    Will M. Lenon, PharmD Clinical Pharmacist 06/12/2024 6:24 PM

## 2024-06-12 NOTE — ED Triage Notes (Incomplete)
 Pt presents to the ED via [facility] from home. Pt reports N/V that started last night. Pt is A&Ox2. Unknown  99.8 temporal ETCo2 9-12 BP 146/82 HR 105 97% RA CBG 196

## 2024-06-12 NOTE — Assessment & Plan Note (Signed)
 Hydralazine  as needed while unable to tolerate

## 2024-06-12 NOTE — Assessment & Plan Note (Addendum)
 Leukocytosis Anion gap metabolic acidosis Likely secondary to intractable vomiting Low suspicion for sepsis as patient is not tachycardic or febrile. Urinalysis sterile, chest x-ray is clear.  CT abdomen unrevealing Will get procalcitonin   >0.12 so low suspicion for bacterial infection Will continue fluids Will hold off on antibiotics until procalcitonin results (0.12)-got cefepime  Flagyl  and vancomycin  in the ED Will continue to trend lactic and WBC with hydration Follow cultures

## 2024-06-12 NOTE — Assessment & Plan Note (Signed)
 Clinically dry Continue home telmisartan  and metoprolol  and hydralazine 

## 2024-06-12 NOTE — Sepsis Progress Note (Addendum)
 Notified bedside nurse of need to draw repeat lactic acid.  Per RN K. Chandra, they are working on it.

## 2024-06-12 NOTE — Assessment & Plan Note (Signed)
 Delirium precautions Fluoxetine  and lorazepam  if able to tolerate

## 2024-06-12 NOTE — Assessment & Plan Note (Signed)
 Expecting improvement with IV hydration Continue to monitor

## 2024-06-12 NOTE — Assessment & Plan Note (Signed)
 Symptomatic treatment.

## 2024-06-12 NOTE — Assessment & Plan Note (Signed)
 Levothyroxine  if able to tolerate

## 2024-06-12 NOTE — ED Notes (Signed)
 Pt in CT.

## 2024-06-12 NOTE — ED Provider Notes (Signed)
 [facility] Provider Note    Event Date/Time   First MD Initiated Contact with Patient 06/12/24 1657     (approximate)   History   Chief Complaint Emesis   HPI  Cindy Huerta is a 78 y.o. female with past medical history of hypertension, hyperlipidemia, CAD, CHF, hypothyroidism, and GERD who presents to the ED complaining of nausea and vomiting.  Patient reports that she has been feeling ill this evening with headache, abdominal pain, nausea, and vomiting.  Patient having difficulty providing additional history due to the headache and nausea, repeatedly stating please help me.  Speaking with her son over the phone, patient has been dealing with intractable nausea and vomiting along with a headache since yesterday.  He states that she has been unable to keep anything down since that time, has had multiple similar episodes in the past, including during an admission at the end of last month.     Physical Exam   Triage Vital Signs: ED Triage Vitals [06/12/24 1628]  Encounter Vitals Group     BP (!) 169/75     Girls Systolic BP Percentile      Girls Diastolic BP Percentile      Boys Systolic BP Percentile      Boys Diastolic BP Percentile      Pulse Rate 87     Resp (!) 25     Temp 98.4 F (36.9 C)     Temp Source Oral     SpO2 100 %     Weight      Height      Head Circumference      Peak Flow      Pain Score      Pain Loc      Pain Education      Exclude from Growth Chart     Most recent vital signs: Vitals:   06/12/24 1800 06/12/24 1945  BP: (!) 159/102 (!) 153/67  Pulse: 92 88  Resp: (!) 28 (!) 21  Temp:    SpO2: 100% 99%    Constitutional: Alert and oriented to person and place, but not time or situation. Eyes: Conjunctivae are normal.  Pupils equal, round, and reactive to light bilaterally. Head: Atraumatic. Neck: Supple with no meningismus. Nose: No congestion/rhinnorhea. Mouth/Throat: Mucous membranes are moist.   Cardiovascular: Tachycardic, regular rhythm. Grossly normal heart sounds.  2+ radial pulses bilaterally. Respiratory: Tachypneic with normal respiratory effort.  No retractions. Lungs CTAB. Gastrointestinal: Soft and diffusely tender to palpation with no rebound or guarding. No distention. Musculoskeletal: No lower extremity tenderness nor edema.  Neurologic:  Normal speech and language. No gross focal neurologic deficits are appreciated.    ED Results / Procedures / Treatments   Labs (all labs ordered are listed, but only abnormal results are displayed) Labs Reviewed  COMPREHENSIVE METABOLIC PANEL WITH GFR - Abnormal; Notable for the following components:      Result Value   Sodium 132 (*)    Chloride 94 (*)    CO2 18 (*)    Glucose, Bld 153 (*)    Creatinine, Ser 1.26 (*)    Total Protein 8.7 (*)    AST 56 (*)    Total Bilirubin 1.4 (*)    GFR, Estimated 43 (*)    Anion gap 20 (*)    All other components within normal limits  LACTIC ACID, PLASMA - Abnormal; Notable for the following components:   Lactic Acid, Venous 4.5 (*)  All other components within normal limits  CBC WITH DIFFERENTIAL/PLATELET - Abnormal; Notable for the following components:   WBC 19.0 (*)    Neutro Abs 16.9 (*)    Abs Immature Granulocytes 0.15 (*)    All other components within normal limits  URINALYSIS, W/ REFLEX TO CULTURE (INFECTION SUSPECTED) - Abnormal; Notable for the following components:   Color, Urine STRAW (*)    APPearance CLEAR (*)    Protein, ur 30 (*)    All other components within normal limits  CULTURE, BLOOD (ROUTINE X 2)  CULTURE, BLOOD (ROUTINE X 2)  LIPASE, BLOOD  LACTIC ACID, PLASMA  PROTIME-INR    RADIOLOGY Chest x-ray reviewed and interpreted by me with no infiltrate, edema, or effusion.  PROCEDURES:  Critical Care performed: Yes, see critical care procedure note(s)  .Critical Care  Performed by: Willo Dunnings, MD Authorized by: Willo Dunnings, MD    Critical care provider statement:    Critical care time (minutes):  30   Critical care time was exclusive of:  Separately billable procedures and treating other patients and teaching time   Critical care was necessary to treat or prevent imminent or life-threatening deterioration of the following conditions:  Sepsis   Critical care was time spent personally by me on the following activities:  Development of treatment plan with patient or surrogate, discussions with consultants, evaluation of patient's response to treatment, examination of patient, ordering and review of laboratory studies, ordering and review of radiographic studies, ordering and performing treatments and interventions, pulse oximetry, re-evaluation of patient's condition and review of old charts   I assumed direction of critical care for this patient from another provider in my specialty: no     Care discussed with: admitting provider      MEDICATIONS ORDERED IN ED: Medications  lactated ringers  bolus 1,000 mL (1,000 mLs Intravenous New Bag/Given 06/12/24 1804)    And  lactated ringers  bolus 1,000 mL (has no administration in time range)    And  lactated ringers  bolus 250 mL (has no administration in time range)  metroNIDAZOLE  (FLAGYL ) IVPB 500 mg (has no administration in time range)  vancomycin  (VANCOREADY) IVPB 1500 mg/300 mL (has no administration in time range)  ceFEPIme  (MAXIPIME ) 2 g in sodium chloride  0.9 % 100 mL IVPB (0 g Intravenous Stopped 06/12/24 1929)  prochlorperazine  (COMPAZINE ) injection 10 mg (10 mg Intravenous Given 06/12/24 1745)  diphenhydrAMINE  (BENADRYL ) injection 25 mg (25 mg Intravenous Given 06/12/24 1744)  iohexol  (OMNIPAQUE ) 300 MG/ML solution 80 mL (80 mLs Intravenous Contrast Given 06/12/24 1902)     IMPRESSION / MDM / ASSESSMENT AND PLAN / ED COURSE  I reviewed the triage vital signs and the nursing notes.                              78 y.o. female with past medical history of hypertension,  hyperlipidemia, CAD, CHF, hypothyroidism, and GERD who presents to the ED complaining of nausea, vomiting, headache, and abdominal pain since yesterday.  Patient's presentation is most consistent with acute presentation with potential threat to life or bodily function.  Differential diagnosis includes, but is not limited to, bowel obstruction, sepsis, UTI, appendicitis, cholecystitis, pancreatitis, hepatitis, pneumonia, ACS, SAH, meningitis.  Patient ill-appearing and complaining of severe headache and abdominal pain with diffuse tenderness across her abdomen on exam.  Vital signs markable for tachycardia and tachypnea, initial labs show leukocytosis and presentation concerning for sepsis.  We will give  30 cc/kg of IV fluids and started on broad-spectrum antibiotics.  She has had similar presentations in the past with unremarkable imaging, but presentation seems somewhat worse today along with elevated lactic acid.  We will check CT head and CT of her abdomen/pelvis, chest x-ray unremarkable.  We will treat symptomatically with IV Compazine  and Benadryl , reassess following additional labs and imaging.  CT head as well as CT of abdomen/pelvis is negative for acute process.  Urinalysis also with no signs of infection and there are no findings concerning for meningitis, low suspicion for sepsis at this time.  Suspect lactic acidosis due to dehydration with her intractable nausea and vomiting, patient also with anion gap acidosis, suspect starvation ketosis.  Patient somewhat improved on reassessment, but continues to have difficulty tolerating oral intake.  Case discussed with hospitalist for admission.      FINAL CLINICAL IMPRESSION(S) / ED DIAGNOSES   Final diagnoses:  Acute nonintractable headache, unspecified headache type  Intractable nausea and vomiting     Rx / DC Orders   ED Discharge Orders     None        Note:  This document was prepared using Dragon voice recognition software  and may include unintentional dictation errors.   Willo Dunnings, MD 06/12/24 2029

## 2024-06-12 NOTE — Progress Notes (Signed)

## 2024-06-12 NOTE — H&P (Signed)
 History and Physical    Patient: Cindy Huerta FMW:[id number] DOB: [birth year] DOA: 06/12/2024 DOS: the patient was seen and examined on 06/12/2024 PCP: Glover [name], MD  Patient coming from: Home  Chief Complaint:  Chief Complaint  Patient presents with  . Emesis    HPI: Cindy Huerta is a 78 y.o. female with medical history significant for dementia, HTN, HLD, CHF, depression, GERD, CAD, hypothyroidism with recurrent admissions for unexplained nausea and vomiting most recently from 6/19 to 05/30/2024, being admitted with 24 hours onset of recurrent symptoms, with similar presentation to prior.  Symptoms include abdominal pain, nausea and vomiting and inability to take any p.o. intake she also had a headache.  She had no fever or chills or change in bowel habits. Of note, last EGD on record from 2022 was normal.  Indication was for the same, intractable nausea and vomiting Upon arrival, afebrile, tachypneic, not tachycardic, SBP in the 150s to 160s and satting close to 100 on room air. Labs with WBC 19,000 and lactic acid 4.5.  Creatinine 1.26 which is about her baseline with bicarb of 18 and anion gap of 20.  Urinalysis without ketones or signs of infection.  Lipase and LFTs unremarkable. CT abdomen and pelvis showing diverticulosis without diverticulitis and otherwise nonacute CT head nonacute Chest x-ray nonacute Patient treated with several antiemetics, IV fluid bolus started on broad-spectrum antibiotics including cefepime , vancomycin  and Flagyl  Admission requested     Past Medical History:  Diagnosis Date  . Arthritis   . Breast cancer (HCC)   . Depression   . GERD (gastroesophageal reflux disease)   . HLD (hyperlipidemia)   . Hypertension   . Hypothyroidism    Past Surgical History:  Procedure Laterality Date  . ABDOMINAL HYSTERECTOMY  1982  . AXILLARY SENTINEL NODE BIOPSY Left 11/20/2021   Procedure: AXILLARY SENTINEL NODE BIOPSY;  Surgeon: Tye Millet, DO;   Location: [facility] ORS;  Service: General;  Laterality: Left;  . BREAST BIOPSY Left 09/23/2021   3:00 13cmfn venus marker, path pending  . BREAST BIOPSY Left 09/23/2021   3:00 14 cmfn heart marker, path pending  . CHOLECYSTECTOMY    . ESOPHAGOGASTRODUODENOSCOPY (EGD) WITH PROPOFOL  N/A 10/08/2021   Procedure: ESOPHAGOGASTRODUODENOSCOPY (EGD) WITH PROPOFOL ;  Surgeon: Therisa Bi, MD;  Location: [facility];  Service: Gastroenterology;  Laterality: N/A;  . HEMATOMA EVACUATION Left 11/21/2021   Procedure: EVACUATION HEMATOMA;  Surgeon: Tye Millet, DO;  Location: [facility] ORS;  Service: General;  Laterality: Left;  . IR FLUORO GUIDE CV LINE LEFT  08/17/2022  . IR US  GUIDE VASC ACCESS LEFT  08/17/2022  . IR US  GUIDE VASC ACCESS RIGHT  08/17/2022  . left lumpectomy  2019  . TOTAL MASTECTOMY Left 11/20/2021   Procedure: TOTAL MASTECTOMY;  Surgeon: Tye Millet, DO;  Location: [facility] ORS;  Service: General;  Laterality: Left;   Social History:  reports that she has never smoked. She has never used smokeless tobacco. She reports that she does not drink alcohol  and does not use drugs.  Allergies  Allergen Reactions  . Latex Rash  . Novocain [Procaine] Other (See Comments)    Unsure - told by DDS not to let anyone give it to her  Confusion   . Zestril [Lisinopril] Cough  . Benadryl  [Diphenhydramine ] Other (See Comments)    Jitteriness Agitation  . Biaxin [Clarithromycin] Other (See Comments)    Confusion    . Roxicodone [Oxycodone] Nausea And Vomiting and Anxiety    Family History  Problem Relation Age  of Onset  . Heart disease Mother   . Cancer Paternal Grandmother     Prior to Admission medications   Medication Sig Start Date End Date Taking? Authorizing Provider  acetaminophen  (TYLENOL ) 500 MG tablet Take 500-1,000 mg by mouth daily as needed for moderate pain (pain score 4-6) or mild pain (pain score 1-3).    [provider]  amLODipine  (NORVASC ) 10 MG tablet Take 1 tablet (10  mg total) by mouth daily. 08/25/22   Amin, Ankit C, MD  ARTIFICIAL TEARS 0.1-0.3 % SOLN Place 1 drop into both eyes daily as needed for dry eyes. 07/15/22   [provider]  aspirin  81 MG EC tablet Take 1 tablet (81 mg total) by mouth daily. RESTART 48HRS AFTER DISCHARGE Patient taking differently: Take 81 mg by mouth at bedtime. 11/25/21   Sakai, Isami, DO  atorvastatin  (LIPITOR ) 80 MG tablet Take 80 mg by mouth daily. 09/21/21   [provider]  Cholecalciferol  (VITAMIN D -3 PO) Take 1 capsule by mouth at bedtime.    [provider]  cloNIDine  (CATAPRES ) 0.1 MG tablet Take 0.1 mg by mouth daily.    [provider]  ferrous sulfate  325 (65 FE) MG EC tablet Take 325 mg by mouth at bedtime.    [provider]  FLUoxetine  (PROZAC ) 10 MG capsule Take 1 capsule (10 mg total) by mouth daily. Patient not taking: Reported on 05/25/2024 09/16/23 04/30/24  Lenon Marien CROME, MD  fluticasone  (FLONASE ) 50 MCG/ACT nasal spray Place 1 spray into both nostrils daily as needed for allergies or rhinitis.    [provider]  hydrALAZINE  (APRESOLINE ) 25 MG tablet Take 3 tablets (75 mg total) by mouth 3 (three) times daily. 05/28/24 06/27/24  [name], MD  levothyroxine  (SYNTHROID ) 50 MCG tablet Take 50 mcg by mouth daily before breakfast. 02/29/24 02/28/25  [provider]  LORazepam  (ATIVAN ) 0.5 MG tablet Take 0.5 mg by mouth daily as needed (shaking).    [provider]  magnesium  oxide (MAG-OX) 400 MG tablet Take 1 tablet (400 mg total) by mouth daily. Patient taking differently: Take 400 mg by mouth at bedtime. 07/30/22   Tobie Yetta HERO, MD  meclizine  (ANTIVERT ) 12.5 MG tablet Take 1 tablet (12.5 mg total) by mouth 2 (two) times daily as needed for dizziness. 05/28/24   [name], MD  metoprolol  succinate (TOPROL -XL) 50 MG 24 hr tablet Take 50 mg by mouth 2 (two) times daily. 03/18/21   [provider]  pantoprazole  (PROTONIX ) 40  MG tablet Take 1 tablet (40 mg total) by mouth daily. 10/10/21 05/25/24  [name], MD  telmisartan  (MICARDIS ) 80 MG tablet Take 80 mg by mouth daily. 05/06/24   [provider]  trolamine salicylate (ASPERCREME) 10 % cream Apply 1 Application topically as needed for muscle pain.    [provider]    Physical Exam: Vitals:   06/12/24 1715 06/12/24 1730 06/12/24 1800 06/12/24 1945  BP: (!) 218/194 (!) 162/118 (!) 159/102 (!) 153/67  Pulse: 87 92 92 88  Resp:  (!) 31 (!) 28 (!) 21  Temp:      TempSrc:      SpO2: 100% 100% 100% 99%   Physical Exam  Labs on Admission: I have personally reviewed following labs and imaging studies  CBC: Recent Labs  Lab 06/12/24 1651  WBC 19.0*  NEUTROABS 16.9*  HGB 12.8  HCT 37.0  MCV 89.4  PLT 313   Basic Metabolic Panel: Recent Labs  Lab  06/12/24 1651  NA 132*  K 3.5  CL 94*  CO2 18*  GLUCOSE 153*  BUN 10  CREATININE 1.26*  CALCIUM  9.9   GFR: Estimated Creatinine Clearance: 36.4 mL/min (A) (by C-G formula based on SCr of 1.26 mg/dL (H)). Liver Function Tests: Recent Labs  Lab 06/12/24 1651  AST 56*  ALT 18  ALKPHOS 60  BILITOT 1.4*  PROT 8.7*  ALBUMIN 4.2   Recent Labs  Lab 06/12/24 1651  LIPASE 22   No results for input(s): AMMONIA in the last 168 hours. Coagulation Profile: No results for input(s): INR, PROTIME in the last 168 hours. Cardiac Enzymes: No results for input(s): CKTOTAL, CKMB, CKMBINDEX, TROPONINI in the last 168 hours. BNP (last 3 results) No results for input(s): PROBNP in the last 8760 hours. HbA1C: No results for input(s): HGBA1C in the last 72 hours. CBG: No results for input(s): GLUCAP in the last 168 hours. Lipid Profile: No results for input(s): CHOL, HDL, LDLCALC, TRIG, CHOLHDL, LDLDIRECT in the last 72 hours. Thyroid  Function Tests: No results for input(s): TSH, T4TOTAL, FREET4, T3FREE, THYROIDAB in the last 72  hours. Anemia Panel: No results for input(s): VITAMINB12, FOLATE, FERRITIN, TIBC, IRON , RETICCTPCT in the last 72 hours. Urine analysis:    Component Value Date/Time   COLORURINE STRAW (A) 06/12/2024 1942   APPEARANCEUR CLEAR (A) 06/12/2024 1942   LABSPEC 1.009 06/12/2024 1942   PHURINE 8.0 06/12/2024 1942   GLUCOSEU NEGATIVE 06/12/2024 1942   HGBUR NEGATIVE 06/12/2024 1942   BILIRUBINUR NEGATIVE 06/12/2024 1942   KETONESUR NEGATIVE 06/12/2024 1942   PROTEINUR 30 (A) 06/12/2024 1942   NITRITE NEGATIVE 06/12/2024 1942   LEUKOCYTESUR NEGATIVE 06/12/2024 1942    Radiological Exams on Admission: CT ABDOMEN PELVIS W CONTRAST Result Date: 06/12/2024 CLINICAL DATA:  Abdominal pain.  Sepsis. EXAM: CT ABDOMEN AND PELVIS WITH CONTRAST TECHNIQUE: Multidetector CT imaging of the abdomen and pelvis was performed using the standard protocol following bolus administration of intravenous contrast. RADIATION DOSE REDUCTION: This exam was performed according to the departmental dose-optimization program which includes automated exposure control, adjustment of the mA and/or kV according to patient size and/or use of iterative reconstruction technique. CONTRAST:  80mL OMNIPAQUE  IOHEXOL  300 MG/ML  SOLN COMPARISON:  05/24/2024 FINDINGS: Lower chest: Prior left mastectomy. No acute findings. Coronary artery and aortic atherosclerosis. Hepatobiliary: No focal liver abnormality is seen. Status post cholecystectomy. No biliary dilatation. Pancreas: No focal abnormality or ductal dilatation. Spleen: No focal abnormality.  Normal size. Adrenals/Urinary Tract: No adrenal abnormality. No focal renal abnormality. No stones or hydronephrosis. Urinary bladder is unremarkable. Stomach/Bowel: Left colonic diverticulosis. No active diverticulitis. Stomach and small bowel decompressed. No bowel obstruction or inflammatory process. Vascular/Lymphatic: Aortic atherosclerosis. No evidence of aneurysm or adenopathy.  Reproductive: Prior hysterectomy.  No adnexal masses. Other: No free fluid or free air. Musculoskeletal: No acute bony abnormality. IMPRESSION: Left colonic diverticulosis.  No active diverticulitis. Aortic atherosclerosis. No acute findings. Electronically Signed   By: Franky Crease M.D.   On: 06/12/2024 19:34   CT Head Wo Contrast Result Date: 06/12/2024 CLINICAL DATA:  Headache EXAM: CT HEAD WITHOUT CONTRAST TECHNIQUE: Contiguous axial images were obtained from the base of the skull through the vertex without intravenous contrast. RADIATION DOSE REDUCTION: This exam was performed according to the departmental dose-optimization program which includes automated exposure control, adjustment of the mA and/or kV according to patient size and/or use of iterative reconstruction technique. COMPARISON:  04/29/2024 FINDINGS: Brain: Old right caudate head lacunar infarct, stable. There is  atrophy and chronic small vessel disease changes. No acute intracranial abnormality. Specifically, no hemorrhage, hydrocephalus, mass lesion, acute infarction, or significant intracranial injury. Vascular: No hyperdense vessel or unexpected calcification. Skull: No acute calvarial abnormality. Sinuses/Orbits: No acute findings Other: None IMPRESSION: Atrophy, chronic microvascular disease. No acute intracranial abnormality. Electronically Signed   By: Franky Crease M.D.   On: 06/12/2024 19:32   DG Chest Port 1 View Result Date: 06/12/2024 CLINICAL DATA:  [id number] Sepsis [facility]) [id number] EXAM: PORTABLE CHEST - 1 VIEW COMPARISON:  May 24, 2024 FINDINGS: Lower lung volumes. No focal airspace consolidation, pleural effusion, or pneumothorax. No cardiomegaly. Tortuous aorta with aortic atherosclerosis. No acute fracture or destructive lesions. Multilevel thoracic osteophytosis. Bilateral AC joint osteoarthritis. IMPRESSION: No acute cardiopulmonary abnormality. Electronically Signed   By: Rogelia Myers M.D.   On: 06/12/2024 17:08   Data  Reviewed for HPI: Relevant notes from primary care and specialist visits, past discharge summaries as available in EHR, including Care Everywhere. Prior diagnostic testing as pertinent to current admission diagnoses Updated medications and problem lists for reconciliation ED course, including vitals, labs, imaging, treatment and response to treatment Triage notes, nursing and pharmacy notes and ED provider's notes Notable results as noted above in HPI      Assessment and Plan: Headache Symptomatic treatment  Lactic acidosis Leukocytosis Anion gap metabolic acidosis Likely secondary to intractable vomiting Low suspicion for sepsis as patient is not tachycardic or febrile. Urinalysis sterile, chest x-ray is clear.  CT abdomen unrevealing Will get procalcitonin   >0.12 so low suspicion for bacterial infection Will continue fluids Will hold off on antibiotics until procalcitonin results (0.12)-got cefepime  Flagyl  and vancomycin  in the ED Will continue to trend lactic and WBC with hydration Follow cultures  Chronic diastolic CHF (congestive heart failure) (HCC) Clinically dry Continue home telmisartan  and metoprolol  and hydralazine   Hyponatremia Expecting improvement with IV hydration Continue to monitor  HTN (hypertension) Hydralazine  as needed while unable to tolerate  Dementia (HCC) Delirium precautions Fluoxetine  and lorazepam  if able to tolerate  Hypothyroidism Levothyroxine  if able to tolerate        DVT prophylaxis: Lovenox   Consults: none  Advance Care Planning:   Code Status: Prior   Family Communication: none  Disposition Plan: Back to previous home environment  Severity of Illness: The appropriate patient status for this patient is OBSERVATION. Observation status is judged to be reasonable and necessary in order to provide the required intensity of service to ensure the patient's safety. The patient's presenting symptoms, physical exam findings,  and initial radiographic and laboratory data in the context of their medical condition is felt to place them at decreased risk for further clinical deterioration. Furthermore, it is anticipated that the patient will be medically stable for discharge from the hospital within 2 midnights of admission.   Author: Delayne LULLA Solian, MD 06/12/2024 8:40 PM  For on call review [email or web address].

## 2024-06-12 NOTE — Progress Notes (Signed)
 Elink is following code sepsis.

## 2024-06-12 NOTE — ED Notes (Signed)
 CCMD called at this time to place pt on the cardiac monitor.

## 2024-06-13 ENCOUNTER — Encounter: Payer: Self-pay | Admitting: Internal Medicine

## 2024-06-13 ENCOUNTER — Observation Stay

## 2024-06-13 ENCOUNTER — Other Ambulatory Visit: Payer: Self-pay

## 2024-06-13 DIAGNOSIS — R1115 Cyclical vomiting syndrome unrelated to migraine: Secondary | ICD-10-CM | POA: Diagnosis not present

## 2024-06-13 LAB — LACTIC ACID, PLASMA: Lactic Acid, Venous: 1.2 mmol/L (ref 0.5–1.9)

## 2024-06-13 LAB — COMPREHENSIVE METABOLIC PANEL WITH GFR
ALT: 12 U/L (ref 0–44)
AST: 22 U/L (ref 15–41)
Albumin: 3.1 g/dL — ABNORMAL LOW (ref 3.5–5.0)
Alkaline Phosphatase: 37 U/L — ABNORMAL LOW (ref 38–126)
Anion gap: 11 (ref 5–15)
BUN: 9 mg/dL (ref 8–23)
CO2: 23 mmol/L (ref 22–32)
Calcium: 8.7 mg/dL — ABNORMAL LOW (ref 8.9–10.3)
Chloride: 101 mmol/L (ref 98–111)
Creatinine, Ser: 1.01 mg/dL — ABNORMAL HIGH (ref 0.44–1.00)
GFR, Estimated: 56 mL/min — ABNORMAL LOW (ref >60)
Glucose, Bld: 89 mg/dL (ref 70–99)
Potassium: 3.1 mmol/L — ABNORMAL LOW (ref 3.5–5.1)
Sodium: 135 mmol/L (ref 135–145)
Total Bilirubin: 0.8 mg/dL (ref 0.0–1.2)
Total Protein: 6 g/dL — ABNORMAL LOW (ref 6.5–8.1)

## 2024-06-13 LAB — URINALYSIS, W/ REFLEX TO CULTURE (INFECTION SUSPECTED)
Bilirubin Urine: NEGATIVE
Glucose, UA: NEGATIVE mg/dL
Hgb urine dipstick: NEGATIVE
Ketones, ur: NEGATIVE mg/dL
Leukocytes,Ua: NEGATIVE
Nitrite: NEGATIVE
Protein, ur: NEGATIVE mg/dL
Specific Gravity, Urine: 1.016 (ref 1.005–1.030)
pH: 6 (ref 5.0–8.0)

## 2024-06-13 LAB — CBC
HCT: 30.4 % — ABNORMAL LOW (ref 36.0–46.0)
Hemoglobin: 10.2 g/dL — ABNORMAL LOW (ref 12.0–15.0)
MCH: 31.3 pg (ref 26.0–34.0)
MCHC: 33.6 g/dL (ref 30.0–36.0)
MCV: 93.3 fL (ref 80.0–100.0)
Platelets: 213 K/uL (ref 150–400)
RBC: 3.26 MIL/uL — ABNORMAL LOW (ref 3.87–5.11)
RDW: 13.8 % (ref 11.5–15.5)
WBC: 13.8 K/uL — ABNORMAL HIGH (ref 4.0–10.5)
nRBC: 0 % (ref 0.0–0.2)

## 2024-06-13 LAB — PROTIME-INR
INR: 1.5 — ABNORMAL HIGH (ref 0.8–1.2)
Prothrombin Time: 18.6 s — ABNORMAL HIGH (ref 11.4–15.2)

## 2024-06-13 LAB — GLUCOSE, CAPILLARY: Glucose-Capillary: 117 mg/dL — ABNORMAL HIGH (ref 70–99)

## 2024-06-13 MED ORDER — SODIUM CHLORIDE 0.9 % IV SOLN
1.0000 g | INTRAVENOUS | Status: DC
  Administered 2024-06-13 – 2024-06-15 (×3): 1 g via INTRAVENOUS
  Filled 2024-06-13 (×4): qty 10

## 2024-06-13 MED ORDER — SODIUM CHLORIDE 0.9 % IV SOLN: INTRAVENOUS | Status: DC

## 2024-06-13 MED ORDER — POTASSIUM CHLORIDE CRYS ER 20 MEQ PO TBCR
40.0000 meq | EXTENDED_RELEASE_TABLET | Freq: Once | ORAL | Status: AC
  Administered 2024-06-13: 40 meq via ORAL
  Filled 2024-06-13: qty 2

## 2024-06-13 MED ORDER — METOPROLOL TARTRATE 5 MG/5ML IV SOLN
5.0000 mg | Freq: Four times a day (QID) | INTRAVENOUS | Status: DC | PRN
  Administered 2024-06-14: 5 mg via INTRAVENOUS
  Filled 2024-06-13: qty 5

## 2024-06-13 MED ORDER — SODIUM CHLORIDE 0.9 % IV SOLN
100.0000 mg | Freq: Two times a day (BID) | INTRAVENOUS | Status: DC
  Administered 2024-06-13 – 2024-06-16 (×6): 100 mg via INTRAVENOUS
  Filled 2024-06-13 (×7): qty 100

## 2024-06-13 NOTE — Assessment & Plan Note (Addendum)
 IV hydration IV Protonix  Scheduled Reglan  with as needed antiemetics Clear liquids as tolerated Can consider GI consult eval

## 2024-06-13 NOTE — Plan of Care (Signed)

## 2024-06-13 NOTE — Progress Notes (Signed)
  Chaplain On-Call responded to Rapid Response notification at 1340 hours.  The patient was receiving bedside care in order to stabilize her medical condition.  Chaplain assured Staff of availability.  Chaplain Bebe Ardean EMERSON Hershal., [facility]

## 2024-06-13 NOTE — Progress Notes (Addendum)
 PROGRESS NOTE    Cindy Huerta  FMW:[id number] DOB: 1946/10/26 DOA: 06/12/2024 PCP: Glover [name], MD  Assessment & Plan:   Principal Problem:   Cyclical vomiting with nausea Active Problems:   Leukocytosis   Lactic acidosis   Hyponatremia   Chronic diastolic CHF (congestive heart failure) (HCC)   Headache   HTN (hypertension)   Dementia (HCC)   Hypothyroidism   Cyclical vomiting  Assessment and Plan:  Intractable nausea & vomiting: etiology unclear. CT abd/plevis shows left colonic diverticulosis & NO active diverticulitis & no acute findings. Zofran  prn. Continue on IVFs. Failed to tolerate CLD today so back to NPO w/ sips w/ meds & ice chips  Lactic acidosis: etiology unclear, ? Infection. Repeat lactic acid ordered  Leukocytosis: trending down. Etiology unclear. Concern for infection so restart abxs w/ IV rocpehin, doxy. Blood cxs NGTD. CXR was neg. CT abd/plevis was neg. UA is shows rare bacteria.   Anion gap metabolic acidosis: resolved   Headache: no c/o headache today so far    Chronic diastolic CHF: holding home telmisartan , metoprolol , hydralazine  as pt has N/V. IV lopressor  prn. Monitor I/Os   Hyponatremia: WNL today    HTN: holding home telmisartan , metopotrolol, hydralazine .    Dementia: continue w/ supportive care    Hypothyroidism: continue on levothyroxine  if able to tolerate   Hypokalemia: potassium given    DVT prophylaxis: lovenox   Code Status: DNR Family Communication:  Disposition Plan: depends on PT/OT recs (not consulted yet)  Level of care: Telemetry Medical Consultants:    Procedures:   Antimicrobials: rocephin , doxy   Subjective: Pt c/o nausea & vomiting   Objective: Vitals:   06/13/24 0330 06/13/24 0345 06/13/24 0540 06/13/24 0830  BP: (!) 114/55  130/73 (!) 145/57  Pulse:  100 99 90  Resp: (!) 23 16 20 15   Temp:   98.5 F (36.9 C) 98.9 F (37.2 C)  TempSrc:   Oral Oral  SpO2: 98% 100% 100% 100%  Weight:   71 kg    Height:   5' 6 (1.676 m)     Intake/Output Summary (Last 24 hours) at 06/13/2024 1111 Last data filed at 06/13/2024 0607 Gross per 24 hour  Intake 0 ml  Output --  Net 0 ml   Filed Weights   06/13/24 0540  Weight: 71 kg    Examination:  General exam: Appears calm and comfortable  Respiratory system: Clear to auscultation. Respiratory effort normal. Cardiovascular system: S1 & S2+. No rubs, gallops or clicks.  Gastrointestinal system: Abdomen is nondistended, soft and nontender.Normal bowel sounds heard. Central nervous system: Alert and oriented. Moves all extremities  Psychiatry: Judgement and insight appear normal. Flat mood and affect    Data Reviewed: I have personally reviewed following labs and imaging studies  CBC: Recent Labs  Lab 06/12/24 1651 06/13/24 0511  WBC 19.0* 13.8*  NEUTROABS 16.9*  --   HGB 12.8 10.2*  HCT 37.0 30.4*  MCV 89.4 93.3  PLT 313 213   Basic Metabolic Panel: Recent Labs  Lab 06/12/24 1651 06/13/24 0428  NA 132* 135  K 3.5 3.1*  CL 94* 101  CO2 18* 23  GLUCOSE 153* 89  BUN 10 9  CREATININE 1.26* 1.01*  CALCIUM  9.9 8.7*   GFR: Estimated Creatinine Clearance: 41.6 mL/min (A) (by C-G formula based on SCr of 1.01 mg/dL (H)). Liver Function Tests: Recent Labs  Lab 06/12/24 1651 06/13/24 0428  AST 56* 22  ALT 18 12  ALKPHOS 60 37*  BILITOT  1.4* 0.8  PROT 8.7* 6.0*  ALBUMIN 4.2 3.1*   Recent Labs  Lab 06/12/24 1651  LIPASE 22   No results for input(s): AMMONIA in the last 168 hours. Coagulation Profile: Recent Labs  Lab 06/13/24 0118  INR 1.5*   Cardiac Enzymes: No results for input(s): CKTOTAL, CKMB, CKMBINDEX, TROPONINI in the last 168 hours. BNP (last 3 results) No results for input(s): PROBNP in the last 8760 hours. HbA1C: No results for input(s): HGBA1C in the last 72 hours. CBG: No results for input(s): GLUCAP in the last 168 hours. Lipid Profile: No results for input(s): CHOL,  HDL, LDLCALC, TRIG, CHOLHDL, LDLDIRECT in the last 72 hours. Thyroid  Function Tests: No results for input(s): TSH, T4TOTAL, FREET4, T3FREE, THYROIDAB in the last 72 hours. Anemia Panel: No results for input(s): VITAMINB12, FOLATE, FERRITIN, TIBC, IRON , RETICCTPCT in the last 72 hours. Sepsis Labs: Recent Labs  Lab 06/12/24 1651 06/12/24 2338  PROCALCITON 0.12  --   LATICACIDVEN 4.5* 1.5    Recent Results (from the past 240 hours)  Culture, blood (Routine x 2)     Status: None (Preliminary result)   Collection Time: 06/12/24  4:51 PM   Specimen: BLOOD  Result Value Ref Range Status   Specimen Description BLOOD RIGHT ANTECUBITAL  Final   Special Requests   Final    BOTTLES DRAWN AEROBIC ONLY Blood Culture results may not be optimal due to an inadequate volume of blood received in culture bottles   Culture   Final    NO GROWTH < 24 HOURS Performed at [facility], [address], [place], [place] [address]    Report Status PENDING  Incomplete  Culture, blood (Routine x 2)     Status: None (Preliminary result)   Collection Time: 06/12/24  6:40 PM   Specimen: BLOOD  Result Value Ref Range Status   Specimen Description BLOOD BLOOD RIGHT WRIST  Final   Special Requests   Final    BOTTLES DRAWN AEROBIC AND ANAEROBIC Blood Culture results may not be optimal due to an inadequate volume of blood received in culture bottles   Culture   Final    NO GROWTH < 24 HOURS Performed at [facility], [address], [place], [place] [address]    Report Status PENDING  Incomplete         Radiology Studies: CT ABDOMEN PELVIS W CONTRAST Result Date: 06/12/2024 CLINICAL DATA:  Abdominal pain.  Sepsis. EXAM: CT ABDOMEN AND PELVIS WITH CONTRAST TECHNIQUE: Multidetector CT imaging of the abdomen and pelvis was performed using the standard protocol following bolus administration of intravenous contrast. RADIATION DOSE REDUCTION: This exam was  performed according to the departmental dose-optimization program which includes automated exposure control, adjustment of the mA and/or kV according to patient size and/or use of iterative reconstruction technique. CONTRAST:  80mL OMNIPAQUE  IOHEXOL  300 MG/ML  SOLN COMPARISON:  05/24/2024 FINDINGS: Lower chest: Prior left mastectomy. No acute findings. Coronary artery and aortic atherosclerosis. Hepatobiliary: No focal liver abnormality is seen. Status post cholecystectomy. No biliary dilatation. Pancreas: No focal abnormality or ductal dilatation. Spleen: No focal abnormality.  Normal size. Adrenals/Urinary Tract: No adrenal abnormality. No focal renal abnormality. No stones or hydronephrosis. Urinary bladder is unremarkable. Stomach/Bowel: Left colonic diverticulosis. No active diverticulitis. Stomach and small bowel decompressed. No bowel obstruction or inflammatory process. Vascular/Lymphatic: Aortic atherosclerosis. No evidence of aneurysm or adenopathy. Reproductive: Prior hysterectomy.  No adnexal masses. Other: No free fluid or free air. Musculoskeletal: No acute bony abnormality. IMPRESSION:  Left colonic diverticulosis.  No active diverticulitis. Aortic atherosclerosis. No acute findings. Electronically Signed   By: Franky Crease M.D.   On: 06/12/2024 19:34   CT Head Wo Contrast Result Date: 06/12/2024 CLINICAL DATA:  Headache EXAM: CT HEAD WITHOUT CONTRAST TECHNIQUE: Contiguous axial images were obtained from the base of the skull through the vertex without intravenous contrast. RADIATION DOSE REDUCTION: This exam was performed according to the departmental dose-optimization program which includes automated exposure control, adjustment of the mA and/or kV according to patient size and/or use of iterative reconstruction technique. COMPARISON:  04/29/2024 FINDINGS: Brain: Old right caudate head lacunar infarct, stable. There is atrophy and chronic small vessel disease changes. No acute intracranial  abnormality. Specifically, no hemorrhage, hydrocephalus, mass lesion, acute infarction, or significant intracranial injury. Vascular: No hyperdense vessel or unexpected calcification. Skull: No acute calvarial abnormality. Sinuses/Orbits: No acute findings Other: None IMPRESSION: Atrophy, chronic microvascular disease. No acute intracranial abnormality. Electronically Signed   By: Franky Crease M.D.   On: 06/12/2024 19:32   DG Chest Port 1 View Result Date: 06/12/2024 CLINICAL DATA:  [id number] Sepsis [facility]) [id number] EXAM: PORTABLE CHEST - 1 VIEW COMPARISON:  May 24, 2024 FINDINGS: Lower lung volumes. No focal airspace consolidation, pleural effusion, or pneumothorax. No cardiomegaly. Tortuous aorta with aortic atherosclerosis. No acute fracture or destructive lesions. Multilevel thoracic osteophytosis. Bilateral AC joint osteoarthritis. IMPRESSION: No acute cardiopulmonary abnormality. Electronically Signed   By: Rogelia Myers M.D.   On: 06/12/2024 17:08        Scheduled Meds:  aspirin  EC  81 mg Oral QHS   enoxaparin  (LOVENOX ) injection  40 mg Subcutaneous Q24H   hydrALAZINE   75 mg Oral TID   levothyroxine   50 mcg Oral QAC breakfast   magnesium  oxide  400 mg Oral QHS   pantoprazole  (PROTONIX ) IV  40 mg Intravenous Q24H   sodium chloride  flush  10-40 mL Intracatheter Q12H   Continuous Infusions:   LOS: 0 days       Anthony CHRISTELLA Pouch, MD [facility] Pager [phone]  If 7PM-7AM, please contact night-coverage [email or web address] 06/13/2024, 11:11 AM

## 2024-06-13 NOTE — Care Management Obs Status (Signed)
 MEDICARE OBSERVATION STATUS NOTIFICATION   Patient Details  Name: Cindy Huerta MRN: [id number] Date of Birth: Apr 23, 1946   Medicare Observation Status Notification Given:  No (patient's son did not want a copy)    Rojelio [name] 06/13/2024, 4:07 PM

## 2024-06-13 NOTE — ED Notes (Signed)
 This tech called [facility] to inform them that pt is on the way to rm 215.

## 2024-06-13 NOTE — Progress Notes (Signed)
 Rapid Response Event Note   Reason for Call : left neck pain and tremor   Initial Focused Assessment: Upon arrival patient in bed holding emesis bag.  Primary nurse and charge nurse at bedside and had obtained first set of vitals, CBG, and 12 lead EKG.  Dr. Trudy notified      Interventions: Patient neuro intact and able to answer all questions asked appropriately.  Repeat vitals stable, see flowsheet   Plan of Care: No changes made    Event Summary: Dr. Trudy at bedside and verbalized no need for rapid response and patient to remain in room.  MD Notified: 13:44 Call Time:13:40 Arrival Time:13:41 End Time: 13:50  Vernell Verla Horseman, RN

## 2024-06-14 DIAGNOSIS — F0390 Unspecified dementia without behavioral disturbance: Secondary | ICD-10-CM | POA: Diagnosis present

## 2024-06-14 DIAGNOSIS — Z888 Allergy status to other drugs, medicaments and biological substances status: Secondary | ICD-10-CM | POA: Diagnosis not present

## 2024-06-14 DIAGNOSIS — R519 Headache, unspecified: Secondary | ICD-10-CM | POA: Diagnosis present

## 2024-06-14 DIAGNOSIS — E785 Hyperlipidemia, unspecified: Secondary | ICD-10-CM | POA: Diagnosis present

## 2024-06-14 DIAGNOSIS — I5032 Chronic diastolic (congestive) heart failure: Secondary | ICD-10-CM | POA: Diagnosis present

## 2024-06-14 DIAGNOSIS — K5730 Diverticulosis of large intestine without perforation or abscess without bleeding: Secondary | ICD-10-CM | POA: Diagnosis present

## 2024-06-14 DIAGNOSIS — D72829 Elevated white blood cell count, unspecified: Secondary | ICD-10-CM | POA: Diagnosis present

## 2024-06-14 DIAGNOSIS — F32A Depression, unspecified: Secondary | ICD-10-CM | POA: Diagnosis present

## 2024-06-14 DIAGNOSIS — Z66 Do not resuscitate: Secondary | ICD-10-CM | POA: Diagnosis present

## 2024-06-14 DIAGNOSIS — I2510 Atherosclerotic heart disease of native coronary artery without angina pectoris: Secondary | ICD-10-CM | POA: Diagnosis present

## 2024-06-14 DIAGNOSIS — E876 Hypokalemia: Secondary | ICD-10-CM | POA: Diagnosis present

## 2024-06-14 DIAGNOSIS — K219 Gastro-esophageal reflux disease without esophagitis: Secondary | ICD-10-CM | POA: Diagnosis present

## 2024-06-14 DIAGNOSIS — E039 Hypothyroidism, unspecified: Secondary | ICD-10-CM | POA: Diagnosis present

## 2024-06-14 DIAGNOSIS — Z79890 Hormone replacement therapy (postmenopausal): Secondary | ICD-10-CM | POA: Diagnosis not present

## 2024-06-14 DIAGNOSIS — Z7982 Long term (current) use of aspirin: Secondary | ICD-10-CM | POA: Diagnosis not present

## 2024-06-14 DIAGNOSIS — I110 Hypertensive heart disease with heart failure: Secondary | ICD-10-CM | POA: Diagnosis present

## 2024-06-14 DIAGNOSIS — Z881 Allergy status to other antibiotic agents status: Secondary | ICD-10-CM | POA: Diagnosis not present

## 2024-06-14 DIAGNOSIS — Z9012 Acquired absence of left breast and nipple: Secondary | ICD-10-CM | POA: Diagnosis not present

## 2024-06-14 DIAGNOSIS — Z8249 Family history of ischemic heart disease and other diseases of the circulatory system: Secondary | ICD-10-CM | POA: Diagnosis not present

## 2024-06-14 DIAGNOSIS — R1115 Cyclical vomiting syndrome unrelated to migraine: Secondary | ICD-10-CM | POA: Diagnosis present

## 2024-06-14 DIAGNOSIS — E871 Hypo-osmolality and hyponatremia: Secondary | ICD-10-CM | POA: Diagnosis present

## 2024-06-14 DIAGNOSIS — E872 Acidosis, unspecified: Secondary | ICD-10-CM | POA: Diagnosis present

## 2024-06-14 DIAGNOSIS — Z91040 Latex allergy status: Secondary | ICD-10-CM | POA: Diagnosis not present

## 2024-06-14 LAB — TROPONIN I (HIGH SENSITIVITY)
Troponin I (High Sensitivity): 10 ng/L (ref <18)
Troponin I (High Sensitivity): 11 ng/L (ref <18)

## 2024-06-14 LAB — CBC
HCT: 30.8 % — ABNORMAL LOW (ref 36.0–46.0)
Hemoglobin: 10.6 g/dL — ABNORMAL LOW (ref 12.0–15.0)
MCH: 31.2 pg (ref 26.0–34.0)
MCHC: 34.4 g/dL (ref 30.0–36.0)
MCV: 90.6 fL (ref 80.0–100.0)
Platelets: 212 K/uL (ref 150–400)
RBC: 3.4 MIL/uL — ABNORMAL LOW (ref 3.87–5.11)
RDW: 13.7 % (ref 11.5–15.5)
WBC: 10.1 K/uL (ref 4.0–10.5)
nRBC: 0 % (ref 0.0–0.2)

## 2024-06-14 LAB — BASIC METABOLIC PANEL WITH GFR
Anion gap: 12 (ref 5–15)
BUN: 8 mg/dL (ref 8–23)
CO2: 23 mmol/L (ref 22–32)
Calcium: 8.8 mg/dL — ABNORMAL LOW (ref 8.9–10.3)
Chloride: 101 mmol/L (ref 98–111)
Creatinine, Ser: 0.87 mg/dL (ref 0.44–1.00)
GFR, Estimated: >60 mL/min (ref >60)
Glucose, Bld: 87 mg/dL (ref 70–99)
Potassium: 3 mmol/L — ABNORMAL LOW (ref 3.5–5.1)
Sodium: 136 mmol/L (ref 135–145)

## 2024-06-14 LAB — MAGNESIUM: Magnesium: 1.4 mg/dL — ABNORMAL LOW (ref 1.7–2.4)

## 2024-06-14 MED ORDER — SCOPOLAMINE 1 MG/3DAYS TD PT72
1.0000 | MEDICATED_PATCH | TRANSDERMAL | Status: DC
  Administered 2024-06-14: 1.5 mg via TRANSDERMAL
  Filled 2024-06-14: qty 1

## 2024-06-14 MED ORDER — ONDANSETRON HCL 4 MG/2ML IJ SOLN
4.0000 mg | Freq: Four times a day (QID) | INTRAMUSCULAR | Status: DC | PRN
  Administered 2024-06-14: 4 mg via INTRAVENOUS
  Filled 2024-06-14: qty 2

## 2024-06-14 MED ORDER — METOPROLOL SUCCINATE ER 50 MG PO TB24
50.0000 mg | ORAL_TABLET | Freq: Two times a day (BID) | ORAL | Status: DC
  Administered 2024-06-14 – 2024-06-16 (×4): 50 mg via ORAL
  Filled 2024-06-14 (×4): qty 1

## 2024-06-14 MED ORDER — FLUOXETINE HCL 10 MG PO CAPS
10.0000 mg | ORAL_CAPSULE | Freq: Every day | ORAL | Status: DC
  Administered 2024-06-14 – 2024-06-16 (×3): 10 mg via ORAL
  Filled 2024-06-14 (×4): qty 1

## 2024-06-14 MED ORDER — MAGNESIUM SULFATE 2 GM/50ML IV SOLN
2.0000 g | Freq: Once | INTRAVENOUS | Status: AC
  Administered 2024-06-14: 2 g via INTRAVENOUS
  Filled 2024-06-14: qty 50

## 2024-06-14 MED ORDER — POTASSIUM CHLORIDE CRYS ER 20 MEQ PO TBCR
40.0000 meq | EXTENDED_RELEASE_TABLET | Freq: Two times a day (BID) | ORAL | Status: AC
  Administered 2024-06-14 (×2): 40 meq via ORAL
  Filled 2024-06-14 (×2): qty 2

## 2024-06-14 NOTE — Progress Notes (Signed)
   06/14/24 1153  Assess: MEWS Score  Temp 99.1 F (37.3 C)  BP 132/82  MAP (mmHg) 94  Pulse Rate (!) 104  Resp 16  SpO2 100 %  O2 Device Room Air  Assess: MEWS Score  MEWS Temp 0  MEWS Systolic 0  MEWS Pulse 1  MEWS RR 0  MEWS LOC 0  MEWS Score 1  MEWS Score Color Green  Notify: Charge Nurse/RN  Name of Charge Nurse/RN Barrister's clerk  Assess: SIRS CRITERIA  SIRS Temperature  0  SIRS Respirations  0  SIRS Pulse 1  SIRS WBC 0  SIRS Score Sum  1

## 2024-06-14 NOTE — Progress Notes (Signed)
 PROGRESS NOTE    Cindy Huerta  FMW:[id number] DOB: [birth year] DOA: 06/12/2024 PCP: Glover [name], MD  Assessment & Plan:   Principal Problem:   Cyclical vomiting with nausea Active Problems:   Leukocytosis   Lactic acidosis   Hyponatremia   Chronic diastolic CHF (congestive heart failure) (HCC)   Headache   HTN (hypertension)   Dementia (HCC)   Hypothyroidism   Intractable nausea and vomiting   Cyclical vomiting  Assessment and Plan:  Intractable nausea & vomiting: etiology unclear. CT abd/plevis shows left colonic diverticulosis & NO active diverticulitis & no acute findings. Zofran  prn. Will start scopolamine  patch. Continue on IVFs. Will consider GI consult tomorrow if still no improvement   Sinus tachycardia: no hx of a.fib/flutter. Into 160-180s, nonsustained. Restart home dose of metoprolol . Continue on tele.   Lactic acidosis: etiology unclear, ? Infection. Resolved   Leukocytosis: WNL today. Concern for infection so restart abxs w/ IV rocpehin, doxy. Blood cxs NGTD. CXR was neg. CT abd/plevis was neg. UA is shows rare bacteria. Resolved   Anion gap metabolic acidosis: resolved   Headache: no c/o headache today so far    Chronic diastolic CHF: restarting home dose of metoprolol . Holding home telmisartan , hydralazine . Monitor I/Os. Repeat echo ordered. Troponin neg x 2.    Hyponatremia: WNL today    HTN: will restart home dose of metoprolol . Holding home telmisartan , hydralazine     Dementia: continue w/ supportive care    Hypothyroidism: continue on levothyroxine     Hypokalemia: potassium given   Hypomagnesemia: mg sulfate ordered    DVT prophylaxis: lovenox   Code Status: DNR Family Communication: called pt's son, Marcey, but no answer so left a voicemail Disposition Plan: depends on PT/OT recs (not consulted yet)  Level of care: Telemetry Medical Consultants:    Procedures:   Antimicrobials: rocephin , doxy   Subjective: Pt c/o nausea &  vomiting   Objective: Vitals:   06/13/24 1549 06/13/24 2111 06/14/24 0423 06/14/24 0829  BP: (!) 160/78 (!) 157/65 (!) 154/64 (!) 138/57  Pulse: 96 (!) 105  (!) 107  Resp: 16 16 20 14   Temp: 99.1 F (37.3 C) 99 F (37.2 C) 99 F (37.2 C) 99.1 F (37.3 C)  TempSrc: Oral  Oral Oral  SpO2: 100%  100% 93%  Weight:      Height:        Intake/Output Summary (Last 24 hours) at 06/14/2024 0918 Last data filed at 06/14/2024 0800 Gross per 24 hour  Intake 1194.3 ml  Output 900 ml  Net 294.3 ml   Filed Weights   06/13/24 0540  Weight: 71 kg    Examination:  General exam: Appears uncomfortable  Respiratory system: clear breath sounds b/l  Cardiovascular system: S1/S2+. No rubs or clicks Gastrointestinal system: Abd is soft, NT, ND & hypoactive bowel sounds Central nervous system: Alert & oriented. Moves all extremities Psychiatry: Judgement and insight appears at baseline. Flat mood and affect    Data Reviewed: I have personally reviewed following labs and imaging studies  CBC: Recent Labs  Lab 06/12/24 1651 06/13/24 0511 06/14/24 0510  WBC 19.0* 13.8* 10.1  NEUTROABS 16.9*  --   --   HGB 12.8 10.2* 10.6*  HCT 37.0 30.4* 30.8*  MCV 89.4 93.3 90.6  PLT 313 213 212   Basic Metabolic Panel: Recent Labs  Lab 06/12/24 1651 06/13/24 0428 06/14/24 0510  NA 132* 135 136  K 3.5 3.1* 3.0*  CL 94* 101 101  CO2 18* 23 23  GLUCOSE  153* 89 87  BUN 10 9 8   CREATININE 1.26* 1.01* 0.87  CALCIUM  9.9 8.7* 8.8*   GFR: Estimated Creatinine Clearance: 48.3 mL/min (by C-G formula based on SCr of 0.87 mg/dL). Liver Function Tests: Recent Labs  Lab 06/12/24 1651 06/13/24 0428  AST 56* 22  ALT 18 12  ALKPHOS 60 37*  BILITOT 1.4* 0.8  PROT 8.7* 6.0*  ALBUMIN 4.2 3.1*   Recent Labs  Lab 06/12/24 1651  LIPASE 22   No results for input(s): AMMONIA in the last 168 hours. Coagulation Profile: Recent Labs  Lab 06/13/24 0118  INR 1.5*   Cardiac Enzymes: No results  for input(s): CKTOTAL, CKMB, CKMBINDEX, TROPONINI in the last 168 hours. BNP (last 3 results) No results for input(s): PROBNP in the last 8760 hours. HbA1C: No results for input(s): HGBA1C in the last 72 hours. CBG: Recent Labs  Lab 06/13/24 1327  GLUCAP 117*   Lipid Profile: No results for input(s): CHOL, HDL, LDLCALC, TRIG, CHOLHDL, LDLDIRECT in the last 72 hours. Thyroid  Function Tests: No results for input(s): TSH, T4TOTAL, FREET4, T3FREE, THYROIDAB in the last 72 hours. Anemia Panel: No results for input(s): VITAMINB12, FOLATE, FERRITIN, TIBC, IRON , RETICCTPCT in the last 72 hours. Sepsis Labs: Recent Labs  Lab 06/12/24 1651 06/12/24 2338 06/13/24 1814  PROCALCITON 0.12  --   --   LATICACIDVEN 4.5* 1.5 1.2    Recent Results (from the past 240 hours)  Culture, blood (Routine x 2)     Status: None (Preliminary result)   Collection Time: 06/12/24  4:51 PM   Specimen: BLOOD  Result Value Ref Range Status   Specimen Description BLOOD RIGHT ANTECUBITAL  Final   Special Requests   Final    BOTTLES DRAWN AEROBIC ONLY Blood Culture results may not be optimal due to an inadequate volume of blood received in culture bottles   Culture   Final    NO GROWTH < 24 HOURS Performed at [facility], [address], [place], [place] [address]    Report Status PENDING  Incomplete  Culture, blood (Routine x 2)     Status: None (Preliminary result)   Collection Time: 06/12/24  6:40 PM   Specimen: BLOOD  Result Value Ref Range Status   Specimen Description BLOOD BLOOD RIGHT WRIST  Final   Special Requests   Final    BOTTLES DRAWN AEROBIC AND ANAEROBIC Blood Culture results may not be optimal due to an inadequate volume of blood received in culture bottles   Culture   Final    NO GROWTH < 24 HOURS Performed at [facility], [address], [place], [place] [address]    Report Status PENDING  Incomplete          Radiology Studies: DG Abd Portable 1V Result Date: 06/13/2024 CLINICAL DATA:  Nausea and vomiting. EXAM: PORTABLE ABDOMEN - 1 VIEW COMPARISON:  Abdominal radiograph dated 07/29/2022. FINDINGS: No bowel dilatation or evidence of obstruction. No free air. Calculi. Right upper quadrant cholecystectomy clips. Osteopenia with degenerative changes. No acute osseous pathology. IMPRESSION: Nonobstructive bowel gas pattern. Electronically Signed   By: Vanetta Chou M.D.   On: 06/13/2024 18:13   CT ABDOMEN PELVIS W CONTRAST Result Date: 06/12/2024 CLINICAL DATA:  Abdominal pain.  Sepsis. EXAM: CT ABDOMEN AND PELVIS WITH CONTRAST TECHNIQUE: Multidetector CT imaging of the abdomen and pelvis was performed using the standard protocol following bolus administration of intravenous contrast. RADIATION DOSE REDUCTION: This exam was performed according to the departmental dose-optimization program  which includes automated exposure control, adjustment of the mA and/or kV according to patient size and/or use of iterative reconstruction technique. CONTRAST:  80mL OMNIPAQUE  IOHEXOL  300 MG/ML  SOLN COMPARISON:  05/24/2024 FINDINGS: Lower chest: Prior left mastectomy. No acute findings. Coronary artery and aortic atherosclerosis. Hepatobiliary: No focal liver abnormality is seen. Status post cholecystectomy. No biliary dilatation. Pancreas: No focal abnormality or ductal dilatation. Spleen: No focal abnormality.  Normal size. Adrenals/Urinary Tract: No adrenal abnormality. No focal renal abnormality. No stones or hydronephrosis. Urinary bladder is unremarkable. Stomach/Bowel: Left colonic diverticulosis. No active diverticulitis. Stomach and small bowel decompressed. No bowel obstruction or inflammatory process. Vascular/Lymphatic: Aortic atherosclerosis. No evidence of aneurysm or adenopathy. Reproductive: Prior hysterectomy.  No adnexal masses. Other: No free fluid or free air. Musculoskeletal: No acute bony  abnormality. IMPRESSION: Left colonic diverticulosis.  No active diverticulitis. Aortic atherosclerosis. No acute findings. Electronically Signed   By: Franky Crease M.D.   On: 06/12/2024 19:34   CT Head Wo Contrast Result Date: 06/12/2024 CLINICAL DATA:  Headache EXAM: CT HEAD WITHOUT CONTRAST TECHNIQUE: Contiguous axial images were obtained from the base of the skull through the vertex without intravenous contrast. RADIATION DOSE REDUCTION: This exam was performed according to the departmental dose-optimization program which includes automated exposure control, adjustment of the mA and/or kV according to patient size and/or use of iterative reconstruction technique. COMPARISON:  04/29/2024 FINDINGS: Brain: Old right caudate head lacunar infarct, stable. There is atrophy and chronic small vessel disease changes. No acute intracranial abnormality. Specifically, no hemorrhage, hydrocephalus, mass lesion, acute infarction, or significant intracranial injury. Vascular: No hyperdense vessel or unexpected calcification. Skull: No acute calvarial abnormality. Sinuses/Orbits: No acute findings Other: None IMPRESSION: Atrophy, chronic microvascular disease. No acute intracranial abnormality. Electronically Signed   By: Franky Crease M.D.   On: 06/12/2024 19:32   DG Chest Port 1 View Result Date: 06/12/2024 CLINICAL DATA:  [id number] Sepsis [facility]) [id number] EXAM: PORTABLE CHEST - 1 VIEW COMPARISON:  May 24, 2024 FINDINGS: Lower lung volumes. No focal airspace consolidation, pleural effusion, or pneumothorax. No cardiomegaly. Tortuous aorta with aortic atherosclerosis. No acute fracture or destructive lesions. Multilevel thoracic osteophytosis. Bilateral AC joint osteoarthritis. IMPRESSION: No acute cardiopulmonary abnormality. Electronically Signed   By: Rogelia Myers M.D.   On: 06/12/2024 17:08        Scheduled Meds:  aspirin  EC  81 mg Oral QHS   enoxaparin  (LOVENOX ) injection  40 mg Subcutaneous Q24H   hydrALAZINE    75 mg Oral TID   levothyroxine   50 mcg Oral QAC breakfast   magnesium  oxide  400 mg Oral QHS   pantoprazole  (PROTONIX ) IV  40 mg Intravenous Q24H   potassium chloride   40 mEq Oral BID   sodium chloride  flush  10-40 mL Intracatheter Q12H   Continuous Infusions:  sodium chloride  75 mL/hr at 06/13/24 1150   cefTRIAXone  (ROCEPHIN )  IV 1 g (06/13/24 1423)   doxycycline  (VIBRAMYCIN ) IV 100 mg (06/14/24 0154)     LOS: 0 days       Anthony CHRISTELLA Pouch, MD [facility] Pager [phone]  If 7PM-7AM, please contact night-coverage [email or web address] 06/14/2024, 9:18 AM

## 2024-06-14 NOTE — Plan of Care (Signed)

## 2024-06-14 NOTE — Plan of Care (Signed)
   Problem: Education: Goal: Knowledge of General Education information will improve Description Including pain rating scale, medication(s)/side effects and non-pharmacologic comfort measures Outcome: Progressing

## 2024-06-15 ENCOUNTER — Inpatient Hospital Stay (HOSPITAL_COMMUNITY)
Admit: 2024-06-15 | Discharge: 2024-06-15 | Disposition: A | Discharge status: Home / Self Care | Attending: Internal Medicine | Admitting: Internal Medicine

## 2024-06-15 DIAGNOSIS — R1115 Cyclical vomiting syndrome unrelated to migraine: Secondary | ICD-10-CM | POA: Diagnosis not present

## 2024-06-15 DIAGNOSIS — I5032 Chronic diastolic (congestive) heart failure: Secondary | ICD-10-CM | POA: Diagnosis not present

## 2024-06-15 LAB — CBC
HCT: 29.8 % — ABNORMAL LOW (ref 36.0–46.0)
Hemoglobin: 10.1 g/dL — ABNORMAL LOW (ref 12.0–15.0)
MCH: 31.1 pg (ref 26.0–34.0)
MCHC: 33.9 g/dL (ref 30.0–36.0)
MCV: 91.7 fL (ref 80.0–100.0)
Platelets: 222 K/uL (ref 150–400)
RBC: 3.25 MIL/uL — ABNORMAL LOW (ref 3.87–5.11)
RDW: 13.6 % (ref 11.5–15.5)
WBC: 11.8 K/uL — ABNORMAL HIGH (ref 4.0–10.5)
nRBC: 0 % (ref 0.0–0.2)

## 2024-06-15 LAB — BASIC METABOLIC PANEL WITH GFR
Anion gap: 7 (ref 5–15)
BUN: 6 mg/dL — ABNORMAL LOW (ref 8–23)
CO2: 25 mmol/L (ref 22–32)
Calcium: 8.4 mg/dL — ABNORMAL LOW (ref 8.9–10.3)
Chloride: 105 mmol/L (ref 98–111)
Creatinine, Ser: 1.01 mg/dL — ABNORMAL HIGH (ref 0.44–1.00)
GFR, Estimated: 56 mL/min — ABNORMAL LOW (ref >60)
Glucose, Bld: 99 mg/dL (ref 70–99)
Potassium: 3.7 mmol/L (ref 3.5–5.1)
Sodium: 137 mmol/L (ref 135–145)

## 2024-06-15 LAB — ECHOCARDIOGRAM COMPLETE
Area-P 1/2: 4.68 cm2
Calc EF: 73.5 %
Height: 66 in
S' Lateral: 2.19 cm
Single Plane A2C EF: 80.9 %
Single Plane A4C EF: 68.6 %
Weight: 2504.43 [oz_av]

## 2024-06-15 NOTE — Care Management Important Message (Signed)
 Important Message  Patient Details  Name: Cindy Huerta MRN: [id number] Date of Birth: Sep 12, 1946   Important Message Given:  Yes - Medicare IM     Rojelio [name] 06/15/2024, 1:27 PM

## 2024-06-15 NOTE — Progress Notes (Signed)
 Mobility Specialist - Progress Note   06/15/24 1400  Mobility  Activity Ambulated with assistance in room;Ambulated with assistance to bathroom;Transferred from bed to chair  Level of Assistance Standby assist, set-up cues, supervision of patient - no hands on  Assistive Device Front wheel walker  Distance Ambulated (ft) 15 ft  Activity Response Tolerated well  Mobility visit 1 Mobility     Pt lying in bed upon arrival, utilizing RA. Pt motivated for OOB activity. Completed bed mobility modI. STS with minA and ambulation in room with minG. No LOB. Mildly impulsive. Pt left in chair with alarm set, needs in reach.    Lennette Seip Mobility Specialist 06/15/24, 2:43 PM

## 2024-06-15 NOTE — Progress Notes (Signed)
 PROGRESS NOTE    Cindy Huerta  FMW:[id number] DOB: 1946-05-26 DOA: 06/12/2024 PCP: Glover [name], MD  Assessment & Plan:   Principal Problem:   Cyclical vomiting with nausea Active Problems:   Leukocytosis   Lactic acidosis   Hyponatremia   Chronic diastolic CHF (congestive heart failure) (HCC)   Headache   HTN (hypertension)   Dementia (HCC)   Hypothyroidism   Intractable nausea and vomiting   Cyclical vomiting  Assessment and Plan:  Intractable nausea & vomiting: etiology unclear. CT abd/plevis shows left colonic diverticulosis & NO active diverticulitis & no acute findings. Zofran  prn. Continue w/ scopolamine  patch. Continue on IVFs. Improved today, advanced to full liquid diet for lunch and possible soft diet for dinner   Sinus tachycardia: no hx of a.fib/flutter. Into 160-180s, nonsustained.Continue on home dose of metoprolol . Continue on tele   Lactic acidosis: etiology unclear. Resolved   Leukocytosis: Labile. Continue on IV rocpehin, doxy x 2 days more. Blood cxs NGTD. CXR was neg. CT abd/plevis was neg. UA is shows rare bacteria.  Anion gap metabolic acidosis: resolved   Headache: no c/o headache today so far    Chronic diastolic CHF: continue on home dose of metoprolol . Holding home telmisartan , hydralazine . Monitor I/Os. Repeat echo shows EF 60-65%, grade I diastolic dysfunction, no regional wall motion abnormalities. Troponin neg x 2.    Hyponatremia: resolved    HTN: continue on home dose of metoprolol . Holding home telmisartan , hydralazine     Dementia: continue w/ supportive care    Hypothyroidism: continue on levothyroxine     Hypokalemia: WNL today   Hypomagnesemia: repleated     DVT prophylaxis: lovenox   Code Status: DNR Family Communication:  Disposition Plan: depends on PT/OT recs (not consulted yet)  Level of care: Telemetry Medical Consultants:    Procedures:   Antimicrobials: rocephin , doxy   Subjective: Pt c/o fatigue    Objective: Vitals:   06/14/24 1521 06/14/24 2040 06/15/24 0427 06/15/24 0829  BP: (!) 141/67 (!) 151/68 (!) 142/67 (!) 158/64  Pulse: (!) 104 (!) 109 86 81  Resp: 16 20 20 18   Temp: 98.7 F (37.1 C) 98.2 F (36.8 C) 98.3 F (36.8 C) 97.8 F (36.6 C)  TempSrc:  Oral Oral   SpO2: 100% 100% 99% 98%  Weight:      Height:        Intake/Output Summary (Last 24 hours) at 06/15/2024 1423 Last data filed at 06/15/2024 1300 Gross per 24 hour  Intake 780 ml  Output 1300 ml  Net -520 ml   Filed Weights   06/13/24 0540  Weight: 71 kg    Examination:  General exam: Appears calm & comfortable  Respiratory system: clear breath sounds b/l Cardiovascular system: S1 & S2+. No rubs or clicks Gastrointestinal system: Abd is soft, NT, ND & hypoactive bowel sounds Central nervous system: Alert & oriented. Moves all extremities  Psychiatry: udgement and insight appears at baseline. Flat mood and affect     Data Reviewed: I have personally reviewed following labs and imaging studies  CBC: Recent Labs  Lab 06/12/24 1651 06/13/24 0511 06/14/24 0510 06/15/24 0330  WBC 19.0* 13.8* 10.1 11.8*  NEUTROABS 16.9*  --   --   --   HGB 12.8 10.2* 10.6* 10.1*  HCT 37.0 30.4* 30.8* 29.8*  MCV 89.4 93.3 90.6 91.7  PLT 313 213 212 222   Basic Metabolic Panel: Recent Labs  Lab 06/12/24 1651 06/13/24 0428 06/14/24 0510 06/15/24 0330  NA 132* 135 136 137  K 3.5 3.1* 3.0* 3.7  CL 94* 101 101 105  CO2 18* 23 23 25   GLUCOSE 153* 89 87 99  BUN 10 9 8  6*  CREATININE 1.26* 1.01* 0.87 1.01*  CALCIUM  9.9 8.7* 8.8* 8.4*  MG  --   --  1.4*  --    GFR: Estimated Creatinine Clearance: 41.6 mL/min (A) (by C-G formula based on SCr of 1.01 mg/dL (H)). Liver Function Tests: Recent Labs  Lab 06/12/24 1651 06/13/24 0428  AST 56* 22  ALT 18 12  ALKPHOS 60 37*  BILITOT 1.4* 0.8  PROT 8.7* 6.0*  ALBUMIN 4.2 3.1*   Recent Labs  Lab 06/12/24 1651  LIPASE 22   No results for input(s):  AMMONIA in the last 168 hours. Coagulation Profile: Recent Labs  Lab 06/13/24 0118  INR 1.5*   Cardiac Enzymes: No results for input(s): CKTOTAL, CKMB, CKMBINDEX, TROPONINI in the last 168 hours. BNP (last 3 results) No results for input(s): PROBNP in the last 8760 hours. HbA1C: No results for input(s): HGBA1C in the last 72 hours. CBG: Recent Labs  Lab 06/13/24 1327  GLUCAP 117*   Lipid Profile: No results for input(s): CHOL, HDL, LDLCALC, TRIG, CHOLHDL, LDLDIRECT in the last 72 hours. Thyroid  Function Tests: No results for input(s): TSH, T4TOTAL, FREET4, T3FREE, THYROIDAB in the last 72 hours. Anemia Panel: No results for input(s): VITAMINB12, FOLATE, FERRITIN, TIBC, IRON , RETICCTPCT in the last 72 hours. Sepsis Labs: Recent Labs  Lab 06/12/24 1651 06/12/24 2338 06/13/24 1814  PROCALCITON 0.12  --   --   LATICACIDVEN 4.5* 1.5 1.2    Recent Results (from the past 240 hours)  Culture, blood (Routine x 2)     Status: None (Preliminary result)   Collection Time: 06/12/24  4:51 PM   Specimen: BLOOD  Result Value Ref Range Status   Specimen Description BLOOD RIGHT ANTECUBITAL  Final   Special Requests   Final    BOTTLES DRAWN AEROBIC ONLY Blood Culture results may not be optimal due to an inadequate volume of blood received in culture bottles   Culture   Final    NO GROWTH 3 DAYS Performed at [facility], [address] [place], [place] [address]    Report Status PENDING  Incomplete  Culture, blood (Routine x 2)     Status: None (Preliminary result)   Collection Time: 06/12/24  6:40 PM   Specimen: BLOOD  Result Value Ref Range Status   Specimen Description BLOOD BLOOD RIGHT WRIST  Final   Special Requests   Final    BOTTLES DRAWN AEROBIC AND ANAEROBIC Blood Culture results may not be optimal due to an inadequate volume of blood received in culture bottles   Culture   Final    NO GROWTH 3 DAYS Performed at  [facility], [address], [place], [place] [address]    Report Status PENDING  Incomplete         Radiology Studies: ECHOCARDIOGRAM COMPLETE Result Date: 06/15/2024    ECHOCARDIOGRAM REPORT   Patient Name:   Cindy Huerta Date of Exam: 06/15/2024 Medical Rec #:  [id number]       Height:       66.0 in Accession #:    [id number]      Weight:       156.5 lb Date of Birth:  [birth year]       BSA:          1.802 m Patient Age:    78  years        BP:           158/64 mmHg Patient Gender: F               HR:           82 bpm. Exam Location:  [facility] Procedure: 2D Echo, Cardiac Doppler and Color Doppler (Both Spectral and Color            Flow Doppler were utilized during procedure). Indications:     CHF  History:         Patient has prior history of Echocardiogram examinations, most                  recent 07/29/2021. TIA; Risk Factors:Hypertension and                  Dyslipidemia.  Sonographer:     Philomena Daring Referring Phys:  [id number] ANTHONY CHRISTELLA POUCH Diagnosing Phys: Redell Cave MD IMPRESSIONS  1. Left ventricular ejection fraction, by estimation, is 60 to 65%. The left ventricle has normal function. The left ventricle has no regional wall motion abnormalities. There is mild left ventricular hypertrophy. Left ventricular diastolic parameters are consistent with Grade I diastolic dysfunction (impaired relaxation).  2. Right ventricular systolic function is normal. The right ventricular size is normal.  3. The mitral valve is normal in structure. Mild mitral valve regurgitation.  4. The aortic valve is tricuspid. Aortic valve regurgitation is not visualized.  5. The inferior vena cava is normal in size with greater than 50% respiratory variability, suggesting right atrial pressure of 3 mmHg. FINDINGS  Left Ventricle: Left ventricular ejection fraction, by estimation, is 60 to 65%. The left ventricle has normal function. The left ventricle has no regional wall motion abnormalities. The left  ventricular internal cavity size was normal in size. There is  mild left ventricular hypertrophy. Left ventricular diastolic parameters are consistent with Grade I diastolic dysfunction (impaired relaxation). Right Ventricle: The right ventricular size is normal. No increase in right ventricular wall thickness. Right ventricular systolic function is normal. Left Atrium: Left atrial size was normal in size. Right Atrium: Right atrial size was normal in size. Pericardium: There is no evidence of pericardial effusion. Mitral Valve: The mitral valve is normal in structure. Mild mitral valve regurgitation. Tricuspid Valve: The tricuspid valve is normal in structure. Tricuspid valve regurgitation is mild. Aortic Valve: The aortic valve is tricuspid. Aortic valve regurgitation is not visualized. Pulmonic Valve: The pulmonic valve was normal in structure. Pulmonic valve regurgitation is trivial. Aorta: The aortic root and ascending aorta are structurally normal, with no evidence of dilitation. Venous: The inferior vena cava is normal in size with greater than 50% respiratory variability, suggesting right atrial pressure of 3 mmHg. IAS/Shunts: No atrial level shunt detected by color flow Doppler.  LEFT VENTRICLE PLAX 2D LVIDd:         3.49 cm     Diastology LVIDs:         2.19 cm     LV e' medial:    7.45 cm/s LV PW:         1.08 cm     LV E/e' medial:  11.3 LV IVS:        1.15 cm     LV e' lateral:   10.50 cm/s LVOT diam:     2.00 cm     LV E/e' lateral: 8.0 LV SV:  62 LV SV Index:   34 LVOT Area:     3.14 cm  LV Volumes (MOD) LV vol d, MOD A2C: 61.1 ml LV vol d, MOD A4C: 65.6 ml LV vol s, MOD A2C: 11.7 ml LV vol s, MOD A4C: 20.6 ml LV SV MOD A2C:     49.4 ml LV SV MOD A4C:     65.6 ml LV SV MOD BP:      47.3 ml IVC IVC diam: 1.45 cm LEFT ATRIUM             Index        RIGHT ATRIUM           Index LA Vol (A2C):   14.3 ml 7.94 ml/m   RA Area:     11.80 cm LA Vol (A4C):   30.8 ml 17.09 ml/m  RA Volume:   26.40 ml   14.65 ml/m LA Biplane Vol: 23.0 ml 12.76 ml/m  AORTIC VALVE LVOT Vmax:   89.40 cm/s LVOT Vmean:  66.000 cm/s LVOT VTI:    0.197 m  AORTA Ao Root diam: 2.30 cm Ao Asc diam:  3.10 cm MITRAL VALVE                TRICUSPID VALVE MV Area (PHT): 4.68 cm     TR Peak grad:   25.2 mmHg MV Decel Time: 162 msec     TR Vmax:        251.00 cm/s MV E velocity: 83.90 cm/s MV A velocity: 113.00 cm/s  SHUNTS MV E/A ratio:  0.74         Systemic VTI:  0.20 m                             Systemic Diam: 2.00 cm Redell Cave MD Electronically signed by Redell Cave MD Signature Date/Time: 06/15/2024/1:22:55 PM    Final    DG Abd Portable 1V Result Date: 06/13/2024 CLINICAL DATA:  Nausea and vomiting. EXAM: PORTABLE ABDOMEN - 1 VIEW COMPARISON:  Abdominal radiograph dated 07/29/2022. FINDINGS: No bowel dilatation or evidence of obstruction. No free air. Calculi. Right upper quadrant cholecystectomy clips. Osteopenia with degenerative changes. No acute osseous pathology. IMPRESSION: Nonobstructive bowel gas pattern. Electronically Signed   By: Vanetta Chou M.D.   On: 06/13/2024 18:13        Scheduled Meds:  aspirin  EC  81 mg Oral QHS   enoxaparin  (LOVENOX ) injection  40 mg Subcutaneous Q24H   FLUoxetine   10 mg Oral Daily   hydrALAZINE   75 mg Oral TID   levothyroxine   50 mcg Oral QAC breakfast   metoprolol  succinate  50 mg Oral BID   pantoprazole  (PROTONIX ) IV  40 mg Intravenous Q24H   scopolamine   1 patch Transdermal Q72H   sodium chloride  flush  10-40 mL Intracatheter Q12H   Continuous Infusions:  sodium chloride  75 mL/hr at 06/15/24 9271   cefTRIAXone  (ROCEPHIN )  IV 1 g (06/15/24 1403)   doxycycline  (VIBRAMYCIN ) IV 100 mg (06/15/24 0132)     LOS: 1 day       Anthony CHRISTELLA Pouch, MD [facility] Pager [phone]  If 7PM-7AM, please contact night-coverage [email or web address] 06/15/2024, 2:23 PM

## 2024-06-15 NOTE — Evaluation (Signed)
 Physical Therapy Evaluation Patient Details Name: Cindy Huerta MRN: [id number] DOB: 03-13-1946 Today's Date: 06/15/2024  History of Present Illness  78 y/o female admitted 06/12/24 with unexplained nausea and vomiting, also reporting abdominal pain. Multiple recent admissions for similar presentation. PMH includes arthritis, breast cancer, depression, HLD, HTN, and Hypothyroidism; cholecystectomy; abdominal hysterectomy (1982); total mastectomy (Left, 11/20/2021), CKD stage III, history of GI bleed, TIA, and chronic HFpE  Clinical Impression  Patient admitted with the above. PTA, patient lives with son and daughter in law who both work from home. Per patient, she ambulates with RW vs rollator and is independent with ADLs, however daughter in law provides supervision for showering. Able to complete bed mobility and sit to stand with CGA. Ambulated ~45' with RW and CGA + chair follow for safety as patient reporting feeling weak throughout with delayed responses. BP stable. Unsure about current cognitive status as no family present. Patient will benefit from skilled PT services during acute stay to address listed deficits. Patient will benefit from ongoing therapy at discharge to maximize functional independence and safety.         If plan is discharge home, recommend the following: A little help with walking and/or transfers;A little help with bathing/dressing/bathroom;Help with stairs or ramp for entrance;Assist for transportation;Assistance with cooking/housework;Supervision due to cognitive status   Can travel by private vehicle   Yes    Equipment Recommendations None recommended by PT  Recommendations for Other Services       Functional Status Assessment Patient has had a recent decline in their functional status and demonstrates the ability to make significant improvements in function in a reasonable and predictable amount of time.     Precautions / Restrictions  Precautions Precautions: Fall Recall of Precautions/Restrictions: Intact Restrictions Weight Bearing Restrictions Per Provider Order: No      Mobility  Bed Mobility Overal bed mobility: Needs Assistance Bed Mobility: Supine to Sit     Supine to sit: Contact guard          Transfers Overall transfer level: Needs assistance Equipment used: Rolling Cono Gebhard (2 wheels) Transfers: Sit to/from Stand Sit to Stand: Contact guard assist                Ambulation/Gait Ambulation/Gait assistance: Contact guard assist Gait Distance (Feet): 45 Feet Assistive device: Rolling Parthena Fergeson (2 wheels) Gait Pattern/deviations: Step-through pattern, Decreased stride length Gait velocity: decreased     General Gait Details: patient complaining of feeling weak. CGA for safety. Chair follow for safety  Stairs            Wheelchair Mobility     Tilt Bed    Modified Rankin (Stroke Patients Only)       Balance Overall balance assessment: Needs assistance Sitting-balance support: Feet supported, No upper extremity supported Sitting balance-Leahy Scale: Good     Standing balance support: Bilateral upper extremity supported, During functional activity Standing balance-Leahy Scale: Fair                               Pertinent Vitals/Pain Pain Assessment Pain Assessment: No/denies pain    Home Living Family/patient expects to be discharged to:: Private residence Living Arrangements: Children Available Help at Discharge: Family;Available 24 hours/day (son and daughter in law work from home) Type of Home: House Home Access: Stairs to enter Entrance Stairs-Rails: None Entrance Stairs-Number of Steps: 2 Alternate Level Stairs-Number of Steps: 12;' pt  unsure Home Layout: Two level;Bed/bath  upstairs Home Equipment: Shower seat - built in;BSC/3in1;Cane - quad;Cane - single point;Rolling [place] (2 wheels);Rollator (4 wheels)      Prior Function Prior Level of  Function : Patient poor historian/Family not available;Independent/Modified Independent             Mobility Comments: ambulates with RW vs rollator per patient ADLs Comments: daughter in law provides supervision for showering. Patient reports she was ind with ADLs     Extremity/Trunk Assessment   Upper Extremity Assessment Upper Extremity Assessment: Defer to OT evaluation    Lower Extremity Assessment Lower Extremity Assessment: Generalized weakness    Cervical / Trunk Assessment Cervical / Trunk Assessment: Kyphotic  Communication   Communication Communication: No apparent difficulties    Cognition Arousal: Alert Behavior During Therapy: WFL for tasks assessed/performed, Flat affect   PT - Cognitive impairments: History of cognitive impairments, No family/caregiver present to determine baseline                         Following commands: Intact       Cueing Cueing Techniques: Verbal cues     General Comments      Exercises     Assessment/Plan    PT Assessment Patient needs continued PT services  PT Problem List Decreased activity tolerance;Decreased balance;Decreased mobility;Decreased knowledge of use of DME       PT Treatment Interventions DME instruction;Gait training;Stair training;Patient/family education;Neuromuscular re-education;Functional mobility training;Therapeutic activities;Therapeutic exercise;Balance training;Canalith reposition    PT Goals (Current goals can be found in the Care Plan section)  Acute Rehab PT Goals Patient Stated Goal: to go home PT Goal Formulation: With patient Time For Goal Achievement: 06/29/24 Potential to Achieve Goals: Good    Frequency Min 2X/week     Co-evaluation               AM-PAC PT 6 Clicks Mobility  Outcome Measure Help needed turning from your back to your side while in a flat bed without using bedrails?: A Little Help needed moving from lying on your back to sitting on the  side of a flat bed without using bedrails?: A Little Help needed moving to and from a bed to a chair (including a wheelchair)?: A Little Help needed standing up from a chair using your arms (e.g., wheelchair or bedside chair)?: A Little Help needed to walk in hospital room?: A Little Help needed climbing 3-5 steps with a railing? : A Little 6 Click Score: 18    End of Session   Activity Tolerance: Patient tolerated treatment well Patient left: in chair;with call bell/phone within reach;with chair alarm set Nurse Communication: Mobility status PT Visit Diagnosis: Muscle weakness (generalized) (M62.81);Other abnormalities of gait and mobility (R26.89);Dizziness and giddiness (R42)    Time: 8549-8486 PT Time Calculation (min) (ACUTE ONLY): 23 min   Charges:   PT Evaluation $PT Eval Moderate Complexity: 1 Mod   PT General Charges $$ ACUTE PT VISIT: 1 Visit         Maryanne Finder, PT, DPT Physical Therapist - [facility]  [facility]   Nery Kalisz A Darriel Sinquefield 06/15/2024, 3:44 PM

## 2024-06-15 NOTE — Progress Notes (Signed)
 Pt was able to tolerate all meals today w/o N/V Cindy Huerta 06/15/24 7:20 PM

## 2024-06-15 NOTE — TOC Initial Note (Signed)
 Transition of Care [facility]) - Initial/Assessment Note    Patient Details  Name: ROSHAN SALAMON MRN: [id number] Date of Birth: [birth year]  Transition of Care [facility]) CM/SW Contact:    Corean ONEIDA Haddock, RN Phone Number: 06/15/2024, 12:39 PM  Clinical Narrative:                  Admitted for: intractable nausea and vomiting Admitted from: Home with son Marcey PCP: [facility] Current home health/prior home health/DME: Shower seat, cane, RW and rollator  Marcey confirms that patient is active with [facility].  [place]  with [facility] notified of admission.   Son states post previous admission he connected with [facility] to complete the initial assessment for discuss ALF  MD to order PT eval.  If SNF is recommended, Marcey is in agreement for bed search.  Previous admission insurance denied SNF       Patient Goals and CMS Choice            Expected Discharge Plan and Services                                              Prior Living Arrangements/Services                       Activities of Daily Living   ADL Screening (condition at time of admission) Independently performs ADLs?: No Does the patient have a NEW difficulty with bathing/dressing/toileting/self-feeding that is expected to last >3 days?: Yes (Initiates electronic notice to provider for possible OT consult) Does the patient have a NEW difficulty with getting in/out of bed, walking, or climbing stairs that is expected to last >3 days?: Yes (Initiates electronic notice to provider for possible PT consult) Does the patient have a NEW difficulty with communication that is expected to last >3 days?: Yes (Initiates electronic notice to provider for possible SLP consult) Is the patient deaf or have difficulty hearing?: Yes Does the patient have difficulty seeing, even when wearing glasses/contacts?: Yes Does the patient have difficulty concentrating, remembering, or making decisions?: Yes  Permission  Sought/Granted                  Emotional Assessment              Admission diagnosis:  Cyclical vomiting [R11.15] Intractable nausea and vomiting [R11.2] Acute nonintractable headache, unspecified headache type [R51.9] Patient Active Problem List   Diagnosis Date Noted   Cyclical vomiting with nausea 06/12/2024   Headache 06/12/2024   Cyclical vomiting 06/12/2024   Lactic acidosis 05/25/2024   Hypochloremia 05/25/2024   Dementia (HCC) 05/25/2024   Nausea and vomiting 04/29/2024   Sepsis (HCC) 09/14/2023   Acute kidney injury superimposed on chronic kidney disease (HCC) 09/13/2023   Leukocytosis 09/13/2023   Abnormal TSH 09/13/2023   AKI (acute kidney injury) (HCC) 08/09/2023   Syncope 08/09/2023   Hypomagnesemia 08/09/2023   QT prolongation 08/09/2023   Acute metabolic encephalopathy 08/09/2023   Elevated troponin 08/09/2023   Iron  deficiency anemia 08/17/2022   Elevated lactic acid level 08/17/2022   UTI (urinary tract infection) 08/17/2022   Fall at home, initial encounter 08/17/2022   Abnormal LFTs 08/17/2022   Dehydration 07/27/2022   Malnutrition of moderate degree 06/30/2022   Acute urinary retention 06/29/2022   Acute renal failure superimposed on stage 3b chronic kidney disease (HCC) 06/28/2022  DNR (do not resuscitate) 06/28/2022   Hyponatremia 06/18/2022   Hypokalemia 06/18/2022   Chronic diastolic CHF (congestive heart failure) (HCC) 06/18/2022   Dysuria 06/18/2022   HTN (hypertension) 11/24/2021   Normocytic anemia 11/24/2021   Vertigo 11/24/2021   Breast CA (HCC) 11/22/2021   Breast cancer (HCC) 11/20/2021   Intractable nausea and vomiting 10/09/2021   Weakness 10/07/2021   Carcinoma of overlapping sites of left breast in female, estrogen receptor positive (HCC) 10/06/2021   Dizziness 10/06/2021   Rectal bleeding 10/06/2021   GIB (gastrointestinal bleeding) 10/06/2021   Left-sided weakness 07/29/2021   HLD (hyperlipidemia) 07/29/2021    Hypothyroidism 07/29/2021   Left sided numbness 07/29/2021   Depression with anxiety    CKD (chronic kidney disease), stage IIIa    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   Epigastric pain 04/23/2021   GERD (gastroesophageal reflux disease) 04/23/2021   Benign essential hypertension 01/20/2021   History of breast cancer 01/20/2021   History of TIA (transient ischemic attack) 01/20/2021   Palpitations 01/20/2021   Malignant neoplasm of 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) 12/06/2018   PCP:  Glover [name], MD Pharmacy:   [facility] [phone] - [address] [place] - [address] [place] [address] Phone: [phone] Fax: [phone]  [facility] [address] [place] [address] Phone: [phone] Fax: [phone]     Social Drivers of Health (SDOH) Social History: SDOH Screenings   Food Insecurity: No Food Insecurity (06/13/2024)  Housing: Low Risk  (06/13/2024)  Transportation Needs: No Transportation Needs (06/13/2024)  Utilities: Not At Risk (06/13/2024)  Financial Resource Strain: Patient Declined (12/29/2022)   Received from [facility]  Social Connections: Moderately Isolated (06/13/2024)  Tobacco Use: Low Risk  (06/13/2024)   SDOH Interventions:     Readmission Risk Interventions    05/01/2024   12:59 PM 09/13/2023   10:08 AM 06/20/2022    1:01 PM  Readmission Risk Prevention Plan  Transportation Screening  Complete   PCP or Specialist Appt within 3-5 Days  Complete Complete  HRI or Home Care Consult  Complete   Social Work Consult for Recovery Care Planning/Counseling  Complete Complete  Palliative Care Screening  Not Applicable Not Applicable  Medication Review Oceanographer) Complete Not Complete   Med Review Comments  Patient's son will speak with nurses upon discharge   PCP or Specialist appointment within 3-5 days of discharge Complete    HRI or Home Care Consult Complete    SW  Recovery Care/Counseling Consult Complete    Palliative Care Screening Not Applicable    Skilled Nursing Facility Patient Refused

## 2024-06-15 NOTE — Evaluation (Signed)
 Occupational Therapy Evaluation Patient Details Name: Cindy Huerta MRN: [id number] DOB: 1946-06-03 Today's Date: 06/15/2024   History of Present Illness   78 y/o female admitted 06/12/24 with unexplained nausea and vomiting, also reporting abdominal pain. Multiple recent admissions for similar presentation. PMH includes arthritis, breast cancer, depression, HLD, HTN, and Hypothyroidism; cholecystectomy; abdominal hysterectomy (1982); total mastectomy (Left, 11/20/2021), CKD stage III, history of GI bleed, TIA, and chronic HFpE     Clinical Impressions Pt admitted with above diagnosis. Prior to recent hospitalization, pt lives with family and uses RW vs 4WW for ambulation, is mod independent for ADLs with family providing supervision for showering. Pt presents with questionable cognition, delayed responses and requires increased time for processing. Bed mobility and functional STS transfers performed with CGA + RW, walks approx 45 ft with close chair follow for safety. Performs LB dressing to doff and don underwear CGA, anticipate may need up to MIN A when fatigue increases. Pt would benefit from skilled OT services to address noted impairments and functional limitations (see below for any additional details) in order to maximize safety and independence while minimizing falls risk and caregiver burden. Anticipate the need for follow up [facility] OT services upon acute hospital DC.      If plan is discharge home, recommend the following:   A little help with walking and/or transfers;A little help with bathing/dressing/bathroom;Assistance with cooking/housework;Supervision due to cognitive status     Functional Status Assessment   Patient has had a recent decline in their functional status and/or demonstrates limited ability to make significant improvements in function in a reasonable and predictable amount of time     Equipment Recommendations   None recommended by OT       Precautions/Restrictions   Precautions Precautions: Fall Recall of Precautions/Restrictions: Intact Restrictions Weight Bearing Restrictions Per Provider Order: No     Mobility Bed Mobility Overal bed mobility: Needs Assistance Bed Mobility: Supine to Sit     Supine to sit: Contact guard          Transfers Overall transfer level: Needs assistance Equipment used: Rolling walker (2 wheels) Transfers: Sit to/from Stand Sit to Stand: Contact guard assist           General transfer comment: increased time      Balance Overall balance assessment: Needs assistance Sitting-balance support: Feet supported, No upper extremity supported Sitting balance-Leahy Scale: Good Sitting balance - Comments: sitting EOB   Standing balance support: Bilateral upper extremity supported, During functional activity Standing balance-Leahy Scale: Fair                             ADL either performed or assessed with clinical judgement   ADL Overall ADL's : Needs assistance/impaired Eating/Feeding: Independent;Sitting   Grooming: Standing;Contact guard assist   Upper Body Bathing: Sitting;Contact guard assist       Upper Body Dressing : Sitting;Contact guard assist   Lower Body Dressing: Sit to/from stand;Minimal assistance Lower Body Dressing Details (indicate cue type and reason): doffs and dons underwear Toilet Transfer: Contact guard assist;Rolling walker (2 wheels);Ambulation   Toileting- Clothing Manipulation and Hygiene: Contact guard assist;Sit to/from stand       Functional mobility during ADLs: Contact guard assist;Rolling walker (2 wheels) General ADL Comments: pt requires increased time to complete tasks     Vision Baseline Vision/History: 1 Wears glasses Ability to See in Adequate Light: 0 Adequate Patient Visual Report: No change from baseline Vision Assessment?:  No apparent visual deficits            Pertinent Vitals/Pain Pain  Assessment Pain Assessment: No/denies pain     Extremity/Trunk Assessment Upper Extremity Assessment Upper Extremity Assessment: Generalized weakness   Lower Extremity Assessment Lower Extremity Assessment: Defer to PT evaluation   Cervical / Trunk Assessment Cervical / Trunk Assessment: Kyphotic   Communication Communication Communication: No apparent difficulties   Cognition Arousal: Alert Behavior During Therapy: WFL for tasks assessed/performed, Flat affect Cognition: History of cognitive impairments             OT - Cognition Comments: hx of dementia at baseline, follows simple commands.                 Following commands: Intact       Cueing  General Comments   Cueing Techniques: Verbal cues  no acute s/sx of distress or c/o dizziness. Pt with brief period of delayed responses and reports of feeling weak, questionable cognition throughout           Home Living Family/patient expects to be discharged to:: Private residence Living Arrangements: Children Available Help at Discharge: Family;Available 24 hours/day Type of Home: House Home Access: Stairs to enter [employer] of Steps: 2 Entrance Stairs-Rails: None Home Layout: Two level;Bed/bath upstairs Alternate Level Stairs-Number of Steps: 12;' pt  unsure Alternate Level Stairs-Rails: Left Bathroom Shower/Tub: Producer, television/film/video: Standard     Home Equipment: Shower seat - built in;BSC/3in1;Cane - quad;Cane - single Librarian, academic (2 wheels);Rollator (4 wheels)          Prior Functioning/Environment Prior Level of Function : Patient poor historian/Family not available;Independent/Modified Independent             Mobility Comments: ambulates with RW vs rollator per patient ADLs Comments: daughter in law provides supervision for showering. Patient reports she was ind with ADLs    OT Problem List: Decreased strength;Impaired balance (sitting and/or  standing);Decreased cognition   OT Treatment/Interventions: Self-care/ADL training;Patient/family education;Therapeutic activities;DME and/or AE instruction;Balance training      OT Goals(Current goals can be found in the care plan section)   Acute Rehab OT Goals OT Goal Formulation: With patient Time For Goal Achievement: 06/29/24 Potential to Achieve Goals: Good   OT Frequency:  Min 2X/week    Co-evaluation PT/OT/SLP Co-Evaluation/Treatment: Yes Reason for Co-Treatment: For patient/therapist safety;To address functional/ADL transfers PT goals addressed during session: Mobility/safety with mobility OT goals addressed during session: ADL's and self-care      AM-PAC OT 6 Clicks Daily Activity     Outcome Measure Help from another person eating meals?: None Help from another person taking care of personal grooming?: A Little Help from another person toileting, which includes using toliet, bedpan, or urinal?: A Little Help from another person bathing (including washing, rinsing, drying)?: A Little Help from another person to put on and taking off regular upper body clothing?: A Little Help from another person to put on and taking off regular lower body clothing?: A Little 6 Click Score: 19   End of Session Equipment Utilized During Treatment: Gait belt;Rolling walker (2 wheels) Nurse Communication: Mobility status (IV, need for linen change (NT notified))  Activity Tolerance: Patient tolerated treatment well Patient left: in chair;with chair alarm set;with call bell/phone within reach  OT Visit Diagnosis: Unsteadiness on feet (R26.81);Other abnormalities of gait and mobility (R26.89);Muscle weakness (generalized) (M62.81)                Time: 8549-8487  OT Time Calculation (min): 22 min Charges:  OT General Charges $OT Visit: 1 Visit OT Evaluation $OT Eval Moderate Complexity: 1 Mod  Beni Turrell L. Lorey Pallett, OTR/L  06/15/24, 4:27 PM

## 2024-06-15 NOTE — Plan of Care (Signed)

## 2024-06-16 DIAGNOSIS — R1115 Cyclical vomiting syndrome unrelated to migraine: Secondary | ICD-10-CM | POA: Diagnosis not present

## 2024-06-16 LAB — BASIC METABOLIC PANEL WITH GFR
Anion gap: 8 (ref 5–15)
BUN: <5 mg/dL — ABNORMAL LOW (ref 8–23)
CO2: 22 mmol/L (ref 22–32)
Calcium: 8.9 mg/dL (ref 8.9–10.3)
Chloride: 105 mmol/L (ref 98–111)
Creatinine, Ser: 0.76 mg/dL (ref 0.44–1.00)
GFR, Estimated: >60 mL/min (ref >60)
Glucose, Bld: 88 mg/dL (ref 70–99)
Potassium: 3.4 mmol/L — ABNORMAL LOW (ref 3.5–5.1)
Sodium: 135 mmol/L (ref 135–145)

## 2024-06-16 LAB — CBC
HCT: 32.5 % — ABNORMAL LOW (ref 36.0–46.0)
Hemoglobin: 10.7 g/dL — ABNORMAL LOW (ref 12.0–15.0)
MCH: 31.6 pg (ref 26.0–34.0)
MCHC: 32.9 g/dL (ref 30.0–36.0)
MCV: 95.9 fL (ref 80.0–100.0)
Platelets: 205 K/uL (ref 150–400)
RBC: 3.39 MIL/uL — ABNORMAL LOW (ref 3.87–5.11)
RDW: 13.7 % (ref 11.5–15.5)
WBC: 11.2 K/uL — ABNORMAL HIGH (ref 4.0–10.5)
nRBC: 0 % (ref 0.0–0.2)

## 2024-06-16 MED ORDER — SCOPOLAMINE 1 MG/3DAYS TD PT72: 1.0000 | MEDICATED_PATCH | TRANSDERMAL | 0 refills | Status: DC

## 2024-06-16 MED ORDER — AMLODIPINE BESYLATE 10 MG PO TABS
10.0000 mg | ORAL_TABLET | Freq: Every day | ORAL | Status: DC
  Administered 2024-06-16: 10 mg via ORAL
  Filled 2024-06-16: qty 1

## 2024-06-16 MED ORDER — ONDANSETRON 4 MG PO TBDP: 4.0000 mg | ORAL_TABLET | Freq: Three times a day (TID) | ORAL | 0 refills | Status: DC | PRN

## 2024-06-16 MED ORDER — IRBESARTAN 150 MG PO TABS
300.0000 mg | ORAL_TABLET | Freq: Every day | ORAL | Status: DC
  Administered 2024-06-16: 300 mg via ORAL
  Filled 2024-06-16: qty 2

## 2024-06-16 NOTE — Plan of Care (Signed)

## 2024-06-16 NOTE — Plan of Care (Signed)

## 2024-06-16 NOTE — TOC Transition Note (Signed)
 Transition of Care [facility]) - Discharge Note   Patient Details  Name: Cindy Huerta MRN: [id number] Date of Birth: Aug 16, 1946  [facility]) CM/SW Contact:  Cindy L Malaiyah Achorn, LCSW Phone Number: 06/16/2024, 1:42 PM   Clinical Narrative:     CSW attempted to speak with patient at bedside but patient advised that her son should be contacted to discuss her medical needs.   CSW spoke with Cindy Huerta regarding home health. Cindy advised that [facility] was already in place and had did their assessment prior to his mother's hospitalization.   Cindy Huerta  confirmed that patient is under [facility] care.          Patient Goals and CMS Choice            Discharge Placement                       Discharge Plan and Services Additional resources added to the After Visit Summary for                                       Social Drivers of Health (SDOH) Interventions SDOH Screenings   Food Insecurity: No Food Insecurity (06/13/2024)  Housing: Low Risk  (06/13/2024)  Transportation Needs: No Transportation Needs (06/13/2024)  Utilities: Not At Risk (06/13/2024)  Financial Resource Strain: Patient Declined (12/29/2022)   Received from [facility]  Social Connections: Moderately Isolated (06/13/2024)  Tobacco Use: Low Risk  (06/13/2024)     Readmission Risk Interventions    06/15/2024   12:42 PM 05/01/2024   12:59 PM 09/13/2023   10:08 AM  Readmission Risk Prevention Plan  Transportation Screening Complete  Complete  PCP or Specialist Appt within 3-5 Days   Complete  HRI or Home Care Consult   Complete  Social Work Consult for Recovery Care Planning/Counseling   Complete  Palliative Care Screening   Not Applicable  Medication Review Oceanographer)  Complete Not Complete  Med Review Comments   Patient's son will speak with nurses upon discharge  PCP or Specialist appointment within 3-5 days of discharge Complete Complete   HRI or Home Care  Consult Complete Complete   SW Recovery Care/Counseling Consult  Complete   Palliative Care Screening Not Applicable Not Applicable   Skilled Nursing Facility Complete Patient Refused

## 2024-06-16 NOTE — Discharge Summary (Addendum)
 Physician Discharge Summary  Cindy Huerta FMW:[id number] DOB: 12/09/1945 DOA: 06/12/2024  PCP: Glover [name], MD  Admit date: 06/12/2024 Discharge date: 06/16/2024  Admitted From: home  Disposition:  home w/ home health   Recommendations for Outpatient Follow-up:  Follow up with PCP in 1-2 weeks  Home Health: yes Equipment/Devices:  Discharge Condition: stable  CODE STATUS: DNR Diet recommendation: Heart Healthy   Brief/Interim Summary: HPI was taken from Dr. Cleatus: Cindy Huerta is a 78 y.o. female with medical history significant for dementia, HTN, HLD, CHF, depression, GERD, CAD, hypothyroidism with recurrent admissions for unexplained nausea and vomiting most recently from 6/19 to 05/30/2024, being admitted with 24 hours onset of recurrent symptoms, with similar presentation to prior.  Symptoms include abdominal pain, nausea and vomiting and inability to take any p.o. intake she also had a headache.  She had no fever or chills or change in bowel habits. Of note, last EGD on record from 2022 was normal.  Indication was for the same, intractable nausea and vomiting Upon arrival, afebrile, tachypneic, not tachycardic, SBP in the 150s to 160s and satting close to 100 on room air. Labs with WBC 19,000 and lactic acid 4.5.  Creatinine 1.26 which is about her baseline with bicarb of 18 and anion gap of 20.  Urinalysis without ketones or signs of infection.  Lipase and LFTs unremarkable. CT abdomen and pelvis showing diverticulosis without diverticulitis and otherwise nonacute CT head nonacute Chest x-ray nonacute Patient treated with several antiemetics, IV fluid bolus started on broad-spectrum antibiotics including cefepime , vancomycin  and Flagyl  Admission requested     Discharge Diagnoses:  Principal Problem:   Cyclical vomiting with nausea Active Problems:   Leukocytosis   Lactic acidosis   Hyponatremia   Chronic diastolic CHF (congestive heart failure) (HCC)   Headache    HTN (hypertension)   Dementia (HCC)   Hypothyroidism   Intractable nausea and vomiting   Cyclical vomiting  Intractable nausea & vomiting: etiology unclear. CT abd/plevis shows left colonic diverticulosis & NO active diverticulitis & no acute findings. Zofran  prn. Continue w/ scopolamine  patch. D/c IVFs.   Advance to heart healthy diet. Resolved  Sinus tachycardia: no hx of a.fib/flutter. Continue on home dose of metoprolol . Continue on tele   Lactic acidosis: etiology unclear. Resolved   Leukocytosis: Labile. Completed abx course. Blood cxs NGTD. CXR was neg. CT abd/plevis was neg. UA is shows rare bacteria.  Anion gap metabolic acidosis: resolved   Headache: no c/o headache today so far    Chronic diastolic CHF: continue on home dose of metoprolol . Restart home dose of telmisartan , hydralazine . Monitor I/Os. Repeat echo shows EF 60-65%, grade I diastolic dysfunction, no regional wall motion abnormalities. Troponin neg x 2.    Hyponatremia: resolved    HTN: continue on home dose of metoprolol  & restart ARB, amlodipine , hydralazine     Dementia: continue w/ supportive care    Hypothyroidism: continue on levothyroxine     Hypokalemia: WNL today   Hypomagnesemia: repleated   Discharge Instructions  Discharge Instructions     Diet - low sodium heart healthy   Complete by: As directed    Discharge instructions   Complete by: As directed    F/u w/ PCP in 1-2 weeks.   Increase activity slowly   Complete by: As directed       Allergies as of 06/16/2024       Reactions   Latex Rash   Novocain [procaine] Other (See Comments)   Unsure - told by  DDS not to let anyone give it to her  Confusion   Zestril [lisinopril] Cough   Benadryl  [diphenhydramine ] Other (See Comments)   Jitteriness Agitation   Biaxin [clarithromycin] Other (See Comments)   Confusion   Roxicodone [oxycodone] Nausea And Vomiting, Anxiety        Medication List     TAKE these medications     acetaminophen  500 MG tablet Commonly known as: TYLENOL  Take 500-1,000 mg by mouth daily as needed for moderate pain (pain score 4-6) or mild pain (pain score 1-3).   amLODipine  10 MG tablet Commonly known as: NORVASC  Take 1 tablet (10 mg total) by mouth daily.   Artificial Tears 0.1-0.3 % Soln Generic drug: Dextran 70-Hypromellose Place 1 drop into both eyes daily as needed for dry eyes.   aspirin  EC 81 MG tablet Take 1 tablet (81 mg total) by mouth daily. RESTART 48HRS AFTER DISCHARGE What changed:  when to take this additional instructions   atorvastatin  80 MG tablet Commonly known as: LIPITOR  Take 80 mg by mouth daily.   cloNIDine  0.1 MG tablet Commonly known as: CATAPRES  Take 0.1 mg by mouth daily.   ferrous sulfate  325 (65 FE) MG EC tablet Take 325 mg by mouth at bedtime.   FLUoxetine  10 MG capsule Commonly known as: PROZAC  Take 1 capsule (10 mg total) by mouth daily.   fluticasone  50 MCG/ACT nasal spray Commonly known as: FLONASE  Place 1 spray into both nostrils daily as needed for allergies or rhinitis.   hydrALAZINE  25 MG tablet Commonly known as: APRESOLINE  Take 3 tablets (75 mg total) by mouth 3 (three) times daily.   levothyroxine  50 MCG tablet Commonly known as: SYNTHROID  Take 50 mcg by mouth daily before breakfast.   LORazepam  0.5 MG tablet Commonly known as: ATIVAN  Take 0.5 mg by mouth daily as needed (shaking).   magnesium  oxide 400 MG tablet Commonly known as: MAG-OX Take 1 tablet (400 mg total) by mouth daily. What changed: when to take this   meclizine  12.5 MG tablet Commonly known as: ANTIVERT  Take 1 tablet (12.5 mg total) by mouth 2 (two) times daily as needed for dizziness.   metoprolol  succinate 50 MG 24 hr tablet Commonly known as: TOPROL -XL Take 50 mg by mouth 2 (two) times daily.   ondansetron  4 MG disintegrating tablet Commonly known as: ZOFRAN -ODT Take 1 tablet (4 mg total) by mouth every 8 (eight) hours as needed for up  to 14 days for nausea or vomiting.   pantoprazole  40 MG tablet Commonly known as: PROTONIX  Take 1 tablet (40 mg total) by mouth daily.   scopolamine  1 MG/3DAYS Commonly known as: TRANSDERM-SCOP Place 1 patch (1.5 mg total) onto the skin every 3 (three) days. Start taking on: June 17, 2024   telmisartan  80 MG tablet Commonly known as: MICARDIS  Take 80 mg by mouth daily.   trolamine salicylate 10 % cream Commonly known as: ASPERCREME Apply 1 Application topically as needed for muscle pain.   VITAMIN D -3 PO Take 1 capsule by mouth at bedtime.        Allergies  Allergen Reactions   Latex Rash   Novocain [Procaine] Other (See Comments)    Unsure - told by DDS not to let anyone give it to her  Confusion    Zestril [Lisinopril] Cough   Benadryl  [Diphenhydramine ] Other (See Comments)    Jitteriness Agitation   Biaxin [Clarithromycin] Other (See Comments)    Confusion     Roxicodone [Oxycodone] Nausea And Vomiting and Anxiety  Consultations:    Procedures/Studies: ECHOCARDIOGRAM COMPLETE Result Date: 06/15/2024    ECHOCARDIOGRAM REPORT   Patient Name:   Cindy Huerta Date of Exam: 06/15/2024 Medical Rec #:  [id number]       Height:       66.0 in Accession #:    [id number]      Weight:       156.5 lb Date of Birth:  06-04-1946       BSA:          1.802 m Patient Age:    78 years        BP:           158/64 mmHg Patient Gender: F               HR:           82 bpm. Exam Location:  [facility] Procedure: 2D Echo, Cardiac Doppler and Color Doppler (Both Spectral and Color            Flow Doppler were utilized during procedure). Indications:     CHF  History:         Patient has prior history of Echocardiogram examinations, most                  recent 07/29/2021. TIA; Risk Factors:Hypertension and                  Dyslipidemia.  Sonographer:     Philomena Daring Referring Phys:  [id number] ANTHONY CHRISTELLA POUCH Diagnosing Phys: Redell Cave MD IMPRESSIONS  1. Left ventricular ejection  fraction, by estimation, is 60 to 65%. The left ventricle has normal function. The left ventricle has no regional wall motion abnormalities. There is mild left ventricular hypertrophy. Left ventricular diastolic parameters are consistent with Grade I diastolic dysfunction (impaired relaxation).  2. Right ventricular systolic function is normal. The right ventricular size is normal.  3. The mitral valve is normal in structure. Mild mitral valve regurgitation.  4. The aortic valve is tricuspid. Aortic valve regurgitation is not visualized.  5. The inferior vena cava is normal in size with greater than 50% respiratory variability, suggesting right atrial pressure of 3 mmHg. FINDINGS  Left Ventricle: Left ventricular ejection fraction, by estimation, is 60 to 65%. The left ventricle has normal function. The left ventricle has no regional wall motion abnormalities. The left ventricular internal cavity size was normal in size. There is  mild left ventricular hypertrophy. Left ventricular diastolic parameters are consistent with Grade I diastolic dysfunction (impaired relaxation). Right Ventricle: The right ventricular size is normal. No increase in right ventricular wall thickness. Right ventricular systolic function is normal. Left Atrium: Left atrial size was normal in size. Right Atrium: Right atrial size was normal in size. Pericardium: There is no evidence of pericardial effusion. Mitral Valve: The mitral valve is normal in structure. Mild mitral valve regurgitation. Tricuspid Valve: The tricuspid valve is normal in structure. Tricuspid valve regurgitation is mild. Aortic Valve: The aortic valve is tricuspid. Aortic valve regurgitation is not visualized. Pulmonic Valve: The pulmonic valve was normal in structure. Pulmonic valve regurgitation is trivial. Aorta: The aortic root and ascending aorta are structurally normal, with no evidence of dilitation. Venous: The inferior vena cava is normal in size with greater than  50% respiratory variability, suggesting right atrial pressure of 3 mmHg. IAS/Shunts: No atrial level shunt detected by color flow Doppler.  LEFT VENTRICLE PLAX 2D LVIDd:         3.49  cm     Diastology LVIDs:         2.19 cm     LV e' medial:    7.45 cm/s LV PW:         1.08 cm     LV E/e' medial:  11.3 LV IVS:        1.15 cm     LV e' lateral:   10.50 cm/s LVOT diam:     2.00 cm     LV E/e' lateral: 8.0 LV SV:         62 LV SV Index:   34 LVOT Area:     3.14 cm  LV Volumes (MOD) LV vol d, MOD A2C: 61.1 ml LV vol d, MOD A4C: 65.6 ml LV vol s, MOD A2C: 11.7 ml LV vol s, MOD A4C: 20.6 ml LV SV MOD A2C:     49.4 ml LV SV MOD A4C:     65.6 ml LV SV MOD BP:      47.3 ml IVC IVC diam: 1.45 cm LEFT ATRIUM             Index        RIGHT ATRIUM           Index LA Vol (A2C):   14.3 ml 7.94 ml/m   RA Area:     11.80 cm LA Vol (A4C):   30.8 ml 17.09 ml/m  RA Volume:   26.40 ml  14.65 ml/m LA Biplane Vol: 23.0 ml 12.76 ml/m  AORTIC VALVE LVOT Vmax:   89.40 cm/s LVOT Vmean:  66.000 cm/s LVOT VTI:    0.197 m  AORTA Ao Root diam: 2.30 cm Ao Asc diam:  3.10 cm MITRAL VALVE                TRICUSPID VALVE MV Area (PHT): 4.68 cm     TR Peak grad:   25.2 mmHg MV Decel Time: 162 msec     TR Vmax:        251.00 cm/s MV E velocity: 83.90 cm/s MV A velocity: 113.00 cm/s  SHUNTS MV E/A ratio:  0.74         Systemic VTI:  0.20 m                             Systemic Diam: 2.00 cm Redell Cave MD Electronically signed by Redell Cave MD Signature Date/Time: 06/15/2024/1:22:55 PM    Final    DG Abd Portable 1V Result Date: 06/13/2024 CLINICAL DATA:  Nausea and vomiting. EXAM: PORTABLE ABDOMEN - 1 VIEW COMPARISON:  Abdominal radiograph dated 07/29/2022. FINDINGS: No bowel dilatation or evidence of obstruction. No free air. Calculi. Right upper quadrant cholecystectomy clips. Osteopenia with degenerative changes. No acute osseous pathology. IMPRESSION: Nonobstructive bowel gas pattern. Electronically Signed   By: Vanetta Chou  M.D.   On: 06/13/2024 18:13   CT ABDOMEN PELVIS W CONTRAST Result Date: 06/12/2024 CLINICAL DATA:  Abdominal pain.  Sepsis. EXAM: CT ABDOMEN AND PELVIS WITH CONTRAST TECHNIQUE: Multidetector CT imaging of the abdomen and pelvis was performed using the standard protocol following bolus administration of intravenous contrast. RADIATION DOSE REDUCTION: This exam was performed according to the departmental dose-optimization program which includes automated exposure control, adjustment of the mA and/or kV according to patient size and/or use of iterative reconstruction technique. CONTRAST:  80mL OMNIPAQUE  IOHEXOL  300 MG/ML  SOLN COMPARISON:  05/24/2024 FINDINGS: Lower chest: Prior left mastectomy. No  acute findings. Coronary artery and aortic atherosclerosis. Hepatobiliary: No focal liver abnormality is seen. Status post cholecystectomy. No biliary dilatation. Pancreas: No focal abnormality or ductal dilatation. Spleen: No focal abnormality.  Normal size. Adrenals/Urinary Tract: No adrenal abnormality. No focal renal abnormality. No stones or hydronephrosis. Urinary bladder is unremarkable. Stomach/Bowel: Left colonic diverticulosis. No active diverticulitis. Stomach and small bowel decompressed. No bowel obstruction or inflammatory process. Vascular/Lymphatic: Aortic atherosclerosis. No evidence of aneurysm or adenopathy. Reproductive: Prior hysterectomy.  No adnexal masses. Other: No free fluid or free air. Musculoskeletal: No acute bony abnormality. IMPRESSION: Left colonic diverticulosis.  No active diverticulitis. Aortic atherosclerosis. No acute findings. Electronically Signed   By: Franky Crease M.D.   On: 06/12/2024 19:34   CT Head Wo Contrast Result Date: 06/12/2024 CLINICAL DATA:  Headache EXAM: CT HEAD WITHOUT CONTRAST TECHNIQUE: Contiguous axial images were obtained from the base of the skull through the vertex without intravenous contrast. RADIATION DOSE REDUCTION: This exam was performed according to the  departmental dose-optimization program which includes automated exposure control, adjustment of the mA and/or kV according to patient size and/or use of iterative reconstruction technique. COMPARISON:  04/29/2024 FINDINGS: Brain: Old right caudate head lacunar infarct, stable. There is atrophy and chronic small vessel disease changes. No acute intracranial abnormality. Specifically, no hemorrhage, hydrocephalus, mass lesion, acute infarction, or significant intracranial injury. Vascular: No hyperdense vessel or unexpected calcification. Skull: No acute calvarial abnormality. Sinuses/Orbits: No acute findings Other: None IMPRESSION: Atrophy, chronic microvascular disease. No acute intracranial abnormality. Electronically Signed   By: Franky Crease M.D.   On: 06/12/2024 19:32   DG Chest Port 1 View Result Date: 06/12/2024 CLINICAL DATA:  [id number] Sepsis [facility]) [id number] EXAM: PORTABLE CHEST - 1 VIEW COMPARISON:  May 24, 2024 FINDINGS: Lower lung volumes. No focal airspace consolidation, pleural effusion, or pneumothorax. No cardiomegaly. Tortuous aorta with aortic atherosclerosis. No acute fracture or destructive lesions. Multilevel thoracic osteophytosis. Bilateral AC joint osteoarthritis. IMPRESSION: No acute cardiopulmonary abnormality. Electronically Signed   By: Rogelia Myers M.D.   On: 06/12/2024 17:08   MR BRAIN WO CONTRAST Result Date: 05/28/2024 CLINICAL DATA:  persistent dizziness; rule out cerebellar cva EXAM: MRI HEAD WITHOUT CONTRAST TECHNIQUE: Multiplanar, multiecho pulse sequences of the brain and surrounding structures were obtained without intravenous contrast. COMPARISON:  CT of the head dated Apr 29, 2024. FINDINGS: Brain: Age-related cerebral and cerebellar volume loss and moderately advanced diffuse cerebral white matter disease. No evidence of hemorrhage, mass, acute cortical infarct or hydrocephalus. Vascular: Normal flow voids. Skull and upper cervical spine: Normal marrow signal. No osseous  lesions present. Sinuses/Orbits: Normal. Other: None. IMPRESSION: 1. Moderately advanced diffuse cerebral white matter disease. No apparent acute process. Electronically Signed   By: Evalene Coho M.D.   On: 05/28/2024 15:00   DG Chest Portable 1 View Result Date: 05/24/2024 EXAM: 1 VIEW XRAY OF THE CHEST 05/24/2024 11:45:00 PM COMPARISON: 04/29/2024 CLINICAL HISTORY: Eval for PNA. Patient actively throwing up. FINDINGS: LUNGS AND PLEURA: No focal pulmonary opacity. No pulmonary edema. No pleural effusion. No pneumothorax. HEART AND MEDIASTINUM: No acute abnormality of the cardiac and mediastinal silhouettes. BONES AND SOFT TISSUES: No acute osseous abnormality. IMPRESSION: 1. No acute process. Electronically signed by: Pinkie Pebbles MD 05/24/2024 11:48 PM EDT RP Workstation: [id number]   CT ABDOMEN PELVIS WO CONTRAST Result Date: 05/24/2024 EXAM: CT ABDOMEN AND PELVIS WITHOUT CONTRAST 05/24/2024 11:00:33 PM TECHNIQUE: CT of the abdomen and pelvis was performed without the administration of intravenous contrast. Multiplanar reformatted images are provided  for review. Automated exposure control, iterative reconstruction, and/or weight based adjustment of the mA/kV was utilized to reduce the radiation dose to as low as reasonably achievable. COMPARISON: 04/29/2024 CLINICAL HISTORY: Abdominal pain, acute, nonlocalized. No contrast. Patient arrives [facility] from home for headache and n/v x1 day. Hx of the same and sent to hospital for treatment in the past. Patient also started to complain of chest en route to hospital. History of dementia and at baseline according to family. FINDINGS: LOWER CHEST: Small hiatal hernia. LIVER: The liver is unremarkable. GALLBLADDER AND BILE DUCTS: Status post cholecystectomy. No biliary ductal dilatation. SPLEEN: No acute abnormality. PANCREAS: No acute abnormality. ADRENAL GLANDS: No acute abnormality. KIDNEYS, URETERS AND BLADDER: No stones in the kidneys or ureters. No  hydronephrosis. Mild right perinephric fluid/stranding, chronic. Urinary bladder is unremarkable. GI AND BOWEL: Left colonic diverticulosis, without evidence of diverticulitis. The appendix is not completely visualized, possibly surgically absent. No bowel obstruction. No bowel wall thickening. PERITONEUM AND RETROPERITONEUM: No ascites. No free air. VASCULATURE: Atherosclerotic calcifications of the abdominal aorta and branch vessels. LYMPH NODES: No lymphadenopathy. REPRODUCTIVE ORGANS: Status post hysterectomy. BONES AND SOFT TISSUES: No acute osseous abnormality. No focal soft tissue abnormality. IMPRESSION: 1. No acute findings. 2. Left colonic diverticulosis, without evidence of diverticulitis. Electronically signed by: Pinkie Pebbles MD 05/24/2024 11:05 PM EDT RP Workstation: [id number]   (Echo, Carotid, EGD, Colonoscopy, ERCP)    Subjective: Pt denies any nausea or vomiting.   Discharge Exam: Vitals:   06/16/24 0350 06/16/24 0742  BP: (!) 146/66 (!) 162/73  Pulse: 82 70  Resp: 17 16  Temp: 98.9 F (37.2 C) 98.3 F (36.8 C)  SpO2: 99% 99%   Vitals:   06/15/24 1951 06/15/24 2155 06/16/24 0350 06/16/24 0742  BP: (!) 156/71 (!) 156/81 (!) 146/66 (!) 162/73  Pulse: 85 86 82 70  Resp: 16  17 16   Temp: 97.9 F (36.6 C)  98.9 F (37.2 C) 98.3 F (36.8 C)  TempSrc:      SpO2: 100%  99% 99%  Weight:      Height:        General: Pt is alert, awake, not in acute distress Cardiovascular:  S1/S2 +, no rubs, no gallops Respiratory: CTA bilaterally, no wheezing, no rhonchi Abdominal: Soft, NT, ND, bowel sounds + Extremities: no edema, no cyanosis    The results of significant diagnostics from this hospitalization (including imaging, microbiology, ancillary and laboratory) are listed below for reference.     Microbiology: Recent Results (from the past 240 hours)  Culture, blood (Routine x 2)     Status: None (Preliminary result)   Collection Time: 06/12/24  4:51 PM    Specimen: BLOOD  Result Value Ref Range Status   Specimen Description BLOOD RIGHT ANTECUBITAL  Final   Special Requests   Final    BOTTLES DRAWN AEROBIC ONLY Blood Culture results may not be optimal due to an inadequate volume of blood received in culture bottles   Culture   Final    NO GROWTH 4 DAYS Performed at [facility], [address], [place], [place] [address]    Report Status PENDING  Incomplete  Culture, blood (Routine x 2)     Status: None (Preliminary result)   Collection Time: 06/12/24  6:40 PM   Specimen: BLOOD  Result Value Ref Range Status   Specimen Description BLOOD BLOOD RIGHT WRIST  Final   Special Requests   Final    BOTTLES DRAWN AEROBIC AND ANAEROBIC Blood Culture  results may not be optimal due to an inadequate volume of blood received in culture bottles   Culture   Final    NO GROWTH 4 DAYS Performed at [facility], [address] [place], [place] [address]    Report Status PENDING  Incomplete     Labs: BNP (last 3 results) Recent Labs    08/09/23 0615  BNP 76.7   Basic Metabolic Panel: Recent Labs  Lab 06/12/24 1651 06/13/24 0428 06/14/24 0510 06/15/24 0330 06/16/24 0534  NA 132* 135 136 137 135  K 3.5 3.1* 3.0* 3.7 3.4*  CL 94* 101 101 105 105  CO2 18* 23 23 25 22   GLUCOSE 153* 89 87 99 88  BUN 10 9 8  6* <5*  CREATININE 1.26* 1.01* 0.87 1.01* 0.76  CALCIUM  9.9 8.7* 8.8* 8.4* 8.9  MG  --   --  1.4*  --   --    Liver Function Tests: Recent Labs  Lab 06/12/24 1651 06/13/24 0428  AST 56* 22  ALT 18 12  ALKPHOS 60 37*  BILITOT 1.4* 0.8  PROT 8.7* 6.0*  ALBUMIN 4.2 3.1*   Recent Labs  Lab 06/12/24 1651  LIPASE 22   No results for input(s): AMMONIA in the last 168 hours. CBC: Recent Labs  Lab 06/12/24 1651 06/13/24 0511 06/14/24 0510 06/15/24 0330 06/16/24 0534  WBC 19.0* 13.8* 10.1 11.8* 11.2*  NEUTROABS 16.9*  --   --   --   --   HGB 12.8 10.2* 10.6* 10.1* 10.7*  HCT 37.0 30.4* 30.8* 29.8*  32.5*  MCV 89.4 93.3 90.6 91.7 95.9  PLT 313 213 212 222 205   Cardiac Enzymes: No results for input(s): CKTOTAL, CKMB, CKMBINDEX, TROPONINI in the last 168 hours. BNP: Invalid input(s): POCBNP CBG: Recent Labs  Lab 06/13/24 1327  GLUCAP 117*   D-Dimer No results for input(s): DDIMER in the last 72 hours. Hgb A1c No results for input(s): HGBA1C in the last 72 hours. Lipid Profile No results for input(s): CHOL, HDL, LDLCALC, TRIG, CHOLHDL, LDLDIRECT in the last 72 hours. Thyroid  function studies No results for input(s): TSH, T4TOTAL, T3FREE, THYROIDAB in the last 72 hours.  Invalid input(s): FREET3 Anemia work up No results for input(s): VITAMINB12, FOLATE, FERRITIN, TIBC, IRON , RETICCTPCT in the last 72 hours. Urinalysis    Component Value Date/Time   COLORURINE STRAW (A) 06/13/2024 1132   APPEARANCEUR CLEAR (A) 06/13/2024 1132   LABSPEC 1.016 06/13/2024 1132   PHURINE 6.0 06/13/2024 1132   GLUCOSEU NEGATIVE 06/13/2024 1132   HGBUR NEGATIVE 06/13/2024 1132   BILIRUBINUR NEGATIVE 06/13/2024 1132   KETONESUR NEGATIVE 06/13/2024 1132   PROTEINUR NEGATIVE 06/13/2024 1132   NITRITE NEGATIVE 06/13/2024 1132   LEUKOCYTESUR NEGATIVE 06/13/2024 1132   Sepsis Labs Recent Labs  Lab 06/13/24 0511 06/14/24 0510 06/15/24 0330 06/16/24 0534  WBC 13.8* 10.1 11.8* 11.2*   Microbiology Recent Results (from the past 240 hours)  Culture, blood (Routine x 2)     Status: None (Preliminary result)   Collection Time: 06/12/24  4:51 PM   Specimen: BLOOD  Result Value Ref Range Status   Specimen Description BLOOD RIGHT ANTECUBITAL  Final   Special Requests   Final    BOTTLES DRAWN AEROBIC ONLY Blood Culture results may not be optimal due to an inadequate volume of blood received in culture bottles   Culture   Final    NO GROWTH 4 DAYS Performed at [facility], [address], [place], [place] [address]  Report Status  PENDING  Incomplete  Culture, blood (Routine x 2)     Status: None (Preliminary result)   Collection Time: 06/12/24  6:40 PM   Specimen: BLOOD  Result Value Ref Range Status   Specimen Description BLOOD BLOOD RIGHT WRIST  Final   Special Requests   Final    BOTTLES DRAWN AEROBIC AND ANAEROBIC Blood Culture results may not be optimal due to an inadequate volume of blood received in culture bottles   Culture   Final    NO GROWTH 4 DAYS Performed at [facility], [address] [place], [place] [address]    Report Status PENDING  Incomplete     Time coordinating discharge: 34 minutes  SIGNED:   Anthony CHRISTELLA Pouch, MD  [facility] 06/16/2024, 12:59 PM Pager   If 7PM-7AM, please contact night-coverage [email or web address]

## 2024-06-17 LAB — CULTURE, BLOOD (ROUTINE X 2)
Culture: NO GROWTH
Culture: NO GROWTH

## 2024-06-25 ENCOUNTER — Emergency Department

## 2024-06-25 ENCOUNTER — Inpatient Hospital Stay
Admission: EM | Admit: 2024-06-25 | Discharge: 2024-06-29 | DRG: 682 | Disposition: A | Discharge status: Home Health | Attending: Internal Medicine | Admitting: Internal Medicine

## 2024-06-25 ENCOUNTER — Other Ambulatory Visit: Payer: Self-pay

## 2024-06-25 ENCOUNTER — Encounter: Payer: Self-pay | Admitting: Internal Medicine

## 2024-06-25 DIAGNOSIS — F32A Depression, unspecified: Secondary | ICD-10-CM | POA: Diagnosis present

## 2024-06-25 DIAGNOSIS — E785 Hyperlipidemia, unspecified: Secondary | ICD-10-CM | POA: Diagnosis present

## 2024-06-25 DIAGNOSIS — R5381 Other malaise: Secondary | ICD-10-CM | POA: Diagnosis present

## 2024-06-25 DIAGNOSIS — E861 Hypovolemia: Secondary | ICD-10-CM | POA: Diagnosis present

## 2024-06-25 DIAGNOSIS — Z853 Personal history of malignant neoplasm of breast: Secondary | ICD-10-CM

## 2024-06-25 DIAGNOSIS — Z885 Allergy status to narcotic agent status: Secondary | ICD-10-CM

## 2024-06-25 DIAGNOSIS — Z8249 Family history of ischemic heart disease and other diseases of the circulatory system: Secondary | ICD-10-CM

## 2024-06-25 DIAGNOSIS — R1110 Vomiting, unspecified: Secondary | ICD-10-CM | POA: Diagnosis present

## 2024-06-25 DIAGNOSIS — R7989 Other specified abnormal findings of blood chemistry: Secondary | ICD-10-CM | POA: Diagnosis present

## 2024-06-25 DIAGNOSIS — R1115 Cyclical vomiting syndrome unrelated to migraine: Secondary | ICD-10-CM | POA: Diagnosis present

## 2024-06-25 DIAGNOSIS — I2510 Atherosclerotic heart disease of native coronary artery without angina pectoris: Secondary | ICD-10-CM | POA: Diagnosis present

## 2024-06-25 DIAGNOSIS — I5032 Chronic diastolic (congestive) heart failure: Secondary | ICD-10-CM | POA: Diagnosis present

## 2024-06-25 DIAGNOSIS — Z884 Allergy status to anesthetic agent status: Secondary | ICD-10-CM

## 2024-06-25 DIAGNOSIS — R9431 Abnormal electrocardiogram [ECG] [EKG]: Secondary | ICD-10-CM | POA: Diagnosis present

## 2024-06-25 DIAGNOSIS — Z9049 Acquired absence of other specified parts of digestive tract: Secondary | ICD-10-CM

## 2024-06-25 DIAGNOSIS — E876 Hypokalemia: Principal | ICD-10-CM | POA: Diagnosis present

## 2024-06-25 DIAGNOSIS — Z91040 Latex allergy status: Secondary | ICD-10-CM

## 2024-06-25 DIAGNOSIS — Z66 Do not resuscitate: Secondary | ICD-10-CM | POA: Diagnosis present

## 2024-06-25 DIAGNOSIS — I10 Essential (primary) hypertension: Secondary | ICD-10-CM | POA: Diagnosis present

## 2024-06-25 DIAGNOSIS — R6520 Severe sepsis without septic shock: Principal | ICD-10-CM

## 2024-06-25 DIAGNOSIS — E860 Dehydration: Secondary | ICD-10-CM | POA: Diagnosis present

## 2024-06-25 DIAGNOSIS — F0390 Unspecified dementia without behavioral disturbance: Secondary | ICD-10-CM | POA: Diagnosis present

## 2024-06-25 DIAGNOSIS — E872 Acidosis, unspecified: Secondary | ICD-10-CM | POA: Diagnosis present

## 2024-06-25 DIAGNOSIS — Z79899 Other long term (current) drug therapy: Secondary | ICD-10-CM

## 2024-06-25 DIAGNOSIS — R0682 Tachypnea, not elsewhere classified: Secondary | ICD-10-CM | POA: Diagnosis present

## 2024-06-25 DIAGNOSIS — K219 Gastro-esophageal reflux disease without esophagitis: Secondary | ICD-10-CM | POA: Diagnosis present

## 2024-06-25 DIAGNOSIS — F0394 Unspecified dementia, unspecified severity, with anxiety: Secondary | ICD-10-CM | POA: Diagnosis present

## 2024-06-25 DIAGNOSIS — E039 Hypothyroidism, unspecified: Secondary | ICD-10-CM | POA: Diagnosis present

## 2024-06-25 DIAGNOSIS — F418 Other specified anxiety disorders: Secondary | ICD-10-CM | POA: Diagnosis present

## 2024-06-25 DIAGNOSIS — F0393 Unspecified dementia, unspecified severity, with mood disturbance: Secondary | ICD-10-CM | POA: Diagnosis present

## 2024-06-25 DIAGNOSIS — K449 Diaphragmatic hernia without obstruction or gangrene: Secondary | ICD-10-CM | POA: Diagnosis present

## 2024-06-25 DIAGNOSIS — N179 Acute kidney failure, unspecified: Principal | ICD-10-CM | POA: Diagnosis present

## 2024-06-25 DIAGNOSIS — I110 Hypertensive heart disease with heart failure: Secondary | ICD-10-CM | POA: Diagnosis present

## 2024-06-25 DIAGNOSIS — Z9012 Acquired absence of left breast and nipple: Secondary | ICD-10-CM

## 2024-06-25 DIAGNOSIS — J690 Pneumonitis due to inhalation of food and vomit: Secondary | ICD-10-CM | POA: Diagnosis present

## 2024-06-25 DIAGNOSIS — R112 Nausea with vomiting, unspecified: Secondary | ICD-10-CM | POA: Diagnosis not present

## 2024-06-25 DIAGNOSIS — Z888 Allergy status to other drugs, medicaments and biological substances status: Secondary | ICD-10-CM

## 2024-06-25 DIAGNOSIS — Z881 Allergy status to other antibiotic agents status: Secondary | ICD-10-CM

## 2024-06-25 DIAGNOSIS — Z90710 Acquired absence of both cervix and uterus: Secondary | ICD-10-CM

## 2024-06-25 DIAGNOSIS — Z7982 Long term (current) use of aspirin: Secondary | ICD-10-CM

## 2024-06-25 DIAGNOSIS — Z79890 Hormone replacement therapy (postmenopausal): Secondary | ICD-10-CM

## 2024-06-25 DIAGNOSIS — E871 Hypo-osmolality and hyponatremia: Secondary | ICD-10-CM | POA: Diagnosis present

## 2024-06-25 LAB — LACTIC ACID, PLASMA
Lactic Acid, Venous: 4.4 mmol/L (ref 0.5–1.9)
Lactic Acid, Venous: 4.9 mmol/L (ref 0.5–1.9)
Lactic Acid, Venous: 1.7 mmol/L (ref 0.5–1.9)
Lactic Acid, Venous: 2.3 mmol/L (ref 0.5–1.9)

## 2024-06-25 LAB — URINALYSIS, ROUTINE W REFLEX MICROSCOPIC
Bilirubin Urine: NEGATIVE
Glucose, UA: NEGATIVE mg/dL
Hgb urine dipstick: NEGATIVE
Ketones, ur: NEGATIVE mg/dL
Leukocytes,Ua: NEGATIVE
Nitrite: NEGATIVE
Protein, ur: NEGATIVE mg/dL
Specific Gravity, Urine: 1.006 (ref 1.005–1.030)
pH: 9 — ABNORMAL HIGH (ref 5.0–8.0)

## 2024-06-25 LAB — COMPREHENSIVE METABOLIC PANEL WITH GFR
ALT: 14 U/L (ref 0–44)
AST: 56 U/L — ABNORMAL HIGH (ref 15–41)
Albumin: 4.1 g/dL (ref 3.5–5.0)
Alkaline Phosphatase: 57 U/L (ref 38–126)
Anion gap: 18 — ABNORMAL HIGH (ref 5–15)
BUN: 8 mg/dL (ref 8–23)
CO2: 19 mmol/L — ABNORMAL LOW (ref 22–32)
Calcium: 9.8 mg/dL (ref 8.9–10.3)
Chloride: 89 mmol/L — ABNORMAL LOW (ref 98–111)
Creatinine, Ser: 1.26 mg/dL — ABNORMAL HIGH (ref 0.44–1.00)
GFR, Estimated: 43 mL/min — ABNORMAL LOW (ref >60)
Glucose, Bld: 166 mg/dL — ABNORMAL HIGH (ref 70–99)
Potassium: 4.4 mmol/L (ref 3.5–5.1)
Sodium: 126 mmol/L — ABNORMAL LOW (ref 135–145)
Total Bilirubin: 1 mg/dL (ref 0.0–1.2)
Total Protein: 8 g/dL (ref 6.5–8.1)

## 2024-06-25 LAB — LIPASE, BLOOD: Lipase: 23 U/L (ref 11–51)

## 2024-06-25 LAB — CBC
HCT: 34.6 % — ABNORMAL LOW (ref 36.0–46.0)
Hemoglobin: 12.2 g/dL (ref 12.0–15.0)
MCH: 31.2 pg (ref 26.0–34.0)
MCHC: 35.3 g/dL (ref 30.0–36.0)
MCV: 88.5 fL (ref 80.0–100.0)
Platelets: 297 K/uL (ref 150–400)
RBC: 3.91 MIL/uL (ref 3.87–5.11)
RDW: 13.1 % (ref 11.5–15.5)
WBC: 16.7 K/uL — ABNORMAL HIGH (ref 4.0–10.5)
nRBC: 0 % (ref 0.0–0.2)

## 2024-06-25 LAB — SODIUM: Sodium: 131 mmol/L — ABNORMAL LOW (ref 135–145)

## 2024-06-25 LAB — MRSA NEXT GEN BY PCR, NASAL: MRSA by PCR Next Gen: NOT DETECTED

## 2024-06-25 MED ORDER — ENOXAPARIN SODIUM 30 MG/0.3ML IJ SOSY: 30.0000 mg | PREFILLED_SYRINGE | INTRAMUSCULAR | Status: DC

## 2024-06-25 MED ORDER — HYDROMORPHONE HCL 1 MG/ML IJ SOLN
0.5000 mg | INTRAMUSCULAR | Status: DC | PRN
  Administered 2024-06-25: 0.5 mg via INTRAVENOUS
  Filled 2024-06-25: qty 0.5

## 2024-06-25 MED ORDER — VANCOMYCIN HCL 1500 MG/300ML IV SOLN
1500.0000 mg | Freq: Once | INTRAVENOUS | Status: AC
  Administered 2024-06-25: 1500 mg via INTRAVENOUS
  Filled 2024-06-25 (×2): qty 300

## 2024-06-25 MED ORDER — ENSURE PLUS HIGH PROTEIN PO LIQD
237.0000 mL | Freq: Two times a day (BID) | ORAL | Status: DC
  Administered 2024-06-26 – 2024-06-29 (×5): 237 mL via ORAL

## 2024-06-25 MED ORDER — LACTATED RINGERS IV BOLUS (SEPSIS)
250.0000 mL | Freq: Once | INTRAVENOUS | Status: AC
  Administered 2024-06-25: 250 mL via INTRAVENOUS

## 2024-06-25 MED ORDER — FLUOXETINE HCL 20 MG PO CAPS
20.0000 mg | ORAL_CAPSULE | Freq: Every day | ORAL | Status: DC
  Administered 2024-06-25 – 2024-06-29 (×5): 20 mg via ORAL
  Filled 2024-06-25 (×5): qty 1

## 2024-06-25 MED ORDER — SODIUM CHLORIDE 0.9 % IV SOLN: INTRAVENOUS | Status: AC

## 2024-06-25 MED ORDER — ENOXAPARIN SODIUM 40 MG/0.4ML IJ SOSY
40.0000 mg | PREFILLED_SYRINGE | INTRAMUSCULAR | Status: DC
  Administered 2024-06-25 – 2024-06-28 (×4): 40 mg via SUBCUTANEOUS
  Filled 2024-06-25 (×4): qty 0.4

## 2024-06-25 MED ORDER — ASPIRIN 81 MG PO TBEC
81.0000 mg | DELAYED_RELEASE_TABLET | Freq: Every day | ORAL | Status: DC
  Administered 2024-06-25 – 2024-06-29 (×5): 81 mg via ORAL
  Filled 2024-06-25 (×6): qty 1

## 2024-06-25 MED ORDER — FLUTICASONE PROPIONATE 50 MCG/ACT NA SUSP: 1.0000 | Freq: Every day | NASAL | Status: DC | PRN

## 2024-06-25 MED ORDER — METOCLOPRAMIDE HCL 5 MG/ML IJ SOLN
5.0000 mg | Freq: Four times a day (QID) | INTRAMUSCULAR | Status: DC | PRN
  Administered 2024-06-25 – 2024-06-27 (×3): 5 mg via INTRAVENOUS
  Filled 2024-06-25 (×3): qty 2

## 2024-06-25 MED ORDER — MECLIZINE HCL 25 MG PO TABS: 12.5000 mg | ORAL_TABLET | Freq: Two times a day (BID) | ORAL | Status: DC | PRN

## 2024-06-25 MED ORDER — IOHEXOL 300 MG/ML  SOLN
80.0000 mL | Freq: Once | INTRAMUSCULAR | Status: AC | PRN
  Administered 2024-06-25: 80 mL via INTRAVENOUS

## 2024-06-25 MED ORDER — SCOPOLAMINE 1 MG/3DAYS TD PT72
1.0000 | MEDICATED_PATCH | TRANSDERMAL | Status: DC
  Administered 2024-06-25: 1.5 mg via TRANSDERMAL
  Filled 2024-06-25: qty 1

## 2024-06-25 MED ORDER — HYDRALAZINE HCL 50 MG PO TABS
75.0000 mg | ORAL_TABLET | Freq: Three times a day (TID) | ORAL | Status: DC
  Administered 2024-06-25 – 2024-06-29 (×13): 75 mg via ORAL
  Filled 2024-06-25 (×13): qty 2

## 2024-06-25 MED ORDER — AMOXICILLIN-POT CLAVULANATE 875-125 MG PO TABS
1.0000 | ORAL_TABLET | Freq: Two times a day (BID) | ORAL | Status: DC
  Administered 2024-06-25 – 2024-06-29 (×9): 1 via ORAL
  Filled 2024-06-25 (×9): qty 1

## 2024-06-25 MED ORDER — LACTATED RINGERS IV BOLUS (SEPSIS)
1000.0000 mL | Freq: Once | INTRAVENOUS | Status: AC
  Administered 2024-06-25: 1000 mL via INTRAVENOUS

## 2024-06-25 MED ORDER — PANTOPRAZOLE SODIUM 40 MG PO TBEC
40.0000 mg | DELAYED_RELEASE_TABLET | Freq: Every day | ORAL | Status: DC
  Administered 2024-06-25 – 2024-06-29 (×5): 40 mg via ORAL
  Filled 2024-06-25 (×5): qty 1

## 2024-06-25 MED ORDER — POLYVINYL ALCOHOL 1.4 % OP SOLN: 1.0000 [drp] | Freq: Every day | OPHTHALMIC | Status: DC | PRN

## 2024-06-25 MED ORDER — SODIUM CHLORIDE 0.9 % IV BOLUS
1000.0000 mL | Freq: Once | INTRAVENOUS | Status: AC
  Administered 2024-06-25: 1000 mL via INTRAVENOUS

## 2024-06-25 MED ORDER — AMLODIPINE BESYLATE 10 MG PO TABS
10.0000 mg | ORAL_TABLET | Freq: Every day | ORAL | Status: DC
  Administered 2024-06-25 – 2024-06-29 (×5): 10 mg via ORAL
  Filled 2024-06-25 (×4): qty 1
  Filled 2024-06-25: qty 2

## 2024-06-25 MED ORDER — FENTANYL CITRATE PF 50 MCG/ML IJ SOSY
50.0000 ug | PREFILLED_SYRINGE | Freq: Once | INTRAMUSCULAR | Status: AC
  Administered 2024-06-25: 50 ug via INTRAVENOUS
  Filled 2024-06-25: qty 1

## 2024-06-25 MED ORDER — LORAZEPAM 2 MG/ML IJ SOLN
0.5000 mg | Freq: Once | INTRAMUSCULAR | Status: AC
  Administered 2024-06-25: 0.5 mg via INTRAVENOUS
  Filled 2024-06-25: qty 1

## 2024-06-25 MED ORDER — LORAZEPAM 0.5 MG PO TABS
0.5000 mg | ORAL_TABLET | Freq: Every day | ORAL | Status: DC | PRN
  Administered 2024-06-26: 0.5 mg via ORAL
  Filled 2024-06-25: qty 1

## 2024-06-25 MED ORDER — SODIUM CHLORIDE 0.9 % IV SOLN
2.0000 g | Freq: Once | INTRAVENOUS | Status: AC
  Administered 2024-06-25: 2 g via INTRAVENOUS
  Filled 2024-06-25: qty 12.5

## 2024-06-25 MED ORDER — KETOROLAC TROMETHAMINE 30 MG/ML IJ SOLN
15.0000 mg | Freq: Once | INTRAMUSCULAR | Status: AC
  Administered 2024-06-25: 15 mg via INTRAVENOUS
  Filled 2024-06-25: qty 1

## 2024-06-25 MED ORDER — LEVOTHYROXINE SODIUM 50 MCG PO TABS
50.0000 ug | ORAL_TABLET | Freq: Every day | ORAL | Status: DC
  Administered 2024-06-26 – 2024-06-29 (×4): 50 ug via ORAL
  Filled 2024-06-25 (×4): qty 1

## 2024-06-25 MED ORDER — ATORVASTATIN CALCIUM 20 MG PO TABS
80.0000 mg | ORAL_TABLET | Freq: Every day | ORAL | Status: DC
  Administered 2024-06-25 – 2024-06-29 (×5): 80 mg via ORAL
  Filled 2024-06-25 (×5): qty 4

## 2024-06-25 MED ORDER — CLOPIDOGREL BISULFATE 75 MG PO TABS
75.0000 mg | ORAL_TABLET | Freq: Every day | ORAL | Status: DC
  Administered 2024-06-25 – 2024-06-26 (×2): 75 mg via ORAL
  Filled 2024-06-25 (×3): qty 1

## 2024-06-25 MED ORDER — MECLIZINE HCL 25 MG PO TABS
12.5000 mg | ORAL_TABLET | Freq: Once | ORAL | Status: AC
  Administered 2024-06-25: 12.5 mg via ORAL
  Filled 2024-06-25: qty 1

## 2024-06-25 NOTE — ED Triage Notes (Signed)
 Pt presented to ED with c/o vomiting and dizziness. States has been seen here before for similar symptoms.

## 2024-06-25 NOTE — Progress Notes (Signed)
 ED Pharmacy Antibiotic Sign Off An antibiotic consult was received from an ED provider for vancomycin  per pharmacy dosing for pneumonia. A chart review was completed to assess appropriateness.   The following one time order(s) were placed:  Vancomycin  1500 mg IV x1  Further antibiotic and/or antibiotic pharmacy consults should be ordered by the admitting provider if indicated.   Thank you for involving pharmacy in this patient's care.   Damien Napoleon, PharmD Clinical Pharmacist 06/25/2024 10:41 AM

## 2024-06-25 NOTE — ED Notes (Signed)
 Pt reports she is a L arm restrict, attempted 3x to obtain cultures unsuccessfully, lab called and MD notified of delay

## 2024-06-25 NOTE — Progress Notes (Addendum)
 PHARMACIST - PHYSICIAN COMMUNICATION  CONCERNING:  Enoxaparin  (Lovenox ) for DVT Prophylaxis    RECOMMENDATION: Patient was prescribed enoxaprin 30 mg q24 hours for VTE prophylaxis.   Filed Weights   06/25/24 0439  Weight: 71 kg (156 lb 8.4 oz)    Body mass index is 25.26 kg/m.  Estimated Creatinine Clearance: 33.3 mL/min (A) (by C-G formula based on SCr of 1.26 mg/dL (H)).   Based on [facility] policy patient is candidate for enoxaparin  40 mg every 24 hours based on CrCl > 60ml/min or Weight > 45kg  DESCRIPTION: Pharmacy has adjusted enoxaparin  dose per [facility] policy.  Patient is now receiving enoxaparin  40 mg every 24 hours    Ransom CHRISTELLA Blanch, PharmD Clinical Pharmacist  06/25/2024 12:14 PM

## 2024-06-25 NOTE — ED Notes (Signed)
 Lab has finished cultures, Pt was taken to CT before being able to start ABX, will start once returns to room

## 2024-06-25 NOTE — Consult Note (Signed)
 CODE SEPSIS - PHARMACY COMMUNICATION  **Broad Spectrum Antibiotics should be administered within 1 hour of Sepsis diagnosis**  Time Code Sepsis Called/Page Received: 0722  Antibiotics Ordered: cefepime   Time of 1st antibiotic administration: 0921  Additional action taken by pharmacy: Messaged RN, delay occurred due to difficulty getting cultures drawn, had to get lab to collect.  If necessary, Name of Provider/Nurse Contacted: Meghan Notch, RN    Kayla JULIANNA Blew ,PharmD Clinical Pharmacist  06/25/2024  7:51 AM

## 2024-06-25 NOTE — Sepsis Progress Note (Signed)
Blood cultures drawn before antibiotics hung 

## 2024-06-25 NOTE — Sepsis Progress Note (Signed)
 Elink monitoring for the code sepsis protocol.

## 2024-06-25 NOTE — ED Notes (Signed)
 Two Rns attempted to get an IV on pt. Unable to get access. Charge RN going to try.

## 2024-06-25 NOTE — ED Notes (Signed)
 Assisted with bedpan to void.  Patient complains of severe left side abdominal pain.

## 2024-06-25 NOTE — ED Provider Notes (Signed)
 8:05 AM Assumed care for off going team.   Blood pressure (!) 167/71, pulse 77, temperature 98.4 F (36.9 C), temperature source Oral, resp. rate (!) 22, height 5' 6 (1.676 m), weight 71 kg, SpO2 100%.  See their HPI for full report but in brief pending CT imaging    Lactate elevated at 4.4, white count elevated at 16.  Patient requesting something for pain.  Patient will be given some IV fentanyl .  Patient continued to have nausea and vomiting given history of prolonged QTc I did give her some Ativan   Add on vancomycin  given CT scan concerning for pneumonia given recent admission technically hospital-acquired pneumonia could also be aspiration however.  Patient was reevaluated and updated on CT imaging otherwise been reassuring she expressed understanding but will admit to the hospital team due to rising lactate, dehydration.        Ernest Ronal BRAVO, MD 06/25/24 [phone]

## 2024-06-25 NOTE — Sepsis Progress Note (Signed)
 Notified provider and bedside nurse of need to order and draw repeat lactic acid #3 at 1125.

## 2024-06-25 NOTE — ED Notes (Addendum)
 Pt just returned from CT, will start ABX at this time. Pt has been vomiting non-stop asked for PRN med at this time

## 2024-06-25 NOTE — H&P (Addendum)
 History and Physical    Cindy Huerta FMW:[id number] DOB: 02/18/1946 DOA: 06/25/2024  PCP: Glover [name], MD (Confirm with patient/family/NH records and if not entered, this has to be entered at [facility] point of entry) Patient coming from: Home  I have personally briefly reviewed patient's old medical records in [facility]  Chief Complaint: Abd pain, nausea and vomiting  HPI: Cindy Huerta is a 78 y.o. female with medical history significant of dementia, HTN, HLD, chronic HFpEF, GERD, CAD, hypothyroidism, who has had multiple recent admissions for repeated nauseous vomiting presented with worsening of intractable abdominal pain nauseous vomiting.  Symptoms started 2 days ago, patient started to have sharp epigastric pain, associate with nausea and frequent vomiting, denied any blood or bile in the vomitus no diarrhea.  No fever or chills.  Yesterday, symptoms started to get worse and unable to take any food for repeated nauseous vomiting.  Denies any trouble swallowing solid food, no cough.  ED Course: Afebrile, nontachycardic blood pressure 140/70, O2 saturation 100% on room air.  CT abdomen pelvis with contrast showed no acute findings intra-abdominal, right lower lobe groundglass like changes likely pneumonia, blood work showed sodium 126 bicarb 19 BUN 8 creatinine 1.2 WBC 6.7 hemoglobin 10.2  Patient was given IV bolus 2000 mL plus broad-spectrum antibiotics vancomycin  and cefepime  in ED. Review of Systems: As per HPI otherwise 14 point review of systems negative.    Past Medical History:  Diagnosis Date   Arthritis    Breast cancer (HCC)    Depression    GERD (gastroesophageal reflux disease)    HLD (hyperlipidemia)    Hypertension    Hypothyroidism     Past Surgical History:  Procedure Laterality Date   ABDOMINAL HYSTERECTOMY  1982   AXILLARY SENTINEL NODE BIOPSY Left 11/20/2021   Procedure: AXILLARY SENTINEL NODE BIOPSY;  Surgeon: Tye Millet, DO;  Location: [facility]  ORS;  Service: General;  Laterality: Left;   BREAST BIOPSY Left 09/23/2021   3:00 13cmfn venus marker, path pending   BREAST BIOPSY Left 09/23/2021   3:00 14 cmfn heart marker, path pending   CHOLECYSTECTOMY     ESOPHAGOGASTRODUODENOSCOPY (EGD) WITH PROPOFOL  N/A 10/08/2021   Procedure: ESOPHAGOGASTRODUODENOSCOPY (EGD) WITH PROPOFOL ;  Surgeon: Therisa Bi, MD;  Location: [facility] ENDOSCOPY;  Service: Gastroenterology;  Laterality: N/A;   HEMATOMA EVACUATION Left 11/21/2021   Procedure: EVACUATION HEMATOMA;  Surgeon: Tye Millet, DO;  Location: [facility] ORS;  Service: General;  Laterality: Left;   IR FLUORO GUIDE CV LINE LEFT  08/17/2022   IR US  GUIDE VASC ACCESS LEFT  08/17/2022   IR US  GUIDE VASC ACCESS RIGHT  08/17/2022   left lumpectomy  2019   TOTAL MASTECTOMY Left 11/20/2021   Procedure: TOTAL MASTECTOMY;  Surgeon: Tye Millet, DO;  Location: [facility] ORS;  Service: General;  Laterality: Left;     reports that she has never smoked. She has never used smokeless tobacco. She reports that she does not drink alcohol  and does not use drugs.  Allergies  Allergen Reactions   Latex Rash   Novocain [Procaine] Other (See Comments)    Unsure - told by DDS not to let anyone give it to her  Confusion    Zestril [Lisinopril] Cough   Benadryl  [Diphenhydramine ] Other (See Comments)    Jitteriness Agitation   Biaxin [Clarithromycin] Other (See Comments)    Confusion     Roxicodone [Oxycodone] Nausea And Vomiting and Anxiety    Family History  Problem Relation Age of Onset  Heart disease Mother    Cancer Paternal Grandmother      Prior to Admission medications   Medication Sig Start Date End Date Taking? Authorizing Provider  acetaminophen  (TYLENOL ) 500 MG tablet Take 500-1,000 mg by mouth daily as needed for moderate pain (pain score 4-6) or mild pain (pain score 1-3).   Yes [provider]  amLODipine  (NORVASC ) 10 MG tablet Take 1 tablet (10 mg total) by mouth daily. 08/25/22  Yes  Amin, Ankit C, MD  ARTIFICIAL TEARS 0.1-0.3 % SOLN Place 1 drop into both eyes daily as needed for dry eyes. 07/15/22  Yes [provider]  aspirin  81 MG EC tablet Take 1 tablet (81 mg total) by mouth daily. RESTART 48HRS AFTER DISCHARGE Patient taking differently: Take 81 mg by mouth at bedtime. 11/25/21  Yes Sakai, Isami, DO  atorvastatin  (LIPITOR ) 80 MG tablet Take 80 mg by mouth daily. 09/21/21  Yes [provider]  Cholecalciferol  (VITAMIN D -3 PO) Take 1 capsule by mouth at bedtime.   Yes [provider]  cloNIDine  (CATAPRES ) 0.1 MG tablet Take 0.1 mg by mouth daily.   Yes [provider]  ferrous sulfate  325 (65 FE) MG EC tablet Take 325 mg by mouth at bedtime.   Yes [provider]  FLUoxetine  (PROZAC ) 20 MG capsule Take 20 mg by mouth daily.   Yes [provider]  fluticasone  (FLONASE ) 50 MCG/ACT nasal spray Place 1 spray into both nostrils daily as needed for allergies or rhinitis.   Yes [provider]  hydrALAZINE  (APRESOLINE ) 25 MG tablet Take 3 tablets (75 mg total) by mouth 3 (three) times daily. 05/28/24 06/27/24 Yes [name], MD  levothyroxine  (SYNTHROID ) 50 MCG tablet Take 50 mcg by mouth daily before breakfast. 02/29/24 02/28/25 Yes [provider]  LORazepam  (ATIVAN ) 0.5 MG tablet Take 0.5 mg by mouth daily as needed (shaking).   Yes [provider]  magnesium  oxide (MAG-OX) 400 MG tablet Take 1 tablet (400 mg total) by mouth daily. Patient taking differently: Take 400 mg by mouth at bedtime. 07/30/22  Yes Tobie Yetta HERO, MD  meclizine  (ANTIVERT ) 12.5 MG tablet Take 1 tablet (12.5 mg total) by mouth 2 (two) times daily as needed for dizziness. 05/28/24  Yes [name], MD  metoprolol  succinate (TOPROL -XL) 50 MG 24 hr tablet Take 50 mg by mouth 2 (two) times daily. 03/18/21  Yes [provider]  ondansetron  (ZOFRAN -ODT) 4 MG disintegrating tablet Take 1 tablet (4 mg total) by mouth every 8  (eight) hours as needed for up to 14 days for nausea or vomiting. 06/16/24 06/30/24 Yes Trudy Anthony HERO, MD  pantoprazole  (PROTONIX ) 40 MG tablet Take 1 tablet (40 mg total) by mouth daily. 10/10/21 06/25/24 Yes Sreenath, Sudheer B, MD  scopolamine  (TRANSDERM-SCOP) 1 MG/3DAYS Place 1 patch (1.5 mg total) onto the skin every 3 (three) days. 06/17/24 07/17/24 Yes Trudy Anthony HERO, MD  telmisartan  (MICARDIS ) 80 MG tablet Take 80 mg by mouth daily. 05/06/24  Yes [provider]  trolamine salicylate (ASPERCREME) 10 % cream Apply 1 Application topically as needed for muscle pain.   Yes [provider]    Physical Exam: Vitals:   06/25/24 0630 06/25/24 0700 06/25/24 0933 06/25/24 1030  BP: (!) 167/71 (!) 141/102  (!) 160/72  Pulse: 77 77  84  Resp: (!) 22 (!) 33  19  Temp:   97.9 F (36.6 C)   TempSrc:   Oral   SpO2: 100% 100%  96%  Weight:  Height:        Constitutional: NAD, calm, comfortable Vitals:   06/25/24 0630 06/25/24 0700 06/25/24 0933 06/25/24 1030  BP: (!) 167/71 (!) 141/102  (!) 160/72  Pulse: 77 77  84  Resp: (!) 22 (!) 33  19  Temp:   97.9 F (36.6 C)   TempSrc:   Oral   SpO2: 100% 100%  96%  Weight:      Height:       Eyes: PERRL, lids and conjunctivae normal ENMT: Mucous membranes are dry. Posterior pharynx clear of any exudate or lesions.Normal dentition.  Neck: normal, supple, no masses, no thyromegaly Respiratory: clear to auscultation bilaterally, no wheezing, no crackles. Normal respiratory effort. No accessory muscle use.  Cardiovascular: Regular rate and rhythm, no murmurs / rubs / gallops. No extremity edema. 2+ pedal pulses. No carotid bruits.  Abdomen: no tenderness, no masses palpated. No hepatosplenomegaly. Bowel sounds positive.  Musculoskeletal: no clubbing / cyanosis. No joint deformity upper and lower extremities. Good ROM, no contractures. Normal muscle tone.  Skin: no rashes, lesions, ulcers. No induration Neurologic: CN 2-12  grossly intact. Sensation intact, DTR normal. Strength 5/5 in all 4.  Psychiatric: Normal judgment and insight. Alert and oriented x 3. Normal mood.     Labs on Admission: I have personally reviewed following labs and imaging studies  CBC: Recent Labs  Lab 06/25/24 0504  WBC 16.7*  HGB 12.2  HCT 34.6*  MCV 88.5  PLT 297   Basic Metabolic Panel: Recent Labs  Lab 06/25/24 0504  NA 126*  K 4.4  CL 89*  CO2 19*  GLUCOSE 166*  BUN 8  CREATININE 1.26*  CALCIUM  9.8   GFR: Estimated Creatinine Clearance: 33.3 mL/min (A) (by C-G formula based on SCr of 1.26 mg/dL (H)). Liver Function Tests: Recent Labs  Lab 06/25/24 0504  AST 56*  ALT 14  ALKPHOS 57  BILITOT 1.0  PROT 8.0  ALBUMIN 4.1   Recent Labs  Lab 06/25/24 0504  LIPASE 23   No results for input(s): AMMONIA in the last 168 hours. Coagulation Profile: No results for input(s): INR, PROTIME in the last 168 hours. Cardiac Enzymes: No results for input(s): CKTOTAL, CKMB, CKMBINDEX, TROPONINI in the last 168 hours. BNP (last 3 results) No results for input(s): PROBNP in the last 8760 hours. HbA1C: No results for input(s): HGBA1C in the last 72 hours. CBG: No results for input(s): GLUCAP in the last 168 hours. Lipid Profile: No results for input(s): CHOL, HDL, LDLCALC, TRIG, CHOLHDL, LDLDIRECT in the last 72 hours. Thyroid  Function Tests: No results for input(s): TSH, T4TOTAL, FREET4, T3FREE, THYROIDAB in the last 72 hours. Anemia Panel: No results for input(s): VITAMINB12, FOLATE, FERRITIN, TIBC, IRON , RETICCTPCT in the last 72 hours. Urine analysis:    Component Value Date/Time   COLORURINE STRAW (A) 06/25/2024 0716   APPEARANCEUR CLEAR (A) 06/25/2024 0716   LABSPEC 1.006 06/25/2024 0716   PHURINE 9.0 (H) 06/25/2024 0716   GLUCOSEU NEGATIVE 06/25/2024 0716   HGBUR NEGATIVE 06/25/2024 0716   BILIRUBINUR NEGATIVE 06/25/2024 0716   KETONESUR NEGATIVE  06/25/2024 0716   PROTEINUR NEGATIVE 06/25/2024 0716   NITRITE NEGATIVE 06/25/2024 0716   LEUKOCYTESUR NEGATIVE 06/25/2024 0716    Radiological Exams on Admission: CT CHEST ABDOMEN PELVIS W CONTRAST Result Date: 06/25/2024 CLINICAL DATA:  Vomiting and dizziness.  Left-sided pain.  Sepsis. EXAM: CT CHEST, ABDOMEN, AND PELVIS WITH CONTRAST TECHNIQUE: Multidetector CT imaging of the chest, abdomen and pelvis was performed following the standard  protocol during bolus administration of intravenous contrast. RADIATION DOSE REDUCTION: This exam was performed according to the departmental dose-optimization program which includes automated exposure control, adjustment of the mA and/or kV according to patient size and/or use of iterative reconstruction technique. CONTRAST:  80mL OMNIPAQUE  IOHEXOL  300 MG/ML  SOLN COMPARISON:  CT abdomen and pelvis dated 06/12/2024, CTA chest dated 12/01/2021 FINDINGS: CT CHEST FINDINGS Cardiovascular: Normal heart size. No significant pericardial fluid/thickening. Great vessels are normal in course and caliber. No central pulmonary emboli. Coronary artery calcifications. Mediastinum/Nodes: Imaged thyroid  gland without nodules meeting criteria for imaging follow-up by size. Normal esophagus. No pathologically enlarged axillary, supraclavicular, mediastinal, or hilar lymph nodes. Lungs/Pleura: The central airways are patent. Patchy ground-glass opacity within the medial right lower lobe. No pneumothorax. No pleural effusion. Musculoskeletal: No acute or abnormal lytic or blastic osseous lesions. Multilevel degenerative changes of the thoracic spine. Left mastectomy. CT ABDOMEN PELVIS FINDINGS Hepatobiliary: Unchanged subcentimeter segment 2 hypodensity (2:48), too small to characterize but likely a cyst. No intra or extrahepatic biliary ductal dilation. Cholecystectomy. Pancreas: No focal lesions or main ductal dilation. Spleen: Normal in size without focal abnormality. Adrenals/Urinary  Tract: No adrenal nodules. No suspicious renal mass, calculi, or hydronephrosis. No focal bladder wall thickening. Stomach/Bowel: Normal appearance of the stomach. No evidence of bowel wall thickening, distention, or inflammatory changes. Colonic diverticulosis without acute diverticulitis. Appendix is not discretely seen. Vascular/Lymphatic: Aortic atherosclerosis. No enlarged abdominal or pelvic lymph nodes. Reproductive: No adnexal masses. Other: No free fluid, fluid collection, or free air. Musculoskeletal: No acute or abnormal lytic or blastic osseous findings. Multilevel degenerative changes of the lumbar spine. IMPRESSION: 1. Patchy ground-glass opacity within the medial right lower lobe, likely infectious/inflammatory. 2. No acute abdominopelvic findings. 3. Colonic diverticulosis without acute diverticulitis. 4.  Aortic Atherosclerosis (ICD10-I70.0). Electronically Signed   By: Limin  Xu M.D.   On: 06/25/2024 09:26    EKG: Independently reviewed.  Sinus rhythm, no acute ST changes  Assessment/Plan Principal Problem:   AKI (acute kidney injury) (HCC)  (please populate well all problems here in Problem List. (For example, if patient is on BP meds at home and you resume or decide to hold them, it is a problem that needs to be her. Same for CAD, COPD, HLD and so on)  Intractable nausea with vomiting - Etiology unclear.  CT abdomen pelvis no acute findings.  Revealed patient's GI workup history showed patient had a normal EGD 2 years ago for similar intractable nausea with vomiting.  Patient denied any trouble swallowing at this point. No red flag signs at this point, recommend outpatient GI follow-up for EGD. - Given there is a concurrent QTc prolongation on today's EKG, no change as needed Zofran  to as needed Reglan   AKI with acute anion gap and non-anion gap metabolic acidosis -  volume contracted secondary to repeated nauseous vomiting and GI loss - Continue IV hydration  Hyponatremia -  Hypovolemic, secondary repeated nausea vomiting and GI loss - IV hydration, repeat sodium level this afternoon  Lactic acidosis - Secondary to volume contraction and poor perfusion from GI symptoms - Continue IV hydration - Doubt sepsis, p.o. antibiotics for RLL pneumonia  RLL pneumonia - Likely aspiration secondary to repeated nauseous vomiting - De-escalate antibiotics to Augmentin   Chronic HFpEF - Morning contracted - Continue metoprolol  - Hold off ARB and hydralazine   Dementia - Mentation at baseline  Hypothyroidism - Continue Synthroid   DVT prophylaxis: Lovenox  Code Status: Full code Family Communication: None at bedside Disposition Plan: Expect  less than 2 midnights. Consults called: None Admission status: Telemetry observation   Cort ONEIDA Mana MD [facility] Pager [phone]  06/25/2024, 11:59 AM

## 2024-06-25 NOTE — ED Provider Notes (Signed)
 [facility] Provider Note    Event Date/Time   First MD Initiated Contact with Patient 06/25/24 [phone]     (approximate)   History   Emesis and Dizziness  Level 5 caveat: History limited by dementia and provided primarily by the patient's son who lives with her and who is at bedside.  HPI Cindy Huerta is a 78 y.o. female with history of dementia and frequent episodes of vomiting and dizziness.  She was just seen and discharged from the hospital about a week ago for the same symptoms.  She has had similar symptoms multiple times over the last few months.  She presents tonight for evaluation of acute onset vomiting and feeling dizzy again.  Her sense that it started while she was getting ready for bed and is similar to prior.  The patient currently does not feel good so she is not wanting to talk to anyone.  She is tachypneic because she is upset about feeling ill and getting an IV.  She will follow commands and open her eyes and look at me when I ask her to do so.  Her son said this behavior is normal for her.  She has had no report of pain.  She has no report of weakness and she is moving all of her extremities.     Physical Exam   Triage Vital Signs: ED Triage Vitals  Encounter Vitals Group     BP 06/25/24 0435 126/65     Girls Systolic BP Percentile --      Girls Diastolic BP Percentile --      Boys Systolic BP Percentile --      Boys Diastolic BP Percentile --      Pulse Rate 06/25/24 0435 90     Resp 06/25/24 0435 (!) 32     Temp 06/25/24 0435 98.4 F (36.9 C)     Temp Source 06/25/24 0435 Oral     SpO2 06/25/24 0435 100 %     Weight 06/25/24 0439 71 kg (156 lb 8.4 oz)     Height 06/25/24 0439 1.676 m (5' 6)     Head Circumference --      Peak Flow --      Pain Score 06/25/24 0436 10     Pain Loc --      Pain Education --      Exclude from Growth Chart --     Most recent vital signs: Vitals:   06/25/24 0435 06/25/24 0630  BP: 126/65  (!) 167/71  Pulse: 90 77  Resp: (!) 32 (!) 22  Temp: 98.4 F (36.9 C)   SpO2: 100% 100%    General: Awake, alert, but choosing to not respond but will follow commands. CV:  Good peripheral perfusion.  Regular rate and rhythm. Resp:  Normal effort. no accessory muscle usage nor intercostal retractions.  Lungs are clear to auscultation bilaterally. Abd:  No distention.  No tenderness to palpation of the abdomen. Other:  Pupils are equal and reactive, normal gaze, no nystagmus.   ED Results / Procedures / Treatments   Labs (all labs ordered are listed, but only abnormal results are displayed) Labs Reviewed  COMPREHENSIVE METABOLIC PANEL WITH GFR - Abnormal; Notable for the following components:      Result Value   Sodium 126 (*)    Chloride 89 (*)    CO2 19 (*)    Glucose, Bld 166 (*)    Creatinine, Ser 1.26 (*)  AST 56 (*)    GFR, Estimated 43 (*)    Anion gap 18 (*)    All other components within normal limits  CBC - Abnormal; Notable for the following components:   WBC 16.7 (*)    HCT 34.6 (*)    All other components within normal limits  LACTIC ACID, PLASMA - Abnormal; Notable for the following components:   Lactic Acid, Venous 4.4 (*)    All other components within normal limits  CULTURE, BLOOD (ROUTINE X 2)  CULTURE, BLOOD (ROUTINE X 2)  LIPASE, BLOOD  URINALYSIS, ROUTINE W REFLEX MICROSCOPIC  LACTIC ACID, PLASMA     EKG  ED ECG REPORT I, Darleene Dome, the attending physician, personally viewed and interpreted this ECG.  Date: 06/25/2024 EKG Time: 4:38 AM Rate: 80 Rhythm: Sinus tachycardia QRS Axis: normal Intervals: normal ST/T Wave abnormalities: Non-specific ST segment / T-wave changes, but no clear evidence of acute ischemia.   Narrative Interpretation: no definitive evidence of acute ischemia; does not meet STEMI criteria.There is a great deal of artifact present on leads V3 and V6 and that is confusing the computer making I think that is atrial  fibrillation, but I believe that this is sinus rhythm.    RADIOLOGY See ED course for details   PROCEDURES:  Critical Care performed: Yes, see critical care procedure note(s)  .Critical Care  Performed by: Dome Darleene, MD Authorized by: Dome Darleene, MD   Critical care provider statement:    Critical care time (minutes):  30   Critical care time was exclusive of:  Separately billable procedures and treating other patients   Critical care was necessary to treat or prevent imminent or life-threatening deterioration of the following conditions:  Sepsis   Critical care was time spent personally by me on the following activities:  Development of treatment plan with patient or surrogate, evaluation of patient's response to treatment, examination of patient, obtaining history from patient or surrogate, ordering and performing treatments and interventions, ordering and review of laboratory studies, ordering and review of radiographic studies, pulse oximetry, re-evaluation of patient's condition and review of old charts     IMPRESSION / MDM / ASSESSMENT AND PLAN / ED COURSE  I reviewed the triage vital signs and the nursing notes.                              Differential diagnosis includes, but is not limited to, vertigo, cyclic vomiting, electrolyte or metabolic abnormality, less likely CVA or intracranial bleed or mass.  Patient's presentation is most consistent with acute presentation with potential threat to life or bodily function.  Labs/studies ordered: Urinalysis, lipase, CBC, CMP, EKG  Interventions/Medications given:  Medications  scopolamine  (TRANSDERM-SCOP) 1 MG/3DAYS 1.5 mg (1.5 mg Transdermal Patch Applied 06/25/24 0512)  lactated ringers  bolus 1,000 mL (has no administration in time range)    And  lactated ringers  bolus 250 mL (has no administration in time range)  ceFEPIme  (MAXIPIME ) 2 g in sodium chloride  0.9 % 100 mL IVPB (has no administration in time range)   iohexol  (OMNIPAQUE ) 300 MG/ML solution 80 mL (has no administration in time range)  meclizine  (ANTIVERT ) tablet 12.5 mg (12.5 mg Oral Given 06/25/24 0553)  sodium chloride  0.9 % bolus 1,000 mL (1,000 mLs Intravenous New Bag/Given 06/25/24 0552)  ketorolac  (TORADOL ) 30 MG/ML injection 15 mg (15 mg Intravenous Given 06/25/24 0742)  fentaNYL  (SUBLIMAZE ) injection 50 mcg (50 mcg Intravenous Given 06/25/24 0815)    (  Note:  hospital course my include additional interventions and/or labs/studies not listed above.)   Vital signs are normal although the patient is slightly tachypneic but this seems to be because she is upset.  I reviewed the medical record including the discharge summary written on 06/16/2024 by Dr. Trudy the hospitalist.  The patient had an extensive workup and no specific cause could be identified for her cyclic vomiting and vertigo symptoms.  The patient has had extensive workups recently including head CTs and an MRI of the brain within the last month or so.  Of note, scopolamine  seems to work for her and she was discharged with scopolamine  as well as a prescriptions for meclizine .  The patient's son said she was using the scopolamine  up until 2 or 3 days ago and that she stopped using it.  He said he did not know or understand if he was supposed to keep having her use it or if was only supposed to use it as needed.  I will order a scopolamine  patch as well as order some antiemetics now.  Her EKG both tonight and in the past have shown slight increase in her QTc interval, but I suspect a low-dose of haloperidol  or droperidol  may be successful as both an antivertigo and antiemetic medication.  I will consider the risks and benefits and proceed while awaiting the scopolamine  patch     Clinical Course as of 06/25/24 0836  Mon Jun 25, 2024  9543 Have verified that the patient has a well-documented history of QTc prolongation.  It would be better to avoid haloperidol  or droperidol .   Given her age and dementia I also want to avoid benzodiazepines.  The safest bet is most likely to wait for the scopolamine  and see if her symptoms improve on their own in the meantime. [CF]  0532 Metabolic panel shows a slightly elevated anion gap and slightly elevated creatinine suggestive of acute kidney injury with mild hyponatremia.  Patient has a leukocytosis as well that is up from prior.  Unclear if this is due to the recent vomiting.  I ordered normal saline 1 L bolus for rehydration.  Her CO2 is also 19, unclear if this is clinically significant but I am adding on a lactic acid level and will order an In-N-Out catheterization for urine.  She has no respiratory symptoms. [CF]  0600 Patient is now reporting of left-sided pain.  Given her elevated lactic acid of 4.4 and her leukocytosis of 16.7 and her tachypnea, I am making her code sepsis and I ordered cefepime  2 g IV.  I also ordered additional fluids to meet the 30 mL/kg IV bolus.  Urinalysis is pending.  I ordered a CT chest/abdomen/pelvis for further evaluation to try to explain the patient's pain (ureterolithiasis is possible) and to rule out acute surgical emergency. [CF]  [phone] Transferring ED care to Dr. Ernest to follow up and admit the patient [CF]    Clinical Course User Index [CF] Gordan Huxley, MD     FINAL CLINICAL IMPRESSION(S) / ED DIAGNOSES   Final diagnoses:  Sepsis, due to unspecified organism, unspecified whether acute organ dysfunction present (HCC)  Hyponatremia  Acute kidney injury (HCC)     Rx / DC Orders   ED Discharge Orders     None        Note:  This document was prepared using Dragon voice recognition software and may include unintentional dictation errors.   Gordan Huxley, MD 06/25/24 [phone]

## 2024-06-26 DIAGNOSIS — Z91040 Latex allergy status: Secondary | ICD-10-CM | POA: Diagnosis not present

## 2024-06-26 DIAGNOSIS — F32A Depression, unspecified: Secondary | ICD-10-CM | POA: Diagnosis present

## 2024-06-26 DIAGNOSIS — F0394 Unspecified dementia, unspecified severity, with anxiety: Secondary | ICD-10-CM | POA: Diagnosis present

## 2024-06-26 DIAGNOSIS — J690 Pneumonitis due to inhalation of food and vomit: Secondary | ICD-10-CM | POA: Diagnosis present

## 2024-06-26 DIAGNOSIS — Z66 Do not resuscitate: Secondary | ICD-10-CM | POA: Diagnosis present

## 2024-06-26 DIAGNOSIS — Z9012 Acquired absence of left breast and nipple: Secondary | ICD-10-CM | POA: Diagnosis not present

## 2024-06-26 DIAGNOSIS — E872 Acidosis, unspecified: Secondary | ICD-10-CM | POA: Diagnosis present

## 2024-06-26 DIAGNOSIS — F0393 Unspecified dementia, unspecified severity, with mood disturbance: Secondary | ICD-10-CM | POA: Diagnosis present

## 2024-06-26 DIAGNOSIS — Z853 Personal history of malignant neoplasm of breast: Secondary | ICD-10-CM | POA: Diagnosis not present

## 2024-06-26 DIAGNOSIS — E860 Dehydration: Secondary | ICD-10-CM | POA: Diagnosis present

## 2024-06-26 DIAGNOSIS — E039 Hypothyroidism, unspecified: Secondary | ICD-10-CM | POA: Diagnosis present

## 2024-06-26 DIAGNOSIS — E876 Hypokalemia: Secondary | ICD-10-CM | POA: Diagnosis present

## 2024-06-26 DIAGNOSIS — K449 Diaphragmatic hernia without obstruction or gangrene: Secondary | ICD-10-CM | POA: Diagnosis present

## 2024-06-26 DIAGNOSIS — N179 Acute kidney failure, unspecified: Secondary | ICD-10-CM | POA: Diagnosis present

## 2024-06-26 DIAGNOSIS — E871 Hypo-osmolality and hyponatremia: Secondary | ICD-10-CM | POA: Diagnosis present

## 2024-06-26 DIAGNOSIS — I110 Hypertensive heart disease with heart failure: Secondary | ICD-10-CM | POA: Diagnosis present

## 2024-06-26 DIAGNOSIS — R1115 Cyclical vomiting syndrome unrelated to migraine: Secondary | ICD-10-CM | POA: Diagnosis present

## 2024-06-26 DIAGNOSIS — R112 Nausea with vomiting, unspecified: Secondary | ICD-10-CM | POA: Diagnosis present

## 2024-06-26 DIAGNOSIS — E785 Hyperlipidemia, unspecified: Secondary | ICD-10-CM | POA: Diagnosis present

## 2024-06-26 DIAGNOSIS — I5032 Chronic diastolic (congestive) heart failure: Secondary | ICD-10-CM | POA: Diagnosis present

## 2024-06-26 DIAGNOSIS — K219 Gastro-esophageal reflux disease without esophagitis: Secondary | ICD-10-CM | POA: Diagnosis present

## 2024-06-26 DIAGNOSIS — Z7982 Long term (current) use of aspirin: Secondary | ICD-10-CM | POA: Diagnosis not present

## 2024-06-26 DIAGNOSIS — Z79890 Hormone replacement therapy (postmenopausal): Secondary | ICD-10-CM | POA: Diagnosis not present

## 2024-06-26 DIAGNOSIS — R1110 Vomiting, unspecified: Secondary | ICD-10-CM | POA: Diagnosis present

## 2024-06-26 DIAGNOSIS — I2510 Atherosclerotic heart disease of native coronary artery without angina pectoris: Secondary | ICD-10-CM | POA: Diagnosis present

## 2024-06-26 DIAGNOSIS — Z8249 Family history of ischemic heart disease and other diseases of the circulatory system: Secondary | ICD-10-CM | POA: Diagnosis not present

## 2024-06-26 LAB — BASIC METABOLIC PANEL WITH GFR
Anion gap: 10 (ref 5–15)
BUN: 6 mg/dL — ABNORMAL LOW (ref 8–23)
CO2: 23 mmol/L (ref 22–32)
Calcium: 8.7 mg/dL — ABNORMAL LOW (ref 8.9–10.3)
Chloride: 100 mmol/L (ref 98–111)
Creatinine, Ser: 0.93 mg/dL (ref 0.44–1.00)
GFR, Estimated: >60 mL/min (ref >60)
Glucose, Bld: 87 mg/dL (ref 70–99)
Potassium: 2.9 mmol/L — ABNORMAL LOW (ref 3.5–5.1)
Sodium: 133 mmol/L — ABNORMAL LOW (ref 135–145)

## 2024-06-26 LAB — LACTIC ACID, PLASMA: Lactic Acid, Venous: 1.1 mmol/L (ref 0.5–1.9)

## 2024-06-26 MED ORDER — POTASSIUM CHLORIDE 10 MEQ/100ML IV SOLN
10.0000 meq | INTRAVENOUS | Status: AC
  Administered 2024-06-26 (×2): 10 meq via INTRAVENOUS
  Filled 2024-06-26: qty 100

## 2024-06-26 MED ORDER — POTASSIUM CHLORIDE CRYS ER 20 MEQ PO TBCR
40.0000 meq | EXTENDED_RELEASE_TABLET | Freq: Once | ORAL | Status: AC
  Administered 2024-06-26: 40 meq via ORAL
  Filled 2024-06-26: qty 2

## 2024-06-26 MED ORDER — SODIUM CHLORIDE 0.9 % IV SOLN: INTRAVENOUS | Status: AC

## 2024-06-26 MED ORDER — POTASSIUM CHLORIDE 10 MEQ/100ML IV SOLN
10.0000 meq | INTRAVENOUS | Status: AC
  Administered 2024-06-26 (×4): 10 meq via INTRAVENOUS
  Filled 2024-06-26 (×2): qty 100

## 2024-06-26 MED ORDER — LORAZEPAM 0.5 MG PO TABS
0.5000 mg | ORAL_TABLET | Freq: Four times a day (QID) | ORAL | Status: DC | PRN
  Administered 2024-06-26 – 2024-06-28 (×3): 0.5 mg via ORAL
  Filled 2024-06-26 (×3): qty 1

## 2024-06-26 NOTE — Care Management Obs Status (Signed)
 MEDICARE OBSERVATION STATUS NOTIFICATION   Patient Details  Name: Cindy Huerta MRN: [id number] Date of Birth: [birth year]   Medicare Observation Status Notification Given:  Yes    Emmilia Sowder W, CMA 06/26/2024, 10:28 AM

## 2024-06-26 NOTE — Evaluation (Signed)
 Occupational Therapy Evaluation Patient Details Name: SYONA WROBLEWSKI MRN: [id number] DOB: [birth year] Today's Date: 06/26/2024   History of Present Illness   Pt is an 78 year old female presented with worsening of intractable abdominal pain nausea/ vomiting, admitted with AKI, hyponatremia, lactic acidosis, RLL pneumonia,          PMH significant for dementia, HTN, HLD, chronic HFpEF, GERD, CAD, hypothyroidism, multiple recent admissions for repeated nausea/ vomiting     Clinical Impressions Chart reviewed to date, pt greeted in bed, alert, oriented to self, place, grossly to situation. She is anxious throughout especially during/after mobility. Pt is a ?historian however does provide relevant information the same as what she provided last admission approx 1 week ago. Pt reports she performs ADL grossly with MOD I- has supervision from family for bathing and has someone at home all the time. She reports she amb with a RW in the house during the day, rollator at night for toileting needs. Pt presents with deficits in strength, endurance, activity tolerance, balance, affecting safe and optimal ADL completion. Pt will benefit from acute OT to address deficits and to facilitate optimal ADL performance.  Of note, pt complains of chest pain/flutter after amb to bathroom and in chair. HR to 150 bpm max, recovers to 88 bpm. Spo2 >95% on RA, BP 134/58. Nurse in room to assess/address.      If plan is discharge home, recommend the following:   A little help with walking and/or transfers;A little help with bathing/dressing/bathroom;Assistance with cooking/housework;Supervision due to cognitive status     Functional Status Assessment   Patient has had a recent decline in their functional status and/or demonstrates limited ability to make significant improvements in function in a reasonable and predictable amount of time     Equipment Recommendations   None recommended by OT (pt has recommended  equipment)     Recommendations for Other Services         Precautions/Restrictions   Precautions Precautions: Fall Recall of Precautions/Restrictions: Impaired     Mobility Bed Mobility Overal bed mobility: Needs Assistance Bed Mobility: Supine to Sit     Supine to sit: Supervision, HOB elevated          Transfers Overall transfer level: Needs assistance Equipment used: Rolling walker (2 wheels) Transfers: Sit to/from Stand Sit to Stand: Contact guard assist                  Balance Overall balance assessment: Needs assistance Sitting-balance support: Feet supported, No upper extremity supported Sitting balance-Leahy Scale: Good     Standing balance support: Bilateral upper extremity supported, During functional activity Standing balance-Leahy Scale: Fair                             ADL either performed or assessed with clinical judgement   ADL Overall ADL's : Needs assistance/impaired Eating/Feeding: Set up;Sitting   Grooming: Supervision/safety Grooming Details (indicate cue type and reason): with RW at sink level             Lower Body Dressing: Supervision/safety;Sitting/lateral leans Lower Body Dressing Details (indicate cue type and reason): donn/doff underwear Toilet Transfer: Contact guard assist;Rolling walker (2 wheels);Ambulation Toilet Transfer Details (indicate cue type and reason): intermittent vcs for RW technique Toileting- Clothing Manipulation and Hygiene: Contact guard assist;Sit to/from stand       Functional mobility during ADLs: Contact guard assist;Rolling walker (2 wheels) (approx 15' two attempts)  Vision Patient Visual Report: No change from baseline       Perception         Praxis         Pertinent Vitals/Pain Pain Assessment Pain Assessment: 0-10 Pain Score: 5  Pain Location: chest pain after amb Pain Descriptors / Indicators: Discomfort (palpations) Pain Intervention(s): Limited  activity within patient's tolerance, Repositioned (nurse in room)     Extremity/Trunk Assessment Upper Extremity Assessment Upper Extremity Assessment: Generalized weakness   Lower Extremity Assessment Lower Extremity Assessment: Generalized weakness       Communication Communication Communication: No apparent difficulties   Cognition Arousal: Alert Behavior During Therapy: WFL for tasks assessed/performed, Anxious Cognition: History of cognitive impairments, Cognition impaired   Orientation impairments: Time   Memory impairment (select all impairments): Declarative Frei-term memory, Short-term memory Attention impairment (select first level of impairment): Selective attention Executive functioning impairment (select all impairments): Problem solving, Reasoning OT - Cognition Comments: history of dementia at baseline, no family present to confirm but pt does provide PLOF/historial information consistent with information she provided in previous admission approx 1 week ago                 Following commands: Intact       Cueing  General Comments   Cueing Techniques: Verbal cues  after amb to bathroom, pt reports feeling dizzy/ chest pain; then reports palpations and nurse in room to assess/address. HR 150 bpm after amb to bathroom, recovers to 88 after approx 3 min seated rest break; BP 134/58, spo2 >95 on RA;   Exercises Other Exercises Other Exercises: edu re: role of OT, role of rehab, breathing techniques for calming   Shoulder Instructions      Home Living Family/patient expects to be discharged to:: Private residence Living Arrangements: Children Available Help at Discharge: Family;Available 24 hours/day Type of Home: House Home Access: Stairs to enter [employer] of Steps: 2 Entrance Stairs-Rails: None Home Layout: Two level;Bed/bath upstairs Alternate Level Stairs-Number of Steps: flight Alternate Level Stairs-Rails: Left Bathroom  Shower/Tub: Producer, television/film/video: Standard     Home Equipment: Shower seat - built in;BSC/3in1;Cane - quad;Cane - single point;Rolling Environmental consultant (2 wheels);Rollator (4 wheels)   Additional Comments: pt endorses her home set up is the same, her son works from home      Prior Functioning/Environment Prior Level of Function : Independent/Modified Independent;Patient poor historian/Family not available             Mobility Comments: pt reports she amb with rollator at night, RW during the day ADLs Comments: pt reports she requires MOD I for ADL, supervision for showering from DIL; assist for IADL from son/DIL    OT Problem List: Decreased strength;Impaired balance (sitting and/or standing);Decreased cognition   OT Treatment/Interventions: Self-care/ADL training;Patient/family education;Therapeutic activities;DME and/or AE instruction;Balance training      OT Goals(Current goals can be found in the care plan section)   Acute Rehab OT Goals Patient Stated Goal: feel better OT Goal Formulation: With patient Time For Goal Achievement: 07/10/24 Potential to Achieve Goals: Good ADL Goals Pt Will Perform Grooming: with supervision;sitting;standing Pt Will Perform Lower Body Dressing: with supervision;sit to/from stand;sitting/lateral leans Pt Will Transfer to Toilet: with supervision;ambulating Pt Will Perform Toileting - Clothing Manipulation and hygiene: with supervision;sitting/lateral leans;sit to/from stand   OT Frequency:  Min 2X/week    Co-evaluation              AM-PAC OT 6 Clicks Daily Activity  Outcome Measure Help from another person eating meals?: None Help from another person taking care of personal grooming?: None Help from another person toileting, which includes using toliet, bedpan, or urinal?: A Little Help from another person bathing (including washing, rinsing, drying)?: A Little Help from another person to put on and taking off regular  upper body clothing?: None Help from another person to put on and taking off regular lower body clothing?: A Little 6 Click Score: 21   End of Session Equipment Utilized During Treatment: Rolling walker (2 wheels) Nurse Communication: Mobility status (chest pain, nurse in room)  Activity Tolerance: Patient tolerated treatment well Patient left: in chair;with chair alarm set;with call bell/phone within reach;with nursing/sitter in room  OT Visit Diagnosis: Unsteadiness on feet (R26.81);Other abnormalities of gait and mobility (R26.89);Muscle weakness (generalized) (M62.81)                Time: 8858-8787 OT Time Calculation (min): 31 min Charges:  OT General Charges $OT Visit: 1 Visit OT Evaluation $OT Eval Moderate Complexity: 1 Mod  Therisa Sheffield, OTD OTR/L  06/26/24, 1:43 PM

## 2024-06-26 NOTE — Consult Note (Signed)
 Rogelia Copping, MD [facility]  [address], [address] [place], [place] [address] Phone: [phone] Fax : [phone]  Consultation  Referring Provider:     Dr. Sonjia Primary Care Physician:  Glover [name], MD Primary Gastroenterologist:  Dr. Aundria         Reason for Consultation:     Nausea and vomiting  Date of Admission:  06/25/2024 Date of Consultation:  06/26/2024           HPI:   Cindy Huerta is a 78 y.o. female who has a history of nausea and vomiting and came to the hospital for the same.  The patient has had an upper endoscopy for nausea and vomiting with abdominal pain back in November 2022 and at that time had an EGD by Dr. Therisa showing a normal EGD with a normal esophagus stomach and duodenum.  The patient had a CT scan of the chest and abdomen with pelvis yesterday that showed:  IMPRESSION: 1. Patchy ground-glass opacity within the medial right lower lobe, likely infectious/inflammatory. 2. No acute abdominopelvic findings. 3. Colonic diverticulosis without acute diverticulitis. 4.  Aortic Atherosclerosis  The patient has a history of dementia hypertension hyperlipidemia chronic heart failure with preserved ejection fraction, GERD, coronary artery disease, hypothyroidism and multiple admissions for nausea with vomiting.   The patient was noted to have an increased creatinine of 1.26 on admission with a normal BUN and a slightly elevated AST at 56 which is comparable to her AST 2 weeks ago.  The patient was also noted to have an increased white cell count of 16.7 with the white cell count being 11.210 days ago.  Her hemoglobin was normal at 12.2. The patient reports that she has this nausea and vomiting typically a few times a week but not every day.  She denies anything that makes it better or worse.  She also denies that any specific foods make her worse.  Past Medical History:  Diagnosis Date   Arthritis    Breast cancer (HCC)    Depression    GERD  (gastroesophageal reflux disease)    HLD (hyperlipidemia)    Hypertension    Hypothyroidism     Past Surgical History:  Procedure Laterality Date   ABDOMINAL HYSTERECTOMY  1982   AXILLARY SENTINEL NODE BIOPSY Left 11/20/2021   Procedure: AXILLARY SENTINEL NODE BIOPSY;  Surgeon: Tye Millet, DO;  Location: [facility] ORS;  Service: General;  Laterality: Left;   BREAST BIOPSY Left 09/23/2021   3:00 13cmfn venus marker, path pending   BREAST BIOPSY Left 09/23/2021   3:00 14 cmfn heart marker, path pending   CHOLECYSTECTOMY     ESOPHAGOGASTRODUODENOSCOPY (EGD) WITH PROPOFOL  N/A 10/08/2021   Procedure: ESOPHAGOGASTRODUODENOSCOPY (EGD) WITH PROPOFOL ;  Surgeon: Therisa Bi, MD;  Location: [facility] ENDOSCOPY;  Service: Gastroenterology;  Laterality: N/A;   HEMATOMA EVACUATION Left 11/21/2021   Procedure: EVACUATION HEMATOMA;  Surgeon: Tye Millet, DO;  Location: [facility] ORS;  Service: General;  Laterality: Left;   IR FLUORO GUIDE CV LINE LEFT  08/17/2022   IR US  GUIDE VASC ACCESS LEFT  08/17/2022   IR US  GUIDE VASC ACCESS RIGHT  08/17/2022   left lumpectomy  2019   TOTAL MASTECTOMY Left 11/20/2021   Procedure: TOTAL MASTECTOMY;  Surgeon: Tye Millet, DO;  Location: [facility] ORS;  Service: General;  Laterality: Left;    Prior to Admission medications   Medication Sig Start Date End Date Taking? Authorizing Provider  acetaminophen  (TYLENOL ) 500 MG tablet Take 500-1,000  mg by mouth daily as needed for moderate pain (pain score 4-6) or mild pain (pain score 1-3).   Yes [provider]  amLODipine  (NORVASC ) 10 MG tablet Take 1 tablet (10 mg total) by mouth daily. 08/25/22  Yes Amin, Ankit C, MD  ARTIFICIAL TEARS 0.1-0.3 % SOLN Place 1 drop into both eyes daily as needed for dry eyes. 07/15/22  Yes [provider]  aspirin  81 MG EC tablet Take 1 tablet (81 mg total) by mouth daily. RESTART 48HRS AFTER DISCHARGE Patient taking differently: Take 81 mg by mouth at bedtime. 11/25/21  Yes Sakai, Isami,  DO  atorvastatin  (LIPITOR ) 80 MG tablet Take 80 mg by mouth daily. 09/21/21  Yes [provider]  Cholecalciferol  (VITAMIN D -3 PO) Take 1 capsule by mouth at bedtime.   Yes [provider]  cloNIDine  (CATAPRES ) 0.1 MG tablet Take 0.1 mg by mouth daily.   Yes [provider]  ferrous sulfate  325 (65 FE) MG EC tablet Take 325 mg by mouth at bedtime.   Yes [provider]  FLUoxetine  (PROZAC ) 20 MG capsule Take 20 mg by mouth daily.   Yes [provider]  fluticasone  (FLONASE ) 50 MCG/ACT nasal spray Place 1 spray into both nostrils daily as needed for allergies or rhinitis.   Yes [provider]  hydrALAZINE  (APRESOLINE ) 25 MG tablet Take 3 tablets (75 mg total) by mouth 3 (three) times daily. 05/28/24 06/27/24 Yes [name], MD  levothyroxine  (SYNTHROID ) 50 MCG tablet Take 50 mcg by mouth daily before breakfast. 02/29/24 02/28/25 Yes [provider]  LORazepam  (ATIVAN ) 0.5 MG tablet Take 0.5 mg by mouth daily as needed (shaking).   Yes [provider]  magnesium  oxide (MAG-OX) 400 MG tablet Take 1 tablet (400 mg total) by mouth daily. Patient taking differently: Take 400 mg by mouth at bedtime. 07/30/22  Yes Tobie Yetta HERO, MD  meclizine  (ANTIVERT ) 12.5 MG tablet Take 1 tablet (12.5 mg total) by mouth 2 (two) times daily as needed for dizziness. 05/28/24  Yes [name], MD  metoprolol  succinate (TOPROL -XL) 50 MG 24 hr tablet Take 50 mg by mouth 2 (two) times daily. 03/18/21  Yes [provider]  ondansetron  (ZOFRAN -ODT) 4 MG disintegrating tablet Take 1 tablet (4 mg total) by mouth every 8 (eight) hours as needed for up to 14 days for nausea or vomiting. 06/16/24 06/30/24 Yes Trudy Anthony HERO, MD  pantoprazole  (PROTONIX ) 40 MG tablet Take 1 tablet (40 mg total) by mouth daily. 10/10/21 06/25/24 Yes Sreenath, Sudheer B, MD  scopolamine  (TRANSDERM-SCOP) 1 MG/3DAYS Place 1 patch (1.5 mg total) onto the skin every 3 (three)  days. 06/17/24 07/17/24 Yes Trudy Anthony HERO, MD  telmisartan  (MICARDIS ) 80 MG tablet Take 80 mg by mouth daily. 05/06/24  Yes [provider]  trolamine salicylate (ASPERCREME) 10 % cream Apply 1 Application topically as needed for muscle pain.   Yes [provider]    Family History  Problem Relation Age of Onset   Heart disease Mother    Cancer Paternal Grandmother      Social History   Tobacco Use   Smoking status: Never   Smokeless tobacco: Never  Vaping Use   Vaping status: Never Used  Substance Use Topics   Alcohol  use: Never   Drug use: Never    Allergies as of 06/25/2024 - Review Complete 06/25/2024  Allergen Reaction Noted   Latex Rash 04/25/2019   Novocain [procaine] Other (See Comments) 07/10/2013   Zestril [lisinopril] Cough  12/13/2018   Benadryl  [diphenhydramine ] Other (See Comments) 07/27/2022   Biaxin [clarithromycin] Other (See Comments) 07/10/2013   Roxicodone [oxycodone] Nausea And Vomiting and Anxiety 02/02/2021    Review of Systems:    All systems reviewed and negative except where noted in HPI.   Physical Exam:  Vital signs in last 24 hours: Temp:  [98 F (36.7 C)-98.4 F (36.9 C)] 98.3 F (36.8 C) (07/22 1200) Pulse Rate:  [82-95] 88 (07/22 1330) Resp:  [14-24] 19 (07/22 1200) BP: (129-171)/(53-73) 134/58 (07/22 1330) SpO2:  [95 %-100 %] 98 % (07/22 1330) Last BM Date : 06/24/24 General:   Pleasant, cooperative in NAD Head:  Normocephalic and atraumatic. Eyes:   No icterus.   Conjunctiva pink. PERRLA. Ears:  Normal auditory acuity. Neck:  Supple; no masses or thyroidomegaly Lungs: Respirations even and unlabored. Lungs clear to auscultation bilaterally.   No wheezes, crackles, or rhonchi.  Heart:  Regular rate and rhythm;  Without murmur, clicks, rubs or gallops Abdomen:  Soft, nondistended, nontender. Normal bowel sounds. No appreciable masses or hepatomegaly.  No rebound or guarding.  Rectal:  Not performed. Msk:   Symmetrical without gross deformities.   Extremities:  Without edema, cyanosis or clubbing. Neurologic:  Alert and oriented x3;  grossly normal neurologically. Skin:  Intact without significant lesions or rashes. Cervical Nodes:  No significant cervical adenopathy. Psych:  Alert and cooperative. Normal affect.  LAB RESULTS: Recent Labs    06/25/24 0504  WBC 16.7*  HGB 12.2  HCT 34.6*  PLT 297   BMET Recent Labs    06/25/24 0504 06/25/24 1600 06/26/24 0540  NA 126* 131* 133*  K 4.4  --  2.9*  CL 89*  --  100  CO2 19*  --  23  GLUCOSE 166*  --  87  BUN 8  --  6*  CREATININE 1.26*  --  0.93  CALCIUM  9.8  --  8.7*   LFT Recent Labs    06/25/24 0504  PROT 8.0  ALBUMIN 4.1  AST 56*  ALT 14  ALKPHOS 57  BILITOT 1.0   PT/INR No results for input(s): LABPROT, INR in the last 72 hours.  STUDIES: CT CHEST ABDOMEN PELVIS W CONTRAST Result Date: 06/25/2024 CLINICAL DATA:  Vomiting and dizziness.  Left-sided pain.  Sepsis. EXAM: CT CHEST, ABDOMEN, AND PELVIS WITH CONTRAST TECHNIQUE: Multidetector CT imaging of the chest, abdomen and pelvis was performed following the standard protocol during bolus administration of intravenous contrast. RADIATION DOSE REDUCTION: This exam was performed according to the departmental dose-optimization program which includes automated exposure control, adjustment of the mA and/or kV according to patient size and/or use of iterative reconstruction technique. CONTRAST:  80mL OMNIPAQUE  IOHEXOL  300 MG/ML  SOLN COMPARISON:  CT abdomen and pelvis dated 06/12/2024, CTA chest dated 12/01/2021 FINDINGS: CT CHEST FINDINGS Cardiovascular: Normal heart size. No significant pericardial fluid/thickening. Great vessels are normal in course and caliber. No central pulmonary emboli. Coronary artery calcifications. Mediastinum/Nodes: Imaged thyroid  gland without nodules meeting criteria for imaging follow-up by size. Normal esophagus. No pathologically enlarged  axillary, supraclavicular, mediastinal, or hilar lymph nodes. Lungs/Pleura: The central airways are patent. Patchy ground-glass opacity within the medial right lower lobe. No pneumothorax. No pleural effusion. Musculoskeletal: No acute or abnormal lytic or blastic osseous lesions. Multilevel degenerative changes of the thoracic spine. Left mastectomy. CT ABDOMEN PELVIS FINDINGS Hepatobiliary: Unchanged subcentimeter segment 2 hypodensity (2:48), too small to characterize but likely a cyst. No intra or extrahepatic biliary ductal dilation. Cholecystectomy. Pancreas: No  focal lesions or main ductal dilation. Spleen: Normal in size without focal abnormality. Adrenals/Urinary Tract: No adrenal nodules. No suspicious renal mass, calculi, or hydronephrosis. No focal bladder wall thickening. Stomach/Bowel: Normal appearance of the stomach. No evidence of bowel wall thickening, distention, or inflammatory changes. Colonic diverticulosis without acute diverticulitis. Appendix is not discretely seen. Vascular/Lymphatic: Aortic atherosclerosis. No enlarged abdominal or pelvic lymph nodes. Reproductive: No adnexal masses. Other: No free fluid, fluid collection, or free air. Musculoskeletal: No acute or abnormal lytic or blastic osseous findings. Multilevel degenerative changes of the lumbar spine. IMPRESSION: 1. Patchy ground-glass opacity within the medial right lower lobe, likely infectious/inflammatory. 2. No acute abdominopelvic findings. 3. Colonic diverticulosis without acute diverticulitis. 4.  Aortic Atherosclerosis (ICD10-I70.0). Electronically Signed   By: Limin  Xu M.D.   On: 06/25/2024 09:26      Impression / Plan:   Assessment: Principal Problem:   AKI (acute kidney injury) (HCC) Active Problems:   Intractable vomiting   Cindy Huerta is a 78 y.o. y/o female with chronic nausea vomiting with a workup in the past showing a normal EGD.  The patient may have the nausea vomiting intermittently from other  sources other than GI.  I have discussed the plan to proceed with a upper endoscopy with the patient's son who feels that if the patient wants it done to rule out peptic ulcer disease as the cause of this patient's symptoms then he is for it.  Plan:  The patient will be set up for a EGD for tomorrow.  The patient will be kept n.p.o. after midnight.  The patient and her son have been explained the plan and agree with it.  Thank you for involving me in the care of this patient.      LOS: 0 days   Rogelia Copping, MD, MD. NOLIA 06/26/2024, 2:41 PM,  Pager [phone] 7am-5pm  Check AMION for 5pm -7am coverage and on weekends   Note: This dictation was prepared with Dragon dictation along with smaller phrase technology. Any transcriptional errors that result from this process are unintentional.

## 2024-06-26 NOTE — Plan of Care (Signed)
  Problem: Education: Goal: Knowledge of General Education information will improve Description: Including pain rating scale, medication(s)/side effects and non-pharmacologic comfort measures Outcome: Progressing   Problem: Clinical Measurements: Goal: Will remain free from infection Outcome: Progressing   Problem: Activity: Goal: Risk for activity intolerance will decrease Outcome: Progressing   Problem: Elimination: Goal: Will not experience complications related to urinary retention Outcome: Progressing   Problem: Pain Managment: Goal: General experience of comfort will improve and/or be controlled Outcome: Progressing   Problem: Skin Integrity: Goal: Risk for impaired skin integrity will decrease Outcome: Progressing   Problem: Coping: Goal: Level of anxiety will decrease Outcome: Progressing

## 2024-06-26 NOTE — Progress Notes (Signed)
 PROGRESS NOTE  CHANIYAH JAHR FMW:[id number] DOB: 1946-11-19 DOA: 06/25/2024 PCP: Glover [name], MD   LOS: 0 days   Brief narrative:   Cindy Huerta is a 78 y.o. female with medical history significant of dementia, hypertension, hyperlipidemia, chronic HFpEF, GERD, CAD, hypothyroidism, with multiple recent admissions for repeated nausea, vomiting presented with worsening of intractable abdominal pain nauseous vomiting for 2 days.  In the ED patient had stable vitals.  CT abdomen pelvis with contrast showed no acute findings intra-abdominal, right lower lobe groundglass like changes likely pneumonia,.  BMP showed hyponatremia with sodium 126.   creatinine 1.2 WBC 6.7 hemoglobin 10.2.  Patient was then placed in observation.   Assessment/Plan: Principal Problem:   AKI (acute kidney injury) (HCC)  Intractable nausea with vomiting  CT abdomen pelvis no acute findings.  EGD done 2 years back was normal.  On as needed Reglan  due to QTc prolongation.  Patient states that she does have acute burst of vomiting and has had frequent admissions in the past and wishes to have GI consultation for possible need for EGD.  Denies history of diabetes.  Will reach out to GI for this.  Will keep the patient on clears for now.  AKI with acute anion gap and non-anion gap metabolic acidosis Creatinine of 1.2 on presentation.  Baseline creatinine of 0.7.  At this time it has improved to 0.9.  Check BMP in AM.   Hyponatremia Likely secondary to hypovolemia.  Sodium level today at 133.  Continue hydration.  Hypokalemia.  Potassium of 2.9 today.  Will replace with IV and oral potassium.  Continue to monitor closely.  Check BMP in AM.  Lactic acidosis Secondary to volume depletion.  Lactate improved from 4.9 to 1.1 after hydration.   RLL pneumonia Likely aspiration secondary to repeated vomiting.  Currently on Augmentin .  No fever.  Will closely monitor for worsening respiratory status.   Chronic  HFpEF Receiving IV fluids due to volume depletion.  Continue metoprolol .  Hold off ARB and hydralazine    Dementia At baseline.  Anxiety.  Continue Ativan  as needed.   Hypothyroidism - Continue Synthroid    Debility weakness.  Will get PT OT evaluation.  DVT prophylaxis: enoxaparin  (LOVENOX ) injection 40 mg Start: 06/25/24 1800   Disposition: Home likely in 1 to 2 days  Status is: Observation The patient will require care spanning > 2 midnights and should be moved to inpatient because: Pending clinical improvement, possible GI workup, significant hypokalemia,    Code Status:     Code Status: Limited: Do not attempt resuscitation (DNR) -DNR-LIMITED -Do Not Intubate/DNI   Family Communication: None at bedside  Consultants: GI.  Procedures: None  Anti-infectives:  None  Anti-infectives (From admission, onward)    Start     Dose/Rate Route Frequency Ordered Stop   06/25/24 1145  vancomycin  (VANCOREADY) IVPB 1500 mg/300 mL        1,500 mg 150 mL/hr over 120 Minutes Intravenous  Once 06/25/24 1040 06/25/24 1600   06/25/24 1145  amoxicillin -clavulanate (AUGMENTIN ) 875-125 MG per tablet 1 tablet        1 tablet Oral Every 12 hours 06/25/24 1141     06/25/24 0730  ceFEPIme  (MAXIPIME ) 2 g in sodium chloride  0.9 % 100 mL IVPB        2 g 200 mL/hr over 30 Minutes Intravenous  Once 06/25/24 0722 06/25/24 1021      Subjective: Today, patient was seen and examined at bedside.  Complains of episode of vomiting  and nausea this morning on clears at this time.  Feels frustrated about her episodes of vomiting especially happens 30 minutes after eating intermittently and having recurrent admission to the hospital..  Inquiring about the GI workup and endoscopy.  Objective: Vitals:   06/26/24 1200 06/26/24 1330  BP: (!) 134/54 (!) 134/58  Pulse: 94 88  Resp: 19   Temp: 98.3 F (36.8 C)   SpO2: 100% 98%    Intake/Output Summary (Last 24 hours) at 06/26/2024 1404 Last data filed  at 06/26/2024 0900 Gross per 24 hour  Intake 2733.71 ml  Output 150 ml  Net 2583.71 ml   Filed Weights   06/25/24 0439  Weight: 71 kg   Body mass index is 25.26 kg/m.   Physical Exam:  GENERAL: Patient is alert awake and oriented. Not in obvious distress.  Elderly female, Communicative. HENT: No scleral pallor or icterus. Pupils equally reactive to light. Oral mucosa is slightly dry NECK: is supple, no gross swelling noted. CHEST:  Diminished breath sounds bilaterally. CVS: S1 and S2 heard, no murmur. Regular rate and rhythm.  ABDOMEN: Soft, non-tender, bowel sounds are present. EXTREMITIES: No edema. CNS: Cranial nerves are intact. No focal motor deficits. SKIN: warm and dry without rashes.  Data Review: I have personally reviewed the following laboratory data and studies,  CBC: Recent Labs  Lab 06/25/24 0504  WBC 16.7*  HGB 12.2  HCT 34.6*  MCV 88.5  PLT 297   Basic Metabolic Panel: Recent Labs  Lab 06/25/24 0504 06/25/24 1600 06/26/24 0540  NA 126* 131* 133*  K 4.4  --  2.9*  CL 89*  --  100  CO2 19*  --  23  GLUCOSE 166*  --  87  BUN 8  --  6*  CREATININE 1.26*  --  0.93  CALCIUM  9.8  --  8.7*   Liver Function Tests: Recent Labs  Lab 06/25/24 0504  AST 56*  ALT 14  ALKPHOS 57  BILITOT 1.0  PROT 8.0  ALBUMIN 4.1   Recent Labs  Lab 06/25/24 0504  LIPASE 23   No results for input(s): AMMONIA in the last 168 hours. Cardiac Enzymes: No results for input(s): CKTOTAL, CKMB, CKMBINDEX, TROPONINI in the last 168 hours. BNP (last 3 results) Recent Labs    08/09/23 0615  BNP 76.7    ProBNP (last 3 results) No results for input(s): PROBNP in the last 8760 hours.  CBG: No results for input(s): GLUCAP in the last 168 hours. Recent Results (from the past 240 hours)  MRSA Next Gen by PCR, Nasal     Status: None   Collection Time: 06/25/24 12:50 PM   Specimen: Nasal Mucosa; Nasal Swab  Result Value Ref Range Status   MRSA by PCR  Next Gen NOT DETECTED NOT DETECTED Final    Comment: (NOTE) The GeneXpert MRSA Assay (FDA approved for NASAL specimens only), is one component of a comprehensive MRSA colonization surveillance program. It is not intended to diagnose MRSA infection nor to guide or monitor treatment for MRSA infections. Test performance is not FDA approved in patients less than 30 years old. Performed at [facility], [address], [place], [place] [address]      Studies: CT CHEST ABDOMEN PELVIS W CONTRAST Result Date: 06/25/2024 CLINICAL DATA:  Vomiting and dizziness.  Left-sided pain.  Sepsis. EXAM: CT CHEST, ABDOMEN, AND PELVIS WITH CONTRAST TECHNIQUE: Multidetector CT imaging of the chest, abdomen and pelvis was performed following the standard protocol during bolus  administration of intravenous contrast. RADIATION DOSE REDUCTION: This exam was performed according to the departmental dose-optimization program which includes automated exposure control, adjustment of the mA and/or kV according to patient size and/or use of iterative reconstruction technique. CONTRAST:  80mL OMNIPAQUE  IOHEXOL  300 MG/ML  SOLN COMPARISON:  CT abdomen and pelvis dated 06/12/2024, CTA chest dated 12/01/2021 FINDINGS: CT CHEST FINDINGS Cardiovascular: Normal heart size. No significant pericardial fluid/thickening. Great vessels are normal in course and caliber. No central pulmonary emboli. Coronary artery calcifications. Mediastinum/Nodes: Imaged thyroid  gland without nodules meeting criteria for imaging follow-up by size. Normal esophagus. No pathologically enlarged axillary, supraclavicular, mediastinal, or hilar lymph nodes. Lungs/Pleura: The central airways are patent. Patchy ground-glass opacity within the medial right lower lobe. No pneumothorax. No pleural effusion. Musculoskeletal: No acute or abnormal lytic or blastic osseous lesions. Multilevel degenerative changes of the thoracic spine. Left mastectomy. CT ABDOMEN  PELVIS FINDINGS Hepatobiliary: Unchanged subcentimeter segment 2 hypodensity (2:48), too small to characterize but likely a cyst. No intra or extrahepatic biliary ductal dilation. Cholecystectomy. Pancreas: No focal lesions or main ductal dilation. Spleen: Normal in size without focal abnormality. Adrenals/Urinary Tract: No adrenal nodules. No suspicious renal mass, calculi, or hydronephrosis. No focal bladder wall thickening. Stomach/Bowel: Normal appearance of the stomach. No evidence of bowel wall thickening, distention, or inflammatory changes. Colonic diverticulosis without acute diverticulitis. Appendix is not discretely seen. Vascular/Lymphatic: Aortic atherosclerosis. No enlarged abdominal or pelvic lymph nodes. Reproductive: No adnexal masses. Other: No free fluid, fluid collection, or free air. Musculoskeletal: No acute or abnormal lytic or blastic osseous findings. Multilevel degenerative changes of the lumbar spine. IMPRESSION: 1. Patchy ground-glass opacity within the medial right lower lobe, likely infectious/inflammatory. 2. No acute abdominopelvic findings. 3. Colonic diverticulosis without acute diverticulitis. 4.  Aortic Atherosclerosis (ICD10-I70.0). Electronically Signed   By: Limin  Xu M.D.   On: 06/25/2024 09:26      Vernal Alstrom, MD  [facility] 06/26/2024  If 7PM-7AM, please contact night-coverage

## 2024-06-26 NOTE — Evaluation (Signed)
 Physical Therapy Evaluation Patient Details Name: Cindy Huerta MRN: [id number] DOB: Mar 25, 1946 Today's Date: 06/26/2024  History of Present Illness  Pt is an 78 year old female presented with worsening of intractable abdominal pain nausea/ vomiting, admitted with AKI, hyponatremia, lactic acidosis, RLL pneumonia,          PMH significant for dementia, HTN, HLD, chronic HFpEF, GERD, CAD, hypothyroidism, multiple recent admissions for repeated nausea/ vomiting  Clinical Impression  Patient admitted with the above. PTA, patient lives with son and daughter in law and uses RW in the house and rollator at night. Patient presents with weakness, impaired balance, and decreased activity tolerance. In recliner on arrival. Stood from recliner with CGA. Ambulated 76' with RW and CGA. HR up to 145 bpm. Experiencing SOB but seems to be more from being anxious. Patient will benefit from skilled PT services during acute stay to address listed deficits. Patient will benefit from ongoing therapy at discharge to maximize functional independence and safety.        If plan is discharge home, recommend the following: A little help with walking and/or transfers;A little help with bathing/dressing/bathroom;Help with stairs or ramp for entrance;Assist for transportation;Assistance with cooking/housework;Supervision due to cognitive status   Can travel by private vehicle   Yes    Equipment Recommendations None recommended by PT  Recommendations for Other Services       Functional Status Assessment Patient has had a recent decline in their functional status and demonstrates the ability to make significant improvements in function in a reasonable and predictable amount of time.     Precautions / Restrictions Precautions Precautions: Fall Recall of Precautions/Restrictions: Impaired Restrictions Weight Bearing Restrictions Per Provider Order: No      Mobility  Bed Mobility               General bed  mobility comments: seated in recliner on arrival    Transfers Overall transfer level: Needs assistance Equipment used: Rolling Nia Nathaniel (2 wheels) Transfers: Sit to/from Stand Sit to Stand: Contact guard assist                Ambulation/Gait Ambulation/Gait assistance: Contact guard assist Gait Distance (Feet): 40 Feet Assistive device: Rolling Charday Capetillo (2 wheels) Gait Pattern/deviations: Step-through pattern, Decreased stride length Gait velocity: decreased     General Gait Details: CGA for safety. No overt LOB. HR up to 145  Stairs            Wheelchair Mobility     Tilt Bed    Modified Rankin (Stroke Patients Only)       Balance Overall balance assessment: Needs assistance Sitting-balance support: Feet supported, No upper extremity supported Sitting balance-Leahy Scale: Good     Standing balance support: Bilateral upper extremity supported, During functional activity Standing balance-Leahy Scale: Fair                               Pertinent Vitals/Pain Pain Assessment Pain Assessment: No/denies pain    Home Living Family/patient expects to be discharged to:: Private residence Living Arrangements: Children Available Help at Discharge: Family;Available 24 hours/day Type of Home: House Home Access: Stairs to enter Entrance Stairs-Rails: None Entrance Stairs-Number of Steps: 2 Alternate Level Stairs-Number of Steps: flight Home Layout: Two level;Bed/bath upstairs Home Equipment: Shower seat - built in;BSC/3in1;Cane - quad;Cane - single point;Rolling Caeleb Batalla (2 wheels);Rollator (4 wheels) Additional Comments: pt endorses her home set up is the same, her son works  from home    Prior Function Prior Level of Function : Independent/Modified Independent;Patient poor historian/Family not available             Mobility Comments: pt reports she amb with rollator at night, RW during the day ADLs Comments: pt reports she requires MOD I for  ADL, supervision for showering from DIL; assist for IADL from son/DIL     Extremity/Trunk Assessment   Upper Extremity Assessment Upper Extremity Assessment: Generalized weakness    Lower Extremity Assessment Lower Extremity Assessment: Generalized weakness    Cervical / Trunk Assessment Cervical / Trunk Assessment: Kyphotic  Communication   Communication Communication: No apparent difficulties    Cognition Arousal: Alert Behavior During Therapy: WFL for tasks assessed/performed, Anxious   PT - Cognitive impairments: History of cognitive impairments, No family/caregiver present to determine baseline                         Following commands: Intact       Cueing Cueing Techniques: Verbal cues     General Comments General comments (skin integrity, edema, etc.): after amb to bathroom, pt reports feeling dizzy/ chest pain; then reports palpations and nurse in room to assess/address. HR 150 bpm after amb to bathroom, recovers to 88 after approx 3 min seated rest break; BP 134/58, spo2 >95 on RA;    Exercises     Assessment/Plan    PT Assessment Patient needs continued PT services  PT Problem List Decreased activity tolerance;Decreased balance;Decreased mobility;Decreased knowledge of use of DME       PT Treatment Interventions DME instruction;Gait training;Stair training;Patient/family education;Neuromuscular re-education;Functional mobility training;Therapeutic activities;Therapeutic exercise;Balance training;Canalith reposition    PT Goals (Current goals can be found in the Care Plan section)  Acute Rehab PT Goals Patient Stated Goal: to go home PT Goal Formulation: With patient Time For Goal Achievement: 07/10/24 Potential to Achieve Goals: Good    Frequency Min 2X/week     Co-evaluation               AM-PAC PT 6 Clicks Mobility  Outcome Measure Help needed turning from your back to your side while in a flat bed without using bedrails?: A  Little Help needed moving from lying on your back to sitting on the side of a flat bed without using bedrails?: A Little Help needed moving to and from a bed to a chair (including a wheelchair)?: A Little Help needed standing up from a chair using your arms (e.g., wheelchair or bedside chair)?: A Little Help needed to walk in hospital room?: A Little Help needed climbing 3-5 steps with a railing? : A Little 6 Click Score: 18    End of Session   Activity Tolerance: Patient tolerated treatment well Patient left: in chair;with call bell/phone within reach;with chair alarm set;with family/visitor present Nurse Communication: Mobility status PT Visit Diagnosis: Muscle weakness (generalized) (M62.81);Other abnormalities of gait and mobility (R26.89);Dizziness and giddiness (R42)    Time: 8549-8496 PT Time Calculation (min) (ACUTE ONLY): 13 min   Charges:   PT Evaluation $PT Eval Moderate Complexity: 1 Mod   PT General Charges $$ ACUTE PT VISIT: 1 Visit         Maryanne Finder, PT, DPT Physical Therapist - [facility]  [facility]   Neetu Carrozza A Damarian Priola 06/26/2024, 4:02 PM

## 2024-06-27 ENCOUNTER — Inpatient Hospital Stay: Admitting: Anesthesiology

## 2024-06-27 ENCOUNTER — Encounter
Admission: EM | Disposition: A | Discharge status: Home Health | Payer: Self-pay | Source: Home / Self Care | Attending: Internal Medicine

## 2024-06-27 ENCOUNTER — Encounter: Payer: Self-pay | Admitting: Internal Medicine

## 2024-06-27 DIAGNOSIS — K449 Diaphragmatic hernia without obstruction or gangrene: Secondary | ICD-10-CM

## 2024-06-27 DIAGNOSIS — N179 Acute kidney failure, unspecified: Secondary | ICD-10-CM | POA: Diagnosis not present

## 2024-06-27 HISTORY — PX: ESOPHAGOGASTRODUODENOSCOPY: SHX5428

## 2024-06-27 LAB — POTASSIUM: Potassium: 4.2 mmol/L (ref 3.5–5.1)

## 2024-06-27 LAB — MAGNESIUM: Magnesium: 1.6 mg/dL — ABNORMAL LOW (ref 1.7–2.4)

## 2024-06-27 MED ORDER — ESMOLOL HCL 100 MG/10ML IV SOLN
INTRAVENOUS | Status: AC
  Filled 2024-06-27: qty 10

## 2024-06-27 MED ORDER — POTASSIUM CHLORIDE 10 MEQ/100ML IV SOLN: 10.0000 meq | Freq: Once | INTRAVENOUS | Status: DC

## 2024-06-27 MED ORDER — PROPOFOL 1000 MG/100ML IV EMUL
INTRAVENOUS | Status: AC
  Filled 2024-06-27: qty 100

## 2024-06-27 MED ORDER — POTASSIUM CHLORIDE 10 MEQ/100ML IV SOLN
10.0000 meq | INTRAVENOUS | Status: AC
  Administered 2024-06-27 (×2): 10 meq via INTRAVENOUS
  Filled 2024-06-27: qty 100

## 2024-06-27 MED ORDER — PROPOFOL 10 MG/ML IV BOLUS
INTRAVENOUS | Status: DC | PRN
  Administered 2024-06-27: 20 mg via INTRAVENOUS
  Administered 2024-06-27: 10 mg via INTRAVENOUS
  Administered 2024-06-27 (×3): 20 mg via INTRAVENOUS
  Administered 2024-06-27: 10 mg via INTRAVENOUS

## 2024-06-27 MED ORDER — LIDOCAINE HCL (PF) 2 % IJ SOLN
INTRAMUSCULAR | Status: AC
Start: 2024-06-27 — End: 2024-06-27
  Filled 2024-06-27: qty 10

## 2024-06-27 MED ORDER — ESMOLOL HCL 100 MG/10ML IV SOLN
INTRAVENOUS | Status: DC | PRN
  Administered 2024-06-27: 10 mg via INTRAVENOUS
  Administered 2024-06-27: 20 mg via INTRAVENOUS

## 2024-06-27 MED ORDER — POTASSIUM CHLORIDE CRYS ER 20 MEQ PO TBCR
20.0000 meq | EXTENDED_RELEASE_TABLET | Freq: Once | ORAL | Status: AC
  Administered 2024-06-27: 20 meq via ORAL
  Filled 2024-06-27 (×2): qty 1

## 2024-06-27 MED ORDER — HYDROCODONE-ACETAMINOPHEN 5-325 MG PO TABS
1.0000 | ORAL_TABLET | Freq: Four times a day (QID) | ORAL | Status: DC | PRN
  Administered 2024-06-27 – 2024-06-29 (×2): 1 via ORAL
  Filled 2024-06-27 (×2): qty 1

## 2024-06-27 MED ORDER — GLYCOPYRROLATE 0.2 MG/ML IJ SOLN
INTRAMUSCULAR | Status: DC | PRN
  Administered 2024-06-27: 4 mg via INTRAVENOUS

## 2024-06-27 NOTE — Anesthesia Preprocedure Evaluation (Signed)
 Anesthesia Evaluation  Patient identified by MRN, date of birth, ID band Patient awake    Reviewed: Allergy & Precautions, NPO status , Patient's Chart, lab work & pertinent test results  Airway Mallampati: III  TM Distance: >3 FB Neck ROM: full    Dental  (+) Lower Dentures   Pulmonary neg pulmonary ROS   Pulmonary exam normal  + decreased breath sounds      Cardiovascular Exercise Tolerance: Poor hypertension, Pt. on medications +CHF  negative cardio ROS Normal cardiovascular exam Rhythm:Regular Rate:Normal     Neuro/Psych  Headaches      Dementia negative neurological ROS  negative psych ROS   GI/Hepatic negative GI ROS, Neg liver ROS,GERD  Medicated,,  Endo/Other  negative endocrine ROSHypothyroidism    Renal/GU Renal InsufficiencyRenal diseasenegative Renal ROS  negative genitourinary   Musculoskeletal   Abdominal Normal abdominal exam  (+)   Peds negative pediatric ROS (+)  Hematology negative hematology ROS (+)   Anesthesia Other Findings Past Medical History: No date: Arthritis No date: Breast cancer (HCC) No date: Depression No date: GERD (gastroesophageal reflux disease) No date: HLD (hyperlipidemia) No date: Hypertension No date: Hypothyroidism  Past Surgical History: 1982: ABDOMINAL HYSTERECTOMY 11/20/2021: AXILLARY SENTINEL NODE BIOPSY; Left     Comment:  Procedure: AXILLARY SENTINEL NODE BIOPSY;  Surgeon:               Tye Millet, DO;  Location: [facility] ORS;  Service: General;              Laterality: Left; 09/23/2021: BREAST BIOPSY; Left     Comment:  3:00 13cmfn venus marker, path pending 09/23/2021: BREAST BIOPSY; Left     Comment:  3:00 14 cmfn heart marker, path pending No date: CHOLECYSTECTOMY 10/08/2021: ESOPHAGOGASTRODUODENOSCOPY (EGD) WITH PROPOFOL ; N/A     Comment:  Procedure: ESOPHAGOGASTRODUODENOSCOPY (EGD) WITH               PROPOFOL ;  Surgeon: Therisa Bi, MD;  Location:  [facility]               ENDOSCOPY;  Service: Gastroenterology;  Laterality: N/A; 11/21/2021: HEMATOMA EVACUATION; Left     Comment:  Procedure: EVACUATION HEMATOMA;  Surgeon: Tye Millet,               DO;  Location: [facility] ORS;  Service: General;  Laterality:               Left; 08/17/2022: IR FLUORO GUIDE CV LINE LEFT 08/17/2022: IR US  GUIDE VASC ACCESS LEFT 08/17/2022: IR US  GUIDE VASC ACCESS RIGHT 2019: left lumpectomy 11/20/2021: TOTAL MASTECTOMY; Left     Comment:  Procedure: TOTAL MASTECTOMY;  Surgeon: Tye Millet, DO;              Location: [facility] ORS;  Service: General;  Laterality: Left;  BMI    Body Mass Index: 25.26 kg/m      Reproductive/Obstetrics negative OB ROS                              Anesthesia Physical Anesthesia Plan  ASA: 3  Anesthesia Plan: General   Post-op Pain Management:    Induction: Intravenous  PONV Risk Score and Plan: Propofol  infusion and TIVA  Airway Management Planned: Natural Airway and Nasal Cannula  Additional Equipment:   Intra-op Plan:   Post-operative Plan:   Informed Consent: I have reviewed the patients History and Physical, chart, labs and discussed the procedure including the  risks, benefits and alternatives for the proposed anesthesia with the patient or authorized representative who has indicated his/her understanding and acceptance.     Dental Advisory Given  Plan Discussed with: CRNA and Surgeon  Anesthesia Plan Comments:          Anesthesia Quick Evaluation

## 2024-06-27 NOTE — Op Note (Signed)
 [facility] Gastroenterology Patient Name: Cindy Huerta Procedure Date: 06/27/2024 10:48 AM MRN: [id number] Account #: [id number] Date of Birth: 02-07-1946 Admit Type: Inpatient Age: 78 Room: [facility] ENDO ROOM 3 Gender: Female Note Status: Finalized Instrument Name: Barnie Endoscope [id number] Procedure:             Upper GI endoscopy Indications:           Nausea with vomiting Providers:             Rogelia Copping MD, MD Referring MD:          Alm HERO. Glover, MD (Referring MD) Medicines:             Propofol  per Anesthesia Complications:         No immediate complications. Procedure:             Pre-Anesthesia Assessment:                        - Prior to the procedure, a History and Physical was                         performed, and patient medications and allergies were                         reviewed. The patient's tolerance of previous                         anesthesia was also reviewed. The risks and benefits                         of the procedure and the sedation options and risks                         were discussed with the patient. All questions were                         answered, and informed consent was obtained. Prior                         Anticoagulants: The patient has taken no anticoagulant                         or antiplatelet agents. ASA Grade Assessment: II - A                         patient with mild systemic disease. After reviewing                         the risks and benefits, the patient was deemed in                         satisfactory condition to undergo the procedure.                        After obtaining informed consent, the endoscope was                         passed under direct vision. Throughout the procedure,  the patient's blood pressure, pulse, and oxygen                         saturations were monitored continuously. The Endoscope                         was introduced through the mouth,  and advanced to the                         second part of duodenum. The upper GI endoscopy was                         accomplished without difficulty. The patient tolerated                         the procedure well. Findings:      A small hiatal hernia was present.      The stomach was normal.      The examined duodenum was normal. Impression:            - Small hiatal hernia.                        - Normal stomach.                        - Normal examined duodenum.                        - No specimens collected.                        - The cause of the Intermittent nausea and vomiting is                         likely functional and not structural. Recommendation:        - Return patient to hospital ward for ongoing care.                        - Soft diet.                        - Continue present medications. Procedure Code(s):     --- Professional ---                        [phone], Esophagogastroduodenoscopy, flexible,                         transoral; diagnostic, including collection of                         specimen(s) by brushing or washing, when performed                         (separate procedure) Diagnosis Code(s):     --- Professional ---                        R11.2, Nausea with vomiting, unspecified CPT copyright 2022 American Medical Association. All rights reserved. The codes documented in this report are preliminary and upon coder review may  be revised to meet current compliance requirements. Rogelia Copping MD, MD 06/27/2024 2:07:45 PM This report has been signed electronically. Number of Addenda: 0 Note Initiated On: 06/27/2024 10:48 AM Estimated Blood Loss:  Estimated blood loss: none.      [facility]

## 2024-06-27 NOTE — Plan of Care (Signed)
  Problem: Education: Goal: Knowledge of General Education information will improve Description: Including pain rating scale, medication(s)/side effects and non-pharmacologic comfort measures Outcome: Progressing   Problem: Health Behavior/Discharge Planning: Goal: Ability to manage health-related needs will improve Outcome: Progressing   Problem: Activity: Goal: Risk for activity intolerance will decrease Outcome: Progressing   Problem: Elimination: Goal: Will not experience complications related to bowel motility Outcome: Progressing   Problem: Nutrition: Goal: Adequate nutrition will be maintained Outcome: Not Progressing   Problem: Coping: Goal: Level of anxiety will decrease Outcome: Not Progressing   Problem: Pain Managment: Goal: General experience of comfort will improve and/or be controlled Outcome: Not Progressing

## 2024-06-27 NOTE — Progress Notes (Signed)
 The patient underwent EGD today without any sign of the cause for her intermittent nausea and vomiting.  The patient also had a previous EGD by Dr. Therisa for the same issue 2 years ago without any abnormalities to explain her symptoms at that time also.  The patient's nausea and vomiting is likely functional and not structural.  Nothing further to do from a GI point of view.  I will sign off.  Please call if any further GI concerns or questions.  We would like to thank you for the opportunity to participate in the care of Cindy Huerta.

## 2024-06-27 NOTE — Transfer of Care (Signed)
 Immediate Anesthesia Transfer of Care Note  Patient: Cindy Huerta  Procedure(s) Performed: EGD (ESOPHAGOGASTRODUODENOSCOPY)  Patient Location: PACU  Anesthesia Type:General  Level of Consciousness: sedated  Airway & Oxygen Therapy: Patient Spontanous Breathing  Post-op Assessment: Report given to RN and Post -op Vital signs reviewed and stable  Post vital signs: Reviewed and stable  Last Vitals:  Vitals Value Taken Time  BP 122/65 06/27/24 14:16  Temp    Pulse 112 06/27/24 14:16  Resp 22 06/27/24 14:16  SpO2 100 % 06/27/24 14:16  Vitals shown include unfiled device data.  Last Pain:  Vitals:   06/27/24 1212  TempSrc: Temporal  PainSc: 0-No pain         Complications: No notable events documented.

## 2024-06-27 NOTE — Progress Notes (Signed)
 OT Cancellation Note  Patient Details Name: Cindy Huerta MRN: [id number] DOB: 1946-08-23   Cancelled Treatment:    Reason Eval/Treat Not Completed: Patient at procedure or test/ unavailable. Pt is off floor for EGD this date. Will attempt OT at a later time/date, as pt is available.  Suzen Hock 06/27/2024, 12:51 PM

## 2024-06-27 NOTE — Progress Notes (Signed)
 Progress Note   Patient: Cindy Huerta FMW:[id number] DOB: March 09, 1946 DOA: 06/25/2024     1 DOS: the patient was seen and examined on 06/27/2024   Brief hospital course:  HPI from admission: Cindy Huerta is a 78 y.o. female with medical history significant of dementia, hypertension, hyperlipidemia, chronic HFpEF, GERD, CAD, hypothyroidism, with multiple recent admissions for repeated nausea, vomiting presented with worsening of intractable abdominal pain nauseous vomiting for 2 days.  In the ED patient had stable vitals.  CT abdomen pelvis with contrast showed no acute findings intra-abdominal, right lower lobe groundglass like changes likely pneumonia,.  BMP showed hyponatremia with sodium 126.   creatinine 1.2 WBC 6.7 hemoglobin 10.2.SABRASABRA  See H&P for full HPI on admission & ED course.  Pt was admitted for further evaluation and management including GI consultation with plan for EGD, at patient's Huerta's request.   Further hospital course and management as outlined below.   Assessment and Plan:  Intractable nausea with vomiting CT abdomen pelvis no acute findings.  EGD done 2 years back was normal.   Patient states that she does have acute burst of vomiting and has had frequent admissions in the past and wishes to have GI consultation for possible need for EGD.  Denies history of diabetes.   - GI consulted - Plan for EGD today - NPO, diet resume post-EGD if okay from GI standpoint - PRN Reglan  due to QTc prolongation   AKI with acute anion gap and non-anion gap metabolic acidosis Creatinine of 1.2 on presentation.  Baseline creatinine of 0.7.   Improved with IV hydration - Monitor BMP - On IV fluids until after EGD and diet resumed   Hyponatremia - secondary to hypovolemia.  Improved with IV hydration - Monitor BMP   Hypokalemia.  Potassium of 2.9 again today.   - IV and PO replacement ordered - Goal K > 3 for anesthesia to sedate for EGD - Repeat K level at noon today - BMP  in AM, check Mg level   Lactic acidosis - Secondary to volume depletion.   Lactate improved from 4.9 to 1.1 after hydration.   RLL pneumonia Likely aspiration secondary to repeated vomiting.  Afebrile. - Continue Augmentin   - Closely monitor respiratory status   Chronic HFpEF Receiving IV fluids due to volume depletion.  - Monitor volume status closely - Continue metoprolol  - Hold off ARB and hydralazine  for now   Dementia At baseline. No reports of behavioral disturbances - Delirium precautions   Anxiety - Continue Ativan  as needed.   Hypothyroidism - Continue Synthroid     Debility weakness - PT OT evaluations - Fall precautions      Subjective: Pt seen up in recliner this AM.  Pt denies N/V this AM but reports her stomach feels shaky.  No fever/chills, chest pain, shortness of breath or other acute complaints.   Physical Exam: Vitals:   06/27/24 0348 06/27/24 0831 06/27/24 1014 06/27/24 1212  BP: (!) 145/67 (!) 156/67  (!) 174/60  Pulse: (!) 103 (!) 104  99  Resp:  16  18  Temp:   98.2 F (36.8 C) (!) 96.9 F (36.1 C)  TempSrc:    Temporal  SpO2: 100% 100%  100%  Weight:      Height:       General exam: awake, alert, no acute distress, appears anxious HEENT: moist mucus membranes, hearing grossly normal  Respiratory system: CTAB, no wheezes, rales or rhonchi, normal respiratory effort. Cardiovascular system: normal S1/S2, RRR,  no pedal edema.   Gastrointestinal system: soft, NT, ND Central nervous system: A&O x 2+. no gross focal neurologic deficits, normal speech Extremities: moves all , no edema, normal tone Skin: dry, intact, normal temperature Psychiatry: anxious mood, flat affect, judgement and insight appear normal  Data Reviewed:  No new labs today -- Notable from 7/22 -- K 2.9, BUN 6, Ca 8.7, lactate 1.1  Family Communication: None present. Will attempt to call post-procedure this afternoon.  Disposition: Status is: Inpatient Remains  inpatient appropriate because: ongoing evaluation as above   Planned Discharge Destination: Home with Home Health    Time spent: 45 minutes  Author: Burnard DELENA Cunning, DO 06/27/2024 2:02 PM  For on call review [email or web address].

## 2024-06-28 DIAGNOSIS — N179 Acute kidney failure, unspecified: Secondary | ICD-10-CM | POA: Diagnosis not present

## 2024-06-28 LAB — BASIC METABOLIC PANEL WITH GFR
Anion gap: 8 (ref 5–15)
BUN: 8 mg/dL (ref 8–23)
CO2: 23 mmol/L (ref 22–32)
Calcium: 8.9 mg/dL (ref 8.9–10.3)
Chloride: 104 mmol/L (ref 98–111)
Creatinine, Ser: 0.96 mg/dL (ref 0.44–1.00)
GFR, Estimated: 60 mL/min — ABNORMAL LOW (ref >60)
Glucose, Bld: 82 mg/dL (ref 70–99)
Potassium: 3.9 mmol/L (ref 3.5–5.1)
Sodium: 135 mmol/L (ref 135–145)

## 2024-06-28 LAB — CBC
HCT: 28 % — ABNORMAL LOW (ref 36.0–46.0)
Hemoglobin: 9.6 g/dL — ABNORMAL LOW (ref 12.0–15.0)
MCH: 31.5 pg (ref 26.0–34.0)
MCHC: 34.3 g/dL (ref 30.0–36.0)
MCV: 91.8 fL (ref 80.0–100.0)
Platelets: 230 K/uL (ref 150–400)
RBC: 3.05 MIL/uL — ABNORMAL LOW (ref 3.87–5.11)
RDW: 14.3 % (ref 11.5–15.5)
WBC: 12.1 K/uL — ABNORMAL HIGH (ref 4.0–10.5)
nRBC: 0 % (ref 0.0–0.2)

## 2024-06-28 MED ORDER — POLYETHYLENE GLYCOL 3350 17 G PO PACK
17.0000 g | PACK | Freq: Every day | ORAL | Status: DC
  Administered 2024-06-28 – 2024-06-29 (×2): 17 g via ORAL
  Filled 2024-06-28 (×2): qty 1

## 2024-06-28 MED ORDER — MAGNESIUM SULFATE 2 GM/50ML IV SOLN
2.0000 g | Freq: Once | INTRAVENOUS | Status: AC
  Administered 2024-06-28: 2 g via INTRAVENOUS
  Filled 2024-06-28: qty 50

## 2024-06-28 MED ORDER — METOPROLOL SUCCINATE ER 50 MG PO TB24
50.0000 mg | ORAL_TABLET | Freq: Two times a day (BID) | ORAL | Status: DC
  Administered 2024-06-28 – 2024-06-29 (×3): 50 mg via ORAL
  Filled 2024-06-28 (×3): qty 1

## 2024-06-28 NOTE — Progress Notes (Signed)
 Progress Note   Patient: Cindy Huerta FMW:[id number] DOB: 1946-07-16 DOA: 06/25/2024     2 DOS: the patient was seen and examined on 06/28/2024   Brief hospital course:  HPI from admission: Cindy Huerta is a 78 y.o. female with medical history significant of dementia, hypertension, hyperlipidemia, chronic HFpEF, GERD, CAD, hypothyroidism, with multiple recent admissions for repeated nausea, vomiting presented with worsening of intractable abdominal pain nauseous vomiting for 2 days.  In the ED patient had stable vitals.  CT abdomen pelvis with contrast showed no acute findings intra-abdominal, right lower lobe groundglass like changes likely pneumonia,.  BMP showed hyponatremia with sodium 126.   creatinine 1.2 WBC 6.7 hemoglobin 10.2.Cindy Huerta  See H&P for full HPI on admission & ED course.  Pt was admitted for further evaluation and management including GI consultation with plan for EGD, at patient's son's request.   Further hospital course and management as outlined below.   Assessment and Plan:  Intractable nausea with vomiting CT abdomen pelvis no acute findings.  EGD done 2 years back was normal.   Patient states that she does have acute burst of vomiting and has had frequent admissions in the past and wishes to have GI consultation for possible need for EGD.  Denies history of diabetes.   - GI consulted - EGD on 7/23 was unremarkable.  GI suspects symptoms are functional, not structural - Diet resumed - PRN Reglan  due to QTc prolongation - Bowel regimen to prevent constipation - Mgmt of anxiety Vinluan term   AKI with acute anion gap and non-anion gap metabolic acidosis Creatinine of 1.2 on presentation.  Baseline creatinine of 0.7.   Improved with IV hydration. Cr 0.93 >> 0.96 - Monitor BMP - Off IV fluids - Encourage PO intake   Hyponatremia - secondary to hypovolemia.  Resolved with IV hydration - Monitor BMP   Hypokalemia.  Resolved with replacement 7/22, 7/23  - BMP &  Mg level in AM   Lactic acidosis - Secondary to volume depletion.   Lactate improved from 4.9 to 1.1 after hydration.   RLL pneumonia Likely aspiration secondary to repeated vomiting.  Afebrile. - Continue Augmentin   - Closely monitor respiratory status   Chronic HFpEF Receiving IV fluids due to volume depletion.  - Monitor volume status closely - Resume home metoprolol  held on admission - Hold off ARB and hydralazine  for now   Dementia At baseline. No reports of behavioral disturbances - Delirium precautions   Anxiety - Continue Ativan  as needed.   Hypothyroidism - Continue Synthroid     Debility weakness - PT OT evaluations - Fall precautions      Subjective: Pt up in recliner this AM when seen.  She had just finished eating breakfast, denies abdominal pain or nausea/vomiting.  Last time Reglan  given was 1735 yesterday evening.  Pt denies other new complaints.   Physical Exam: Vitals:   06/28/24 0520 06/28/24 0730 06/28/24 0944 06/28/24 1201  BP: (!) 127/56 (!) 130/51 (!) 134/59 (!) 133/56  Pulse: (!) 106  98 93  Resp: 14 18 17 17   Temp:  98.3 F (36.8 C) 98.1 F (36.7 C) 98.4 F (36.9 C)  TempSrc:      SpO2: 95% 100% 100% 99%  Weight:      Height:       General exam: awake, alert, no acute distress HEENT: moist mucus membranes, hearing grossly normal  Respiratory system: CTAB, no wheezes, rales or rhonchi, normal respiratory effort. Cardiovascular system: normal S1/S2, RRR, no  pedal edema.   Gastrointestinal system: soft, NT, ND Central nervous system: A&O x 2+. no gross focal neurologic deficits, normal speech Extremities: no trembling seen today, no edema, normal tone Skin: dry, intact, normal temperature Psychiatry: anxious mood, flat affect, judgement and insight appear normal    Data Reviewed:  Notable labs -- Mg 1.6, WBC 12.1, Hbg 9.6    EGD 7/23 -- unremarkable for findings to explain N/V/abdominal pain Impression: - Small hiatal  hernia. - Normal stomach. - Normal examined duodenum. - No specimens collected. - The cause of the Intermittent nausea and vomiting is likely functional and not structural.    Family Communication: Unable to reach son Cindy Huerta by phone when attempted this afternoon. Will try again later as time allows.    Disposition: Status is: Inpatient Remains inpatient appropriate because: electrolyte abnormalities, closely monitoring for ongoing clinical improvement & stability before d/c.  Expect d/c in 1-2 days.    Planned Discharge Destination: Home with Home Health    Time spent: 38 minutes  Author: Burnard DELENA Cunning, DO 06/28/2024 3:12 PM  For on call review [email or web address].

## 2024-06-28 NOTE — Progress Notes (Signed)
 Occupational Therapy Treatment Patient Details Name: Cindy Huerta MRN: [id number] DOB: 04-May-1946 Today's Date: 06/28/2024   History of present illness Pt is an 78 year old female presented with worsening of intractable abdominal pain nausea/ vomiting, admitted with AKI, hyponatremia, lactic acidosis, RLL pneumonia,          PMH significant for dementia, HTN, HLD, chronic HFpEF, GERD, CAD, hypothyroidism, multiple recent admissions for repeated nausea/ vomiting   OT comments  Ms Kotz seen for OT treatment on this date. Upon arrival to room pt seated on toilet w/ NT preparing her for a spongebath. Pt requires CGA for toileting; MIN A for UB bathing, requiring assistance w/ washing back; MOD A for LB bathing seated EOB w/ pt washing peri area in standing. Pt required MIN A for UB/LB dressing, requiring assistance w/ pulling up underwear in standing. HR in 150's w/ activity and pt reporting dizziness upon standing, MD notified. Vitals after returning to bed: BP 118/48, HR 88; SpO2 98% on RA. Pt making good progress toward goals, will continue to follow POC. Discharge recommendation remains appropriate.        If plan is discharge home, recommend the following:  A little help with walking and/or transfers;A little help with bathing/dressing/bathroom;Assistance with cooking/housework;Supervision due to cognitive status   Equipment Recommendations       Recommendations for Other Services      Precautions / Restrictions Precautions Precautions: Fall Recall of Precautions/Restrictions: Impaired Restrictions Weight Bearing Restrictions Per Provider Order: No       Mobility Bed Mobility Overal bed mobility: Needs Assistance Bed Mobility: Sit to Supine       Sit to supine: Contact guard assist        Transfers Overall transfer level: Needs assistance Equipment used: Rolling walker (2 wheels) Transfers: Sit to/from Stand Sit to Stand: Contact guard assist                  Balance Overall balance assessment: Needs assistance Sitting-balance support: No upper extremity supported, Feet supported Sitting balance-Leahy Scale: Good     Standing balance support: Single extremity supported, During functional activity, Reliant on assistive device for balance Standing balance-Leahy Scale: Good                             ADL either performed or assessed with clinical judgement   ADL Overall ADL's : Needs assistance/impaired                                       General ADL Comments: CGA for toileting; MIN-MOD A for UB/LB bathing seated EOB; MIN A for UB/LB dressing    Extremity/Trunk Assessment Upper Extremity Assessment Upper Extremity Assessment: Overall WFL for tasks assessed   Lower Extremity Assessment Lower Extremity Assessment: Generalized weakness        Vision       Perception     Praxis     Communication Communication Communication: No apparent difficulties   Cognition Arousal: Alert Behavior During Therapy: WFL for tasks assessed/performed Cognition: History of cognitive impairments, Cognition impaired                               Following commands: Intact        Cueing   Cueing Techniques: Verbal cues, Gestural cues  Exercises      Shoulder Instructions       General Comments HR in 150's w/ activity; BP 118/48; HR 88;SpO2 98% on RA    Pertinent Vitals/ Pain       Pain Assessment Pain Assessment: PAINAD Breathing: occasional labored breathing, short period of hyperventilation Negative Vocalization: none Facial Expression: smiling or inexpressive Body Language: relaxed Consolability: no need to console PAINAD Score: 1 Pain Location: chest Pain Intervention(s): Limited activity within patient's tolerance, Monitored during session  Home Living                                          Prior Functioning/Environment              Frequency  Min  2X/week        Progress Toward Goals  OT Goals(current goals can now be found in the care plan section)  Progress towards OT goals: Progressing toward goals  Acute Rehab OT Goals Patient Stated Goal: to feel better OT Goal Formulation: With patient Time For Goal Achievement: 07/12/24 Potential to Achieve Goals: Good ADL Goals Pt Will Perform Grooming: with supervision;sitting;standing Pt Will Perform Upper Body Dressing: with supervision;sitting Pt Will Perform Lower Body Dressing: with supervision;sit to/from stand;sitting/lateral leans Pt Will Transfer to Toilet: with supervision;ambulating Pt Will Perform Toileting - Clothing Manipulation and hygiene: with supervision;sitting/lateral leans;sit to/from stand  Plan      Co-evaluation                 AM-PAC OT 6 Clicks Daily Activity     Outcome Measure   Help from another person eating meals?: None Help from another person taking care of personal grooming?: None Help from another person toileting, which includes using toliet, bedpan, or urinal?: A Little Help from another person bathing (including washing, rinsing, drying)?: A Little Help from another person to put on and taking off regular upper body clothing?: None Help from another person to put on and taking off regular lower body clothing?: A Little 6 Click Score: 21    End of Session Equipment Utilized During Treatment: Rolling walker (2 wheels)  OT Visit Diagnosis: Unsteadiness on feet (R26.81);Other abnormalities of gait and mobility (R26.89);Muscle weakness (generalized) (M62.81)   Activity Tolerance Patient tolerated treatment well   Patient Left in bed;with call bell/phone within reach;with bed alarm set   Nurse Communication          Time: 8965-8943 OT Time Calculation (min): 22 min  Charges: OT General Charges $OT Visit: 1 Visit OT Treatments $Self Care/Home Management : 8-22 mins  [place], Student OT   [employer] 06/28/2024, 1:15 PM

## 2024-06-28 NOTE — Progress Notes (Signed)
 Physical Therapy Treatment Patient Details Name: Cindy Huerta MRN: [id number] DOB: November 11, 1946 Today's Date: 06/28/2024   History of Present Illness Pt is an 78 year old female presented with worsening of intractable abdominal pain nausea/ vomiting, admitted with AKI, hyponatremia, lactic acidosis, RLL pneumonia,          PMH significant for dementia, HTN, HLD, chronic HFpEF, GERD, CAD, hypothyroidism, multiple recent admissions for repeated nausea/ vomiting    PT Comments  Pt was pleasant and eager to do some activity with PT.  She managed 2 bouts of ambulation (50, 35 ft), slow but relatively safe.  She did have elevated HR ~130s with each effort, which is less elevated than previous ambulation sessions, but she continues to feel very fatigued/tired with the effort.  SpO2 remained in the high 90s on room air.  Pt will benefit from ongoing PT to address functional limitations.       If plan is discharge home, recommend the following: A little help with walking and/or transfers;A little help with bathing/dressing/bathroom;Help with stairs or ramp for entrance;Assist for transportation;Assistance with cooking/housework;Supervision due to cognitive status   Can travel by private vehicle     Yes  Equipment Recommendations  None recommended by PT    Recommendations for Other Services       Precautions / Restrictions Precautions Precautions: Fall Recall of Precautions/Restrictions: Impaired Restrictions Weight Bearing Restrictions Per Provider Order: No     Mobility  Bed Mobility               General bed mobility comments: seated in recliner on arrival    Transfers Overall transfer level: Needs assistance Equipment used: Rolling walker (2 wheels) Transfers: Sit to/from Stand Sit to Stand: Contact guard assist           General transfer comment: increased time    Ambulation/Gait Ambulation/Gait assistance: Contact guard assist Gait Distance (Feet): 50  Feet Assistive device: Rolling walker (2 wheels)         General Gait Details: 50 ft then 35 ft.  HR up to 130s on first effort and 120s on second.  CGA and chair follow for safety. No overt LOB, fatigued and anxious after ambulation.   Stairs             Wheelchair Mobility     Tilt Bed    Modified Rankin (Stroke Patients Only)       Balance Overall balance assessment: Needs assistance Sitting-balance support: No upper extremity supported, Feet supported Sitting balance-Leahy Scale: Good     Standing balance support: Single extremity supported, During functional activity, Reliant on assistive device for balance Standing balance-Leahy Scale: Good                              Communication Communication Communication: No apparent difficulties  Cognition Arousal: Alert Behavior During Therapy: Anxious                             Following commands: Intact      Cueing Cueing Techniques: Verbal cues, Gestural cues  Exercises      General Comments        Pertinent Vitals/Pain Pain Assessment Pain Assessment: No/denies pain    Home Living                          Prior Function  PT Goals (current goals can now be found in the care plan section) Progress towards PT goals: Progressing toward goals    Frequency    Min 2X/week      PT Plan      Co-evaluation              AM-PAC PT 6 Clicks Mobility   Outcome Measure  Help needed turning from your back to your side while in a flat bed without using bedrails?: A Little Help needed moving from lying on your back to sitting on the side of a flat bed without using bedrails?: A Little Help needed moving to and from a bed to a chair (including a wheelchair)?: A Little Help needed standing up from a chair using your arms (e.g., wheelchair or bedside chair)?: A Little Help needed to walk in hospital room?: A Little Help needed climbing 3-5 steps  with a railing? : A Little 6 Click Score: 18    End of Session Equipment Utilized During Treatment: Gait belt Activity Tolerance: Patient tolerated treatment well Patient left: in chair;with call bell/phone within reach;with chair alarm set;with family/visitor present   PT Visit Diagnosis: Muscle weakness (generalized) (M62.81);Other abnormalities of gait and mobility (R26.89);Dizziness and giddiness (R42)     Time: 8441-8385 PT Time Calculation (min) (ACUTE ONLY): 16 min  Charges:    $Gait Training: 8-22 mins PT General Charges $$ ACUTE PT VISIT: 1 Visit                     Carmin JONELLE Deed, DPT 06/28/2024, 5:19 PM

## 2024-06-29 DIAGNOSIS — N179 Acute kidney failure, unspecified: Secondary | ICD-10-CM | POA: Diagnosis not present

## 2024-06-29 LAB — BASIC METABOLIC PANEL WITH GFR
Anion gap: 8 (ref 5–15)
BUN: 15 mg/dL (ref 8–23)
CO2: 27 mmol/L (ref 22–32)
Calcium: 9.1 mg/dL (ref 8.9–10.3)
Chloride: 102 mmol/L (ref 98–111)
Creatinine, Ser: 1.09 mg/dL — ABNORMAL HIGH (ref 0.44–1.00)
GFR, Estimated: 51 mL/min — ABNORMAL LOW (ref >60)
Glucose, Bld: 103 mg/dL — ABNORMAL HIGH (ref 70–99)
Potassium: 3.7 mmol/L (ref 3.5–5.1)
Sodium: 137 mmol/L (ref 135–145)

## 2024-06-29 LAB — CBC
HCT: 28.9 % — ABNORMAL LOW (ref 36.0–46.0)
Hemoglobin: 9.9 g/dL — ABNORMAL LOW (ref 12.0–15.0)
MCH: 31.1 pg (ref 26.0–34.0)
MCHC: 34.3 g/dL (ref 30.0–36.0)
MCV: 90.9 fL (ref 80.0–100.0)
Platelets: 251 K/uL (ref 150–400)
RBC: 3.18 MIL/uL — ABNORMAL LOW (ref 3.87–5.11)
RDW: 14 % (ref 11.5–15.5)
WBC: 11.5 K/uL — ABNORMAL HIGH (ref 4.0–10.5)
nRBC: 0 % (ref 0.0–0.2)

## 2024-06-29 LAB — MAGNESIUM: Magnesium: 1.7 mg/dL (ref 1.7–2.4)

## 2024-06-29 MED ORDER — ENSURE PLUS HIGH PROTEIN PO LIQD: 237.0000 mL | Freq: Two times a day (BID) | ORAL | Status: DC

## 2024-06-29 MED ORDER — LORAZEPAM 0.5 MG PO TABS: 0.5000 mg | ORAL_TABLET | Freq: Two times a day (BID) | ORAL | 0 refills | Status: DC | PRN

## 2024-06-29 MED ORDER — ONDANSETRON 4 MG PO TBDP: 4.0000 mg | ORAL_TABLET | Freq: Three times a day (TID) | ORAL | 0 refills | Status: DC | PRN

## 2024-06-29 NOTE — Discharge Summary (Signed)
 Physician Discharge Summary   Patient: Cindy Huerta MRN: [id number] DOB: 06/11/1946  Admit date:     06/25/2024  Discharge date: 06/29/2024  Discharge Physician: Burnard DELENA Cunning   PCP: Glover [name], MD   Recommendations at discharge:   Follow up with Primary Care in 1-2 weeks Repeat CBC, CMP, Mg at follow up Follow up outpatient with Gastroenterology in persistent issues with abdominal pain, nausea/vomiting  Discharge Diagnoses: Active Problems:   HTN (hypertension)   Dementia (HCC)   Depression with anxiety   HLD (hyperlipidemia)  Principal Problem (Resolved):   AKI (acute kidney injury) (HCC) Resolved Problems:   Hyponatremia   Hypokalemia   Elevated lactic acid level   Intractable vomiting  Hospital Course:  HPI from admission: Cindy Huerta is a 78 y.o. female with medical history significant of dementia, hypertension, hyperlipidemia, chronic HFpEF, GERD, CAD, hypothyroidism, with multiple recent admissions for repeated nausea, vomiting presented with worsening of intractable abdominal pain nauseous vomiting for 2 days.  In the ED patient had stable vitals.  CT abdomen pelvis with contrast showed no acute findings intra-abdominal, right lower lobe groundglass like changes likely pneumonia,.  BMP showed hyponatremia with sodium 126.   creatinine 1.2 WBC 6.7 hemoglobin 10.2.SABRASABRA  See H&P for full HPI on admission & ED course.   Pt was admitted for further evaluation and management including GI consultation with plan for EGD, at patient's son's request.    Further hospital course and management as outlined below.   7/25 -- pt continues to do well today, tolerating meals, no issues with abdominal pain or nausea/vomiting.  Pt feels like her anxiety may contribute and asks if the medicine we've used here can be continued at home.  Spoke with son by phone & updated. Pt is medically stable for discharge, pt and son in agreement.   Assessment and Plan:  Intractable  nausea with vomiting CT abdomen pelvis no acute findings.  EGD done 2 years back was normal.   Patient states that she does have acute burst of vomiting and has had frequent admissions in the past and wishes to have GI consultation for possible need for EGD.  Denies history of diabetes.   - GI consulted - EGD on 7/23 was unremarkable.  GI suspects symptoms are functional, not structural - Diet resumed & being well tolerated - Anti-emetics PRN - Bowel regimen to prevent constipation - Mgmt of anxiety Steinruck term - Follow up as outpatient with GI   AKI with acute anion gap and non-anion gap metabolic acidosis Creatinine of 1.2 on presentation.  Baseline creatinine of 0.7.   Improved with IV hydration. Resolved. - Off IV fluids - Encourage PO intake - Repeat BMP at follow up   Hyponatremia - secondary to hypovolemia from N/V.  Resolved with IV hydration - Monitor BMP - Encourage PO hydration   Hypokalemia.  Resolved with replacement 7/22, 7/23  - BMP & Mg level in AM   Lactic acidosis - Secondary to volume depletion.  Resolved Lactate improved from 4.9 to 1.1 after hydration.   RLL pneumonia Likely aspiration secondary to repeated vomiting.  Afebrile. - Continue Augmentin   - Closely monitor respiratory status   Chronic HFpEF Receiving IV fluids due to volume depletion.  - Monitor volume status closely - Resume home metoprolol  held on admission - Hold off ARB and hydralazine  for now   Dementia At baseline. No reports of behavioral disturbances - Delirium precautions   Anxiety - Continue Ativan  as needed.   Hypothyroidism -  Continue Synthroid     Debility weakness - PT OT evaluations - Fall precautions         Consultants: GI Procedures performed: EGD  Disposition: Home health Diet recommendation:  Cardiac diet DISCHARGE MEDICATION: Allergies as of 06/29/2024       Reactions   Latex Rash   Novocain [procaine] Other (See Comments)   Unsure - told by DDS not  to let anyone give it to her  Confusion   Zestril [lisinopril] Cough   Benadryl  [diphenhydramine ] Other (See Comments)   Jitteriness Agitation   Biaxin [clarithromycin] Other (See Comments)   Confusion   Roxicodone [oxycodone] Nausea And Vomiting, Anxiety        Medication List     TAKE these medications    acetaminophen  500 MG tablet Commonly known as: TYLENOL  Take 500-1,000 mg by mouth daily as needed for moderate pain (pain score 4-6) or mild pain (pain score 1-3).   amLODipine  10 MG tablet Commonly known as: NORVASC  Take 1 tablet (10 mg total) by mouth daily.   Artificial Tears 0.1-0.3 % Soln Generic drug: Dextran 70-Hypromellose Place 1 drop into both eyes daily as needed for dry eyes.   aspirin  EC 81 MG tablet Take 1 tablet (81 mg total) by mouth daily. RESTART 48HRS AFTER DISCHARGE What changed:  when to take this additional instructions   atorvastatin  80 MG tablet Commonly known as: LIPITOR  Take 80 mg by mouth daily.   cloNIDine  0.1 MG tablet Commonly known as: CATAPRES  Take 0.1 mg by mouth daily.   feeding supplement Liqd Take 237 mLs by mouth 2 (two) times daily between meals.   ferrous sulfate  325 (65 FE) MG EC tablet Take 325 mg by mouth at bedtime.   FLUoxetine  20 MG capsule Commonly known as: PROZAC  Take 20 mg by mouth daily.   fluticasone  50 MCG/ACT nasal spray Commonly known as: FLONASE  Place 1 spray into both nostrils daily as needed for allergies or rhinitis.   hydrALAZINE  25 MG tablet Commonly known as: APRESOLINE  Take 3 tablets (75 mg total) by mouth 3 (three) times daily.   levothyroxine  50 MCG tablet Commonly known as: SYNTHROID  Take 50 mcg by mouth daily before breakfast.   LORazepam  0.5 MG tablet Commonly known as: ATIVAN  Take 1 tablet (0.5 mg total) by mouth 2 (two) times daily as needed for anxiety (shaking). What changed:  when to take this reasons to take this   magnesium  oxide 400 MG tablet Commonly known as:  MAG-OX Take 1 tablet (400 mg total) by mouth daily. What changed: when to take this   meclizine  12.5 MG tablet Commonly known as: ANTIVERT  Take 1 tablet (12.5 mg total) by mouth 2 (two) times daily as needed for dizziness.   metoprolol  succinate 50 MG 24 hr tablet Commonly known as: TOPROL -XL Take 50 mg by mouth 2 (two) times daily.   ondansetron  4 MG disintegrating tablet Commonly known as: ZOFRAN -ODT Take 1 tablet (4 mg total) by mouth every 8 (eight) hours as needed for nausea or vomiting.   pantoprazole  40 MG tablet Commonly known as: PROTONIX  Take 1 tablet (40 mg total) by mouth daily.   scopolamine  1 MG/3DAYS Commonly known as: TRANSDERM-SCOP Place 1 patch (1.5 mg total) onto the skin every 3 (three) days.   telmisartan  80 MG tablet Commonly known as: MICARDIS  Take 80 mg by mouth daily.   trolamine salicylate 10 % cream Commonly known as: ASPERCREME Apply 1 Application topically as needed for muscle pain.   VITAMIN D -3 PO Take  1 capsule by mouth at bedtime.        Follow-up Information     Glover [name], MD. Go to.   Specialty: Family Medicine Why: Patient need to schedule an appointment. Contact information: [address] [place] [address] [phone]         [facility] Follow up.   Specialty: Home Health Services Why: Agency will call to schedule appointment. Contact information: [address] [place] [address] [phone]                Discharge Exam: Fredricka Weights   06/25/24 0439  Weight: 71 kg   General exam: awake, alert, no acute distress HEENT: moist mucus membranes, hearing grossly normal  Respiratory system: CTAB, no wheezes, rales or rhonchi, normal respiratory effort. Cardiovascular system: normal S1/S2, RRR, no pedal edema.   Gastrointestinal system: soft, NT, ND, no HSM felt, +bowel sounds. Central nervous system: A&O x 2+. no gross focal neurologic deficits, normal  speech Extremities: moves all, no edema, normal tone Skin: dry, intact, normal temperature Psychiatry: normal mood, congruent affect, judgement and insight appear normal   Condition at discharge: stable  The results of significant diagnostics from this hospitalization (including imaging, microbiology, ancillary and laboratory) are listed below for reference.   Imaging Studies: CT CHEST ABDOMEN PELVIS W CONTRAST Result Date: 06/25/2024 CLINICAL DATA:  Vomiting and dizziness.  Left-sided pain.  Sepsis. EXAM: CT CHEST, ABDOMEN, AND PELVIS WITH CONTRAST TECHNIQUE: Multidetector CT imaging of the chest, abdomen and pelvis was performed following the standard protocol during bolus administration of intravenous contrast. RADIATION DOSE REDUCTION: This exam was performed according to the departmental dose-optimization program which includes automated exposure control, adjustment of the mA and/or kV according to patient size and/or use of iterative reconstruction technique. CONTRAST:  80mL OMNIPAQUE  IOHEXOL  300 MG/ML  SOLN COMPARISON:  CT abdomen and pelvis dated 06/12/2024, CTA chest dated 12/01/2021 FINDINGS: CT CHEST FINDINGS Cardiovascular: Normal heart size. No significant pericardial fluid/thickening. Great vessels are normal in course and caliber. No central pulmonary emboli. Coronary artery calcifications. Mediastinum/Nodes: Imaged thyroid  gland without nodules meeting criteria for imaging follow-up by size. Normal esophagus. No pathologically enlarged axillary, supraclavicular, mediastinal, or hilar lymph nodes. Lungs/Pleura: The central airways are patent. Patchy ground-glass opacity within the medial right lower lobe. No pneumothorax. No pleural effusion. Musculoskeletal: No acute or abnormal lytic or blastic osseous lesions. Multilevel degenerative changes of the thoracic spine. Left mastectomy. CT ABDOMEN PELVIS FINDINGS Hepatobiliary: Unchanged subcentimeter segment 2 hypodensity (2:48), too small  to characterize but likely a cyst. No intra or extrahepatic biliary ductal dilation. Cholecystectomy. Pancreas: No focal lesions or main ductal dilation. Spleen: Normal in size without focal abnormality. Adrenals/Urinary Tract: No adrenal nodules. No suspicious renal mass, calculi, or hydronephrosis. No focal bladder wall thickening. Stomach/Bowel: Normal appearance of the stomach. No evidence of bowel wall thickening, distention, or inflammatory changes. Colonic diverticulosis without acute diverticulitis. Appendix is not discretely seen. Vascular/Lymphatic: Aortic atherosclerosis. No enlarged abdominal or pelvic lymph nodes. Reproductive: No adnexal masses. Other: No free fluid, fluid collection, or free air. Musculoskeletal: No acute or abnormal lytic or blastic osseous findings. Multilevel degenerative changes of the lumbar spine. IMPRESSION: 1. Patchy ground-glass opacity within the medial right lower lobe, likely infectious/inflammatory. 2. No acute abdominopelvic findings. 3. Colonic diverticulosis without acute diverticulitis. 4.  Aortic Atherosclerosis (ICD10-I70.0). Electronically Signed   By: Limin  Xu M.D.   On: 06/25/2024 09:26   ECHOCARDIOGRAM COMPLETE Result Date: 06/15/2024  ECHOCARDIOGRAM REPORT   Patient Name:   Margaretmary A Broussard Date of Exam: 06/15/2024 Medical Rec #:  [id number]       Height:       66.0 in Accession #:    [id number]      Weight:       156.5 lb Date of Birth:  1946/12/06       BSA:          1.802 m Patient Age:    78 years        BP:           158/64 mmHg Patient Gender: F               HR:           82 bpm. Exam Location:  [facility] Procedure: 2D Echo, Cardiac Doppler and Color Doppler (Both Spectral and Color            Flow Doppler were utilized during procedure). Indications:     CHF  History:         Patient has prior history of Echocardiogram examinations, most                  recent 07/29/2021. TIA; Risk Factors:Hypertension and                  Dyslipidemia.  Sonographer:      Philomena Daring Referring Phys:  [id number] ANTHONY CHRISTELLA POUCH Diagnosing Phys: Redell Cave MD IMPRESSIONS  1. Left ventricular ejection fraction, by estimation, is 60 to 65%. The left ventricle has normal function. The left ventricle has no regional wall motion abnormalities. There is mild left ventricular hypertrophy. Left ventricular diastolic parameters are consistent with Grade I diastolic dysfunction (impaired relaxation).  2. Right ventricular systolic function is normal. The right ventricular size is normal.  3. The mitral valve is normal in structure. Mild mitral valve regurgitation.  4. The aortic valve is tricuspid. Aortic valve regurgitation is not visualized.  5. The inferior vena cava is normal in size with greater than 50% respiratory variability, suggesting right atrial pressure of 3 mmHg. FINDINGS  Left Ventricle: Left ventricular ejection fraction, by estimation, is 60 to 65%. The left ventricle has normal function. The left ventricle has no regional wall motion abnormalities. The left ventricular internal cavity size was normal in size. There is  mild left ventricular hypertrophy. Left ventricular diastolic parameters are consistent with Grade I diastolic dysfunction (impaired relaxation). Right Ventricle: The right ventricular size is normal. No increase in right ventricular wall thickness. Right ventricular systolic function is normal. Left Atrium: Left atrial size was normal in size. Right Atrium: Right atrial size was normal in size. Pericardium: There is no evidence of pericardial effusion. Mitral Valve: The mitral valve is normal in structure. Mild mitral valve regurgitation. Tricuspid Valve: The tricuspid valve is normal in structure. Tricuspid valve regurgitation is mild. Aortic Valve: The aortic valve is tricuspid. Aortic valve regurgitation is not visualized. Pulmonic Valve: The pulmonic valve was normal in structure. Pulmonic valve regurgitation is trivial. Aorta: The aortic root and  ascending aorta are structurally normal, with no evidence of dilitation. Venous: The inferior vena cava is normal in size with greater than 50% respiratory variability, suggesting right atrial pressure of 3 mmHg. IAS/Shunts: No atrial level shunt detected by color flow Doppler.  LEFT VENTRICLE PLAX 2D LVIDd:         3.49 cm     Diastology LVIDs:  2.19 cm     LV e' medial:    7.45 cm/s LV PW:         1.08 cm     LV E/e' medial:  11.3 LV IVS:        1.15 cm     LV e' lateral:   10.50 cm/s LVOT diam:     2.00 cm     LV E/e' lateral: 8.0 LV SV:         62 LV SV Index:   34 LVOT Area:     3.14 cm  LV Volumes (MOD) LV vol d, MOD A2C: 61.1 ml LV vol d, MOD A4C: 65.6 ml LV vol s, MOD A2C: 11.7 ml LV vol s, MOD A4C: 20.6 ml LV SV MOD A2C:     49.4 ml LV SV MOD A4C:     65.6 ml LV SV MOD BP:      47.3 ml IVC IVC diam: 1.45 cm LEFT ATRIUM             Index        RIGHT ATRIUM           Index LA Vol (A2C):   14.3 ml 7.94 ml/m   RA Area:     11.80 cm LA Vol (A4C):   30.8 ml 17.09 ml/m  RA Volume:   26.40 ml  14.65 ml/m LA Biplane Vol: 23.0 ml 12.76 ml/m  AORTIC VALVE LVOT Vmax:   89.40 cm/s LVOT Vmean:  66.000 cm/s LVOT VTI:    0.197 m  AORTA Ao Root diam: 2.30 cm Ao Asc diam:  3.10 cm MITRAL VALVE                TRICUSPID VALVE MV Area (PHT): 4.68 cm     TR Peak grad:   25.2 mmHg MV Decel Time: 162 msec     TR Vmax:        251.00 cm/s MV E velocity: 83.90 cm/s MV A velocity: 113.00 cm/s  SHUNTS MV E/A ratio:  0.74         Systemic VTI:  0.20 m                             Systemic Diam: 2.00 cm Redell Cave MD Electronically signed by Redell Cave MD Signature Date/Time: 06/15/2024/1:22:55 PM    Final    DG Abd Portable 1V Result Date: 06/13/2024 CLINICAL DATA:  Nausea and vomiting. EXAM: PORTABLE ABDOMEN - 1 VIEW COMPARISON:  Abdominal radiograph dated 07/29/2022. FINDINGS: No bowel dilatation or evidence of obstruction. No free air. Calculi. Right upper quadrant cholecystectomy clips. Osteopenia with  degenerative changes. No acute osseous pathology. IMPRESSION: Nonobstructive bowel gas pattern. Electronically Signed   By: Vanetta Chou M.D.   On: 06/13/2024 18:13   CT ABDOMEN PELVIS W CONTRAST Result Date: 06/12/2024 CLINICAL DATA:  Abdominal pain.  Sepsis. EXAM: CT ABDOMEN AND PELVIS WITH CONTRAST TECHNIQUE: Multidetector CT imaging of the abdomen and pelvis was performed using the standard protocol following bolus administration of intravenous contrast. RADIATION DOSE REDUCTION: This exam was performed according to the departmental dose-optimization program which includes automated exposure control, adjustment of the mA and/or kV according to patient size and/or use of iterative reconstruction technique. CONTRAST:  80mL OMNIPAQUE  IOHEXOL  300 MG/ML  SOLN COMPARISON:  05/24/2024 FINDINGS: Lower chest: Prior left mastectomy. No acute findings. Coronary artery and aortic atherosclerosis. Hepatobiliary: No focal liver abnormality is seen. Status  post cholecystectomy. No biliary dilatation. Pancreas: No focal abnormality or ductal dilatation. Spleen: No focal abnormality.  Normal size. Adrenals/Urinary Tract: No adrenal abnormality. No focal renal abnormality. No stones or hydronephrosis. Urinary bladder is unremarkable. Stomach/Bowel: Left colonic diverticulosis. No active diverticulitis. Stomach and small bowel decompressed. No bowel obstruction or inflammatory process. Vascular/Lymphatic: Aortic atherosclerosis. No evidence of aneurysm or adenopathy. Reproductive: Prior hysterectomy.  No adnexal masses. Other: No free fluid or free air. Musculoskeletal: No acute bony abnormality. IMPRESSION: Left colonic diverticulosis.  No active diverticulitis. Aortic atherosclerosis. No acute findings. Electronically Signed   By: Franky Crease M.D.   On: 06/12/2024 19:34   CT Head Wo Contrast Result Date: 06/12/2024 CLINICAL DATA:  Headache EXAM: CT HEAD WITHOUT CONTRAST TECHNIQUE: Contiguous axial images were obtained  from the base of the skull through the vertex without intravenous contrast. RADIATION DOSE REDUCTION: This exam was performed according to the departmental dose-optimization program which includes automated exposure control, adjustment of the mA and/or kV according to patient size and/or use of iterative reconstruction technique. COMPARISON:  04/29/2024 FINDINGS: Brain: Old right caudate head lacunar infarct, stable. There is atrophy and chronic small vessel disease changes. No acute intracranial abnormality. Specifically, no hemorrhage, hydrocephalus, mass lesion, acute infarction, or significant intracranial injury. Vascular: No hyperdense vessel or unexpected calcification. Skull: No acute calvarial abnormality. Sinuses/Orbits: No acute findings Other: None IMPRESSION: Atrophy, chronic microvascular disease. No acute intracranial abnormality. Electronically Signed   By: Franky Crease M.D.   On: 06/12/2024 19:32   DG Chest Port 1 View Result Date: 06/12/2024 CLINICAL DATA:  [id number] Sepsis [facility]) [id number] EXAM: PORTABLE CHEST - 1 VIEW COMPARISON:  May 24, 2024 FINDINGS: Lower lung volumes. No focal airspace consolidation, pleural effusion, or pneumothorax. No cardiomegaly. Tortuous aorta with aortic atherosclerosis. No acute fracture or destructive lesions. Multilevel thoracic osteophytosis. Bilateral AC joint osteoarthritis. IMPRESSION: No acute cardiopulmonary abnormality. Electronically Signed   By: Rogelia Myers M.D.   On: 06/12/2024 17:08    Microbiology: Results for orders placed or performed during the hospital encounter of 06/25/24  Blood Culture (routine x 2)     Status: None   Collection Time: 06/25/24  8:34 AM   Specimen: BLOOD  Result Value Ref Range Status   Specimen Description BLOOD RIGHT ANTECUBITAL  Final   Special Requests   Final    BOTTLES DRAWN AEROBIC AND ANAEROBIC Blood Culture adequate volume   Culture   Final    NO GROWTH 5 DAYS Performed at [facility], [address], [place], [place] [address]    Report Status 06/30/2024 FINAL  Final  Blood Culture (routine x 2)     Status: None   Collection Time: 06/25/24  8:41 AM   Specimen: BLOOD  Result Value Ref Range Status   Specimen Description BLOOD RIGHT ANTECUBITAL  Final   Special Requests   Final    BOTTLES DRAWN AEROBIC AND ANAEROBIC Blood Culture adequate volume   Culture   Final    NO GROWTH 5 DAYS Performed at [facility], [address] [place], [place] [address]    Report Status 06/30/2024 FINAL  Final  MRSA Next Gen by PCR, Nasal     Status: None   Collection Time: 06/25/24 12:50 PM   Specimen: Nasal Mucosa; Nasal Swab  Result Value Ref Range Status   MRSA by PCR Next Gen NOT DETECTED NOT DETECTED Final    Comment: (NOTE) The GeneXpert MRSA Assay (FDA approved for NASAL specimens only), is one  component of a comprehensive MRSA colonization surveillance program. It is not intended to diagnose MRSA infection nor to guide or monitor treatment for MRSA infections. Test performance is not FDA approved in patients less than 26 years old. Performed at [facility], [address] [place], [place] [address]     Labs: CBC: Recent Labs  Lab 06/28/24 0511 06/29/24 0449  WBC 12.1* 11.5*  HGB 9.6* 9.9*  HCT 28.0* 28.9*  MCV 91.8 90.9  PLT 230 251   Basic Metabolic Panel: Recent Labs  Lab 06/25/24 1600 06/26/24 0540 06/27/24 1716 06/28/24 0511 06/29/24 0449  NA 131* 133*  --  135 137  K  --  2.9* 4.2 3.9 3.7  CL  --  100  --  104 102  CO2  --  23  --  23 27  GLUCOSE  --  87  --  82 103*  BUN  --  6*  --  8 15  CREATININE  --  0.93  --  0.96 1.09*  CALCIUM   --  8.7*  --  8.9 9.1  MG  --   --  1.6*  --  1.7   Liver Function Tests: No results for input(s): AST, ALT, ALKPHOS, BILITOT, PROT, ALBUMIN in the last 168 hours.  CBG: No results for input(s): GLUCAP in the last 168 hours.  Discharge time spent: less than 30  minutes.  Signed: Burnard DELENA Cunning, DO [facility] 07/02/2024

## 2024-06-29 NOTE — Care Management Important Message (Signed)
 Important Message  Patient Details  Name: Cindy Huerta MRN: [id number] Date of Birth: 03/17/1946   Important Message Given:  Yes - Medicare IM     Kailea Dannemiller W, CMA 06/29/2024, 11:27 AM

## 2024-06-29 NOTE — TOC Initial Note (Signed)
 Transition of Care [facility]) - Initial/Assessment Note    Patient Details  Name: Cindy Huerta MRN: [id number] Date of Birth: February 04, 1946  Transition of Care [facility]) CM/SW Contact:    Dalia GORMAN Fuse, RN Phone Number: 06/29/2024, 9:19 AM  Clinical Narrative:                  TOC spoke with the patient in her room. The patient requested that TOC reach out to her son.      TOC spoke with the patient's son Marcey. The patient is active with [facility].  TOC sent request to resume HH PT/OT/RN to [place]  with [facility].  Patient Goals and CMS Choice            Expected Discharge Plan and Services                                              Prior Living Arrangements/Services                       Activities of Daily Living   ADL Screening (condition at time of admission) Independently performs ADLs?: No Does the patient have a NEW difficulty with bathing/dressing/toileting/self-feeding that is expected to last >3 days?: Yes (Initiates electronic notice to provider for possible OT consult) Does the patient have a NEW difficulty with getting in/out of bed, walking, or climbing stairs that is expected to last >3 days?: Yes (Initiates electronic notice to provider for possible PT consult) Does the patient have a NEW difficulty with communication that is expected to last >3 days?: No Is the patient deaf or have difficulty hearing?: Yes Does the patient have difficulty seeing, even when wearing glasses/contacts?: Yes Does the patient have difficulty concentrating, remembering, or making decisions?: Yes  Permission Sought/Granted                  Emotional Assessment              Admission diagnosis:  Hyponatremia [E87.1] AKI (acute kidney injury) (HCC) [N17.9] Acute kidney injury (HCC) [N17.9] Severe sepsis (HCC) [A41.9, R65.20] Intractable vomiting [R11.10] Patient Active Problem List   Diagnosis Date Noted   Intractable vomiting 06/26/2024    Cyclical vomiting with nausea 06/12/2024   Headache 06/12/2024   Cyclical vomiting 06/12/2024   Lactic acidosis 05/25/2024   Hypochloremia 05/25/2024   Dementia (HCC) 05/25/2024   Nausea and vomiting 04/29/2024   Sepsis (HCC) 09/14/2023   Acute kidney injury superimposed on chronic kidney disease (HCC) 09/13/2023   Leukocytosis 09/13/2023   Abnormal TSH 09/13/2023   AKI (acute kidney injury) (HCC) 08/09/2023   Syncope 08/09/2023   Hypomagnesemia 08/09/2023   QT prolongation 08/09/2023   Acute metabolic encephalopathy 08/09/2023   Elevated troponin 08/09/2023   Iron  deficiency anemia 08/17/2022   Elevated lactic acid level 08/17/2022   UTI (urinary tract infection) 08/17/2022   Fall at home, initial encounter 08/17/2022   Abnormal LFTs 08/17/2022   Dehydration 07/27/2022   Malnutrition of moderate degree 06/30/2022   Acute urinary retention 06/29/2022   Acute renal failure superimposed on stage 3b chronic kidney disease (HCC) 06/28/2022   DNR (do not resuscitate) 06/28/2022   Hyponatremia 06/18/2022   Hypokalemia 06/18/2022   Chronic diastolic CHF (congestive heart failure) (HCC) 06/18/2022   Dysuria 06/18/2022   HTN (hypertension) 11/24/2021   Normocytic anemia 11/24/2021   Vertigo  11/24/2021   Breast CA (HCC) 11/22/2021   Breast cancer (HCC) 11/20/2021   Intractable nausea and vomiting 10/09/2021   Weakness 10/07/2021   Carcinoma of overlapping sites of left breast in female, estrogen receptor positive (HCC) 10/06/2021   Dizziness 10/06/2021   Rectal bleeding 10/06/2021   GIB (gastrointestinal bleeding) 10/06/2021   Left-sided weakness 07/29/2021   HLD (hyperlipidemia) 07/29/2021   Hypothyroidism 07/29/2021   Left sided numbness 07/29/2021   Depression with anxiety    CKD (chronic kidney disease), stage IIIa    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   Epigastric pain 04/23/2021   GERD (gastroesophageal reflux disease) 04/23/2021   Benign essential hypertension 01/20/2021    History of breast cancer 01/20/2021   History of TIA (transient ischemic attack) 01/20/2021   Palpitations 01/20/2021   Malignant neoplasm of 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) 12/06/2018   PCP:  Glover [name], MD Pharmacy:   [facility] [phone] - [address], [place] - [address] [place] [address] Phone: [phone] Fax: [phone]  [facility] [address] [place] [address] Phone: [phone] Fax: [phone]     Social Drivers of Health (SDOH) Social History: SDOH Screenings   Food Insecurity: No Food Insecurity (06/25/2024)  Housing: Low Risk  (06/25/2024)  Transportation Needs: No Transportation Needs (06/25/2024)  Utilities: Not At Risk (06/25/2024)  Financial Resource Strain: Patient Declined (12/29/2022)   Received from [facility]  Social Connections: Patient Declined (06/25/2024)  Recent Concern: Social Connections - Moderately Isolated (06/13/2024)  Tobacco Use: Low Risk  (06/27/2024)   SDOH Interventions:     Readmission Risk Interventions    06/15/2024   12:42 PM 05/01/2024   12:59 PM 09/13/2023   10:08 AM  Readmission Risk Prevention Plan  Transportation Screening Complete  Complete  PCP or Specialist Appt within 3-5 Days   Complete  HRI or Home Care Consult   Complete  Social Work Consult for Recovery Care Planning/Counseling   Complete  Palliative Care Screening   Not Applicable  Medication Review Oceanographer)  Complete Not Complete  Med Review Comments   Patient's son will speak with nurses upon discharge  PCP or Specialist appointment within 3-5 days of discharge Complete Complete   HRI or Home Care Consult Complete Complete   SW Recovery Care/Counseling Consult  Complete   Palliative Care Screening Not Applicable Not Applicable   Skilled Nursing Facility Complete Patient Refused

## 2024-06-29 NOTE — TOC Transition Note (Signed)
 Transition of Care [facility]) - Discharge Note   Patient Details  Name: Cindy Huerta MRN: [id number] Date of Birth: 01-09-1946  Transition of Care [facility]) CM/SW Contact:  Dalia GORMAN Fuse, RN Phone Number: 06/29/2024, 1:55 PM   Clinical Narrative:     Patient is medically clear for discharge to home with home health. TOC sent request to resume HH to [place]  with [facility]. No other TOC needs identified.   Final next level of care: Home w Home Health Services Barriers to Discharge: Barriers Resolved   Patient Goals and CMS Choice     Choice offered to / list presented to : Adult Children      Discharge Placement                       Discharge Plan and Services Additional resources added to the After Visit Summary for                            [facility] Arranged: PT, OT, RN [facility] Agency: Other - See comment [place]) Date HH Agency Contacted: 06/29/24 Time HH Agency Contacted: 1355 Representative spoke with at [facility] Agency: [place]   Social Drivers of Health (SDOH) Interventions SDOH Screenings   Food Insecurity: No Food Insecurity (06/25/2024)  Housing: Low Risk  (06/25/2024)  Transportation Needs: No Transportation Needs (06/25/2024)  Utilities: Not At Risk (06/25/2024)  Financial Resource Strain: Patient Declined (12/29/2022)   Received from [facility]  Social Connections: Patient Declined (06/25/2024)  Recent Concern: Social Connections - Moderately Isolated (06/13/2024)  Tobacco Use: Low Risk  (06/27/2024)     Readmission Risk Interventions    06/15/2024   12:42 PM 05/01/2024   12:59 PM 09/13/2023   10:08 AM  Readmission Risk Prevention Plan  Transportation Screening Complete  Complete  PCP or Specialist Appt within 3-5 Days   Complete  HRI or Home Care Consult   Complete  Social Work Consult for Recovery Care Planning/Counseling   Complete  Palliative Care Screening   Not Applicable  Medication Review Oceanographer)  Complete Not  Complete  Med Review Comments   Patient's son will speak with nurses upon discharge  PCP or Specialist appointment within 3-5 days of discharge Complete Complete   HRI or Home Care Consult Complete Complete   SW Recovery Care/Counseling Consult  Complete   Palliative Care Screening Not Applicable Not Applicable   Skilled Nursing Facility Complete Patient Refused

## 2024-06-30 LAB — CULTURE, BLOOD (ROUTINE X 2)
Culture: NO GROWTH
Culture: NO GROWTH
Special Requests: ADEQUATE
Special Requests: ADEQUATE

## 2024-07-02 ENCOUNTER — Encounter: Payer: Self-pay | Admitting: Internal Medicine

## 2024-07-05 ENCOUNTER — Emergency Department (HOSPITAL_COMMUNITY)

## 2024-07-05 ENCOUNTER — Other Ambulatory Visit: Payer: Self-pay

## 2024-07-05 ENCOUNTER — Inpatient Hospital Stay (HOSPITAL_COMMUNITY)
Admission: EM | Admit: 2024-07-05 | Discharge: 2024-07-09 | DRG: 690 | Disposition: A | Discharge status: Skilled Nursing Facility | Attending: Student | Admitting: Student

## 2024-07-05 ENCOUNTER — Encounter (HOSPITAL_COMMUNITY): Payer: Self-pay | Admitting: Internal Medicine

## 2024-07-05 DIAGNOSIS — C50812 Malignant neoplasm of overlapping sites of left female breast: Secondary | ICD-10-CM

## 2024-07-05 DIAGNOSIS — E860 Dehydration: Secondary | ICD-10-CM | POA: Diagnosis present

## 2024-07-05 DIAGNOSIS — Z881 Allergy status to other antibiotic agents status: Secondary | ICD-10-CM

## 2024-07-05 DIAGNOSIS — Z90710 Acquired absence of both cervix and uterus: Secondary | ICD-10-CM

## 2024-07-05 DIAGNOSIS — N1831 Chronic kidney disease, stage 3a: Secondary | ICD-10-CM

## 2024-07-05 DIAGNOSIS — F0393 Unspecified dementia, unspecified severity, with mood disturbance: Secondary | ICD-10-CM | POA: Diagnosis present

## 2024-07-05 DIAGNOSIS — N183 Chronic kidney disease, stage 3 unspecified: Secondary | ICD-10-CM | POA: Diagnosis present

## 2024-07-05 DIAGNOSIS — Z853 Personal history of malignant neoplasm of breast: Secondary | ICD-10-CM

## 2024-07-05 DIAGNOSIS — I10 Essential (primary) hypertension: Secondary | ICD-10-CM | POA: Diagnosis not present

## 2024-07-05 DIAGNOSIS — K219 Gastro-esophageal reflux disease without esophagitis: Secondary | ICD-10-CM | POA: Diagnosis present

## 2024-07-05 DIAGNOSIS — I130 Hypertensive heart and chronic kidney disease with heart failure and stage 1 through stage 4 chronic kidney disease, or unspecified chronic kidney disease: Secondary | ICD-10-CM | POA: Diagnosis present

## 2024-07-05 DIAGNOSIS — Z8719 Personal history of other diseases of the digestive system: Secondary | ICD-10-CM

## 2024-07-05 DIAGNOSIS — E785 Hyperlipidemia, unspecified: Secondary | ICD-10-CM | POA: Diagnosis present

## 2024-07-05 DIAGNOSIS — E0781 Sick-euthyroid syndrome: Secondary | ICD-10-CM | POA: Diagnosis present

## 2024-07-05 DIAGNOSIS — Z7982 Long term (current) use of aspirin: Secondary | ICD-10-CM

## 2024-07-05 DIAGNOSIS — N390 Urinary tract infection, site not specified: Principal | ICD-10-CM | POA: Diagnosis present

## 2024-07-05 DIAGNOSIS — G9341 Metabolic encephalopathy: Secondary | ICD-10-CM | POA: Diagnosis present

## 2024-07-05 DIAGNOSIS — R9431 Abnormal electrocardiogram [ECG] [EKG]: Secondary | ICD-10-CM | POA: Diagnosis present

## 2024-07-05 DIAGNOSIS — T465X5A Adverse effect of other antihypertensive drugs, initial encounter: Secondary | ICD-10-CM | POA: Diagnosis present

## 2024-07-05 DIAGNOSIS — F0390 Unspecified dementia without behavioral disturbance: Secondary | ICD-10-CM | POA: Diagnosis not present

## 2024-07-05 DIAGNOSIS — Z885 Allergy status to narcotic agent status: Secondary | ICD-10-CM

## 2024-07-05 DIAGNOSIS — I951 Orthostatic hypotension: Secondary | ICD-10-CM | POA: Diagnosis present

## 2024-07-05 DIAGNOSIS — Z79899 Other long term (current) drug therapy: Secondary | ICD-10-CM

## 2024-07-05 DIAGNOSIS — T447X5A Adverse effect of beta-adrenoreceptor antagonists, initial encounter: Secondary | ICD-10-CM | POA: Diagnosis present

## 2024-07-05 DIAGNOSIS — Z66 Do not resuscitate: Secondary | ICD-10-CM | POA: Diagnosis present

## 2024-07-05 DIAGNOSIS — Z8249 Family history of ischemic heart disease and other diseases of the circulatory system: Secondary | ICD-10-CM

## 2024-07-05 DIAGNOSIS — F418 Other specified anxiety disorders: Secondary | ICD-10-CM | POA: Diagnosis present

## 2024-07-05 DIAGNOSIS — I5032 Chronic diastolic (congestive) heart failure: Secondary | ICD-10-CM | POA: Diagnosis present

## 2024-07-05 DIAGNOSIS — N1832 Chronic kidney disease, stage 3b: Secondary | ICD-10-CM | POA: Diagnosis present

## 2024-07-05 DIAGNOSIS — Z888 Allergy status to other drugs, medicaments and biological substances status: Secondary | ICD-10-CM

## 2024-07-05 DIAGNOSIS — F0394 Unspecified dementia, unspecified severity, with anxiety: Secondary | ICD-10-CM | POA: Diagnosis present

## 2024-07-05 DIAGNOSIS — R112 Nausea with vomiting, unspecified: Secondary | ICD-10-CM | POA: Diagnosis present

## 2024-07-05 DIAGNOSIS — R531 Weakness: Principal | ICD-10-CM

## 2024-07-05 DIAGNOSIS — Z91040 Latex allergy status: Secondary | ICD-10-CM

## 2024-07-05 DIAGNOSIS — T502X5A Adverse effect of carbonic-anhydrase inhibitors, benzothiadiazides and other diuretics, initial encounter: Secondary | ICD-10-CM | POA: Diagnosis present

## 2024-07-05 DIAGNOSIS — Z9012 Acquired absence of left breast and nipple: Secondary | ICD-10-CM

## 2024-07-05 DIAGNOSIS — B9620 Unspecified Escherichia coli [E. coli] as the cause of diseases classified elsewhere: Secondary | ICD-10-CM | POA: Diagnosis present

## 2024-07-05 DIAGNOSIS — Z170 Estrogen receptor positive status [ER+]: Secondary | ICD-10-CM

## 2024-07-05 DIAGNOSIS — F32A Depression, unspecified: Secondary | ICD-10-CM | POA: Diagnosis present

## 2024-07-05 DIAGNOSIS — R1115 Cyclical vomiting syndrome unrelated to migraine: Secondary | ICD-10-CM | POA: Diagnosis present

## 2024-07-05 DIAGNOSIS — Z79890 Hormone replacement therapy (postmenopausal): Secondary | ICD-10-CM

## 2024-07-05 DIAGNOSIS — Z8673 Personal history of transient ischemic attack (TIA), and cerebral infarction without residual deficits: Secondary | ICD-10-CM

## 2024-07-05 DIAGNOSIS — E039 Hypothyroidism, unspecified: Secondary | ICD-10-CM | POA: Diagnosis present

## 2024-07-05 DIAGNOSIS — Z884 Allergy status to anesthetic agent status: Secondary | ICD-10-CM

## 2024-07-05 DIAGNOSIS — R001 Bradycardia, unspecified: Secondary | ICD-10-CM | POA: Diagnosis present

## 2024-07-05 LAB — URINALYSIS, ROUTINE W REFLEX MICROSCOPIC
Bilirubin Urine: NEGATIVE
Glucose, UA: NEGATIVE mg/dL
Hgb urine dipstick: NEGATIVE
Ketones, ur: NEGATIVE mg/dL
Nitrite: NEGATIVE
Protein, ur: NEGATIVE mg/dL
Specific Gravity, Urine: 1.004 — ABNORMAL LOW (ref 1.005–1.030)
pH: 8 (ref 5.0–8.0)

## 2024-07-05 LAB — COMPREHENSIVE METABOLIC PANEL WITH GFR
ALT: 14 U/L (ref 0–44)
AST: 28 U/L (ref 15–41)
Albumin: 3.3 g/dL — ABNORMAL LOW (ref 3.5–5.0)
Alkaline Phosphatase: 50 U/L (ref 38–126)
Anion gap: 10 (ref 5–15)
BUN: <5 mg/dL — ABNORMAL LOW (ref 8–23)
CO2: 23 mmol/L (ref 22–32)
Calcium: 9.4 mg/dL (ref 8.9–10.3)
Chloride: 100 mmol/L (ref 98–111)
Creatinine, Ser: 1.14 mg/dL — ABNORMAL HIGH (ref 0.44–1.00)
GFR, Estimated: 49 mL/min — ABNORMAL LOW (ref >60)
Glucose, Bld: 98 mg/dL (ref 70–99)
Potassium: 4 mmol/L (ref 3.5–5.1)
Sodium: 133 mmol/L — ABNORMAL LOW (ref 135–145)
Total Bilirubin: 0.5 mg/dL (ref 0.0–1.2)
Total Protein: 6.9 g/dL (ref 6.5–8.1)

## 2024-07-05 LAB — DIFFERENTIAL
Abs Immature Granulocytes: 0.06 K/uL (ref 0.00–0.07)
Basophils Absolute: 0 K/uL (ref 0.0–0.1)
Basophils Relative: 0 %
Eosinophils Absolute: 0.3 K/uL (ref 0.0–0.5)
Eosinophils Relative: 3 %
Immature Granulocytes: 1 %
Lymphocytes Relative: 10 %
Lymphs Abs: 1 K/uL (ref 0.7–4.0)
Monocytes Absolute: 1.2 K/uL — ABNORMAL HIGH (ref 0.1–1.0)
Monocytes Relative: 13 %
Neutro Abs: 7 K/uL (ref 1.7–7.7)
Neutrophils Relative %: 73 %

## 2024-07-05 LAB — I-STAT CHEM 8, ED
BUN: 4 mg/dL — ABNORMAL LOW (ref 8–23)
Calcium, Ion: 1.24 mmol/L (ref 1.15–1.40)
Chloride: 100 mmol/L (ref 98–111)
Creatinine, Ser: 1.1 mg/dL — ABNORMAL HIGH (ref 0.44–1.00)
Glucose, Bld: 98 mg/dL (ref 70–99)
HCT: 31 % — ABNORMAL LOW (ref 36.0–46.0)
Hemoglobin: 10.5 g/dL — ABNORMAL LOW (ref 12.0–15.0)
Potassium: 4.2 mmol/L (ref 3.5–5.1)
Sodium: 135 mmol/L (ref 135–145)
TCO2: 22 mmol/L (ref 22–32)

## 2024-07-05 LAB — CBC
HCT: 29.6 % — ABNORMAL LOW (ref 36.0–46.0)
Hemoglobin: 9.9 g/dL — ABNORMAL LOW (ref 12.0–15.0)
MCH: 30.8 pg (ref 26.0–34.0)
MCHC: 33.4 g/dL (ref 30.0–36.0)
MCV: 92.2 fL (ref 80.0–100.0)
Platelets: 254 K/uL (ref 150–400)
RBC: 3.21 MIL/uL — ABNORMAL LOW (ref 3.87–5.11)
RDW: 13.8 % (ref 11.5–15.5)
WBC: 9.6 K/uL (ref 4.0–10.5)
nRBC: 0 % (ref 0.0–0.2)

## 2024-07-05 LAB — PROTIME-INR
INR: 1.2 (ref 0.8–1.2)
Prothrombin Time: 16.2 s — ABNORMAL HIGH (ref 11.4–15.2)

## 2024-07-05 LAB — ETHANOL: Alcohol, Ethyl (B): <15 mg/dL (ref <15)

## 2024-07-05 LAB — TSH: TSH: 15.306 u[IU]/mL — ABNORMAL HIGH (ref 0.350–4.500)

## 2024-07-05 LAB — T4, FREE: Free T4: 1.25 ng/dL — ABNORMAL HIGH (ref 0.61–1.12)

## 2024-07-05 LAB — CBG MONITORING, ED: Glucose-Capillary: 112 mg/dL — ABNORMAL HIGH (ref 70–99)

## 2024-07-05 LAB — APTT: aPTT: 31 s (ref 24–36)

## 2024-07-05 MED ORDER — ONDANSETRON HCL 4 MG PO TABS: 4.0000 mg | ORAL_TABLET | Freq: Four times a day (QID) | ORAL | Status: DC | PRN

## 2024-07-05 MED ORDER — ENSURE PLUS HIGH PROTEIN PO LIQD
237.0000 mL | Freq: Two times a day (BID) | ORAL | Status: DC
  Administered 2024-07-05 – 2024-07-09 (×7): 237 mL via ORAL

## 2024-07-05 MED ORDER — SODIUM CHLORIDE 0.9% FLUSH
3.0000 mL | Freq: Once | INTRAVENOUS | Status: AC
  Administered 2024-07-05: 3 mL via INTRAVENOUS

## 2024-07-05 MED ORDER — FLUOXETINE HCL 20 MG PO CAPS
20.0000 mg | ORAL_CAPSULE | Freq: Every day | ORAL | Status: DC
  Administered 2024-07-06 – 2024-07-09 (×4): 20 mg via ORAL
  Filled 2024-07-05 (×4): qty 1

## 2024-07-05 MED ORDER — PANTOPRAZOLE SODIUM 40 MG PO TBEC
40.0000 mg | DELAYED_RELEASE_TABLET | Freq: Every day | ORAL | Status: DC
Start: 2024-07-06 — End: 2024-07-09
  Administered 2024-07-06 – 2024-07-09 (×4): 40 mg via ORAL
  Filled 2024-07-05 (×4): qty 1

## 2024-07-05 MED ORDER — SCOPOLAMINE 1 MG/3DAYS TD PT72
1.0000 | MEDICATED_PATCH | TRANSDERMAL | Status: DC
  Administered 2024-07-06 – 2024-07-09 (×2): 1.5 mg via TRANSDERMAL
  Filled 2024-07-05 (×3): qty 1

## 2024-07-05 MED ORDER — CLONIDINE HCL 0.1 MG PO TABS
0.1000 mg | ORAL_TABLET | Freq: Every day | ORAL | Status: DC
  Administered 2024-07-06 – 2024-07-07 (×2): 0.1 mg via ORAL
  Filled 2024-07-05 (×2): qty 1

## 2024-07-05 MED ORDER — ACETAMINOPHEN 325 MG PO TABS
650.0000 mg | ORAL_TABLET | Freq: Four times a day (QID) | ORAL | Status: DC | PRN
  Administered 2024-07-05 – 2024-07-08 (×6): 650 mg via ORAL
  Filled 2024-07-05 (×7): qty 2

## 2024-07-05 MED ORDER — POLYETHYLENE GLYCOL 3350 17 G PO PACK: 17.0000 g | PACK | Freq: Every day | ORAL | Status: DC | PRN

## 2024-07-05 MED ORDER — MECLIZINE HCL 25 MG PO TABS: 12.5000 mg | ORAL_TABLET | Freq: Two times a day (BID) | ORAL | Status: DC | PRN

## 2024-07-05 MED ORDER — SODIUM CHLORIDE 0.9 % IV BOLUS
1000.0000 mL | Freq: Once | INTRAVENOUS | Status: AC
  Administered 2024-07-05: 1000 mL via INTRAVENOUS

## 2024-07-05 MED ORDER — TRIAMTERENE-HCTZ 37.5-25 MG PO TABS
1.0000 | ORAL_TABLET | Freq: Every day | ORAL | Status: DC
  Administered 2024-07-06 – 2024-07-07 (×2): 1 via ORAL
  Filled 2024-07-05 (×2): qty 1

## 2024-07-05 MED ORDER — LORAZEPAM 0.5 MG PO TABS
0.5000 mg | ORAL_TABLET | Freq: Two times a day (BID) | ORAL | Status: DC | PRN
  Administered 2024-07-05: 0.5 mg via ORAL
  Filled 2024-07-05: qty 1

## 2024-07-05 MED ORDER — ONDANSETRON HCL 4 MG/2ML IJ SOLN
4.0000 mg | Freq: Four times a day (QID) | INTRAMUSCULAR | Status: DC | PRN
  Administered 2024-07-06: 4 mg via INTRAVENOUS
  Filled 2024-07-05: qty 2

## 2024-07-05 MED ORDER — SODIUM CHLORIDE 0.9% FLUSH
3.0000 mL | Freq: Two times a day (BID) | INTRAVENOUS | Status: DC
  Administered 2024-07-05 – 2024-07-08 (×6): 3 mL via INTRAVENOUS

## 2024-07-05 MED ORDER — LEVOTHYROXINE SODIUM 50 MCG PO TABS
50.0000 ug | ORAL_TABLET | Freq: Every day | ORAL | Status: DC
  Administered 2024-07-06 – 2024-07-09 (×4): 50 ug via ORAL
  Filled 2024-07-05 (×4): qty 1

## 2024-07-05 MED ORDER — ACETAMINOPHEN 325 MG PO TABS
650.0000 mg | ORAL_TABLET | Freq: Once | ORAL | Status: AC
  Administered 2024-07-05: 650 mg via ORAL
  Filled 2024-07-05: qty 2

## 2024-07-05 MED ORDER — IRBESARTAN 300 MG PO TABS
300.0000 mg | ORAL_TABLET | Freq: Every day | ORAL | Status: DC
  Administered 2024-07-06 – 2024-07-09 (×4): 300 mg via ORAL
  Filled 2024-07-05 (×4): qty 1

## 2024-07-05 MED ORDER — ASPIRIN 81 MG PO TBEC
81.0000 mg | DELAYED_RELEASE_TABLET | Freq: Every day | ORAL | Status: DC
  Administered 2024-07-05 – 2024-07-08 (×4): 81 mg via ORAL
  Filled 2024-07-05 (×4): qty 1

## 2024-07-05 MED ORDER — POLYVINYL ALCOHOL 1.4 % OP SOLN: 1.0000 [drp] | Freq: Every day | OPHTHALMIC | Status: DC | PRN

## 2024-07-05 MED ORDER — ACETAMINOPHEN 650 MG RE SUPP: 650.0000 mg | Freq: Four times a day (QID) | RECTAL | Status: DC | PRN

## 2024-07-05 MED ORDER — ENOXAPARIN SODIUM 40 MG/0.4ML IJ SOSY
40.0000 mg | PREFILLED_SYRINGE | INTRAMUSCULAR | Status: DC
  Administered 2024-07-05 – 2024-07-08 (×4): 40 mg via SUBCUTANEOUS
  Filled 2024-07-05 (×4): qty 0.4

## 2024-07-05 MED ORDER — ATORVASTATIN CALCIUM 80 MG PO TABS
80.0000 mg | ORAL_TABLET | Freq: Every day | ORAL | Status: DC
  Administered 2024-07-06 – 2024-07-09 (×4): 80 mg via ORAL
  Filled 2024-07-05 (×4): qty 1

## 2024-07-05 NOTE — ED Notes (Addendum)
 ED PA at bedside

## 2024-07-05 NOTE — ED Notes (Signed)
Phlebotomy asked to attempt blood draw. 

## 2024-07-05 NOTE — Anesthesia Postprocedure Evaluation (Signed)
 Anesthesia Post Note  Patient: Cindy Huerta  Procedure(s) Performed: EGD (ESOPHAGOGASTRODUODENOSCOPY)  Patient location during evaluation: PACU Anesthesia Type: General Level of consciousness: awake and awake and alert Pain management: pain level controlled Vital Signs Assessment: post-procedure vital signs reviewed and stable Respiratory status: spontaneous breathing Cardiovascular status: stable Anesthetic complications: no   No notable events documented.   Last Vitals:  Vitals:   06/29/24 0438 06/29/24 0724  BP: (!) 128/57 (!) 144/65  Pulse: 83 83  Resp: 17 17  Temp: 37.2 C 37.3 C  SpO2: 96% 94%    Last Pain:  Vitals:   06/29/24 0930  TempSrc:   PainSc: 0-No pain                 VAN STAVEREN,Garritt Molyneux

## 2024-07-05 NOTE — Evaluation (Addendum)
 Physical Therapy Evaluation Patient Details Name: Cindy Huerta MRN: [id number] DOB: 1946/01/11 Today's Date: 07/05/2024  History of Present Illness  Pt is a 78 y.o. F who presents 07/05/2024 with dizziness and weakness. CT head negative for acute abnormality. Significant PMH: hypertension, hyperlipidemia, TIA, GI bleed, GERD, cervical vomiting, CKD 3A, CHF, dementia, DNR, depression, anxiety, hypothyroidism, breast cancer.  Clinical Impression  Pt recently discharged from [facility] home. Pt able to progress to edge of stretcher without physical assist. BP supine 166/65 (95), sitting 131/70. Pt reports sensation of falling when upright, shaking, and nausea. Pt repeating, please help me, over and over although has difficulty accurately describing symptoms. Returned to supine, where pt requested to urinate and assisted onto bed pan with NT. Pt unable to participate in vestibular assessment. Patient will benefit from continued inpatient follow up therapy, <3 hours/day. Will continue to progress as tolerated.      If plan is discharge home, recommend the following: Two people to help with walking and/or transfers;Two people to help with bathing/dressing/bathroom   Can travel by private vehicle   No    Equipment Recommendations Wheelchair (measurements PT);Wheelchair cushion (measurements PT)  Recommendations for Other Services       Functional Status Assessment Patient has had a recent decline in their functional status and demonstrates the ability to make significant improvements in function in a reasonable and predictable amount of time.     Precautions / Restrictions Precautions Precautions: Fall Recall of Precautions/Restrictions: Impaired Restrictions Weight Bearing Restrictions Per Provider Order: No      Mobility  Bed Mobility Overal bed mobility: Needs Assistance Bed Mobility: Supine to Sit, Sit to Supine     Supine to sit: Contact guard Sit to supine: Mod assist         Transfers                   General transfer comment: unable    Ambulation/Gait                  Stairs            Wheelchair Mobility     Tilt Bed    Modified Rankin (Stroke Patients Only)       Balance Overall balance assessment: Needs assistance Sitting-balance support: Feet unsupported Sitting balance-Leahy Scale: Poor Sitting balance - Comments: CGA                                     Pertinent Vitals/Pain Pain Assessment Pain Assessment: No/denies pain    Home Living Family/patient expects to be discharged to:: Private residence Living Arrangements: Children Available Help at Discharge: Family;Available 24 hours/day Type of Home: House Home Access: Stairs to enter Entrance Stairs-Rails: None Entrance Stairs-Number of Steps: 2 Alternate Level Stairs-Number of Steps: flight Home Layout: Two level;Bed/bath upstairs Home Equipment: Shower seat - built in;BSC/3in1;Cane - quad;Cane - single point;Rolling Walker (2 wheels);Rollator (4 wheels) Additional Comments: Son works from home    Prior Function Prior Level of Function : Independent/Modified Independent;Patient poor historian/Family not available             Mobility Comments: pt reports she amb with rollator at night, RW during the day ADLs Comments: pt reports she requires MOD I for ADL, supervision for showering from DIL; assist for IADL from son/DIL     Extremity/Trunk Assessment   Upper Extremity Assessment Upper Extremity Assessment: Generalized weakness  Lower Extremity Assessment Lower Extremity Assessment: Generalized weakness    Cervical / Trunk Assessment Cervical / Trunk Assessment: Kyphotic  Communication   Communication Communication: No apparent difficulties    Cognition Arousal: Alert Behavior During Therapy: Anxious   PT - Cognitive impairments: History of cognitive impairments                         Following  commands: Intact       Cueing Cueing Techniques: Verbal cues, Gestural cues     General Comments      Exercises     Assessment/Plan    PT Assessment Patient needs continued PT services  PT Problem List Decreased activity tolerance;Decreased balance;Decreased mobility;Decreased knowledge of use of DME       PT Treatment Interventions DME instruction;Gait training;Stair training;Patient/family education;Neuromuscular re-education;Functional mobility training;Therapeutic activities;Therapeutic exercise;Balance training;Canalith reposition    PT Goals (Current goals can be found in the Care Plan section)  Acute Rehab PT Goals Patient Stated Goal: to feel better PT Goal Formulation: With patient Time For Goal Achievement: 07/19/24 Potential to Achieve Goals: Fair    Frequency Min 2X/week     Co-evaluation               AM-PAC PT 6 Clicks Mobility  Outcome Measure Help needed turning from your back to your side while in a flat bed without using bedrails?: A Little Help needed moving from lying on your back to sitting on the side of a flat bed without using bedrails?: A Little Help needed moving to and from a bed to a chair (including a wheelchair)?: Total Help needed standing up from a chair using your arms (e.g., wheelchair or bedside chair)?: Total Help needed to walk in hospital room?: Total Help needed climbing 3-5 steps with a railing? : Total 6 Click Score: 10    End of Session   Activity Tolerance: Other (comment) (limited by dizziness) Patient left: in bed;with call bell/phone within reach Nurse Communication: Mobility status PT Visit Diagnosis: Muscle weakness (generalized) (M62.81);Other abnormalities of gait and mobility (R26.89);Dizziness and giddiness (R42)    Time: 8482-8464 PT Time Calculation (min) (ACUTE ONLY): 18 min   Charges:   PT Evaluation $PT Eval Low Complexity: 1 Low   PT General Charges $$ ACUTE PT VISIT: 1 Visit          Aleck Daring, PT, DPT Acute Rehabilitation Services Office [phone]   [name] 07/05/2024, 4:30 PM

## 2024-07-05 NOTE — ED Notes (Signed)
 CCMD called.

## 2024-07-05 NOTE — ED Provider Notes (Signed)
 [place] EMERGENCY DEPARTMENT AT [facility] Provider Note   CSN: [id number] Arrival date & time: 07/05/24  9057     Patient presents with: Weakness, Dizziness, imbalance, and Abdominal Pain   Cindy Huerta is a 78 y.o. female patient with history of dementia, hyperlipidemia, hypertension who presents to the emergency department today with generalized weakness and dizziness.  Symptoms been present for several days per the patient.  Per chart review, patient was recently discharged on 06/29/2024 after being admitted for the second time secondary to intractable vomiting with subsequent AKI.  Upon discharge, the son who is at the bedside states that she is not returned back to baseline.  She has been more weak than normal and less talkative.  She has been eating and drinking normally.  She also endorses some dysuria.  She denies any fever, chills, cough, congestion.  She does endorse abdominal pain which is not a new problem.  Chart review does reveal that the patient had 2 CT scans this month which were both negative.    Weakness Associated symptoms: abdominal pain and dizziness   Dizziness Associated symptoms: weakness   Abdominal Pain      Prior to Admission medications   Medication Sig Start Date End Date Taking? Authorizing Provider  acetaminophen  (TYLENOL ) 500 MG tablet Take 500-1,000 mg by mouth daily as needed for moderate pain (pain score 4-6) or mild pain (pain score 1-3).   Yes [provider]  amLODipine  (NORVASC ) 10 MG tablet Take 1 tablet (10 mg total) by mouth daily. 08/25/22  Yes Amin, Ankit C, MD  ARTIFICIAL TEARS 0.1-0.3 % SOLN Place 1 drop into both eyes daily as needed for dry eyes. 07/15/22  Yes [provider]  aspirin  81 MG EC tablet Take 1 tablet (81 mg total) by mouth daily. RESTART 48HRS AFTER DISCHARGE Patient taking differently: Take 81 mg by mouth at bedtime. 11/25/21  Yes Sakai, Isami, DO  atorvastatin  (LIPITOR ) 80 MG tablet Take  80 mg by mouth daily. 09/21/21  Yes [provider]  Cholecalciferol  (VITAMIN D -3 PO) Take 1 capsule by mouth at bedtime.   Yes [provider]  cloNIDine  (CATAPRES ) 0.1 MG tablet Take 0.1 mg by mouth 2 (two) times daily.   Yes [provider]  feeding supplement (ENSURE PLUS HIGH PROTEIN) LIQD Take 237 mLs by mouth 2 (two) times daily between meals. 06/29/24  Yes Fausto Sor A, DO  ferrous sulfate  325 (65 FE) MG EC tablet Take 325 mg by mouth at bedtime.   Yes [provider]  FLUoxetine  (PROZAC ) 20 MG capsule Take 20 mg by mouth daily.   Yes [provider]  fluticasone  (FLONASE ) 50 MCG/ACT nasal spray Place 1 spray into both nostrils daily as needed for allergies or rhinitis.   Yes [provider]  hydrALAZINE  (APRESOLINE ) 25 MG tablet Take 3 tablets (75 mg total) by mouth 3 (three) times daily. 05/28/24 07/05/24 Yes [name], MD  levothyroxine  (SYNTHROID ) 50 MCG tablet Take 50 mcg by mouth daily before breakfast. 02/29/24 02/28/25 Yes [provider]  LORazepam  (ATIVAN ) 0.5 MG tablet Take 1 tablet (0.5 mg total) by mouth 2 (two) times daily as needed for anxiety (shaking). 06/29/24  Yes Fausto Sor A, DO  magnesium  oxide (MAG-OX) 400 MG tablet Take 1 tablet (400 mg total) by mouth daily. Patient taking differently: Take 400 mg by mouth at bedtime. 07/30/22  Yes Tobie Yetta HERO, MD  meclizine  (ANTIVERT ) 12.5 MG tablet Take 1 tablet (12.5 mg  total) by mouth 2 (two) times daily as needed for dizziness. 05/28/24  Yes [name], MD  metoprolol  succinate (TOPROL -XL) 50 MG 24 hr tablet Take 50 mg by mouth 2 (two) times daily. 03/18/21  Yes [provider]  ondansetron  (ZOFRAN -ODT) 4 MG disintegrating tablet Take 1 tablet (4 mg total) by mouth every 8 (eight) hours as needed for nausea or vomiting. 06/29/24  Yes Fausto Sor A, DO  pantoprazole  (PROTONIX ) 40 MG tablet Take 1 tablet (40 mg total) by mouth daily. 10/10/21  07/05/24 Yes Sreenath, Sudheer B, MD  scopolamine  (TRANSDERM-SCOP) 1 MG/3DAYS Place 1 patch (1.5 mg total) onto the skin every 3 (three) days. 06/17/24 07/17/24 Yes Trudy Anthony HERO, MD  telmisartan  (MICARDIS ) 80 MG tablet Take 80 mg by mouth daily. 05/06/24  Yes [provider]  triamterene -hydrochlorothiazide (MAXZIDE-25) 37.5-25 MG tablet Take 1 tablet by mouth daily. 06/30/24  Yes [provider]  trolamine salicylate (ASPERCREME) 10 % cream Apply 1 Application topically as needed for muscle pain.   Yes [provider]    Allergies: Latex, Novocain [procaine], Zestril [lisinopril], Benadryl  [diphenhydramine ], Biaxin [clarithromycin], and Roxicodone [oxycodone]    Review of Systems  Gastrointestinal:  Positive for abdominal pain.  Neurological:  Positive for dizziness and weakness.  All other systems reviewed and are negative.   Updated Vital Signs BP 131/70   Pulse (!) 56   Temp 97.7 F (36.5 C) (Oral)   Resp 17   SpO2 100%   Physical Exam Vitals and nursing note reviewed.  Constitutional:      General: She is not in acute distress.    Appearance: Normal appearance.  HENT:     Head: Normocephalic and atraumatic.  Eyes:     General:        Right eye: No discharge.        Left eye: No discharge.  Cardiovascular:     Rate and Rhythm: Regular rhythm. Bradycardia present.     Comments: S1/S2 are distinct without any evidence of murmur, rubs, or gallops.  Radial pulses are 2+ bilaterally.  Dorsalis pedis pulses are 2+ bilaterally.  No evidence of pedal edema. Pulmonary:     Comments: Clear to auscultation bilaterally.  Normal effort.  No respiratory distress.  No evidence of wheezes, rales, or rhonchi heard throughout. Abdominal:     General: Abdomen is flat. Bowel sounds are normal. There is no distension.     Tenderness: There is no abdominal tenderness. There is no guarding or rebound.  Musculoskeletal:        General: Normal range of motion.      Cervical back: Neck supple.  Skin:    General: Skin is warm and dry.     Findings: No rash.  Neurological:     General: No focal deficit present.     Mental Status: She is alert.     Comments: Patient very slow to respond and speaks softly.  Cranial nerves II through XII are intact.  No obvious facial droop or slurred speech.  Equal strength in the upper and lower extremities.  Does appear overall generally weak.  Normal sensation to the upper and lower extremities.  Patient does have some dysmetria with finger-to-nose.  Psychiatric:        Mood and Affect: Mood normal.        Behavior: Behavior normal.     (all labs ordered are listed, but only abnormal results are displayed) Labs Reviewed  PROTIME-INR - Abnormal; Notable for the following components:  Result Value   Prothrombin Time 16.2 (*)    All other components within normal limits  CBC - Abnormal; Notable for the following components:   RBC 3.21 (*)    Hemoglobin 9.9 (*)    HCT 29.6 (*)    All other components within normal limits  DIFFERENTIAL - Abnormal; Notable for the following components:   Monocytes Absolute 1.2 (*)    All other components within normal limits  COMPREHENSIVE METABOLIC PANEL WITH GFR - Abnormal; Notable for the following components:   Sodium 133 (*)    BUN <5 (*)    Creatinine, Ser 1.14 (*)    Albumin 3.3 (*)    GFR, Estimated 49 (*)    All other components within normal limits  URINALYSIS, ROUTINE W REFLEX MICROSCOPIC - Abnormal; Notable for the following components:   Color, Urine STRAW (*)    Specific Gravity, Urine 1.004 (*)    Leukocytes,Ua SMALL (*)    Bacteria, UA RARE (*)    All other components within normal limits  TSH - Abnormal; Notable for the following components:   TSH 15.306 (*)    All other components within normal limits  I-STAT CHEM 8, ED - Abnormal; Notable for the following components:   BUN 4 (*)    Creatinine, Ser 1.10 (*)    Hemoglobin 10.5 (*)    HCT 31.0 (*)     All other components within normal limits  CBG MONITORING, ED - Abnormal; Notable for the following components:   Glucose-Capillary 112 (*)    All other components within normal limits  URINE CULTURE  APTT  ETHANOL  T4, FREE    EKG: EKG Interpretation Date/Time:  Thursday July 05 2024 09:54:22 EDT Ventricular Rate:  60 PR Interval:  170 QRS Duration:  74 QT Interval:  446 QTC Calculation: 446 R Axis:   -21  Text Interpretation: Normal sinus rhythm Cannot rule out Anterior infarct , age undetermined Abnormal ECG When compared with ECG of 26-Jun-2024 04:43, PREVIOUS ECG IS PRESENT since last tracing no significant change Confirmed by Lenor Hollering [phone]) on 07/05/2024 12:24:41 PM  Radiology: CT HEAD WO CONTRAST Result Date: 07/05/2024 CLINICAL DATA:  Provided history: Stroke, follow-up. Syncope/presyncope, cerebrovascular cause suspected. EXAM: CT HEAD WITHOUT CONTRAST TECHNIQUE: Contiguous axial images were obtained from the base of the skull through the vertex without intravenous contrast. RADIATION DOSE REDUCTION: This exam was performed according to the departmental dose-optimization program which includes automated exposure control, adjustment of the mA and/or kV according to patient size and/or use of iterative reconstruction technique. COMPARISON:  Head CT 06/12/2024.  Brain MRI 05/28/2024. FINDINGS: Brain: Generalized cerebral atrophy. Patchy and ill-defined hypoattenuation within the cerebral white matter, nonspecific but compatible with moderate-to-advanced chronic small vessel ischemic disease. 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 There is no acute intracranial hemorrhage. No demarcated cortical infarct. No extra-axial fluid collection. No evidence of an intracranial mass. No midline shift. Vascular: No hyperdense vessel.  Atherosclerotic calcifications. Skull: No calvarial fracture or aggressive osseous lesion. Sinuses/Orbits: No mass or acute  finding within the imaged orbits. No significant paranasal sinus disease. IMPRESSION: 1. No evidence of an acute intracranial abnormality. 2. Parenchymal atrophy and chronic small vessel ischemic disease. This includes an unchanged chronic lacunar infarct within the right caudate nucleus. Electronically Signed   By: Rockey Childs D.O.   On: 07/05/2024 10:53     Procedures   Medications Ordered in the ED  aspirin  EC tablet 81 mg (has no administration  in time range)  atorvastatin  (LIPITOR ) tablet 80 mg (has no administration in time range)  cloNIDine  (CATAPRES ) tablet 0.1 mg (has no administration in time range)  irbesartan  (AVAPRO ) tablet 300 mg (has no administration in time range)  triamterene -hydrochlorothiazide (MAXZIDE-25) 37.5-25 MG per tablet 1 tablet (has no administration in time range)  FLUoxetine  (PROZAC ) capsule 20 mg (has no administration in time range)  LORazepam  (ATIVAN ) tablet 0.5 mg (has no administration in time range)  levothyroxine  (SYNTHROID ) tablet 50 mcg (has no administration in time range)  meclizine  (ANTIVERT ) tablet 12.5 mg (has no administration in time range)  pantoprazole  (PROTONIX ) EC tablet 40 mg (has no administration in time range)  scopolamine  (TRANSDERM-SCOP) 1 MG/3DAYS 1.5 mg (has no administration in time range)  feeding supplement (ENSURE PLUS HIGH PROTEIN) liquid 237 mL (has no administration in time range)  Dextran 70-Hypromellose 0.1-0.3 % SOLN 1 drop (has no administration in time range)  enoxaparin  (LOVENOX ) injection 40 mg (has no administration in time range)  sodium chloride  flush (NS) 0.9 % injection 3 mL (3 mLs Intravenous Given 07/05/24 1500)  acetaminophen  (TYLENOL ) tablet 650 mg (has no administration in time range)    Or  acetaminophen  (TYLENOL ) suppository 650 mg (has no administration in time range)  polyethylene glycol (MIRALAX  / GLYCOLAX ) packet 17 g (has no administration in time range)  ondansetron  (ZOFRAN ) tablet 4 mg (has no  administration in time range)    Or  ondansetron  (ZOFRAN ) injection 4 mg (has no administration in time range)  sodium chloride  flush (NS) 0.9 % injection 3 mL (3 mLs Intravenous Given 07/05/24 1159)  sodium chloride  0.9 % bolus 1,000 mL (0 mLs Intravenous Stopped 07/05/24 1301)  acetaminophen  (TYLENOL ) tablet 650 mg (650 mg Oral Given 07/05/24 1303)    Clinical Course as of 07/05/24 1536  Thu Jul 05, 2024  1340 Urinalysis, Routine w reflex microscopic -Urine, Clean Catch(!) Not evidence of UTI.  [CF]  1340 CBC(!) Anemia seems to be at the patient's baseline. [CF]  1340 Comprehensive metabolic panel(!) Mild elevation in creatinine but improved from most recent. [CF]  1531 I spoke with Dr. Seena with [facility] hospitalist who agrees to admit the patient. [CF]  1531 Urinalysis, Routine w reflex microscopic -Urine, Clean Catch(!) No evidence of UTI. [CF]  1531 Protime-INR(!) Prothrombin prolonged.  Normal INR. [CF]  1531 APTT Normal. [CF]  1531 CT HEAD WO CONTRAST I personally ordered interpreted the study.  This is negative.  I do agree with the radiologist interpretation. [CF]    Clinical Course User Index [CF] Theotis Cameron HERO, PA-C    Medical Decision Making Cindy Huerta is a 78 y.o. female patient who presents to the emergency department today for further evaluation of generalized weakness.  Patient is having some dizziness which seems to be somewhat chronic for her.  She is also having some generalized weakness which is worse than her activity living.  The son states that she is normally getting up and down the stairs 10 times or more a day.  Recently she has been unable to go up and down 3 or more times because she just profoundly weak.  Will look for some infectious causes.  She is SIRS negative at this time.  Will also get a CT scan of the head.  Patient did have a negative MRI last month.  Labs were overall well-appearing.  However, patient is still profoundly weak.  Low  suspicion for stroke at this time.  Given that the patient can barely lift  her arms and legs off of the bed do not feel the patient is safe for discharge at this time.  I do feel that she would likely benefit from further evaluation inside the hospital.  I spoke with [facility] hospitalist who agrees to admit the patient.  Amount and/or Complexity of Data Reviewed Labs: ordered. Decision-making details documented in ED Course. Radiology: ordered. Decision-making details documented in ED Course.  Risk OTC drugs. Decision regarding hospitalization.     Final diagnoses:  Generalized weakness    ED Discharge Orders     None          Theotis Cameron HERO, [place] 07/05/24 1536    Lenor Hollering, MD 07/06/24 [phone]

## 2024-07-05 NOTE — Progress Notes (Signed)
 Patient received from ED, alert and oriented X4, vital signs stable, tele box attached, call bell within reach, bed alarm on, will continue to monitor

## 2024-07-05 NOTE — ED Notes (Signed)
 3W charge called and advised patient is coming up

## 2024-07-05 NOTE — ED Notes (Signed)
 Pt ambulated to restroom with assistance. Pt was extremely dizzy upon standing

## 2024-07-05 NOTE — ED Triage Notes (Addendum)
 Pt. Stated , Cindy Huerta had some dizziness and weakness for the last couple of days  and some imbalance . I cant seem to get it together. I have some left side stomach pain, but I had a colonscopy and they couldn't find anything Pt. Alert and oriented to DOB , president, and day. Neg VAN

## 2024-07-05 NOTE — ED Notes (Signed)
 Pt son advised that patient is going up and room number

## 2024-07-05 NOTE — ED Notes (Signed)
 IV team at bedside

## 2024-07-05 NOTE — H&P (Signed)
 History and Physical   Cindy Huerta FMW:[id number] DOB: [birth year] DOA: 07/05/2024  PCP: Glover [name], MD   Patient coming from: Home  Chief Complaint: Generalized weakness, dizziness  HPI: Cindy Huerta is a 78 y.o. female with medical history significant of hypertension, hyperlipidemia, TIA, GI bleed, GERD, cervical vomiting, CKD 3A, CHF, dementia, DNR, depression, anxiety, hypothyroidism, breast cancer, QTc reevaluation presenting with weakness and dizziness.  History obtained with assistance of patient's son due to patient's chronic dementia.  Patient has had some ongoing issues with worsening weakness and dizziness for the past several days.  Patient has remained weaker ever since her most recent admission and less talkative as well.  She can go up or down the stairs a couple times a day but typically would be up and down and stronger at baseline per son.  Also with some chronic left-sided abdominal pain and some dysuria.  Patient was recently discharged and was hospitalized multiple times this year.  In May patient admitted for nausea vomiting and encephalopathy as well as electrolyte disturbance and AKI.  In June patient was admitted for recurrent nausea vomiting at that time thought to be viral as well as episode of dizziness for which she had an MRI which was negative.  Discharged to SNF at that time.  In July patient was admitted 2 times at first for nausea following down to believed to be cyclical nausea vomiting and was started on scopolamine  patch.  Repeat admission in July for recurrent nausea vomiting with AKI, electrolyte disturbance and suspected aspiration pneumonia.  Treated with antibiotics.  Underwent EGD which was unremarkable.  And then discharged home when stable as above.  Patient unable to fully participate in review of systems due to baseline dementia and encephalopathy.  ED Course: Vital signs in the ED notable for blood pressure in the 100s-150 systolic and  heart rate in the 50s.  Lab workup included CMP with sodium 133, creatinine stable 1.14, albumin 3.3.  CBC with hemoglobin stable at 9.9.  PT 16.2, INR normal, PTT normal.  Ethanol level negative.  Urinalysis with small leukocytes and rare bacteria only.  Urine culture pending.  CT head showed no acute abnormality.  Patient received Tylenol  and a liter of fluids in the ED.  Patient continued to get extremely dizzy upon standing and was very weak on exam and is unsafe for discharge.  Admission requested for evaluation of this acute worsening and generalized weakness as well as postural dizziness, new bradycardia.  Review of Systems: As per HPI otherwise all other systems reviewed and are negative.  Past Medical History:  Diagnosis Date   Acute kidney injury superimposed on chronic kidney disease (HCC) 09/13/2023   Acute metabolic encephalopathy 08/09/2023   Acute renal failure superimposed on stage 3b chronic kidney disease (HCC) 06/28/2022   AKI (acute kidney injury) (HCC) 08/09/2023   Arthritis    Breast cancer (HCC)    Depression    Elevated lactic acid level 08/17/2022   GERD (gastroesophageal reflux disease)    GIB (gastrointestinal bleeding) 10/06/2021   HLD (hyperlipidemia)    Hypertension    Hypokalemia 06/18/2022   Hyponatremia 06/18/2022   Hypothyroidism    Intractable vomiting 06/26/2024   Sepsis (HCC) 09/14/2023   UTI (urinary tract infection) 08/17/2022    Past Surgical History:  Procedure Laterality Date   ABDOMINAL HYSTERECTOMY  1982   AXILLARY SENTINEL NODE BIOPSY Left 11/20/2021   Procedure: AXILLARY SENTINEL NODE BIOPSY;  Surgeon: Tye Millet, DO;  Location: [facility] ORS;  Service: General;  Laterality: Left;   BREAST BIOPSY Left 09/23/2021   3:00 13cmfn venus marker, path pending   BREAST BIOPSY Left 09/23/2021   3:00 14 cmfn heart marker, path pending   CHOLECYSTECTOMY     ESOPHAGOGASTRODUODENOSCOPY N/A 06/27/2024   Procedure: EGD  (ESOPHAGOGASTRODUODENOSCOPY);  Surgeon: Jinny Carmine, MD;  Location: [facility] ENDOSCOPY;  Service: Endoscopy;  Laterality: N/A;   ESOPHAGOGASTRODUODENOSCOPY (EGD) WITH PROPOFOL  N/A 10/08/2021   Procedure: ESOPHAGOGASTRODUODENOSCOPY (EGD) WITH PROPOFOL ;  Surgeon: Therisa Bi, MD;  Location: [facility] ENDOSCOPY;  Service: Gastroenterology;  Laterality: N/A;   HEMATOMA EVACUATION Left 11/21/2021   Procedure: EVACUATION HEMATOMA;  Surgeon: Tye Millet, DO;  Location: [facility] ORS;  Service: General;  Laterality: Left;   IR FLUORO GUIDE CV LINE LEFT  08/17/2022   IR US  GUIDE VASC ACCESS LEFT  08/17/2022   IR US  GUIDE VASC ACCESS RIGHT  08/17/2022   left lumpectomy  2019   TOTAL MASTECTOMY Left 11/20/2021   Procedure: TOTAL MASTECTOMY;  Surgeon: Tye Millet, DO;  Location: [facility] ORS;  Service: General;  Laterality: Left;    Social History  reports that she has never smoked. She has never used smokeless tobacco. She reports that she does not drink alcohol  and does not use drugs.  Allergies  Allergen Reactions   Latex Rash   Novocain [Procaine] Other (See Comments)    Unsure - told by DDS not to let anyone give it to her  Confusion    Zestril [Lisinopril] Cough   Benadryl  [Diphenhydramine ] Other (See Comments)    Jitteriness Agitation   Biaxin [Clarithromycin] Other (See Comments)    Confusion     Roxicodone [Oxycodone] Nausea And Vomiting and Anxiety    Family History  Problem Relation Age of Onset   Heart disease Mother    Cancer Paternal Grandmother   Reviewed on admission  Prior to Admission medications   Medication Sig Start Date End Date Taking? Authorizing Provider  acetaminophen  (TYLENOL ) 500 MG tablet Take 500-1,000 mg by mouth daily as needed for moderate pain (pain score 4-6) or mild pain (pain score 1-3).    [provider]  amLODipine  (NORVASC ) 10 MG tablet Take 1 tablet (10 mg total) by mouth daily. 08/25/22   Amin, Ankit C, MD  ARTIFICIAL TEARS 0.1-0.3 % SOLN Place 1 drop  into both eyes daily as needed for dry eyes. 07/15/22   [provider]  aspirin  81 MG EC tablet Take 1 tablet (81 mg total) by mouth daily. RESTART 48HRS AFTER DISCHARGE Patient taking differently: Take 81 mg by mouth at bedtime. 11/25/21   Tye, Isami, DO  atorvastatin  (LIPITOR ) 80 MG tablet Take 80 mg by mouth daily. 09/21/21   [provider]  Cholecalciferol  (VITAMIN D -3 PO) Take 1 capsule by mouth at bedtime.    [provider]  cloNIDine  (CATAPRES ) 0.1 MG tablet Take 0.1 mg by mouth daily.    [provider]  feeding supplement (ENSURE PLUS HIGH PROTEIN) LIQD Take 237 mLs by mouth 2 (two) times daily between meals. 06/29/24   Fausto Burnard LABOR, DO  ferrous sulfate  325 (65 FE) MG EC tablet Take 325 mg by mouth at bedtime.    [provider]  FLUoxetine  (PROZAC ) 20 MG capsule Take 20 mg by mouth daily.    [provider]  fluticasone  (FLONASE ) 50 MCG/ACT nasal spray Place 1 spray into both nostrils daily as needed for allergies or rhinitis.    [provider]  hydrALAZINE  (APRESOLINE ) 25 MG tablet Take 3 tablets (  75 mg total) by mouth 3 (three) times daily. 05/28/24 06/27/24  [name], MD  levothyroxine  (SYNTHROID ) 50 MCG tablet Take 50 mcg by mouth daily before breakfast. 02/29/24 02/28/25  [provider]  LORazepam  (ATIVAN ) 0.5 MG tablet Take 1 tablet (0.5 mg total) by mouth 2 (two) times daily as needed for anxiety (shaking). 06/29/24   Fausto Burnard LABOR, DO  magnesium  oxide (MAG-OX) 400 MG tablet Take 1 tablet (400 mg total) by mouth daily. Patient taking differently: Take 400 mg by mouth at bedtime. 07/30/22   Tobie Yetta HERO, MD  meclizine  (ANTIVERT ) 12.5 MG tablet Take 1 tablet (12.5 mg total) by mouth 2 (two) times daily as needed for dizziness. 05/28/24   [name], MD  metoprolol  succinate (TOPROL -XL) 50 MG 24 hr tablet Take 50 mg by mouth 2 (two) times daily. 03/18/21   [provider]  ondansetron   (ZOFRAN -ODT) 4 MG disintegrating tablet Take 1 tablet (4 mg total) by mouth every 8 (eight) hours as needed for nausea or vomiting. 06/29/24   Fausto Burnard A, DO  pantoprazole  (PROTONIX ) 40 MG tablet Take 1 tablet (40 mg total) by mouth daily. 10/10/21 06/25/24  [name], MD  scopolamine  (TRANSDERM-SCOP) 1 MG/3DAYS Place 1 patch (1.5 mg total) onto the skin every 3 (three) days. 06/17/24 07/17/24  Trudy Anthony HERO, MD  telmisartan  (MICARDIS ) 80 MG tablet Take 80 mg by mouth daily. 05/06/24   [provider]  triamterene -hydrochlorothiazide (MAXZIDE-25) 37.5-25 MG tablet Take 1 tablet by mouth daily. 06/30/24   [provider]  trolamine salicylate (ASPERCREME) 10 % cream Apply 1 Application topically as needed for muscle pain.    [provider]    Physical Exam: Vitals:   07/05/24 1045 07/05/24 1315 07/05/24 1353 07/05/24 1354  BP: (!) 126/54 (!) 152/59    Pulse: (!) 53 (!) 56    Resp: 17 (!) 29    Temp:   (!) 97.4 F (36.3 C) 97.7 F (36.5 C)  TempSrc:   Oral Oral  SpO2: 100% 100%      Physical Exam Constitutional:      General: She is not in acute distress.    Appearance: Normal appearance.  HENT:     Head: Normocephalic and atraumatic.     Mouth/Throat:     Mouth: Mucous membranes are moist.     Pharynx: Oropharynx is clear.  Eyes:     Extraocular Movements: Extraocular movements intact.     Pupils: Pupils are equal, round, and reactive to light.  Cardiovascular:     Rate and Rhythm: Regular rhythm. Bradycardia present.     Pulses: Normal pulses.     Heart sounds: Normal heart sounds.  Pulmonary:     Effort: Pulmonary effort is normal. No respiratory distress.     Breath sounds: Normal breath sounds.  Abdominal:     General: Bowel sounds are normal. There is no distension.     Palpations: Abdomen is soft.     Tenderness: There is no abdominal tenderness.  Musculoskeletal:        General: No swelling or deformity.  Skin:     General: Skin is warm and dry.  Neurological:     General: No focal deficit present.     Mental Status: Mental status is at baseline. She is disoriented.     Comments: Intermittent shivering, anxious appearing, no focal deficit appreciated    Labs on Admission: I have personally reviewed following labs and imaging studies  CBC: Recent Labs  Lab 06/29/24 0449 07/05/24 1116 07/05/24 1124  WBC 11.5* 9.6  --   NEUTROABS  --  7.0  --   HGB 9.9* 9.9* 10.5*  HCT 28.9* 29.6* 31.0*  MCV 90.9 92.2  --   PLT 251 254  --     Basic Metabolic Panel: Recent Labs  Lab 06/29/24 0449 07/05/24 1116 07/05/24 1124  NA 137 133* 135  K 3.7 4.0 4.2  CL 102 100 100  CO2 27 23  --   GLUCOSE 103* 98 98  BUN 15 <5* 4*  CREATININE 1.09* 1.14* 1.10*  CALCIUM  9.1 9.4  --   MG 1.7  --   --     GFR: Estimated Creatinine Clearance: 38.2 mL/min (A) (by C-G formula based on SCr of 1.1 mg/dL (H)).  Liver Function Tests: Recent Labs  Lab 07/05/24 1116  AST 28  ALT 14  ALKPHOS 50  BILITOT 0.5  PROT 6.9  ALBUMIN 3.3*    Urine analysis:    Component Value Date/Time   COLORURINE STRAW (A) 07/05/2024 1227   APPEARANCEUR CLEAR 07/05/2024 1227   LABSPEC 1.004 (L) 07/05/2024 1227   PHURINE 8.0 07/05/2024 1227   GLUCOSEU NEGATIVE 07/05/2024 1227   HGBUR NEGATIVE 07/05/2024 1227   BILIRUBINUR NEGATIVE 07/05/2024 1227   KETONESUR NEGATIVE 07/05/2024 1227   PROTEINUR NEGATIVE 07/05/2024 1227   NITRITE NEGATIVE 07/05/2024 1227   LEUKOCYTESUR SMALL (A) 07/05/2024 1227    Radiological Exams on Admission: CT HEAD WO CONTRAST Result Date: 07/05/2024 CLINICAL DATA:  Provided history: Stroke, follow-up. Syncope/presyncope, cerebrovascular cause suspected. EXAM: CT HEAD WITHOUT CONTRAST TECHNIQUE: Contiguous axial images were obtained from the base of the skull through the vertex without intravenous contrast. RADIATION DOSE REDUCTION: This exam was performed according to the departmental  dose-optimization program which includes automated exposure control, adjustment of the mA and/or kV according to patient size and/or use of iterative reconstruction technique. COMPARISON:  Head CT 06/12/2024.  Brain MRI 05/28/2024. FINDINGS: Brain: Generalized cerebral atrophy. Patchy and ill-defined hypoattenuation within the cerebral white matter, nonspecific but compatible with moderate-to-advanced chronic small vessel ischemic disease. 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 There is no acute intracranial hemorrhage. No demarcated cortical infarct. No extra-axial fluid collection. No evidence of an intracranial mass. No midline shift. Vascular: No hyperdense vessel.  Atherosclerotic calcifications. Skull: No calvarial fracture or aggressive osseous lesion. Sinuses/Orbits: No mass or acute finding within the imaged orbits. No significant paranasal sinus disease. IMPRESSION: 1. No evidence of an acute intracranial abnormality. 2. Parenchymal atrophy and chronic small vessel ischemic disease. This includes an unchanged chronic lacunar infarct within the right caudate nucleus. Electronically Signed   By: Rockey Childs D.O.   On: 07/05/2024 10:53   EKG: Independently reviewed.  Sinus rhythm at 60 bpm, nonspecific T wave changes, QTc currently normal at 446.  Assessment/Plan Active Problems:   Chronic diastolic CHF (congestive heart failure) (HCC)   HTN (hypertension)   Dementia (HCC)   CKD (chronic kidney disease), stage IIIa   Depression with anxiety   History of TIA (transient ischemic attack)   HLD (hyperlipidemia)   GERD (gastroesophageal reflux disease)   Carcinoma of overlapping sites of left breast in female, estrogen receptor positive (HCC)   History of GI bleed   Cyclical vomiting   Generalized weakness New bradycardia Deconditioning > Patient presenting with worsening generalized weakness and dizziness. > Patient is significantly weak and does get dizzy on  standing, increased risk for falls. > Noted to have what  appears to be new bradycardia with heart rate in the 50s in the ED whereas during recent admissions heart rate noted to be in the 70s-90s. > Has had some dysuria and urinalysis shows small leukocytes and rare bacteria.  Urine culture ordered and is pending. > Etiology is most likely deconditioning in the setting of recent recurrent hospitalizations.  While patient does have new bradycardia did not suspect this is playing significant role, could be contributing however.  Will rule out UTI with urine culture as well. - Monitor on telemetry overnight - Hold metoprolol  - PT/OT eval and treat - Recheck TSH (mildly elevated with normal T4 a month ago)  Recurrent dizziness > Patient previously evaluated for dizziness during June admission.  Underwent MRI which was negative for central cause. - Vestibular PT - Continue home meclizine   Recurrent nausea vomiting > This appears to be currently stable.  Electrolytes stable, renal function stable. - Continue home scopolamine  patch - Continue PPI - As needed antiemetics  Hypertension - Continue clonidine , triamterene -hydrochlorothiazide - Replace home telmisartan  formulary irbesartan  - Holding metoprolol  as above - Will need to confirm if patient is still taking amlodipine  and/or hydralazine  before restarting  Hyperlipidemia - Continue home atorvastatin   History of TIA - Continue home ASA, atorvastatin   GERD - Continue home PPI  CKD 3A > Creatinine stable 1.14 - Trend renal function and electrolytes  Chronic diastolic CHF > Last echo was in July of this year with EF 60-65%, G1 DD. - Not on diuretic  Depression Anxiety Dementia - Continue home fluoxetine  - Continue as needed Ativan   Hypothyroidism - Continue home Synthroid  - Check TSH/T4 as above  History of QTc prolongation - QTc currently normal at 446  History of breast cancer - Noted   DVT  prophylaxis: Lovenox  Code Status:   DNR/DNI  Family Communication:  None on admission, no one present, EDP spoke with son per their report.   Disposition Plan:   Patient is from:  Home  Anticipated DC to:  Pending clinical course  Anticipated DC date:  1 to 3 days  Anticipated DC barriers: None  Consults called:  None Admission status:  Observation, telemetry  Severity of Illness: The appropriate patient status for this patient is OBSERVATION. Observation status is judged to be reasonable and necessary in order to provide the required intensity of service to ensure the patient's safety. The patient's presenting symptoms, physical exam findings, and initial radiographic and laboratory data in the context of their medical condition is felt to place them at decreased risk for further clinical deterioration. Furthermore, it is anticipated that the patient will be medically stable for discharge from the hospital within 2 midnights of admission.    Marsa KATHEE Scurry MD [facility]  How to contact the [facility] Attending or Consulting provider 7A - 7P or covering provider during after hours 7P -7A, for this patient?   Check the care team in [facility] and look for a) attending/consulting [facility] provider listed and b) the [facility] team listed Log into [email or web address] and use [place]'s universal password to access. If you do not have the password, please contact the hospital operator. Locate the [facility] provider you are looking for under [facility] and page to a number that you can be directly reached. If you still have difficulty reaching the provider, please page the [facility] (Director on Call) for the Hospitalists listed on amion for assistance.  07/05/2024, 2:18 PM

## 2024-07-05 NOTE — ED Notes (Signed)
 Patient transported to CT

## 2024-07-06 DIAGNOSIS — E785 Hyperlipidemia, unspecified: Secondary | ICD-10-CM | POA: Diagnosis present

## 2024-07-06 DIAGNOSIS — Z884 Allergy status to anesthetic agent status: Secondary | ICD-10-CM | POA: Diagnosis not present

## 2024-07-06 DIAGNOSIS — N390 Urinary tract infection, site not specified: Secondary | ICD-10-CM | POA: Diagnosis present

## 2024-07-06 DIAGNOSIS — Z9012 Acquired absence of left breast and nipple: Secondary | ICD-10-CM | POA: Diagnosis not present

## 2024-07-06 DIAGNOSIS — Z888 Allergy status to other drugs, medicaments and biological substances status: Secondary | ICD-10-CM | POA: Diagnosis not present

## 2024-07-06 DIAGNOSIS — I130 Hypertensive heart and chronic kidney disease with heart failure and stage 1 through stage 4 chronic kidney disease, or unspecified chronic kidney disease: Secondary | ICD-10-CM | POA: Diagnosis present

## 2024-07-06 DIAGNOSIS — Z7982 Long term (current) use of aspirin: Secondary | ICD-10-CM | POA: Diagnosis not present

## 2024-07-06 DIAGNOSIS — F0394 Unspecified dementia, unspecified severity, with anxiety: Secondary | ICD-10-CM | POA: Diagnosis present

## 2024-07-06 DIAGNOSIS — Z853 Personal history of malignant neoplasm of breast: Secondary | ICD-10-CM | POA: Diagnosis not present

## 2024-07-06 DIAGNOSIS — F32A Depression, unspecified: Secondary | ICD-10-CM | POA: Diagnosis present

## 2024-07-06 DIAGNOSIS — F0393 Unspecified dementia, unspecified severity, with mood disturbance: Secondary | ICD-10-CM | POA: Diagnosis present

## 2024-07-06 DIAGNOSIS — R1115 Cyclical vomiting syndrome unrelated to migraine: Secondary | ICD-10-CM | POA: Diagnosis not present

## 2024-07-06 DIAGNOSIS — B9620 Unspecified Escherichia coli [E. coli] as the cause of diseases classified elsewhere: Secondary | ICD-10-CM | POA: Diagnosis present

## 2024-07-06 DIAGNOSIS — N1831 Chronic kidney disease, stage 3a: Secondary | ICD-10-CM | POA: Diagnosis not present

## 2024-07-06 DIAGNOSIS — N1832 Chronic kidney disease, stage 3b: Secondary | ICD-10-CM | POA: Diagnosis present

## 2024-07-06 DIAGNOSIS — K219 Gastro-esophageal reflux disease without esophagitis: Secondary | ICD-10-CM | POA: Diagnosis present

## 2024-07-06 DIAGNOSIS — Z66 Do not resuscitate: Secondary | ICD-10-CM | POA: Diagnosis present

## 2024-07-06 DIAGNOSIS — Z8249 Family history of ischemic heart disease and other diseases of the circulatory system: Secondary | ICD-10-CM | POA: Diagnosis not present

## 2024-07-06 DIAGNOSIS — E039 Hypothyroidism, unspecified: Secondary | ICD-10-CM | POA: Diagnosis present

## 2024-07-06 DIAGNOSIS — I951 Orthostatic hypotension: Secondary | ICD-10-CM | POA: Diagnosis present

## 2024-07-06 DIAGNOSIS — Z79890 Hormone replacement therapy (postmenopausal): Secondary | ICD-10-CM | POA: Diagnosis not present

## 2024-07-06 DIAGNOSIS — E860 Dehydration: Secondary | ICD-10-CM | POA: Diagnosis present

## 2024-07-06 DIAGNOSIS — I5032 Chronic diastolic (congestive) heart failure: Secondary | ICD-10-CM | POA: Diagnosis present

## 2024-07-06 DIAGNOSIS — Z885 Allergy status to narcotic agent status: Secondary | ICD-10-CM | POA: Diagnosis not present

## 2024-07-06 DIAGNOSIS — G9341 Metabolic encephalopathy: Secondary | ICD-10-CM | POA: Diagnosis present

## 2024-07-06 DIAGNOSIS — R531 Weakness: Secondary | ICD-10-CM | POA: Diagnosis present

## 2024-07-06 DIAGNOSIS — Z79899 Other long term (current) drug therapy: Secondary | ICD-10-CM | POA: Diagnosis not present

## 2024-07-06 LAB — CBC
HCT: 28.6 % — ABNORMAL LOW (ref 36.0–46.0)
Hemoglobin: 9.8 g/dL — ABNORMAL LOW (ref 12.0–15.0)
MCH: 31.1 pg (ref 26.0–34.0)
MCHC: 34.3 g/dL (ref 30.0–36.0)
MCV: 90.8 fL (ref 80.0–100.0)
Platelets: 243 K/uL (ref 150–400)
RBC: 3.15 MIL/uL — ABNORMAL LOW (ref 3.87–5.11)
RDW: 14 % (ref 11.5–15.5)
WBC: 9.6 K/uL (ref 4.0–10.5)
nRBC: 0 % (ref 0.0–0.2)

## 2024-07-06 LAB — COMPREHENSIVE METABOLIC PANEL WITH GFR
ALT: 14 U/L (ref 0–44)
AST: 23 U/L (ref 15–41)
Albumin: 3.1 g/dL — ABNORMAL LOW (ref 3.5–5.0)
Alkaline Phosphatase: 46 U/L (ref 38–126)
Anion gap: 10 (ref 5–15)
BUN: 9 mg/dL (ref 8–23)
CO2: 21 mmol/L — ABNORMAL LOW (ref 22–32)
Calcium: 9.1 mg/dL (ref 8.9–10.3)
Chloride: 105 mmol/L (ref 98–111)
Creatinine, Ser: 1.16 mg/dL — ABNORMAL HIGH (ref 0.44–1.00)
GFR, Estimated: 48 mL/min — ABNORMAL LOW (ref >60)
Glucose, Bld: 84 mg/dL (ref 70–99)
Potassium: 3.9 mmol/L (ref 3.5–5.1)
Sodium: 136 mmol/L (ref 135–145)
Total Bilirubin: 0.3 mg/dL (ref 0.0–1.2)
Total Protein: 6.4 g/dL — ABNORMAL LOW (ref 6.5–8.1)

## 2024-07-06 MED ORDER — LORAZEPAM 0.5 MG PO TABS
0.5000 mg | ORAL_TABLET | Freq: Two times a day (BID) | ORAL | Status: DC | PRN
  Administered 2024-07-06 – 2024-07-08 (×5): 0.5 mg via ORAL
  Filled 2024-07-06 (×5): qty 1

## 2024-07-06 MED ORDER — LORAZEPAM 2 MG/ML PO CONC: 0.5000 mg | Freq: Two times a day (BID) | ORAL | Status: DC | PRN

## 2024-07-06 MED ORDER — SODIUM CHLORIDE 0.9 % IV SOLN
1.0000 g | INTRAVENOUS | Status: DC
  Administered 2024-07-06 – 2024-07-07 (×2): 1 g via INTRAVENOUS
  Filled 2024-07-06 (×2): qty 10

## 2024-07-06 MED ORDER — SODIUM CHLORIDE 0.9 % IV SOLN: INTRAVENOUS | Status: DC

## 2024-07-06 NOTE — TOC Initial Note (Signed)
 Transition of Care [facility]) - Initial/Assessment Note    Patient Details  Name: Cindy Huerta MRN: [id number] Date of Birth: 27-Mar-1946  [facility] CM/SW Contact:    Almarie CHRISTELLA Goodie, LCSW Phone Number: 07/06/2024, 2:19 PM  Clinical Narrative:       CSW spoke with patient's son, Ludie, to discuss recommendation for SNF. Son in agreement, preference for [facility]. CSW contacted [facility], they do have a bed available. CSW sent referral and confirmed [facility] can accept. CSW contacted [facility] to request insurance authorization. CSW to follow.            Expected Discharge Plan: Skilled Nursing Facility Barriers to Discharge: Continued Medical Work up, English as a second language teacher   Patient Goals and CMS Choice Patient states their goals for this hospitalization and ongoing recovery are:: to feel better CMS Medicare.gov Compare Post Acute Care list provided to:: Patient Choice offered to / list presented to : Adult Children, Patient [facility] ownership interest in [facility].provided to:: Patient    Expected Discharge Plan and Services     Post Acute Care Choice: Skilled Nursing Facility Living arrangements for the past 2 months: Single Family Home                                      Prior Living Arrangements/Services Living arrangements for the past 2 months: Single Family Home Lives with:: Adult Children Patient language and need for interpreter reviewed:: No        Need for Family Participation in Patient Care: Yes (Comment) Care giver support system in place?: No (comment) Current home services: DME, Home OT, Home PT Criminal Activity/Legal Involvement Pertinent to Current Situation/Hospitalization: No - Comment as needed  Activities of Daily Living   ADL Screening (condition at time of admission) Independently performs ADLs?: No Does the patient have a NEW difficulty with bathing/dressing/toileting/self-feeding that is expected to last >3  days?: Yes (Initiates electronic notice to provider for possible OT consult) Does the patient have a NEW difficulty with getting in/out of bed, walking, or climbing stairs that is expected to last >3 days?: Yes (Initiates electronic notice to provider for possible PT consult) Does the patient have a NEW difficulty with communication that is expected to last >3 days?: No Is the patient deaf or have difficulty hearing?: Yes Does the patient have difficulty seeing, even when wearing glasses/contacts?: Yes Does the patient have difficulty concentrating, remembering, or making decisions?: Yes  Permission Sought/Granted Permission sought to share information with : Facility Medical sales representative, Family Supports Permission granted to share information with : Yes, Verbal Permission Granted  Share Information with NAME: Ludie  Permission granted to share info w AGENCY: SNF  Permission granted to share info w Relationship: Son     Emotional Assessment Appearance:: Appears stated age Attitude/Demeanor/Rapport: Engaged Affect (typically observed): Appropriate Orientation: : Oriented to Self, Oriented to Place, Oriented to  Time, Oriented to Situation Alcohol  / Substance Use: Not Applicable Psych Involvement: No (comment)  Admission diagnosis:  Weakness [R53.1] Generalized weakness [R53.1] Patient Active Problem List   Diagnosis Date Noted   Cyclical vomiting with nausea 06/12/2024   Headache 06/12/2024   Cyclical vomiting 06/12/2024   Lactic acidosis 05/25/2024   Hypochloremia 05/25/2024   Dementia (HCC) 05/25/2024   Nausea and vomiting 04/29/2024   Leukocytosis 09/13/2023   Abnormal TSH 09/13/2023   Syncope 08/09/2023   Hypomagnesemia 08/09/2023   QT prolongation  08/09/2023   Elevated troponin 08/09/2023   Iron  deficiency anemia 08/17/2022   Fall at home, initial encounter 08/17/2022   Abnormal LFTs 08/17/2022   Dehydration 07/27/2022   Malnutrition of moderate degree 06/30/2022    Acute urinary retention 06/29/2022   DNR (do not resuscitate) 06/28/2022   Chronic diastolic CHF (congestive heart failure) (HCC) 06/18/2022   Dysuria 06/18/2022   HTN (hypertension) 11/24/2021   Normocytic anemia 11/24/2021   Vertigo 11/24/2021   Breast cancer (HCC) 11/20/2021   Intractable nausea and vomiting 10/09/2021   Weakness 10/07/2021   Carcinoma of overlapping sites of left breast in female, estrogen receptor positive (HCC) 10/06/2021   Dizziness 10/06/2021   History of GI bleed 10/06/2021   Left-sided weakness 07/29/2021   HLD (hyperlipidemia) 07/29/2021   Hypothyroidism 07/29/2021   Left sided numbness 07/29/2021   Depression with anxiety    CKD (chronic kidney disease), stage IIIa    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   Epigastric pain 04/23/2021   GERD (gastroesophageal reflux disease) 04/23/2021   History of breast cancer 01/20/2021   History of TIA (transient ischemic attack) 01/20/2021   Palpitations 01/20/2021   Malignant neoplasm of 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) 12/06/2018   PCP:  Glover [name], MD Pharmacy:   [facility] [phone] - [address], [place] - [address] [place] [address] Phone: [phone] Fax: [phone]  [facility] [address] [place] [address] Phone: [phone] Fax: [phone]     Social Drivers of Health (SDOH) Social History: SDOH Screenings   Food Insecurity: No Food Insecurity (07/05/2024)  Housing: Unknown (07/05/2024)  Transportation Needs: No Transportation Needs (07/05/2024)  Utilities: Not At Risk (07/05/2024)  Financial Resource Strain: Patient Declined (12/29/2022)   Received from [facility]  Social Connections: Unknown (07/05/2024)  Recent Concern: Social Connections - Moderately Isolated (06/13/2024)  Tobacco Use: Low Risk  (06/27/2024)   SDOH Interventions:     Readmission Risk Interventions    06/15/2024   12:42 PM  05/01/2024   12:59 PM 09/13/2023   10:08 AM  Readmission Risk Prevention Plan  Transportation Screening Complete  Complete  PCP or Specialist Appt within 3-5 Days   Complete  HRI or Home Care Consult   Complete  Social Work Consult for Recovery Care Planning/Counseling   Complete  Palliative Care Screening   Not Applicable  Medication Review Oceanographer)  Complete Not Complete  Med Review Comments   Patient's son will speak with nurses upon discharge  PCP or Specialist appointment within 3-5 days of discharge Complete Complete   HRI or Home Care Consult Complete Complete   SW Recovery Care/Counseling Consult  Complete   Palliative Care Screening Not Applicable Not Applicable   Skilled Nursing Facility Complete Patient Refused

## 2024-07-06 NOTE — Progress Notes (Signed)
 PROGRESS NOTE    Cindy Huerta  FMW:[id number] DOB: [birth year] DOA: 07/05/2024 PCP: Glover [name], MD  Outpatient Specialists:     Brief Narrative:  Patient is an 78 year old female with past medical history significant for dementia, hypertension, hyperlipidemia, TIA, GI bleed, GERD, cyclical vomiting, chronic kidney disease stage III, CHF, hypothyroidism, breast cancer, UTI, electrolyte abnormality and acute kidney injury.  Patient was admitted with generalized weakness, dizziness and mild bradycardia.  Patient has been admitted several in the last 2 to 3 months.  Urine culture done on presentation is growing E. coli.  Blood sugars controlled and chemistry reveals BUN of 9, serum creatinine of 1.16 with CO2 of 21 and albumin of 3.1.  CBC reveals hemoglobin of 9.8, hematocrit of 28.6 and MCV of 90.8.  CT head has not revealed any acute findings.  07/06/2024: Patient seen.  No significant history from patient.  Urine cultures growing E. coli.  Will start patient on IV Rocephin .  Follow final urine culture.   Assessment & Plan:   Principal Problem:   Weakness Active Problems:   HLD (hyperlipidemia)   Depression with anxiety   CKD (chronic kidney disease), stage IIIa   GERD (gastroesophageal reflux disease)   History of TIA (transient ischemic attack)   Carcinoma of overlapping sites of left breast in female, estrogen receptor positive (HCC)   History of GI bleed   HTN (hypertension)   Chronic diastolic CHF (congestive heart failure) (HCC)   Dementia (HCC)   Cyclical vomiting   UTI secondary to E. coli: - Send sample for blood cultures. - Start IV Rocephin . - Follow final culture results.  Generalized weakness New bradycardia Deconditioning > Patient presenting with worsening generalized weakness and dizziness. > Patient is significantly weak and does get dizzy on standing, increased risk for falls. > Noted to have what appears to be new bradycardia with heart rate in the  50s in the ED whereas during recent admissions heart rate noted to be in the 70s-90s. > Has had some dysuria and urinalysis shows small leukocytes and rare bacteria.  Urine culture ordered and is pending. > Etiology is most likely deconditioning in the setting of recent recurrent hospitalizations.  While patient does have new bradycardia did not suspect this is playing significant role, could be contributing however.  Will rule out UTI with urine culture as well. - Monitor on telemetry overnight - Hold metoprolol  - PT/OT eval and treat - Recheck TSH (mildly elevated with normal T4 a month ago) 07/06/2024: Patient may be feeling to thrive.   Recurrent dizziness > Patient previously evaluated for dizziness during June admission.  Underwent MRI which was negative for central cause. - Vestibular PT - Continue home meclizine  07/06/2024: Possible orthostatic dizziness.   Recurrent nausea vomiting > This appears to be currently stable.  Electrolytes stable, renal function stable. - Continue home scopolamine  patch - Continue PPI - As needed antiemetics   Hypertension - Continue clonidine , triamterene -hydrochlorothiazide - Replace home telmisartan  formulary irbesartan  - Holding metoprolol  as above - Will need to confirm if patient is still taking amlodipine  and/or hydralazine  before restarting   Hyperlipidemia - Continue home atorvastatin    History of TIA - Continue home ASA, atorvastatin    GERD - Continue home PPI   CKD 3A > Creatinine stable 1.14 - Trend renal function and electrolytes   Chronic diastolic CHF > Last echo was in July of this year with EF 60-65%, G1 DD. - Not on diuretic   Depression Anxiety Dementia - Continue home fluoxetine  -  Continue as needed Ativan    Hypothyroidism - Continue home Synthroid  - Check TSH/T4 as above  Volume depletion: - Start patient on IV fluids.   History of QTc prolongation - QTc currently normal at 446   History of breast  cancer - Noted      DVT prophylaxis: Subcutaneous Lovenox  Code Status: DNR limited Family Communication:  Disposition Plan: Inpatient.   Consultants:  None  Procedures:  None  Antimicrobials:  IV Rocephin .   Subjective: No significant history from patient.  Objective: Vitals:   07/05/24 2335 07/06/24 0251 07/06/24 0714 07/06/24 1125  BP: (!) 118/56 123/64 (!) 130/53 (!) 117/56  Pulse: (!) 59 (!) 54 (!) 52 (!) 59  Resp: 18 18 17 18   Temp: 99.1 F (37.3 C) 98.4 F (36.9 C) 98.3 F (36.8 C) 98.2 F (36.8 C)  TempSrc: Oral Oral Oral Oral  SpO2: 99% 97% 98% 98%   No intake or output data in the 24 hours ending 07/06/24 1303 There were no vitals filed for this visit.  Examination:  General exam: Appears calm and comfortable.  Remains encephalopathic. Respiratory system: Clear to auscultation.  Cardiovascular system: S1 & S2 heard Gastrointestinal system: Abdomen is soft and nontender. Central nervous system: Awake and alert.     Data Reviewed: I have personally reviewed following labs and imaging studies  CBC: Recent Labs  Lab 07/05/24 1116 07/05/24 1124 07/06/24 0343  WBC 9.6  --  9.6  NEUTROABS 7.0  --   --   HGB 9.9* 10.5* 9.8*  HCT 29.6* 31.0* 28.6*  MCV 92.2  --  90.8  PLT 254  --  243   Basic Metabolic Panel: Recent Labs  Lab 07/05/24 1116 07/05/24 1124 07/06/24 0343  NA 133* 135 136  K 4.0 4.2 3.9  CL 100 100 105  CO2 23  --  21*  GLUCOSE 98 98 84  BUN <5* 4* 9  CREATININE 1.14* 1.10* 1.16*  CALCIUM  9.4  --  9.1   GFR: Estimated Creatinine Clearance: 36.2 mL/min (A) (by C-G formula based on SCr of 1.16 mg/dL (H)). Liver Function Tests: Recent Labs  Lab 07/05/24 1116 07/06/24 0343  AST 28 23  ALT 14 14  ALKPHOS 50 46  BILITOT 0.5 0.3  PROT 6.9 6.4*  ALBUMIN 3.3* 3.1*   No results for input(s): LIPASE, AMYLASE in the last 168 hours. No results for input(s): AMMONIA in the last 168 hours. Coagulation Profile: Recent  Labs  Lab 07/05/24 1116  INR 1.2   Cardiac Enzymes: No results for input(s): CKTOTAL, CKMB, CKMBINDEX, TROPONINI in the last 168 hours. BNP (last 3 results) No results for input(s): PROBNP in the last 8760 hours. HbA1C: No results for input(s): HGBA1C in the last 72 hours. CBG: Recent Labs  Lab 07/05/24 1022  GLUCAP 112*   Lipid Profile: No results for input(s): CHOL, HDL, LDLCALC, TRIG, CHOLHDL, LDLDIRECT in the last 72 hours. Thyroid  Function Tests: Recent Labs    07/05/24 1116  TSH 15.306*  FREET4 1.25*   Anemia Panel: No results for input(s): VITAMINB12, FOLATE, FERRITIN, TIBC, IRON , RETICCTPCT in the last 72 hours. Urine analysis:    Component Value Date/Time   COLORURINE STRAW (A) 07/05/2024 1227   APPEARANCEUR CLEAR 07/05/2024 1227   LABSPEC 1.004 (L) 07/05/2024 1227   PHURINE 8.0 07/05/2024 1227   GLUCOSEU NEGATIVE 07/05/2024 1227   HGBUR NEGATIVE 07/05/2024 1227   BILIRUBINUR NEGATIVE 07/05/2024 1227   KETONESUR NEGATIVE 07/05/2024 1227   PROTEINUR NEGATIVE 07/05/2024 1227  NITRITE NEGATIVE 07/05/2024 1227   LEUKOCYTESUR SMALL (A) 07/05/2024 1227   Sepsis Labs: @LABRCNTIP (procalcitonin:4,lacticidven:4)  ) Recent Results (from the past 240 hours)  Urine Culture     Status: Abnormal (Preliminary result)   Collection Time: 07/05/24 11:21 AM   Specimen: Urine, Clean Catch  Result Value Ref Range Status   Specimen Description URINE, CLEAN CATCH  Final   Special Requests NONE  Final   Culture (A)  Final    >=100,000 COLONIES/mL ESCHERICHIA COLI SUSCEPTIBILITIES TO FOLLOW Performed at [facility], [address], [place], [place] [address]    Report Status PENDING  Incomplete         Radiology Studies: CT HEAD WO CONTRAST Result Date: 07/05/2024 CLINICAL DATA:  Provided history: Stroke, follow-up. Syncope/presyncope, cerebrovascular cause suspected. EXAM: CT HEAD WITHOUT CONTRAST TECHNIQUE: Contiguous  axial images were obtained from the base of the skull through the vertex without intravenous contrast. RADIATION DOSE REDUCTION: This exam was performed according to the departmental dose-optimization program which includes automated exposure control, adjustment of the mA and/or kV according to patient size and/or use of iterative reconstruction technique. COMPARISON:  Head CT 06/12/2024.  Brain MRI 05/28/2024. FINDINGS: Brain: Generalized cerebral atrophy. Patchy and ill-defined hypoattenuation within the cerebral white matter, nonspecific but compatible with moderate-to-advanced chronic small vessel ischemic disease. 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 There is no acute intracranial hemorrhage. No demarcated cortical infarct. No extra-axial fluid collection. No evidence of an intracranial mass. No midline shift. Vascular: No hyperdense vessel.  Atherosclerotic calcifications. Skull: No calvarial fracture or aggressive osseous lesion. Sinuses/Orbits: No mass or acute finding within the imaged orbits. No significant paranasal sinus disease. IMPRESSION: 1. No evidence of an acute intracranial abnormality. 2. Parenchymal atrophy and chronic small vessel ischemic disease. This includes an unchanged chronic lacunar infarct within the right caudate nucleus. Electronically Signed   By: Rockey Childs D.O.   On: 07/05/2024 10:53        Scheduled Meds:  aspirin  EC  81 mg Oral QHS   atorvastatin   80 mg Oral Daily   cloNIDine   0.1 mg Oral Daily   enoxaparin  (LOVENOX ) injection  40 mg Subcutaneous Q24H   feeding supplement  237 mL Oral BID BM   FLUoxetine   20 mg Oral Daily   irbesartan   300 mg Oral Daily   levothyroxine   50 mcg Oral QAC breakfast   pantoprazole   40 mg Oral Daily   scopolamine   1 patch Transdermal Q72H   sodium chloride  flush  3 mL Intravenous Q12H   triamterene -hydrochlorothiazide  1 tablet Oral Daily   Continuous Infusions:   LOS: 0 days    Time spent:  55 minutes.    Leatrice Chapel, MD  [facility] Pager #: [phone] 7PM-7AM contact night coverage as above

## 2024-07-06 NOTE — Plan of Care (Signed)
  Problem: Education: Goal: Knowledge of General Education information will improve Description: Including pain rating scale, medication(s)/side effects and non-pharmacologic comfort measures Outcome: Progressing   Problem: Health Behavior/Discharge Planning: Goal: Ability to manage health-related needs will improve Outcome: Not Progressing   Problem: Clinical Measurements: Goal: Ability to maintain clinical measurements within normal limits will improve Outcome: Progressing Goal: Will remain free from infection Outcome: Progressing Goal: Diagnostic test results will improve Outcome: Progressing Goal: Respiratory complications will improve Outcome: Progressing Goal: Cardiovascular complication will be avoided Outcome: Progressing   Problem: Nutrition: Goal: Adequate nutrition will be maintained Outcome: Progressing   Problem: Coping: Goal: Level of anxiety will decrease Outcome: Progressing   Problem: Elimination: Goal: Will not experience complications related to bowel motility Outcome: Progressing Goal: Will not experience complications related to urinary retention Outcome: Progressing   Problem: Pain Managment: Goal: General experience of comfort will improve and/or be controlled Outcome: Progressing   Problem: Safety: Goal: Ability to remain free from injury will improve Outcome: Progressing   Problem: Skin Integrity: Goal: Risk for impaired skin integrity will decrease Outcome: Progressing

## 2024-07-06 NOTE — NC FL2 (Signed)
 [place]  MEDICAID FL2 LEVEL OF CARE FORM     IDENTIFICATION  Patient Name: Cindy Huerta Birthdate: August 20, 1946 Sex: female Admission Date (Current Location): 07/05/2024  [facility] and [place] Number:  Producer, television/film/video and Address:  [facility] [place], [address], [place], [place] [address]      Provider Number: [id number]  Attending Physician Name and Address:  Rosario Leatrice FERNS, MD  Relative Name and Phone Number:       Current Level of Care: Hospital Recommended Level of Care: Skilled Nursing Facility Prior Approval Number:    Date Approved/Denied:   PASRR Number: [id number]  Discharge Plan: SNF    Current Diagnoses: Patient Active Problem List   Diagnosis Date Noted   Cyclical vomiting with nausea 06/12/2024   Headache 06/12/2024   Cyclical vomiting 06/12/2024   Lactic acidosis 05/25/2024   Hypochloremia 05/25/2024   Dementia (HCC) 05/25/2024   Nausea and vomiting 04/29/2024   Leukocytosis 09/13/2023   Abnormal TSH 09/13/2023   Syncope 08/09/2023   Hypomagnesemia 08/09/2023   QT prolongation 08/09/2023   Elevated troponin 08/09/2023   Iron  deficiency anemia 08/17/2022   Fall at home, initial encounter 08/17/2022   Abnormal LFTs 08/17/2022   Dehydration 07/27/2022   Malnutrition of moderate degree 06/30/2022   Acute urinary retention 06/29/2022   DNR (do not resuscitate) 06/28/2022   Chronic diastolic CHF (congestive heart failure) (HCC) 06/18/2022   Dysuria 06/18/2022   HTN (hypertension) 11/24/2021   Normocytic anemia 11/24/2021   Vertigo 11/24/2021   Breast cancer (HCC) 11/20/2021   Intractable nausea and vomiting 10/09/2021   Weakness 10/07/2021   Carcinoma of overlapping sites of left breast in female, estrogen receptor positive (HCC) 10/06/2021   Dizziness 10/06/2021   History of GI bleed 10/06/2021   Left-sided weakness 07/29/2021   HLD (hyperlipidemia) 07/29/2021   Hypothyroidism 07/29/2021   Left sided numbness  07/29/2021   Depression with anxiety    CKD (chronic kidney disease), stage IIIa    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   Epigastric pain 04/23/2021   GERD (gastroesophageal reflux disease) 04/23/2021   History of breast cancer 01/20/2021   History of TIA (transient ischemic attack) 01/20/2021   Palpitations 01/20/2021   Malignant neoplasm of 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) 12/06/2018    Orientation RESPIRATION BLADDER Height & Weight     Self, Time, Situation, Place  Normal Incontinent Weight:   Height:     BEHAVIORAL SYMPTOMS/MOOD NEUROLOGICAL BOWEL NUTRITION STATUS      Continent Diet (Please refer to d/c note)  AMBULATORY STATUS COMMUNICATION OF NEEDS Skin   Extensive Assist Verbally Normal                       Personal Care Assistance Level of Assistance  Bathing, Feeding, Dressing Bathing Assistance: Maximum assistance Feeding assistance: Limited assistance Dressing Assistance: Maximum assistance     Functional Limitations Info  Sight, Hearing, Speech Sight Info: Adequate Hearing Info: Impaired (impaired hearing both ears) Speech Info: Adequate    SPECIAL CARE FACTORS FREQUENCY  PT (By licensed PT), OT (By licensed OT)     PT Frequency: 5x/week OT Frequency: 5x/week            Contractures Contractures Info: Not present    Additional Factors Info  Code Status, Allergies Code Status Info: DNR Allergies Info: Novocain (procaine), Zestril (lisinopril), Benadryl  (diphenhydramine ), Biaxin (clarithromycin), Roxicodone (oxycodone), latex           Current Medications (  07/06/2024):  This is the current hospital active medication list Current Facility-Administered Medications  Medication Dose Route Frequency Provider Last Rate Last Admin   acetaminophen  (TYLENOL ) tablet 650 mg  650 mg Oral Q6H PRN Seena Marsa NOVAK, MD   650 mg at 07/06/24 1222   Or   acetaminophen  (TYLENOL ) suppository 650 mg  650 mg Rectal Q6H PRN Seena Marsa NOVAK, MD        artificial tears ophthalmic solution 1 drop  1 drop Both Eyes Daily PRN Melvin, Alexander B, MD       aspirin  EC tablet 81 mg  81 mg Oral QHS Melvin, Alexander B, MD   81 mg at 07/05/24 2145   atorvastatin  (LIPITOR ) tablet 80 mg  80 mg Oral Daily Melvin, Alexander B, MD   80 mg at 07/06/24 9148   cefTRIAXone  (ROCEPHIN ) 1 g in sodium chloride  0.9 % 100 mL IVPB  1 g Intravenous Q24H Rosario Eland I, MD       cloNIDine  (CATAPRES ) tablet 0.1 mg  0.1 mg Oral Daily Melvin, Alexander B, MD   0.1 mg at 07/06/24 9148   enoxaparin  (LOVENOX ) injection 40 mg  40 mg Subcutaneous Q24H Melvin, Alexander B, MD   40 mg at 07/05/24 2145   feeding supplement (ENSURE PLUS HIGH PROTEIN) liquid 237 mL  237 mL Oral BID BM Melvin, Alexander B, MD   237 mL at 07/06/24 9144   FLUoxetine  (PROZAC ) capsule 20 mg  20 mg Oral Daily Melvin, Alexander B, MD   20 mg at 07/06/24 9148   irbesartan  (AVAPRO ) tablet 300 mg  300 mg Oral Daily Melvin, Alexander B, MD   300 mg at 07/06/24 9148   levothyroxine  (SYNTHROID ) tablet 50 mcg  50 mcg Oral QAC breakfast Seena Marsa NOVAK, MD   50 mcg at 07/06/24 0602   LORazepam  (ATIVAN ) 2 MG/ML concentrated solution 0.5 mg  0.5 mg Sublingual BID PRN Reome, Earle J, RPH       Or   LORazepam  (ATIVAN ) tablet 0.5 mg  0.5 mg Oral BID PRN Reome, Earle J, RPH   0.5 mg at 07/06/24 1223   meclizine  (ANTIVERT ) tablet 12.5 mg  12.5 mg Oral BID PRN Melvin, Alexander B, MD       ondansetron  (ZOFRAN ) tablet 4 mg  4 mg Oral Q6H PRN Seena Marsa NOVAK, MD       Or   ondansetron  (ZOFRAN ) injection 4 mg  4 mg Intravenous Q6H PRN Seena Marsa NOVAK, MD   4 mg at 07/06/24 1210   pantoprazole  (PROTONIX ) EC tablet 40 mg  40 mg Oral Daily Melvin, Alexander B, MD   40 mg at 07/06/24 9148   polyethylene glycol (MIRALAX  / GLYCOLAX ) packet 17 g  17 g Oral Daily PRN Seena Marsa NOVAK, MD       scopolamine  (TRANSDERM-SCOP) 1 MG/3DAYS 1.5 mg  1 patch Transdermal Q72H Melvin, Alexander B, MD   1.5 mg at  07/06/24 0849   sodium chloride  flush (NS) 0.9 % injection 3 mL  3 mL Intravenous Q12H Melvin, Alexander B, MD   3 mL at 07/06/24 9144   triamterene -hydrochlorothiazide (MAXZIDE-25) 37.5-25 MG per tablet 1 tablet  1 tablet Oral Daily Melvin, Alexander B, MD   1 tablet at 07/06/24 9148     Discharge Medications: Please see discharge summary for a list of discharge medications.  Relevant Imaging Results:  Relevant Lab Results:   Additional Information SS# [id number]  Sherline Clack, LCSWA

## 2024-07-06 NOTE — Care Management Obs Status (Signed)
 MEDICARE OBSERVATION STATUS NOTIFICATION   Patient Details  Name: Cindy Huerta MRN: [id number] Date of Birth: [birth year]   Medicare Observation Status Notification Given:  Yes Patient did not sign but I called to her son Cindy Huerta to let him know of her obs status   Mareena Cavan 07/06/2024, 1:07 PM

## 2024-07-06 NOTE — Progress Notes (Signed)
 Physical Therapy Treatment Patient Details Name: Cindy Huerta MRN: [id number] DOB: [birth year] Today's Date: 07/06/2024   History of Present Illness Pt is a 78 y.o. F who presents 07/05/2024 with dizziness and weakness. CT head negative for acute abnormality. Significant PMH: hypertension, hyperlipidemia, TIA, GI bleed, GERD, cervical vomiting, CKD 3A, CHF, dementia, DNR, depression, anxiety, hypothyroidism, breast cancer.    PT Comments  Pt received in supine and requesting to use the [facility]. Pt able to complete bed mobility and transfers with CGA for safety. Pt able to complete pericare with min A in standing. Pt able to tolerate standing to take BP, but declines further mobility due to weakness and fatigue. BP noted to drop to 97/53 while standing, but pt reports no dizziness throughout session. Pt short of breath throughout session, but SpO2 100%. Pt continues to benefit from PT services to progress toward functional mobility goals.     If plan is discharge home, recommend the following: Two people to help with walking and/or transfers;Two people to help with bathing/dressing/bathroom   Can travel by private vehicle     No  Equipment Recommendations  Wheelchair (measurements PT);Wheelchair cushion (measurements PT)    Recommendations for Other Services       Precautions / Restrictions Precautions Precautions: Fall Recall of Precautions/Restrictions: Impaired Restrictions Weight Bearing Restrictions Per Provider Order: No     Mobility  Bed Mobility Overal bed mobility: Needs Assistance Bed Mobility: Supine to Sit, Sit to Supine     Supine to sit: Supervision Sit to supine: Contact guard assist   General bed mobility comments: increased time and effort    Transfers Overall transfer level: Needs assistance Equipment used: Rolling walker (2 wheels) Transfers: Sit to/from Stand, Bed to chair/wheelchair/BSC Sit to Stand: Contact guard assist   Step pivot transfers: Contact  guard assist       General transfer comment: STS from EOB and BSC with cues for hand placement. short, shuffled steps to the [facility]. Assist to stabilize BSC, but no physical assist.    Ambulation/Gait               General Gait Details: unable to progress due to weakness and fatigue   Stairs             Wheelchair Mobility     Tilt Bed    Modified Rankin (Stroke Patients Only)       Balance Overall balance assessment: Needs assistance Sitting-balance support: Feet supported Sitting balance-Leahy Scale: Fair Sitting balance - Comments: sitting EOB   Standing balance support: During functional activity, Reliant on assistive device for balance, Bilateral upper extremity supported Standing balance-Leahy Scale: Poor Standing balance comment: with RW support                            Communication Communication Communication: No apparent difficulties Factors Affecting Communication: Reduced clarity of speech (Pt very short of breath making it difficult to get words out)  Cognition Arousal: Alert Behavior During Therapy: Anxious   PT - Cognitive impairments: History of cognitive impairments                         Following commands: Intact      Cueing Cueing Techniques: Verbal cues, Gestural cues  Exercises      General Comments General comments (skin integrity, edema, etc.): BP sitting on BSC: 124/85; Standing: 97/53; supine at end of session: 135/53. HR WFL  Pertinent Vitals/Pain Pain Assessment Pain Assessment: No/denies pain     PT Goals (current goals can now be found in the care plan section) Acute Rehab PT Goals Patient Stated Goal: to feel better PT Goal Formulation: With patient Time For Goal Achievement: 07/19/24 Progress towards PT goals: Progressing toward goals    Frequency    Min 2X/week       AM-PAC PT 6 Clicks Mobility   Outcome Measure  Help needed turning from your back to your side while  in a flat bed without using bedrails?: A Little Help needed moving from lying on your back to sitting on the side of a flat bed without using bedrails?: A Little Help needed moving to and from a bed to a chair (including a wheelchair)?: A Little Help needed standing up from a chair using your arms (e.g., wheelchair or bedside chair)?: A Little Help needed to walk in hospital room?: Total Help needed climbing 3-5 steps with a railing? : Total 6 Click Score: 14    End of Session Equipment Utilized During Treatment: Gait belt Activity Tolerance: Patient limited by fatigue Patient left: in bed;with call bell/phone within reach;with bed alarm set Nurse Communication: Mobility status PT Visit Diagnosis: Muscle weakness (generalized) (M62.81);Other abnormalities of gait and mobility (R26.89);Dizziness and giddiness (R42)     Time: 9140-9079 PT Time Calculation (min) (ACUTE ONLY): 21 min  Charges:    $Therapeutic Activity: 8-22 mins PT General Charges $$ ACUTE PT VISIT: 1 Visit                     Darryle George, PTA Acute Rehabilitation Services Secure Chat Preferred  [phone]    Darryle George 07/06/2024, 12:03 PM

## 2024-07-06 NOTE — Evaluation (Signed)
 Occupational Therapy Evaluation Patient Details Name: Cindy Huerta MRN: [id number] DOB: May 20, 1946 Today's Date: 07/06/2024   History of Present Illness   Pt is a 78 y.o. F who presents 07/05/2024 with dizziness and weakness. CT head negative for acute abnormality. Significant PMH: hypertension, hyperlipidemia, TIA, GI bleed, GERD, cervical vomiting, CKD 3A, CHF, dementia, DNR, depression, anxiety, hypothyroidism, breast cancer.     Clinical Impressions PTA per chart pt independent for ADLs, supervision for showering and assist for IADLs; using RW vs rollator for mobility.  Limited OT eval due to nausea and vomiting, but assisted pt back to bed.  Pt anxious throughout session. Overall min guard for stand pivot transfers, mod assist for bed mobility and up to min assist for Adls.  RN aware of vomiting, NT at side upon therapist exit.  Based on performance today, believe patient will best benefit from continued OT services acutely and after dc at inpatient setting with <3hrs/day to optimize independence, safety with ADLs and mobility.      If plan is discharge home, recommend the following:   Assistance with cooking/housework;Supervision due to cognitive status;A little help with walking and/or transfers;A lot of help with bathing/dressing/bathroom;Direct supervision/assist for medications management;Direct supervision/assist for financial management;Help with stairs or ramp for entrance     Functional Status Assessment   Patient has had a recent decline in their functional status and/or demonstrates limited ability to make significant improvements in function in a reasonable and predictable amount of time     Equipment Recommendations   Other (comment) (defer)     Recommendations for Other Services         Precautions/Restrictions   Precautions Precautions: Fall Recall of Precautions/Restrictions: Impaired Restrictions Weight Bearing Restrictions Per Provider Order:  No     Mobility Bed Mobility Overal bed mobility: Needs Assistance Bed Mobility: Sit to Supine       Sit to supine: Mod assist   General bed mobility comments: pt sitting EOB, nauseated and needing assist to bring BLEs back to supine    Transfers Overall transfer level: Needs assistance   Transfers: Sit to/from Stand Sit to Stand: Contact guard assist           General transfer comment: stand pivot from recliner to EOB, guarding support      Balance Overall balance assessment: Needs assistance Sitting-balance support: Feet supported Sitting balance-Leahy Scale: Fair Sitting balance - Comments: sitting EOB   Standing balance support: During functional activity, Bilateral upper extremity supported Standing balance-Leahy Scale: Poor Standing balance comment: relies on external support of therapist                           ADL either performed or assessed with clinical judgement   ADL Overall ADL's : Needs assistance/impaired                 Upper Body Dressing : Sitting;Contact guard assist   Lower Body Dressing: Minimal assistance;Sit to/from stand   Toilet Transfer: Paramedic Details (indicate cue type and reason): from recliner to [facility]         Functional mobility during ADLs: Contact guard assist       Vision   Vision Assessment?: No apparent visual deficits     Perception         Praxis         Pertinent Vitals/Pain Pain Assessment Pain Assessment: No/denies pain     Extremity/Trunk Assessment Upper Extremity  Assessment Upper Extremity Assessment: Generalized weakness   Lower Extremity Assessment Lower Extremity Assessment: Defer to PT evaluation   Cervical / Trunk Assessment Cervical / Trunk Assessment: Kyphotic   Communication Communication Communication: No apparent difficulties Factors Affecting Communication: Reduced clarity of speech (Pt very short of breath making it  difficult to get words out)   Cognition Arousal: Alert Behavior During Therapy: Anxious Cognition: History of cognitive impairments             OT - Cognition Comments: hx of dementia, anxious and nauseated during session and not engaging with therapist.                 Following commands: Impaired Following commands impaired: Follows one step commands with increased time     Cueing  General Comments   Cueing Techniques: Verbal cues;Gestural cues  pt sitting in recliner, reports vomiting upon therapist entry (minimal in cup), and nursing aware.  Assisted pt back to bed and continues to voice I don't feel well Help me feel better, pt vomiting again and NT present to assist pt.   Exercises     Shoulder Instructions      Home Living Family/patient expects to be discharged to:: Private residence Living Arrangements: Children Available Help at Discharge: Family;Available 24 hours/day Type of Home: House Home Access: Stairs to enter [employer] of Steps: 2 Entrance Stairs-Rails: None Home Layout: Two level;Bed/bath upstairs Alternate Level Stairs-Number of Steps: flight Alternate Level Stairs-Rails: Left Bathroom Shower/Tub: Producer, television/film/video: Standard     Home Equipment: Shower seat - built in;BSC/3in1;Cane - quad;Cane - single point;Rolling Environmental consultant (2 wheels);Rollator (4 wheels)   Additional Comments: Son works from home-  home setup from [employer] eval      Prior Functioning/Environment Prior Level of Function : Independent/Modified Independent;Patient poor historian/Family not available (per PT eval)             Mobility Comments: pt reports she amb with rollator at night, RW during the day ADLs Comments: pt reports she requires MOD I for ADL, supervision for showering from DIL; assist for IADL from son/DIL    OT Problem List: Decreased strength;Impaired balance (sitting and/or standing);Decreased cognition;Decreased activity  tolerance;Decreased safety awareness;Decreased knowledge of precautions   OT Treatment/Interventions: Self-care/ADL training;Patient/family education;Therapeutic activities;DME and/or AE instruction;Balance training;Energy conservation      OT Goals(Current goals can be found in the care plan section)   Acute Rehab OT Goals Patient Stated Goal: to feel better OT Goal Formulation: With patient Time For Goal Achievement: 07/20/24 Potential to Achieve Goals: Good   OT Frequency:  Min 2X/week    Co-evaluation              AM-PAC OT 6 Clicks Daily Activity     Outcome Measure Help from another person eating meals?: A Little Help from another person taking care of personal grooming?: A Little Help from another person toileting, which includes using toliet, bedpan, or urinal?: A Lot Help from another person bathing (including washing, rinsing, drying)?: A Lot Help from another person to put on and taking off regular upper body clothing?: A Little Help from another person to put on and taking off regular lower body clothing?: A Lot 6 Click Score: 15   End of Session Nurse Communication: Mobility status;Other (comment) (nausea/vomiting)  Activity Tolerance: Other (comment) (nausea and vomiting) Patient left: with call bell/phone within reach;with bed alarm set;in bed;with nursing/sitter in room  OT Visit Diagnosis: Unsteadiness on feet (R26.81);Muscle weakness (generalized) (  M62.81)                Time: 8846-8794 OT Time Calculation (min): 12 min Charges:  OT General Charges $OT Visit: 1 Visit OT Evaluation $OT Eval Moderate Complexity: 1 Mod  Cindy Huerta, OT Acute Rehabilitation Services Office [phone] Secure Chat Preferred    Cindy Huerta 07/06/2024, 1:37 PM

## 2024-07-07 DIAGNOSIS — R531 Weakness: Secondary | ICD-10-CM | POA: Diagnosis not present

## 2024-07-07 LAB — CBC WITH DIFFERENTIAL/PLATELET
Abs Immature Granulocytes: 0.08 K/uL — ABNORMAL HIGH (ref 0.00–0.07)
Basophils Absolute: 0 K/uL (ref 0.0–0.1)
Basophils Relative: 0 %
Eosinophils Absolute: 0.5 K/uL (ref 0.0–0.5)
Eosinophils Relative: 5 %
HCT: 28.6 % — ABNORMAL LOW (ref 36.0–46.0)
Hemoglobin: 9.8 g/dL — ABNORMAL LOW (ref 12.0–15.0)
Immature Granulocytes: 1 %
Lymphocytes Relative: 20 %
Lymphs Abs: 2.2 K/uL (ref 0.7–4.0)
MCH: 31.2 pg (ref 26.0–34.0)
MCHC: 34.3 g/dL (ref 30.0–36.0)
MCV: 91.1 fL (ref 80.0–100.0)
Monocytes Absolute: 1.1 K/uL — ABNORMAL HIGH (ref 0.1–1.0)
Monocytes Relative: 10 %
Neutro Abs: 7 K/uL (ref 1.7–7.7)
Neutrophils Relative %: 64 %
Platelets: 220 K/uL (ref 150–400)
RBC: 3.14 MIL/uL — ABNORMAL LOW (ref 3.87–5.11)
RDW: 13.9 % (ref 11.5–15.5)
WBC: 10.9 K/uL — ABNORMAL HIGH (ref 4.0–10.5)
nRBC: 0 % (ref 0.0–0.2)

## 2024-07-07 LAB — RENAL FUNCTION PANEL
Albumin: 3 g/dL — ABNORMAL LOW (ref 3.5–5.0)
Anion gap: 10 (ref 5–15)
BUN: 11 mg/dL (ref 8–23)
CO2: 22 mmol/L (ref 22–32)
Calcium: 8.9 mg/dL (ref 8.9–10.3)
Chloride: 104 mmol/L (ref 98–111)
Creatinine, Ser: 1.12 mg/dL — ABNORMAL HIGH (ref 0.44–1.00)
GFR, Estimated: 50 mL/min — ABNORMAL LOW (ref >60)
Glucose, Bld: 86 mg/dL (ref 70–99)
Phosphorus: 3.5 mg/dL (ref 2.5–4.6)
Potassium: 3.9 mmol/L (ref 3.5–5.1)
Sodium: 136 mmol/L (ref 135–145)

## 2024-07-07 LAB — URINE CULTURE: Culture: >=100000 — AB

## 2024-07-07 LAB — MAGNESIUM: Magnesium: 1.4 mg/dL — ABNORMAL LOW (ref 1.7–2.4)

## 2024-07-07 MED ORDER — MAGNESIUM SULFATE 4 GM/100ML IV SOLN
4.0000 g | Freq: Once | INTRAVENOUS | Status: AC
  Administered 2024-07-07: 4 g via INTRAVENOUS
  Filled 2024-07-07: qty 100

## 2024-07-07 MED ORDER — HYDRALAZINE HCL 25 MG PO TABS
25.0000 mg | ORAL_TABLET | Freq: Four times a day (QID) | ORAL | Status: DC | PRN
  Administered 2024-07-08: 25 mg via ORAL
  Filled 2024-07-07: qty 1

## 2024-07-07 MED ORDER — CEPHALEXIN 500 MG PO CAPS
500.0000 mg | ORAL_CAPSULE | Freq: Two times a day (BID) | ORAL | Status: DC
  Administered 2024-07-08 – 2024-07-09 (×3): 500 mg via ORAL
  Filled 2024-07-07 (×3): qty 1

## 2024-07-07 MED ORDER — AMLODIPINE BESYLATE 5 MG PO TABS
5.0000 mg | ORAL_TABLET | Freq: Every day | ORAL | Status: DC
  Administered 2024-07-07 – 2024-07-09 (×3): 5 mg via ORAL
  Filled 2024-07-07 (×3): qty 1

## 2024-07-07 NOTE — Plan of Care (Signed)

## 2024-07-07 NOTE — Plan of Care (Signed)
  Problem: Education: Goal: Knowledge of General Education information will improve Description: Including pain rating scale, medication(s)/side effects and non-pharmacologic comfort measures Outcome: Progressing   Problem: Health Behavior/Discharge Planning: Goal: Ability to manage health-related needs will improve Outcome: Progressing   Problem: Clinical Measurements: Goal: Ability to maintain clinical measurements within normal limits will improve Outcome: Progressing Goal: Will remain free from infection Outcome: Progressing Goal: Diagnostic test results will improve Outcome: Progressing Goal: Respiratory complications will improve Outcome: Progressing Goal: Cardiovascular complication will be avoided Outcome: Progressing   Problem: Activity: Goal: Risk for activity intolerance will decrease Outcome: Progressing   Problem: Nutrition: Goal: Adequate nutrition will be maintained Outcome: Progressing   Problem: Elimination: Goal: Will not experience complications related to bowel motility Outcome: Progressing Goal: Will not experience complications related to urinary retention Outcome: Progressing   Problem: Safety: Goal: Ability to remain free from injury will improve Outcome: Progressing   Problem: Skin Integrity: Goal: Risk for impaired skin integrity will decrease Outcome: Progressing   Problem: Coping: Goal: Level of anxiety will decrease Outcome: Not Progressing   Problem: Pain Managment: Goal: General experience of comfort will improve and/or be controlled Outcome: Not Progressing

## 2024-07-07 NOTE — Progress Notes (Signed)
 PROGRESS NOTE    Cindy Huerta  FMW:[id number] DOB: 10/29/1946 DOA: 07/05/2024 PCP: Glover [name], MD  Outpatient Specialists:     Brief Narrative:  Patient is an 78 year old female with past medical history significant for dementia, hypertension, hyperlipidemia, TIA, GI bleed, GERD, cyclical vomiting, chronic kidney disease stage III, CHF, hypothyroidism, breast cancer, UTI, electrolyte abnormality and acute kidney injury.  Patient was admitted with generalized weakness, dizziness and mild bradycardia.  Patient has been admitted several in the last 2 to 3 months.  Urine culture done on presentation is growing E. coli.  Blood sugars controlled and chemistry reveals BUN of 9, serum creatinine of 1.16 with CO2 of 21 and albumin of 3.1.  CBC reveals hemoglobin of 9.8, hematocrit of 28.6 and MCV of 90.8.  CT head has not revealed any acute findings.  07/06/2024: Patient seen.  No significant history from patient.  Urine cultures growing E. coli.  Will start patient on IV Rocephin .  Follow final urine culture.  07/07/2024: Patient seen alongside patient's nurse.  Patient is slowly improving.  Patient remains quite anxious.  Patient was said to be later confused earlier in the day.  Urine culture has grown pansensitive E. coli.  Antibiotics will be changed to oral Keflex .   Assessment & Plan:   Principal Problem:   Weakness Active Problems:   HLD (hyperlipidemia)   Depression with anxiety   CKD (chronic kidney disease), stage IIIa   GERD (gastroesophageal reflux disease)   History of TIA (transient ischemic attack)   Carcinoma of overlapping sites of left breast in female, estrogen receptor positive (HCC)   History of GI bleed   HTN (hypertension)   Chronic diastolic CHF (congestive heart failure) (HCC)   Dementia (HCC)   Cyclical vomiting   Acute metabolic encephalopathy   UTI secondary to E. coli: - Send sample for blood cultures. - Change IV Rocephin  to Keflex .  E. coli is  pansensitive.    Generalized weakness New bradycardia Deconditioning > Patient presenting with worsening generalized weakness and dizziness. > Patient is significantly weak and does get dizzy on standing, increased risk for falls. > Noted to have what appears to be new bradycardia with heart rate in the 50s in the ED whereas during recent admissions heart rate noted to be in the 70s-90s. > Has had some dysuria and urinalysis shows small leukocytes and rare bacteria.  Urine culture ordered and is pending. > Etiology is most likely deconditioning in the setting of recent recurrent hospitalizations.  While patient does have new bradycardia did not suspect this is playing significant role, could be contributing however.  Will rule out UTI with urine culture as well. - Monitor on telemetry overnight - Hold metoprolol  - PT/OT eval and treat - Recheck TSH (mildly elevated with normal T4 a month ago) 07/06/2024: Patient may be feeling to thrive.   Recurrent dizziness: -Patient is orthostatic.  Systolic blood pressure dropped from 145 to 118 mmHg, and heart rate increased from 61 to 83 bpm.. -Adequate hydration. -Underwent MRI which was negative for central cause.  Volume depletion: - Ensure adequate hydration.  Hypomagnesemia: - Magnesium  of 1.4. - IV magnesium  4 g x 1 dose. - Check renal panel and magnesium  level in the morning.   Recurrent nausea vomiting Resolved.  Hypertension - Continue clonidine , triamterene -hydrochlorothiazide  - Replace home telmisartan  formulary irbesartan  - Holding metoprolol  as above - Will need to confirm if patient is still taking amlodipine  and/or hydralazine  before restarting   Hyperlipidemia - Continue home atorvastatin   History of TIA - Continue home ASA, atorvastatin    GERD - Continue home PPI   CKD 3A > Creatinine stable 1.14 - Trend renal function and electrolytes   Chronic diastolic CHF > Last echo was in July of this year with EF  60-65%, G1 DD. - Not on diuretic   Depression Anxiety Dementia - Continue home fluoxetine  - Continue as needed Ativan    Hypothyroidism - Continue home Synthroid  - Check TSH/T4 as above   History of QTc prolongation - QTc currently normal at 446   History of breast cancer - Noted    DVT prophylaxis: Subcutaneous Lovenox  Code Status: DNR limited Family Communication:  Disposition Plan: Inpatient.   Consultants:  None  Procedures:  None  Antimicrobials:  IV Rocephin .   Subjective: -No new complaints. - Very anxious.  Objective: Vitals:   07/07/24 0416 07/07/24 0737 07/07/24 1152 07/07/24 1546  BP: (!) 140/54 (!) 141/60 (!) 140/60 (!) 147/66  Pulse: (!) 56 (!) 56 63 64  Resp: 16 16 16 18   Temp: 98.8 F (37.1 C) 98.9 F (37.2 C) 98.2 F (36.8 C) 99.2 F (37.3 C)  TempSrc: Oral Oral Oral Oral  SpO2: 100% 100% 100% 100%    Intake/Output Summary (Last 24 hours) at 07/07/2024 1603 Last data filed at 07/07/2024 1543 Gross per 24 hour  Intake 264.86 ml  Output 2700 ml  Net -2435.14 ml   There were no vitals filed for this visit.  Examination:  General exam: Appears calm and comfortable.  Looks much better today.SABRA Respiratory system: Clear to auscultation.  Cardiovascular system: S1 & S2 heard Gastrointestinal system: Abdomen is soft and nontender. Central nervous system: Awake and alert.     Data Reviewed: I have personally reviewed following labs and imaging studies  CBC: Recent Labs  Lab 07/05/24 1116 07/05/24 1124 07/06/24 0343 07/07/24 0254  WBC 9.6  --  9.6 10.9*  NEUTROABS 7.0  --   --  7.0  HGB 9.9* 10.5* 9.8* 9.8*  HCT 29.6* 31.0* 28.6* 28.6*  MCV 92.2  --  90.8 91.1  PLT 254  --  243 220   Basic Metabolic Panel: Recent Labs  Lab 07/05/24 1116 07/05/24 1124 07/06/24 0343 07/07/24 0254  NA 133* 135 136 136  K 4.0 4.2 3.9 3.9  CL 100 100 105 104  CO2 23  --  21* 22  GLUCOSE 98 98 84 86  BUN <5* 4* 9 11  CREATININE 1.14*  1.10* 1.16* 1.12*  CALCIUM  9.4  --  9.1 8.9  MG  --   --   --  1.4*  PHOS  --   --   --  3.5   GFR: Estimated Creatinine Clearance: 37.5 mL/min (A) (by C-G formula based on SCr of 1.12 mg/dL (H)). Liver Function Tests: Recent Labs  Lab 07/05/24 1116 07/06/24 0343 07/07/24 0254  AST 28 23  --   ALT 14 14  --   ALKPHOS 50 46  --   BILITOT 0.5 0.3  --   PROT 6.9 6.4*  --   ALBUMIN 3.3* 3.1* 3.0*   No results for input(s): LIPASE, AMYLASE in the last 168 hours. No results for input(s): AMMONIA in the last 168 hours. Coagulation Profile: Recent Labs  Lab 07/05/24 1116  INR 1.2   Cardiac Enzymes: No results for input(s): CKTOTAL, CKMB, CKMBINDEX, TROPONINI in the last 168 hours. BNP (last 3 results) No results for input(s): PROBNP in the last 8760 hours. HbA1C: No results for input(s): HGBA1C  in the last 72 hours. CBG: Recent Labs  Lab 07/05/24 1022  GLUCAP 112*   Lipid Profile: No results for input(s): CHOL, HDL, LDLCALC, TRIG, CHOLHDL, LDLDIRECT in the last 72 hours. Thyroid  Function Tests: Recent Labs    07/05/24 1116  TSH 15.306*  FREET4 1.25*   Anemia Panel: No results for input(s): VITAMINB12, FOLATE, FERRITIN, TIBC, IRON , RETICCTPCT in the last 72 hours. Urine analysis:    Component Value Date/Time   COLORURINE STRAW (A) 07/05/2024 1227   APPEARANCEUR CLEAR 07/05/2024 1227   LABSPEC 1.004 (L) 07/05/2024 1227   PHURINE 8.0 07/05/2024 1227   GLUCOSEU NEGATIVE 07/05/2024 1227   HGBUR NEGATIVE 07/05/2024 1227   BILIRUBINUR NEGATIVE 07/05/2024 1227   KETONESUR NEGATIVE 07/05/2024 1227   PROTEINUR NEGATIVE 07/05/2024 1227   NITRITE NEGATIVE 07/05/2024 1227   LEUKOCYTESUR SMALL (A) 07/05/2024 1227   Sepsis Labs: @LABRCNTIP (procalcitonin:4,lacticidven:4)  ) Recent Results (from the past 240 hours)  Urine Culture     Status: Abnormal   Collection Time: 07/05/24 11:21 AM   Specimen: Urine, Clean Catch  Result  Value Ref Range Status   Specimen Description URINE, CLEAN CATCH  Final   Special Requests   Final    NONE Performed at [facility], [address] [place], [place] [address]    Culture >=100,000 COLONIES/mL ESCHERICHIA COLI (A)  Final   Report Status 07/07/2024 FINAL  Final   Organism ID, Bacteria ESCHERICHIA COLI (A)  Final      Susceptibility   Escherichia coli - MIC*    AMPICILLIN 8 SENSITIVE Sensitive     CEFAZOLIN  <=4 SENSITIVE Sensitive     CEFEPIME  <=0.12 SENSITIVE Sensitive     CEFTRIAXONE  <=0.25 SENSITIVE Sensitive     CIPROFLOXACIN <=0.25 SENSITIVE Sensitive     GENTAMICIN <=1 SENSITIVE Sensitive     IMIPENEM <=0.25 SENSITIVE Sensitive     NITROFURANTOIN <=16 SENSITIVE Sensitive     TRIMETH/SULFA <=20 SENSITIVE Sensitive     AMPICILLIN/SULBACTAM <=2 SENSITIVE Sensitive     PIP/TAZO <=4 SENSITIVE Sensitive ug/mL    * >=100,000 COLONIES/mL ESCHERICHIA COLI  Culture, blood (Routine X 2) w Reflex to ID Panel     Status: None (Preliminary result)   Collection Time: 07/06/24  3:07 PM   Specimen: BLOOD RIGHT HAND  Result Value Ref Range Status   Specimen Description BLOOD RIGHT HAND  Final   Special Requests   Final    BOTTLES DRAWN AEROBIC AND ANAEROBIC Blood Culture results may not be optimal due to an inadequate volume of blood received in culture bottles   Culture   Final    NO GROWTH < 24 HOURS Performed at [facility], [address], [place], [place] [address]    Report Status PENDING  Incomplete  Culture, blood (Routine X 2) w Reflex to ID Panel     Status: None (Preliminary result)   Collection Time: 07/06/24  3:07 PM   Specimen: BLOOD RIGHT HAND  Result Value Ref Range Status   Specimen Description BLOOD RIGHT HAND  Final   Special Requests   Final    BOTTLES DRAWN AEROBIC AND ANAEROBIC Blood Culture results may not be optimal due to an inadequate volume of blood received in culture bottles   Culture   Final    NO GROWTH < 24 HOURS Performed at  [facility], [address] [place], [place] [address]    Report Status PENDING  Incomplete  Radiology Studies: No results found.       Scheduled Meds:  aspirin  EC  81 mg Oral QHS   atorvastatin   80 mg Oral Daily   [START ON 07/08/2024] cephALEXin   500 mg Oral Q12H   cloNIDine   0.1 mg Oral Daily   enoxaparin  (LOVENOX ) injection  40 mg Subcutaneous Q24H   feeding supplement  237 mL Oral BID BM   FLUoxetine   20 mg Oral Daily   irbesartan   300 mg Oral Daily   levothyroxine   50 mcg Oral QAC breakfast   pantoprazole   40 mg Oral Daily   scopolamine   1 patch Transdermal Q72H   sodium chloride  flush  3 mL Intravenous Q12H   triamterene -hydrochlorothiazide   1 tablet Oral Daily   Continuous Infusions:  sodium chloride  75 mL/hr at 07/07/24 1600   magnesium  sulfate bolus IVPB 4 g (07/07/24 1601)     LOS: 1 day    Time spent: 55 minutes.    Leatrice Chapel, MD  [facility] Pager #: [phone] 7PM-7AM contact night coverage as above

## 2024-07-08 DIAGNOSIS — R531 Weakness: Secondary | ICD-10-CM | POA: Diagnosis not present

## 2024-07-08 MED ORDER — LORAZEPAM 2 MG/ML PO CONC: 0.5000 mg | Freq: Four times a day (QID) | ORAL | Status: DC | PRN

## 2024-07-08 MED ORDER — LORAZEPAM 0.5 MG PO TABS
0.5000 mg | ORAL_TABLET | Freq: Four times a day (QID) | ORAL | Status: DC | PRN
  Administered 2024-07-08 – 2024-07-09 (×4): 0.5 mg via ORAL
  Filled 2024-07-08 (×4): qty 1

## 2024-07-08 NOTE — Plan of Care (Signed)
  Problem: Education: Goal: Knowledge of General Education information will improve Description: Including pain rating scale, medication(s)/side effects and non-pharmacologic comfort measures Outcome: Progressing   Problem: Health Behavior/Discharge Planning: Goal: Ability to manage health-related needs will improve Outcome: Progressing   Problem: Clinical Measurements: Goal: Ability to maintain clinical measurements within normal limits will improve Outcome: Progressing Goal: Will remain free from infection Outcome: Progressing Goal: Diagnostic test results will improve Outcome: Progressing Goal: Respiratory complications will improve Outcome: Progressing Goal: Cardiovascular complication will be avoided Outcome: Progressing   Problem: Nutrition: Goal: Adequate nutrition will be maintained Outcome: Progressing   Problem: Coping: Goal: Level of anxiety will decrease Outcome: Progressing   Problem: Pain Managment: Goal: General experience of comfort will improve and/or be controlled Outcome: Progressing   Problem: Safety: Goal: Ability to remain free from injury will improve Outcome: Progressing   Problem: Skin Integrity: Goal: Risk for impaired skin integrity will decrease Outcome: Progressing

## 2024-07-08 NOTE — Progress Notes (Signed)
 PROGRESS NOTE    Cindy Huerta  FMW:[id number] DOB: 12-04-1946 DOA: 07/05/2024 PCP: Glover [name], MD  Outpatient Specialists:     Brief Narrative:  Patient is an 78 year old female with past medical history significant for dementia, hypertension, hyperlipidemia, TIA, GI bleed, GERD, cyclical vomiting, chronic kidney disease stage III, CHF, hypothyroidism, breast cancer, UTI, electrolyte abnormality and acute kidney injury.  Patient was admitted with generalized weakness, dizziness and mild bradycardia.  Patient has been admitted several in the last 2 to 3 months.  Urine culture done on presentation is growing E. coli.  Blood sugars controlled and chemistry reveals BUN of 9, serum creatinine of 1.16 with CO2 of 21 and albumin of 3.1.  CBC reveals hemoglobin of 9.8, hematocrit of 28.6 and MCV of 90.8.  CT head has not revealed any acute findings.  07/06/2024: Patient seen.  No significant history from patient.  Urine cultures growing E. coli.  Will start patient on IV Rocephin .  Follow final urine culture.  07/07/2024: Patient seen alongside patient's nurse.  Patient is slowly improving.  Patient remains quite anxious.  Patient was said to be later confused earlier in the day.  Urine culture has grown pansensitive E. coli.  Antibiotics will be changed to oral Keflex .  07/08/2024: Stable.  No new complaints.  Continues to improve gradually.  Gets anxious easily.  Assessment & Plan:   Principal Problem:   Weakness Active Problems:   HLD (hyperlipidemia)   Depression with anxiety   CKD (chronic kidney disease), stage IIIa   GERD (gastroesophageal reflux disease)   History of TIA (transient ischemic attack)   Carcinoma of overlapping sites of left breast in female, estrogen receptor positive (HCC)   History of GI bleed   HTN (hypertension)   Chronic diastolic CHF (congestive heart failure) (HCC)   Dementia (HCC)   Cyclical vomiting   Acute metabolic encephalopathy   UTI secondary to E.  coli: - Urine culture has grown pansensitive E. coli. - Continue Keflex .   Generalized weakness New bradycardia Deconditioning > Patient presenting with worsening generalized weakness and dizziness. > Patient is significantly weak and does get dizzy on standing, increased risk for falls. > Noted to have what appears to be new bradycardia with heart rate in the 50s in the ED whereas during recent admissions heart rate noted to be in the 70s-90s. > Has had some dysuria and urinalysis shows small leukocytes and rare bacteria.  Urine culture ordered and is pending. > Etiology is most likely deconditioning in the setting of recent recurrent hospitalizations.  While patient does have new bradycardia did not suspect this is playing significant role, could be contributing however.  Will rule out UTI with urine culture as well. - Monitor on telemetry overnight - Hold metoprolol  - PT/OT eval and treat 07/08/2024: Patient is slowly improving.   Recurrent dizziness: -Patient was orthostatic.  Systolic blood pressure dropped from 145 to 118 mmHg, and heart rate increased from 61 to 83 bpm.. -Adequate hydration. -MRI brain was negative for central cause.  Volume depletion: - Adequate hydration.   Hypomagnesemia: - Magnesium  of 1.4. - IV magnesium  4 g x 1 dose. - Check renal panel and magnesium  level in the morning.   Recurrent nausea vomiting Resolved.  Hypertension - Continue amlodipine  5 Mg p.o. once daily and irbesartan  300 Mg p.o. once daily  -Blood pressure control has improved significantly   Hyperlipidemia - Continue home atorvastatin    History of TIA - Continue home ASA, atorvastatin    GERD -  Continue home PPI   CKD 3A > Creatinine stable 1.14 - Trend renal function and electrolytes   Chronic diastolic CHF > Last echo was in July of this year with EF 60-65%, G1 DD. - Not on diuretic   Depression Anxiety Dementia - Continue home fluoxetine  - Continue as needed  Ativan    Hypothyroidism - Continue home Synthroid  - Check TSH/T4 as above   History of QTc prolongation - QTc currently normal at 446   History of breast cancer - Noted    DVT prophylaxis: Subcutaneous Lovenox  Code Status: DNR limited Family Communication:  Disposition Plan: Inpatient.   Consultants:  None  Procedures:  None  Antimicrobials:  IV Rocephin .   Subjective: -No new complaints. - Gets anxious easily.     Objective: Vitals:   07/08/24 0334 07/08/24 0726 07/08/24 1248 07/08/24 1553  BP: (!) 137/54 138/66 (!) 156/63 (!) 147/72  Pulse: 65 71 73 80  Resp: 12  18 18   Temp: 98.2 F (36.8 C) 98.8 F (37.1 C) 98.3 F (36.8 C) 99 F (37.2 C)  TempSrc: Oral Oral  Oral  SpO2: 99% 100% 100% 100%    Intake/Output Summary (Last 24 hours) at 07/08/2024 1843 Last data filed at 07/08/2024 1832 Gross per 24 hour  Intake 2516.29 ml  Output 2700 ml  Net -183.71 ml   There were no vitals filed for this visit.  Examination:  General exam: Appears calm and comfortable.  Looks much better today.SABRA Respiratory system: Clear to auscultation.  Cardiovascular system: S1 & S2 heard Gastrointestinal system: Abdomen is soft and nontender. Central nervous system: Awake and alert.     Data Reviewed: I have personally reviewed following labs and imaging studies  CBC: Recent Labs  Lab 07/05/24 1116 07/05/24 1124 07/06/24 0343 07/07/24 0254  WBC 9.6  --  9.6 10.9*  NEUTROABS 7.0  --   --  7.0  HGB 9.9* 10.5* 9.8* 9.8*  HCT 29.6* 31.0* 28.6* 28.6*  MCV 92.2  --  90.8 91.1  PLT 254  --  243 220   Basic Metabolic Panel: Recent Labs  Lab 07/05/24 1116 07/05/24 1124 07/06/24 0343 07/07/24 0254  NA 133* 135 136 136  K 4.0 4.2 3.9 3.9  CL 100 100 105 104  CO2 23  --  21* 22  GLUCOSE 98 98 84 86  BUN <5* 4* 9 11  CREATININE 1.14* 1.10* 1.16* 1.12*  CALCIUM  9.4  --  9.1 8.9  MG  --   --   --  1.4*  PHOS  --   --   --  3.5   GFR: Estimated Creatinine  Clearance: 37.5 mL/min (A) (by C-G formula based on SCr of 1.12 mg/dL (H)). Liver Function Tests: Recent Labs  Lab 07/05/24 1116 07/06/24 0343 07/07/24 0254  AST 28 23  --   ALT 14 14  --   ALKPHOS 50 46  --   BILITOT 0.5 0.3  --   PROT 6.9 6.4*  --   ALBUMIN 3.3* 3.1* 3.0*   No results for input(s): LIPASE, AMYLASE in the last 168 hours. No results for input(s): AMMONIA in the last 168 hours. Coagulation Profile: Recent Labs  Lab 07/05/24 1116  INR 1.2   Cardiac Enzymes: No results for input(s): CKTOTAL, CKMB, CKMBINDEX, TROPONINI in the last 168 hours. BNP (last 3 results) No results for input(s): PROBNP in the last 8760 hours. HbA1C: No results for input(s): HGBA1C in the last 72 hours. CBG: Recent Labs  Lab 07/05/24  1022  GLUCAP 112*   Lipid Profile: No results for input(s): CHOL, HDL, LDLCALC, TRIG, CHOLHDL, LDLDIRECT in the last 72 hours. Thyroid  Function Tests: No results for input(s): TSH, T4TOTAL, FREET4, T3FREE, THYROIDAB in the last 72 hours.  Anemia Panel: No results for input(s): VITAMINB12, FOLATE, FERRITIN, TIBC, IRON , RETICCTPCT in the last 72 hours. Urine analysis:    Component Value Date/Time   COLORURINE STRAW (A) 07/05/2024 1227   APPEARANCEUR CLEAR 07/05/2024 1227   LABSPEC 1.004 (L) 07/05/2024 1227   PHURINE 8.0 07/05/2024 1227   GLUCOSEU NEGATIVE 07/05/2024 1227   HGBUR NEGATIVE 07/05/2024 1227   BILIRUBINUR NEGATIVE 07/05/2024 1227   KETONESUR NEGATIVE 07/05/2024 1227   PROTEINUR NEGATIVE 07/05/2024 1227   NITRITE NEGATIVE 07/05/2024 1227   LEUKOCYTESUR SMALL (A) 07/05/2024 1227   Sepsis Labs: @LABRCNTIP (procalcitonin:4,lacticidven:4)  ) Recent Results (from the past 240 hours)  Urine Culture     Status: Abnormal   Collection Time: 07/05/24 11:21 AM   Specimen: Urine, Clean Catch  Result Value Ref Range Status   Specimen Description URINE, CLEAN CATCH  Final   Special Requests    Final    NONE Performed at [facility], [address] [place], [place] [address]    Culture >=100,000 COLONIES/mL ESCHERICHIA COLI (A)  Final   Report Status 07/07/2024 FINAL  Final   Organism ID, Bacteria ESCHERICHIA COLI (A)  Final      Susceptibility   Escherichia coli - MIC*    AMPICILLIN 8 SENSITIVE Sensitive     CEFAZOLIN  <=4 SENSITIVE Sensitive     CEFEPIME  <=0.12 SENSITIVE Sensitive     CEFTRIAXONE  <=0.25 SENSITIVE Sensitive     CIPROFLOXACIN <=0.25 SENSITIVE Sensitive     GENTAMICIN <=1 SENSITIVE Sensitive     IMIPENEM <=0.25 SENSITIVE Sensitive     NITROFURANTOIN <=16 SENSITIVE Sensitive     TRIMETH/SULFA <=20 SENSITIVE Sensitive     AMPICILLIN/SULBACTAM <=2 SENSITIVE Sensitive     PIP/TAZO <=4 SENSITIVE Sensitive ug/mL    * >=100,000 COLONIES/mL ESCHERICHIA COLI  Culture, blood (Routine X 2) w Reflex to ID Panel     Status: None (Preliminary result)   Collection Time: 07/06/24  3:07 PM   Specimen: BLOOD RIGHT HAND  Result Value Ref Range Status   Specimen Description BLOOD RIGHT HAND  Final   Special Requests   Final    BOTTLES DRAWN AEROBIC AND ANAEROBIC Blood Culture results may not be optimal due to an inadequate volume of blood received in culture bottles   Culture   Final    NO GROWTH < 24 HOURS Performed at [facility], [address], [place], [place] [address]    Report Status PENDING  Incomplete  Culture, blood (Routine X 2) w Reflex to ID Panel     Status: None (Preliminary result)   Collection Time: 07/06/24  3:07 PM   Specimen: BLOOD RIGHT HAND  Result Value Ref Range Status   Specimen Description BLOOD RIGHT HAND  Final   Special Requests   Final    BOTTLES DRAWN AEROBIC AND ANAEROBIC Blood Culture results may not be optimal due to an inadequate volume of blood received in culture bottles   Culture   Final    NO GROWTH < 24 HOURS Performed at [facility], [address], [place], [place] [address]    Report Status PENDING   Incomplete         Radiology Studies: No results found.  Scheduled Meds:  amLODipine   5 mg Oral Daily   aspirin  EC  81 mg Oral QHS   atorvastatin   80 mg Oral Daily   cephALEXin   500 mg Oral Q12H   enoxaparin  (LOVENOX ) injection  40 mg Subcutaneous Q24H   feeding supplement  237 mL Oral BID BM   FLUoxetine   20 mg Oral Daily   irbesartan   300 mg Oral Daily   levothyroxine   50 mcg Oral QAC breakfast   pantoprazole   40 mg Oral Daily   scopolamine   1 patch Transdermal Q72H   sodium chloride  flush  3 mL Intravenous Q12H   Continuous Infusions:  sodium chloride  75 mL/hr at 07/08/24 1843     LOS: 2 days    Time spent: 35 minutes.    Leatrice Chapel, MD  [facility] Pager #: [phone] 7PM-7AM contact night coverage as above

## 2024-07-08 NOTE — Plan of Care (Signed)

## 2024-07-09 DIAGNOSIS — R1115 Cyclical vomiting syndrome unrelated to migraine: Secondary | ICD-10-CM | POA: Diagnosis not present

## 2024-07-09 DIAGNOSIS — R531 Weakness: Secondary | ICD-10-CM | POA: Diagnosis not present

## 2024-07-09 DIAGNOSIS — K219 Gastro-esophageal reflux disease without esophagitis: Secondary | ICD-10-CM | POA: Diagnosis not present

## 2024-07-09 DIAGNOSIS — G9341 Metabolic encephalopathy: Secondary | ICD-10-CM

## 2024-07-09 DIAGNOSIS — N1831 Chronic kidney disease, stage 3a: Secondary | ICD-10-CM | POA: Diagnosis not present

## 2024-07-09 DIAGNOSIS — F418 Other specified anxiety disorders: Secondary | ICD-10-CM

## 2024-07-09 DIAGNOSIS — Z170 Estrogen receptor positive status [ER+]: Secondary | ICD-10-CM

## 2024-07-09 DIAGNOSIS — C50812 Malignant neoplasm of overlapping sites of left female breast: Secondary | ICD-10-CM

## 2024-07-09 DIAGNOSIS — Z8719 Personal history of other diseases of the digestive system: Secondary | ICD-10-CM

## 2024-07-09 LAB — RENAL FUNCTION PANEL
Albumin: 3.2 g/dL — ABNORMAL LOW (ref 3.5–5.0)
Anion gap: 9 (ref 5–15)
BUN: 6 mg/dL — ABNORMAL LOW (ref 8–23)
CO2: 20 mmol/L — ABNORMAL LOW (ref 22–32)
Calcium: 9 mg/dL (ref 8.9–10.3)
Chloride: 107 mmol/L (ref 98–111)
Creatinine, Ser: 0.97 mg/dL (ref 0.44–1.00)
GFR, Estimated: 59 mL/min — ABNORMAL LOW (ref >60)
Glucose, Bld: 101 mg/dL — ABNORMAL HIGH (ref 70–99)
Phosphorus: 3.9 mg/dL (ref 2.5–4.6)
Potassium: 3.4 mmol/L — ABNORMAL LOW (ref 3.5–5.1)
Sodium: 136 mmol/L (ref 135–145)

## 2024-07-09 LAB — MAGNESIUM: Magnesium: 1.6 mg/dL — ABNORMAL LOW (ref 1.7–2.4)

## 2024-07-09 MED ORDER — MAGNESIUM SULFATE 2 GM/50ML IV SOLN
2.0000 g | Freq: Once | INTRAVENOUS | Status: AC
  Administered 2024-07-09: 2 g via INTRAVENOUS
  Filled 2024-07-09: qty 50

## 2024-07-09 MED ORDER — SENNOSIDES-DOCUSATE SODIUM 8.6-50 MG PO TABS: 1.0000 | ORAL_TABLET | Freq: Two times a day (BID) | ORAL | Status: DC | PRN

## 2024-07-09 MED ORDER — LORAZEPAM 0.5 MG PO TABS: 0.5000 mg | ORAL_TABLET | Freq: Two times a day (BID) | ORAL | 0 refills | Status: DC | PRN

## 2024-07-09 MED ORDER — POLYETHYLENE GLYCOL 3350 17 GM/SCOOP PO POWD: 17.0000 g | Freq: Two times a day (BID) | ORAL | Status: DC | PRN

## 2024-07-09 MED ORDER — CEPHALEXIN 500 MG PO CAPS: 500.0000 mg | ORAL_CAPSULE | Freq: Two times a day (BID) | ORAL | Status: AC

## 2024-07-09 MED ORDER — POTASSIUM CHLORIDE CRYS ER 20 MEQ PO TBCR
60.0000 meq | EXTENDED_RELEASE_TABLET | Freq: Once | ORAL | Status: AC
  Administered 2024-07-09: 60 meq via ORAL
  Filled 2024-07-09: qty 3

## 2024-07-09 NOTE — Progress Notes (Signed)
 Occupational Therapy Treatment Patient Details Name: Cindy Huerta MRN: [id number] DOB: 1946-04-16 Today's Date: 07/09/2024   History of present illness Pt is a 78 y.o. F who presents 07/05/2024 with dizziness and weakness. CT head negative for acute abnormality. Significant PMH: hypertension, hyperlipidemia, TIA, GI bleed, GERD, cervical vomiting, CKD 3A, CHF, dementia, DNR, depression, anxiety, hypothyroidism, breast cancer.   OT comments  Patient in recliner, agreeable to OT.  Contact guard for transfers and mobility in room using RW, contact guard for LB dressing and close supervision for grooming standing at sink.  Utilized music to calm anxiety.  Continue to recommend <3hrs/day inpatient setting to optimize tolerance and independence.       If plan is discharge home, recommend the following:  Assistance with cooking/housework;Supervision due to cognitive status;A little help with walking and/or transfers;Direct supervision/assist for medications management;Direct supervision/assist for financial management;Help with stairs or ramp for entrance;A little help with bathing/dressing/bathroom   Equipment Recommendations  Other (comment) (defer)    Recommendations for Other Services      Precautions / Restrictions Precautions Precautions: Fall Recall of Precautions/Restrictions: Impaired Restrictions Weight Bearing Restrictions Per Provider Order: No       Mobility Bed Mobility               General bed mobility comments: OOB in recliner    Transfers Overall transfer level: Needs assistance Equipment used: Rolling walker (2 wheels) Transfers: Sit to/from Stand Sit to Stand: Contact guard assist           General transfer comment: for safety, balance     Balance Overall balance assessment: Needs assistance Sitting-balance support: Feet supported Sitting balance-Leahy Scale: Fair     Standing balance support: No upper extremity supported, During functional  activity, Bilateral upper extremity supported Standing balance-Leahy Scale: Poor Standing balance comment: relies on RW dynamically, grooms at sink with close supervision                           ADL either performed or assessed with clinical judgement   ADL Overall ADL's : Needs assistance/impaired     Grooming: Supervision/safety;Wash/dry hands;Wash/dry face;Standing               Lower Body Dressing: Contact guard assist;Sit to/from stand   Toilet Transfer: Contact guard assist;Ambulation;Rolling walker (2 wheels)           Functional mobility during ADLs: Contact guard assist;Rolling walker (2 wheels)      Extremity/Trunk Assessment Upper Extremity Assessment Upper Extremity Assessment: Generalized weakness   Lower Extremity Assessment Lower Extremity Assessment: Defer to PT evaluation        Vision       Perception     Praxis     Communication Communication Communication: No apparent difficulties Factors Affecting Communication: Reduced clarity of speech   Cognition Arousal: Alert Behavior During Therapy: Anxious Cognition: History of cognitive impairments             OT - Cognition Comments: hx of dementia, follows simple commands                 Following commands: Impaired Following commands impaired: Follows one step commands with increased time      Cueing   Cueing Techniques: Verbal cues  Exercises      Shoulder Instructions       General Comments      Pertinent Vitals/ Pain       Pain Assessment Pain  Assessment: No/denies pain  Home Living                                          Prior Functioning/Environment              Frequency  Min 2X/week        Progress Toward Goals  OT Goals(current goals can now be found in the care plan section)  Progress towards OT goals: Progressing toward goals  Acute Rehab OT Goals Patient Stated Goal: feel better OT Goal Formulation:  With patient Time For Goal Achievement: 07/20/24 Potential to Achieve Goals: Good  Plan      Co-evaluation                 AM-PAC OT 6 Clicks Daily Activity     Outcome Measure   Help from another person eating meals?: A Little Help from another person taking care of personal grooming?: A Little Help from another person toileting, which includes using toliet, bedpan, or urinal?: A Little Help from another person bathing (including washing, rinsing, drying)?: A Little Help from another person to put on and taking off regular upper body clothing?: A Little Help from another person to put on and taking off regular lower body clothing?: A Little 6 Click Score: 18    End of Session Equipment Utilized During Treatment: Rolling walker (2 wheels)  OT Visit Diagnosis: Unsteadiness on feet (R26.81);Muscle weakness (generalized) (M62.81)   Activity Tolerance Patient tolerated treatment well   Patient Left in chair;with call bell/phone within reach;with chair alarm set   Nurse Communication Mobility status        Time: 8896-8880 OT Time Calculation (min): 16 min  Charges: OT General Charges $OT Visit: 1 Visit OT Treatments $Self Care/Home Management : 8-22 mins  Etta NOVAK, OT Acute Rehabilitation Services Office [phone] Secure Chat Preferred    Etta GORMAN Hope 07/09/2024, 1:51 PM

## 2024-07-09 NOTE — Progress Notes (Signed)
 Physical Therapy Treatment Patient Details Name: Cindy Huerta MRN: [id number] DOB: 1946-09-29 Today's Date: 07/09/2024   History of Present Illness Pt is a 78 y.o. F who presents 07/05/2024 with dizziness and weakness. CT head negative for acute abnormality. Significant PMH: hypertension, hyperlipidemia, TIA, GI bleed, GERD, cervical vomiting, CKD 3A, CHF, dementia, DNR, depression, anxiety, hypothyroidism, breast cancer.    PT Comments  Pt making good gains towards her physical therapy goals this session; demonstrates decreased anxious behavior and increased ambulation distance. Pt just positive for orthostatic hypotension from sitting to standing (drop from 164 to 144 systolic; see flowsheet), however she denies dizziness. RN present to don TED hose post session when arrived to unit. Patient will benefit from continued inpatient follow up therapy, <3 hours/day.    If plan is discharge home, recommend the following: A little help with walking and/or transfers;A little help with bathing/dressing/bathroom;Supervision due to cognitive status   Can travel by private vehicle     Yes  Equipment Recommendations  Wheelchair (measurements PT);Wheelchair cushion (measurements PT)    Recommendations for Other Services       Precautions / Restrictions Precautions Precautions: Fall Restrictions Weight Bearing Restrictions Per Provider Order: No     Mobility  Bed Mobility Overal bed mobility: Needs Assistance Bed Mobility: Supine to Sit     Supine to sit: Supervision          Transfers Overall transfer level: Needs assistance Equipment used: Rolling walker (2 wheels) Transfers: Sit to/from Stand Sit to Stand: Contact guard assist, Min assist           General transfer comment: Light assist provided to initially steady    Ambulation/Gait Ambulation/Gait assistance: Contact guard assist Gait Distance (Feet): 40 Feet Assistive device: Rolling walker (2 wheels) Gait  Pattern/deviations: Step-through pattern, Decreased stride length Gait velocity: decreased Gait velocity interpretation: <1.8 ft/sec, indicate of risk for recurrent falls   General Gait Details: CGA for safety, chair follow utilized and pt able to verbalize fatigue and need to sit   Stairs             Wheelchair Mobility     Tilt Bed    Modified Rankin (Stroke Patients Only)       Balance Overall balance assessment: Needs assistance Sitting-balance support: Feet supported Sitting balance-Leahy Scale: Fair     Standing balance support: During functional activity, Bilateral upper extremity supported Standing balance-Leahy Scale: Poor                              Communication Communication Communication: No apparent difficulties Factors Affecting Communication: Reduced clarity of speech  Cognition Arousal: Alert Behavior During Therapy: Anxious   PT - Cognitive impairments: History of cognitive impairments                         Following commands: Impaired Following commands impaired: Follows one step commands with increased time    Cueing Cueing Techniques: Verbal cues  Exercises      General Comments        Pertinent Vitals/Pain Pain Assessment Pain Assessment: No/denies pain    Home Living                          Prior Function            PT Goals (current goals can now be found in the care plan  section) Acute Rehab PT Goals Patient Stated Goal: to feel better Potential to Achieve Goals: Fair Progress towards PT goals: Progressing toward goals    Frequency    Min 2X/week      PT Plan      Co-evaluation              AM-PAC PT 6 Clicks Mobility   Outcome Measure  Help needed turning from your back to your side while in a flat bed without using bedrails?: A Little Help needed moving from lying on your back to sitting on the side of a flat bed without using bedrails?: A Little Help needed  moving to and from a bed to a chair (including a wheelchair)?: A Little Help needed standing up from a chair using your arms (e.g., wheelchair or bedside chair)?: A Little Help needed to walk in hospital room?: A Little Help needed climbing 3-5 steps with a railing? : Total 6 Click Score: 16    End of Session Equipment Utilized During Treatment: Gait belt Activity Tolerance: Patient tolerated treatment well Patient left: in chair;with call bell/phone within reach;with chair alarm set Nurse Communication: Mobility status PT Visit Diagnosis: Muscle weakness (generalized) (M62.81);Other abnormalities of gait and mobility (R26.89);Dizziness and giddiness (R42)     Time: 1030-1059 PT Time Calculation (min) (ACUTE ONLY): 29 min  Charges:    $Therapeutic Activity: 23-37 mins PT General Charges $$ ACUTE PT VISIT: 1 Visit                     Aleck Daring, PT, DPT Acute Rehabilitation Services Office [phone]    [name] 07/09/2024, 11:40 AM

## 2024-07-09 NOTE — Plan of Care (Signed)

## 2024-07-09 NOTE — Discharge Summary (Signed)
 Physician Discharge Summary  Cindy Huerta FMW:[id number] DOB: 10-27-1946 DOA: 07/05/2024  PCP: Glover [name], MD  Admit date: 07/05/2024 Discharge date: 07/09/24  Admitted From: Home Disposition: SNF Recommendations for Outpatient Follow-up:   Reassess blood pressure, CMP and CBC at follow-up Please follow up on the following pending results: None   Discharge Condition: Stable CODE STATUS: DNR Diet Orders (From admission, onward)     Start     Ordered   07/09/24 0000  Diet - low sodium heart healthy        07/09/24 0959   07/05/24 1600  Diet regular Room service appropriate? Yes; Fluid consistency: Thin  Diet effective now       Question Answer Comment  Room service appropriate? Yes   Fluid consistency: Thin      07/05/24 1426              Hospital course 78 year old F with PMH of dementia, HFpEF, TIA, CKD-3A, breast cancer, HTN, HLD, hypothyroidism and recurrent hospitalization presenting with generalized weakness, dizziness and bradycardia, and admitted with working diagnosis of urinary tract infection, generalized weakness, bradycardia and physical deconditioning.  Fifth hospital admission in about 2 months.  In ED, HR in 50s.  Other vital stable. Cr 1.16 (above baseline.  Hgb 9.8.  Wise, CMP and CBC without significant finding.  TSH 15.3.  Free T41.25.  UA without significant finding.  CT head without acute finding.  Patient received IV ceftriaxone  for possible UTI.  Urine culture sent, and admitted for further care.  The next day, orthostatic vitals positive.  Patient was on multiple antihypertensive meds and diuretics.    Urine culture with pansensitive E. coli.  Antibiotic de-escalated to p.o. Keflex  to complete treatment course..   Therapy recommended SNF.  See individual problem list below for more.   Problems addressed during this hospitalization UTI secondary to E. coli: Urine culture with pansensitive E. coli. -Ceftriaxone  8/1>>> p.o. Keflex   8/3-8/7.  Generalized weakness/physical deconditioning/dizziness: Multifactorial including orthostatic hypotension, bradycardia, dehydration, UTI and polypharmacy.  No focal neurodeficit.  CT head without acute finding. -Treat UTI as above.  Hydrated for dehydration. -Adjusted cardiac and antihypertensive meds for bradycardia and orthostatic hypotension. -Discontinue meclizine  and scopolamine  patch -Unfortunately not able to stop Ativan  due to significant anxiety.  - PT/OT recommended SNF.  Recurrent dizziness/orthostatic hypotension: Could be due to multiple antihypertensive meds, dehydration, diuretics and polypharmacy. -Discontinued Maxide, clonidine , Toprol -XL and hydralazine . -Discontinued meclizine  and scopolamine  patch -TED hose -Fall precaution  Hypomagnesemia: Replenished prior to discharge.   Recurrent nausea vomiting: Resolved.  Abdominal exam benign   Hypertension: BP within acceptable range. -Continue home amlodipine  and Benicar. -Clonidine , hydralazine , metoprolol  and Maxide held on admission and discontinued on discharge   Hyperlipidemia - Continue home atorvastatin    History of TIA - Continue home ASA, atorvastatin    GERD - Continue home PPI   CKD 3A: Creatinine improved   Chronic diastolic CHF appears euvolemic.  TTE in 06/2023 with LVEF of 60 to 65% and G1 DD.  Anxiety/depression: Appears anxious. -Continue home Ativan  although not a great choice and elderly female with dementia. - Continue home Prozac .  Dementia without behavioral disturbance -Reorientation and delirium precaution  Hypothyroidism: TSH 15.  Free T4 slightly up at 1.24.  Euthyroid sick syndrome - Recheck thyroid  panel 4 to 6 weeks -Continue home Synthroid    History of QTc prolongation: Resolved.   History of breast cancer in remission.  There is no height or weight on file to calculate BMI.  Consultations: None  Time spent 35  minutes  Vital signs Vitals:    07/08/24 2205 07/08/24 2356 07/09/24 0259 07/09/24 0734  BP: (!) 155/72 (!) 159/65 (!) 154/70 (!) 143/62  Pulse:  71 80 89  Temp:  98.8 F (37.1 C) 99 F (37.2 C) 98.1 F (36.7 C)  Resp:  18 18 20   SpO2:  100% 100% 100%  TempSrc:  Oral Oral      Discharge exam  GENERAL: No apparent distress.  Nontoxic. HEENT: MMM.  Vision and hearing grossly intact.  NECK: Supple.  No apparent JVD.  RESP:  No IWOB.  Fair aeration bilaterally. CVS:  RRR. Heart sounds normal.  ABD/GI/GU: BS+. Abd soft, NTND.  MSK/EXT:  Moves extremities. No apparent deformity. No edema.  SKIN: no apparent skin lesion or wound NEURO: Awake and alert. Oriented x 4.  Follows commands.  No apparent focal neuro deficit. PSYCH: Appears anxious.  Discharge Instructions Discharge Instructions     Diet - low sodium heart healthy   Complete by: As directed    Increase activity slowly   Complete by: As directed       Allergies as of 07/09/2024       Reactions   Latex Rash   Novocain [procaine] Other (See Comments)   Unsure - told by DDS not to let anyone give it to her  Confusion   Zestril [lisinopril] Cough   Benadryl  [diphenhydramine ] Other (See Comments)   Jitteriness Agitation   Biaxin [clarithromycin] Other (See Comments)   Confusion   Roxicodone [oxycodone] Nausea And Vomiting, Anxiety        Medication List     STOP taking these medications    cloNIDine  0.1 MG tablet Commonly known as: CATAPRES    hydrALAZINE  25 MG tablet Commonly known as: APRESOLINE    magnesium  oxide 400 MG tablet Commonly known as: MAG-OX   meclizine  12.5 MG tablet Commonly known as: ANTIVERT    metoprolol  succinate 50 MG 24 hr tablet Commonly known as: TOPROL -XL   scopolamine  1 MG/3DAYS Commonly known as: TRANSDERM-SCOP   triamterene -hydrochlorothiazide  37.5-25 MG tablet Commonly known as: MAXZIDE -25       TAKE these medications    acetaminophen  500 MG tablet Commonly known as: TYLENOL  Take 500-1,000 mg  by mouth daily as needed for moderate pain (pain score 4-6) or mild pain (pain score 1-3).   amLODipine  10 MG tablet Commonly known as: NORVASC  Take 1 tablet (10 mg total) by mouth daily.   Artificial Tears 0.1-0.3 % Soln Generic drug: Dextran 70-Hypromellose Place 1 drop into both eyes daily as needed for dry eyes.   aspirin  EC 81 MG tablet Take 1 tablet (81 mg total) by mouth daily. RESTART 48HRS AFTER DISCHARGE What changed:  when to take this additional instructions   atorvastatin  80 MG tablet Commonly known as: LIPITOR  Take 80 mg by mouth daily.   cephALEXin  500 MG capsule Commonly known as: KEFLEX  Take 1 capsule (500 mg total) by mouth every 12 (twelve) hours for 3 days.   feeding supplement Liqd Take 237 mLs by mouth 2 (two) times daily between meals.   ferrous sulfate  325 (65 FE) MG EC tablet Take 325 mg by mouth at bedtime.   FLUoxetine  20 MG capsule Commonly known as: PROZAC  Take 20 mg by mouth daily.   fluticasone  50 MCG/ACT nasal spray Commonly known as: FLONASE  Place 1 spray into both nostrils daily as needed for allergies or rhinitis.   levothyroxine  50 MCG tablet Commonly known as: SYNTHROID  Take 50 mcg  by mouth daily before breakfast.   LORazepam  0.5 MG tablet Commonly known as: ATIVAN  Take 1 tablet (0.5 mg total) by mouth 2 (two) times daily as needed for anxiety (shaking).   ondansetron  4 MG disintegrating tablet Commonly known as: ZOFRAN -ODT Take 1 tablet (4 mg total) by mouth every 8 (eight) hours as needed for nausea or vomiting.   pantoprazole  40 MG tablet Commonly known as: PROTONIX  Take 1 tablet (40 mg total) by mouth daily.   polyethylene glycol powder 17 GM/SCOOP powder Commonly known as: MiraLax  Take 17 g by mouth 2 (two) times daily as needed for moderate constipation.   senna-docusate 8.6-50 MG tablet Commonly known as: Senokot-S Take 1 tablet by mouth 2 (two) times daily between meals as needed for mild constipation.    telmisartan  80 MG tablet Commonly known as: MICARDIS  Take 80 mg by mouth daily.   trolamine salicylate 10 % cream Commonly known as: ASPERCREME Apply 1 Application topically as needed for muscle pain.   VITAMIN D -3 PO Take 1 capsule by mouth at bedtime.         Procedures/Studies:   CT HEAD WO CONTRAST Result Date: 07/05/2024 CLINICAL DATA:  Provided history: Stroke, follow-up. Syncope/presyncope, cerebrovascular cause suspected. EXAM: CT HEAD WITHOUT CONTRAST TECHNIQUE: Contiguous axial images were obtained from the base of the skull through the vertex without intravenous contrast. RADIATION DOSE REDUCTION: This exam was performed according to the departmental dose-optimization program which includes automated exposure control, adjustment of the mA and/or kV according to patient size and/or use of iterative reconstruction technique. COMPARISON:  Head CT 06/12/2024.  Brain MRI 05/28/2024. FINDINGS: Brain: Generalized cerebral atrophy. Patchy and ill-defined hypoattenuation within the cerebral white matter, nonspecific but compatible with moderate-to-advanced chronic small vessel ischemic disease. 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 There is no acute intracranial hemorrhage. No demarcated cortical infarct. No extra-axial fluid collection. No evidence of an intracranial mass. No midline shift. Vascular: No hyperdense vessel.  Atherosclerotic calcifications. Skull: No calvarial fracture or aggressive osseous lesion. Sinuses/Orbits: No mass or acute finding within the imaged orbits. No significant paranasal sinus disease. IMPRESSION: 1. No evidence of an acute intracranial abnormality. 2. Parenchymal atrophy and chronic small vessel ischemic disease. This includes an unchanged chronic lacunar infarct within the right caudate nucleus. Electronically Signed   By: Rockey Childs D.O.   On: 07/05/2024 10:53   CT CHEST ABDOMEN PELVIS W CONTRAST Result Date:  06/25/2024 CLINICAL DATA:  Vomiting and dizziness.  Left-sided pain.  Sepsis. EXAM: CT CHEST, ABDOMEN, AND PELVIS WITH CONTRAST TECHNIQUE: Multidetector CT imaging of the chest, abdomen and pelvis was performed following the standard protocol during bolus administration of intravenous contrast. RADIATION DOSE REDUCTION: This exam was performed according to the departmental dose-optimization program which includes automated exposure control, adjustment of the mA and/or kV according to patient size and/or use of iterative reconstruction technique. CONTRAST:  80mL OMNIPAQUE  IOHEXOL  300 MG/ML  SOLN COMPARISON:  CT abdomen and pelvis dated 06/12/2024, CTA chest dated 12/01/2021 FINDINGS: CT CHEST FINDINGS Cardiovascular: Normal heart size. No significant pericardial fluid/thickening. Great vessels are normal in course and caliber. No central pulmonary emboli. Coronary artery calcifications. Mediastinum/Nodes: Imaged thyroid  gland without nodules meeting criteria for imaging follow-up by size. Normal esophagus. No pathologically enlarged axillary, supraclavicular, mediastinal, or hilar lymph nodes. Lungs/Pleura: The central airways are patent. Patchy ground-glass opacity within the medial right lower lobe. No pneumothorax. No pleural effusion. Musculoskeletal: No acute or abnormal lytic or blastic osseous lesions. Multilevel degenerative changes of  the thoracic spine. Left mastectomy. CT ABDOMEN PELVIS FINDINGS Hepatobiliary: Unchanged subcentimeter segment 2 hypodensity (2:48), too small to characterize but likely a cyst. No intra or extrahepatic biliary ductal dilation. Cholecystectomy. Pancreas: No focal lesions or main ductal dilation. Spleen: Normal in size without focal abnormality. Adrenals/Urinary Tract: No adrenal nodules. No suspicious renal mass, calculi, or hydronephrosis. No focal bladder wall thickening. Stomach/Bowel: Normal appearance of the stomach. No evidence of bowel wall thickening, distention, or  inflammatory changes. Colonic diverticulosis without acute diverticulitis. Appendix is not discretely seen. Vascular/Lymphatic: Aortic atherosclerosis. No enlarged abdominal or pelvic lymph nodes. Reproductive: No adnexal masses. Other: No free fluid, fluid collection, or free air. Musculoskeletal: No acute or abnormal lytic or blastic osseous findings. Multilevel degenerative changes of the lumbar spine. IMPRESSION: 1. Patchy ground-glass opacity within the medial right lower lobe, likely infectious/inflammatory. 2. No acute abdominopelvic findings. 3. Colonic diverticulosis without acute diverticulitis. 4.  Aortic Atherosclerosis (ICD10-I70.0). Electronically Signed   By: Limin  Xu M.D.   On: 06/25/2024 09:26   ECHOCARDIOGRAM COMPLETE Result Date: 06/15/2024    ECHOCARDIOGRAM REPORT   Patient Name:   Cindy Huerta Date of Exam: 06/15/2024 Medical Rec #:  [id number]       Height:       66.0 in Accession #:    [id number]      Weight:       156.5 lb Date of Birth:  05-27-1946       BSA:          1.802 m Patient Age:    78 years        BP:           158/64 mmHg Patient Gender: F               HR:           82 bpm. Exam Location:  [facility] Procedure: 2D Echo, Cardiac Doppler and Color Doppler (Both Spectral and Color            Flow Doppler were utilized during procedure). Indications:     CHF  History:         Patient has prior history of Echocardiogram examinations, most                  recent 07/29/2021. TIA; Risk Factors:Hypertension and                  Dyslipidemia.  Sonographer:     Philomena Daring Referring Phys:  [id number] ANTHONY CHRISTELLA POUCH Diagnosing Phys: Redell Cave MD IMPRESSIONS  1. Left ventricular ejection fraction, by estimation, is 60 to 65%. The left ventricle has normal function. The left ventricle has no regional wall motion abnormalities. There is mild left ventricular hypertrophy. Left ventricular diastolic parameters are consistent with Grade I diastolic dysfunction (impaired relaxation).  2.  Right ventricular systolic function is normal. The right ventricular size is normal.  3. The mitral valve is normal in structure. Mild mitral valve regurgitation.  4. The aortic valve is tricuspid. Aortic valve regurgitation is not visualized.  5. The inferior vena cava is normal in size with greater than 50% respiratory variability, suggesting right atrial pressure of 3 mmHg. FINDINGS  Left Ventricle: Left ventricular ejection fraction, by estimation, is 60 to 65%. The left ventricle has normal function. The left ventricle has no regional wall motion abnormalities. The left ventricular internal cavity size was normal in size. There is  mild left ventricular hypertrophy. Left ventricular diastolic parameters are consistent  with Grade I diastolic dysfunction (impaired relaxation). Right Ventricle: The right ventricular size is normal. No increase in right ventricular wall thickness. Right ventricular systolic function is normal. Left Atrium: Left atrial size was normal in size. Right Atrium: Right atrial size was normal in size. Pericardium: There is no evidence of pericardial effusion. Mitral Valve: The mitral valve is normal in structure. Mild mitral valve regurgitation. Tricuspid Valve: The tricuspid valve is normal in structure. Tricuspid valve regurgitation is mild. Aortic Valve: The aortic valve is tricuspid. Aortic valve regurgitation is not visualized. Pulmonic Valve: The pulmonic valve was normal in structure. Pulmonic valve regurgitation is trivial. Aorta: The aortic root and ascending aorta are structurally normal, with no evidence of dilitation. Venous: The inferior vena cava is normal in size with greater than 50% respiratory variability, suggesting right atrial pressure of 3 mmHg. IAS/Shunts: No atrial level shunt detected by color flow Doppler.  LEFT VENTRICLE PLAX 2D LVIDd:         3.49 cm     Diastology LVIDs:         2.19 cm     LV e' medial:    7.45 cm/s LV PW:         1.08 cm     LV E/e' medial:   11.3 LV IVS:        1.15 cm     LV e' lateral:   10.50 cm/s LVOT diam:     2.00 cm     LV E/e' lateral: 8.0 LV SV:         62 LV SV Index:   34 LVOT Area:     3.14 cm  LV Volumes (MOD) LV vol d, MOD A2C: 61.1 ml LV vol d, MOD A4C: 65.6 ml LV vol s, MOD A2C: 11.7 ml LV vol s, MOD A4C: 20.6 ml LV SV MOD A2C:     49.4 ml LV SV MOD A4C:     65.6 ml LV SV MOD BP:      47.3 ml IVC IVC diam: 1.45 cm LEFT ATRIUM             Index        RIGHT ATRIUM           Index LA Vol (A2C):   14.3 ml 7.94 ml/m   RA Area:     11.80 cm LA Vol (A4C):   30.8 ml 17.09 ml/m  RA Volume:   26.40 ml  14.65 ml/m LA Biplane Vol: 23.0 ml 12.76 ml/m  AORTIC VALVE LVOT Vmax:   89.40 cm/s LVOT Vmean:  66.000 cm/s LVOT VTI:    0.197 m  AORTA Ao Root diam: 2.30 cm Ao Asc diam:  3.10 cm MITRAL VALVE                TRICUSPID VALVE MV Area (PHT): 4.68 cm     TR Peak grad:   25.2 mmHg MV Decel Time: 162 msec     TR Vmax:        251.00 cm/s MV E velocity: 83.90 cm/s MV A velocity: 113.00 cm/s  SHUNTS MV E/A ratio:  0.74         Systemic VTI:  0.20 m                             Systemic Diam: 2.00 cm Redell Cave MD Electronically signed by Redell Cave MD Signature Date/Time: 06/15/2024/1:22:55 PM    Final  DG Abd Portable 1V Result Date: 06/13/2024 CLINICAL DATA:  Nausea and vomiting. EXAM: PORTABLE ABDOMEN - 1 VIEW COMPARISON:  Abdominal radiograph dated 07/29/2022. FINDINGS: No bowel dilatation or evidence of obstruction. No free air. Calculi. Right upper quadrant cholecystectomy clips. Osteopenia with degenerative changes. No acute osseous pathology. IMPRESSION: Nonobstructive bowel gas pattern. Electronically Signed   By: Vanetta Chou M.D.   On: 06/13/2024 18:13   CT ABDOMEN PELVIS W CONTRAST Result Date: 06/12/2024 CLINICAL DATA:  Abdominal pain.  Sepsis. EXAM: CT ABDOMEN AND PELVIS WITH CONTRAST TECHNIQUE: Multidetector CT imaging of the abdomen and pelvis was performed using the standard protocol following bolus  administration of intravenous contrast. RADIATION DOSE REDUCTION: This exam was performed according to the departmental dose-optimization program which includes automated exposure control, adjustment of the mA and/or kV according to patient size and/or use of iterative reconstruction technique. CONTRAST:  80mL OMNIPAQUE  IOHEXOL  300 MG/ML  SOLN COMPARISON:  05/24/2024 FINDINGS: Lower chest: Prior left mastectomy. No acute findings. Coronary artery and aortic atherosclerosis. Hepatobiliary: No focal liver abnormality is seen. Status post cholecystectomy. No biliary dilatation. Pancreas: No focal abnormality or ductal dilatation. Spleen: No focal abnormality.  Normal size. Adrenals/Urinary Tract: No adrenal abnormality. No focal renal abnormality. No stones or hydronephrosis. Urinary bladder is unremarkable. Stomach/Bowel: Left colonic diverticulosis. No active diverticulitis. Stomach and small bowel decompressed. No bowel obstruction or inflammatory process. Vascular/Lymphatic: Aortic atherosclerosis. No evidence of aneurysm or adenopathy. Reproductive: Prior hysterectomy.  No adnexal masses. Other: No free fluid or free air. Musculoskeletal: No acute bony abnormality. IMPRESSION: Left colonic diverticulosis.  No active diverticulitis. Aortic atherosclerosis. No acute findings. Electronically Signed   By: Franky Crease M.D.   On: 06/12/2024 19:34   CT Head Wo Contrast Result Date: 06/12/2024 CLINICAL DATA:  Headache EXAM: CT HEAD WITHOUT CONTRAST TECHNIQUE: Contiguous axial images were obtained from the base of the skull through the vertex without intravenous contrast. RADIATION DOSE REDUCTION: This exam was performed according to the departmental dose-optimization program which includes automated exposure control, adjustment of the mA and/or kV according to patient size and/or use of iterative reconstruction technique. COMPARISON:  04/29/2024 FINDINGS: Brain: Old right caudate head lacunar infarct, stable. There is  atrophy and chronic small vessel disease changes. No acute intracranial abnormality. Specifically, no hemorrhage, hydrocephalus, mass lesion, acute infarction, or significant intracranial injury. Vascular: No hyperdense vessel or unexpected calcification. Skull: No acute calvarial abnormality. Sinuses/Orbits: No acute findings Other: None IMPRESSION: Atrophy, chronic microvascular disease. No acute intracranial abnormality. Electronically Signed   By: Franky Crease M.D.   On: 06/12/2024 19:32   DG Chest Port 1 View Result Date: 06/12/2024 CLINICAL DATA:  [id number] Sepsis [facility]) [id number] EXAM: PORTABLE CHEST - 1 VIEW COMPARISON:  May 24, 2024 FINDINGS: Lower lung volumes. No focal airspace consolidation, pleural effusion, or pneumothorax. No cardiomegaly. Tortuous aorta with aortic atherosclerosis. No acute fracture or destructive lesions. Multilevel thoracic osteophytosis. Bilateral AC joint osteoarthritis. IMPRESSION: No acute cardiopulmonary abnormality. Electronically Signed   By: Rogelia Myers M.D.   On: 06/12/2024 17:08       The results of significant diagnostics from this hospitalization (including imaging, microbiology, ancillary and laboratory) are listed below for reference.     Microbiology: Recent Results (from the past 240 hours)  Urine Culture     Status: Abnormal   Collection Time: 07/05/24 11:21 AM   Specimen: Urine, Clean Catch  Result Value Ref Range Status   Specimen Description URINE, CLEAN CATCH  Final   Special Requests  Final    NONE Performed at [facility], [address], [place], [place] [address]    Culture >=100,000 COLONIES/mL ESCHERICHIA COLI (A)  Final   Report Status 07/07/2024 FINAL  Final   Organism ID, Bacteria ESCHERICHIA COLI (A)  Final      Susceptibility   Escherichia coli - MIC*    AMPICILLIN 8 SENSITIVE Sensitive     CEFAZOLIN  <=4 SENSITIVE Sensitive     CEFEPIME  <=0.12 SENSITIVE Sensitive     CEFTRIAXONE  <=0.25 SENSITIVE Sensitive      CIPROFLOXACIN <=0.25 SENSITIVE Sensitive     GENTAMICIN <=1 SENSITIVE Sensitive     IMIPENEM <=0.25 SENSITIVE Sensitive     NITROFURANTOIN <=16 SENSITIVE Sensitive     TRIMETH/SULFA <=20 SENSITIVE Sensitive     AMPICILLIN/SULBACTAM <=2 SENSITIVE Sensitive     PIP/TAZO <=4 SENSITIVE Sensitive ug/mL    * >=100,000 COLONIES/mL ESCHERICHIA COLI  Culture, blood (Routine X 2) w Reflex to ID Panel     Status: None (Preliminary result)   Collection Time: 07/06/24  3:07 PM   Specimen: BLOOD RIGHT HAND  Result Value Ref Range Status   Specimen Description BLOOD RIGHT HAND  Final   Special Requests   Final    BOTTLES DRAWN AEROBIC AND ANAEROBIC Blood Culture results may not be optimal due to an inadequate volume of blood received in culture bottles   Culture   Final    NO GROWTH 3 DAYS Performed at [facility], [address], [place], [place] [address]    Report Status PENDING  Incomplete  Culture, blood (Routine X 2) w Reflex to ID Panel     Status: None (Preliminary result)   Collection Time: 07/06/24  3:07 PM   Specimen: BLOOD RIGHT HAND  Result Value Ref Range Status   Specimen Description BLOOD RIGHT HAND  Final   Special Requests   Final    BOTTLES DRAWN AEROBIC AND ANAEROBIC Blood Culture results may not be optimal due to an inadequate volume of blood received in culture bottles   Culture   Final    NO GROWTH 3 DAYS Performed at [facility], [address] [place], [place] [address]    Report Status PENDING  Incomplete     Labs:  CBC: Recent Labs  Lab 07/05/24 1116 07/05/24 1124 07/06/24 0343 07/07/24 0254  WBC 9.6  --  9.6 10.9*  NEUTROABS 7.0  --   --  7.0  HGB 9.9* 10.5* 9.8* 9.8*  HCT 29.6* 31.0* 28.6* 28.6*  MCV 92.2  --  90.8 91.1  PLT 254  --  243 220   BMP &GFR Recent Labs  Lab 07/05/24 1116 07/05/24 1124 07/06/24 0343 07/07/24 0254 07/09/24 0445  NA 133* 135 136 136 136  K 4.0 4.2 3.9 3.9 3.4*  CL 100 100 105 104 107  CO2 23  --  21*  22 20*  GLUCOSE 98 98 84 86 101*  BUN <5* 4* 9 11 6*  CREATININE 1.14* 1.10* 1.16* 1.12* 0.97  CALCIUM  9.4  --  9.1 8.9 9.0  MG  --   --   --  1.4* 1.6*  PHOS  --   --   --  3.5 3.9   CrCl cannot be calculated (Unknown ideal weight.). Liver & Pancreas: Recent Labs  Lab 07/05/24 1116 07/06/24 0343 07/07/24 0254 07/09/24 0445  AST 28 23  --   --   ALT 14 14  --   --  ALKPHOS 50 46  --   --   BILITOT 0.5 0.3  --   --   PROT 6.9 6.4*  --   --   ALBUMIN 3.3* 3.1* 3.0* 3.2*   No results for input(s): LIPASE, AMYLASE in the last 168 hours. No results for input(s): AMMONIA in the last 168 hours. Diabetic: No results for input(s): HGBA1C in the last 72 hours. Recent Labs  Lab 07/05/24 1022  GLUCAP 112*   Cardiac Enzymes: No results for input(s): CKTOTAL, CKMB, CKMBINDEX, TROPONINI in the last 168 hours. No results for input(s): PROBNP in the last 8760 hours. Coagulation Profile: Recent Labs  Lab 07/05/24 1116  INR 1.2   Thyroid  Function Tests: No results for input(s): TSH, T4TOTAL, FREET4, T3FREE, THYROIDAB in the last 72 hours. Lipid Profile: No results for input(s): CHOL, HDL, LDLCALC, TRIG, CHOLHDL, LDLDIRECT in the last 72 hours. Anemia Panel: No results for input(s): VITAMINB12, FOLATE, FERRITIN, TIBC, IRON , RETICCTPCT in the last 72 hours. Urine analysis:    Component Value Date/Time   COLORURINE STRAW (A) 07/05/2024 1227   APPEARANCEUR CLEAR 07/05/2024 1227   LABSPEC 1.004 (L) 07/05/2024 1227   PHURINE 8.0 07/05/2024 1227   GLUCOSEU NEGATIVE 07/05/2024 1227   HGBUR NEGATIVE 07/05/2024 1227   BILIRUBINUR NEGATIVE 07/05/2024 1227   KETONESUR NEGATIVE 07/05/2024 1227   PROTEINUR NEGATIVE 07/05/2024 1227   NITRITE NEGATIVE 07/05/2024 1227   LEUKOCYTESUR SMALL (A) 07/05/2024 1227   Sepsis Labs: Invalid input(s): PROCALCITONIN, LACTICIDVEN   SIGNED:  Rashard Ryle T Metztli Sachdev, MD  [facility] 07/09/2024,  10:16 AM

## 2024-07-09 NOTE — Care Management Important Message (Signed)
 Important Message  Patient Details  Name: [name] MRN: [id number] Date of Birth: [birth year]   Important Message Given:  Yes - Medicare IM Document given on 07/06/2024       Claretta Deed 07/09/2024, 9:53 AM

## 2024-07-09 NOTE — Progress Notes (Signed)
 Avelina LABOR Seliga to be discharged to [facility] per MD order. Report called by Omega Greulich.  Skin clean, dry, and intact without evidence of skin break down. IV catheter discontinued intact. Site without signs and symptoms of complications. Dressing and pressure applied.  An After Visit Summary was printed and given to the patient.  Patient escorted via stretcher, and discharged via [facility].  Cindy Huerta  07/09/2024

## 2024-07-09 NOTE — TOC Transition Note (Signed)
 Transition of Care [facility]) - Discharge Note   Patient Details  Name: Cindy Huerta MRN: [id number] Date of Birth: 1946-01-01  [facility] CM/SW Contact:  Almarie CHRISTELLA Goodie, LCSW Phone Number: 07/09/2024, 1:11 PM   Clinical Narrative:  Patient received insurance authorization, and [facility] has bed available. Per MD, patient stable for discharge to SNF today. CSW updated son, Marcey, he is in agreement. Transport arranged with [facility] for next available.  Nurse to call report to [phone] Room 505.    Final next level of care: Skilled Nursing Facility Barriers to Discharge: Barriers Resolved   Patient Goals and CMS Choice Patient states their goals for this hospitalization and ongoing recovery are:: to feel better CMS Medicare.gov Compare Post Acute Care list provided to:: Patient Choice offered to / list presented to : Adult Children, Patient [place] ownership interest in [facility].provided to:: Patient    Discharge Placement              Patient chooses bed at: [facility] Patient to be transferred to facility by: [facility] Name of family member notified: Marcey Patient and family notified of of transfer: 07/09/24  Discharge Plan and Services Additional resources added to the After Visit Summary for       Post Acute Care Choice: Skilled Nursing Facility                               Social Drivers of Health (SDOH) Interventions SDOH Screenings   Food Insecurity: No Food Insecurity (07/05/2024)  Housing: Unknown (07/05/2024)  Transportation Needs: No Transportation Needs (07/05/2024)  Utilities: Not At Risk (07/05/2024)  Financial Resource Strain: Patient Declined (12/29/2022)   Received from [facility]  Social Connections: Unknown (07/05/2024)  Recent Concern: Social Connections - Moderately Isolated (06/13/2024)  Tobacco Use: Low Risk  (06/27/2024)     Readmission Risk Interventions    06/15/2024   12:42 PM  05/01/2024   12:59 PM 09/13/2023   10:08 AM  Readmission Risk Prevention Plan  Transportation Screening Complete  Complete  PCP or Specialist Appt within 3-5 Days   Complete  HRI or Home Care Consult   Complete  Social Work Consult for Recovery Care Planning/Counseling   Complete  Palliative Care Screening   Not Applicable  Medication Review Oceanographer)  Complete Not Complete  Med Review Comments   Patient's son will speak with nurses upon discharge  PCP or Specialist appointment within 3-5 days of discharge Complete Complete   HRI or Home Care Consult Complete Complete   SW Recovery Care/Counseling Consult  Complete   Palliative Care Screening Not Applicable Not Applicable   Skilled Nursing Facility Complete Patient Refused

## 2024-07-11 LAB — CULTURE, BLOOD (ROUTINE X 2)
Culture: NO GROWTH
Culture: NO GROWTH

## 2024-07-21 ENCOUNTER — Other Ambulatory Visit: Payer: Self-pay

## 2024-07-21 ENCOUNTER — Encounter: Payer: Self-pay | Admitting: Intensive Care

## 2024-07-21 ENCOUNTER — Emergency Department

## 2024-07-21 ENCOUNTER — Emergency Department
Admission: EM | Admit: 2024-07-21 | Discharge: 2024-07-21 | Disposition: A | Discharge status: Home / Self Care | Attending: Emergency Medicine | Admitting: Emergency Medicine

## 2024-07-21 DIAGNOSIS — F0390 Unspecified dementia without behavioral disturbance: Secondary | ICD-10-CM | POA: Diagnosis not present

## 2024-07-21 DIAGNOSIS — W19XXXA Unspecified fall, initial encounter: Secondary | ICD-10-CM

## 2024-07-21 DIAGNOSIS — E039 Hypothyroidism, unspecified: Secondary | ICD-10-CM | POA: Diagnosis not present

## 2024-07-21 DIAGNOSIS — Z853 Personal history of malignant neoplasm of breast: Secondary | ICD-10-CM | POA: Insufficient documentation

## 2024-07-21 DIAGNOSIS — N189 Chronic kidney disease, unspecified: Secondary | ICD-10-CM | POA: Diagnosis not present

## 2024-07-21 DIAGNOSIS — S93401A Sprain of unspecified ligament of right ankle, initial encounter: Secondary | ICD-10-CM | POA: Insufficient documentation

## 2024-07-21 DIAGNOSIS — I5030 Unspecified diastolic (congestive) heart failure: Secondary | ICD-10-CM | POA: Insufficient documentation

## 2024-07-21 DIAGNOSIS — I130 Hypertensive heart and chronic kidney disease with heart failure and stage 1 through stage 4 chronic kidney disease, or unspecified chronic kidney disease: Secondary | ICD-10-CM | POA: Diagnosis not present

## 2024-07-21 DIAGNOSIS — W010XXA Fall on same level from slipping, tripping and stumbling without subsequent striking against object, initial encounter: Secondary | ICD-10-CM | POA: Diagnosis not present

## 2024-07-21 DIAGNOSIS — M25571 Pain in right ankle and joints of right foot: Secondary | ICD-10-CM | POA: Diagnosis present

## 2024-07-21 LAB — COMPREHENSIVE METABOLIC PANEL WITH GFR
ALT: 13 U/L (ref 0–44)
AST: 27 U/L (ref 15–41)
Albumin: 4 g/dL (ref 3.5–5.0)
Alkaline Phosphatase: 49 U/L (ref 38–126)
Anion gap: 12 (ref 5–15)
BUN: 21 mg/dL (ref 8–23)
CO2: 20 mmol/L — ABNORMAL LOW (ref 22–32)
Calcium: 9.6 mg/dL (ref 8.9–10.3)
Chloride: 101 mmol/L (ref 98–111)
Creatinine, Ser: 1.33 mg/dL — ABNORMAL HIGH (ref 0.44–1.00)
GFR, Estimated: 40 mL/min — ABNORMAL LOW (ref >60)
Glucose, Bld: 86 mg/dL (ref 70–99)
Potassium: 3.5 mmol/L (ref 3.5–5.1)
Sodium: 133 mmol/L — ABNORMAL LOW (ref 135–145)
Total Bilirubin: 0.8 mg/dL (ref 0.0–1.2)
Total Protein: 7.8 g/dL (ref 6.5–8.1)

## 2024-07-21 LAB — CBC WITH DIFFERENTIAL/PLATELET
Abs Immature Granulocytes: 0.04 K/uL (ref 0.00–0.07)
Basophils Absolute: 0 K/uL (ref 0.0–0.1)
Basophils Relative: 0 %
Eosinophils Absolute: 0.4 K/uL (ref 0.0–0.5)
Eosinophils Relative: 4 %
HCT: 30.9 % — ABNORMAL LOW (ref 36.0–46.0)
Hemoglobin: 10.6 g/dL — ABNORMAL LOW (ref 12.0–15.0)
Immature Granulocytes: 0 %
Lymphocytes Relative: 16 %
Lymphs Abs: 1.7 K/uL (ref 0.7–4.0)
MCH: 30.7 pg (ref 26.0–34.0)
MCHC: 34.3 g/dL (ref 30.0–36.0)
MCV: 89.6 fL (ref 80.0–100.0)
Monocytes Absolute: 0.8 K/uL (ref 0.1–1.0)
Monocytes Relative: 7 %
Neutro Abs: 8.1 K/uL — ABNORMAL HIGH (ref 1.7–7.7)
Neutrophils Relative %: 73 %
Platelets: 269 K/uL (ref 150–400)
RBC: 3.45 MIL/uL — ABNORMAL LOW (ref 3.87–5.11)
RDW: 13.6 % (ref 11.5–15.5)
WBC: 11 K/uL — ABNORMAL HIGH (ref 4.0–10.5)
nRBC: 0 % (ref 0.0–0.2)

## 2024-07-21 NOTE — Discharge Instructions (Signed)
 The x-rays of the knee and ankle showed no fractures.  You likely had a mild sprain of the right ankle.  CTs were done of the head and the cervical spine which are negative as well.  Return to the ER for new, worsening, or persistent severe ankle or leg pain, inability to walk or bear weight, recurrent falls, dizziness, weakness, severe headache, vomiting, or any other new or worsening symptoms that concern you.

## 2024-07-21 NOTE — ED Provider Notes (Signed)
 [facility] Provider Note    Event Date/Time   First MD Initiated Contact with Patient 07/21/24 1808     (approximate)   History   Fall   HPI  Cindy Huerta is a 78 y.o. female with a history of dementia, HFpEF, TIA, CKD, hypertension, hyperlipidemia, breast cancer, and hypothyroidism who presents with right ankle pain after a fall that occurred when she tripped on a step yesterday.  She has pain in the right knee as well as a headache.  She has been able to ambulate without difficulty.  She is unable to give much other history due to dementia.  I reviewed the past medical records.  The patient was admitted to the hospitalist service earlier this month and discharged on 8/4 after presenting with weakness and dizziness along with bradycardia.   Physical Exam   Triage Vital Signs: ED Triage Vitals  Encounter Vitals Group     BP 07/21/24 1647 116/67     Girls Systolic BP Percentile --      Girls Diastolic BP Percentile --      Boys Systolic BP Percentile --      Boys Diastolic BP Percentile --      Pulse Rate 07/21/24 1647 69     Resp 07/21/24 1647 16     Temp 07/21/24 1647 98.2 F (36.8 C)     Temp Source 07/21/24 1647 Oral     SpO2 07/21/24 1647 100 %     Weight 07/21/24 1648 156 lb 8.4 oz (71 kg)     Height 07/21/24 1648 5' 6 (1.676 m)     Head Circumference --      Peak Flow --      Pain Score 07/21/24 1647 5     Pain Loc --      Pain Education --      Exclude from Growth Chart --     Most recent vital signs: Vitals:   07/21/24 1647  BP: 116/67  Pulse: 69  Resp: 16  Temp: 98.2 F (36.8 C)  SpO2: 100%     General: Alert, slightly anxious appearing, no distress.  CV:  Good peripheral perfusion.  Resp:  Normal effort.  Abd:  No distention.  Other:  No midline cervical spinal tenderness.  Motor intact in all extremities.  Mild tenderness to right ankle with some swelling.  Full range of motion to the right knee and right ankle.   EOMI.  PERRLA.  Normal speech.  No facial droop.  Motor intact in all extremities.   ED Results / Procedures / Treatments   Labs (all labs ordered are listed, but only abnormal results are displayed) Labs Reviewed  CBC WITH DIFFERENTIAL/PLATELET - Abnormal; Notable for the following components:      Result Value   WBC 11.0 (*)    RBC 3.45 (*)    Hemoglobin 10.6 (*)    HCT 30.9 (*)    Neutro Abs 8.1 (*)    All other components within normal limits  COMPREHENSIVE METABOLIC PANEL WITH GFR - Abnormal; Notable for the following components:   Sodium 133 (*)    CO2 20 (*)    Creatinine, Ser 1.33 (*)    GFR, Estimated 40 (*)    All other components within normal limits     EKG     RADIOLOGY  CT head: I independently viewed and interpreted the images; there is no ICH.  Radiology report indicates no acute findings.  CT cervical  spine: No acute fracture  XR R knee: No acute fracture  XR R ankle: No acute fracture  PROCEDURES:  Critical Care performed: No  Procedures   MEDICATIONS ORDERED IN ED: Medications - No data to display   IMPRESSION / MDM / ASSESSMENT AND PLAN / ED COURSE  I reviewed the triage vital signs and the nursing notes.  78 year old female with PMH as noted above presents after mechanical fall from standing height that occurred yesterday.  She has right ankle pain and headache.  On exam there is some tenderness to the right ankle.  Neurologic exam is nonfocal.  Differential diagnosis includes, but is not limited to, minor head injury, concussion, ICH, ankle sprain, fracture.  CT head and cervical spine are negative.  X-rays of the right ankle and right knee are negative for acute fracture.  CMP and CBC were obtained from triage and show no acute findings.  At this time, the patient is stable for discharge home.  I talked to her son over the phone who confirmed the history and agreed with the discharge plan.  I gave him strict return precautions, and he  expressed understanding.  Patient's presentation is most consistent with acute complicated illness / injury requiring diagnostic workup.    FINAL CLINICAL IMPRESSION(S) / ED DIAGNOSES   Final diagnoses:  Fall, initial encounter  Sprain of right ankle, unspecified ligament, initial encounter     Rx / DC Orders   ED Discharge Orders     None        Note:  This document was prepared using Dragon voice recognition software and may include unintentional dictation errors.    Jacolyn Pae, MD 07/21/24 [phone]

## 2024-07-21 NOTE — ED Triage Notes (Signed)
 Patient reports tripping on bottom step in house yesterday. C/o right ankle pain and right knee pain. Also reports headache   Son gave patient tylenol  before coming to ER.   Son reports patient has been ambulating like normal.    Recently admitted to hospital per son due to emesis and dehydration

## 2024-07-25 NOTE — Progress Notes (Signed)
 Chief Complaint:   Chief Complaint  Patient presents with  . Hospital Follow Up    Recent hospital follow up due to fall- sprain of right ankle    Subjective:  Cindy Huerta is a 78 y.o. female in today for follow up.  She is accompanied by her son, who she lives with, and who provided the bulk of the information.  She has been hospitalized 5 times in the past 2 months and had just returned home after her last hospitalization, 7/31-07/09/24 for weakness and UTI, after a week or so at a rehab facility.  On 07/21/24, she fell and went to the ER where xrays of her left knee, ankle, head and neck showed no acute injuries.  She apparently tripped on a step, though she is not an accurate historian.  She is not having much pain and says she can get around her home stably.  She has been getting PT and OT at home but it has been disrupted repeatedly by her hospitalizations.  Appetite, BM's and urination are reportedly at baseline.  HTN.  BP has not been checked at home.  BP's at recent doctor visits are around 130/80.  No chest pain, SOB or swelling in the legs.   Hyponatremia.  She has low sodium, last check 133 on 07/21/24. Creatinine was mildly elevated on 07/21/24 (1.33).  she has low Mg and is on Mg Oxide twice daily.  Last Mg was 1.6. Arm pain.  She complains of pain and hands feeling cold, more so on the left but often on the right side as well.  Has used Aspercreme in the past but not clear she's taking anything currently.   Hypothyroidism.  Is on Synthroid  50 ug daily. Last TSH was at [facility] on 07/05/24, 15.31 though free T4 was 1.25.  No significant change in weight, appetite, energy level, BM's or temperature sensitivity.   History of anxiety.  She is on Fluoxetine  20 mg daily.  Son says he has been giving her Lorazepam  0.5 mg twice a day since coming home from rehab.  Does not sound like she's overly anxious or down; also not sedated.   Past Medical History:  Diagnosis Date  . History of cancer   .  Hypertension   . Irregular heartbeat   . Thyroid  disease    Past Surgical History:  Procedure Laterality Date  . CHOLECYSTECTOMY    . HYSTERECTOMY    . lymphnode surgery     No family history on file. Social History   Socioeconomic History  . Marital status: Widowed  Tobacco Use  . Smoking status: Never    Passive exposure: Never  . Smokeless tobacco: Never  Substance and Sexual Activity  . Alcohol  use: Never  . Drug use: Never  . Sexual activity: Defer   Social Drivers of Health   Financial Resource Strain: Patient Declined (12/29/2022)   Overall Financial Resource Strain (CARDIA)   . Difficulty of Paying Living Expenses: Patient declined  Food Insecurity: No Food Insecurity (07/05/2024)   Received from [facility]   Hunger Vital Sign   . Within the past 12 months, you worried that your food would run out before you got the money to buy more.: Never true   . Within the past 12 months, the food you bought just didn't last and you didn't have money to get more.: Never true  Transportation Needs: No Transportation Needs (07/05/2024)   Received from [facility] - Transportation   . In  the past 12 months, has lack of transportation kept you from medical appointments or from getting medications?: No   . In the past 12 months, has lack of transportation kept you from meetings, work, or from getting things needed for daily living?: No  Social Connections: Unknown (07/05/2024)   Received from [facility]   Social Connection and Isolation Panel   . In a typical week, how many times do you talk on the phone with family, friends, or neighbors?: Patient declined   . How often do you get together with friends or relatives?: Patient declined   . Do you belong to any clubs or organizations such as church groups, unions, fraternal or athletic groups, or school groups?: Patient declined   . How often do you attend meetings of the clubs or organizations you belong to?: Patient  declined   . Are you married, widowed, divorced, separated, never married, or living with a partner?: Patient declined  Recent Concern: Social Connections - Socially Isolated (05/25/2024)   Received from [facility]   Social Connection and Isolation Panel   . In a typical week, how many times do you talk on the phone with family, friends, or neighbors?: More than three times a week   . How often do you get together with friends or relatives?: More than three times a week   . How often do you attend church or religious services?: Never   . Do you belong to any clubs or organizations such as church groups, unions, fraternal or athletic groups, or school groups?: No   . How often do you attend meetings of the clubs or organizations you belong to?: Never   . Are you married, widowed, divorced, separated, never married, or living with a partner?: Widowed  Housing Stability: Unknown (02/13/2024)   Housing Stability Vital Sign   . Unable to Pay for Housing in the Last Year: Patient refused   . Homeless in the Last Year: No   Outpatient Medications Prior to Visit  Medication Sig Dispense Refill  . acetaminophen  (TYLENOL ) 500 mg capsule Take 500 mg by mouth every 6 (six) hours as needed for Pain    . aspirin  81 MG chewable tablet Take by mouth    . atorvastatin  (LIPITOR ) 80 MG tablet TAKE 1 TABLET EVERY DAY 90 tablet 3  . cloNIDine  HCL (CATAPRES ) 0.1 MG tablet TAKE 1 TABLET TWICE DAILY 180 tablet 3  . ferrous sulfate  325 (65 FE) MG EC tablet Take 325 mg by mouth daily with breakfast    . FLUoxetine  (PROZAC ) 20 MG capsule Take 1 capsule (20 mg total) by mouth once daily 90 capsule 3  . hydrALAZINE  (APRESOLINE ) 50 MG tablet TAKE 1 TABLET (50 MG TOTAL) BY MOUTH 3 (THREE) TIMES DAILY 270 tablet 3  . levothyroxine  (SYNTHROID ) 50 MCG tablet TAKE 1 TABLET ONE TIME DAILY AN EMPTY STOMACH WITH A GLASS OF WATER , AT LEAST 30-60 MINUTES BEFORE BREAKFAST 90 tablet 0  . LORazepam  (ATIVAN ) 0.5 MG tablet TAKE 1/2 TAB  TWICE DAILY AS NEEDED 15 tablet 0  . magnesium  oxide (MAG-OX) 400 mg (241.3 mg magnesium ) tablet Take 1 tablet (400 mg total) by mouth 2 (two) times daily 90 tablet 1  . meclizine  (ANTIVERT ) 12.5 mg tablet Take 1 tablet (12.5 mg total) by mouth 3 (three) times daily as needed for Dizziness 30 tablet 0  . metoprolol  succinate (TOPROL -XL) 50 MG XL tablet take 1 tablet twice daily 180 tablet 3  . multivitamin with iron -minerals (THERA-M) 9  mg iron -400 mcg tablet Take by mouth    . pantoprazole  (PROTONIX ) 40 MG DR tablet TAKE 1 TABLET EVERY DAY 90 tablet 3  . potassium chloride  (KLOR-CON  M20) 20 MEQ ER tablet TAKE 1 TABLET EVERY DAY 90 tablet 3  . telmisartan  (MICARDIS ) 80 MG tablet TAKE 1 TABLET EVERY DAY 90 tablet 3  . triamterene -hydroCHLOROthiazide  (MAXZIDE -25) 37.5-25 mg tablet TAKE 1 TABLET EVERY DAY 90 tablet 3  . trolamine salicylate (ASPERCREME TOPICAL) Apply topically    . levothyroxine  (SYNTHROID ) 75 MCG tablet TAKE 1 TABLET ONE TIME DAILY ON AN EMPTY STOMACH WITH A GLASS OF WATER  AT LEAST 30-60 MINUTES BEFORE BREAKFAST (Patient not taking: Reported on 07/25/2024) 90 tablet 3   No facility-administered medications prior to visit.   Allergies  Allergen Reactions  . Latex Rash  . Lisinopril Cough    Other reaction(s): Cough   . Oxycodone Anxiety  . Procaine Other (See Comments)    Confusion   . Diphenhydramine  Other (See Comments)    Jitteriness  Agitation  . Diphenhydramine  Hcl Other (See Comments)    Not sure of reaction   . Clarithromycin Other (See Comments)    Confusion     Objective:   Vitals:   07/25/24 1054  BP: 138/64  Pulse: 63  SpO2: 97%  Weight: 71.8 kg (158 lb 3.2 oz)  Height: 167.6 cm (5' 5.98)  PainSc: 0-No pain   Body mass index is 25.55 kg/m.   GENERAL:  Patient is alert, not fully oriented, in no acute distress.  HEENT:   Conjunctivae are noninjected.  Oropharynx:  Moist mucous membranes.  No erythema  or lesions. RESPIRATORY:  Normal  inspiratory and expiratory effort.  Lungs are clear to auscultation bilaterally. CARDIOVASCULAR:  Regular rate and rhythm, S1, S2, without murmur, rub, or gallop. ABDOMEN:  Nontender and non-distended.   EXTREMITIES:  +2 pulses bilaterally.  No edema.  Assessment/Plan:  Hospital follow up for weakness and recent ER visit for a fall.  She will work with PT and OT on regaining strength and ambulating safely.  Check U/A and other labs.  Hyponatremia.  Check Met B.    Hypothyroidism, unspecified type.  Stay on Synthroid  daily.  Check TSH, T3 and free T4 and adjust dosing if needed.  Essential hypertension, benign.  Stay on current medications. Plan: CBC w/auto Differential (5 Part), Comprehensive  Metabolic Panel (CMP)  Anxiety.  Stay on current meds.  Will try to limit Lorazepam  use due to potential cognitive and physical side effects..  Arm pain.  Can use Aspercreme but will check with son what else she's used.  Hypomagnesemia.  Check Mg and adjust supplementation if needed.  Will determine further follow up and management in the next few weeks.   Alm Na, MD, PhD

## 2024-08-01 ENCOUNTER — Emergency Department

## 2024-08-01 ENCOUNTER — Observation Stay

## 2024-08-01 ENCOUNTER — Other Ambulatory Visit: Payer: Self-pay

## 2024-08-01 ENCOUNTER — Inpatient Hospital Stay
Admission: EM | Admit: 2024-08-01 | Discharge: 2024-08-04 | DRG: 392 | Disposition: A | Discharge status: Hospice | Attending: Internal Medicine | Admitting: Internal Medicine

## 2024-08-01 DIAGNOSIS — Z7189 Other specified counseling: Secondary | ICD-10-CM

## 2024-08-01 DIAGNOSIS — F0393 Unspecified dementia, unspecified severity, with mood disturbance: Secondary | ICD-10-CM | POA: Diagnosis present

## 2024-08-01 DIAGNOSIS — R42 Dizziness and giddiness: Secondary | ICD-10-CM | POA: Diagnosis not present

## 2024-08-01 DIAGNOSIS — R251 Tremor, unspecified: Secondary | ICD-10-CM | POA: Diagnosis present

## 2024-08-01 DIAGNOSIS — Z9012 Acquired absence of left breast and nipple: Secondary | ICD-10-CM

## 2024-08-01 DIAGNOSIS — Z8249 Family history of ischemic heart disease and other diseases of the circulatory system: Secondary | ICD-10-CM

## 2024-08-01 DIAGNOSIS — Z888 Allergy status to other drugs, medicaments and biological substances status: Secondary | ICD-10-CM

## 2024-08-01 DIAGNOSIS — Z881 Allergy status to other antibiotic agents status: Secondary | ICD-10-CM

## 2024-08-01 DIAGNOSIS — I10 Essential (primary) hypertension: Secondary | ICD-10-CM | POA: Diagnosis present

## 2024-08-01 DIAGNOSIS — I5032 Chronic diastolic (congestive) heart failure: Secondary | ICD-10-CM | POA: Diagnosis present

## 2024-08-01 DIAGNOSIS — E538 Deficiency of other specified B group vitamins: Secondary | ICD-10-CM | POA: Diagnosis present

## 2024-08-01 DIAGNOSIS — Z7982 Long term (current) use of aspirin: Secondary | ICD-10-CM

## 2024-08-01 DIAGNOSIS — Z515 Encounter for palliative care: Secondary | ICD-10-CM

## 2024-08-01 DIAGNOSIS — R112 Nausea with vomiting, unspecified: Secondary | ICD-10-CM | POA: Diagnosis not present

## 2024-08-01 DIAGNOSIS — Z79890 Hormone replacement therapy (postmenopausal): Secondary | ICD-10-CM

## 2024-08-01 DIAGNOSIS — R9431 Abnormal electrocardiogram [ECG] [EKG]: Secondary | ICD-10-CM | POA: Diagnosis present

## 2024-08-01 DIAGNOSIS — R1115 Cyclical vomiting syndrome unrelated to migraine: Secondary | ICD-10-CM | POA: Diagnosis present

## 2024-08-01 DIAGNOSIS — E876 Hypokalemia: Secondary | ICD-10-CM | POA: Diagnosis present

## 2024-08-01 DIAGNOSIS — Z66 Do not resuscitate: Secondary | ICD-10-CM | POA: Diagnosis present

## 2024-08-01 DIAGNOSIS — Z8673 Personal history of transient ischemic attack (TIA), and cerebral infarction without residual deficits: Secondary | ICD-10-CM

## 2024-08-01 DIAGNOSIS — F0394 Unspecified dementia, unspecified severity, with anxiety: Secondary | ICD-10-CM | POA: Diagnosis present

## 2024-08-01 DIAGNOSIS — E039 Hypothyroidism, unspecified: Secondary | ICD-10-CM | POA: Diagnosis present

## 2024-08-01 DIAGNOSIS — Z79899 Other long term (current) drug therapy: Secondary | ICD-10-CM

## 2024-08-01 DIAGNOSIS — Z91040 Latex allergy status: Secondary | ICD-10-CM

## 2024-08-01 DIAGNOSIS — Z853 Personal history of malignant neoplasm of breast: Secondary | ICD-10-CM

## 2024-08-01 DIAGNOSIS — K219 Gastro-esophageal reflux disease without esophagitis: Secondary | ICD-10-CM | POA: Diagnosis present

## 2024-08-01 DIAGNOSIS — Z90710 Acquired absence of both cervix and uterus: Secondary | ICD-10-CM

## 2024-08-01 DIAGNOSIS — F32A Depression, unspecified: Secondary | ICD-10-CM | POA: Diagnosis present

## 2024-08-01 DIAGNOSIS — N1831 Chronic kidney disease, stage 3a: Secondary | ICD-10-CM | POA: Diagnosis present

## 2024-08-01 DIAGNOSIS — I130 Hypertensive heart and chronic kidney disease with heart failure and stage 1 through stage 4 chronic kidney disease, or unspecified chronic kidney disease: Secondary | ICD-10-CM | POA: Diagnosis present

## 2024-08-01 DIAGNOSIS — Z7901 Long term (current) use of anticoagulants: Secondary | ICD-10-CM

## 2024-08-01 DIAGNOSIS — E785 Hyperlipidemia, unspecified: Secondary | ICD-10-CM | POA: Diagnosis present

## 2024-08-01 DIAGNOSIS — F418 Other specified anxiety disorders: Secondary | ICD-10-CM | POA: Diagnosis present

## 2024-08-01 LAB — COMPREHENSIVE METABOLIC PANEL WITH GFR
ALT: 14 U/L (ref 0–44)
AST: 35 U/L (ref 15–41)
Albumin: 4 g/dL (ref 3.5–5.0)
Alkaline Phosphatase: 58 U/L (ref 38–126)
Anion gap: 15 (ref 5–15)
BUN: 16 mg/dL (ref 8–23)
CO2: 21 mmol/L — ABNORMAL LOW (ref 22–32)
Calcium: 9.7 mg/dL (ref 8.9–10.3)
Chloride: 95 mmol/L — ABNORMAL LOW (ref 98–111)
Creatinine, Ser: 1.27 mg/dL — ABNORMAL HIGH (ref 0.44–1.00)
GFR, Estimated: 43 mL/min — ABNORMAL LOW (ref >60)
Glucose, Bld: 167 mg/dL — ABNORMAL HIGH (ref 70–99)
Potassium: 3.1 mmol/L — ABNORMAL LOW (ref 3.5–5.1)
Sodium: 131 mmol/L — ABNORMAL LOW (ref 135–145)
Total Bilirubin: 0.5 mg/dL (ref 0.0–1.2)
Total Protein: 8.1 g/dL (ref 6.5–8.1)

## 2024-08-01 LAB — URINALYSIS, ROUTINE W REFLEX MICROSCOPIC
Bilirubin Urine: NEGATIVE
Glucose, UA: NEGATIVE mg/dL
Hgb urine dipstick: NEGATIVE
Ketones, ur: NEGATIVE mg/dL
Leukocytes,Ua: NEGATIVE
Nitrite: NEGATIVE
Protein, ur: NEGATIVE mg/dL
Specific Gravity, Urine: 1.014 (ref 1.005–1.030)
pH: 7 (ref 5.0–8.0)

## 2024-08-01 LAB — TROPONIN I (HIGH SENSITIVITY)
Troponin I (High Sensitivity): 8 ng/L (ref <18)
Troponin I (High Sensitivity): 9 ng/L (ref <18)

## 2024-08-01 LAB — CBC
HCT: 32.4 % — ABNORMAL LOW (ref 36.0–46.0)
Hemoglobin: 11.2 g/dL — ABNORMAL LOW (ref 12.0–15.0)
MCH: 30.6 pg (ref 26.0–34.0)
MCHC: 34.6 g/dL (ref 30.0–36.0)
MCV: 88.5 fL (ref 80.0–100.0)
Platelets: 249 K/uL (ref 150–400)
RBC: 3.66 MIL/uL — ABNORMAL LOW (ref 3.87–5.11)
RDW: 13.2 % (ref 11.5–15.5)
WBC: 16.2 K/uL — ABNORMAL HIGH (ref 4.0–10.5)
nRBC: 0 % (ref 0.0–0.2)

## 2024-08-01 LAB — MAGNESIUM: Magnesium: 1.6 mg/dL — ABNORMAL LOW (ref 1.7–2.4)

## 2024-08-01 LAB — LIPASE, BLOOD: Lipase: 24 U/L (ref 11–51)

## 2024-08-01 MED ORDER — POLYVINYL ALCOHOL 1.4 % OP SOLN: 1.0000 [drp] | Freq: Every day | OPHTHALMIC | Status: DC | PRN

## 2024-08-01 MED ORDER — ASPIRIN 81 MG PO TBEC
81.0000 mg | DELAYED_RELEASE_TABLET | Freq: Every day | ORAL | Status: DC
Start: 2024-08-01 — End: 2024-08-04
  Administered 2024-08-01 – 2024-08-04 (×4): 81 mg via ORAL
  Filled 2024-08-01 (×4): qty 1

## 2024-08-01 MED ORDER — SODIUM CHLORIDE 0.9 % IV BOLUS
1000.0000 mL | Freq: Once | INTRAVENOUS | Status: AC
  Administered 2024-08-01: 1000 mL via INTRAVENOUS

## 2024-08-01 MED ORDER — HYDRALAZINE HCL 20 MG/ML IJ SOLN: 10.0000 mg | Freq: Four times a day (QID) | INTRAMUSCULAR | Status: DC | PRN

## 2024-08-01 MED ORDER — PANTOPRAZOLE SODIUM 40 MG IV SOLR
40.0000 mg | Freq: Once | INTRAVENOUS | Status: AC
  Administered 2024-08-01: 40 mg via INTRAVENOUS
  Filled 2024-08-01: qty 10

## 2024-08-01 MED ORDER — METOCLOPRAMIDE HCL 5 MG/ML IJ SOLN
10.0000 mg | Freq: Once | INTRAMUSCULAR | Status: AC
  Administered 2024-08-01: 10 mg via INTRAVENOUS
  Filled 2024-08-01: qty 2

## 2024-08-01 MED ORDER — LORAZEPAM 0.5 MG PO TABS
0.5000 mg | ORAL_TABLET | Freq: Two times a day (BID) | ORAL | Status: DC | PRN
  Administered 2024-08-01 – 2024-08-03 (×2): 0.5 mg via ORAL
  Filled 2024-08-01 (×2): qty 1

## 2024-08-01 MED ORDER — METOCLOPRAMIDE HCL 5 MG/ML IJ SOLN
10.0000 mg | Freq: Four times a day (QID) | INTRAMUSCULAR | Status: DC | PRN
  Administered 2024-08-01 – 2024-08-03 (×3): 10 mg via INTRAVENOUS
  Filled 2024-08-01 (×3): qty 2

## 2024-08-01 MED ORDER — ALUM & MAG HYDROXIDE-SIMETH 200-200-20 MG/5ML PO SUSP
30.0000 mL | Freq: Once | ORAL | Status: AC
  Administered 2024-08-01: 30 mL via ORAL
  Filled 2024-08-01: qty 30

## 2024-08-01 MED ORDER — AMLODIPINE BESYLATE 10 MG PO TABS
10.0000 mg | ORAL_TABLET | Freq: Every day | ORAL | Status: DC
Start: 2024-08-01 — End: 2024-08-04
  Administered 2024-08-01 – 2024-08-04 (×4): 10 mg via ORAL
  Filled 2024-08-01 (×4): qty 1

## 2024-08-01 MED ORDER — FLUOXETINE HCL 20 MG PO CAPS
20.0000 mg | ORAL_CAPSULE | Freq: Every day | ORAL | Status: DC
  Administered 2024-08-01 – 2024-08-04 (×4): 20 mg via ORAL
  Filled 2024-08-01 (×4): qty 1

## 2024-08-01 MED ORDER — ACETAMINOPHEN 325 MG PO TABS
650.0000 mg | ORAL_TABLET | Freq: Four times a day (QID) | ORAL | Status: DC | PRN
  Administered 2024-08-02 – 2024-08-03 (×3): 650 mg via ORAL
  Filled 2024-08-01 (×3): qty 2

## 2024-08-01 MED ORDER — POLYETHYLENE GLYCOL 3350 17 G PO PACK: 17.0000 g | PACK | Freq: Every day | ORAL | Status: DC | PRN

## 2024-08-01 MED ORDER — LEVOTHYROXINE SODIUM 50 MCG PO TABS
50.0000 ug | ORAL_TABLET | Freq: Every day | ORAL | Status: DC
  Administered 2024-08-02 – 2024-08-04 (×3): 50 ug via ORAL
  Filled 2024-08-01 (×3): qty 1

## 2024-08-01 MED ORDER — ENOXAPARIN SODIUM 40 MG/0.4ML IJ SOSY: 40.0000 mg | PREFILLED_SYRINGE | INTRAMUSCULAR | Status: DC

## 2024-08-01 MED ORDER — ATORVASTATIN CALCIUM 20 MG PO TABS
80.0000 mg | ORAL_TABLET | Freq: Every day | ORAL | Status: DC
  Administered 2024-08-01 – 2024-08-02 (×2): 80 mg via ORAL
  Filled 2024-08-01 (×2): qty 4

## 2024-08-01 MED ORDER — ACETAMINOPHEN 650 MG RE SUPP: 650.0000 mg | Freq: Four times a day (QID) | RECTAL | Status: DC | PRN

## 2024-08-01 MED ORDER — PANTOPRAZOLE SODIUM 40 MG PO TBEC
40.0000 mg | DELAYED_RELEASE_TABLET | Freq: Every day | ORAL | Status: DC
  Administered 2024-08-01 – 2024-08-02 (×2): 40 mg via ORAL
  Filled 2024-08-01 (×2): qty 1

## 2024-08-01 MED ORDER — ENOXAPARIN SODIUM 40 MG/0.4ML IJ SOSY
40.0000 mg | PREFILLED_SYRINGE | INTRAMUSCULAR | Status: DC
  Administered 2024-08-02 – 2024-08-04 (×3): 40 mg via SUBCUTANEOUS
  Filled 2024-08-01 (×3): qty 0.4

## 2024-08-01 MED ORDER — POTASSIUM CHLORIDE 10 MEQ/100ML IV SOLN
10.0000 meq | INTRAVENOUS | Status: AC
  Administered 2024-08-01 (×3): 10 meq via INTRAVENOUS
  Filled 2024-08-01: qty 100

## 2024-08-01 MED ORDER — ONDANSETRON HCL 4 MG/2ML IJ SOLN
4.0000 mg | Freq: Once | INTRAMUSCULAR | Status: AC
  Administered 2024-08-01: 4 mg via INTRAVENOUS
  Filled 2024-08-01: qty 2

## 2024-08-01 MED ORDER — METOCLOPRAMIDE HCL 5 MG/ML IJ SOLN: 10.0000 mg | Freq: Four times a day (QID) | INTRAMUSCULAR | Status: DC

## 2024-08-01 MED ORDER — IOHEXOL 350 MG/ML SOLN
80.0000 mL | Freq: Once | INTRAVENOUS | Status: AC | PRN
  Administered 2024-08-01: 80 mL via INTRAVENOUS

## 2024-08-01 MED ORDER — FERROUS SULFATE 325 (65 FE) MG PO TABS
325.0000 mg | ORAL_TABLET | Freq: Every day | ORAL | Status: DC
  Administered 2024-08-01 – 2024-08-02 (×2): 325 mg via ORAL
  Filled 2024-08-01 (×2): qty 1

## 2024-08-01 MED ORDER — POTASSIUM CHLORIDE 10 MEQ/100ML IV SOLN
10.0000 meq | INTRAVENOUS | Status: AC
  Administered 2024-08-01 (×2): 10 meq via INTRAVENOUS
  Filled 2024-08-01 (×2): qty 100

## 2024-08-01 MED ORDER — LORAZEPAM 2 MG/ML IJ SOLN
0.5000 mg | Freq: Once | INTRAMUSCULAR | Status: AC
  Administered 2024-08-01: 0.5 mg via INTRAVENOUS
  Filled 2024-08-01: qty 1

## 2024-08-01 MED ORDER — APIXABAN 5 MG PO TABS
5.0000 mg | ORAL_TABLET | Freq: Two times a day (BID) | ORAL | Status: DC
  Administered 2024-08-01: 5 mg via ORAL
  Filled 2024-08-01: qty 1

## 2024-08-01 NOTE — ED Provider Notes (Signed)
 [facility] Provider Note    Event Date/Time   First MD Initiated Contact with Patient 08/01/24 0110     (approximate)   History   Dizziness   HPI  Cindy Huerta is a 78 y.o. female with TIA, dementia, heart failure, CKD, anxiety who comes in with concerns for vertigo.  Unclear when patient's last known normal was.  According to the triage note and been going on for several hours.  Patient had gotten IM Zofran  by EMS and has a scopolamine  patch.  I reviewed hospital admission summary from 07/05/2024 where patient was admitted for recurrent dizziness orthostatic hypotension thought to be secondary to polypharmacy.  They discontinued her meclizine , scopolamine  patches.  She has had issues with recurrent nausea and vomiting but benign abdominal exam.  I did review and patient had a CT scan of her head that was negative.  She has had MRIs that have been negative.  Patient does have a history of prolonged Qtc.  I reviewed the record and she has had multiple admissions for nausea, vomiting there was another visit where she was seen on 7/8 and had a CT head and CT abdomen pelvis that was negative.  When asked patient why she is here she remains quiet and she does not tell me any additional information.   Physical Exam   Triage Vital Signs: ED Triage Vitals [08/01/24 0108]  Encounter Vitals Group     BP (!) 192/81     Girls Systolic BP Percentile      Girls Diastolic BP Percentile      Boys Systolic BP Percentile      Boys Diastolic BP Percentile      Pulse Rate 75     Resp (!) 30     Temp (!) 97.4 F (36.3 C)     Temp src      SpO2 100 %     Weight      Height      Head Circumference      Peak Flow      Pain Score      Pain Loc      Pain Education      Exclude from Growth Chart     Most recent vital signs: Vitals:   08/01/24 0108  BP: (!) 192/81  Pulse: 75  Resp: (!) 30  Temp: (!) 97.4 F (36.3 C)  SpO2: 100%     General: Awake, no  distress.  CV:  Good peripheral perfusion.  Resp:  Normal effort.  Abd:  No distention.  Soft and nontender Other:  Patient does not want to talk.   ED Results / Procedures / Treatments   Labs (all labs ordered are listed, but only abnormal results are displayed) Labs Reviewed  COMPREHENSIVE METABOLIC PANEL WITH GFR - Abnormal; Notable for the following components:      Result Value   Sodium 131 (*)    Potassium 3.1 (*)    Chloride 95 (*)    CO2 21 (*)    Glucose, Bld 167 (*)    Creatinine, Ser 1.27 (*)    GFR, Estimated 43 (*)    All other components within normal limits  CBC - Abnormal; Notable for the following components:   WBC 16.2 (*)    RBC 3.66 (*)    Hemoglobin 11.2 (*)    HCT 32.4 (*)    All other components within normal limits  URINALYSIS, ROUTINE W REFLEX MICROSCOPIC  LIPASE, BLOOD  TROPONIN I (HIGH SENSITIVITY)     EKG  My interpretation of EKG:  Normal sinus rate of 88 without any ST elevation or T wave inversions, normal intervals    PROCEDURES:  Critical Care performed: No  .1-3 Lead EKG Interpretation  Performed by: Ernest Ronal BRAVO, MD Authorized by: Ernest Ronal BRAVO, MD     Interpretation: normal     ECG rate:  70   ECG rate assessment: normal     Rhythm: sinus rhythm     Ectopy: none     Conduction: normal      MEDICATIONS ORDERED IN ED: Medications  sodium chloride  0.9 % bolus 1,000 mL (0 mLs Intravenous Stopped 08/01/24 0408)  ondansetron  (ZOFRAN ) injection 4 mg (4 mg Intravenous Given 08/01/24 0153)  pantoprazole  (PROTONIX ) injection 40 mg (40 mg Intravenous Given 08/01/24 0153)  alum & mag hydroxide-simeth (MAALOX/MYLANTA) 200-200-20 MG/5ML suspension 30 mL (30 mLs Oral Given 08/01/24 0416)  metoCLOPramide  (REGLAN ) injection 10 mg (10 mg Intravenous Given 08/01/24 0218)  LORazepam  (ATIVAN ) injection 0.5 mg (0.5 mg Intravenous Given 08/01/24 0252)  iohexol  (OMNIPAQUE ) 350 MG/ML injection 80 mL (80 mLs Intravenous Contrast Given 08/01/24  0306)     IMPRESSION / MDM / ASSESSMENT AND PLAN / ED COURSE  I reviewed the triage vital signs and the nursing notes.   Patient's presentation is most consistent with acute presentation with potential threat to life or bodily function.   Patient comes in with nausea vomiting.  She does not want to talk I suspect related to her nausea so not able to get much information from her.  Unclear when her last known normal was but on review of records she has had multiple presentations before for uncontrolled nausea, vomiting so I suspect that this is related to her chronic issues.  Her abdomen appears soft nontender with palpation.  Patient son is now at bedside who reports that symptoms started around 8 PM.  Therefore patient out of the window for stroke code.  He reports that this is very similar to what she has had previously.  I asked if there was a headache he stated that its not really a headache and she was the spinning sensation that she feels.  He reports that this is very typical of her prior episodes.  We discussed CT imaging and have opted to decline given she has had multiple CT heads and CT abdomen that have all been negative for similar presentations.  I want to stick with symptomatic treatment to start, blood work and then can reevaluate patient.  She mostly now just reports a burning sensation in her chest from all the vomiting.  CBC shows elevated white count.  Hemoglobin is stable.  CMP shows slight AKI with creatinine of 1.2 with potassium of 3.1 and low sodium, chloride.  Patient is getting IV fluids.  2:35 AM reevaluated patient and she continues to report abdominal pain.  Her son is no longer at bedside it is hard to tell what is her typical abdominal pain if this is similar or not.  She is not the best historian and is not able to clarify to me as she just continues to moan in pain therefore we will proceed with CT imaging of her head and abdomen and order a dose of Ativan  to help  facilitate CT images.  4:24 AM CT head and CT abdomen are negative.   Attempted to do p.o. challenge and patient feeling very nauseous unable to tolerate p.o.  Will discuss with  the hospitalist for admission due to continued nausea and vomiting unable to tolerate p.o.  Repeat EKG now shows prolonged QTc of 512 Normal sinus rate 69 without ST elevation or T wave inversions with prolonged QTc  The patient is on the cardiac monitor to evaluate for evidence of arrhythmia and/or significant heart rate changes.      FINAL CLINICAL IMPRESSION(S) / ED DIAGNOSES   Final diagnoses:  Dizziness  Intractable nausea and vomiting  QT prolongation     Rx / DC Orders   ED Discharge Orders     None        Note:  This document was prepared using Dragon voice recognition software and may include unintentional dictation errors.   Ernest Ronal BRAVO, MD 08/01/24 [phone]

## 2024-08-01 NOTE — Evaluation (Signed)
 Physical Therapy Evaluation Patient Details Name: Cindy Huerta MRN: [id number] DOB: [birth year] Today's Date: 08/01/2024  History of Present Illness  Pt is an 78 y/o F presenting to ED with c/o vertigo, nausea, vomitting. Pt has had multiple recent admissions for similar concerns. PMH includes arthritis, breast cancer, depression, HLD, HTN, and Hypothyroidism; cholecystectomy; abdominal hysterectomy (1982); total mastectomy (Left, 11/20/2021), CKD stage III, history of GI bleed, TIA, and chronic HFpEF.   Clinical Impression  Pt A&Ox4 (questionable historian), agreeable to PT/OT co-evaluation. Pt cited dizziness throughout session. At baseline, pt reports being modI with mobility, citing RW use at home and rollator use for outside of home. Pt completed bed mobility with supervision, increased time and effort. 2 STS from EOB with RW and CGA. Pt became increasingly anxious during session, particularly in standing, and reported feeling shaky. Extensive reassurement and therapeutic use of self provided throughout- anxiety appears to be a main limiting factor for pt at this time. Ultimately required minA during static standing to prevent pt from sitting down while assessing BP. Orthostatic vitals assessed during session with BP 146/56 in supine, 129/72 sitting EOB, 104/91 initial standing, 116/89 after ~6mins standing. Pt able to Pt amb ~28ft in room to recliner with RW and CGA. Pt is currently displaying deficits in functional mobility and activity tolerance and would benefit from skilled PT intervention to address listed deficits and allow for safe return to PLOF.       If plan is discharge home, recommend the following: A little help with walking and/or transfers;A little help with bathing/dressing/bathroom;Assistance with cooking/housework;Assist for transportation;Help with stairs or ramp for entrance;Supervision due to cognitive status   Can travel by private vehicle        Equipment  Recommendations None recommended by PT  Recommendations for Other Services       Functional Status Assessment Patient has had a recent decline in their functional status and demonstrates the ability to make significant improvements in function in a reasonable and predictable amount of time.     Precautions / Restrictions Precautions Precautions: Fall Recall of Precautions/Restrictions: Intact Restrictions Weight Bearing Restrictions Per Provider Order: No      Mobility  Bed Mobility Overal bed mobility: Needs Assistance Bed Mobility: Supine to Sit     Supine to sit: Supervision, HOB elevated     General bed mobility comments: no physical assistance, increased time and effort    Transfers Overall transfer level: Needs assistance Equipment used: Rolling walker (2 wheels) Transfers: Sit to/from Stand Sit to Stand: Contact guard assist           General transfer comment: STS from EOB x2 with RW and CGA    Ambulation/Gait Ambulation/Gait assistance: Contact guard assist Gait Distance (Feet): 10 Feet Assistive device: Rolling walker (2 wheels) Gait Pattern/deviations: Step-through pattern, Trunk flexed, Narrow base of support       General Gait Details: no LOB, decreased cadence, pt reports feeling shaky while standing.  Stairs            Wheelchair Mobility     Tilt Bed    Modified Rankin (Stroke Patients Only)       Balance Overall balance assessment: Needs assistance Sitting-balance support: Feet supported Sitting balance-Leahy Scale: Fair     Standing balance support: Bilateral upper extremity supported, Reliant on assistive device for balance Standing balance-Leahy Scale: Fair Standing balance comment: pt reported feeling shaky in standing, required minA to remain upright  Pertinent Vitals/Pain Pain Assessment Pain Assessment: PAINAD Breathing: occasional labored breathing, short period of  hyperventilation Negative Vocalization: none Facial Expression: sad, frightened, frown Body Language: tense, distressed pacing, fidgeting Consolability: distracted or reassured by voice/touch PAINAD Score: 4 Pain Intervention(s): Monitored during session, Repositioned    Home Living Family/patient expects to be discharged to:: Private residence Living Arrangements: Children Available Help at Discharge: Family;Available 24 hours/day (son and daughter in law) Type of Home: House Home Access: Stairs to enter Entrance Stairs-Rails: None Entrance Stairs-Number of Steps: 2 Alternate Level Stairs-Number of Steps: flight Home Layout: Two level;Bed/bath upstairs Home Equipment: Shower seat - built in;BSC/3in1;Cane - quad;Cane - single point;Rolling Walker (2 wheels);Rollator (4 wheels) Additional Comments: Some home setup obtained from previous notes, pt is a questionable historian but cited majority of above info during session.    Prior Function Prior Level of Function : Independent/Modified Independent;Patient poor historian/Family not available             Mobility Comments: Pt cited using a walker for amb at home, rollator for community. Pt cites a recent fall where she tripped going up the stairs at home. ADLs Comments: pt reports being IND with most ADLs, but her son/daughter in law assist with cooking and managing her medications     Extremity/Trunk Assessment   Upper Extremity Assessment Upper Extremity Assessment: Defer to OT evaluation    Lower Extremity Assessment Lower Extremity Assessment: Generalized weakness       Communication   Communication Communication: Impaired Factors Affecting Communication: Difficulty expressing self;Reduced clarity of speech    Cognition Arousal: Alert Behavior During Therapy: Anxious   PT - Cognitive impairments: History of cognitive impairments, No family/caregiver present to determine baseline                        PT - Cognition Comments: able to answer orientation questions accurately, questionable historian Following commands: Intact       Cueing Cueing Techniques: Verbal cues, Gestural cues, Tactile cues     General Comments      Exercises Other Exercises Other Exercises: Orthostatic vitals assessed: 146/56 pulse 63 in supine, 129/72 pulse 97 sitting EOB, 104/91 pulse 168 initial standing, 116/89 pulse 91 after 3 minutes standing. Pt did report dizziness throughout session   Assessment/Plan    PT Assessment Patient needs continued PT services  PT Problem List Decreased strength;Decreased activity tolerance;Decreased balance;Decreased mobility;Decreased coordination;Decreased cognition;Decreased safety awareness       PT Treatment Interventions DME instruction;Gait training;Stair training;Functional mobility training;Therapeutic activities;Therapeutic exercise;Balance training;Neuromuscular re-education;Cognitive remediation;Patient/family education    PT Goals (Current goals can be found in the Care Plan section)  Acute Rehab PT Goals Patient Stated Goal: to get better PT Goal Formulation: With patient Time For Goal Achievement: 08/15/24 Potential to Achieve Goals: Good    Frequency Min 2X/week     Co-evaluation   Reason for Co-Treatment: For patient/therapist safety;To address functional/ADL transfers PT goals addressed during session: Mobility/safety with mobility OT goals addressed during session: ADL's and self-care       AM-PAC PT 6 Clicks Mobility  Outcome Measure Help needed turning from your back to your side while in a flat bed without using bedrails?: None Help needed moving from lying on your back to sitting on the side of a flat bed without using bedrails?: A Little Help needed moving to and from a bed to a chair (including a wheelchair)?: A Little Help needed standing up from a chair using your arms (  e.g., wheelchair or bedside chair)?: A Little Help needed  to walk in hospital room?: A Little Help needed climbing 3-5 steps with a railing? : A Little 6 Click Score: 19    End of Session   Activity Tolerance: Patient tolerated treatment well Patient left: in chair;with call bell/phone within reach;with chair alarm set Nurse Communication: Mobility status PT Visit Diagnosis: Unsteadiness on feet (R26.81);Other abnormalities of gait and mobility (R26.89);Muscle weakness (generalized) (M62.81);History of falling (Z91.81);Difficulty in walking, not elsewhere classified (R26.2);Dizziness and giddiness (R42)    Time: 8575-8551 PT Time Calculation (min) (ACUTE ONLY): 24 min   Charges:   PT Evaluation $PT Eval Low Complexity: 1 Low   PT General Charges $$ ACUTE PT VISIT: 1 Visit         Janell Axe, SPT

## 2024-08-01 NOTE — ED Triage Notes (Signed)
 Pt arrives via [facility] from home. EMS reports pt has hx of vertigo and has Vomiting when she has it. Has been ongoing for several hours. Zofran  IM 4mg  just PTA by EMS. Scopolamine  patch on. Also took vertigo PTA. En route 162/86, hr74, cbg 180, vitals x hours, hx of anxiety.c/o headache

## 2024-08-01 NOTE — Evaluation (Signed)
 Occupational Therapy Evaluation Patient Details Name: Cindy Huerta MRN: [id number] DOB: [birth year] Today's Date: 08/01/2024   History of Present Illness   Pt is an 78 y/o F presenting to ED with c/o vertigo, nausea, vomitting. Pt has had multiple recent admissions for similar concerns. PMH includes arthritis, breast cancer, depression, HLD, HTN, and Hypothyroidism; cholecystectomy; abdominal hysterectomy (1982); total mastectomy (Left, 11/20/2021), CKD stage III, history of GI bleed, TIA, and chronic HFpEF.    Clinical Impressions Ms Gombos was seen for OT evaluation this date. Prior to hospital admission, pt was MOD I for household distances. Pt lives with son and daughter in law. Pt currently requires CGA + RW for ADL t/f ~20 ft. SUPERVISION seated grooming tasks. SUP bed mobility. Pt appears anxious t/o session and endorses dizziness in static standing, no complaints with mobility. Pt would benefit from skilled OT to address noted impairments and functional limitations (see below for any additional details). Upon hospital discharge, recommend OT follow up.   Orthostatic VS for the past 24 hrs:  BP- Lying Pulse- Lying BP- Sitting Pulse- Sitting BP- Standing at 0 minutes Pulse- Standing at 0 minutes  08/01/24 1400 146/56 63 129/72 97 (!) 104/91 168      If plan is discharge home, recommend the following:   A little help with bathing/dressing/bathroom;Help with stairs or ramp for entrance;Supervision due to cognitive status     Functional Status Assessment   Patient has had a recent decline in their functional status and demonstrates the ability to make significant improvements in function in a reasonable and predictable amount of time.     Equipment Recommendations   BSC/3in1     Recommendations for Other Services         Precautions/Restrictions   Precautions Precautions: Fall Recall of Precautions/Restrictions: Impaired Restrictions Weight Bearing Restrictions Per  Provider Order: No     Mobility Bed Mobility Overal bed mobility: Needs Assistance Bed Mobility: Supine to Sit     Supine to sit: Supervision          Transfers Overall transfer level: Needs assistance Equipment used: Rolling walker (2 wheels) Transfers: Sit to/from Stand Sit to Stand: Contact guard assist                  Balance Overall balance assessment: Needs assistance Sitting-balance support: Feet supported, No upper extremity supported Sitting balance-Leahy Scale: Fair     Standing balance support: Bilateral upper extremity supported Standing balance-Leahy Scale: Fair                             ADL either performed or assessed with clinical judgement   ADL Overall ADL's : Needs assistance/impaired                                       General ADL Comments: CGA + RW for ADL t/f ~20 ft. SUPERVISION seated grooming tasks     Vision         Perception         Praxis         Pertinent Vitals/Pain Pain Assessment Pain Assessment: PAINAD Breathing: occasional labored breathing, short period of hyperventilation Negative Vocalization: none Facial Expression: smiling or inexpressive Body Language: relaxed Consolability: distracted or reassured by voice/touch PAINAD Score: 2 Pain Intervention(s): Limited activity within patient's tolerance     Extremity/Trunk Assessment Upper  Extremity Assessment Upper Extremity Assessment: Defer to OT evaluation   Lower Extremity Assessment Lower Extremity Assessment: Generalized weakness       Communication Communication Communication: Impaired Factors Affecting Communication: Difficulty expressing self;Reduced clarity of speech   Cognition Arousal: Alert Behavior During Therapy: Anxious Cognition: No family/caregiver present to determine baseline             OT - Cognition Comments: requires increased time/cues                 Following commands: Intact        Cueing  General Comments          Exercises     Shoulder Instructions      Home Living Family/patient expects to be discharged to:: Private residence Living Arrangements: Children Available Help at Discharge: Family;Available 24 hours/day (son and daughter in law) Type of Home: House Home Access: Stairs to enter [employer] of Steps: 2 Entrance Stairs-Rails: None Home Layout: Two level;Bed/bath upstairs Alternate Level Stairs-Number of Steps: flight Alternate Level Stairs-Rails: Left Bathroom Shower/Tub: Producer, television/film/video: Standard     Home Equipment: Shower seat - built in;BSC/3in1;Cane - quad;Cane - single point;Rolling Environmental consultant (2 wheels);Rollator (4 wheels)   Additional Comments: Some home setup obtained from previous notes, pt is a questionable historian but cited majority of above info during session.      Prior Functioning/Environment Prior Level of Function : Independent/Modified Independent;Patient poor historian/Family not available             Mobility Comments: Pt cited using a walker for amb at home, rollator ADLs Comments: pt reports being IND with most ADLs, but her son/daughter in law assist with cooking and managing her medications    OT Problem List: Decreased activity tolerance;Decreased safety awareness   OT Treatment/Interventions: Self-care/ADL training;Therapeutic exercise;Energy conservation;DME and/or AE instruction;Therapeutic activities      OT Goals(Current goals can be found in the care plan section)   Acute Rehab OT Goals Patient Stated Goal: to go home OT Goal Formulation: With patient Time For Goal Achievement: 08/15/24 Potential to Achieve Goals: Fair ADL Goals Pt Will Perform Grooming: with modified independence;standing Pt Will Perform Lower Body Dressing: with modified independence;sit to/from stand Pt Will Transfer to Toilet: with modified independence;ambulating;regular height toilet    OT Frequency:  Min 2X/week    Co-evaluation PT/OT/SLP Co-Evaluation/Treatment: Yes Reason for Co-Treatment: For patient/therapist safety;To address functional/ADL transfers PT goals addressed during session: Mobility/safety with mobility OT goals addressed during session: ADL's and self-care      AM-PAC OT 6 Clicks Daily Activity     Outcome Measure Help from another person eating meals?: None Help from another person taking care of personal grooming?: A Little Help from another person toileting, which includes using toliet, bedpan, or urinal?: A Little Help from another person bathing (including washing, rinsing, drying)?: A Little Help from another person to put on and taking off regular upper body clothing?: None Help from another person to put on and taking off regular lower body clothing?: A Little 6 Click Score: 20   End of Session Equipment Utilized During Treatment: Rolling walker (2 wheels)  Activity Tolerance: Patient tolerated treatment well Patient left: in chair;with call bell/phone within reach;with chair alarm set  OT Visit Diagnosis: Other abnormalities of gait and mobility (R26.89);Muscle weakness (generalized) (M62.81)                Time: 8569-8544 OT Time Calculation (min): 25 min Charges:  OT  General Charges $OT Visit: 1 Visit OT Evaluation $OT Eval Low Complexity: 1 Low OT Treatments $Self Care/Home Management : 8-22 mins  Elston Slot, M.S. OTR/L  08/01/24, 3:49 PM  ascom [phone]

## 2024-08-01 NOTE — Progress Notes (Signed)
 VAST consult. Patient up in chair at bedside. Instructed RN to place consult when patient is back in bed. Pt has seen secure chat. Powell Bowler, RN VAST

## 2024-08-01 NOTE — H&P (Addendum)
 History and Physical    Cindy Huerta FMW:[id number] DOB: 1946-01-19 DOA: 08/01/2024  DOS: the patient was seen and examined on 08/01/2024  PCP: Glover [name], MD   Patient coming from: Home  I have personally briefly reviewed patient's old medical records in [facility]  Chief Complaint: Dizziness nausea vomiting starting 6 PM last night  HPI: Cindy Huerta is a pleasant 78 y.o. female with medical history significant for recurrent vertigo with negative workup with last admission being 07/05/2024 and she attended ED on 07/21/2024 for similar complaints.  She has chronic medical problems including but not limited to HTN, HLD, hypothyroidism, history of breast cancer, GERD, dementia, history of TIA, heart failure, history of anxiety.  She was diagnosed with possible orthostatic hypotension and polypharmacy on end of July.  Patient is not able to provide meaningful history.  I have spoken to patient's son Marcey over the phone who advised me that patient was doing okay until yesterday at 6 PM when she started having dizziness and not able to walk around later she started becoming nauseated and vomited.  He denied any fever or chills chest pain, shortness of breath, palpitations or diarrhea.  ED Course: Upon arrival to the ED, patient is found to be hypertensive at 192/81, tachypneic at 30, CT scan of the brain and abdomen were negative for acute pathology.  Hospitalist service was consulted for evaluation for admission for persistent dizziness/vertigo, nausea/vomiting.  Review of Systems:  ROS  All other systems negative except as noted in the HPI.  Past Medical History:  Diagnosis Date   Acute kidney injury superimposed on chronic kidney disease (HCC) 09/13/2023   Acute metabolic encephalopathy 08/09/2023   Acute renal failure superimposed on stage 3b chronic kidney disease (HCC) 06/28/2022   AKI (acute kidney injury) (HCC) 08/09/2023   Arthritis    Breast cancer (HCC)     Depression    Elevated lactic acid level 08/17/2022   GERD (gastroesophageal reflux disease)    GIB (gastrointestinal bleeding) 10/06/2021   HLD (hyperlipidemia)    Hypertension    Hypokalemia 06/18/2022   Hyponatremia 06/18/2022   Hypothyroidism    Intractable vomiting 06/26/2024   Sepsis (HCC) 09/14/2023   UTI (urinary tract infection) 08/17/2022    Past Surgical History:  Procedure Laterality Date   ABDOMINAL HYSTERECTOMY  1982   AXILLARY SENTINEL NODE BIOPSY Left 11/20/2021   Procedure: AXILLARY SENTINEL NODE BIOPSY;  Surgeon: Tye Millet, DO;  Location: [facility] ORS;  Service: General;  Laterality: Left;   BREAST BIOPSY Left 09/23/2021   3:00 13cmfn venus marker, path pending   BREAST BIOPSY Left 09/23/2021   3:00 14 cmfn heart marker, path pending   CHOLECYSTECTOMY     ESOPHAGOGASTRODUODENOSCOPY N/A 06/27/2024   Procedure: EGD (ESOPHAGOGASTRODUODENOSCOPY);  Surgeon: Jinny Carmine, MD;  Location: [facility] ENDOSCOPY;  Service: Endoscopy;  Laterality: N/A;   ESOPHAGOGASTRODUODENOSCOPY (EGD) WITH PROPOFOL  N/A 10/08/2021   Procedure: ESOPHAGOGASTRODUODENOSCOPY (EGD) WITH PROPOFOL ;  Surgeon: Therisa Bi, MD;  Location: [facility] ENDOSCOPY;  Service: Gastroenterology;  Laterality: N/A;   HEMATOMA EVACUATION Left 11/21/2021   Procedure: EVACUATION HEMATOMA;  Surgeon: Tye Millet, DO;  Location: [facility] ORS;  Service: General;  Laterality: Left;   IR FLUORO GUIDE CV LINE LEFT  08/17/2022   IR US  GUIDE VASC ACCESS LEFT  08/17/2022   IR US  GUIDE VASC ACCESS RIGHT  08/17/2022   left lumpectomy  2019   TOTAL MASTECTOMY Left 11/20/2021   Procedure: TOTAL MASTECTOMY;  Surgeon: Tye Millet, DO;  Location: [facility]  ORS;  Service: General;  Laterality: Left;     reports that she has never smoked. She has never used smokeless tobacco. She reports that she does not drink alcohol  and does not use drugs.  Allergies  Allergen Reactions   Latex Rash   Novocain [Procaine] Other (See Comments)    Unsure - told by  DDS not to let anyone give it to her  Confusion    Zestril [Lisinopril] Cough   Benadryl  [Diphenhydramine ] Other (See Comments)    Jitteriness Agitation   Biaxin [Clarithromycin] Other (See Comments)    Confusion     Roxicodone [Oxycodone] Nausea And Vomiting and Anxiety    Family History  Problem Relation Age of Onset   Heart disease Mother    Cancer Paternal Grandmother     Prior to Admission medications   Medication Sig Start Date End Date Taking? Authorizing Provider  acetaminophen  (TYLENOL ) 500 MG tablet Take 500-1,000 mg by mouth daily as needed for moderate pain (pain score 4-6) or mild pain (pain score 1-3).   Yes [provider]  amLODipine  (NORVASC ) 10 MG tablet Take 1 tablet (10 mg total) by mouth daily. 08/25/22  Yes Amin, Ankit C, MD  apixaban  (ELIQUIS ) 5 MG TABS tablet Take 5 mg by mouth 2 (two) times daily.   Yes [provider]  ARTIFICIAL TEARS 0.1-0.3 % SOLN Place 1 drop into both eyes daily as needed for dry eyes. 07/15/22  Yes [provider]  aspirin  81 MG EC tablet Take 1 tablet (81 mg total) by mouth daily. RESTART 48HRS AFTER DISCHARGE Patient taking differently: Take 81 mg by mouth at bedtime. 11/25/21  Yes Sakai, Isami, DO  atorvastatin  (LIPITOR ) 80 MG tablet Take 80 mg by mouth daily. 09/21/21  Yes [provider]  Cholecalciferol  (VITAMIN D -3 PO) Take 1 capsule by mouth at bedtime.   Yes [provider]  ferrous sulfate  325 (65 FE) MG EC tablet Take 325 mg by mouth at bedtime.   Yes [provider]  FLUoxetine  (PROZAC ) 20 MG capsule Take 20 mg by mouth daily.   Yes [provider]  fluticasone  (FLONASE ) 50 MCG/ACT nasal spray Place 1 spray into both nostrils daily as needed for allergies or rhinitis.   Yes [provider]  levothyroxine  (SYNTHROID ) 50 MCG tablet Take 50 mcg by mouth daily before breakfast. 02/29/24 02/28/25 Yes [provider]  LORazepam  (ATIVAN ) 0.5 MG tablet  Take 1 tablet (0.5 mg total) by mouth 2 (two) times daily as needed for anxiety (shaking). 07/09/24  Yes Gonfa, Taye T, MD  ondansetron  (ZOFRAN -ODT) 4 MG disintegrating tablet Take 1 tablet (4 mg total) by mouth every 8 (eight) hours as needed for nausea or vomiting. 06/29/24  Yes Fausto Sor A, DO  pantoprazole  (PROTONIX ) 40 MG tablet Take 1 tablet (40 mg total) by mouth daily. 10/10/21 08/01/24 Yes Sreenath, Sudheer B, MD  polyethylene glycol powder (MIRALAX ) 17 GM/SCOOP powder Take 17 g by mouth 2 (two) times daily as needed for moderate constipation. 07/09/24  Yes Gonfa, Taye T, MD  senna-docusate (SENOKOT-S) 8.6-50 MG tablet Take 1 tablet by mouth 2 (two) times daily between meals as needed for mild constipation. 07/09/24  Yes Gonfa, Taye T, MD  telmisartan  (MICARDIS ) 80 MG tablet Take 80 mg by mouth daily. 05/06/24  Yes [provider]  trolamine salicylate (ASPERCREME) 10 % cream Apply 1 Application topically as needed for muscle pain.   Yes [provider]  feeding supplement (ENSURE PLUS HIGH PROTEIN) LIQD  Take 237 mLs by mouth 2 (two) times daily between meals. 06/29/24   Fausto Burnard LABOR, DO    Physical Exam: Vitals:   08/01/24 0700 08/01/24 0730 08/01/24 0800 08/01/24 0912  BP: (!) 151/74 (!) 151/74 (!) 141/77 (!) 156/69  Pulse: 63 61 62 (!) 58  Resp: 20 (!) 26 16 16   Temp:    97.8 F (36.6 C)  TempSrc:      SpO2: 100% 98% 98% 100%    Physical Exam   Constitutional: Alert, awake, calm, comfortable HEENT: Neck supple Respiratory: Clear to auscultation B/L, no wheezing, no rales.  Cardiovascular: Regular rate and rhythm, no murmurs / rubs / gallops. No extremity edema. 2+ pedal pulses. No carotid bruits.  Abdomen: Soft, no tenderness, Bowel sounds positive.  Musculoskeletal: no clubbing / cyanosis. Good ROM, no contractures. Normal muscle tone.  Skin: no rashes, lesions, ulcers. Neurologic: CN 2-12 grossly intact. Sensation intact, No focal deficit  identified Psychiatric: Alert and oriented x 3. Normal mood.    Labs on Admission: I have personally reviewed following labs and imaging studies  CBC: Recent Labs  Lab 08/01/24 0154  WBC 16.2*  HGB 11.2*  HCT 32.4*  MCV 88.5  PLT 249   Basic Metabolic Panel: Recent Labs  Lab 08/01/24 0154  NA 131*  K 3.1*  CL 95*  CO2 21*  GLUCOSE 167*  BUN 16  CREATININE 1.27*  CALCIUM  9.7   GFR: Estimated Creatinine Clearance: 33.1 mL/min (A) (by C-G formula based on SCr of 1.27 mg/dL (H)). Liver Function Tests: Recent Labs  Lab 08/01/24 0154  AST 35  ALT 14  ALKPHOS 58  BILITOT 0.5  PROT 8.1  ALBUMIN 4.0   Recent Labs  Lab 08/01/24 0154  LIPASE 24   No results for input(s): AMMONIA in the last 168 hours. Coagulation Profile: No results for input(s): INR, PROTIME in the last 168 hours. Cardiac Enzymes: Recent Labs  Lab 08/01/24 0154 08/01/24 0418  TROPONINIHS 8 9   BNP (last 3 results) Recent Labs    08/09/23 0615  BNP 76.7   HbA1C: No results for input(s): HGBA1C in the last 72 hours. CBG: No results for input(s): GLUCAP in the last 168 hours. Lipid Profile: No results for input(s): CHOL, HDL, LDLCALC, TRIG, CHOLHDL, LDLDIRECT in the last 72 hours. Thyroid  Function Tests: No results for input(s): TSH, T4TOTAL, FREET4, T3FREE, THYROIDAB in the last 72 hours. Anemia Panel: No results for input(s): VITAMINB12, FOLATE, FERRITIN, TIBC, IRON , RETICCTPCT in the last 72 hours. Urine analysis:    Component Value Date/Time   COLORURINE COLORLESS (A) 08/01/2024 0825   APPEARANCEUR CLEAR (A) 08/01/2024 0825   LABSPEC 1.014 08/01/2024 0825   PHURINE 7.0 08/01/2024 0825   GLUCOSEU NEGATIVE 08/01/2024 0825   HGBUR NEGATIVE 08/01/2024 0825   BILIRUBINUR NEGATIVE 08/01/2024 0825   KETONESUR NEGATIVE 08/01/2024 0825   PROTEINUR NEGATIVE 08/01/2024 0825   NITRITE NEGATIVE 08/01/2024 0825   LEUKOCYTESUR NEGATIVE 08/01/2024  0825    Radiological Exams on Admission: I have personally reviewed images CT ABDOMEN PELVIS W CONTRAST Result Date: 08/01/2024 CLINICAL DATA:  Acute abdominal pain EXAM: CT ABDOMEN AND PELVIS WITH CONTRAST TECHNIQUE: Multidetector CT imaging of the abdomen and pelvis was performed using the standard protocol following bolus administration of intravenous contrast. RADIATION DOSE REDUCTION: This exam was performed according to the departmental dose-optimization program which includes automated exposure control, adjustment of the mA and/or kV according to patient size and/or use of iterative reconstruction technique. CONTRAST:  80mL OMNIPAQUE   IOHEXOL  350 MG/ML SOLN COMPARISON:  06/25/2024 FINDINGS: Lower chest: No acute abnormality. Hepatobiliary: No focal liver abnormality is seen. Status post cholecystectomy. No biliary dilatation. Pancreas: Unremarkable. No pancreatic ductal dilatation or surrounding inflammatory changes. Spleen: Normal in size without focal abnormality. Adrenals/Urinary Tract: Adrenal glands are within normal limits. Kidneys demonstrate no renal calculi or obstructive changes. Bladder is within normal limits. Stomach/Bowel: Scattered diverticular change of the colon is noted without evidence of diverticulitis. Stomach and small bowel are within normal limits. The appendix is not well visualized and may have been surgically removed. No inflammatory changes to suggest appendicitis are noted. Vascular/Lymphatic: Aortic atherosclerosis. No enlarged abdominal or pelvic lymph nodes. Reproductive: Status post hysterectomy. No adnexal masses. Other: No abdominal wall hernia or abnormality. No abdominopelvic ascites. Musculoskeletal: Degenerative changes of the lumbar spine are noted. IMPRESSION: Diverticulosis without diverticulitis. Resolution of previously seen right lower lobe infiltrate. No acute abnormality noted. Electronically Signed   By: Oneil Devonshire M.D.   On: 08/01/2024 03:23   CT HEAD  WO CONTRAST ( ) Result Date: 08/01/2024 CLINICAL DATA:  Headaches and vertigo symptoms EXAM: CT HEAD WITHOUT CONTRAST TECHNIQUE: Contiguous axial images were obtained from the base of the skull through the vertex without intravenous contrast. RADIATION DOSE REDUCTION: This exam was performed according to the departmental dose-optimization program which includes automated exposure control, adjustment of the mA and/or kV according to patient size and/or use of iterative reconstruction technique. COMPARISON:  07/21/2024 FINDINGS: Brain: No evidence of acute infarction, hemorrhage, hydrocephalus, extra-axial collection or mass lesion/mass effect. Chronic white matter ischemic changes are noted. Vascular: No hyperdense vessel or unexpected calcification. Skull: Normal. Negative for fracture or focal lesion. Sinuses/Orbits: No acute finding. Other: None. IMPRESSION: Chronic white matter ischemic changes without acute abnormality. Electronically Signed   By: Oneil Devonshire M.D.   On: 08/01/2024 03:18    EKG: My personal interpretation of EKG shows: Sinus rhythm no ST elevation    Assessment/Plan Principal Problem:   Intractable nausea and vomiting Active Problems:   Cyclical vomiting with nausea   HTN (hypertension)   QT prolongation   Depression with anxiety   History of TIA (transient ischemic attack)   HLD (hyperlipidemia)   DNR (do not resuscitate)    Assessment and Plan: This is an 78 year old female with multiple medical problems including but not limited to recurrent nausea vomiting, history of TIA, HTN, anxiety/depression, HLD who came into ED for complaining of nausea vomiting and dizziness started last night.  1.  Dizziness/vertigo/nausea/vomiting - It appears that this has been ongoing for the last few months. - Workup was negative. - At this time also workup has been negative so far including CT scan of the brain, urinalysis - Patient is not able to provide meaningful history but the  son was able to tell me about her information. - She was given Zofran  when she was discharged last time but she has a prolonged QT. - She will be given Reglan  here - Due to persistent dizziness vertigo nausea vomiting, I will repeat MRI of the brain to make sure we have not missed anything.  2.  Deconditioning - PT/OT - Supportive care  3.  HTN - Will continue to monitor her blood pressure and give her hydralazine  as needed on top of her home medications  4.  Hypokalemia - Will replace potassium - Will check magnesium   5.  Leukocytosis - Patient does not have fever, UA is negative - Will get 2 sets of blood cultures - Will repeat  CBC in the morning      DVT prophylaxis: SCDs Lovenox  Code Status: DNR/DNI(Do NOT Intubate) Family Communication: Spoke with patient's son over the telephone and he advised that patient is a DO NOT RESUSCITATE and DO NOT INTUBATE Disposition Plan: Home versus rehab Consults called: None Admission status: Observation, Med-Surg   Nena Rebel, MD [facility] 08/01/2024, 11:46 AM

## 2024-08-01 NOTE — ED Notes (Signed)
 Pt assisted to room toilet by this RN. Pt very unsteady. Pt states she uses a walker at home.

## 2024-08-02 DIAGNOSIS — R42 Dizziness and giddiness: Secondary | ICD-10-CM | POA: Diagnosis not present

## 2024-08-02 DIAGNOSIS — Z515 Encounter for palliative care: Secondary | ICD-10-CM

## 2024-08-02 DIAGNOSIS — R112 Nausea with vomiting, unspecified: Secondary | ICD-10-CM | POA: Diagnosis not present

## 2024-08-02 DIAGNOSIS — Z7189 Other specified counseling: Secondary | ICD-10-CM

## 2024-08-02 DIAGNOSIS — R1115 Cyclical vomiting syndrome unrelated to migraine: Secondary | ICD-10-CM | POA: Diagnosis not present

## 2024-08-02 DIAGNOSIS — Z8673 Personal history of transient ischemic attack (TIA), and cerebral infarction without residual deficits: Secondary | ICD-10-CM

## 2024-08-02 DIAGNOSIS — I10 Essential (primary) hypertension: Secondary | ICD-10-CM | POA: Diagnosis not present

## 2024-08-02 DIAGNOSIS — Z66 Do not resuscitate: Secondary | ICD-10-CM

## 2024-08-02 LAB — COMPREHENSIVE METABOLIC PANEL WITH GFR
ALT: 12 U/L (ref 0–44)
AST: 25 U/L (ref 15–41)
Albumin: 3.4 g/dL — ABNORMAL LOW (ref 3.5–5.0)
Alkaline Phosphatase: 47 U/L (ref 38–126)
Anion gap: 7 (ref 5–15)
BUN: 17 mg/dL (ref 8–23)
CO2: 27 mmol/L (ref 22–32)
Calcium: 9.1 mg/dL (ref 8.9–10.3)
Chloride: 102 mmol/L (ref 98–111)
Creatinine, Ser: 1.21 mg/dL — ABNORMAL HIGH (ref 0.44–1.00)
GFR, Estimated: 45 mL/min — ABNORMAL LOW (ref >60)
Glucose, Bld: 100 mg/dL — ABNORMAL HIGH (ref 70–99)
Potassium: 4.2 mmol/L (ref 3.5–5.1)
Sodium: 136 mmol/L (ref 135–145)
Total Bilirubin: 0.5 mg/dL (ref 0.0–1.2)
Total Protein: 7.3 g/dL (ref 6.5–8.1)

## 2024-08-02 LAB — MAGNESIUM: Magnesium: 1.6 mg/dL — ABNORMAL LOW (ref 1.7–2.4)

## 2024-08-02 LAB — CBC
HCT: 28.8 % — ABNORMAL LOW (ref 36.0–46.0)
Hemoglobin: 9.5 g/dL — ABNORMAL LOW (ref 12.0–15.0)
MCH: 30.3 pg (ref 26.0–34.0)
MCHC: 33 g/dL (ref 30.0–36.0)
MCV: 91.7 fL (ref 80.0–100.0)
Platelets: 201 K/uL (ref 150–400)
RBC: 3.14 MIL/uL — ABNORMAL LOW (ref 3.87–5.11)
RDW: 13.9 % (ref 11.5–15.5)
WBC: 10.6 K/uL — ABNORMAL HIGH (ref 4.0–10.5)
nRBC: 0 % (ref 0.0–0.2)

## 2024-08-02 LAB — PROTIME-INR
INR: 1.6 — ABNORMAL HIGH (ref 0.8–1.2)
Prothrombin Time: 19.4 s — ABNORMAL HIGH (ref 11.4–15.2)

## 2024-08-02 NOTE — Progress Notes (Signed)
 1      PROGRESS NOTE    Cindy Huerta  FMW:[id number] DOB: Feb 22, 1946 DOA: 08/01/2024 PCP: Glover [name], MD   Brief Narrative:   78 y.o. female with medical history significant for recurrent vertigo with negative workup with last admission being 07/05/2024 and she attended ED on 07/21/2024 for similar complaints.  She has chronic medical problems including but not limited to HTN, HLD, hypothyroidism, history of breast cancer, GERD, dementia, history of TIA, heart failure, history of anxiety.  She was diagnosed with possible orthostatic hypotension and polypharmacy on end of July.  8/28: PT and OT eval.  Hospice evaluation   Assessment & Plan:   Principal Problem:   Intractable nausea and vomiting Active Problems:   Cyclical vomiting with nausea   HTN (hypertension)   QT prolongation   Depression with anxiety   History of TIA (transient ischemic attack)   HLD (hyperlipidemia)   DNR (do not resuscitate)   This is an 78 year old female with multiple medical problems including but not limited to recurrent nausea vomiting, history of TIA, HTN, anxiety/depression, HLD who came into ED for complaining of nausea vomiting and dizziness started last night.   1.  Dizziness/vertigo/nausea/vomiting - It appears that this has been ongoing for the last few months. - Workup is negative including CT scan of the brain, urinalysis - Patient is not able to provide meaningful history but the son was able to tell me about her information. - She was given Zofran  when she was discharged last time but she has a prolonged QT. - She will be given Reglan  here - Due to persistent dizziness vertigo nausea vomiting, MRI of the brain was performed which showed no acute intracranial abnormality   2.  Deconditioning - PT/OT recommends home health   3.  HTN - Continue Norvasc , hydralazine  as needed for better blood pressure control   4.  Hypokalemia - Repleted   5.  Leukocytosis - Patient does not  have fever, UA is negative - Will get 2 sets of blood cultures - Likely noninfectious etiology.  Hold off antibiotics  Dementia without behavioral disturbances Likely at baseline   DVT prophylaxis: (Lovenox  enoxaparin  (LOVENOX ) injection 40 mg Start: 08/02/24 1000 SCDs Start: 08/01/24 1120     Code Status: DNR Family Communication: Discussed with patient's son/POA over phone Disposition Plan: Likely discharge home with hospice tomorrow   Subjective:  Denies any further nausea or vomiting while in the hospital.  She wants to go home.  Asking me to talk with her son Objective: Vitals:   08/01/24 1925 08/01/24 2353 08/02/24 0341 08/02/24 0751  BP: 134/86 (!) 115/104 (!) 109/46 (!) 147/61  Pulse: 68 74 63 68  Resp: 14 15 14 16   Temp: 98.5 F (36.9 C) 98.3 F (36.8 C) 98.1 F (36.7 C) 99 F (37.2 C)  TempSrc:    Oral  SpO2: 100% 100% 99% 100%    Intake/Output Summary (Last 24 hours) at 08/02/2024 1347 Last data filed at 08/02/2024 0949 Gross per 24 hour  Intake 480 ml  Output --  Net 480 ml   There were no vitals filed for this visit.  Examination:  General exam: Appears calm and comfortable  Respiratory system: Clear to auscultation. Respiratory effort normal. Cardiovascular system: S1 & S2 heard, RRR. No JVD, murmurs, rubs, gallops or clicks. No pedal edema. Gastrointestinal system: Abdomen is soft, benign Central nervous system: Alert and oriented. No focal neurological deficits. Extremities: Symmetric 5 x 5 power. Skin: No rashes, lesions  or ulcers Psychiatry: Judgement and insight appear normal. Mood & affect appropriate.     Data Reviewed: I have personally reviewed following labs and imaging studies  CBC: Recent Labs  Lab 08/01/24 0154 08/02/24 0513  WBC 16.2* 10.6*  HGB 11.2* 9.5*  HCT 32.4* 28.8*  MCV 88.5 91.7  PLT 249 201   Basic Metabolic Panel: Recent Labs  Lab 08/01/24 0154 08/01/24 1632 08/02/24 0513  NA 131*  --  136  K 3.1*  --   4.2  CL 95*  --  102  CO2 21*  --  27  GLUCOSE 167*  --  100*  BUN 16  --  17  CREATININE 1.27*  --  1.21*  CALCIUM  9.7  --  9.1  MG  --  1.6* 1.6*   GFR: Estimated Creatinine Clearance: 34.7 mL/min (A) (by C-G formula based on SCr of 1.21 mg/dL (H)). Liver Function Tests: Recent Labs  Lab 08/01/24 0154 08/02/24 0513  AST 35 25  ALT 14 12  ALKPHOS 58 47  BILITOT 0.5 0.5  PROT 8.1 7.3  ALBUMIN 4.0 3.4*   Recent Labs  Lab 08/01/24 0154  LIPASE 24   No results for input(s): AMMONIA in the last 168 hours. Coagulation Profile: Recent Labs  Lab 08/02/24 0513  INR 1.6*     Radiology Studies: MR BRAIN WO CONTRAST Result Date: 08/01/2024 CLINICAL DATA:  Initial evaluation for acute ataxia. EXAM: MRI HEAD WITHOUT CONTRAST TECHNIQUE: Multiplanar, multiecho pulse sequences of the brain and surrounding structures were obtained without intravenous contrast. COMPARISON:  Prior CT from earlier the same day as well as previous MRI from 05/28/2024 FINDINGS: Brain: Cerebral volume within normal limits for age. Patchy and confluent T2/FLAIR hyperintensity involving the periventricular and deep white matter both cerebral hemispheres as well as the pons, consistent with chronic small vessel ischemic disease, moderate in nature. No abnormal foci of restricted diffusion to suggest acute or subacute ischemia. Apparent subtle focus of diffusion signal abnormality overlying the left frontal convexity on axial DWI image 42 noted, felt to be artifactual in nature. Gray-white matter is a shin maintained. No areas of chronic cortical infarction. No acute or chronic intracranial blood products. No mass lesion, midline shift or mass effect. No hydrocephalus or extra-axial fluid collection. Pituitary gland within normal limits. Vascular: Major intracranial vascular flow voids are maintained. Skull and upper cervical spine: Craniocervical junction within normal limits. Bone marrow signal intensity normal. No  scalp soft tissue abnormality. Sinuses/Orbits: Globes orbital soft tissues within normal limits. Paranasal sinuses are clear. No significant mastoid effusion. Other: None. IMPRESSION: 1. Stable brain MRI.  No acute intracranial abnormality. 2. Moderate chronic microvascular ischemic disease. Electronically Signed   By: Morene Hoard M.D.   On: 08/01/2024 18:34   CT ABDOMEN PELVIS W CONTRAST Result Date: 08/01/2024 CLINICAL DATA:  Acute abdominal pain EXAM: CT ABDOMEN AND PELVIS WITH CONTRAST TECHNIQUE: Multidetector CT imaging of the abdomen and pelvis was performed using the standard protocol following bolus administration of intravenous contrast. RADIATION DOSE REDUCTION: This exam was performed according to the departmental dose-optimization program which includes automated exposure control, adjustment of the mA and/or kV according to patient size and/or use of iterative reconstruction technique. CONTRAST:  80mL OMNIPAQUE  IOHEXOL  350 MG/ML SOLN COMPARISON:  06/25/2024 FINDINGS: Lower chest: No acute abnormality. Hepatobiliary: No focal liver abnormality is seen. Status post cholecystectomy. No biliary dilatation. Pancreas: Unremarkable. No pancreatic ductal dilatation or surrounding inflammatory changes. Spleen: Normal in size without focal abnormality. Adrenals/Urinary Tract: Adrenal  glands are within normal limits. Kidneys demonstrate no renal calculi or obstructive changes. Bladder is within normal limits. Stomach/Bowel: Scattered diverticular change of the colon is noted without evidence of diverticulitis. Stomach and small bowel are within normal limits. The appendix is not well visualized and may have been surgically removed. No inflammatory changes to suggest appendicitis are noted. Vascular/Lymphatic: Aortic atherosclerosis. No enlarged abdominal or pelvic lymph nodes. Reproductive: Status post hysterectomy. No adnexal masses. Other: No abdominal wall hernia or abnormality. No abdominopelvic  ascites. Musculoskeletal: Degenerative changes of the lumbar spine are noted. IMPRESSION: Diverticulosis without diverticulitis. Resolution of previously seen right lower lobe infiltrate. No acute abnormality noted. Electronically Signed   By: Oneil Devonshire M.D.   On: 08/01/2024 03:23   CT HEAD WO CONTRAST ( ) Result Date: 08/01/2024 CLINICAL DATA:  Headaches and vertigo symptoms EXAM: CT HEAD WITHOUT CONTRAST TECHNIQUE: Contiguous axial images were obtained from the base of the skull through the vertex without intravenous contrast. RADIATION DOSE REDUCTION: This exam was performed according to the departmental dose-optimization program which includes automated exposure control, adjustment of the mA and/or kV according to patient size and/or use of iterative reconstruction technique. COMPARISON:  07/21/2024 FINDINGS: Brain: No evidence of acute infarction, hemorrhage, hydrocephalus, extra-axial collection or mass lesion/mass effect. Chronic white matter ischemic changes are noted. Vascular: No hyperdense vessel or unexpected calcification. Skull: Normal. Negative for fracture or focal lesion. Sinuses/Orbits: No acute finding. Other: None. IMPRESSION: Chronic white matter ischemic changes without acute abnormality. Electronically Signed   By: Oneil Devonshire M.D.   On: 08/01/2024 03:18        Scheduled Meds:  amLODipine   10 mg Oral Daily   aspirin  EC  81 mg Oral Daily   atorvastatin   80 mg Oral Daily   enoxaparin  (LOVENOX ) injection  40 mg Subcutaneous Q24H   ferrous sulfate   325 mg Oral QHS   FLUoxetine   20 mg Oral Daily   levothyroxine   50 mcg Oral QAC breakfast   pantoprazole   40 mg Oral Daily   Continuous Infusions:   LOS: 0 days    Time spent: 35 minutes    Cresencio Fairly, MD [facility] Pager [phone]  If 7PM-7AM, please contact night-coverage [email or web address]  08/02/2024, 1:47 PM

## 2024-08-02 NOTE — Progress Notes (Signed)
 Mobility Specialist - Progress Note     08/02/24 1408  Mobility  Activity Ambulated independently;Stood at bedside  Level of Assistance Modified independent, requires aide device or extra time  Assistive Device Front wheel walker  Distance Ambulated (ft) 180 ft  Range of Motion/Exercises Active  Activity Response Tolerated well  Mobility Referral Yes  Mobility visit 1 Mobility  Mobility Specialist Start Time (ACUTE ONLY) 1357  Mobility Specialist Stop Time (ACUTE ONLY) 1410  Mobility Specialist Time Calculation (min) (ACUTE ONLY) 13 min   Pt resting in recliner upon entry on RA. Pt pleasant and agreeable to ambulation. Pt STS and ambulates to hallway around NS ModI with RW. Pt given manual directional cuing to stay on task. Pt returned to recliner and left with needs in reach. Chair alarm activated.   Guido Rumble Mobility Specialist 08/02/24, 2:15 PM

## 2024-08-02 NOTE — Progress Notes (Signed)
 [facility], Room 103  [facility] Liaison Note  Received request from Alfonso Rummer, LCSW, Transitions of Care Manager, for hospice services at home after discharge.  Spoke with son, Azariya Freeman, to initiate education related to hospice philosophy, services, and team approach to care. Son  verbalized understanding of information given.  Per discussion, the plan is for discharge home tomorrow via son's private vehicle.    DME needs discussed.  Patient has the following equipment in the home:  walker and cane.    Patient/family requests the following equipment in the home: None  Patient discharging to her son's home at [address], [place], [place] [address]  Please send signed and completed DNR home with the patient/family.  Please provide prescriptions at discharge as needed to ensure ongoing symptom management.   [facility] information and contact numbers given to son.   Above information shared withTransitions of Care Manager.     Please call with any Hospice related questions or concerns.  Thank you for the opportunity to participate in this patient's care.  Marinell Nova, [facility] Liaison [phone]

## 2024-08-02 NOTE — Progress Notes (Signed)
 Mobility Specialist - Progress Note   08/02/24 1045  Mobility  Activity Stood at bedside;Dangled on edge of bed;Ambulated with assistance  Level of Assistance Contact guard assist, steadying assist  Assistive Device Front wheel walker  Distance Ambulated (ft) 100 ft  Activity Response Tolerated well  Mobility visit 1 Mobility  Mobility Specialist Start Time (ACUTE ONLY) 1022  Mobility Specialist Stop Time (ACUTE ONLY) 1040  Mobility Specialist Time Calculation (min) (ACUTE ONLY) 18 min   Pt sitting in the recliner upon entry, utilizing RA. Pt requesting to amb in the hallway and return to bed. Pt STS to RW MinG, dons undergarments w/ SBA-- no hands on assist required during standing. Pt amb a self selected 100 ft in the hallway CGA-MinG-- Pt denied dizziness. Upon return EOB Pt became anxious, however unable to express why she feels that way. Pt left supine with alarm set and needs within reach. Pt expressed 5/10 head pain once in bed, RN notified. RN present at bedside.  America Silvan Mobility Specialist 08/02/24 10:52 AM

## 2024-08-02 NOTE — Progress Notes (Signed)
 Occupational Therapy Treatment Patient Details Name: Cindy Huerta MRN: [id number] DOB: 01-05-1946 Today's Date: 08/02/2024   History of present illness Pt is an 78 y/o F presenting to ED with c/o vertigo, nausea, vomitting. Pt has had multiple recent admissions for similar concerns. PMH includes arthritis, breast cancer, depression, HLD, HTN, and Hypothyroidism; cholecystectomy; abdominal hysterectomy (1982); total mastectomy (Left, 11/20/2021), CKD stage III, history of GI bleed, TIA, and chronic HFpEF.   OT comments  Pt is supine in bed on arrival. Anxious, but agreeable to OT session. She denies pain. Orthostatic VS for the past 24 hrs:  BP- Lying Pulse- Lying BP- Sitting Pulse- Sitting BP- Standing at 0 minutes Pulse- Standing at 0 minutes BP-standing at 3 minutes Pulse-Standing  08/02/24 1259 148/76 67 138/75 72 109/68 90 128/63 96     Pt performed bed mobility with supervision and all functional transfers, mobility and standing grooming tasks with unilateral support on sink with CGA. RW used for all mobility ~20 ft in room for ADLs and additional 85 ft in hallway prior to returning to room and sitting in recliner. Pt endorsed feeling shakey, but no LOB occurred throughout session. Pt left with all needs in place and will cont to require skilled acute OT services to maximize her safety and IND to return to PLOF.       If plan is discharge home, recommend the following:  A little help with bathing/dressing/bathroom;Help with stairs or ramp for entrance;Supervision due to cognitive status   Equipment Recommendations  BSC/3in1    Recommendations for Other Services      Precautions / Restrictions Precautions Precautions: Fall Recall of Precautions/Restrictions: Intact Restrictions Weight Bearing Restrictions Per Provider Order: No       Mobility Bed Mobility Overal bed mobility: Needs Assistance Bed Mobility: Supine to Sit     Supine to sit: Supervision, HOB elevated      General bed mobility comments: no physical assistance, increased time and effort    Transfers Overall transfer level: Needs assistance Equipment used: Rolling walker (2 wheels) Transfers: Sit to/from Stand Sit to Stand: Contact guard assist           General transfer comment: CGA from EOB to RW and ambulated additional 85 ft in hallway using RW with CGA     Balance Overall balance assessment: Needs assistance Sitting-balance support: Feet supported Sitting balance-Leahy Scale: Good     Standing balance support: Bilateral upper extremity supported, Reliant on assistive device for balance Standing balance-Leahy Scale: Fair Standing balance comment: endorses feeling shakey, but no LOB during session using RW with CGA                           ADL either performed or assessed with clinical judgement   ADL Overall ADL's : Needs assistance/impaired     Grooming: Oral care;Wash/dry face;Standing;Contact guard assist;Supervision/safety                               Functional mobility during ADLs: Contact guard assist;Rolling walker (2 wheels) General ADL Comments: standing grooming tasks at sink in bathroom with unilateral support on sink with CGA/SBA and no LOB ~20 ft of functional mobility around bed to sit in recliner    Extremity/Trunk Assessment              Vision       Perception     Praxis  Communication Communication Communication: Impaired Factors Affecting Communication: Difficulty expressing self;Reduced clarity of speech   Cognition Arousal: Alert Behavior During Therapy: Anxious                                 Following commands: Intact        Cueing   Cueing Techniques: Verbal cues, Gestural cues, Tactile cues  Exercises Other Exercises Other Exercises: orthostatic vitals taken    Shoulder Instructions       General Comments generally anxious    Pertinent Vitals/ Pain       Pain  Assessment Pain Assessment: No/denies pain Pain Intervention(s): Monitored during session  Home Living                                          Prior Functioning/Environment              Frequency  Min 2X/week        Progress Toward Goals  OT Goals(current goals can now be found in the care plan section)  Progress towards OT goals: Progressing toward goals  Acute Rehab OT Goals Patient Stated Goal: go home OT Goal Formulation: With patient Time For Goal Achievement: 08/15/24 Potential to Achieve Goals: Fair  Plan      Co-evaluation                 AM-PAC OT 6 Clicks Daily Activity     Outcome Measure   Help from another person eating meals?: None Help from another person taking care of personal grooming?: None Help from another person toileting, which includes using toliet, bedpan, or urinal?: A Little Help from another person bathing (including washing, rinsing, drying)?: A Little Help from another person to put on and taking off regular upper body clothing?: None Help from another person to put on and taking off regular lower body clothing?: A Little 6 Click Score: 21    End of Session Equipment Utilized During Treatment: Rolling walker (2 wheels)  OT Visit Diagnosis: Other abnormalities of gait and mobility (R26.89);Muscle weakness (generalized) (M62.81)   Activity Tolerance Patient tolerated treatment well   Patient Left in chair;with call bell/phone within reach;with chair alarm set   Nurse Communication          Time: [phone] OT Time Calculation (min): 28 min  Charges: OT General Charges $OT Visit: 1 Visit OT Treatments $Self Care/Home Management : 8-22 mins $Therapeutic Activity: 8-22 mins  Shaiden Aldous, OTR/L  08/02/24, 1:07 PM   Rhonin Trott E Lezli Danek 08/02/2024, 1:03 PM

## 2024-08-02 NOTE — TOC Progression Note (Signed)
 Transition of Care [facility]) - Progression Note    Patient Details  Name: CHASITTY HEHL MRN: [id number] Date of Birth: Sep 27, 1946  Transition of Care [facility] CM/SW Contact  Dalia GORMAN Fuse, RN Phone Number: 08/02/2024, 3:03 PM  Clinical Narrative:    TOC CM reached out to KEN Odor who verbalized the patient's son selected [facility]. Odor advised that Marinell with [facility] accepted the referral. TOC will continue to follow.    Expected Discharge Plan: Home w Home Health Services Barriers to Discharge: Continued Medical Work up               Expected Discharge Plan and Services     Post Acute Care Choice: Home Health Living arrangements for the past 2 months: Single Family Home                           HH Arranged: PT HH Agency: [facility] Date [facility] Agency Contacted: 08/02/24   Representative spoke with at [facility] Agency: [place]  [facility]   Social Drivers of Health (SDOH) Interventions SDOH Screenings   Food Insecurity: No Food Insecurity (08/01/2024)  Housing: Low Risk  (08/01/2024)  Transportation Needs: No Transportation Needs (08/01/2024)  Utilities: Not At Risk (08/01/2024)  Financial Resource Strain: Patient Declined (12/29/2022)   Received from [facility]  Social Connections: Patient Declined (08/01/2024)  Recent Concern: Social Connections - Moderately Isolated (06/13/2024)  Tobacco Use: Low Risk  (07/21/2024)    Readmission Risk Interventions    06/15/2024   12:42 PM 05/01/2024   12:59 PM 09/13/2023   10:08 AM  Readmission Risk Prevention Plan  Transportation Screening Complete  Complete  PCP or Specialist Appt within 3-5 Days   Complete  HRI or Home Care Consult   Complete  Social Work Consult for Recovery Care Planning/Counseling   Complete  Palliative Care Screening   Not Applicable  Medication Review Oceanographer)  Complete Not Complete  Med Review Comments   Patient's son will speak with nurses upon discharge  PCP or  Specialist appointment within 3-5 days of discharge Complete Complete   HRI or Home Care Consult Complete Complete   SW Recovery Care/Counseling Consult  Complete   Palliative Care Screening Not Applicable Not Applicable   Skilled Nursing Facility Complete Patient Refused

## 2024-08-02 NOTE — Care Management Obs Status (Signed)
 MEDICARE OBSERVATION STATUS NOTIFICATION   Patient Details  Name: Cindy Huerta MRN: [id number] Date of Birth: Sep 28, 1946   Medicare Observation Status Notification Given:  Yes    Bolden Hagerman W, CMA 08/02/2024, 11:02 AM

## 2024-08-02 NOTE — Progress Notes (Signed)
Per Dr Sherryll Burger, dc tele monitoring

## 2024-08-02 NOTE — Progress Notes (Signed)
 Mobility Specialist - Progress Note    08/02/24 1217  Mobility  Activity Ambulated independently  Level of Assistance Modified independent, requires aide device or extra time  Assistive Device Front wheel walker  Distance Ambulated (ft) 100 ft  Range of Motion/Exercises Active  Activity Response Tolerated well  Mobility Referral Yes  Mobility visit 1 Mobility  Mobility Specialist Start Time (ACUTE ONLY) 1204  Mobility Specialist Stop Time (ACUTE ONLY) 1218  Mobility Specialist Time Calculation (min) (ACUTE ONLY) 14 min   Pt resting in recliner on RA upon entry. Pt STS and ambulates to hallway around NS ModI with RW. Pt endorses no pain or dizziness during ambulation. Pt returned to recliner and left with needs in reach and chair alarm activated.   Guido Rumble Mobility Specialist 08/02/24, 12:31 PM

## 2024-08-02 NOTE — Plan of Care (Signed)

## 2024-08-02 NOTE — TOC Initial Note (Signed)
 Transition of Care [facility]) - Initial/Assessment Note    Patient Details  Name: Cindy Huerta MRN: [id number] Date of Birth: [birth year]  [facility] CM/SW Contact:    Alfonso Rummer, LCSW Phone Number: 08/02/2024, 1:23 PM  Clinical Narrative:   KEN DELENA Rummer met with pt Cindy Huerta in room 103. Pt was sitting upright in the bed, engaged and responsive. Pt reports she lives with son Kendalynn Wideman and directed LCSW A Rummer to speak with son Alyxis Grippi for medical history and TOC assessment. Pt son Yelitza Reach contacted via phone. Mr. Macpherson reports pt's pcp is Dr. Glover with [facility]. Pt is currently active with [facility]. [facility] contacted via phone to add pt to current services. Pt have a cane, walker and shower chair at home according to pt son Asha Grumbine. Pt son will provide transportation upon discharge.          Expected Discharge Plan: Home w Home Health Services Barriers to Discharge: Continued Medical Work up   Patient Goals and CMS Choice     Choice offered to / list presented to : Adult Children      Expected Discharge Plan and Services     Post Acute Care Choice: Home Health Living arrangements for the past 2 months: Single Family Home                           HH Arranged: PT HH Agency: [facility] Date [facility] Agency Contacted: 08/02/24   Representative spoke with at [facility] Agency: [place]  Pack  Prior Living Arrangements/Services Living arrangements for the past 2 months: Single Family Home Lives with:: Adult Children Patient language and need for interpreter reviewed:: No Do you feel safe going back to the place where you live?: Yes      Need for Family Participation in Patient Care: Yes (Comment) Care giver support system in place?: Yes (comment) Current home services: Home PT    Activities of Daily Living   ADL Screening (condition at time of admission) Independently performs ADLs?: No Does the patient have a  NEW difficulty with bathing/dressing/toileting/self-feeding that is expected to last >3 days?: No Does the patient have a NEW difficulty with getting in/out of bed, walking, or climbing stairs that is expected to last >3 days?: No Does the patient have a NEW difficulty with communication that is expected to last >3 days?: No Is the patient deaf or have difficulty hearing?: No Does the patient have difficulty seeing, even when wearing glasses/contacts?: No Does the patient have difficulty concentrating, remembering, or making decisions?: No  Permission Sought/Granted            Permission granted to share info w Relationship: Son Satia Winger     Emotional Assessment Appearance:: Appears stated age Attitude/Demeanor/Rapport: Engaged Affect (typically observed): Appropriate Orientation: : Oriented to Self, Oriented to Place, Oriented to  Time      Admission diagnosis:  Dizziness [R42] QT prolongation [R94.31] Intractable nausea and vomiting [R11.2] Patient Active Problem List   Diagnosis Date Noted   Acute metabolic encephalopathy 07/06/2024   Cyclical vomiting with nausea 06/12/2024   Headache 06/12/2024   Cyclical vomiting 06/12/2024   Lactic acidosis 05/25/2024   Hypochloremia 05/25/2024   Dementia (HCC) 05/25/2024   Nausea and vomiting 04/29/2024   Leukocytosis 09/13/2023   Abnormal TSH 09/13/2023   Syncope 08/09/2023   Hypomagnesemia 08/09/2023   QT prolongation 08/09/2023   Elevated troponin 08/09/2023  Iron  deficiency anemia 08/17/2022   Fall at home, initial encounter 08/17/2022   Abnormal LFTs 08/17/2022   Dehydration 07/27/2022   Malnutrition of moderate degree 06/30/2022   Acute urinary retention 06/29/2022   DNR (do not resuscitate) 06/28/2022   Chronic diastolic CHF (congestive heart failure) (HCC) 06/18/2022   Dysuria 06/18/2022   HTN (hypertension) 11/24/2021   Normocytic anemia 11/24/2021   Vertigo 11/24/2021   Breast cancer (HCC) 11/20/2021    Intractable nausea and vomiting 10/09/2021   Weakness 10/07/2021   Carcinoma of overlapping sites of left breast in female, estrogen receptor positive (HCC) 10/06/2021   Dizziness 10/06/2021   History of GI bleed 10/06/2021   Left-sided weakness 07/29/2021   HLD (hyperlipidemia) 07/29/2021   Hypothyroidism 07/29/2021   Left sided numbness 07/29/2021   Depression with anxiety    CKD (chronic kidney disease), stage IIIa    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   Epigastric pain 04/23/2021   GERD (gastroesophageal reflux disease) 04/23/2021   History of breast cancer 01/20/2021   History of TIA (transient ischemic attack) 01/20/2021   Palpitations 01/20/2021   Malignant neoplasm of 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) 12/06/2018   PCP:  Glover [name], MD Pharmacy:   [phone] - [address], [place] - [address] [place] [address] Phone: [phone] Fax: [phone]  [facility] [address] [place] [address] Phone: [phone] Fax: [phone]     Social Drivers of Health (SDOH) Social History: SDOH Screenings   Food Insecurity: No Food Insecurity (08/01/2024)  Housing: Low Risk  (08/01/2024)  Transportation Needs: No Transportation Needs (08/01/2024)  Utilities: Not At Risk (08/01/2024)  Financial Resource Strain: Patient Declined (12/29/2022)   Received from [facility]  Social Connections: Patient Declined (08/01/2024)  Recent Concern: Social Connections - Moderately Isolated (06/13/2024)  Tobacco Use: Low Risk  (07/21/2024)   SDOH Interventions:     Readmission Risk Interventions    06/15/2024   12:42 PM 05/01/2024   12:59 PM 09/13/2023   10:08 AM  Readmission Risk Prevention Plan  Transportation Screening Complete  Complete  PCP or Specialist Appt within 3-5 Days   Complete  HRI or Home Care Consult   Complete  Social Work Consult for Recovery Care Planning/Counseling    Complete  Palliative Care Screening   Not Applicable  Medication Review Oceanographer)  Complete Not Complete  Med Review Comments   Patient's son will speak with nurses upon discharge  PCP or Specialist appointment within 3-5 days of discharge Complete Complete   HRI or Home Care Consult Complete Complete   SW Recovery Care/Counseling Consult  Complete   Palliative Care Screening Not Applicable Not Applicable   Skilled Nursing Facility Complete Patient Refused

## 2024-08-02 NOTE — IPAL (Signed)
  Interdisciplinary Goals of Care Family Meeting   Date carried out: 08/02/2024  Location of the meeting: Phone conference  Member's involved: Physician and Family Member or next of kin  Durable Power of Attorney or acting medical decision maker: [name]/POA/son  Discussion: We discussed goals of care for Cindy Huerta.  Patient has had significant clinical and functional decline over the last six 1 to 1-year.  She has 6 readmissions in the last 6 months showing in CHL.  Code status:   Code Status: Limited: Do not attempt resuscitation (DNR) -DNR-LIMITED -Do Not Intubate/DNI    Disposition: Home with Hospice  Time spent for the meeting: 35 minutes    Cresencio Fairly, MD  08/02/2024, 1:46 PM

## 2024-08-03 DIAGNOSIS — N1831 Chronic kidney disease, stage 3a: Secondary | ICD-10-CM | POA: Diagnosis present

## 2024-08-03 DIAGNOSIS — Z515 Encounter for palliative care: Secondary | ICD-10-CM | POA: Diagnosis not present

## 2024-08-03 DIAGNOSIS — E876 Hypokalemia: Secondary | ICD-10-CM | POA: Diagnosis present

## 2024-08-03 DIAGNOSIS — Z66 Do not resuscitate: Secondary | ICD-10-CM | POA: Diagnosis present

## 2024-08-03 DIAGNOSIS — F0394 Unspecified dementia, unspecified severity, with anxiety: Secondary | ICD-10-CM | POA: Diagnosis present

## 2024-08-03 DIAGNOSIS — I130 Hypertensive heart and chronic kidney disease with heart failure and stage 1 through stage 4 chronic kidney disease, or unspecified chronic kidney disease: Secondary | ICD-10-CM | POA: Diagnosis present

## 2024-08-03 DIAGNOSIS — F418 Other specified anxiety disorders: Secondary | ICD-10-CM

## 2024-08-03 DIAGNOSIS — I5032 Chronic diastolic (congestive) heart failure: Secondary | ICD-10-CM | POA: Diagnosis present

## 2024-08-03 DIAGNOSIS — I10 Essential (primary) hypertension: Secondary | ICD-10-CM

## 2024-08-03 DIAGNOSIS — F32A Depression, unspecified: Secondary | ICD-10-CM | POA: Diagnosis present

## 2024-08-03 DIAGNOSIS — Z7901 Long term (current) use of anticoagulants: Secondary | ICD-10-CM | POA: Diagnosis not present

## 2024-08-03 DIAGNOSIS — Z9012 Acquired absence of left breast and nipple: Secondary | ICD-10-CM | POA: Diagnosis not present

## 2024-08-03 DIAGNOSIS — R251 Tremor, unspecified: Secondary | ICD-10-CM | POA: Diagnosis present

## 2024-08-03 DIAGNOSIS — E785 Hyperlipidemia, unspecified: Secondary | ICD-10-CM | POA: Diagnosis present

## 2024-08-03 DIAGNOSIS — K219 Gastro-esophageal reflux disease without esophagitis: Secondary | ICD-10-CM | POA: Diagnosis present

## 2024-08-03 DIAGNOSIS — Z91040 Latex allergy status: Secondary | ICD-10-CM | POA: Diagnosis not present

## 2024-08-03 DIAGNOSIS — Z7982 Long term (current) use of aspirin: Secondary | ICD-10-CM | POA: Diagnosis not present

## 2024-08-03 DIAGNOSIS — Z79890 Hormone replacement therapy (postmenopausal): Secondary | ICD-10-CM | POA: Diagnosis not present

## 2024-08-03 DIAGNOSIS — E538 Deficiency of other specified B group vitamins: Secondary | ICD-10-CM | POA: Diagnosis present

## 2024-08-03 DIAGNOSIS — F0393 Unspecified dementia, unspecified severity, with mood disturbance: Secondary | ICD-10-CM | POA: Diagnosis present

## 2024-08-03 DIAGNOSIS — R1115 Cyclical vomiting syndrome unrelated to migraine: Secondary | ICD-10-CM | POA: Diagnosis not present

## 2024-08-03 DIAGNOSIS — R9431 Abnormal electrocardiogram [ECG] [EKG]: Secondary | ICD-10-CM | POA: Diagnosis present

## 2024-08-03 DIAGNOSIS — Z8249 Family history of ischemic heart disease and other diseases of the circulatory system: Secondary | ICD-10-CM | POA: Diagnosis not present

## 2024-08-03 DIAGNOSIS — R112 Nausea with vomiting, unspecified: Secondary | ICD-10-CM | POA: Diagnosis present

## 2024-08-03 DIAGNOSIS — R42 Dizziness and giddiness: Secondary | ICD-10-CM | POA: Diagnosis present

## 2024-08-03 DIAGNOSIS — E039 Hypothyroidism, unspecified: Secondary | ICD-10-CM | POA: Diagnosis present

## 2024-08-03 DIAGNOSIS — Z8673 Personal history of transient ischemic attack (TIA), and cerebral infarction without residual deficits: Secondary | ICD-10-CM | POA: Diagnosis not present

## 2024-08-03 LAB — IRON AND TIBC
Iron: 71 ug/dL (ref 28–170)
Saturation Ratios: 29 % (ref 10.4–31.8)
TIBC: 245 ug/dL — ABNORMAL LOW (ref 250–450)
UIBC: 174 ug/dL

## 2024-08-03 LAB — BASIC METABOLIC PANEL WITH GFR
Anion gap: 13 (ref 5–15)
BUN: 19 mg/dL (ref 8–23)
CO2: 24 mmol/L (ref 22–32)
Calcium: 9.3 mg/dL (ref 8.9–10.3)
Chloride: 99 mmol/L (ref 98–111)
Creatinine, Ser: 1.03 mg/dL — ABNORMAL HIGH (ref 0.44–1.00)
GFR, Estimated: 55 mL/min — ABNORMAL LOW (ref >60)
Glucose, Bld: 101 mg/dL — ABNORMAL HIGH (ref 70–99)
Potassium: 3.5 mmol/L (ref 3.5–5.1)
Sodium: 136 mmol/L (ref 135–145)

## 2024-08-03 LAB — CBC
HCT: 28.5 % — ABNORMAL LOW (ref 36.0–46.0)
Hemoglobin: 9.6 g/dL — ABNORMAL LOW (ref 12.0–15.0)
MCH: 30.4 pg (ref 26.0–34.0)
MCHC: 33.7 g/dL (ref 30.0–36.0)
MCV: 90.2 fL (ref 80.0–100.0)
Platelets: 217 K/uL (ref 150–400)
RBC: 3.16 MIL/uL — ABNORMAL LOW (ref 3.87–5.11)
RDW: 13.7 % (ref 11.5–15.5)
WBC: 11.2 K/uL — ABNORMAL HIGH (ref 4.0–10.5)
nRBC: 0 % (ref 0.0–0.2)

## 2024-08-03 LAB — FERRITIN: Ferritin: 174 ng/mL (ref 11–307)

## 2024-08-03 LAB — FOLATE: Folate: 4.4 ng/mL — ABNORMAL LOW (ref >5.9)

## 2024-08-03 LAB — VITAMIN B12: Vitamin B-12: 211 pg/mL (ref 180–914)

## 2024-08-03 MED ORDER — PANTOPRAZOLE SODIUM 40 MG PO TBEC
40.0000 mg | DELAYED_RELEASE_TABLET | Freq: Two times a day (BID) | ORAL | Status: DC
  Administered 2024-08-03 – 2024-08-04 (×2): 40 mg via ORAL
  Filled 2024-08-03 (×2): qty 1

## 2024-08-03 MED ORDER — FOLIC ACID 1 MG PO TABS
1.0000 mg | ORAL_TABLET | Freq: Every day | ORAL | Status: DC
  Administered 2024-08-03 – 2024-08-04 (×2): 1 mg via ORAL
  Filled 2024-08-03 (×2): qty 1

## 2024-08-03 MED ORDER — SODIUM CHLORIDE 0.9 % IV SOLN: INTRAVENOUS | Status: AC

## 2024-08-03 MED ORDER — OLANZAPINE 2.5 MG PO TABS
2.5000 mg | ORAL_TABLET | Freq: Every day | ORAL | Status: DC
  Administered 2024-08-03: 2.5 mg via ORAL
  Filled 2024-08-03: qty 1

## 2024-08-03 NOTE — Plan of Care (Signed)

## 2024-08-03 NOTE — Progress Notes (Signed)
 PT Cancellation Note  Patient Details Name: Cindy Huerta MRN: [id number] DOB: 07-20-1946   Cancelled Treatment:     PT attempt. Pt was impulsively/urgently ambulating to BR with RN tech present upon arrival. Pt was wrapped up in IV line. Vcs to slow down. Pt was seated on toilet when author left room. Author will return at a more appropriate time when pt is more available to participate. Pt has been working with mobility techs routinely. DC recs remain appropriate.    Rankin KATHEE Essex 08/03/2024, 2:02 PM

## 2024-08-03 NOTE — Progress Notes (Signed)
 1      PROGRESS NOTE    Cindy Huerta  FMW:[id number] DOB: May 12, 1946 DOA: 08/01/2024 PCP: Glover [name], MD   Brief Narrative:   78 y.o. female with medical history significant for recurrent vertigo with negative workup with last admission being 07/05/2024 and she attended ED on 07/21/2024 for similar complaints.  She has chronic medical problems including but not limited to HTN, HLD, hypothyroidism, history of breast cancer, GERD, dementia, history of TIA, heart failure, history of anxiety.  She was diagnosed with possible orthostatic hypotension and polypharmacy on end of July.  8/28: PT and OT eval.  Hospice evaluation 8/29: reports 3 vomits overnight   Assessment & Plan:   Principal Problem:   Intractable nausea and vomiting Active Problems:   Cyclical vomiting with nausea   HTN (hypertension)   QT prolongation   Depression with anxiety   History of TIA (transient ischemic attack)   HLD (hyperlipidemia)   DNR (do not resuscitate)   Goals of care, counseling/discussion   Hospice care patient   This is an 78 year old female with multiple medical problems including but not limited to recurrent nausea vomiting, history of TIA, HTN, anxiety/depression, HLD who came into ED for complaining of nausea vomiting and dizziness started last night.   1.  Dizziness/vertigo/nausea/vomiting - It appears that this has been ongoing for the last few months. She reports 3 vomits overnight and had 1 this am. Will c/s GI - Dr Unk aware, keep NPO except meds-sips for now - Workup is negative including CT scan of the brain, urinalysis - Patient is not able to provide meaningful history but the son was able to tell me about her information. - Reglan  prn - Due to persistent dizziness vertigo nausea vomiting, MRI of the brain was performed which showed no acute intracranial abnormality   2.  Deconditioning - PT/OT recommends home health   3.  HTN - Continue Norvasc , hydralazine  as needed for  better blood pressure control   4.  Hypokalemia - Repleted   5.  Leukocytosis - Patient does not have fever, UA is negative - Neg 2 sets of blood cultures - Likely noninfectious etiology.  Hold off antibiotics  6. Dementia without behavioral disturbances Likely at baseline, plan for Hospice at DC   DVT prophylaxis: (Lovenox  enoxaparin  (LOVENOX ) injection 40 mg Start: 08/02/24 1000 SCDs Start: 08/01/24 1120     Code Status: DNR Family Communication: Discussed with patient's son/POA over phone on 8/28 Disposition Plan: Likely discharge home with hospice tomorrow if N/V improves   Subjective:  Had 3 vomits overnight and 1 this am, not feeling well.  Objective: Vitals:   08/02/24 1628 08/02/24 2040 08/03/24 0521 08/03/24 0856  BP: 139/75 138/61 (!) 159/83 (!) 163/77  Pulse: 89 87 84 78  Resp: 19 18 16 16   Temp: 98.2 F (36.8 C) 98.6 F (37 C) 98.9 F (37.2 C) 99.2 F (37.3 C)  TempSrc:  Oral    SpO2: 100% 100% 100% 100%    Intake/Output Summary (Last 24 hours) at 08/03/2024 1312 Last data filed at 08/03/2024 0900 Gross per 24 hour  Intake 240 ml  Output --  Net 240 ml   There were no vitals filed for this visit.  Examination:  General exam: Appears calm and comfortable  Respiratory system: Clear to auscultation. Respiratory effort normal. Cardiovascular system: S1 & S2 heard, RRR. No JVD, murmurs, rubs, gallops or clicks. No pedal edema. Gastrointestinal system: Abdomen is soft, benign Central nervous system: Alert and  oriented. No focal neurological deficits. Extremities: Symmetric 5 x 5 power. Skin: No rashes, lesions or ulcers Psychiatry: Judgement and insight appear normal. Mood & affect appropriate.     Data Reviewed: I have personally reviewed following labs and imaging studies  CBC: Recent Labs  Lab 08/01/24 0154 08/02/24 0513 08/03/24 0548  WBC 16.2* 10.6* 11.2*  HGB 11.2* 9.5* 9.6*  HCT 32.4* 28.8* 28.5*  MCV 88.5 91.7 90.2  PLT 249 201  217   Basic Metabolic Panel: Recent Labs  Lab 08/01/24 0154 08/01/24 1632 08/02/24 0513 08/03/24 0548  NA 131*  --  136 136  K 3.1*  --  4.2 3.5  CL 95*  --  102 99  CO2 21*  --  27 24  GLUCOSE 167*  --  100* 101*  BUN 16  --  17 19  CREATININE 1.27*  --  1.21* 1.03*  CALCIUM  9.7  --  9.1 9.3  MG  --  1.6* 1.6*  --    GFR: Estimated Creatinine Clearance: 40.8 mL/min (A) (by C-G formula based on SCr of 1.03 mg/dL (H)). Liver Function Tests: Recent Labs  Lab 08/01/24 0154 08/02/24 0513  AST 35 25  ALT 14 12  ALKPHOS 58 47  BILITOT 0.5 0.5  PROT 8.1 7.3  ALBUMIN 4.0 3.4*   Recent Labs  Lab 08/01/24 0154  LIPASE 24   No results for input(s): AMMONIA in the last 168 hours. Coagulation Profile: Recent Labs  Lab 08/02/24 0513  INR 1.6*     Radiology Studies: No results found.       Scheduled Meds:  amLODipine   10 mg Oral Daily   aspirin  EC  81 mg Oral Daily   enoxaparin  (LOVENOX ) injection  40 mg Subcutaneous Q24H   FLUoxetine   20 mg Oral Daily   levothyroxine   50 mcg Oral QAC breakfast   pantoprazole   40 mg Oral BID AC   Continuous Infusions:  sodium chloride  75 mL/hr at 08/03/24 1211     LOS: 0 days    Time spent: 35 minutes    Ethelean Colla Maree, MD [facility] Pager [phone]  If 7PM-7AM, please contact night-coverage [email or web address]  08/03/2024, 1:12 PM

## 2024-08-03 NOTE — Progress Notes (Signed)
 Mobility Specialist - Progress Note   08/03/24 1455  Mobility  Activity Ambulated with assistance  Level of Assistance Standby assist, set-up cues, supervision of patient - no hands on  Assistive Device Front wheel walker  Distance Ambulated (ft) 40 ft  Activity Response Tolerated well  Mobility visit 1 Mobility  Mobility Specialist Start Time (ACUTE ONLY) 1406  Mobility Specialist Stop Time (ACUTE ONLY) 1419  Mobility Specialist Time Calculation (min) (ACUTE ONLY) 13 min   Pt sitting in the recliner upon entry, utilizing RA. Pt informs MS of vomit episode this morning. Pt STS to RW MinG, no hands on assist required. Pt amb to the NS and back with SBA, expressed feeling well during amb. Pt returned to the room, left seated in the recliner with alarm set and needs within reach.  America Silvan Mobility Specialist 08/03/24 2:58 PM

## 2024-08-03 NOTE — Consult Note (Signed)
 Cindy JONELLE Brooklyn, MD [address]  [place], [place] [address]  Main: [phone] Fax:  [phone] Pager: [phone]   Consultation  Referring Provider:     No ref. provider found Primary Care Physician:  Glover [name], MD Primary Gastroenterologist:  Dr.  Aundria         Reason for Consultation: Intractable nausea, vomiting  Date of Admission:  08/01/2024 Date of Consultation:  08/03/2024         HPI:   Cindy Huerta is a 78 y.o. female with history of dementia is admitted with left-sided abdominal pain, nausea and vomiting.  She has history of anxiety.  Patient was recently admitted in July secondary to nausea and vomiting, underwent EGD which was unremarkable although gastric biopsies were not performed.  Patient is started on as needed metoclopramide  along with other antiemetics.  When I interviewed the patient, she was sitting up in chair, appeared anxious, involuntary tremors in her hands and face.  She said she had threw up gastric contents which were mucousy last night.  She points her hand to left mid quadrant area that it is tender.  She feels gassy and bloated.  She reports not having a bowel movement but passing gas.  I have offered her to try pudding and juice, she had pudding and she was okay.  She was also experiencing dry heaving.  Has normocytic anemia, normal iron  studies.  Has folate deficiency.  MRI brain revealed moderate chronic microvascular ischemic disease.  She underwent CT abdomen pelvis with contrast with no acute intra-abdominal pathology identified.  She had multiple cross-sectional imaging within last few months which were unremarkable.   NSAIDs: None  Antiplts/Anticoagulants/Anti thrombotics: None  GI Procedures: EGD 06/2024 normal  Past Medical History:  Diagnosis Date   Acute kidney injury superimposed on chronic kidney disease (HCC) 09/13/2023   Acute metabolic encephalopathy 08/09/2023   Acute renal failure superimposed on stage 3b chronic  kidney disease (HCC) 06/28/2022   AKI (acute kidney injury) (HCC) 08/09/2023   Arthritis    Breast cancer (HCC)    Depression    Elevated lactic acid level 08/17/2022   GERD (gastroesophageal reflux disease)    GIB (gastrointestinal bleeding) 10/06/2021   HLD (hyperlipidemia)    Hypertension    Hypokalemia 06/18/2022   Hyponatremia 06/18/2022   Hypothyroidism    Intractable vomiting 06/26/2024   Sepsis (HCC) 09/14/2023   UTI (urinary tract infection) 08/17/2022    Past Surgical History:  Procedure Laterality Date   ABDOMINAL HYSTERECTOMY  1982   AXILLARY SENTINEL NODE BIOPSY Left 11/20/2021   Procedure: AXILLARY SENTINEL NODE BIOPSY;  Surgeon: Tye Millet, DO;  Location: [facility] ORS;  Service: General;  Laterality: Left;   BREAST BIOPSY Left 09/23/2021   3:00 13cmfn venus marker, path pending   BREAST BIOPSY Left 09/23/2021   3:00 14 cmfn heart marker, path pending   CHOLECYSTECTOMY     ESOPHAGOGASTRODUODENOSCOPY N/A 06/27/2024   Procedure: EGD (ESOPHAGOGASTRODUODENOSCOPY);  Surgeon: Jinny Carmine, MD;  Location: [facility] ENDOSCOPY;  Service: Endoscopy;  Laterality: N/A;   ESOPHAGOGASTRODUODENOSCOPY (EGD) WITH PROPOFOL  N/A 10/08/2021   Procedure: ESOPHAGOGASTRODUODENOSCOPY (EGD) WITH PROPOFOL ;  Surgeon: Therisa Bi, MD;  Location: [facility] ENDOSCOPY;  Service: Gastroenterology;  Laterality: N/A;   HEMATOMA EVACUATION Left 11/21/2021   Procedure: EVACUATION HEMATOMA;  Surgeon: Tye Millet, DO;  Location: [facility] ORS;  Service: General;  Laterality: Left;   IR FLUORO GUIDE CV LINE LEFT  08/17/2022   IR US  GUIDE VASC ACCESS LEFT  08/17/2022  IR US  GUIDE VASC ACCESS RIGHT  08/17/2022   left lumpectomy  2019   TOTAL MASTECTOMY Left 11/20/2021   Procedure: TOTAL MASTECTOMY;  Surgeon: Tye Millet, DO;  Location: [facility] ORS;  Service: General;  Laterality: Left;     Current Facility-Administered Medications:    0.9 %  sodium chloride  infusion, , Intravenous, Continuous, Maree Hue, MD, Last Rate:  75 mL/hr at 08/03/24 1211, New Bag at 08/03/24 1211   acetaminophen  (TYLENOL ) tablet 650 mg, 650 mg, Oral, Q6H PRN, 650 mg at 08/03/24 0818 **OR** acetaminophen  (TYLENOL ) suppository 650 mg, 650 mg, Rectal, Q6H PRN, Paudel, Keshab, MD   amLODipine  (NORVASC ) tablet 10 mg, 10 mg, Oral, Daily, Paudel, Keshab, MD, 10 mg at 08/03/24 0952   artificial tears ophthalmic solution 1 drop, 1 drop, Both Eyes, Daily PRN, Paudel, Keshab, MD   aspirin  EC tablet 81 mg, 81 mg, Oral, Daily, Paudel, Keshab, MD, 81 mg at 08/03/24 9047   enoxaparin  (LOVENOX ) injection 40 mg, 40 mg, Subcutaneous, Q24H, Paudel, Keshab, MD, 40 mg at 08/03/24 9047   FLUoxetine  (PROZAC ) capsule 20 mg, 20 mg, Oral, Daily, Paudel, Keshab, MD, 20 mg at 08/03/24 9047   folic acid  (FOLVITE ) tablet 1 mg, 1 mg, Oral, Daily, Mike Berntsen Reddy, MD   levothyroxine  (SYNTHROID ) tablet 50 mcg, 50 mcg, Oral, QAC breakfast, Paudel, Keshab, MD, 50 mcg at 08/03/24 9370   LORazepam  (ATIVAN ) tablet 0.5 mg, 0.5 mg, Oral, BID PRN, Paudel, Keshab, MD, 0.5 mg at 08/03/24 0818   metoCLOPramide  (REGLAN ) injection 10 mg, 10 mg, Intravenous, Q6H PRN, Paudel, Keshab, MD, 10 mg at 08/03/24 9182   pantoprazole  (PROTONIX ) EC tablet 40 mg, 40 mg, Oral, BID AC, Shah, Vipul, MD   polyethylene glycol (MIRALAX  / GLYCOLAX ) packet 17 g, 17 g, Oral, Daily PRN, Paudel, Keshab, MD   Family History  Problem Relation Age of Onset   Heart disease Mother    Cancer Paternal Grandmother      Social History   Tobacco Use   Smoking status: Never   Smokeless tobacco: Never  Vaping Use   Vaping status: Never Used  Substance Use Topics   Alcohol  use: Never   Drug use: Never    Allergies as of 08/01/2024 - Review Complete 08/01/2024  Allergen Reaction Noted   Latex Rash 04/25/2019   Novocain [procaine] Other (See Comments) 07/10/2013   Zestril [lisinopril] Cough 12/13/2018   Benadryl  [diphenhydramine ] Other (See Comments) 07/27/2022   Biaxin [clarithromycin] Other (See  Comments) 07/10/2013   Roxicodone [oxycodone] Nausea And Vomiting and Anxiety 02/02/2021    Review of Systems:    All systems reviewed and negative except where noted in HPI.   Physical Exam:  Vital signs in last 24 hours: Temp:  [98.2 F (36.8 C)-99.2 F (37.3 C)] 99.2 F (37.3 C) (08/29 0856) Pulse Rate:  [78-89] 78 (08/29 0856) Resp:  [16-19] 16 (08/29 0856) BP: (138-163)/(61-83) 163/77 (08/29 0856) SpO2:  [100 %] 100 % (08/29 0856) Last BM Date : 08/02/24 General:   Alert,  Well-developed, well-nourished, pleasant and cooperative in NAD Eyes:  Sclera clear, no icterus.   Conjunctiva pink. Lungs:  Respirations even and unlabored.  Clear throughout to auscultation.   No wheezes, crackles, or rhonchi. No acute distress. Heart:  Regular rate and rhythm; no murmurs, clicks, rubs, or gallops. Abdomen:  Normal bowel sounds. Soft, non-tender and non-distended without masses, hepatosplenomegaly or hernias noted.  No guarding or rebound tenderness.   Rectal: Not performed Extremities:  No clubbing or edema.  No  cyanosis. Neurologic:  Alert and oriented x2, involuntary tremors in her hands and face Skin:  Intact without significant lesions or rashes. No jaundice. Psych:  Alert and cooperative. Normal mood and affect.  LAB RESULTS:    Latest Ref Rng & Units 08/03/2024    5:48 AM 08/02/2024    5:13 AM 08/01/2024    1:54 AM  CBC  WBC 4.0 - 10.5 K/uL 11.2  10.6  16.2   Hemoglobin 12.0 - 15.0 g/dL 9.6  9.5  88.7   Hematocrit 36.0 - 46.0 % 28.5  28.8  32.4   Platelets 150 - 400 K/uL 217  201  249     BMET    Latest Ref Rng & Units 08/03/2024    5:48 AM 08/02/2024    5:13 AM 08/01/2024    1:54 AM  BMP  Glucose 70 - 99 mg/dL 898  899  832   BUN 8 - 23 mg/dL 19  17  16    Creatinine 0.44 - 1.00 mg/dL 8.96  8.78  8.72   Sodium 135 - 145 mmol/L 136  136  131   Potassium 3.5 - 5.1 mmol/L 3.5  4.2  3.1   Chloride 98 - 111 mmol/L 99  102  95   CO2 22 - 32 mmol/L 24  27  21    Calcium  8.9 -  10.3 mg/dL 9.3  9.1  9.7     LFT    Latest Ref Rng & Units 08/02/2024    5:13 AM 08/01/2024    1:54 AM 07/21/2024    5:38 PM  Hepatic Function  Total Protein 6.5 - 8.1 g/dL 7.3  8.1  7.8   Albumin 3.5 - 5.0 g/dL 3.4  4.0  4.0   AST 15 - 41 U/L 25  35  27   ALT 0 - 44 U/L 12  14  13    Alk Phosphatase 38 - 126 U/L 47  58  49   Total Bilirubin 0.0 - 1.2 mg/dL 0.5  0.5  0.8      STUDIES: No results found.     Impression / Plan:   Cindy Huerta is a 78 y.o. female with dementia, hypertension is seen in consultation for intractable nausea and vomiting of unclear etiology.  CT abdomen and pelvis with contrast was unremarkable.  EGD was unremarkable  Recommend H. pylori stool antigen test Cannot perform gastric emptying study as inpatient Recommend scheduled Zofran  alternating with Phenergan  every 6-8 hours Okay to stop metoclopramide  Can start low-dose Zyprexa  at bedtime given her history of underlying anxiety Palliative care has been consulted for hospice evaluation Encourage diet as tolerated No further workup is recommended at this time Regular bowel regimen to avoid constipation  Thank you for involving me in the care of this patient.  GI will sign off at this time, please call us  back with questions or concerns    LOS: 0 days   Cindy Brooklyn, MD  08/03/2024, 2:17 PM    Note: This dictation was prepared with Dragon dictation along with smaller phrase technology. Any transcriptional errors that result from this process are unintentional.

## 2024-08-03 NOTE — Plan of Care (Signed)
  Problem: Clinical Measurements: Goal: Will remain free from infection Outcome: Progressing Goal: Respiratory complications will improve Outcome: Progressing Goal: Cardiovascular complication will be avoided Outcome: Progressing   Problem: Activity: Goal: Risk for activity intolerance will decrease Outcome: Progressing   Problem: Elimination: Goal: Will not experience complications related to urinary retention Outcome: Progressing   Problem: Pain Managment: Goal: General experience of comfort will improve and/or be controlled Outcome: Progressing   Problem: Safety: Goal: Ability to remain free from injury will improve Outcome: Progressing   Problem: Skin Integrity: Goal: Risk for impaired skin integrity will decrease Outcome: Progressing

## 2024-08-04 DIAGNOSIS — R42 Dizziness and giddiness: Secondary | ICD-10-CM | POA: Diagnosis not present

## 2024-08-04 DIAGNOSIS — R9431 Abnormal electrocardiogram [ECG] [EKG]: Secondary | ICD-10-CM | POA: Diagnosis not present

## 2024-08-04 DIAGNOSIS — Z515 Encounter for palliative care: Secondary | ICD-10-CM

## 2024-08-04 DIAGNOSIS — R112 Nausea with vomiting, unspecified: Secondary | ICD-10-CM | POA: Diagnosis not present

## 2024-08-04 LAB — CBC
HCT: 32.9 % — ABNORMAL LOW (ref 36.0–46.0)
Hemoglobin: 10.9 g/dL — ABNORMAL LOW (ref 12.0–15.0)
MCH: 30.4 pg (ref 26.0–34.0)
MCHC: 33.1 g/dL (ref 30.0–36.0)
MCV: 91.6 fL (ref 80.0–100.0)
Platelets: 206 K/uL (ref 150–400)
RBC: 3.59 MIL/uL — ABNORMAL LOW (ref 3.87–5.11)
RDW: 13.5 % (ref 11.5–15.5)
WBC: 10.6 K/uL — ABNORMAL HIGH (ref 4.0–10.5)
nRBC: 0 % (ref 0.0–0.2)

## 2024-08-04 LAB — BASIC METABOLIC PANEL WITH GFR
Anion gap: 9 (ref 5–15)
BUN: 14 mg/dL (ref 8–23)
CO2: 24 mmol/L (ref 22–32)
Calcium: 9.4 mg/dL (ref 8.9–10.3)
Chloride: 105 mmol/L (ref 98–111)
Creatinine, Ser: 0.77 mg/dL (ref 0.44–1.00)
GFR, Estimated: >60 mL/min (ref >60)
Glucose, Bld: 96 mg/dL (ref 70–99)
Potassium: 3.1 mmol/L — ABNORMAL LOW (ref 3.5–5.1)
Sodium: 138 mmol/L (ref 135–145)

## 2024-08-04 MED ORDER — LORAZEPAM 0.5 MG PO TABS: 0.5000 mg | ORAL_TABLET | Freq: Four times a day (QID) | ORAL | 0 refills | Status: AC | PRN

## 2024-08-04 MED ORDER — POTASSIUM CHLORIDE CRYS ER 20 MEQ PO TBCR
40.0000 meq | EXTENDED_RELEASE_TABLET | Freq: Once | ORAL | Status: AC
  Administered 2024-08-04: 40 meq via ORAL
  Filled 2024-08-04: qty 2

## 2024-08-04 NOTE — Discharge Summary (Signed)
 Physician Discharge Summary   Patient: Cindy Huerta MRN: [id number] DOB: 1946-01-19  Admit date:     08/01/2024  Discharge date: 08/04/24  Discharge Physician: Cresencio Fairly   PCP: Glover [name], MD   Recommendations at discharge:    Hospice at Home  Discharge Diagnoses: Principal Problem:   Intractable nausea and vomiting Active Problems:   Cyclical vomiting with nausea   HTN (hypertension)   QT prolongation   Depression with anxiety   History of TIA (transient ischemic attack)   HLD (hyperlipidemia)   DNR (do not resuscitate)   Goals of care, counseling/discussion   Hospice care patient  Hospital Course: Assessment and Plan:  78 y.o. female with medical history significant for recurrent vertigo with negative workup with last admission being 07/05/2024 and she attended ED on 07/21/2024 for similar complaints.  She has chronic medical problems including but not limited to HTN, HLD, hypothyroidism, history of breast cancer, GERD, dementia, history of TIA, heart failure, history of anxiety.  She was diagnosed with possible orthostatic hypotension and polypharmacy on end of July.   8/28: PT and OT eval.  Hospice evaluation 8/29: reports 3 vomits overnight   1.  Dizziness/vertigo/nausea/vomiting - It appears that this has been ongoing for the last few months. GI seen and recommends conservative mgmt. No endoscopy planned. - Workup is negative including CT scan of the brain, urinalysis - Due to persistent dizziness vertigo nausea vomiting, MRI of the brain was performed which showed no acute intracranial abnormality   2.  Deconditioning 3.  HTN Hospice now.   4.  Hypokalemia - Repleted   5.  Leukocytosis - Patient does not have fever, UA is negative - Neg 2 sets of blood cultures - Likely noninfectious etiology.  Hold off antibiotics   6. Dementia without behavioral disturbances Likely at baseline, plan for Hospice at DC           Consultants:  GI  Disposition: Hospice care Diet recommendation:  Discharge Diet Orders (From admission, onward)     Start     Ordered   08/04/24 0000  Diet - low sodium heart healthy        08/04/24 1012           Regular diet DISCHARGE MEDICATION: Allergies as of 08/04/2024       Reactions   Latex Rash   Novocain [procaine] Other (See Comments)   Unsure - told by DDS not to let anyone give it to her  Confusion   Zestril [lisinopril] Cough   Benadryl  [diphenhydramine ] Other (See Comments)   Jitteriness Agitation   Biaxin [clarithromycin] Other (See Comments)   Confusion   Roxicodone [oxycodone] Nausea And Vomiting, Anxiety        Medication List     STOP taking these medications    Eliquis  5 MG Tabs tablet Generic drug: apixaban        TAKE these medications    acetaminophen  500 MG tablet Commonly known as: TYLENOL  Take 500-1,000 mg by mouth daily as needed for moderate pain (pain score 4-6) or mild pain (pain score 1-3).   amLODipine  10 MG tablet Commonly known as: NORVASC  Take 1 tablet (10 mg total) by mouth daily.   Artificial Tears 0.1-0.3 % Soln Generic drug: Dextran 70-Hypromellose Place 1 drop into both eyes daily as needed for dry eyes.   aspirin  EC 81 MG tablet Take 1 tablet (81 mg total) by mouth daily. RESTART 48HRS AFTER DISCHARGE What changed:  when to take this additional instructions  atorvastatin  80 MG tablet Commonly known as: LIPITOR  Take 80 mg by mouth daily.   feeding supplement Liqd Take 237 mLs by mouth 2 (two) times daily between meals.   ferrous sulfate  325 (65 FE) MG EC tablet Take 325 mg by mouth at bedtime.   FLUoxetine  20 MG capsule Commonly known as: PROZAC  Take 20 mg by mouth daily.   fluticasone  50 MCG/ACT nasal spray Commonly known as: FLONASE  Place 1 spray into both nostrils daily as needed for allergies or rhinitis.   levothyroxine  50 MCG tablet Commonly known as: SYNTHROID  Take 50 mcg by mouth daily before  breakfast.   LORazepam  0.5 MG tablet Commonly known as: ATIVAN  Take 1 tablet (0.5 mg total) by mouth every 6 (six) hours as needed for up to 3 days for anxiety (shaking). What changed: when to take this   ondansetron  4 MG disintegrating tablet Commonly known as: ZOFRAN -ODT Take 1 tablet (4 mg total) by mouth every 8 (eight) hours as needed for nausea or vomiting.   pantoprazole  40 MG tablet Commonly known as: PROTONIX  Take 1 tablet (40 mg total) by mouth daily.   polyethylene glycol powder 17 GM/SCOOP powder Commonly known as: MiraLax  Take 17 g by mouth 2 (two) times daily as needed for moderate constipation.   senna-docusate 8.6-50 MG tablet Commonly known as: Senokot-S Take 1 tablet by mouth 2 (two) times daily between meals as needed for mild constipation.   telmisartan  80 MG tablet Commonly known as: MICARDIS  Take 80 mg by mouth daily.   trolamine salicylate 10 % cream Commonly known as: ASPERCREME Apply 1 Application topically as needed for muscle pain.   VITAMIN D -3 PO Take 1 capsule by mouth at bedtime.        Follow-up Information     Glover [name], MD Follow up.   Specialty: Family Medicine Why: hospital follow up Contact information: [address] [place] [address] [phone]                Discharge Exam: There were no vitals filed for this visit. General exam: Appears calm and comfortable  Respiratory system: Clear to auscultation. Respiratory effort normal. Cardiovascular system: S1 & S2 heard, RRR. No JVD, murmurs, rubs, gallops or clicks. No pedal edema. Gastrointestinal system: Abdomen is soft, benign Central nervous system: Alert and oriented. No focal neurological deficits. Extremities: Symmetric 5 x 5 power. Skin: No rashes, lesions or ulcers Psychiatry: Judgement and insight appear normal. Mood & affect appropriate.   Condition at discharge: fair  The results of significant diagnostics from this hospitalization  (including imaging, microbiology, ancillary and laboratory) are listed below for reference.   Imaging Studies: MR BRAIN WO CONTRAST Result Date: 08/01/2024 CLINICAL DATA:  Initial evaluation for acute ataxia. EXAM: MRI HEAD WITHOUT CONTRAST TECHNIQUE: Multiplanar, multiecho pulse sequences of the brain and surrounding structures were obtained without intravenous contrast. COMPARISON:  Prior CT from earlier the same day as well as previous MRI from 05/28/2024 FINDINGS: Brain: Cerebral volume within normal limits for age. Patchy and confluent T2/FLAIR hyperintensity involving the periventricular and deep white matter both cerebral hemispheres as well as the pons, consistent with chronic small vessel ischemic disease, moderate in nature. No abnormal foci of restricted diffusion to suggest acute or subacute ischemia. Apparent subtle focus of diffusion signal abnormality overlying the left frontal convexity on axial DWI image 42 noted, felt to be artifactual in nature. Gray-white matter is a shin maintained. No areas of chronic cortical infarction. No acute or chronic intracranial  blood products. No mass lesion, midline shift or mass effect. No hydrocephalus or extra-axial fluid collection. Pituitary gland within normal limits. Vascular: Major intracranial vascular flow voids are maintained. Skull and upper cervical spine: Craniocervical junction within normal limits. Bone marrow signal intensity normal. No scalp soft tissue abnormality. Sinuses/Orbits: Globes orbital soft tissues within normal limits. Paranasal sinuses are clear. No significant mastoid effusion. Other: None. IMPRESSION: 1. Stable brain MRI.  No acute intracranial abnormality. 2. Moderate chronic microvascular ischemic disease. Electronically Signed   By: Morene Hoard M.D.   On: 08/01/2024 18:34   CT ABDOMEN PELVIS W CONTRAST Result Date: 08/01/2024 CLINICAL DATA:  Acute abdominal pain EXAM: CT ABDOMEN AND PELVIS WITH CONTRAST TECHNIQUE:  Multidetector CT imaging of the abdomen and pelvis was performed using the standard protocol following bolus administration of intravenous contrast. RADIATION DOSE REDUCTION: This exam was performed according to the departmental dose-optimization program which includes automated exposure control, adjustment of the mA and/or kV according to patient size and/or use of iterative reconstruction technique. CONTRAST:  80mL OMNIPAQUE  IOHEXOL  350 MG/ML SOLN COMPARISON:  06/25/2024 FINDINGS: Lower chest: No acute abnormality. Hepatobiliary: No focal liver abnormality is seen. Status post cholecystectomy. No biliary dilatation. Pancreas: Unremarkable. No pancreatic ductal dilatation or surrounding inflammatory changes. Spleen: Normal in size without focal abnormality. Adrenals/Urinary Tract: Adrenal glands are within normal limits. Kidneys demonstrate no renal calculi or obstructive changes. Bladder is within normal limits. Stomach/Bowel: Scattered diverticular change of the colon is noted without evidence of diverticulitis. Stomach and small bowel are within normal limits. The appendix is not well visualized and may have been surgically removed. No inflammatory changes to suggest appendicitis are noted. Vascular/Lymphatic: Aortic atherosclerosis. No enlarged abdominal or pelvic lymph nodes. Reproductive: Status post hysterectomy. No adnexal masses. Other: No abdominal wall hernia or abnormality. No abdominopelvic ascites. Musculoskeletal: Degenerative changes of the lumbar spine are noted. IMPRESSION: Diverticulosis without diverticulitis. Resolution of previously seen right lower lobe infiltrate. No acute abnormality noted. Electronically Signed   By: Oneil Devonshire M.D.   On: 08/01/2024 03:23   CT HEAD WO CONTRAST ( ) Result Date: 08/01/2024 CLINICAL DATA:  Headaches and vertigo symptoms EXAM: CT HEAD WITHOUT CONTRAST TECHNIQUE: Contiguous axial images were obtained from the base of the skull through the vertex without  intravenous contrast. RADIATION DOSE REDUCTION: This exam was performed according to the departmental dose-optimization program which includes automated exposure control, adjustment of the mA and/or kV according to patient size and/or use of iterative reconstruction technique. COMPARISON:  07/21/2024 FINDINGS: Brain: No evidence of acute infarction, hemorrhage, hydrocephalus, extra-axial collection or mass lesion/mass effect. Chronic white matter ischemic changes are noted. Vascular: No hyperdense vessel or unexpected calcification. Skull: Normal. Negative for fracture or focal lesion. Sinuses/Orbits: No acute finding. Other: None. IMPRESSION: Chronic white matter ischemic changes without acute abnormality. Electronically Signed   By: Oneil Devonshire M.D.   On: 08/01/2024 03:18   CT Cervical Spine Wo Contrast Result Date: 07/21/2024 CLINICAL DATA:  Neck trauma EXAM: CT CERVICAL SPINE WITHOUT CONTRAST TECHNIQUE: Multidetector CT imaging of the cervical spine was performed without intravenous contrast. Multiplanar CT image reconstructions were also generated. RADIATION DOSE REDUCTION: This exam was performed according to the departmental dose-optimization program which includes automated exposure control, adjustment of the mA and/or kV according to patient size and/or use of iterative reconstruction technique. COMPARISON:  04/29/2024 FINDINGS: Alignment: Alignment is grossly anatomic. Skull base and vertebrae: No acute fracture. No primary bone lesion or focal pathologic process. Soft tissues and spinal canal: No  prevertebral fluid or swelling. No visible canal hematoma. Disc levels: Stable multilevel spondylosis stage, most pronounced at C4-5, C5-6, and C6-7. Stable ossification along the inter spinous ligament. Upper chest: Airway is patent.  Lung apices are clear. Other: Reconstructed images demonstrate no additional findings. IMPRESSION: 1. No acute cervical spine fracture. 2. Stable multilevel cervical  degenerative changes. Electronically Signed   By: Ozell Daring M.D.   On: 07/21/2024 17:45   CT Head Wo Contrast Result Date: 07/21/2024 CLINICAL DATA:  Clemens, head trauma EXAM: CT HEAD WITHOUT CONTRAST TECHNIQUE: Contiguous axial images were obtained from the base of the skull through the vertex without intravenous contrast. RADIATION DOSE REDUCTION: This exam was performed according to the departmental dose-optimization program which includes automated exposure control, adjustment of the mA and/or kV according to patient size and/or use of iterative reconstruction technique. COMPARISON:  07/05/2024 FINDINGS: Brain: No acute infarct or hemorrhage. Stable chronic small vessel ischemic changes within the periventricular white matter and bilateral basal ganglia. Lateral ventricles and remaining midline structures are unremarkable. No acute extra-axial fluid collections. No mass effect. Vascular: Stable atherosclerosis.  No hyperdense vessel. Skull: Normal. Negative for fracture or focal lesion. Sinuses/Orbits: No acute finding. Other: None. IMPRESSION: 1. Stable head CT, no acute intracranial process. Electronically Signed   By: Ozell Daring M.D.   On: 07/21/2024 17:43   DG Knee Complete 4 Views Right Result Date: 07/21/2024 CLINICAL DATA:  Clemens, pain EXAM: RIGHT KNEE - COMPLETE 4+ VIEW COMPARISON:  12/14/2021 FINDINGS: Frontal, bilateral oblique, and cross-table lateral views of the right knee are obtained. There is severe 3 compartmental osteoarthritis with joint space narrowing and osteophyte formation most pronounced in the medial and patellofemoral compartments. No fracture, subluxation, or dislocation. No joint effusion. Soft tissues are unremarkable. IMPRESSION: 1. No acute displaced fracture. 2. Severe 3 compartmental osteoarthritis greatest in the medial and patellofemoral compartments. Electronically Signed   By: Ozell Daring M.D.   On: 07/21/2024 17:28   DG Ankle 2 Views Right Result Date:  07/21/2024 CLINICAL DATA:  Right ankle pain, fell EXAM: RIGHT ANKLE - 2 VIEW COMPARISON:  None Available. FINDINGS: Frontal and lateral views of the right ankle are obtained. No fracture, subluxation, or dislocation. Mild osteoarthritis of the ankle and hindfoot. Prominent superior and inferior calcaneal spurs. Soft tissues are unremarkable. IMPRESSION: 1. Mild osteoarthritis.  No acute displaced fracture. Electronically Signed   By: Ozell Daring M.D.   On: 07/21/2024 17:27    Microbiology: Results for orders placed or performed during the hospital encounter of 08/01/24  Culture, blood (Routine X 2) w Reflex to ID Panel     Status: None (Preliminary result)   Collection Time: 08/01/24  4:32 PM   Specimen: BLOOD  Result Value Ref Range Status   Specimen Description BLOOD BLOOD LEFT HAND  Final   Special Requests   Final    BOTTLES DRAWN AEROBIC AND ANAEROBIC Blood Culture adequate volume   Culture   Final    NO GROWTH 3 DAYS Performed at [facility], [address], [place], [place] [address]    Report Status PENDING  Incomplete  Culture, blood (Routine X 2) w Reflex to ID Panel     Status: None (Preliminary result)   Collection Time: 08/01/24  5:10 PM   Specimen: BLOOD  Result Value Ref Range Status   Specimen Description BLOOD BLOOD RIGHT HAND  Final   Special Requests   Final    BOTTLES DRAWN AEROBIC AND ANAEROBIC Blood Culture results may  not be optimal due to an inadequate volume of blood received in culture bottles   Culture   Final    NO GROWTH 3 DAYS Performed at [facility], [address] [place], [place] [address]    Report Status PENDING  Incomplete    Labs: CBC: Recent Labs  Lab 08/01/24 0154 08/02/24 0513 08/03/24 0548 08/04/24 0428  WBC 16.2* 10.6* 11.2* 10.6*  HGB 11.2* 9.5* 9.6* 10.9*  HCT 32.4* 28.8* 28.5* 32.9*  MCV 88.5 91.7 90.2 91.6  PLT 249 201 217 206   Basic Metabolic Panel: Recent Labs  Lab 08/01/24 0154 08/01/24 1632  08/02/24 0513 08/03/24 0548 08/04/24 0428  NA 131*  --  136 136 138  K 3.1*  --  4.2 3.5 3.1*  CL 95*  --  102 99 105  CO2 21*  --  27 24 24   GLUCOSE 167*  --  100* 101* 96  BUN 16  --  17 19 14   CREATININE 1.27*  --  1.21* 1.03* 0.77  CALCIUM  9.7  --  9.1 9.3 9.4  MG  --  1.6* 1.6*  --   --    Liver Function Tests: Recent Labs  Lab 08/01/24 0154 08/02/24 0513  AST 35 25  ALT 14 12  ALKPHOS 58 47  BILITOT 0.5 0.5  PROT 8.1 7.3  ALBUMIN 4.0 3.4*   CBG: No results for input(s): GLUCAP in the last 168 hours.  Discharge time spent: greater than 30 minutes.  Signed: Cresencio Fairly, MD [facility] 08/04/2024

## 2024-08-04 NOTE — Progress Notes (Signed)
 Patient is alert and oriented X 3. Discharge instruction given to pt and her son Marcey. No any questions at this time. Peripheral I/v removed.

## 2024-08-04 NOTE — Consult Note (Signed)
 PHARMACY CONSULT NOTE - FOLLOW UP  Pharmacy Consult for Electrolyte Monitoring and Replacement   Recent Labs: Potassium (mmol/L)  Date Value  08/04/2024 3.1 (L)   Magnesium  (mg/dL)  Date Value  91/71/7974 1.6 (L)   Calcium  (mg/dL)  Date Value  91/69/7974 9.4   Albumin (g/dL)  Date Value  91/71/7974 3.4 (L)   Phosphorus (mg/dL)  Date Value  91/95/7974 3.9   Sodium (mmol/L)  Date Value  08/04/2024 138     Assessment: 78 y.o. female with medical history significant for recurrent vertigo. Patient was doing okay until yesterday at 6 PM when she started having dizziness and not able to walk around later she started becoming nauseated and vomited.   NS @ 75 ml/hr x 24 hours.   Goal of Therapy:  WNL  Plan:  Medical team ordered Kcl 40 mEq x 1 F/u with AM labs.   Cathaleen GORMAN Blanch ,PharmD Clinical Pharmacist 08/04/2024 7:39 AM

## 2024-08-04 NOTE — Progress Notes (Signed)
 Mobility Specialist - Progress Note   08/04/24 1119  Mobility  Activity Ambulated with assistance  Level of Assistance Standby assist, set-up cues, supervision of patient - no hands on  Assistive Device Front wheel walker  Distance Ambulated (ft) 160 ft  Activity Response Tolerated well  Mobility visit 1 Mobility  Mobility Specialist Start Time (ACUTE ONLY) 1046  Mobility Specialist Stop Time (ACUTE ONLY) 1057  Mobility Specialist Time Calculation (min) (ACUTE ONLY) 11 min   Pt sitting in the recliner upon entry, utilizing RA. Pt motivated and agreeable to amb this date. Pt STS to RW and amb one lap around the NS with close supervision--- denies SOB, dizziness or nausea. Pt returned to the room, left seated in the recliner with alarm set and needs within reach.  America Silvan Mobility Specialist 08/04/24 11:22 AM

## 2024-08-06 LAB — CULTURE, BLOOD (ROUTINE X 2)
Culture: NO GROWTH
Culture: NO GROWTH
Special Requests: ADEQUATE

## 2024-11-05 DEATH — deceased
